# Patient Record
Sex: Female | Born: 1935 | Race: Black or African American | Hispanic: No | Smoking: Former smoker
Health system: Southern US, Community
[De-identification: ages and names within clinical notes are randomized; demographics above are authoritative.]

## PROBLEM LIST (undated history)

## (undated) DIAGNOSIS — K31819 Angiodysplasia of stomach and duodenum without bleeding: Secondary | ICD-10-CM

## (undated) DIAGNOSIS — Q2730 Arteriovenous malformation, site unspecified: Secondary | ICD-10-CM

## (undated) DIAGNOSIS — N3020 Other chronic cystitis without hematuria: Secondary | ICD-10-CM

## (undated) DIAGNOSIS — Z87448 Personal history of other diseases of urinary system: Secondary | ICD-10-CM

## (undated) DIAGNOSIS — M1990 Unspecified osteoarthritis, unspecified site: Secondary | ICD-10-CM

## (undated) DIAGNOSIS — N319 Neuromuscular dysfunction of bladder, unspecified: Secondary | ICD-10-CM

## (undated) DIAGNOSIS — D500 Iron deficiency anemia secondary to blood loss (chronic): Secondary | ICD-10-CM

## (undated) DIAGNOSIS — E559 Vitamin D deficiency, unspecified: Secondary | ICD-10-CM

## (undated) DIAGNOSIS — H01009 Unspecified blepharitis unspecified eye, unspecified eyelid: Secondary | ICD-10-CM

## (undated) DIAGNOSIS — G629 Polyneuropathy, unspecified: Secondary | ICD-10-CM

## (undated) DIAGNOSIS — I739 Peripheral vascular disease, unspecified: Secondary | ICD-10-CM

## (undated) DIAGNOSIS — M7061 Trochanteric bursitis, right hip: Secondary | ICD-10-CM

## (undated) DIAGNOSIS — H40009 Preglaucoma, unspecified, unspecified eye: Secondary | ICD-10-CM

## (undated) DIAGNOSIS — D110 Benign neoplasm of parotid gland: Secondary | ICD-10-CM

## (undated) DIAGNOSIS — N179 Acute kidney failure, unspecified: Secondary | ICD-10-CM

## (undated) DIAGNOSIS — K449 Diaphragmatic hernia without obstruction or gangrene: Secondary | ICD-10-CM

## (undated) DIAGNOSIS — K644 Residual hemorrhoidal skin tags: Secondary | ICD-10-CM

## (undated) DIAGNOSIS — M4804 Spinal stenosis, thoracic region: Secondary | ICD-10-CM

## (undated) DIAGNOSIS — M7918 Myalgia, other site: Secondary | ICD-10-CM

## (undated) DIAGNOSIS — N362 Urethral caruncle: Secondary | ICD-10-CM

## (undated) DIAGNOSIS — K8020 Calculus of gallbladder without cholecystitis without obstruction: Secondary | ICD-10-CM

## (undated) DIAGNOSIS — G894 Chronic pain syndrome: Secondary | ICD-10-CM

## (undated) DIAGNOSIS — R339 Retention of urine, unspecified: Secondary | ICD-10-CM

## (undated) DIAGNOSIS — R079 Chest pain, unspecified: Secondary | ICD-10-CM

## (undated) DIAGNOSIS — R600 Localized edema: Secondary | ICD-10-CM

## (undated) DIAGNOSIS — K5900 Constipation, unspecified: Secondary | ICD-10-CM

## (undated) DIAGNOSIS — I498 Other specified cardiac arrhythmias: Secondary | ICD-10-CM

## (undated) DIAGNOSIS — K921 Melena: Secondary | ICD-10-CM

## (undated) DIAGNOSIS — G5730 Lesion of lateral popliteal nerve, unspecified lower limb: Secondary | ICD-10-CM

## (undated) DIAGNOSIS — N39490 Overflow incontinence: Secondary | ICD-10-CM

## (undated) DIAGNOSIS — Z7409 Other reduced mobility: Secondary | ICD-10-CM

## (undated) DIAGNOSIS — R202 Paresthesia of skin: Secondary | ICD-10-CM

## (undated) DIAGNOSIS — M4715 Other spondylosis with myelopathy, thoracolumbar region: Secondary | ICD-10-CM

## (undated) DIAGNOSIS — G4700 Insomnia, unspecified: Secondary | ICD-10-CM

## (undated) DIAGNOSIS — M48061 Spinal stenosis, lumbar region without neurogenic claudication: Secondary | ICD-10-CM

## (undated) DIAGNOSIS — Z8619 Personal history of other infectious and parasitic diseases: Secondary | ICD-10-CM

## (undated) DIAGNOSIS — K274 Chronic or unspecified peptic ulcer, site unspecified, with hemorrhage: Secondary | ICD-10-CM

## (undated) DIAGNOSIS — M170 Bilateral primary osteoarthritis of knee: Secondary | ICD-10-CM

## (undated) DIAGNOSIS — K648 Other hemorrhoids: Secondary | ICD-10-CM

## (undated) DIAGNOSIS — M160 Bilateral primary osteoarthritis of hip: Secondary | ICD-10-CM

## (undated) DIAGNOSIS — R310 Gross hematuria: Secondary | ICD-10-CM

## (undated) DIAGNOSIS — R413 Other amnesia: Secondary | ICD-10-CM

## (undated) DIAGNOSIS — K5520 Angiodysplasia of colon without hemorrhage: Secondary | ICD-10-CM

## (undated) DIAGNOSIS — M159 Polyosteoarthritis, unspecified: Secondary | ICD-10-CM

## (undated) DIAGNOSIS — N814 Uterovaginal prolapse, unspecified: Secondary | ICD-10-CM

## (undated) DIAGNOSIS — Z9181 History of falling: Secondary | ICD-10-CM

## (undated) DIAGNOSIS — K6381 Dieulafoy lesion of intestine: Secondary | ICD-10-CM

## (undated) DIAGNOSIS — G8929 Other chronic pain: Secondary | ICD-10-CM

## (undated) DIAGNOSIS — Z86010 Personal history of colonic polyps: Secondary | ICD-10-CM

## (undated) DIAGNOSIS — D62 Acute posthemorrhagic anemia: Secondary | ICD-10-CM

## (undated) DIAGNOSIS — A048 Other specified bacterial intestinal infections: Secondary | ICD-10-CM

## (undated) DIAGNOSIS — R9389 Abnormal findings on diagnostic imaging of other specified body structures: Secondary | ICD-10-CM

## (undated) DIAGNOSIS — K635 Polyp of colon: Secondary | ICD-10-CM

## (undated) DIAGNOSIS — E669 Obesity, unspecified: Secondary | ICD-10-CM

## (undated) DIAGNOSIS — K602 Anal fissure, unspecified: Secondary | ICD-10-CM

## (undated) DIAGNOSIS — D649 Anemia, unspecified: Secondary | ICD-10-CM

## (undated) DIAGNOSIS — E785 Hyperlipidemia, unspecified: Secondary | ICD-10-CM

## (undated) DIAGNOSIS — N390 Urinary tract infection, site not specified: Secondary | ICD-10-CM

## (undated) DIAGNOSIS — M549 Dorsalgia, unspecified: Secondary | ICD-10-CM

## (undated) DIAGNOSIS — R9431 Abnormal electrocardiogram [ECG] [EKG]: Secondary | ICD-10-CM

## (undated) DIAGNOSIS — I951 Orthostatic hypotension: Secondary | ICD-10-CM

## (undated) DIAGNOSIS — I248 Other forms of acute ischemic heart disease: Secondary | ICD-10-CM

## (undated) DIAGNOSIS — Z8701 Personal history of pneumonia (recurrent): Secondary | ICD-10-CM

## (undated) DIAGNOSIS — M21859 Other specified acquired deformities of unspecified thigh: Secondary | ICD-10-CM

## (undated) DIAGNOSIS — IMO0002 Reserved for concepts with insufficient information to code with codable children: Secondary | ICD-10-CM

## (undated) DIAGNOSIS — A506 Late congenital syphilis, latent: Secondary | ICD-10-CM

## (undated) DIAGNOSIS — M169 Osteoarthritis of hip, unspecified: Secondary | ICD-10-CM

## (undated) DIAGNOSIS — N904 Leukoplakia of vulva: Secondary | ICD-10-CM

## (undated) DIAGNOSIS — K269 Duodenal ulcer, unspecified as acute or chronic, without hemorrhage or perforation: Secondary | ICD-10-CM

## (undated) DIAGNOSIS — G959 Disease of spinal cord, unspecified: Secondary | ICD-10-CM

## (undated) DIAGNOSIS — I455 Other specified heart block: Secondary | ICD-10-CM

## (undated) DIAGNOSIS — I441 Atrioventricular block, second degree: Secondary | ICD-10-CM

## (undated) DIAGNOSIS — Z9289 Personal history of other medical treatment: Secondary | ICD-10-CM

## (undated) DIAGNOSIS — N3946 Mixed incontinence: Secondary | ICD-10-CM

## (undated) DIAGNOSIS — K922 Gastrointestinal hemorrhage, unspecified: Secondary | ICD-10-CM

## (undated) DIAGNOSIS — M47812 Spondylosis without myelopathy or radiculopathy, cervical region: Secondary | ICD-10-CM

## (undated) DIAGNOSIS — M7071 Other bursitis of hip, right hip: Secondary | ICD-10-CM

## (undated) DIAGNOSIS — M7072 Other bursitis of hip, left hip: Secondary | ICD-10-CM

## (undated) DIAGNOSIS — H903 Sensorineural hearing loss, bilateral: Secondary | ICD-10-CM

## (undated) DIAGNOSIS — R001 Bradycardia, unspecified: Secondary | ICD-10-CM

## (undated) DIAGNOSIS — E7800 Pure hypercholesterolemia, unspecified: Secondary | ICD-10-CM

## (undated) DIAGNOSIS — I10 Essential (primary) hypertension: Secondary | ICD-10-CM

## (undated) DIAGNOSIS — K31811 Angiodysplasia of stomach and duodenum with bleeding: Secondary | ICD-10-CM

## (undated) DIAGNOSIS — M5430 Sciatica, unspecified side: Secondary | ICD-10-CM

## (undated) DIAGNOSIS — R42 Dizziness and giddiness: Secondary | ICD-10-CM

## (undated) DIAGNOSIS — H04129 Dry eye syndrome of unspecified lacrimal gland: Secondary | ICD-10-CM

## (undated) DIAGNOSIS — R29898 Other symptoms and signs involving the musculoskeletal system: Secondary | ICD-10-CM

## (undated) HISTORY — DX: Paresthesia of skin: R20.2

## (undated) HISTORY — DX: Reserved for concepts with insufficient information to code with codable children: IMO0002

## (undated) HISTORY — DX: Chest pain, unspecified: R07.9

## (undated) HISTORY — DX: Other specified bacterial intestinal infections: A04.8

## (undated) HISTORY — DX: Leukoplakia of vulva: N90.4

## (undated) HISTORY — DX: Bilateral primary osteoarthritis of hip: M16.0

## (undated) HISTORY — DX: Other forms of acute ischemic heart disease: I24.8

## (undated) HISTORY — DX: Personal history of colonic polyps: Z86.010

## (undated) HISTORY — DX: Peripheral vascular disease, unspecified: I73.9

## (undated) HISTORY — DX: Other bursitis of hip, left hip: M70.72

## (undated) HISTORY — DX: Insomnia, unspecified: G47.00

## (undated) HISTORY — DX: Other specified heart block: I45.5

## (undated) HISTORY — DX: Gross hematuria: R31.0

## (undated) HISTORY — DX: Acute kidney failure, unspecified: N17.9

## (undated) HISTORY — DX: Disease of spinal cord, unspecified: G95.9

## (undated) HISTORY — DX: Angiodysplasia of stomach and duodenum without bleeding: K31.819

## (undated) HISTORY — DX: Spinal stenosis, lumbar region without neurogenic claudication: M48.061

## (undated) HISTORY — PX: CYSTOCELE REPAIR: SHX163

## (undated) HISTORY — DX: Gastrointestinal hemorrhage, unspecified: K92.2

## (undated) HISTORY — PX: RECTOCELE REPAIR: SHX761

## (undated) HISTORY — DX: Obesity, unspecified: E66.9

## (undated) HISTORY — DX: Hyperlipidemia, unspecified: E78.5

## (undated) HISTORY — DX: Urinary tract infection, site not specified: N39.0

## (undated) HISTORY — DX: Personal history of other infectious and parasitic diseases: Z86.19

## (undated) HISTORY — DX: Iron deficiency anemia secondary to blood loss (chronic): D50.0

## (undated) HISTORY — DX: Unspecified blepharitis unspecified eye, unspecified eyelid: H01.009

## (undated) HISTORY — DX: Spinal stenosis, thoracic region: M48.04

## (undated) HISTORY — DX: Other amnesia: R41.3

## (undated) HISTORY — DX: Benign neoplasm of parotid gland: D11.0

## (undated) HISTORY — DX: Personal history of pneumonia (recurrent): Z87.01

## (undated) HISTORY — DX: Duodenal ulcer, unspecified as acute or chronic, without hemorrhage or perforation: K26.9

## (undated) HISTORY — DX: Atrioventricular block, second degree: I44.1

## (undated) HISTORY — DX: Dry eye syndrome of unspecified lacrimal gland: H04.129

## (undated) HISTORY — DX: Anal fissure, unspecified: K60.2

## (undated) HISTORY — DX: Melena: K92.1

## (undated) HISTORY — DX: Retention of urine, unspecified: R33.9

## (undated) HISTORY — DX: Acute posthemorrhagic anemia: D62

## (undated) HISTORY — DX: Bilateral primary osteoarthritis of knee: M17.0

## (undated) HISTORY — DX: Localized edema: R60.0

## (undated) HISTORY — DX: Diaphragmatic hernia without obstruction or gangrene: K44.9

## (undated) HISTORY — DX: Angiodysplasia of stomach and duodenum with bleeding: K31.811

## (undated) HISTORY — PX: LAMINECTOMY: SHX219

## (undated) HISTORY — DX: Lesion of lateral popliteal nerve, unspecified lower limb: G57.30

## (undated) HISTORY — DX: Vitamin D deficiency, unspecified: E55.9

## (undated) HISTORY — DX: Angiodysplasia of colon without hemorrhage: K55.20

## (undated) HISTORY — DX: Polyneuropathy, unspecified: G62.9

## (undated) HISTORY — DX: History of falling: Z91.81

## (undated) HISTORY — DX: Abnormal electrocardiogram (ECG) (EKG): R94.31

## (undated) HISTORY — DX: Dieulafoy lesion of intestine: K63.81

## (undated) HISTORY — DX: Polyosteoarthritis, unspecified: M15.9

## (undated) HISTORY — DX: Overflow incontinence: N39.490

## (undated) HISTORY — DX: Unspecified osteoarthritis, unspecified site: M19.90

## (undated) HISTORY — DX: Urethral caruncle: N36.2

## (undated) HISTORY — DX: Constipation, unspecified: K59.00

## (undated) HISTORY — PX: BLADDER SUSPENSION: SHX72

## (undated) HISTORY — PX: URETHRAL DILATION: SUR417

## (undated) HISTORY — DX: Dorsalgia, unspecified: M54.9

## (undated) HISTORY — DX: Preglaucoma, unspecified, unspecified eye: H40.009

## (undated) HISTORY — DX: Essential (primary) hypertension: I10

## (undated) HISTORY — DX: Spondylosis without myelopathy or radiculopathy, cervical region: M47.812

## (undated) HISTORY — DX: Osteoarthritis of hip, unspecified: M16.9

## (undated) HISTORY — DX: Other specified acquired deformities of unspecified thigh: M21.859

## (undated) HISTORY — DX: Other chronic pain: G89.29

## (undated) HISTORY — DX: Calculus of gallbladder without cholecystitis without obstruction: K80.20

## (undated) HISTORY — DX: Arteriovenous malformation, site unspecified: Q27.30

## (undated) HISTORY — DX: Trochanteric bursitis, right hip: M70.61

## (undated) HISTORY — DX: Other symptoms and signs involving the musculoskeletal system: R29.898

## (undated) HISTORY — DX: Pure hypercholesterolemia, unspecified: E78.00

## (undated) HISTORY — DX: Anemia, unspecified: D64.9

## (undated) HISTORY — DX: Neuromuscular dysfunction of bladder, unspecified: N31.9

## (undated) HISTORY — DX: Residual hemorrhoidal skin tags: K64.4

## (undated) HISTORY — PX: CARPAL TUNNEL RELEASE: SHX101

## (undated) HISTORY — PX: BREAST LUMPECTOMY: SHX2

## (undated) HISTORY — DX: Bradycardia, unspecified: R00.1

## (undated) HISTORY — DX: Abnormal findings on diagnostic imaging of other specified body structures: R93.89

## (undated) HISTORY — DX: Other specified cardiac arrhythmias: I49.8

## (undated) HISTORY — DX: Other reduced mobility: Z74.09

## (undated) HISTORY — DX: Other hemorrhoids: K64.8

## (undated) HISTORY — DX: Chronic or unspecified peptic ulcer, site unspecified, with hemorrhage: K27.4

## (undated) HISTORY — DX: Orthostatic hypotension: I95.1

## (undated) HISTORY — DX: Sensorineural hearing loss, bilateral: H90.3

---
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
## 1962-12-17 HISTORY — PX: TOTAL ABDOMINAL HYSTERECTOMY W/ BILATERAL SALPINGOOPHORECTOMY: SHX83

## 1988-12-17 HISTORY — PX: PAROTID GLAND TUMOR EXCISION: SHX5221

## 1992-12-17 HISTORY — PX: RECONSTRUCTION OF NOSE: SHX2301

## 1998-10-05 ENCOUNTER — Emergency Department (HOSPITAL_COMMUNITY): Admission: EM | Admit: 1998-10-05 | Discharge: 1998-10-05 | Payer: Self-pay | Admitting: Emergency Medicine

## 2000-06-25 ENCOUNTER — Encounter: Admission: RE | Admit: 2000-06-25 | Discharge: 2000-06-25 | Payer: Self-pay | Admitting: *Deleted

## 2000-07-11 ENCOUNTER — Emergency Department (HOSPITAL_COMMUNITY): Admission: EM | Admit: 2000-07-11 | Discharge: 2000-07-11 | Payer: Self-pay | Admitting: Emergency Medicine

## 2000-09-02 ENCOUNTER — Ambulatory Visit (HOSPITAL_COMMUNITY): Admission: RE | Admit: 2000-09-02 | Discharge: 2000-09-02 | Payer: Self-pay | Admitting: Gastroenterology

## 2000-09-02 ENCOUNTER — Encounter (INDEPENDENT_AMBULATORY_CARE_PROVIDER_SITE_OTHER): Payer: Self-pay

## 2001-11-18 ENCOUNTER — Other Ambulatory Visit: Admission: RE | Admit: 2001-11-18 | Discharge: 2001-11-18 | Payer: Self-pay | Admitting: Internal Medicine

## 2001-11-21 ENCOUNTER — Encounter: Admission: RE | Admit: 2001-11-21 | Discharge: 2001-11-21 | Payer: Self-pay | Admitting: Internal Medicine

## 2002-06-29 ENCOUNTER — Encounter: Admission: RE | Admit: 2002-06-29 | Discharge: 2002-06-29 | Payer: Self-pay | Admitting: Internal Medicine

## 2002-06-30 ENCOUNTER — Encounter: Admission: RE | Admit: 2002-06-30 | Discharge: 2002-06-30 | Payer: Self-pay | Admitting: Internal Medicine

## 2004-02-29 ENCOUNTER — Ambulatory Visit (HOSPITAL_COMMUNITY): Admission: RE | Admit: 2004-02-29 | Discharge: 2004-02-29 | Payer: Self-pay | Admitting: Internal Medicine

## 2004-04-05 ENCOUNTER — Emergency Department (HOSPITAL_COMMUNITY): Admission: EM | Admit: 2004-04-05 | Discharge: 2004-04-05 | Payer: Self-pay | Admitting: Emergency Medicine

## 2004-05-04 ENCOUNTER — Encounter: Admission: RE | Admit: 2004-05-04 | Discharge: 2004-05-04 | Payer: Self-pay | Admitting: Internal Medicine

## 2004-12-17 DIAGNOSIS — Z860100 Personal history of colon polyps, unspecified: Secondary | ICD-10-CM | POA: Insufficient documentation

## 2004-12-17 HISTORY — PX: COLONOSCOPY W/ POLYPECTOMY: SHX1380

## 2005-02-27 ENCOUNTER — Other Ambulatory Visit: Admission: RE | Admit: 2005-02-27 | Discharge: 2005-02-27 | Payer: Self-pay | Admitting: Internal Medicine

## 2005-03-07 ENCOUNTER — Ambulatory Visit (HOSPITAL_COMMUNITY): Admission: RE | Admit: 2005-03-07 | Discharge: 2005-03-07 | Payer: Self-pay | Admitting: Gastroenterology

## 2005-03-07 ENCOUNTER — Encounter (INDEPENDENT_AMBULATORY_CARE_PROVIDER_SITE_OTHER): Payer: Self-pay | Admitting: *Deleted

## 2005-03-08 ENCOUNTER — Ambulatory Visit (HOSPITAL_COMMUNITY): Admission: RE | Admit: 2005-03-08 | Discharge: 2005-03-08 | Payer: Self-pay | Admitting: Internal Medicine

## 2006-03-12 ENCOUNTER — Ambulatory Visit (HOSPITAL_COMMUNITY): Admission: RE | Admit: 2006-03-12 | Discharge: 2006-03-12 | Payer: Self-pay | Admitting: Internal Medicine

## 2006-12-17 LAB — CONVERTED CEMR LAB: Pap Smear: NORMAL

## 2007-03-14 ENCOUNTER — Ambulatory Visit (HOSPITAL_COMMUNITY): Admission: RE | Admit: 2007-03-14 | Discharge: 2007-03-14 | Payer: Self-pay | Admitting: Internal Medicine

## 2007-06-11 ENCOUNTER — Encounter: Admission: RE | Admit: 2007-06-11 | Discharge: 2007-06-11 | Payer: Self-pay | Admitting: Internal Medicine

## 2007-09-03 ENCOUNTER — Emergency Department (HOSPITAL_COMMUNITY): Admission: EM | Admit: 2007-09-03 | Discharge: 2007-09-03 | Payer: Self-pay | Admitting: Emergency Medicine

## 2007-09-11 ENCOUNTER — Encounter: Admission: RE | Admit: 2007-09-11 | Discharge: 2007-09-11 | Payer: Self-pay | Admitting: Sports Medicine

## 2007-09-11 DIAGNOSIS — M1610 Unilateral primary osteoarthritis, unspecified hip: Secondary | ICD-10-CM

## 2007-09-11 DIAGNOSIS — M160 Bilateral primary osteoarthritis of hip: Secondary | ICD-10-CM

## 2007-09-11 DIAGNOSIS — M21859 Other specified acquired deformities of unspecified thigh: Secondary | ICD-10-CM

## 2007-09-11 DIAGNOSIS — M169 Osteoarthritis of hip, unspecified: Secondary | ICD-10-CM

## 2007-09-11 HISTORY — DX: Unilateral primary osteoarthritis, unspecified hip: M16.10

## 2007-09-11 HISTORY — DX: Bilateral primary osteoarthritis of hip: M16.0

## 2007-09-11 HISTORY — DX: Other specified acquired deformities of unspecified thigh: M21.859

## 2007-09-11 HISTORY — DX: Osteoarthritis of hip, unspecified: M16.9

## 2007-09-29 ENCOUNTER — Encounter: Admission: RE | Admit: 2007-09-29 | Discharge: 2007-09-29 | Payer: Self-pay | Admitting: Sports Medicine

## 2008-03-16 ENCOUNTER — Ambulatory Visit (HOSPITAL_COMMUNITY): Admission: RE | Admit: 2008-03-16 | Discharge: 2008-03-16 | Payer: Self-pay | Admitting: Internal Medicine

## 2008-04-29 ENCOUNTER — Encounter: Payer: Self-pay | Admitting: Family Medicine

## 2008-04-29 LAB — CONVERTED CEMR LAB
Cholesterol: 204 mg/dL
Direct LDL: 131 mg/dL
HDL: 53 mg/dL
Total CHOL/HDL Ratio: 3.85
Triglycerides: 125 mg/dL

## 2008-12-06 ENCOUNTER — Encounter: Payer: Self-pay | Admitting: Family Medicine

## 2008-12-17 HISTORY — PX: CARPAL TUNNEL RELEASE: SHX101

## 2008-12-17 HISTORY — PX: CATARACT EXTRACTION W/ INTRAOCULAR LENS IMPLANT: SHX1309

## 2009-04-08 ENCOUNTER — Ambulatory Visit (HOSPITAL_BASED_OUTPATIENT_CLINIC_OR_DEPARTMENT_OTHER): Admission: RE | Admit: 2009-04-08 | Discharge: 2009-04-08 | Payer: Self-pay | Admitting: Orthopedic Surgery

## 2009-04-26 ENCOUNTER — Encounter: Payer: Self-pay | Admitting: Family Medicine

## 2009-04-26 LAB — CONVERTED CEMR LAB
Alkaline Phosphatase: 66 units/L
BUN: 10 mg/dL
CO2: 28 meq/L
Cholesterol: 180 mg/dL
Creatinine, Ser: 0.7 mg/dL
GFR calc non Af Amer: 82 mL/min
Total CHOL/HDL Ratio: 3.46
Triglycerides: 112 mg/dL

## 2009-05-03 ENCOUNTER — Ambulatory Visit (HOSPITAL_COMMUNITY): Admission: RE | Admit: 2009-05-03 | Discharge: 2009-05-03 | Payer: Self-pay | Admitting: Internal Medicine

## 2009-10-18 ENCOUNTER — Encounter: Admission: RE | Admit: 2009-10-18 | Discharge: 2009-10-18 | Payer: Self-pay | Admitting: *Deleted

## 2010-01-02 ENCOUNTER — Encounter: Payer: Self-pay | Admitting: Family Medicine

## 2010-03-01 ENCOUNTER — Encounter: Payer: Self-pay | Admitting: Family Medicine

## 2010-03-01 LAB — CONVERTED CEMR LAB
Eosinophils Relative: 1.9 %
HCT: 37.5 %
Hemoglobin: 12.3 g/dL
Lymphocytes Relative: 40.6 %
Lymphs Abs: 2.3 10*3/uL
Monocytes Absolute: 0.4 10*3/uL
Platelets: 267 10*3/uL
TSH: 2.54 microintl units/mL
Urine culture (CF units/mL): NO GROWTH CFunits/mL
WBC: 5.7 10*3/uL

## 2010-04-17 ENCOUNTER — Ambulatory Visit: Payer: Self-pay | Admitting: Family Medicine

## 2010-04-18 DIAGNOSIS — M549 Dorsalgia, unspecified: Secondary | ICD-10-CM

## 2010-04-18 DIAGNOSIS — G4700 Insomnia, unspecified: Secondary | ICD-10-CM

## 2010-04-18 DIAGNOSIS — G8929 Other chronic pain: Secondary | ICD-10-CM

## 2010-04-18 DIAGNOSIS — I10 Essential (primary) hypertension: Secondary | ICD-10-CM

## 2010-04-18 HISTORY — DX: Other chronic pain: G89.29

## 2010-04-18 HISTORY — DX: Insomnia, unspecified: G47.00

## 2010-04-18 HISTORY — DX: Essential (primary) hypertension: I10

## 2010-04-18 HISTORY — DX: Dorsalgia, unspecified: M54.9

## 2010-04-21 DIAGNOSIS — M4715 Other spondylosis with myelopathy, thoracolumbar region: Secondary | ICD-10-CM

## 2010-04-21 DIAGNOSIS — M1990 Unspecified osteoarthritis, unspecified site: Secondary | ICD-10-CM

## 2010-04-21 HISTORY — DX: Other spondylosis with myelopathy, thoracolumbar region: M47.15

## 2010-04-21 HISTORY — DX: Unspecified osteoarthritis, unspecified site: M19.90

## 2010-05-10 ENCOUNTER — Encounter: Payer: Self-pay | Admitting: Family Medicine

## 2010-05-10 DIAGNOSIS — E7800 Pure hypercholesterolemia, unspecified: Secondary | ICD-10-CM

## 2010-05-10 DIAGNOSIS — E785 Hyperlipidemia, unspecified: Secondary | ICD-10-CM

## 2010-05-10 HISTORY — DX: Hyperlipidemia, unspecified: E78.5

## 2010-05-10 HISTORY — DX: Pure hypercholesterolemia, unspecified: E78.00

## 2010-05-16 ENCOUNTER — Telehealth: Payer: Self-pay | Admitting: Family Medicine

## 2010-05-23 ENCOUNTER — Ambulatory Visit (HOSPITAL_COMMUNITY): Admission: RE | Admit: 2010-05-23 | Discharge: 2010-05-23 | Payer: Self-pay | Admitting: Internal Medicine

## 2010-06-12 ENCOUNTER — Ambulatory Visit: Payer: Self-pay | Admitting: Family Medicine

## 2010-06-12 ENCOUNTER — Telehealth: Payer: Self-pay | Admitting: Family Medicine

## 2010-07-10 ENCOUNTER — Ambulatory Visit: Payer: Self-pay | Admitting: Family Medicine

## 2010-07-10 LAB — CONVERTED CEMR LAB
Bilirubin Urine: NEGATIVE
Blood in Urine, dipstick: NEGATIVE
Ketones, urine, test strip: NEGATIVE
Nitrite: NEGATIVE
Protein, U semiquant: NEGATIVE
pH: 6.5

## 2010-07-11 ENCOUNTER — Encounter: Payer: Self-pay | Admitting: Family Medicine

## 2010-07-24 ENCOUNTER — Ambulatory Visit: Payer: Self-pay | Admitting: Family Medicine

## 2010-07-31 ENCOUNTER — Ambulatory Visit: Payer: Self-pay | Admitting: Family Medicine

## 2010-07-31 ENCOUNTER — Telehealth: Payer: Self-pay | Admitting: Family Medicine

## 2010-07-31 LAB — CONVERTED CEMR LAB
Bilirubin Urine: NEGATIVE
Glucose, Urine, Semiquant: NEGATIVE
Ketones, urine, test strip: NEGATIVE
Protein, U semiquant: NEGATIVE
Specific Gravity, Urine: 1.02
WBC, UA: >20 cells/hpf
pH: 5.5

## 2010-08-04 ENCOUNTER — Telehealth: Payer: Self-pay | Admitting: Family Medicine

## 2010-08-08 ENCOUNTER — Encounter: Payer: Self-pay | Admitting: Family Medicine

## 2010-08-09 ENCOUNTER — Telehealth: Payer: Self-pay | Admitting: Family Medicine

## 2010-08-11 ENCOUNTER — Encounter: Payer: Self-pay | Admitting: Family Medicine

## 2010-09-27 ENCOUNTER — Telehealth: Payer: Self-pay | Admitting: Family Medicine

## 2010-10-24 ENCOUNTER — Telehealth: Payer: Self-pay | Admitting: Family Medicine

## 2010-11-06 ENCOUNTER — Ambulatory Visit: Payer: Self-pay | Admitting: Family Medicine

## 2010-11-06 LAB — CONVERTED CEMR LAB
BUN: 13 mg/dL (ref 6–23)
Calcium: 9 mg/dL (ref 8.4–10.5)
Chloride: 107 meq/L (ref 96–112)
Creatinine, Ser: 0.8 mg/dL (ref 0.40–1.20)
Hemoglobin: 11.8 g/dL — ABNORMAL LOW (ref 12.0–15.0)
MCHC: 32.4 g/dL (ref 30.0–36.0)
RBC: 4.09 M/uL (ref 3.87–5.11)
RDW: 12.9 % (ref 11.5–15.5)
TSH: 1.429 microintl units/mL (ref 0.350–4.500)

## 2010-11-07 ENCOUNTER — Encounter: Payer: Self-pay | Admitting: Family Medicine

## 2010-11-30 ENCOUNTER — Ambulatory Visit: Payer: Self-pay | Admitting: Family Medicine

## 2010-11-30 ENCOUNTER — Encounter: Payer: Self-pay | Admitting: Family Medicine

## 2010-11-30 LAB — CONVERTED CEMR LAB
Cholesterol: 156 mg/dL (ref 0–200)
Triglycerides: 85 mg/dL (ref <150)
VLDL: 17 mg/dL (ref 0–40)

## 2010-12-01 ENCOUNTER — Encounter: Payer: Self-pay | Admitting: Family Medicine

## 2010-12-25 ENCOUNTER — Telehealth: Payer: Self-pay | Admitting: Family Medicine

## 2011-01-01 ENCOUNTER — Encounter: Payer: Self-pay | Admitting: Family Medicine

## 2011-01-01 ENCOUNTER — Ambulatory Visit
Admission: RE | Admit: 2011-01-01 | Discharge: 2011-01-01 | Payer: Self-pay | Source: Home / Self Care | Attending: Family Medicine | Admitting: Family Medicine

## 2011-01-01 LAB — CONVERTED CEMR LAB
Bilirubin Urine: NEGATIVE
Ketones, urine, test strip: NEGATIVE
Nitrite: NEGATIVE
Protein, U semiquant: NEGATIVE
Urobilinogen, UA: 0.2

## 2011-01-03 ENCOUNTER — Telehealth: Payer: Self-pay | Admitting: Family Medicine

## 2011-01-07 ENCOUNTER — Encounter: Payer: Self-pay | Admitting: Internal Medicine

## 2011-01-11 ENCOUNTER — Encounter: Payer: Self-pay | Admitting: Family Medicine

## 2011-01-11 ENCOUNTER — Ambulatory Visit: Admission: RE | Admit: 2011-01-11 | Discharge: 2011-01-11 | Payer: Self-pay | Source: Home / Self Care

## 2011-01-16 NOTE — Progress Notes (Signed)
Summary: Vicodin  Refill Request  Phone Note Refill Request Call back at [phone] Message from:  DAUGHTER-DELLA NEAL  Refills Requested: Medication #1:  HYDROCODONE-ACETAMINOPHEN 5-500 MG TABS 2 tablets by mouth twice a day as needed. [facility].  Initial call taken by: Clydell Hakim,  May 16, 2010 11:07 AM    Prescriptions: HYDROCODONE-ACETAMINOPHEN 5-500 MG TABS (HYDROCODONE-ACETAMINOPHEN) 2 tablets by mouth twice a day as needed  #60 x 1   Entered and Authorized by:   Tawanna Cooler Mercy Malena MD   Signed by:   Tawanna Cooler Juventino Pavone MD on 05/17/2010   Method used:   Telephoned to ...       [facility] [id number] (retail)       [address] [place], [place]  [address]       Ph: [phone]       Fax: [phone]   RxID:   [id number]

## 2011-01-16 NOTE — Progress Notes (Signed)
Summary: triage  Phone Note Call from Patient Call back at Home Phone [phone]   Caller: Patient Summary of Call: Still having pain in her side even after being seen several tiimes. Initial call taken by: Clydell Hakim,  August 04, 2010 11:16 AM  Follow-up for Phone Call        right back pain improved after injection initially but has now returned.  She agreed to go see the orthopedists [facility] who have helped her with her  back pain before. Will arrange consultation.  Follow-up by: Tawanna Cooler McDiarmid MD,  August 04, 2010 12:04 PM

## 2011-01-16 NOTE — Consult Note (Signed)
Summary: [facility]   Imported By: De Nurse 08/29/2010 16:04:18  _____________________________________________________________________  External Attachment:    Type:   Image     Comment:   External Document

## 2011-01-16 NOTE — Assessment & Plan Note (Signed)
Summary: np/daughter della is pt of Jersey Espinoza/eo   Vital Signs:  Patient profile:   77 year old female Height:      59.0 inches Weight:      160.5 pounds BMI:     32.53 Temp:     98.1 degrees F Pulse rate:   82 / minute BP sitting:   140 / 84  (right arm)  Vitals Entered By: Starleen Blue RN 5/2 2011 CC: New patient establishing care with Dr Perley Jain Is Patient Diabetic? No Pain Assessment Patient in pain? yes     Location: back Intensity: 7   CC:  New patient establishing care with Dr Perley Jain.  History of Present Illness: Mrs Tagliaferri is a 77 year old woman accompanied by her dgt, [name], making an office visit to establish care with Dr Janelli Welling.   Ms Osuch is concerned about generalized fatigue which she believes may be related to her daily difficulty with sleeping.  Insomnia This is a longstanding problem for patient that has worsened since the death of her husband 1 to 2 years ago.   She lays down in her bedroom aroung 10pm but does not fall asleep until 4 to 5am.  She then sleeps to around 8am unless she has to get up sooner to care for a grandchild.  Her prior primary physician, Dr Rene Paci, at [facility] tried her on Cymbalta, presumably for her complaint of fatigue, chronic back pain, and perhaps mood-related insomnia.  Mrs Jester did not feel this medication helped. Only occassionally does her chronic back pain bother her significantly at bedtime.  She takes a hydrocodone/APAP tablet which relieves the pain without helping her fall asleep.  She denies shortness of breath or other discomforts during bedtime.  She gets up to urinate only once a night on average.  She denies feeling sleepy during the daytime.  She denies falling asleep if she is sedentary during day. She only occassionally takes short daytime naps.  She lives alone.  She does not know if she snores.  Her daughter recalls that her mother did snore when she lived at home many years ago. She does not  recall jerking awake from her sleep.  She notes that her bed linen and  covers are hardly disturbed when she does arise from bed.  Mrs Coste describes her mind as staying very active when she is trying to fall asleep. She does not have a TV/Computer monitor in her bedroom.  She does get out of bed to read or do light housework when she does not fall asleep. She has not tried any prescription, OTC or herbal medication to help her sleep other than the trial of Cymbalta described above.     Patient was diagnosed with Pneumonia about 5 weeks ago and treated with Azithromycin.  She feels better now than she did a few weeks ago.  Habits & Providers  Alcohol-Tobacco-Diet     Tobacco Status: quit > 6 months  Current Medications (verified): 1)  Ambien 5 Mg Tabs (Zolpidem Tartrate) .... One Tablet By Mouth 30 Minutes Before Bedtime 2)  Diltiazem Hcl Coated Beads 240 Mg Xr24h-Cap (Diltiazem Hcl Coated Beads) .Marland Kitchen.. 1 Capsule By Mouth Daily 3)  Hydrocodone-Acetaminophen 5-500 Mg Tabs (Hydrocodone-Acetaminophen) .... 2 Tablets By Mouth Twice A Day As Needed  Allergies (verified): 1)  * Estrogen  Past History:  Past Medical History: Hypertension, essential Chronic nonspecific low back pain without radiculopathy that bagan after struck by [facility] in 1995. Preauricular mass, right, followed by  Dr Narda Bonds. 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 Surgery complicated by nerve damage resulting in difficulty raising right eyebrow (? CN VII) Carpel Tunnel Release 2010 [facility] [place]) Left Cataract removal with lens implant  Lumpectomy of Breast bilaterally, Dr Lurene Shadow Spine surgery, lumbar for "pinched nerve"  Family History: Heart Disease in Mother or Sister Heart Disease in  Father or Brother No Cancers Daugter with DMT2 Father died age 33 Mother died age 26  Social History: Widowed in 2009 Lives alone. [name], [name], involved in her care Owns a car Owns a house Quit smoking 1991, No alcohol, no illicit drugs Walks and gardens for exercise.Smoking Status:  quit > 6 months  Review of Systems CV:  Denies palpitations. Resp:  Denies hypersomnolence. GI:  Denies constipation, diarrhea, and indigestion. GU:  Denies dysuria.  Physical Exam  General:  Well-developed,well-nourished,in no acute distress; alert,appropriate and cooperative throughout examination Head:  Linear fluctuant mass right preauricular extending along zygomatic arch  ~ 4 cm in length and 1-1.5 cm width. Nontender, no overlying erythema Palpable mobile nontender right preauricular node  ~ 5-8 mm diameter.  Eyes:  Right lens cataract Ears:  R ear normal and L ear normal.   TM's translucent bilaterally Nose:  Nasal bridge scarring Neck:  supple, full ROM, no masses, no thyromegaly, and no thyroid nodules or tenderness.   Lungs:  normal respiratory effort, no intercostal retractions, no accessory muscle use, and normal breath sounds.   Heart:  normal rate, regular rhythm, no murmur, and no JVD.  Normal PMI Msk:  Tender over lumbar spine  Tender over right SI joint and soft tissue caudal to posterior iliac crest.  Pulses:  R dorsalis pedis normal and L dorsalis pedis normal.   Extremities:  trace left pedal edema and trace right pedal edema.   Neurologic:  No DTR at knees and ankles bilaterally symmetric.   able to climb up on exam table without difficulty.  Normal gait.  Psych:  normally interactive, good eye contact, not anxious appearing, and not depressed appearing.  Well-groomed.   Impression & Recommendations:  Problem # 1:  INSOMNIA, CHRONIC (ICD-307.42) Assessment New Longstanding issue for patient.  May have worsened with death of spouse in last 1-2 years which would  favor a psychophysiologic origin, though there may be a mismatch between the patient's perception of the duration of sleep and her actual sleep pattern.  No overt historical findings of an organic origin to insomnia complaint, except for distant history of snoring. No clear evidence of Restless Leg Syndrome. On follow up visit, will check Epworth Sleep Scale, Geriatric Depression Scale and Restless Leg Questionnaire and ask about caffeine intake.  Will request Release of Information from [facility]. Reviewed sleep hygiene, including going to bed only when sleepy, getting out of bedroom if no sleep onset  within 20 minutes, Avoid daytime napping or limit it to less than 30 minutes a day. Avoid caffeine and OTC medications.  Keep waking time constant and make bedtime later, with progressive moving upof bedtime as sleep improves.  exercise regularly, but not three hours before bedtime  Given patient's appears to have difficulty falling asleep, recommended a short trial of Zolpidem 5 mg by mouth 30 minutes before bedtime.  Problem # 2:  BACK PAIN, CHRONIC (ICD-724.5) Assessment: Comment  Only Not worse than usual per patient Her updated medication list for this problem includes:    Hydrocodone-acetaminophen 5-500 Mg Tabs (Hydrocodone-acetaminophen) .Marland Kitchen... 2 tablets by mouth twice a day as needed  Problem # 3:  ESSENTIAL HYPERTENSION, BENIGN (ICD-401.1) Adequate control. Tolerating medication. No new organ damage. Plan to continue current medication.  Her updated medication list for this problem includes:    Diltiazem Hcl Coated Beads 240 Mg Xr24h-cap (Diltiazem hcl coated beads) .Marland Kitchen... 1 capsule by mouth daily  Problem # 4:  Preventive Health Care (ICD-V70.0) Need to find out date of last mammogram and date of pneumonvax. Has Patient had Zostavax. Get latest Lipid tests from [facility]. Screen for DMT2 if not done recently at [facility].   Complete Medication List: 1)  Ambien 5 Mg  Tabs (Zolpidem tartrate) .... One tablet by mouth 30 minutes before bedtime 2)  Diltiazem Hcl Coated Beads 240 Mg Xr24h-cap (Diltiazem hcl coated beads) .Marland Kitchen.. 1 capsule by mouth daily 3)  Hydrocodone-acetaminophen 5-500 Mg Tabs (Hydrocodone-acetaminophen) .... 2 tablets by mouth twice a day as needed  Patient Instructions: 1)  Please schedule a follow-up appointment in 2 months for CPE.  Prescriptions: AMBIEN 5 MG TABS (ZOLPIDEM TARTRATE) One tablet by mouth 30 minutes before bedtime  #15 x 0   Entered and Authorized by:   Tawanna Cooler Margarite Vessel MD   Signed by:   Tawanna Cooler Kailah Pennel MD on 04/17/2010   Method used:   Printed then faxed to ...       [facility] [id number] (retail)       [address]       [place], [place]  [address]       Ph: [phone]       Fax: [phone]   RxID:   [id number]     Flex Sig Next Due:  Not Indicated Colonoscopy Result Date:  12/18/2007 Colonoscopy Result:  normal Hemoccult Next Due:  Not Indicated PAP Result Date:  12/17/2006 PAP Result:  normal   Prevention & Chronic Care Immunizations   Influenza vaccine: Not documented    Tetanus booster: Not documented    Pneumococcal vaccine: Not documented    H. zoster vaccine: Not documented  Colorectal Screening   Hemoccult: Not documented   Hemoccult due: Not Indicated    Colonoscopy: normal  (12/18/2007)   Colonoscopy due: 12/17/2017  Other Screening   Pap smear: normal  (12/17/2006)   Pap smear due: 12/18/2007    Mammogram: Not documented    DXA bone density scan: Not documented   Smoking status: quit > 6 months  (04/17/2010)  Lipids   Total Cholesterol: Not documented   LDL: Not documented   LDL Direct: Not documented   HDL: Not documented   Triglycerides: Not documented  Hypertension   Last Blood Pressure: 140 / 84  (04/17/2010)   Serum creatinine: Not documented   Serum potassium Not documented    Hypertension flowsheet reviewed?: Yes   Progress  toward BP goal: At goal  Self-Management Support :   Personal Goals (by the next clinic visit) :      Personal blood pressure goal: 140/90  (04/17/2010)   Hypertension self-management support: Not documented    Hypertension self-management support not done because: Good outcomes  (04/17/2010)   Appended Document: np/daughter della is pt of Lecretia Buczek/eo    Clinical Lists Changes  Problems: Added new problem of SPINAL STENOSIS, LUMBAR, DIFFUSE, MAXIMAL AT L2-3 (ICD-724.02) - Diffuse  Added new problem of DEGENERATIVE JOINT DISEASE, AXIAL SKELETON (ICD-715.90) Added new problem of DEGENERATIVE JOINT DISEASE, RIGHT HIP BY MRI (ICD-715.95) - associated right hip anterolateral  labral teat Added new problem of ANTEROLATERAL ACETABULAR LABRAL TEAR BY MRI (ICD-736.39) Observations: Added new observation of PAP DUE: Not Indicated (04/21/2010 7:46) Added new observation of MAMMO DUE: 05/04/2011 (04/21/2010 7:46) Added new observation of ORTHOPEDMD: [facility], Dr(s) Wang & Dumonski (04/21/2010 7:46) Added new observation of PAST MED HX: Hypertension, essential Chronic nonspecific low back pain without radiculopathy that bagan after struck by [facility] in 1995. Spinal Stenosis, Lumbar, diffuse thruoughout lumbar spine, maximal at L2-3 by MRI 11/10 (followed by Dr Estill B[facility]berg at [facility]) Spinal Stenosis, Thoracic, maximal at  T10 -T11 by MRI 11/10 Spondylolisthesis, L4-5 by MRI 11/10.  Foraminal stenosis, bilaterally at L4 and at L5 by MRI 11/10 Foraminal stenosis, right, T11 by MRI 11/10 S/P L3-4, L4-5 facet joint intra-articular injection, Dr Claria Dice [facility]) Preauricular mass, right, followed by Dr Narda Bonds. 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 Surgery complicated by nerve damage resulting in difficulty raising right eyebrow (? CN VII) Carpel Tunnel Release 2010 [facility]) Left Cataract removal with lens implant  Lumpectomy of Breast bilaterally, Dr Lurene Shadow S/P L4-5 Laminectomy    (04/21/2010 7:46) Added new observation of MRI: MR L Spine Without Contrast                                    Perform Date: 2 Nov10 12:28  Ordered By: Serita Sheller,            Clinical Data: 77 year old female with low back pain.  No known   injury. Comparison: CT abdomen pelvis 06/11/2007.    Findings: Small renal cystic lesions are stable from the comparison  CT. Visualized paraspinal soft tissues are within normal limits.  Chronic spondylolisthesis in the lumbar spine is most pronounced at  L4-L5 (grade 1).  There are chronic advanced degenerative endplate  changes at T10-T11, L1-L2, L2-L3, and L5-S1.  The latter two levels  are the worst, and the changes L5-S1 appear progressed since the  prior CT.  Superimposed stable benign T11 hemangioma. There is mild  marrow edema at the L5-L1 level, favor discogenic and degenerative.   Multilevel chronic significant spinal stenosis results as follows:   T9-T10:  Grossly negative.   T10-T11:  Moderate spinal stenosis due to combined lobulated   circumferential disc protrusion and facet/ligament flavum   hypertrophy.  Severe right T10 foraminal stenosis.  Overall, the   degree of stenosis appears stable to the prior CT.  A small low   density focus in the spinal canal just above the right facet joint   is felt to represent partial volume artifact of the facet, rather   than a synovial cyst.   T11-T12:  Mild spinal stenosis due to combined circumferential disc  bulge and severe facet and ligament flavum hypertrophy.  Severe  right T11 foraminal stenosis.  The degree  of stenosis appears  stable.  T12-L1:  Moderate facet and ligament flavum hypertrophy.  No   significant  stenosis.   L1-L2:  Very severe spinal stenosis related to combined   circumferential disc osteophyte complex and severe facet and   ligament flavum hypertrophy.  Thecal sac is virtually occluded.   Redundant nerve roots are bunched in the cephalad aspect of the  thecal sac.  No significant foraminal stenosis.  The appearance is  grossly stable to the prior CT.     (10/18/2009 7:53) Added new observation of LAST MAM DAT: 05/03/2009 (05/03/2009 8:12) Added new observation of MAMMOGRAM: normal (05/03/2009 8:12)       MRI EXAM  Procedure date:  10/18/2009  Findings:      MR L Spine Without Contrast                                    Perform Date: 2 Nov10 12:28  Ordered By: Serita Sheller,            Clinical Data: 77 year old female with low back pain.  No known   injury. Comparison: CT abdomen pelvis 06/11/2007.    Findings: Small renal cystic lesions are stable from the comparison  CT. Visualized paraspinal soft tissues are within normal limits.  Chronic spondylolisthesis in the lumbar spine is most pronounced at  L4-L5 (grade 1).  There are chronic advanced degenerative endplate  changes at T10-T11, L1-L2, L2-L3, and L5-S1.  The latter two levels  are the worst, and the changes L5-S1 appear progressed since the  prior CT.  Superimposed stable benign T11 hemangioma. There is mild  marrow edema at the L5-L1 level, favor discogenic and degenerative.   Multilevel chronic significant spinal stenosis results as follows:   T9-T10:  Grossly negative.   T10-T11:  Moderate spinal stenosis due to combined lobulated   circumferential disc protrusion and facet/ligament flavum   hypertrophy.  Severe right T10 foraminal stenosis.  Overall, the   degree of stenosis appears stable to the prior CT.  A small low   density focus in the spinal canal just above the right facet joint   is felt  to represent partial volume artifact of the facet, rather   than a synovial cyst.   T11-T12:  Mild spinal stenosis due to combined circumferential disc  bulge and severe facet and ligament flavum hypertrophy.  Severe  right T11 foraminal stenosis.  The degree of stenosis appears  stable.  T12-L1:  Moderate facet and ligament flavum hypertrophy.  No   significant stenosis.   L1-L2:  Very severe spinal stenosis related to combined   circumferential disc osteophyte complex and severe facet and   ligament flavum hypertrophy.  Thecal sac is virtually occluded.   Redundant nerve roots are bunched in the cephalad aspect of the  thecal sac.  No significant foraminal stenosis.  The appearance is  grossly stable to the prior CT.      Comments:      L2-L3:  Extremely severe spinal stenosis related to circumferential   disc osteophyte complex and severe facet and ligament flavum   hypertrophy.  Thecal sac virtually occluded.  Mild bilateral L2   foraminal stenosis.  The appearance is grossly stable from the   prior CT.   L3-L4:  Very severe spinal stenosis due to combined circumferential  disc bulge and severe facet and ligament flavum hypertrophy.  Thecal sac virtually occluded.  No significant foraminal stenosis.  Grossly stable appearance from the prior CT.   L4-L5:  Sequelae  of previous partial laminectomy.  Very severe   facet degeneration.  Broad-based pseudo disc protrusion.  These are  related to the chronic spondylolisthesis.  There is mild spinal   stenosis (primarily due to lateral mass effect from the facet) with   severe bilateral lateral recess stenosis, and moderate to severe   bilateral L4 foraminal stenosis.  The appearance is grossly stable.    L5-S1:  Very severe left greater than right facet hypertrophy.   Broad-based disc osteophyte complex.  Severe bilateral lateral   recess stenosis without significant spinal stenosis.  Moderate to   severe bilateral L5 foraminal stenosis  (worse on the left).   Appearance is grossly stable.    IMPRESSION:   1.  Diffuse, very severe spinal stenosis throughout the lumbar   spine, maximal at L2-L3 as detailed above.  Findings appear   chronic, and overall not significantly changed since the CT of the   abdomen and pelvis dated 06/11/2007.   2.  Postoperative changes at L4-L5 with chronic spondylolisthesis   and severe facet arthropathy at that level.  Mild spinal stenosis   with severe bilateral lateral recess stenosis.   3.  Mild and moderate spinal stenosis in the visualized lower   thoracic spine, maximal at T10-T11.  No spinal cord signal   abnormality.   4.  Multilevel moderate and severe neural foraminal stenosis in the   lower thoracic spine and lower lumbar spine as above.     MRI EXAM  Procedure date:  10/18/2009  Findings:      MR L Spine Without Contrast                                    Perform Date: 2 Nov10 12:28  Ordered By: Serita Sheller,            Clinical Data: 76 year old female with low back pain.  No known   injury. Comparison: CT abdomen pelvis 06/11/2007.    Findings: Small renal cystic lesions are stable from the comparison  CT. Visualized paraspinal soft tissues are within normal limits.  Chronic spondylolisthesis in the lumbar spine is most pronounced at  L4-L5 (grade 1).  There are chronic advanced degenerative endplate  changes at T10-T11, L1-L2, L2-L3, and L5-S1.  The latter two levels  are the worst, and the changes L5-S1 appear progressed since the  prior CT.  Superimposed stable benign T11 hemangioma. There is mild  marrow edema at the L5-L1 level, favor discogenic and degenerative.   Multilevel chronic significant spinal stenosis results as follows:   T9-T10:  Grossly negative.   T10-T11:  Moderate spinal stenosis due to combined lobulated   circumferential disc protrusion and facet/ligament flavum   hypertrophy.  Severe right T10 foraminal stenosis.  Overall, the   degree of  stenosis appears stable to the prior CT.  A small low   density focus in the spinal canal just above the right facet joint   is felt to represent partial volume artifact of the facet, rather   than a synovial cyst.   T11-T12:  Mild spinal stenosis due to combined circumferential disc  bulge and severe facet and ligament flavum hypertrophy.  Severe  right T11 foraminal stenosis.  The degree of stenosis appears  stable.  T12-L1:  Moderate facet and ligament flavum hypertrophy.  No   significant stenosis.   L1-L2:  Very severe spinal stenosis related  to combined   circumferential disc osteophyte complex and severe facet and   ligament flavum hypertrophy.  Thecal sac is virtually occluded.   Redundant nerve roots are bunched in the cephalad aspect of the  thecal sac.  No significant foraminal stenosis.  The appearance is  grossly stable to the prior CT.      Comments:      L2-L3:  Extremely severe spinal stenosis related to circumferential   disc osteophyte complex and severe facet and ligament flavum   hypertrophy.  Thecal sac virtually occluded.  Mild bilateral L2   foraminal stenosis.  The appearance is grossly stable from the   prior CT.   L3-L4:  Very severe spinal stenosis due to combined circumferential  disc bulge and severe facet and ligament flavum hypertrophy.  Thecal sac virtually occluded.  No significant foraminal stenosis.  Grossly stable appearance from the prior CT.   L4-L5:  Sequelae of previous partial laminectomy.  Very severe   facet degeneration.  Broad-based pseudo disc protrusion.  These are  related to the chronic spondylolisthesis.  There is mild spinal   stenosis (primarily due to lateral mass effect from the facet) with   severe bilateral lateral recess stenosis, and moderate to severe   bilateral L4 foraminal stenosis.  The appearance is grossly stable.    L5-S1:  Very severe left greater than right facet hypertrophy.   Broad-based disc osteophyte complex.   Severe bilateral lateral   recess stenosis without significant spinal stenosis.  Moderate to   severe bilateral L5 foraminal stenosis (worse on the left).   Appearance is grossly stable.    IMPRESSION:   1.  Diffuse, very severe spinal stenosis throughout the lumbar   spine, maximal at L2-L3 as detailed above.  Findings appear   chronic, and overall not significantly changed since the CT of the   abdomen and pelvis dated 06/11/2007.   2.  Postoperative changes at L4-L5 with chronic spondylolisthesis   and severe facet arthropathy at that level.  Mild spinal stenosis   with severe bilateral lateral recess stenosis.   3.  Mild and moderate spinal stenosis in the visualized lower   thoracic spine, maximal at T10-T11.  No spinal cord signal   abnormality.   4.  Multilevel moderate and severe neural foraminal stenosis in the   lower thoracic spine and lower lumbar spine as above.      Past Medical History:    Hypertension, essential    Chronic nonspecific low back pain without radiculopathy that bagan after struck by [facility] in 1995.    Spinal Stenosis, Lumbar, diffuse thruoughout lumbar spine, maximal at L2-3 by MRI 11/10 (followed by Dr Estill Bamberg at [facility])    Spinal Stenosis, Thoracic, maximal at  T10 -T11 by MRI 11/10    Spondylolisthesis, L4-5 by MRI 11/10.     Foraminal stenosis, bilaterally at L4 and at L5 by MRI 11/10    Foraminal stenosis, right, T11 by MRI 11/10    S/P L3-4, L4-5 facet joint intra-articular injection, Dr Claria Dice [facility])    Preauricular mass, right, followed by Dr Narda Bonds.    Right eye cataract     Pneumonia, April 2011  Past Surgical History:    Hysterectomy and bilateral oopherectomy at age 8 for benign reasons    Bladdr tack x 2    Urethral dilation x 1    Nasal bridge reconstruction, Dr Annamarie Dawley for congenital nasal  bridge deficiency. Surgery complicated by  nerve damage resulting in difficulty raising right eyebrow (? CN VII)    Carpel Tunnel Release 2010 [facility] [place])    Left Cataract removal with lens implant     Lumpectomy of Breast bilaterally, Dr Lurene Shadow    S/P L4-5 Laminectomy            Habits & Providers     Orthopedist: [facility], Dr(s) Wang & Dumonski      Last PAP:  normal (12/17/2006 8:30:05 AM) PAP Next Due:  Not Indicated Mammogram Result Date:  05/03/2009 Mammogram Result:  normal Mammogram Next Due:  2 yr

## 2011-01-16 NOTE — Miscellaneous (Signed)
Summary: Clinical Data Summary of Dr Jerelyn Scott Husain's records.  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 (ICD-389.18) Observations: Added new observation of AUDIOLOGIST: Adriana Mccallum, MA, CCC-A, [facility] (05/10/2010 13:12) Added new observation of ORTHOPEDMD: Cindee Salt, MD (05/10/2010 13:12) Added new observation of ENT MD: Narda Bonds, MD  (05/10/2010 13:12) Added new observation of PLASTSURGNAM: Aurelio Jew, MD (05/10/2010 13:12) Added new observation of GYNECO MD: M. Leda Quail, MD (05/10/2010 13:12) Added new observation of DEXANXTDUE: Not Indicated (05/10/2010 13:12) Added new observation of SOCIAL HX: Widowed in 2009 Lives alone. 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 (05/10/2010 13:12) Added new observation of FAMILY HX: Heart Disease in Mother or Sister Heart Disease in Father or Brother No Cancers Daugter with DMT2 Father died age 92 of Stroke Mother died age 66 with dementia Lupus in brother Diabetes in family   (05/10/2010 13:12) Added new observation of FH STROKE: Family History of Stroke M 1st degree relative <50 (05/10/2010 13:12) Added new observation of COLONNXTDUE: 12/17/2010 (05/10/2010 13:12) Added new observation of PAST SURG HX: Hysterectomy and bilateral oopherectomy at age 77 for benign reasons Bladdr tack x 2 (Dr Brunilda Payor) Rectal  prolapse and cyctocele adter hysterectomy requiring anterior repair Judie Petit. Leda Quail, MD) Urethral dilation x 1 S/P excision of Right Parotid Gland Benign Tumor, 1990 Nasal bridge reconstruction (Dr Micki Riley, 1994) for following  Forklift accident on job. Surgery complicated by nerve damage resulting in difficulty raising right eyebrow (? CN VII) Carpel Tunnel Release of  left wrist  03/2009 (Dr Merlyn Lot): Nerve Conduction studies (Dr Johna Roles) prior to surgery showed both motor and sneory delay bilaterally.  Left Cataract removal with lens implant  Lumpectomy of benign Breast lumps bilaterally, Dr Lurene Shadow S/P L4-5 Laminectomy [phone]) for decompression of spinal stenosis Colon polypectomy 2006    (05/10/2010 13:12) Added new observation of PAST MED HX: Hypertension, essential Hyperlipidemia Benign Neoplasm of Right Parotid Gland, removed in 1990,  Chronic nonspecific low back pain without radiculopathy that bagan after struck by [facility] in [facility]. Spinal Stenosis, Lumbar, diffuse thruoughout lumbar spine, maximal at L2-3 by MRI 11/10 (followed by Dr Estill Bamberg at [facility]) Spinal Stenosis, Thoracic, maximal at  T10 -T11 by MRI 11/10 Spondylolisthesis, L4-5 by MRI 11/10.  Foraminal stenosis, bilaterally at L4 and at L5 by MRI 11/10 Foraminal stenosis, right, T11 by MRI 11/10 S/P L3-4, L4-5 facet joint intra-articular injection, Dr Claria Dice [facility]) Recurrence of Right Parotid Gland tumor, right, followed by Dr Narda Bonds, monitoring, no planned intervention currently Right eye cataract  Pneumonia, April 2011 Hx of Fibromyalgia diagnosis by Dr Donette Larry Hearing loss in left ear, wears hearing aid.  Hx of Headaches, benign Hx of rectal prolapse after hysterectomy Lichen sclerosis of the vulva (followed by pt's gynecologist, Randel Pigg, MD) Sensorineural Hearing Loss, bilateral, Right greater than Left.   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 Scientist, clinical (histocompatibility and immunogenetics) is very poor. Audiologist-Stephanie Nance at [employer] in [place].  (05/10/2010 13:12) Added new observation of URINE CULTUR: No Growth (03/01/2010 13:12) Added new observation of PROTEIN UR: negative (03/01/2010 13:12) Added new observation of TSH: 2.54 microintl units/mL (03/01/2010 13:12) Added new observation of ABSOLUTE BAS: 0.1 K/uL (03/01/2010 13:12) Added new observation of BASOPHIL %: 1 % (03/01/2010 13:12) Added new observation of EOS ABSLT: 0.1 K/uL (03/01/2010 13:12) Added new observation of % EOS AUTO: 1.9 % (03/01/2010 13:12) Added new observation of ABSOLUTE MON: 0.4 K/uL (03/01/2010 13:12) Added new observation of MONOCYTE %: 6.3 % (03/01/2010 13:12) Added new observation of ABS LYMPHOCY: 2.3 K/uL (03/01/2010 13:12) Added new observation of LYMPHS %: 40.6 % (03/01/2010 13:12) Added new observation of ABS NEUTROPH: 2.8 K/uL (03/01/2010 13:12) Added new observation of PMN %: 50 % (03/01/2010 13:12) Added new observation of PLATELETK/UL: 267 K/uL (03/01/2010 13:12) Added new observation of RDW: 13.3 % (03/01/2010 13:12) Added new observation of MCHC RBC: 32.7 g/dL (16/09/9603 54:09) Added new observation of MCV: 88.1 fL (03/01/2010 13:12) Added new observation of HCT: 37.5 % (03/01/2010 13:12) Added new observation of HGB: 12.3 g/dL (81/19/1478 29:56) Added new observation of RBC M/UL: 4.25 M/uL (03/01/2010 13:12) Added new observation of WBC COUNT: 5.7 10*3/microliter (03/01/2010 13:12) Added new observation of LASTBONESCAN: 08/17/2009  (08/17/2009 13:59) Added new observation of BONE DENSITY: normal std dev (08/17/2009 13:59) Added new observation of SGPT (ALT): 18 units/L (04/26/2009 13:12) Added new observation of SGOT (AST): 21 units/L (04/26/2009 13:12) Added new observation of ALK PHOS: 66 units/L (04/26/2009 13:12) Added new observation of BILI TOTAL: 0.4 mg/dL (21/30/8657 84:69) Added new  observation of GFRAA: 99 mL/min (04/26/2009 13:12) Added new observation of GFR: 82 mL/min (04/26/2009 13:12) Added new observation of CREATININE: 0.70 mg/dL (62/95/2841 32:44) Added new observation of BUN: 10 mg/dL (12/19/7251 66:44) Added new observation of BG RANDOM: 101 mg/dL (03/47/4259 56:38) Added new observation of CO2 PLSM/SER: 28 meq/L (04/26/2009 13:12) Added new observation of CL SERUM: 104 meq/L (04/26/2009 13:12) Added new observation of K SERUM: 4.2 meq/L (04/26/2009 13:12) Added new observation of NA: 141 meq/L (04/26/2009 13:12) Added new observation of CHOL/HDL: 3.46  (04/26/2009 13:12) Added new observation of LDL DIR: 106 mg/dL (75/64/3329 51:88) Added new observation of HDL: 52 mg/dL (41/66/0630 16:01) Added new observation of TRIGLYC TOT: 112 mg/dL (09/32/3557 32:20) Added new observation of CHOLESTEROL: 180 mg/dL (25/42/7062 37:62) Added new observation of BONE DENSITY: Hip Left: T Score = -1.0 Hip (86 percentile) Radius, left: T Score = -1.0 (90 percentile) Interpretation Patient is normal based on WHO criteria 10 year probability of hip fracture is 2.2 % and 10 year probability osteoporosis related fracture is 6.4 % based on [place] adapted WHO algorithm (FRAX)  DXA Re-evaluation recommendation: Follow-up BMD (DXA scan) as clinically indicated.    std dev (04/24/2009 13:59) Added new observation of B12: 252 pg/mL (12/06/2008 13:12) Added new observation of CHOL/HDL: 3.85  (04/29/2008 13:12) Added new observation of LDL DIR: 131 mg/dL (83/15/1761 60:73) Added new observation of HDL: 53 mg/dL (71/05/2693 85:46) Added new observation of TRIGLYC TOT: 125 mg/dL (27/02/5008 38:18) Added new observation of CHOLESTEROL: 204 mg/dL (29/93/7169 67:89) Added new observation of CTABDPELVIS:    Clinical Data:  Abdominal pain and swelling, right lower quadrant,   and flank pain for one month.  Hysterectomy.   ABDOMEN CT WITH CONTRAST:   Technique:  Multidetector CT imaging of the  abdomen was performed  following the standard protocol during bolus administration of   intravenous contrast.   Contrast:  100 cc of Omnipaque 300.   Findings:  Normal liver, spleen, pancreas, kidneys, and adrenal   glands.  No abnormal mass.  Normal caliber of the abdominal aorta.   Atherosclerotic changes of the abdominal aorta and iliac arteries.   No mesenteric abnormality.  Incidentally appreciated are advanced   degenerative disk disease changes at L2-3 - marked disk space   narrowing, endplate irregularities, sclerosis, and osteophytic   formation.  Also, retrolisthesis of L2 on L3.  Also, there is   anterolisthesis of L4 on L5, likely associated with the degree of   facet arthropathy which is marked on the right.  There is marked   stenosis of the central canal and lateral recesses at L1-2, L2-3, and   L3-4.   IMPRESSION:   Unremarkable CT scan of the abdomen, however, note comments   concerning severe spondylosis and stenotic changes of the lumbar   spine.   PELVIS CT WITH CONTRAST:   Technique:  Multidetector CT imaging of the pelvis was performed   following the standard protocol during bolus administration of   intravenous contrast.   Findings:  Hysterectomy.  No pelvic mass.   IMPRESSION:   Unremarkable pelvic CT scan.  (06/11/2007 14:08)        Past Medical History:    Hypertension, essential    Hyperlipidemia    Benign Neoplasm of Right Parotid Gland, removed in 1990,     Chronic nonspecific low back pain without radiculopathy that bagan after struck by [facility] in 1995.    Spinal Stenosis, Lumbar, diffuse thruoughout lumbar spine, maximal at L2-3 by MRI 11/10 (followed by Dr Estill Bamberg at [facility])    Spinal Stenosis, Thoracic, maximal at  T10 -T11 by MRI 11/10    Spondylolisthesis, L4-5 by MRI 11/10.     Foraminal stenosis, bilaterally at L4 and at L5 by MRI 11/10    Foraminal stenosis, right, T11 by MRI 11/10     S/P L3-4, L4-5 facet joint intra-articular injection, Dr Claria Dice [facility])    Recurrence of Right Parotid Gland tumor, right, followed by Dr Narda Bonds, monitoring, no planned intervention currently    Right eye cataract     Pneumonia, April 2011    Hx of Fibromyalgia diagnosis by Dr Donette Larry    Hearing loss in left ear, wears hearing aid.     Hx of Headaches, benign    Hx of rectal prolapse after hysterectomy    Lichen sclerosis of the vulva (followed by pt's gynecologist, Randel Pigg, MD)    Sensorineural Hearing Loss, bilateral, Right greater than Left.  Left ear hearing aid b/c work discrimination in          [place]. ear is very poor. Audiologist-Stephanie Nance at [employer] in [place].   Past Surgical History:    Hysterectomy and bilateral oopherectomy at age 33 for benign reasons    Bladdr tack x 2 (Dr Brunilda Payor)    Rectal prolapse and cyctocele adter hysterectomy requiring anterior repair Judie Petit. Leda Quail, MD)    Urethral dilation x 1    S/P excision of Right Parotid Gland Benign Tumor, 1990    Nasal bridge reconstruction (Dr Micki Riley, 1994) for following  Forklift accident on job. Surgery complicated by nerve damage resulting in difficulty raising right eyebrow (? CN VII)    Carpel Tunnel Release of  left  wrist  03/2009 (Dr Merlyn Lot): Nerve Conduction studies (Dr Johna Roles) prior to surgery showed both motor and sneory delay bilaterally.     Left Cataract removal with lens implant     Lumpectomy of benign Breast lumps bilaterally, Dr Lurene Shadow    S/P L4-5 Laminectomy [phone]) for decompression of spinal stenosis    Colon polypectomy 2006             Family History:    Heart Disease in Mother or Sister    Heart Disease in Father or Brother    No Cancers    Daugter with DMT2    Father died age 72 of Stroke    Mother died age 22 with dementia    Lupus in brother    Diabetes in family      Social History:    Widowed in 2009     Lives alone.    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    Habits & Providers     Audiologist: Adriana Mccallum, MA, Agency, [facility]     ENT: Narda Bonds, MD      Gynecologist: Judie Petit. Leda Quail, MD     Orthopedist: Cindee Salt, MD     Plastic Surgeon: Aurelio Jew, MD   Last Colonoscopy:  normal (12/18/2007 8:30:05 AM) Colonoscopy Next Due:  3 yr Bone Density Result Date:  08/17/2009 Bone Density Result:  normal Bone Density Next Due: Not Indicated   Bone Density  Procedure date:  04/24/2009  Findings:      Hip Left: T Score = -1.0 Hip (86 percentile) Radius, left: T Score = -1.0 (90 percentile) Interpretation Patient is normal based on WHO criteria 10 year probability of hip fracture is 2.2 % and 10 year probability osteoporosis related fracture is 6.4 % based on [place] adapted WHO algorithm (FRAX)  DXA Re-evaluation recommendation: Follow-up BMD (DXA scan) as clinically indicated.     CT Abdomen/Pelvis  Procedure date:  06/11/2007  Findings:         Clinical Data:  Abdominal pain and swelling, right lower quadrant,   and flank pain for one month.  Hysterectomy.   ABDOMEN CT WITH CONTRAST:   Technique:  Multidetector CT imaging of the abdomen was performed   following the standard protocol during bolus administration of   intravenous contrast.   Contrast:  100 cc of Omnipaque 300.   Findings:  Normal liver, spleen, pancreas, kidneys, and adrenal   glands.  No abnormal mass.  Normal caliber of the abdominal aorta.   Atherosclerotic changes of the abdominal aorta and iliac arteries.   No mesenteric abnormality.  Incidentally appreciated are advanced   degenerative disk disease changes at L2-3  - marked disk space   narrowing, endplate irregularities, sclerosis, and osteophytic   formation.  Also, retrolisthesis of L2 on L3.  Also, there is   anterolisthesis of L4 on L5, likely associated with the degree of   facet arthropathy which is marked on the right.  There is marked   stenosis of the central canal and lateral recesses at L1-2, L2-3, and   L3-4.  IMPRESSION:   Unremarkable CT scan of the abdomen, however, note comments   concerning severe spondylosis and stenotic changes of the lumbar   spine.   PELVIS CT WITH CONTRAST:   Technique:  Multidetector CT imaging of the pelvis was performed   following the standard protocol during bolus administration of   intravenous contrast.   Findings:  Hysterectomy.  No pelvic mass.   IMPRESSION:   Unremarkable pelvic CT scan.   Bone Density  Procedure date:  04/24/2009  Findings:      Hip Left: T Score = -1.0 Hip (86 percentile) Radius, left: T Score = -1.0 (90 percentile) Interpretation Patient is normal based on WHO criteria 10 year probability of hip fracture is 2.2 % and 10 year probability osteoporosis related fracture is 6.4 % based on [place] adapted WHO algorithm (FRAX)  DXA Re-evaluation recommendation: Follow-up BMD (DXA scan) as clinically indicated.     CT Abdomen/Pelvis  Procedure date:  06/11/2007  Findings:         Clinical Data:  Abdominal pain and swelling, right lower quadrant,   and flank pain for one month.  Hysterectomy.   ABDOMEN CT WITH CONTRAST:   Technique:  Multidetector CT imaging of the abdomen was performed   following the standard protocol during bolus administration of   intravenous contrast.   Contrast:  100 cc of Omnipaque 300.   Findings:  Normal liver, spleen, pancreas, kidneys, and adrenal   glands.  No abnormal mass.  Normal caliber of the abdominal aorta.   Atherosclerotic changes of the abdominal aorta and iliac arteries.   No mesenteric abnormality.  Incidentally appreciated are  advanced   degenerative disk disease changes at L2-3 - marked disk space   narrowing, endplate irregularities, sclerosis, and osteophytic   formation.  Also, retrolisthesis of L2 on L3.  Also, there is   anterolisthesis of L4 on L5, likely associated with the degree of   facet arthropathy which is marked on the right.  There is marked   stenosis of the central canal and lateral recesses at L1-2, L2-3, and   L3-4.   IMPRESSION:   Unremarkable CT scan of the abdomen, however, note comments   concerning severe spondylosis and stenotic changes of the lumbar   spine.   PELVIS CT WITH CONTRAST:   Technique:  Multidetector CT imaging of the pelvis was performed   following the standard protocol during bolus administration of   intravenous contrast.   Findings:  Hysterectomy.  No pelvic mass.   IMPRESSION:   Unremarkable pelvic CT scan.

## 2011-01-16 NOTE — Letter (Signed)
Summary: Lab results  [facility]  [address]   [place], [place] [address]   Phone: [phone]  Fax: [phone]    11/07/2010 MRN: [id number]  [address] [place], [place]  [address]  Dear Ms. Hole,  Your blood work from November 06, 2010 showed normal kidney function, normal electrolytes, normal hemoglobin and normal thyroid functioning.   I recommend that we check your cholesterol on your next office visit.   Sincerely,   Tawanna Cooler McDiarmid MD [facility]  Appended Document: Lab results letter mailed

## 2011-01-16 NOTE — Assessment & Plan Note (Signed)
Summary: hip pain,df   Vital Signs:  Patient profile:   77 year old female Height:      59.0 inches Weight:      161 pounds BMI:     32.64 BSA:     1.68 Temp:     98.7 degrees F BP sitting:   180 / 98  Vitals Entered By: Jone Baseman CMA (June 12, 2010 2:24 PM) CC: hip and leg pain Is Patient Diabetic? No Pain Assessment Patient in pain? yes     Location: right hip Intensity: 9   CC:  hip and leg pain.  History of Present Illness: hip and leg pain: 3 days ago was sitting on fron porch.  when stood up  heard popping sound on right side and then had worsening lower back to right and down side of hip and back of leg pain. described as pressing pain in bag and throbbing pain down the leg.  is constant but flares worse occasionally.  she does feel weak in the leg but this has been present as well for some time.  she has known severe spinal stenosis and had this pain previously and has responded to corticosteroid injections in the epidural space by [facility].  she has been presented with the option for surgery in the past for this but has declinclined her orthopedics notes.  she denies however any urinary or stool incontinence or retention or any saddle anesthesia.  she has been using a heat pack which has helped some. she has been taking her pain medication but without relief.  she reports that this am she took her pain med but she cannot remember if she took her blood pressure medicine.  her daughter Idell Pickles neal is with her today.  Habits & Providers  Alcohol-Tobacco-Diet     Tobacco Status: never  Current Medications (verified): 1)  Ambien 5 Mg Tabs (Zolpidem Tartrate) .... One Tablet By Mouth 30 Minutes Before Bedtime 2)  Diltiazem Hcl Coated Beads 240 Mg Xr24h-Cap (Diltiazem Hcl Coated Beads) .Marland Kitchen.. 1 Capsule By Mouth Daily 3)  Hydrocodone-Acetaminophen 5-500 Mg Tabs (Hydrocodone-Acetaminophen) .... 2 Tablets By Mouth Twice A Day As Needed 4)  Prednisone 50 Mg Tabs  (Prednisone) .Marland Kitchen.. 1 By Mouth Once Daily For 5 Days For Back Pain  Allergies (verified): 1)  * Estrogen  Past History:  Past medical, surgical, family and social histories (including risk factors) reviewed for relevance to current acute and chronic problems.  Past Medical History: Reviewed history from 05/10/2010 and no changes required. Hypertension, essential Hyperlipidemia Benign Neoplasm of Right Parotid Gland, removed in 1990,  Chronic nonspecific low back pain without radiculopathy that bagan after struck by [facility] in [facility]. Spinal Stenosis, Lumbar, diffuse thruoughout lumbar spine, maximal at L2-3 by MRI 11/10 (followed by Dr Estill Bamberg at [facility]) Spinal Stenosis, Thoracic, maximal at  T10 -T11 by MRI 11/10 Spondylolisthesis, L4-5 by MRI 11/10.  Foraminal stenosis, bilaterally at L4 and at L5 by MRI 11/10 Foraminal stenosis, right, T11 by MRI 11/10 S/P L3-4, L4-5 facet joint intra-articular injection, Dr Claria Dice [facility]) Recurrence of Right Parotid Gland tumor, right, followed by Dr Narda Bonds, monitoring, no planned intervention currently Right eye cataract  Pneumonia, April 2011 Hx of Fibromyalgia diagnosis by Dr Donette Larry Hearing loss in left ear, wears hearing aid.  Hx of Headaches, benign Hx of rectal prolapse after hysterectomy Lichen sclerosis of the vulva (followed by pt's gynecologist, Randel Pigg, MD) Sensorineural  Hearing Loss, bilateral, Right greater than Left.  Left ear hearing aid b/c work discrimination in       R. ear is very poor. Audiologist-Danzell Birky Nance at [employer] in [place].   Past Surgical History: Reviewed history from 05/10/2010 and no changes required. Hysterectomy and bilateral oopherectomy at age 62 for benign reasons Bladdr tack x 2 (Dr Brunilda Payor) Rectal prolapse and cyctocele adter hysterectomy requiring anterior repair Judie Petit. Leda Quail,  MD) Urethral dilation x 1 S/P excision of Right Parotid Gland Benign Tumor, 1990 Nasal bridge reconstruction (Dr Micki Riley, 1994) for following  Forklift accident on job. Surgery complicated by nerve damage resulting in difficulty raising right eyebrow (? CN VII) Carpel Tunnel Release of  left wrist  03/2009 (Dr Merlyn Lot): Nerve Conduction studies (Dr Johna Roles) prior to surgery showed both motor and sneory delay bilaterally.  Left Cataract removal with lens implant  Lumpectomy of benign Breast lumps bilaterally, Dr Lurene Shadow S/P L4-5 Laminectomy [phone]) for decompression of spinal stenosis Colon polypectomy 2006  Family History: Reviewed history from 05/10/2010 and no changes required. Heart Disease in Mother or Sister Heart Disease in Father or Brother No Cancers Daugter with DMT2 Father died age 7 of Stroke Mother died age 18 with dementia Lupus in brother Diabetes in family  Social History: Reviewed history from 05/10/2010 and no changes required. Widowed in 2009 Lives alone. 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  strength 4+/5 RLE and 5/5 LLE.  sensation intact to light touch bilateral lower extremities.  DTRs symmetrically abscent at ankles and knees bilaterally. negative SLR bilaterally.  able to ambulate to room and get on examination table.  nontender over trochanteric bursa on right. no pain to  palpation over spine itself.   Impression & Recommendations:  Problem # 1:  BACK PAIN, CHRONIC (ICD-724.5) Assessment Deteriorated  I suspect she has had an acute flare with positioning change from sitting to standing of her chronic pain/stenosis that is known her weakness she describes as unchanged and she can ambulate. while she likely needs surgery at some point because of the severity of her spinal stenosis she has declined this option in the past thus leaving her with this weakness due to compression.  there are no other red flags on examinaton or history for relief will do prednisone burst, continue heat, as needed pain meds  history and exam do not suggest compression fracture (though if not responding could consider imaging) red flags reviewed for return she requests steroid injection into her back - i have advised that we do not do the type of injection that she has had before here (despite her daughter swearing and getting upset that we have done it for her back and hip here at [facility]) and have suggested she call [facility] for an appt to have her back examined as well to see if epidural steroid injection is possible by them.   Her updated medication list for  this problem includes:    Hydrocodone-acetaminophen 5-500 Mg Tabs (Hydrocodone-acetaminophen) .Marland Kitchen... 2 tablets by mouth twice a day as needed  Orders: [facility]- Est  Level 4 (99214)  Complete Medication List: 1)  Ambien 5 Mg Tabs (Zolpidem tartrate) .... One tablet by mouth 30 minutes before bedtime 2)  Diltiazem Hcl Coated Beads 240 Mg Xr24h-cap (Diltiazem hcl coated beads) .Marland Kitchen.. 1 capsule by mouth daily 3)  Hydrocodone-acetaminophen 5-500 Mg Tabs (Hydrocodone-acetaminophen) .... 2 tablets by mouth twice a day as needed 4)  Prednisone 50 Mg Tabs (Prednisone) .Marland Kitchen.. 1 by mouth once daily for 5 days for back pain  Patient Instructions: 1)  Please call [facility] about your flare of your back pain that is going down your  leg. 2)  Pick up the prednisone pills for the next 5 days. 3)  Call if you get numbness that is worse in your leg, trouble passing your water or stool or unable to hold your water or stool or numbness in your private areas.  Prescriptions: PREDNISONE 50 MG TABS (PREDNISONE) 1 by mouth once daily for 5 days for back pain  #5 x 0   Entered and Authorized by:   Ancil Boozer  MD   Signed by:   Ancil Boozer  MD on 06/12/2010   Method used:   Electronically to        [facility] [id number] (retail)       [address] [place], [place]  [address]       Ph: [phone]       Fax: [phone]   RxID:   [id number]

## 2011-01-16 NOTE — Progress Notes (Signed)
Summary: phn msg  Phone Note Call from Patient Call back at [phone] or [phone]   Caller: Daughter-Stella Summary of Call: wants to know why a cortisone shot can't be done in our office Initial call taken by: De Nurse,  June 12, 2010 3:14 PM  Follow-up for Phone Call        busy signal. will try again later.  Follow-up by: Tawanna Cooler Taisei Bonnette MD,  June 14, 2010 9:08 AM  Additional Follow-up for Phone Call Additional follow up Details #1::        Unable to get thru to patient.  I spoke with her [name], [name], who reports patient improving with Prednisone oral but will run out soon.  Pt is afraid that when the prednisone runs out then the pain will return.   I Will see patient next week.  Will send additional low dose of prednisone to last until that app't.  Additional Follow-up by: Tawanna Cooler Brenn Deziel MD,  June 14, 2010 9:18 AM    New/Updated Medications: PREDNISONE 10 MG TABS (PREDNISONE) 3 tablets by mouth daily for 3 days, then 2 tablets daily for 3 days, then one tablet daily for 3 days. Prescriptions: PREDNISONE 10 MG TABS (PREDNISONE) 3 tablets by mouth daily for 3 days, then 2 tablets daily for 3 days, then one tablet daily for 3 days.  #18 x 0   Entered by:   Tawanna Cooler Fouad Taul MD   Authorized by:   Ancil Boozer  MD   Signed by:   Tawanna Cooler Keyana Guevara MD on 06/14/2010   Method used:   Electronically to        [facility] [id number] (retail)       [address] [place], [place]  [address]       Ph: [phone]       Fax: [phone]   RxID:   [id number]

## 2011-01-16 NOTE — Miscellaneous (Signed)
Summary: ROI - 1  ROI - 1   Imported By: Clydell Hakim 04/18/2010 16:51:29  _____________________________________________________________________  External Attachment:    Type:   Image     Comment:   External Document

## 2011-01-16 NOTE — Progress Notes (Signed)
Summary: refill  Phone Note Refill Request Call back at Home Phone [phone] Message from:  Patient  Refills Requested: Medication #1:  HYDROCODONE-ACETAMINOPHEN 5-500 MG TABS 2 tablets by mouth twice a day as needed Initial call taken by: De Nurse,  September 27, 2010 10:02 AM    Prescriptions: HYDROCODONE-ACETAMINOPHEN 5-500 MG TABS (HYDROCODONE-ACETAMINOPHEN) 2 tablets by mouth twice a day as needed  #60 x 0   Entered and Authorized by:   Tawanna Cooler McDiarmid MD   Signed by:   Tawanna Cooler McDiarmid MD on 09/27/2010   Method used:   Printed then faxed to ...       [facility] [id number] (retail)       [address] [place], [place]  [address]       Ph: [phone]       Fax: [phone]   RxID:   [id number]

## 2011-01-16 NOTE — Assessment & Plan Note (Signed)
Summary: CPE/KH    Vital Signs:  Patient profile:   76 year old female Height:      59.0 inches Weight:      162.6 pounds BMI:     32.96 Temp:     97.4 degrees F oral Pulse rate:   77 / minute BP sitting:   143 / 74  (left arm) Cuff size:   regular  Vitals Entered By: Garen Grams LPN (July 10, 2010 3:31 PM) CC: Follow up insomnia Is Patient Diabetic? No Pain Assessment Patient in pain? yes     Location: back   Primary Care Provider:  Tawanna Cooler Keoki Mchargue MD  CC:  Follow up insomnia.  History of Present Illness: Patient accompanied by Nicole Perez.  Pt was source of history. Insomnia Reports "having really good sleep" when she took Zolpidem.  Her difficulty falling asleep did not occur when she took Zolpidem.  She was able to maintain sleep for a desirable duration.  She denies confusion upon awakening. No incoordination. No problems with falls or memory.   Pt would like to continue having Zolpidem available, as needed.   Health Maintenance [name] recalls having had the Pneumovax sometime in last few years at Dr Paulita Fujita office, though I was unable to find documentation in the fax'd records from his office of this vaccination event.  She declined having Pneumovax administered  today.  Patient has been told by physicians in the past that she does not require further Cervical Cancer screening at her age b/c she has never had abnormal paps in past. She looked into having Zostavax in past and found the Co-pay prohibitive.   Dysuria Pt reports initial dysuria with voiding since she started taking a generic hydrocodone/APAP last month.  No urinary frequency nor urgency.  No nocturia.  No fever.  No vaginal discharge. No new back pain.  Right hip pain Pt received what sounds like an IM corticosteroid injection into R. Hip from her orthopedist that has significantly improved the hip pain.  The oral prednisone she received from our office help some with the Hip pain but caused  worsening of her insomnia.    Chronic (Multifactoria) Musculoskeletal back pain Adequate analgesia with Hydrocodone/APAP 5/500 2 tab twice a day as needed.  She denies uncontrollable constipation.  She takes MOM periodically if she feels constipated. Denies dizziness, GI upset.  Patient finds she is able to complete ADLs and iADLs when limited by back pain after taking the Hydrocodone/APAP.  No evidence of aberrant drug taking behavior. BENIGN NEOPLASM OF RIGHT PAROTID GLAND, EXCISED 1990 Pt feels the right preauricular mass has increased in size.  It is not painful. No interference with chewing or talking.  No nasal drainage.   HYPERTENSION Disease Monitoring   Blood pressure range:not measuring at home   Chest pain:none   Dyspnea:none   Claudication:none  Medications   Compliance:Taking diltiazem daily Side effects   Lightheadedness:no   Urinary frequency:no   Edema:no   HYPERLIPIDEMIA Disease Monitoring   Chest pain:none   Dyspnea:none   Claudication:none  Medications   Compliance:Diet only.         Contraindications/Deferment of Procedures/Staging:    Test/Procedure: Zoster vaccine    Reason for deferment: declined   Habits & Providers  Alcohol-Tobacco-Diet     Alcohol drinks/day: 0     Tobacco Status: never  Current Medications (verified): 1)  Ambien 5 Mg Tabs (Zolpidem Tartrate) .... One Tablet By Mouth 30 Minutes Before Bedtime 2)  Diltiazem Hcl Coated  Beads 240 Mg Xr24h-Cap (Diltiazem Hcl Coated Beads) .Marland Kitchen.. 1 Capsule By Mouth Daily 3)  Hydrocodone-Acetaminophen 5-500 Mg Tabs (Hydrocodone-Acetaminophen) .... 2 Tablets By Mouth Twice A Day As Needed 4)  Milk of Magnesia 7.75 % Susp (Magnesium Hydroxide) .... By Mouth As Needed  Allergies (verified): 1)  * Estrogen  Past History:  Past medical, surgical, family and social histories (including risk factors) reviewed for relevance to current acute and chronic problems.  Past Medical History: Reviewed  history from 05/10/2010 and no changes required. Hypertension, essential Hyperlipidemia Benign Neoplasm of Right Parotid Gland, removed in 1990,  Chronic nonspecific low back pain without radiculopathy that bagan after struck by [facility] in 1995. Spinal Stenosis, Lumbar, diffuse thruoughout lumbar spine, maximal at L2-3 by MRI 11/10 (followed by Dr Estill Bamberg at [facility]) Spinal Stenosis, Thoracic, maximal at  T10 -T11 by MRI 11/10 Spondylolisthesis, L4-5 by MRI 11/10.  Foraminal stenosis, bilaterally at L4 and at L5 by MRI 11/10 Foraminal stenosis, right, T11 by MRI 11/10 S/P L3-4, L4-5 facet joint intra-articular injection, Dr Claria Dice [facility]) Recurrence of Right Parotid Gland tumor, right, followed by Dr Narda Bonds, monitoring, no planned intervention currently Right eye cataract  Pneumonia, April 2011 Hx of Fibromyalgia diagnosis by Dr Donette Larry Hearing loss in left ear, wears hearing aid.  Hx of Headaches, benign Hx of rectal prolapse after hysterectomy Lichen sclerosis of the vulva (followed by pt's gynecologist, Randel Pigg, MD) Sensorineural Hearing Loss, bilateral, Right greater than Left.  Left ear hearing aid b/c work discrimination in       R. ear is very poor. Audiologist-Stephanie Nance at [facility] in [place].   Past Surgical History: Reviewed history from 05/10/2010 and no changes required. Hysterectomy and bilateral oopherectomy at age 10 for benign reasons Bladdr tack x 2 (Dr Brunilda Payor) Rectal prolapse and cyctocele adter hysterectomy requiring anterior repair Judie Petit. Leda Quail, MD) Urethral dilation x 1 S/P excision of Right Parotid Gland Benign Tumor, 1990 Nasal bridge reconstruction (Dr Micki Riley, 1994) for following  Forklift accident on job. Surgery complicated by nerve damage resulting in difficulty raising right eyebrow (? CN VII) Carpel Tunnel Release of  left wrist   03/2009 (Dr Merlyn Lot): Nerve Conduction studies (Dr Johna Roles) prior to surgery showed both motor and sneory delay bilaterally.  Left Cataract removal with lens implant  Lumpectomy of benign Breast lumps bilaterally, Dr Lurene Shadow S/P L4-5 Laminectomy [phone]) for decompression of spinal stenosis Colon polypectomy 2006  Family History: Reviewed history from 05/10/2010 and no changes required. Heart Disease in Mother or Sister Heart Disease in Father or Brother No Cancers Daugter with DMT2 Father died age 40 of Stroke Mother died age 36 with dementia Lupus in brother Diabetes in family  Social History: Reviewed history from 05/10/2010 and no changes required. Widowed in 2009 Lives alone. 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  Physical Exam  General:  alert, well-developed, and well-nourished.   Head:  No significant change by my exam of Linear fluctuant mass right preauricular extending along zygomatic arch  ~ 4 cm in length and 1-1.5 cm width. Nontender, no overlying erythema Palpable mobile nontender right preauricular node  ~ 5-8 mm diameter.  Neck:  supple and  no masses.   Lungs:  normal respiratory effort, no accessory muscle use, normal breath sounds, no crackles, and no wheezes.   Heart:  normal rate, regular rhythm, no murmur, and no JVD.   Abdomen:  soft, non-tender, normal bowel sounds, no distention, no hepatomegaly, and no splenomegaly.   Msk:  slightly limited internal rotation or right hip compared to left hip.  Pulses:  R dorsalis pedis normal and L dorsalis pedis normal.   Extremities:  No peripheral edema feet without lesions of skin Neurologic:  Able to get up on exam table without difficulty Speech clear and prosodic.    Cervical Nodes:  no anterior cervical adenopathy and no posterior cervical adenopathy.   Psych:  memory intact for recent and remote, normally interactive, good eye contact, not anxious appearing, and not depressed appearing.  Well-groomed.     Impression & Recommendations:  Problem # 1:  INSOMNIA, CHRONIC (ICD-307.42) Assessment Improved  Mrs Nicole Perez found the Zolpidem 5 mg at bedtime as needed helpful for her difficulty falling asleep.  She did not have problem with early morning awakening. She denied adverse effects from medication.  Plan to continue Zolpidem 5 mg by mouth at bedtime as needed, 15 tablets a month.   Orders: [facility]- Est  Level 4 (16109)  Problem # 2:  ESSENTIAL HYPERTENSION, BENIGN (ICD-401.1)  Adequate control. Tolerating medication. No new organ damage. Plan to continue current medication.  Goal SBP at her age is <145/90.  Needs potassium and Creatinine checked on next OV for HTN monitoring.   Her updated medication list for this problem includes:    Diltiazem Hcl Coated Beads 240 Mg Xr24h-cap (Diltiazem hcl coated beads) .Marland Kitchen... 1 capsule by mouth daily  Orders: [facility]- Est  Level 4 (60454)  Problem # 3:  HYPERLIPIDEMIA (ICD-272.4) Assessment: Comment Only  Patient declined monitoring Lipids today.  Will readdress on next CPE OV.   Orders: [facility]- Est  Level 4 (09811)  Problem # 4:  DYSURIA (ICD-788.1) Assessment: New No evidence of infection on (CC) Urinalysis.   Pt attributes the dysuria to the generic form of most recent hydocodone/APAP refill.   I am not certain if this is a likely origin of her symptom.  The symptom is mild currently.  May be evidence of atrophic vaginitis in this patient with history of Lichen Sclerosis of Vulva.  Pt has seen Dr Rondel Baton (GYN) in past for L.S.   Will monitor symptoms for now.  Orders: Urinalysis-[facility] (00000) [facility]- Est  Level 4 (91478)  Complete Medication List: 1)  Ambien 5 Mg Tabs (Zolpidem tartrate) .... One tablet by mouth  30 minutes before bedtime 2)  Diltiazem Hcl Coated Beads 240 Mg Xr24h-cap (Diltiazem hcl coated beads) .Marland Kitchen.. 1 capsule by mouth daily 3)  Hydrocodone-acetaminophen 5-500 Mg Tabs (Hydrocodone-acetaminophen) .... 2 tablets by mouth twice a day as needed 4)  Milk of Magnesia 7.75 % Susp (Magnesium hydroxide) .... By mouth as needed  Other Orders: Tdap => 44yrs IM (29562) Admin 1st Vaccine (13086)  Patient Instructions: 1)  Please schedule a follow-up appointment in 4 months .  Prescriptions: AMBIEN 5 MG TABS (ZOLPIDEM TARTRATE) One tablet by mouth 30 minutes before bedtime  #15 x 3   Entered and Authorized by:   Tawanna Cooler Stefania Goulart MD   Signed by:   Tawanna Cooler Eldridge Marcott MD on 07/10/2010   Method used:   Print then Give to Patient   RxID:   [id number] HYDROCODONE-ACETAMINOPHEN 5-500 MG TABS (HYDROCODONE-ACETAMINOPHEN) 2 tablets by mouth twice a  day as needed  #60 x 3   Entered and Authorized by:   Tawanna Cooler Danayah Smyre MD   Signed by:   Tawanna Cooler Samba Cumba MD on 07/10/2010   Method used:   Print then Give to Patient   RxID:   [id number]    Immunization History:  Pneumovax Immunization History:    Pneumovax:  historical (12/18/2003)  Immunizations Administered:  Tetanus Vaccine:    Vaccine Type: Tdap    Site: left deltoid    Mfr: GlaxoSmithKline    Dose: 0.5 ml    Route: IM    Given by: Jone Baseman CMA    Exp. Date: 06/15/2012    Lot #: [id number]    VIS given: 11/04/07 version given July 10, 2010.   Laboratory Results   Urine Tests  Date/Time Received: July 10, 2010 4:32 PM  Date/Time Reported: July 10, 2010 4:48 PM   Routine Urinalysis   Color: yellow Appearance: Clear Glucose: negative   (Normal Range: Negative) Bilirubin: negative   (Normal Range: Negative) Ketone: negative   (Normal Range: Negative) Spec. Gravity: 1.010   (Normal Range: 1.003-1.035) Blood: negative   (Normal Range: Negative) pH: 6.5   (Normal Range: 5.0-8.0) Protein: negative   (Normal Range:  Negative) Urobilinogen: 0.2   (Normal Range: 0-1) Nitrite: negative   (Normal Range: Negative) Leukocyte Esterace: small   (Normal Range: Negative)  Urine Microscopic WBC/HPF: 0-2 RBC/HPF: rare Bacteria/HPF: trace Epithelial/HPF: 1-5    Comments: ...........test performed by...........Marland KitchenTerese Door, CMA      Herpes Zoster Result Date:  07/10/2010 Herpes Zoster Result:  refused Herpes Zoster Next Due:  Refused LDL Result Date:  07/10/2010 LDL Result:  refused LDL Next Due:  Refused Last Mammogram:  normal (05/03/2009 8:12:54 AM) Mammogram Result Date:  05/03/2010 Mammogram Result:  normal Mammogram Next Due:  1 yr       Oximetry  Resting Data: Pulse: 77 BPM    Prevention & Chronic Care Immunizations   Influenza vaccine: Not documented    Tetanus booster: 07/10/2010: Tdap    Pneumococcal vaccine: Historical  (12/18/2003)    H. zoster vaccine: 07/10/2010: refused   H. zoster vaccine deferral: declined  (07/10/2010)  Colorectal Screening   Hemoccult: Not documented   Hemoccult due: Not Indicated    Colonoscopy: normal  (12/18/2007)   Colonoscopy due: 12/17/2010  Other Screening   Pap smear: normal  (12/17/2006)   Pap smear due: Not Indicated    Mammogram: normal  (05/03/2010)   Mammogram due: 05/04/2011    DXA bone density scan: normal  (08/17/2009)   DXA scan due: Not Indicated    Smoking status: never  (07/10/2010)  Lipids   Total Cholesterol: 180  (04/26/2009)   LDL: refused  (07/10/2010)   LDL Direct: 106  (04/26/2009)   HDL: 52  (04/26/2009)   Triglycerides: 112  (04/26/2009)    SGOT (AST): 21  (04/26/2009)   SGPT (ALT): 18  (04/26/2009)   Alkaline phosphatase: 66  (04/26/2009)   Total bilirubin: 0.4  (04/26/2009)  Hypertension   Last Blood Pressure: 143 / 74  (07/10/2010)   Serum creatinine: 0.70  (04/26/2009)   Serum potassium 4.2  (04/26/2009)    Hypertension flowsheet reviewed?: Yes   Progress toward BP goal: At  goal  Self-Management Support :   Personal Goals (by the next clinic visit) :      Personal blood pressure goal: 145/90  (07/10/2010)   Hypertension self-management support: Not documented    Hypertension self-management support not done because: Good outcomes  (  07/10/2010)    Lipid self-management support: Not documented     Lipid self-management support not done because: Refused  (07/10/2010)

## 2011-01-16 NOTE — Assessment & Plan Note (Signed)
Summary: cpe?,df    Vital Signs:  Patient profile:   77 year old female Height:      59.0 inches Weight:      165.06 pounds BMI:     33.46 BSA:     1.70 Temp:     98.3 degrees F Pulse rate:   76 / minute BP sitting:   168 / 81  Vitals Entered By: Jone Baseman CMA (November 06, 2010 2:02 PM)  CC: CPE Is Patient Diabetic? No Pain Assessment Patient in pain? no        Primary Care Provider:  Tawanna Cooler McDiarmid MD  CC:  CPE.  History of Present Illness: ESSENTIAL HYPERTENSION, BENIGN (ICD-401.1) Disease Monitoring   Blood pressure range:not checking at home   Chest pain:none   Dyspnea:none   Claudication:none  Medications   Compliance:taking Cartia XL 240 mg daily Side effects   Lightheadedness:none   Urinary frequency:none   Edema:none  Prevention   Exercise: ramians active.  Raking leaves yesterday without difficulty.      Salt restriction: watches salt in diet    INSOMNIA, CHRONIC (ICD-307.42) Taking the Zolipidem intermittently with good response in sleep onset and maintenance of sleep. She denies confusion, incoordination, falls, and memory difficulties. BACK PAIN, CHRONIC (ICD-724.5) Good pain control with vicodin twice daily Able to do house and yard work  Consitipation is responsive to MOM as needed BENIGN NEOPLASM OF RIGHT PAROTID GLAND, EXCISED 1990 (ICD-210.2) Feels like it may be increasing in size. No pain.  No interference with eating and speaking. No change in poor hearing in left ear.      Habits & Providers  Alcohol-Tobacco-Diet     Alcohol drinks/day: 0     Tobacco Status: never  Current Medications (verified): 1)  Ambien 5 Mg Tabs (Zolpidem Tartrate) .... One Tablet By Mouth 30 Minutes Before Bedtime 2)  Cartia Xt 300 Mg Xr24h-Cap (Diltiazem Hcl Coated Beads) .... Take One Capsule By Mouth Every Day. For High Blood Pressure. 3)  Hydrocodone-Acetaminophen 5-500 Mg Tabs (Hydrocodone-Acetaminophen) .... 2 Tablets By Mouth Twice A Day  As Needed 4)  Milk of Magnesia 7.75 % Susp (Magnesium Hydroxide) .... By Mouth As Needed  Allergies (verified): 1)  * Estrogen  Past History:  Past medical history reviewed for relevance to current acute and chronic problems.  Past Medical History: Reviewed history from 05/10/2010 and no changes required. Hypertension, essential Hyperlipidemia Benign Neoplasm of Right Parotid Gland, removed in 1990,  Chronic nonspecific low back pain without radiculopathy that bagan after struck by [facility] in 1995. Spinal Stenosis, Lumbar, diffuse thruoughout lumbar spine, maximal at L2-3 by MRI 11/10 (followed by Dr Estill Bamberg at [facility]) Spinal Stenosis, Thoracic, maximal at  T10 -T11 by MRI 11/10 Spondylolisthesis, L4-5 by MRI 11/10.  Foraminal stenosis, bilaterally at L4 and at L5 by MRI 11/10 Foraminal stenosis, right, T11 by MRI 11/10 S/P L3-4, L4-5 facet joint intra-articular injection, Dr Claria Dice [facility]) Recurrence of Right Parotid Gland tumor, right, followed by Dr Narda Bonds, monitoring, no planned intervention currently Right eye cataract  Pneumonia, April 2011 Hx of Fibromyalgia diagnosis by Dr Donette Larry Hearing loss in left ear, wears hearing aid.  Hx of Headaches, benign Hx of rectal prolapse after hysterectomy Lichen sclerosis of the vulva (followed by pt's gynecologist, Randel Pigg, MD) Sensorineural Hearing Loss, bilateral, Right greater than Left.  Left ear hearing aid b/c work discrimination in       [place]. Scientist, clinical (histocompatibility and immunogenetics)  is very poor. Audiologist-Stephanie Nance at [employer] in [place].   Physical Exam  General:  Elderly female, alert, unable to stand straight, moved fairly well onto exam table, leans to left with walking in hall Head:  No significant change by my exam of Linear fluctuant mass right preauricular extending along zygomatic arch  ~ 4 cm in length and 1-1.5 cm width. Nontender,  no overlying erythema Palpable mobile nontender right preauricular node  ~ 5-8 mm diameter.  Lungs:  normal respiratory effort, no accessory muscle use, normal breath sounds, no crackles, and no wheezes.   Heart:  normal rate, regular rhythm, no murmur, and no JVD.   Abdomen:  Bowel sounds positive,abdomen soft and non-tender without masses, organomegaly or hernias noted. Extremities:  No peripheral edema feet without lesions of skin   Impression & Recommendations:  Problem # 1:  ESSENTIAL HYPERTENSION, BENIGN (ICD-401.1) Assessment Deteriorated  Patient not under ideal systolic BP control.  No evidence of new end organ damage.  Tolerating Diltiazem without difficulty. No comorbid compelling indications for particular antihypertensive medication types.  Plan: Will increase the Cartia XT to 300 mg daily from 240 mg daily.  Recheck BP in 2 to 3 weeks.  May add thiazide if further reduction needed.  Her updated medication list for this problem includes:    Cartia Xt 300 Mg Xr24h-cap (Diltiazem hcl coated beads) .Marland Kitchen... Take one capsule by mouth every day. for high blood pressure.  Orders: [facility]- Est  Level 4 (99214)  Problem # 2:  INSOMNIA, CHRONIC (ICD-307.42)  Good control of DFA and EMA.  Tolerating Zolpidem as needed without significant adverse effect. Plan to continue current medication.  Orders: [facility]- Est  Level 4 (52841)  Problem # 3:  BACK PAIN, CHRONIC (ICD-724.5) Assessment: Improved  Adequate analgesia and maintainance of activity on Vicodan two times a day as needed.  Constipation self-managed using MOM as needed.  No abberant drug seeking behaviors displayed since last assessment.  Plan to continue Vicodan as needed .  Her updated medication list for this problem includes:    Hydrocodone-acetaminophen 5-500 Mg Tabs (Hydrocodone-acetaminophen) .Marland Kitchen... 2 tablets by mouth twice a day as needed  Orders: [facility]- Est  Level 4 (99214)  Problem # 4:  BENIGN NEOPLASM OF RIGHT PAROTID  GLAND, EXCISED 1990 (ICD-210.2)  Recommended Ms Boster reconsult Dr Ezzard Standing (ENT) to follow up her parotic gland mass.  Pt feels it is enlarging though it appears stable in size on my exam.   Orders: [facility]- Est  Level 4 (32440)  Problem # 5:  LICHEN SCLEROSIS OF VULVA (ICD-624.8) Assessment: Comment Only On next visit will recommend that Ms Engdahl have a re-examination by her GYN, Dr Deliah Boston given rare transformation of vulvar lichen sclerosis to squamous cell cancer.  Other option would be [facility].   Problem # 6:  HYPERLIPIDEMIA (ICD-272.4) Check Lipids on next OV.  Complete Medication List: 1)  Ambien 5 Mg Tabs (Zolpidem tartrate) .... One tablet by mouth 30 minutes before bedtime 2)  Cartia Xt 300 Mg Xr24h-cap (Diltiazem hcl coated beads) .... Take one capsule by mouth every day. for high blood pressure. 3)  Hydrocodone-acetaminophen 5-500 Mg Tabs (Hydrocodone-acetaminophen) .... 2 tablets by mouth twice a day as needed 4)  Milk of Magnesia 7.75 % Susp (Magnesium hydroxide) .... By mouth as needed  Other Orders: Basic Met-[facility] [phone]) CBC-[facility] (40347) TSH-[facility] [phone])  Contraindications/Deferment of Procedures/Staging:    Treatment: Flu Shot    Contraindication: N/A  Test/Procedure: FLU VAX    Reason for deferment: patient declined   Patient Instructions: 1)  Please schedule a follow-up appointment in 2 to 3 weeks.  2)  Ms Polhemus, once you finish yourcurrent supply of Cartia XL 240 mg capsules, please start the higher dose of Cartia XL 300mg .  Take one capsule daily.  This is for your blood pressure.  Prescriptions: CARTIA XT 300 MG XR24H-CAP (DILTIAZEM HCL COATED BEADS) Take one capsule by mouth every day. For High Blood Pressure.  #30 x 5   Entered and Authorized by:   Tawanna Cooler McDiarmid MD   Signed by:   Tawanna Cooler McDiarmid MD on 11/06/2010   Method used:   Electronically to        [facility] [id number] (retail)       [address] [place], [place]  [address]       Ph: [phone]       Fax: [phone]   RxID:   [id number]    Orders Added: 1)  Basic Met-[facility] [phone] 2)  CBC-[facility] [85027] 3)  TSH-[facility] [[id number]] 4)  [facility]- Est  Level 4 [70623]      Prevention & Chronic Care Immunizations   Influenza vaccine: Not documented   Influenza vaccine deferral: patient declined  (11/06/2010)    Tetanus booster: 07/10/2010: Tdap    Pneumococcal vaccine: Historical  (12/18/2003)    H. zoster vaccine: 07/10/2010: refused   H. zoster vaccine deferral: declined  (07/10/2010)  Colorectal Screening   Hemoccult: Not documented   Hemoccult due: Not Indicated    Colonoscopy: normal  (12/18/2007)   Colonoscopy due: 12/17/2010  Other Screening   Pap smear: normal  (12/17/2006)   Pap smear due: Not Indicated    Mammogram: Normal  (07/11/2010)   Mammogram due: 07/2011    DXA bone density scan: normal  (08/17/2009)   DXA scan due: Not Indicated    Smoking status: never  (11/06/2010)  Lipids   Total Cholesterol: 180  (04/26/2009)   LDL: refused  (07/10/2010)   LDL Direct: 106  (04/26/2009)   HDL: 52  (04/26/2009)   Triglycerides: 112  (04/26/2009)    SGOT (AST): 21  (04/26/2009)   SGPT (ALT): 18  (04/26/2009)   Alkaline phosphatase: 66  (04/26/2009)   Total bilirubin: 0.4  (04/26/2009)    Lipid flowsheet reviewed?: Yes   Progress toward LDL goal: At goal  Hypertension   Last Blood Pressure: 168 / 81  (11/06/2010)   Serum creatinine: 0.70  (04/26/2009)   Serum potassium 4.2  (04/26/2009)    Hypertension flowsheet reviewed?: Yes   Progress toward BP goal: Deteriorated  Self-Management Support :   Personal Goals (by the next clinic visit) :      Personal blood pressure goal: 145/90  (07/10/2010)   Patient will work on the following items until the next clinic visit to reach self-care goals:     Medications and monitoring: take my medicines every day  (11/06/2010)     Hypertension self-management support: Written self-care plan, Education handout, Pre-printed educational material  (11/06/2010)   Hypertension self-care plan printed.   Hypertension education handout printed    Hypertension self-management support not done because: Good outcomes  (07/10/2010)    Lipid self-management support: Not documented     Lipid self-management support not done because: Good outcomes  (11/06/2010)

## 2011-01-16 NOTE — Consult Note (Signed)
Summary: SM&OC  SM&OC   Imported By: Clydell Hakim 08/10/2010 11:08:35  _____________________________________________________________________  External Attachment:    Type:   Image     Comment:   External Document

## 2011-01-16 NOTE — Progress Notes (Signed)
  Phone Note Refill Request   Refills Requested: Medication #1:  HYDROCODONE-ACETAMINOPHEN 5-500 MG TABS 2 tablets by mouth twice a day as needed daughter called [facility] to have them fax request for refill and need to have Nicole Perez fax order back.  Initial call taken by: Abundio Miu,  October 24, 2010 2:23 PM    Prescriptions: HYDROCODONE-ACETAMINOPHEN 5-500 MG TABS (HYDROCODONE-ACETAMINOPHEN) 2 tablets by mouth twice a day as needed  #60 x 2   Entered and Authorized by:   Tawanna Cooler McDiarmid MD   Signed by:   Tawanna Cooler McDiarmid MD on 10/26/2010   Method used:   Telephoned to ...       [facility] [id number] (retail)       [address] [place], [place]  [address]       Ph: [phone]       Fax: [phone]   RxID:   [id number]   Appended Document:  rx called to pharmacy.

## 2011-01-16 NOTE — Consult Note (Signed)
Summary: [facility]  [facility]   Imported By: Clydell Hakim 04/26/2010 11:44:38  _____________________________________________________________________  External Attachment:    Type:   Image     Comment:   External Document

## 2011-01-16 NOTE — Assessment & Plan Note (Signed)
Summary: back pain/[place]   Vital Signs:  Patient profile:   77 year old female Height:      59.0 inches Weight:      162 pounds BMI:     32.84 Temp:     98.2 degrees F oral Pulse rate:   76 / minute BP sitting:   131 / 89  (left arm) Cuff size:   regular  Vitals Entered By: Garen Grams LPN (July 31, 2010 1:33 PM) CC: lower back pain x 1.5 wks Is Patient Diabetic? No Pain Assessment Patient in pain? yes     Location: lower back Intensity: 8   Primary Care Berkley Cronkright:  Tawanna Cooler McDiarmid MD  CC:  lower back pain x 1.5 wks.  History of Present Illness: (07/24/10 [facility] Office Visit) 77 year old with known lumbar spinal stenosis and chronic back pain.  Two days ago was down on hands and knees cleaning her baseboards and moving furniture.  She was fine until the next day and she is unable to stand straight, she has left sided low back pain that is worse with movement.    Trial of NSAID and increase in her chronic vicodin analgesic has not improved right sided back pain. Pain is constatnt, non-progressive No radiation. Aching quality Interfering with iADLs Worse with lateral bending at waist away from aching side. No buttock or thigh pain. No change with sitting down or standing up. No improvement with lying down.  No fever or chills No acute trauma other than the rapid brief increase in her work as described above.     Habits & Providers  Alcohol-Tobacco-Diet     Tobacco Status: never  Current Medications (verified): 1)  Ambien 5 Mg Tabs (Zolpidem Tartrate) .... One Tablet By Mouth 30 Minutes Before Bedtime 2)  Diltiazem Hcl Coated Beads 240 Mg Xr24h-Cap (Diltiazem Hcl Coated Beads) .Marland Kitchen.. 1 Capsule By Mouth Daily 3)  Hydrocodone-Acetaminophen 5-500 Mg Tabs (Hydrocodone-Acetaminophen) .... 2 Tablets By Mouth Twice A Day As Needed 4)  Milk of Magnesia 7.75 % Susp (Magnesium Hydroxide) .... By Mouth As Needed  Allergies (verified): 1)  * Estrogen  Physical Exam  General:   Elderly female, alert, unable to stand straight, moved fairly well onto exam table, leans to left with walking in hall Abdomen:  soft, non-tender, normal bowel sounds, and no masses.   Msk:  Tender at right lateral edge of right  Quadratus lumborum. No overlying visible abnormality.   No CVAT.    Impression & Recommendations:  Problem # 1:  BACK PAIN, ACUTE (ICD-724.5) After sterile prep, 3 ml of 1% lidocaine infiltrated into most tender region of back (region or right quadratus lumborum mm) with significant relief of pain.  This finding is consistent with a myofascial pain. Ms Mcinerney declined Physical Therapy referral because the co-pay is beyond her financial means.  She was given exercises for low back pain to perform at home.  She will let me know if she changes her mind about referral to physical therapy. RTC if condition worsens or does not improve at least by 50% in next 2-3 weeks.  The following medications were removed from the medication list:    Diclofenac Sodium 75 Mg Tbec (Diclofenac sodium) ..... One two times a day for 7 days Her updated medication list for this problem includes:    Hydrocodone-acetaminophen 5-500 Mg Tabs (Hydrocodone-acetaminophen) .Marland Kitchen... 2 tablets by mouth twice a day as needed  Orders: Urinalysis-[facility] (00000) [facility]- Est Level  3 (87564)  Problem # 2:  PYURIA (ICD-791.9) Assessment: New  Clean catch collection. Recent complaint of dysuria.  Given the right 'flank' pain, will give 7 days of cephalexin 500mg  three times a day by mouth.   Orders: [facility]- Est Level  3 ([address])  Complete Medication List: 1)  Ambien 5 Mg Tabs (Zolpidem tartrate) .... One tablet by mouth 30 minutes before bedtime 2)  Diltiazem Hcl Coated Beads 240 Mg Xr24h-cap (Diltiazem hcl coated beads) .Marland Kitchen.. 1 capsule by mouth daily 3)  Hydrocodone-acetaminophen 5-500 Mg Tabs (Hydrocodone-acetaminophen) .... 2 tablets by mouth twice a day as needed 4)  Milk of Magnesia 7.75 % Susp (Magnesium  hydroxide) .... By mouth as needed 5)  Cephalexin 500 Mg Caps (Cephalexin) .... Take one talbet by mouth daily for 7 days.  Patient Instructions: 1)  Return to see doctor if condition does not improve at least 50% or more over next 2 to 3 weeks or if your condition worsens.  Prescriptions: CEPHALEXIN 500 MG CAPS (CEPHALEXIN) Take one talbet by mouth daily for 7 days.  #21 x 0   Entered and Authorized by:   Tawanna Cooler McDiarmid MD   Signed by:   Tawanna Cooler McDiarmid MD on 08/01/2010   Method used:   Electronically to        [facility] [id number] (retail)       [address] [place], [place]  [address]       Ph: [phone]       Fax: [phone]   RxID:   [id number]   Laboratory Results   Urine Tests  Date/Time Received: July 31, 2010 2:22 PM  Date/Time Reported: July 31, 2010 2:46 PM   Routine Urinalysis   Color: yellow Appearance: Clear Glucose: negative   (Normal Range: Negative) Bilirubin: negative   (Normal Range: Negative) Ketone: negative   (Normal Range: Negative) Spec. Gravity: 1.020   (Normal Range: 1.003-1.035) Blood: small   (Normal Range: Negative) pH: 5.5   (Normal Range: 5.0-8.0) Protein: negative   (Normal Range: Negative) Urobilinogen: 0.2   (Normal Range: 0-1) Nitrite: negative   (Normal Range: Negative) Leukocyte Esterace: large   (Normal Range: Negative)  Urine Microscopic WBC/HPF: >20 RBC/HPF: 1-5 Bacteria/HPF: 1+ Epithelial/HPF: 1-5    Comments: ...............test performed by......Marland KitchenBonnie A. [place], MLS (ASCP)cm

## 2011-01-16 NOTE — Miscellaneous (Signed)
Summary: ROI  ROI   Imported By: Clydell Hakim 04/18/2010 16:09:11  _____________________________________________________________________  External Attachment:    Type:   Image     Comment:   External Document

## 2011-01-16 NOTE — Progress Notes (Signed)
Summary: results   Phone Note Call from Patient Call back at Home Phone [phone]   Caller: Patient Summary of Call: pt had xrays yesterday - and is still in a lot of pain - wants to know what the results are Initial call taken by: De Nurse,  August 09, 2010 11:18 AM  Follow-up for Phone Call        told her we do not have xray results. she had gone to another md & they did xrays. they told her she has a pulled muscles as xray did not show anything wrong.  has another appt with Dr. Elesa Massed? tomorrow about this. she is taking hydrocodone that was rx'd by pcp here. states she is still in pain from this. told her I will forward to her pcp Follow-up by: Golden Circle RN,  August 09, 2010 11:22 AM  Additional Follow-up for Phone Call Additional follow up Details #1::        I spoke with patient.  She will see orthopedist tomorrow and is willing to pay the co-pay $40 for Physical therapy if it is recommended.  Pt has been taking one vicodin daily instead of recommended two at a time.  She is afraid it would hurt her kidneys.  She will call me after she sees her orthopedist tomorrow.  Additional Follow-up by: Tawanna Cooler McDiarmid MD,  August 09, 2010 11:53 AM

## 2011-01-16 NOTE — Progress Notes (Signed)
Summary: triage  Phone Note Call from Patient Call back at Home Phone [phone]   Summary of Call: Pt has pulled muscle and still not able to stand up striaght. Initial call taken by: Clydell Hakim,  July 31, 2010 10:56 AM  Follow-up for Phone Call        states she is laying in bed with back pain. out of muscle relaxants. appt made at 1:30 with pcp today Follow-up by: Golden Circle RN,  July 31, 2010 11:06 AM

## 2011-01-16 NOTE — Assessment & Plan Note (Signed)
Summary: hip pain,df   Vital Signs:  Patient profile:   77 year old female Weight:      161 pounds Temp:     98.2 degrees F oral Pulse rate:   77 / minute BP sitting:   150 / 90  (left arm) Cuff size:   regular  Vitals Entered By: Arlyss Repress CMA, (July 24, 2010 11:34 AM) CC: lower right sided back pain started saturday after cleaning. denies uti sx's, Back Pain Is Patient Diabetic? No Pain Assessment Patient in pain? yes     Location: lower back Intensity: 10 Onset of pain  x 2 days   Primary Care Yaniv Lage:  Tawanna Cooler McDiarmid MD  CC:  lower right sided back pain started saturday after cleaning. denies uti sx's and Back Pain.  History of Present Illness: 77 year old with known lumbar spinal stenosis and chronic back pain.  Two days ago was down on hands and knees cleaning her baseboards and moving furniture.  She was fine until the next day and she is unable to stand straight, she has left sided low back pain that is worse with movement.  She generally uses 1.5 of hydrocodone/APAP two times a day for pain.  She would like a shot to make her better.      Back Pain History:      The patient's back pain started approximately 07/22/2010.  The pain is located in the lower back region and does not radiate below the knees.  She states this is not work related.  On a scale of 1-10, she describes the pain as a 9.  She states that she has had a prior history of back pain.  The patient has not had any recent physical therapy for her back pain.  The following makes the back pain better: sitting.  The following makes the back pain worse: movement, trying to stand erect.        Description of injury in patient's own words:  Cleaning floors on hands and knees, pain onset for 24 hours later.     Habits & Providers  Alcohol-Tobacco-Diet     Tobacco Status: never  Current Medications (verified): 1)  Ambien 5 Mg Tabs (Zolpidem Tartrate) .... One Tablet By Mouth 30 Minutes Before Bedtime 2)   Diltiazem Hcl Coated Beads 240 Mg Xr24h-Cap (Diltiazem Hcl Coated Beads) .Marland Kitchen.. 1 Capsule By Mouth Daily 3)  Hydrocodone-Acetaminophen 5-500 Mg Tabs (Hydrocodone-Acetaminophen) .... 2 Tablets By Mouth Twice A Day As Needed 4)  Milk of Magnesia 7.75 % Susp (Magnesium Hydroxide) .... By Mouth As Needed 5)  Diclofenac Sodium 75 Mg Tbec (Diclofenac Sodium) .... One Two Times A Day For 7 Days  Allergies: 1)  * Estrogen  Review of Systems      See HPI General:  Denies chills and fever. GI:  Denies nausea and vomiting. GU:  Denies dysuria. MS:  Complains of low back pain; denies loss of strength.  Physical Exam  General:  Elderly female, alert, unable to stand straight, moved fairly well onto exam table, but severe muscle spasm when attempting to lay down Msk:  Negative straight leg raises in the sitting position.  Severe back spasms when going from sitting to lying position.  Unable to stand erect. Neurologic:  abnormal gait-leaning forward with limp away from the right  Low Back Pain Physical Exam:    Inspection-deformity:     Yes    Palpation-spinal tenderness:   Yes    Motor Exam/Strength:  Left Ankle Dorsiflexion (L5,L4):     normal       Left Great Toe Dorsiflexion (L5,L4):     normal       Left Heel Walk (L5,some L4):     abnormal       Left Single Squat & Rise-Quads (L4):   abnormal       Left Toe Walk-calf (S1):       abnormal       Right Ankle Dorsiflexion (L5,L4):     normal       Right Great Toe Dorsiflexion (L5,L4):       normal       Right Heel Walk (L5,some L4):     abnormal       Right Single Squat & Rise Quads (L4):   abnormal       Right Toe Walk-calf (S1):       abnormal    Straight Leg Raise (SLR):       Left Straight Leg Raise (SLR):   negative       Right Straight Leg Raise (SLR):   negative   Impression & Recommendations:  Problem # 1:  BACK PAIN, CHRONIC (ICD-724.5)  Acute flair:  will increase the medication that she is currently taking to qid as  needed for several days, add NSAID for only one week, ice.  Expect slow recovery.  Toradol IM injection in office at 30 mg. (creatitine 0.7)  Follow up with primary MD in 2 weeks or sooner if needed. Her updated medication list for this problem includes:    Hydrocodone-acetaminophen 5-500 Mg Tabs (Hydrocodone-acetaminophen) .Marland Kitchen... 2 tablets by mouth twice a day as needed    Diclofenac Sodium 75 Mg Tbec (Diclofenac sodium) ..... One two times a day for 7 days  Orders: [facility]- Est Level  3 (04540)  Complete Medication List: 1)  Ambien 5 Mg Tabs (Zolpidem tartrate) .... One tablet by mouth 30 minutes before bedtime 2)  Diltiazem Hcl Coated Beads 240 Mg Xr24h-cap (Diltiazem hcl coated beads) .Marland Kitchen.. 1 capsule by mouth daily 3)  Hydrocodone-acetaminophen 5-500 Mg Tabs (Hydrocodone-acetaminophen) .... 2 tablets by mouth twice a day as needed 4)  Milk of Magnesia 7.75 % Susp (Magnesium hydroxide) .... By mouth as needed 5)  Diclofenac Sodium 75 Mg Tbec (Diclofenac sodium) .... One two times a day for 7 days  Patient Instructions: 1)  Increase pain pills to 2 four times a day prn for one week, then back to usual dosage, as needed 2)  Ice that area 3)  Volatren two times a day for one week. 4)  Dr. McDiarmid in 2 weeks Prescriptions: DICLOFENAC SODIUM 75 MG TBEC (DICLOFENAC SODIUM) one two times a day for 7 days  #14 x 0   Entered and Authorized by:   Luretha Murphy NP   Signed by:   Luretha Murphy NP on 07/24/2010   Method used:   Electronically to        [facility] [id number] (retail)       [address] [place], [place]  [address]       Ph: [phone]       Fax: [phone]   RxID:   [id number]   Appended Document: hip pain,df     Allergies: 1)  * Estrogen   Complete Medication List: 1)  Ambien 5 Mg Tabs (Zolpidem tartrate) .... One tablet by mouth 30 minutes before  bedtime 2)  Diltiazem Hcl Coated Beads 240 Mg Xr24h-cap (Diltiazem hcl coated beads)  .Marland Kitchen.. 1 capsule by mouth daily 3)  Hydrocodone-acetaminophen 5-500 Mg Tabs (Hydrocodone-acetaminophen) .... 2 tablets by mouth twice a day as needed 4)  Milk of Magnesia 7.75 % Susp (Magnesium hydroxide) .... By mouth as needed 5)  Diclofenac Sodium 75 Mg Tbec (Diclofenac sodium) .... One two times a day for 7 days  Other Orders: Ketorolac-Toradol 15mg  (X9147)    Medication Administration  Injection # 1:    Medication: Ketorolac-Toradol 15mg     Diagnosis: BACK PAIN, CHRONIC (ICD-724.5)    Route: IM    Site: L deltoid    Exp Date: 02/15/2012    Lot #: [id number]    Mfr: hospira    Comments: 30mg /61ml given    Patient tolerated injection without complications    Given by: Tessie Fass CMA (July 24, 2010 2:16 PM)  Orders Added: 1)  Ketorolac-Toradol 15mg  [[id number]]

## 2011-01-18 NOTE — Assessment & Plan Note (Signed)
Summary: VULVAR LICHEN SCLEROSIS/KH   Vital Signs:  Patient profile:   77 year old female Height:      59.0 inches Weight:      167 pounds Temp:     97.7 degrees F oral Pulse rate:   80 / minute Pulse rhythm:   regular BP sitting:   166 / 95  (right arm) Cuff size:   regular  Vitals Entered By: Loralee Pacas CMA (January 11, 2011 11:14 AM) CC: VULVAR LICHEN SCLEROSIS   Primary Care Provider:  Tawanna Cooler McDiarmid MD  CC:  VULVAR LICHEN SCLEROSIS.  History of Present Illness: hx of diagnosis lichen sclerosis by Dr Rondel Baton (GYN). ms Schraeder reports never having symptoms related to this, evidently found while  getting eval and tx for bladder tack (has had 3) is still asx--no burning or pressure externally. no bleeding from GU area. Normal urination  Allergies: 1)  * Estrogen  Physical Exam  General:  alert, well-developed, well-nourished, well-hydrated, and overweight-appearing.   Genitalia:  Introitus reveals normal anatomy, mucosa of inner labia majora have the typical appeaaracnce o lichen sclerosis with thin papery shiny skin.   Additional Exam:  Patient given informed consent, signed copy in the chart. Placed in lithotomy position. external exam with colposcope. Any acetowhite lesions noted? none Any abnormalities seen with green filter? none Any abnormalities seen with application of Lugol's solution? not used Was the endocervical canal sampled? not done Were any cervical biopsies taken? not done Were there any complications?no COMMENTS: Patient was given post procedure instructions. We will notify her of any results.    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 if she becomes symptomatic, she will call.  Orders: Colposcopy w/out biopsy [facility] ([id number])  Complete Medication List: 1)  Ambien 5 Mg Tabs (Zolpidem tartrate) .... One tablet by mouth 30 minutes before bedtime 2)  Cartia Xt 300 Mg  Xr24h-cap (Diltiazem hcl coated beads) .... Take one capsule by mouth every day. for high blood pressure. 3)  Hydrocodone-acetaminophen 5-500 Mg Tabs (Hydrocodone-acetaminophen) .... 2 tablets by mouth twice a day as needed 4)  Milk of Magnesia 7.75 % Susp (Magnesium hydroxide) .... By mouth as needed 5)  Carmol 10 10 % Lotn (Urea) .... Apply twice a day to skin on soles of feet to remove dead, flaking skin. disp: one month supply 6)  Cephalexin 500 Mg Caps (Cephalexin) .... Take one tablet by mouth three times a day for 7 days for bladder infection   Orders Added: 1)  Colposcopy w/out biopsy -[facility] [[id number]]

## 2011-01-18 NOTE — Progress Notes (Signed)
  Phone Note Outgoing Call   Call placed by: Tawanna Cooler Brannon Decaire MD,  January 03, 2011 11:36 AM Call placed to: Patient Summary of Call: Feeling better.    Burning with voiding improved Nocturia less but still present.  She cannot afford Carmol cream for feet.  I recommended "Heel Cream" OTC instead.  Initial call taken by: Tawanna Cooler Cliford Sequeira MD,  January 03, 2011 11:40 AM

## 2011-01-18 NOTE — Assessment & Plan Note (Signed)
Summary: per Kyung Muto/eo   Vital Signs:  Patient profile:   77 year old female Height:      59.0 inches Weight:      170.7 pounds BMI:     34.60 Temp:     97.6 degrees F oral Pulse rate:   78 / minute BP sitting:   148 / 71  (left arm) Cuff size:   regular  Vitals Entered By: Garen Grams LPN (January 01, 2011 1:47 PM) CC: f/u bp Is Patient Diabetic? No Pain Assessment Patient in pain? no        Primary Care Provider:  Tawanna Cooler Mendi Constable MD  CC:  f/u bp.  History of Present Illness: Edema Mild edema at ankles Increased with Increase in Cartia medication Goes down at night No shortness or breath, no DOE, no chest pain, no claudiation  Suprapubic pain Onset about 1 week ago, associated intial dysuria and nocturia No fever/chill. No new back pain. No change in bowel habits.  Last BM a day ago was normal form No black tarry stool or bright red stool.   Peeling skin on soles of feet. No itching No odor.    Habits & Providers  Alcohol-Tobacco-Diet     Alcohol drinks/day: 0     Tobacco Status: never  Current Medications (verified): 1)  Ambien 5 Mg Tabs (Zolpidem Tartrate) .... One Tablet By Mouth 30 Minutes Before Bedtime 2)  Cartia Xt 300 Mg Xr24h-Cap (Diltiazem Hcl Coated Beads) .... Take One Capsule By Mouth Every Day. For High Blood Pressure. 3)  Hydrocodone-Acetaminophen 5-500 Mg Tabs (Hydrocodone-Acetaminophen) .... 2 Tablets By Mouth Twice A Day As Needed 4)  Milk of Magnesia 7.75 % Susp (Magnesium Hydroxide) .... By Mouth As Needed 5)  Carmol 10 10 % Lotn (Urea) .... Apply Twice A Day To Skin On Soles of Feet To Remove Dead, Flaking Skin. Disp: One Month Supply 6)  Cephalexin 500 Mg Caps (Cephalexin) .... Take One Tablet By Mouth Three Times A Day For 7 Days For Bladder Infection  Allergies (verified): 1)  * Estrogen  Past History:  Past Medical History: Last updated: 05/10/2010 Hypertension, essential Hyperlipidemia Benign Neoplasm of Right Parotid  Gland, removed in 1990,  Chronic nonspecific low back pain without radiculopathy that bagan after struck by [facility] in 1995. Spinal Stenosis, Lumbar, diffuse thruoughout lumbar spine, maximal at L2-3 by MRI 11/10 (followed by Dr Estill Bamberg at [facility]) Spinal Stenosis, Thoracic, maximal at  T10 -T11 by MRI 11/10 Spondylolisthesis, L4-5 by MRI 11/10.  Foraminal stenosis, bilaterally at L4 and at L5 by MRI 11/10 Foraminal stenosis, right, T11 by MRI 11/10 S/P L3-4, L4-5 facet joint intra-articular injection, Dr Claria Dice [facility]) Recurrence of Right Parotid Gland tumor, right, followed by Dr Narda Bonds, monitoring, no planned intervention currently Right eye cataract  Pneumonia, April 2011 Hx of Fibromyalgia diagnosis by Dr Donette Larry Hearing loss in left ear, wears hearing aid.  Hx of Headaches, benign Hx of rectal prolapse after hysterectomy Lichen sclerosis of the vulva (followed by pt's gynecologist, Randel Pigg, MD) Sensorineural Hearing Loss, bilateral, Right greater than Left.  Left ear hearing aid b/c work discrimination in       R. ear is very poor. Audiologist-Stephanie Nance at [employer] in [place].   Past Surgical History: Last updated: 05/10/2010 Hysterectomy and bilateral oopherectomy at age 48 for benign reasons Bladdr tack x 2 (Dr Brunilda Payor) Rectal prolapse and cyctocele adter hysterectomy requiring anterior repair (M.  Leda Quail, MD) Urethral dilation x 1 S/P excision of Right Parotid Gland Benign Tumor, 1990 Nasal bridge reconstruction (Dr Micki Riley, 1994) for following  Forklift accident on job. Surgery complicated by nerve damage resulting in difficulty raising right eyebrow (? CN VII) Carpel Tunnel Release of  left wrist  03/2009 (Dr Merlyn Lot): Nerve Conduction studies (Dr Johna Roles) prior to surgery showed both motor and sneory delay bilaterally.  Left Cataract removal with lens  implant  Lumpectomy of benign Breast lumps bilaterally, Dr Lurene Shadow S/P L4-5 Laminectomy [phone]) for decompression of spinal stenosis Colon polypectomy 2006  Social History: Last updated: 05/10/2010 Widowed in 2009 Lives alone. 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     Review of Systems GI:  Denies diarrhea, indigestion, nausea, and vomiting. GU:  Denies discharge, hematuria, and urinary hesitancy; No vulvar itiching. Marland Kitchen  Physical Exam  General:  alert and well-nourished.  No apparent distress.  Lungs:  normal respiratory effort, no intercostal retractions, no accessory muscle use, no crackles, and no wheezes.   Heart:  normal rate, regular rhythm, no murmur, and no gallop.   Abdomen:  soft, mild paraumbilical tenderness, no masses felt.  Carnett sign-no change in tenderness to palpation with abdominal wall tensing. (+) BS. no distention.  No bruits. No palp liver edge below costal margin. No palpable spleen tip.     Extremities:  No peripharal edema. Skin:  rough skin with skin peeling on soles of feet, particularly towards heel.  No pitting of sole of feet. No significant odoer. No erythema. No excoriation.     Impression & Recommendations:  Problem # 1:  FREQUENCY, URINARY (ICD-788.41) Possible UTI given Pyuria with nocturia and suprapublic tenderness.  Empiric treatment with keflex for 7 days pending urine culture. Orders: Urinalysis-[facility] (00000) Urine Culture-[facility] ([id number]) [facility]- Est Level  3 ([id number])  Problem # 2:  LEG EDEMA, BILATERAL (ICD-782.3)  Minimal.  Suspect secondary to Diltiazem antihypertensive therapy.  No cardiac symptoms. Given adequate BP control with Diltiazem at current dose and minimal nature of edema,  recommended that pt continue current Diltiazem at this dose.  Asked patient to let me know if the edema worsens.   Orders: [facility]- Est Level  3 ([id number])  Problem # 3:  LICHEN SCLEROSIS OF VULVA (ICD-624.8) Assessment: Comment Only  Dx by Dr Alycia Rossetti (GYN).  Not symptomatic currently. Referred Ms Gailey  to [facility] for consideration of colposcopic exam of Vulva given the slight increase risk of vulvar squamous cell cancer in patients with Lichen Sclerosis.   Orders: [facility]- Est Level  3 (21308)  Complete Medication List: 1)  Ambien 5 Mg Tabs (Zolpidem tartrate) .... One tablet by mouth 30 minutes before bedtime 2)  Cartia Xt 300 Mg Xr24h-cap (Diltiazem hcl coated beads) .... Take one capsule by mouth every day. for high blood pressure. 3)  Hydrocodone-acetaminophen 5-500 Mg Tabs (Hydrocodone-acetaminophen) .... 2 tablets by mouth twice a day as needed 4)  Milk of Magnesia 7.75 % Susp (Magnesium hydroxide) .... By mouth as needed 5)  Carmol 10 10 % Lotn (Urea) .... Apply twice a day to skin on soles of feet to remove dead, flaking skin. disp: one month supply 6)  Cephalexin 500 Mg Caps (Cephalexin) .... Take one tablet  by mouth three times a day for 7 days for bladder infection  Patient Instructions: 1)  Please schedule in [facility] with Dr Denny Levy for vulvar  Lichen sclerosis.  2)  Continue Taking your Cartia blood pressure medication every day.  It is controlling your blood pressure well.  It is likely causing some of your feeling of  leg swelling. 3)  Apply the carmol cream to skin on soles of feet twice a day to remove dead, flaking skin on soles.  May take 2 to 3 weeks to begin to see results.  4)  Take Cephalexin (antibiotic) for a possible bladder infection.  Dr Tinisha Etzkorn sent a culture of your urine to confirm if there is a bladder infection.  he will contact you with the culture results once they are back from the lab.  Prescriptions: CEPHALEXIN 500 MG  CAPS (CEPHALEXIN) Take one tablet by mouth three times a day for 7 days for bladder infection  #21 x 0   Entered and Authorized by:   Tawanna Cooler Kylyn Mcdade MD   Signed by:   Tawanna Cooler Sheikh Leverich MD on 01/01/2011   Method used:   Electronically to        [facility] [address] (retail)       [address] [place], [place]  [address]       Ph: [phone]       Fax: [phone]   RxID:   [id number] CARMOL 10 10 % LOTN (UREA) Apply twice a day to skin on soles of feet to remove dead, flaking skin. Disp: one month supply  #1 x PRN   Entered and Authorized by:   Tawanna Cooler Brightyn Mozer MD   Signed by:   Tawanna Cooler Aleeta Schmaltz MD on 01/01/2011   Method used:   Electronically to        [facility] [address] (retail)       [address] [place], [place]  [address]       Ph: [phone]       Fax: [phone]   RxID:   [id number] AMBIEN 5 MG TABS (ZOLPIDEM TARTRATE) One tablet by mouth 30 minutes before bedtime  #15 x 5   Entered and Authorized by:   Tawanna Cooler Minahil Quinlivan MD   Signed by:   Tawanna Cooler Octavion Mollenkopf MD on 01/01/2011   Method used:   Printed then faxed to ...       [facility] [address] (retail)       [address] [place], [place]  [address]       Ph: [phone]       Fax: [phone]   RxID:   [id number]    Orders Added: 1)  Urinalysis-[facility] [00000] 2)  Urine Culture-[facility] [[id number]] 3)  [facility]- Est Level  3 [[id number]]    Laboratory Results   Urine Tests  Date/Time Received: January 01, 2011 2:08 PM  Date/Time Reported: January 01, 2011 2:35 PM   Routine Urinalysis   Color: yellow Appearance: Clear Glucose: negative   (Normal Range: Negative) Bilirubin: negative   (Normal Range: Negative) Ketone: negative   (Normal Range: Negative) Spec. Gravity: 1.020   (Normal Range: 1.003-1.035) Blood: trace-intact   (Normal Range: Negative) pH: 5.5   (Normal Range:  5.0-8.0) Protein: negative   (Normal  Range: Negative) Urobilinogen: 0.2   (Normal Range: 0-1) Nitrite: negative   (Normal Range: Negative) Leukocyte Esterace: moderate   (Normal Range: Negative)  Urine Microscopic WBC/HPF: 15-20 RBC/HPF: rare Bacteria/HPF: 1+ Mucous/HPF: 2+ Epithelial/HPF: 1-5    Comments: 2 cc spun ...............test performed by......Marland KitchenBonnie A. [place], MLS (ASCP)cm

## 2011-01-18 NOTE — Assessment & Plan Note (Signed)
Summary: f/u per mcdiarmid/eo   Vital Signs:  Patient profile:   77 year old female Height:      59.0 inches Weight:      169.2 pounds BMI:     34.30 Temp:     98.2 degrees F oral Pulse rate:   76 / minute BP sitting:   132 / 77  (left arm) Cuff size:   regular  Vitals Entered By: Garen Grams LPN (November 30, 2010 10:06 AM) CC: f/u bp Pain Assessment Patient in pain? no        Primary Care Mashell Sieben:  Tawanna Cooler McDiarmid MD  CC:  f/u bp.  History of Present Illness: HYPERTENSION Disease Monitoring   Blood pressure range:not measuring at home   Chest pain:no   Dyspnea:no   Claudication:no  Medications   Compliance:Recent increase in Cartia to 300 mg daily.   Side effects   Lightheadedness: slightly "woozy" for about an hour after taking Cartia in morning.  No near syncope. Nor falls.   Urinary frequency:no   Edema:Has noted increase in bilateral sock imprints in skin of shins.      Habits & Providers  Alcohol-Tobacco-Diet     Alcohol drinks/day: 0     Tobacco Status: never  Current Medications (verified): 1)  Ambien 5 Mg Tabs (Zolpidem Tartrate) .... One Tablet By Mouth 30 Minutes Before Bedtime 2)  Cartia Xt 300 Mg Xr24h-Cap (Diltiazem Hcl Coated Beads) .... Take One Capsule By Mouth Every Day. For High Blood Pressure. 3)  Hydrocodone-Acetaminophen 5-500 Mg Tabs (Hydrocodone-Acetaminophen) .... 2 Tablets By Mouth Twice A Day As Needed 4)  Milk of Magnesia 7.75 % Susp (Magnesium Hydroxide) .... By Mouth As Needed  Allergies: 1)  * Estrogen  Past History:  Past medical history reviewed for relevance to current acute and chronic problems.  Past Medical History: Reviewed history from 05/10/2010 and no changes required. Hypertension, essential Hyperlipidemia Benign Neoplasm of Right Parotid Gland, removed in 1990,  Chronic nonspecific low back pain without radiculopathy that bagan after struck by [facility] in 1995. Spinal Stenosis, Lumbar, diffuse  thruoughout lumbar spine, maximal at L2-3 by MRI 11/10 (followed by Dr Estill Bamberg at [facility]) Spinal Stenosis, Thoracic, maximal at  T10 -T11 by MRI 11/10 Spondylolisthesis, L4-5 by MRI 11/10.  Foraminal stenosis, bilaterally at L4 and at L5 by MRI 11/10 Foraminal stenosis, right, T11 by MRI 11/10 S/P L3-4, L4-5 facet joint intra-articular injection, Dr Claria Dice [facility]) Recurrence of Right Parotid Gland tumor, right, followed by Dr Narda Bonds, monitoring, no planned intervention currently Right eye cataract  Pneumonia, April 2011 Hx of Fibromyalgia diagnosis by Dr Donette Larry Hearing loss in left ear, wears hearing aid.  Hx of Headaches, benign Hx of rectal prolapse after hysterectomy Lichen sclerosis of the vulva (followed by pt's gynecologist, Randel Pigg, MD) Sensorineural Hearing Loss, bilateral, Right greater than Left.  Left ear hearing aid b/c work discrimination in       R. ear is very poor. Audiologist-Stephanie Nance at [employer] in [place].   Physical Exam  General:  alert and well-nourished.  No apparent distress.  Lungs:  normal respiratory effort, no crackles, and no wheezes.   Heart:  normal rate, regular rhythm, no murmur, no gallop, and no JVD.   Extremities:  trace pretibial edema bilaterally to knees.  Neurologic:  Normal gait.  Rose from chair without difficulty.    Impression & Recommendations:  Problem # 1:  ESSENTIAL HYPERTENSION,  BENIGN (ICD-401.1) Assessment Improved  Adequate control. Tolerating medication though slight increase in pretibial trace edema from socks imprints.. No new organ damage. Plan to continue current medication. Ms Khalsa will contact me if the swelling in legs is worsening or more than a mere nuisance. Would consider starting a thiazide.    Her updated medication list for this problem includes:    Cartia Xt 300 Mg Xr24h-cap (Diltiazem hcl  coated beads) .Marland Kitchen... Take one capsule by mouth every day. for high blood pressure.  Orders: [facility]- Est Level  2 (95188)  Problem # 2:  LICHEN SCLEROSIS OF VULVA (ICD-624.8) Will set up appointment with [facility] in [employer] for surveillance for transformation to squamous cell cancer.  Not currently with itching.  Not using any topical steroid.  L. sclerosis first dx by Dr Leda Quail (GYN).   Complete Medication List: 1)  Ambien 5 Mg Tabs (Zolpidem tartrate) .... One tablet by mouth 30 minutes before bedtime 2)  Cartia Xt 300 Mg Xr24h-cap (Diltiazem hcl coated beads) .... Take one capsule by mouth every day. for high blood pressure. 3)  Hydrocodone-acetaminophen 5-500 Mg Tabs (Hydrocodone-acetaminophen) .... 2 tablets by mouth twice a day as needed 4)  Milk of Magnesia 7.75 % Susp (Magnesium hydroxide) .... By mouth as needed  Other Orders: Lipid-[facility] ([id number])  Patient Instructions: 1)  Please schedule a follow-up appointment in 4 months .  2)  Your blood pressure is under good control.  Keep taking the Cartia XT 300 mg talbet.  Consider taking it at bedtime if it makes you feel woosey during the day. 3)  If the swelling in your legs get worse, let Dr McDiarmid know.  We could consider starting a water pill to decrease the leg swelling from your Cartia Blood pressure medicine.  This water pill would also treat high pressure.  4)  Dr McDiarmid will contact you after the New Year about coming into  the [facility] for examination of your Lichen Sclerosis.    Orders Added: 1)  Lipid-[facility] [[id number]] 2)  [facility]- Est Level  2 [01093]     Prevention & Chronic Care Immunizations   Influenza vaccine: Not documented   Influenza vaccine deferral: patient declined  (11/06/2010)    Tetanus booster: 07/10/2010: Tdap    Pneumococcal vaccine: Historical  (12/18/2003)    H. zoster vaccine: 07/10/2010: refused   H. zoster vaccine deferral: declined   (07/10/2010)  Colorectal Screening   Hemoccult: Not documented   Hemoccult due: Not Indicated    Colonoscopy: normal  (12/18/2007)   Colonoscopy due: 12/17/2010  Other Screening   Pap smear: normal  (12/17/2006)   Pap smear due: Not Indicated    Mammogram: Normal  (07/11/2010)   Mammogram due: 07/2011    DXA bone density scan: normal  (08/17/2009)   DXA scan due: Not Indicated    Smoking status: never  (11/30/2010)  Lipids   Total Cholesterol: 180  (04/26/2009)   LDL: refused  (07/10/2010)   LDL Direct: 106  (04/26/2009)   HDL: 52  (04/26/2009)   Triglycerides: 112  (04/26/2009)    SGOT (AST): 21  (04/26/2009)   SGPT (ALT): 18  (04/26/2009)   Alkaline phosphatase: 66  (04/26/2009)   Total bilirubin: 0.4  (04/26/2009)  Hypertension   Last Blood Pressure: 132 / 77  (11/30/2010)   Serum creatinine: 0.80  (11/06/2010)   Serum potassium 3.6  (11/06/2010)    Hypertension flowsheet reviewed?: Yes   Progress toward BP  goal: At goal  Self-Management Support :   Personal Goals (by the next clinic visit) :      Personal blood pressure goal: 145/90  (07/10/2010)   Patient will work on the following items until the next clinic visit to reach self-care goals:     Medications and monitoring: take my medicines every day, bring all of my medications to every visit  (11/30/2010)     Eating: drink diet soda or water instead of juice or soda, use fresh or frozen vegetables, eat foods that are low in salt, eat baked foods instead of fried foods, eat fruit for snacks and desserts  (11/30/2010)    Hypertension self-management support: Written self-care plan  (11/30/2010)   Hypertension self-care plan printed.    Hypertension self-management support not done because: Good outcomes  (11/30/2010)    Lipid self-management support: Not documented     Lipid self-management support not done because: Good outcomes  (11/06/2010)

## 2011-01-18 NOTE — Progress Notes (Signed)
Summary: Reminder Phone call to patient: C/O nocturia and swelling.  Phone Note Outgoing Call   Call placed by: Tawanna Cooler Gurleen Larrivee MD,  December 25, 2010 10:39 AM Call placed to: Patient Summary of Call: I called patient to remind her about making appointment with our [facility]. She complained of nocturia and swelling in feet and abdomen. No dysuria.  No N/V.  Good appetitie.  No constipation or diarrhea.  No fever.  Will see pt on 1/16 in my afternoon clinic.  Red flags for earlier contact health system reviewed with pt.  Initial call taken by: Tawanna Cooler Caidence Kaseman MD,  December 25, 2010 10:41 AM

## 2011-01-18 NOTE — Letter (Signed)
Summary: [facility] Lipid Letter  [facility]  [address]   [place], [place] [address]   Phone: [phone]  Fax: [phone]    12/01/2010 MRN: [id number]  Nicole Perez [address] [place], [place]  [address]  Dear Nicole Perez:  I reviewed your last lipid profile from 11/30/2010 and the results are noted below.  You are at goal for your both your good and bad cholesterols.  Good Job!    Cholesterol:       156     Goal: <200   HDL "good" Cholesterol:   51     Goal: >40   LDL "bad" Cholesterol:   88     Goal: <130   Triglycerides:       85     Goal: <150   If you have any questions, please call. We appreciate being able to work with you.   Sincerely,   Tawanna Cooler Ericka Marcellus MD [facility] Tawanna Cooler Julann Mcgilvray MD     Appended Document: [facility] Lipid Letter mailed

## 2011-01-22 ENCOUNTER — Encounter: Payer: Self-pay | Admitting: *Deleted

## 2011-01-24 NOTE — Miscellaneous (Signed)
Summary: Procedure Consent  Procedure Consent   Imported By: De Nurse 01/16/2011 14:58:12  _____________________________________________________________________  External Attachment:    Type:   Image     Comment:   External Document

## 2011-01-26 ENCOUNTER — Encounter: Payer: Self-pay | Admitting: Home Health Services

## 2011-02-09 ENCOUNTER — Ambulatory Visit: Payer: Self-pay | Admitting: Home Health Services

## 2011-02-13 ENCOUNTER — Ambulatory Visit: Payer: Self-pay | Admitting: Home Health Services

## 2011-02-19 ENCOUNTER — Other Ambulatory Visit: Payer: Self-pay | Admitting: Family Medicine

## 2011-02-19 MED ORDER — HYDROCODONE-ACETAMINOPHEN 5-500 MG PO TABS: 2.0000 | ORAL_TABLET | Freq: Two times a day (BID) | ORAL | Status: DC | PRN

## 2011-03-07 ENCOUNTER — Other Ambulatory Visit: Payer: Self-pay | Admitting: Orthopedic Surgery

## 2011-03-07 ENCOUNTER — Encounter (HOSPITAL_BASED_OUTPATIENT_CLINIC_OR_DEPARTMENT_OTHER)
Admission: RE | Admit: 2011-03-07 | Discharge: 2011-03-07 | Disposition: A | Discharge status: Home / Self Care | Payer: MEDICARE | Source: Ambulatory Visit | Attending: Orthopedic Surgery | Admitting: Orthopedic Surgery

## 2011-03-07 ENCOUNTER — Ambulatory Visit
Admission: RE | Admit: 2011-03-07 | Discharge: 2011-03-07 | Disposition: A | Discharge status: Home / Self Care | Payer: MEDICARE | Source: Ambulatory Visit | Attending: Orthopedic Surgery | Admitting: Orthopedic Surgery

## 2011-03-07 DIAGNOSIS — R52 Pain, unspecified: Secondary | ICD-10-CM

## 2011-03-07 DIAGNOSIS — Z01810 Encounter for preprocedural cardiovascular examination: Secondary | ICD-10-CM | POA: Insufficient documentation

## 2011-03-07 DIAGNOSIS — Z01811 Encounter for preprocedural respiratory examination: Secondary | ICD-10-CM

## 2011-03-07 LAB — BASIC METABOLIC PANEL
BUN: 17 mg/dL (ref 6–23)
Chloride: 108 mEq/L (ref 96–112)
Creatinine, Ser: 0.91 mg/dL (ref 0.4–1.2)
Glucose, Bld: 96 mg/dL (ref 70–99)
Potassium: 4.8 mEq/L (ref 3.5–5.1)

## 2011-03-09 ENCOUNTER — Ambulatory Visit (HOSPITAL_BASED_OUTPATIENT_CLINIC_OR_DEPARTMENT_OTHER)
Admission: RE | Admit: 2011-03-09 | Discharge: 2011-03-09 | Disposition: A | Discharge status: Home / Self Care | Payer: MEDICARE | Source: Ambulatory Visit | Attending: Orthopedic Surgery | Admitting: Orthopedic Surgery

## 2011-03-09 DIAGNOSIS — M65849 Other synovitis and tenosynovitis, unspecified hand: Secondary | ICD-10-CM | POA: Insufficient documentation

## 2011-03-09 DIAGNOSIS — G5600 Carpal tunnel syndrome, unspecified upper limb: Secondary | ICD-10-CM | POA: Insufficient documentation

## 2011-03-09 DIAGNOSIS — Z01812 Encounter for preprocedural laboratory examination: Secondary | ICD-10-CM | POA: Insufficient documentation

## 2011-03-09 DIAGNOSIS — Z01810 Encounter for preprocedural cardiovascular examination: Secondary | ICD-10-CM | POA: Insufficient documentation

## 2011-03-09 DIAGNOSIS — M65839 Other synovitis and tenosynovitis, unspecified forearm: Secondary | ICD-10-CM | POA: Insufficient documentation

## 2011-03-09 DIAGNOSIS — M6530 Trigger finger, unspecified finger: Secondary | ICD-10-CM | POA: Insufficient documentation

## 2011-03-28 LAB — BASIC METABOLIC PANEL
BUN: 13 mg/dL (ref 6–23)
Chloride: 109 mEq/L (ref 96–112)
Creatinine, Ser: 0.77 mg/dL (ref 0.4–1.2)
Glucose, Bld: 108 mg/dL — ABNORMAL HIGH (ref 70–99)
Potassium: 4 mEq/L (ref 3.5–5.1)

## 2011-04-09 ENCOUNTER — Other Ambulatory Visit: Payer: Self-pay | Admitting: Family Medicine

## 2011-04-09 NOTE — Telephone Encounter (Signed)
Refill request

## 2011-04-19 ENCOUNTER — Ambulatory Visit: Payer: MEDICARE | Admitting: Family Medicine

## 2011-05-01 NOTE — Op Note (Signed)
Nicole Perez, Nicole Perez                 ACCOUNT NO.:  [id number]   MEDICAL RECORD NO.:  [id number]          PATIENT TYPE:  AMB   LOCATION:  DSC                          FACILITY:  [facility]   PHYSICIAN:  Cindee Salt, M.D.       DATE OF BIRTH:  12-15-1933   DATE OF PROCEDURE:  04/08/2009  DATE OF DISCHARGE:                               OPERATIVE REPORT   PREOPERATIVE DIAGNOSIS:  Carpal tunnel syndrome, left hand.   POSTOPERATIVE DIAGNOSIS:  Carpal tunnel syndrome, left hand.   OPERATION:  Median nerve decompression, left hand.   SURGEON:  Cindee Salt, MD   ASSISTANT:  Joaquin Courts, RN   ANESTHESIA:  General.   ANESTHESIOLOGIST:  Sheldon Silvan, MD   HISTORY:  The patient is a 77 year old female with a history of  bilateral carpal tunnel syndrome.  EMG nerve conductions positive, which  has not responded to conservative treatment.  She has elected to undergo  surgical decompression.  Pre, peri, and postoperative course have been  discussed along with risks and complications.  She is aware that there  is no guarantee with surgery; possibility of infection; recurrence of  injury to arteries, nerves, tendons, incomplete relief of symptoms; and  dystrophy.  In the preoperative area, the patient is seen.  The  extremity marked by both the patient and surgeon.  Antibiotic given.   PROCEDURE:  The patient was brought to the operating room where a  general anesthetic was carried out without difficulty under the  direction of Dr. Ivin Booty.  She was prepped using for ChloraPrep, supine  position with left arm free.  A time-out was taken allowing dry time.  A  time-out was taken then confirming the patient and procedure, she was  then draped.  The limb was exsanguinated with an Esmarch bandage,  tourniquet placed on forearm was inflated to 230 mmHg.  A longitudinal  incision was made in the palm and carried down through the subcutaneous  tissue.  Bleeders were electrocauterized.  Palmar fascia was  split,  superficial palmar arch identified, flexor tendon of the ring and little  finger identified to the ulnar side of the median nerve.  The carpal  retinaculum was incised with sharp dissection.  A right-angle and Sewall  retractor were placed between skin and forearm fascia.  The fascia  released for approximately a centimeter and half proximal to the wrist  crease under direct vision.  The canal was explored.  No further lesions  were identified.  An area of compression to the nerve with an hourglass  deformity was noted.  A local infiltration with 0.25% Marcaine without  epinephrine was given, 3 mL were used.  The wound was closed with  interrupted 5-0 Vicryl Rapide sutures.  Sterile compressive dressing and  splint to the wrist was applied.  The patient tolerated the procedure  well and was taken to the recovery room for observation in satisfactory  condition.  She will be discharged home to return to the [facility]  [place] in 1 week, on Vicodin.  ______________________________  Cindee Salt, M.D.     GK/MEDQ  D:  04/08/2009  T:  04/09/2009  Job:  [id number]   cc:   Georgann Housekeeper, MD

## 2011-05-04 NOTE — Procedures (Signed)
[facility]  Patient:    Nicole Perez, Nicole Perez                          MRN: [id number] Proc. Date: 09/02/00 Attending:  Verlin Grills, M.D. CC:         Modesta Messing, M.D.   Procedure Report  PROCEDURE:  Colonoscopy and polyp biopsy.  ENDOSCOPIST:  Verlin Grills, M.D.  REFERRING PHYSICIAN:  Modesta Messing, M.D.  INDICATIONS FOR PROCEDURE:  The patient is a 77 year old female undergoing colonoscopy to evaluate Hemoccult positive stool.  I discussed with the patient the complications associated with colonoscopy and polypectomy including intestinal bleeding and intestinal perforation.  The patient has signed the operative permit.  PREMEDICATION:  Demerol 50 mg and Versed 7 mg.  ENDOSCOPE:  Olympus pediatric colonoscope.  DESCRIPTION OF PROCEDURE:  After obtaining informed consent, the patient was placed in the left lateral decubitus position.  I administered intravenous Demerol and intravenous Versed to achieve conscious sedation for the procedure.  The patients blood pressure, oxygen saturation, and cardiac rhythm were monitored throughout the procedure and documented in the medical record.  Anal inspection was normal.  Digital rectal exam was normal.  The Olympus pediatric video colonoscope was introduced into the rectum and under direct vision advanced to the cecum as identified by a normal-appearing ileocecal valve.  Colonic preparation for the exam today was excellent except for solid stool in the cecum and solid stool in the hepatic flexure and splenic flexure.  Rectum and sigmoid:  There are numerous 1-2 mm sessile polyps in the rectum an distal sigmoid colon.  They were too numerous to remove completely with the endoscope.  These are very small polyps.  Multiple biopsies with the cold biopsy forceps were taken to sample the polyps to determine if these polyps are neoplastic or nonneoplastic.  Descending colon  normal.  Splenic flexure normal.  Transverse colon normal.  Hepatic flexure normal.  Ascending colon normal.  Cecum and ileocecal valve normal.  ASSESSMENT: 1. Large nonbleeding internal hemorrhoids. 2. Numerous 1-2 mm sessile polyps in the rectum and distal sigmoid colon;    biopsies pending. DD:  09/02/00 TD:  09/03/00 Job: [id number]

## 2011-05-04 NOTE — Op Note (Signed)
NAMELORALYN, Nicole Perez                 ACCOUNT NO.:  [id number]   MEDICAL RECORD NO.:  [id number]          PATIENT TYPE:  AMB   LOCATION:  ENDO                         FACILITY[facility]  [facility]   PHYSICIAN:  Danise Edge, M.D.   DATE OF BIRTH:  1933/10/08   DATE OF PROCEDURE:  03/07/2005  DATE OF DISCHARGE:                                 OPERATIVE REPORT   PROCEDURE:  Colonoscopy.   INDICATIONS:  Nicole Perez is a 77 year old female born 11/28/1933.  Nicole Perez underwent a screening colonoscopy in September 2001.  She had  many small polyps in the distal sigmoid colon and rectum.  Biopsy sampling  of the polyps returned hyperplastic and nonneoplastic polyps.   ENDOSCOPIST:  Danise Edge, M.D.   PREMEDICATION:  Versed 7 mg mg, Demerol 70 mg.   DESCRIPTION OF PROCEDURE:  After obtaining informed consent, Nicole Perez was  placed in the left lateral decubitus position.  I administered intravenous  Demerol and intravenous Versed to achieve conscious sedation for the  procedure.  The patient's blood pressure, oxygen saturation and cardiac  rhythm were monitored throughout the procedure and documented in the medical  record.   Anal inspection and digital rectal exam were normal.  The Olympus adjustable  pediatric colonoscope was introduced into the rectum and advanced to the  cecum.  Colonic preparation for the exam today was excellent.   Rectum and sigmoid colon:  Nicole Perez has many 1 mm to 2 mm hyperplastic-  appearing polyps in the rectum and distal sigmoid colon.  Biopsies were  performed from a number of these polyps to confirm they are still  hyperplastic.  Descending colon:  At 60 cm from the anal verge, a neoplastic-appearing 3 mm  sessile polyp was removed with the electrocautery snare.  Splenic flexure:  Normal.  Transverse colon:  Normal.  Hepatic flexure:  Normal.  Ascending colon:  Normal.  Cecum and ileocecal valve:  Normal.   ASSESSMENT:  1.  A small  neoplastic-appearing polyp was removed from the descending colon      at 60 cm from the anal verge.  2.  Nicole Perez has many small hyperplastic-appearing polyps in the rectum      and distal sigmoid colon with biopsies pending.      MJ/MEDQ  D:  03/07/2005  T:  03/07/2005  Job:  [id number]   cc:   Georgann Housekeeper, MD  [address]  [place]  [place] [address]  Fax: [phone]

## 2011-05-07 ENCOUNTER — Ambulatory Visit (INDEPENDENT_AMBULATORY_CARE_PROVIDER_SITE_OTHER): Payer: Medicare Other | Admitting: Family Medicine

## 2011-05-07 ENCOUNTER — Encounter: Payer: Self-pay | Admitting: Family Medicine

## 2011-05-07 DIAGNOSIS — Z79899 Other long term (current) drug therapy: Secondary | ICD-10-CM

## 2011-05-07 DIAGNOSIS — R5383 Other fatigue: Secondary | ICD-10-CM

## 2011-05-07 LAB — COMPREHENSIVE METABOLIC PANEL
ALT: 16 U/L (ref 0–35)
AST: 24 U/L (ref 0–37)
CO2: 21 mEq/L (ref 19–32)
Calcium: 9.1 mg/dL (ref 8.4–10.5)
Chloride: 104 mEq/L (ref 96–112)
Sodium: 142 mEq/L (ref 135–145)
Total Protein: 6.7 g/dL (ref 6.0–8.3)

## 2011-05-07 LAB — CBC WITH DIFFERENTIAL/PLATELET
Basophils Absolute: 0 10*3/uL (ref 0.0–0.1)
HCT: 36.3 % (ref 36.0–46.0)
Hemoglobin: 11.8 g/dL — ABNORMAL LOW (ref 12.0–15.0)
Lymphocytes Relative: 50 % — ABNORMAL HIGH (ref 12–46)
Lymphs Abs: 2 10*3/uL (ref 0.7–4.0)
MCH: 28.4 pg (ref 26.0–34.0)
MCHC: 32.5 g/dL (ref 30.0–36.0)
MCV: 87.3 fL (ref 78.0–100.0)
Neutro Abs: 1.5 10*3/uL — ABNORMAL LOW (ref 1.7–7.7)
Neutrophils Relative %: 36 % — ABNORMAL LOW (ref 43–77)
Platelets: 225 10*3/uL (ref 150–400)

## 2011-05-07 LAB — TSH: TSH: 2.792 u[IU]/mL (ref 0.350–4.500)

## 2011-05-07 NOTE — Patient Instructions (Signed)
Your blood pressure is under good control  .Dr McDiarmide will call you with the Blood test results

## 2011-05-07 NOTE — Progress Notes (Signed)
  Subjective:     EARLEAN FIDALGO is a 77 y.o. female who presents for evaluation of fatigue. Symptoms began several months ago. The patient feels the fatigue began with: no associated symptoms. Symptoms of her fatigue have been no depressive or ahedonic symptoms. Patient describes the following psychological symptoms: none. Patient denies constipation, fever, GI blood loss, significant change in weight and  difficulty with sleep. Symptoms have stabilized. Symptom severity: symptoms bothersome, but easily able to carry out all usual work/school/family activities. Previous visits for this problem: none.   The following portions of the patient's history were reviewed and updated as appropriate: past family history, past social history, past surgical history and problem list.  Review of Systems A comprehensive review of systems was negative.    Objective:    General appearance: alert, cooperative, appears stated age and no distress Head: atraumatic Neck: no adenopathy, no carotid bruit, no JVD, supple, symmetrical, trachea midline and thyroid not enlarged, symmetric, no tenderness/mass/nodules Lungs: clear to auscultation bilaterally Heart: regular rate and rhythm, S1, S2 normal, no murmur, click, rub or gallop Abdomen: soft, non-tender; bowel sounds normal; no masses,  no organomegaly Pulses: 2+ and symmetric    Assessment:    Fatigue, organic etiology unlikely based on careful history and exam.    Plan:    See orders for lab evaluation. Follow up in 2 month or as needed.

## 2011-05-07 NOTE — Progress Notes (Signed)
  Subjective:    Patient ID: Nicole Perez, female    DOB: August 12, 1933, 77 y.o.   MRN: [id number]  HPI CHRONIC HYPERTENSION  Disease Monitoring  Blood pressure range: not checking at home  Chest pain: no   Dyspnea: no   Claudication: no   Medication compliance: yes  Medication Side Effects  Lightheadedness: no   Urinary frequency: no   Edema: yes, mild ankle edema bilaterally    Preventitive Healthcare:  Exercise: no, secondary to fatigyue. Would like to gardern     Salt Restriction: reduced salt diet habit    Review of Systems     Objective:   Physical Exam        Assessment & Plan:

## 2011-07-06 NOTE — Op Note (Signed)
Nicole Perez, Nicole Perez                 ACCOUNT NO.:  [id number]  MEDICAL RECORD NO.:  [id number]            PATIENT TYPE:  LOCATION:                                 FACILITY:  PHYSICIAN:  Cindee Salt, M.D.            DATE OF BIRTH:  DATE OF PROCEDURE:  03/09/2011 DATE OF DISCHARGE:                              OPERATIVE REPORT   PREOPERATIVE DIAGNOSES:  Carpal tunnel syndrome, right hand; stenosing tenosynovitis, right ring finger.  POSTOPERATIVE DIAGNOSES:  Carpal tunnel syndrome, right hand; stenosing tenosynovitis, right ring finger.  OPERATION:  Release of A1 pulley, right ring finger; carpal tunnel release, right hand.  SURGEON:  Cindee Salt, MD  ASSISTANT:  None.  ANESTHESIA:  Forearm-based IV regional with local infiltration.  ANESTHESIOLOGIST:  Janetta Hora. Gelene Mink, MD  HISTORY:  The patient is a 77 year old female with history of carpal tunnel syndrome, EMG nerve conduction is positive.  She has developed triggering of her right ring finger since being scheduled, she is desirous of release of both.  Pre, peri, and postoperative course have been discussed along with risks and complications.  She is aware that there was no guarantee with surgery, possibility of infection, recurrence of injury to arteries, nerves, tendons, incomplete relief of symptoms, and dystrophy.  In preoperative area, the patient is seen, the extremity marked by both the patient and surgeon, and antibiotic given.  DESCRIPTION OF PROCEDURE:  The patient was brought to the operating room where a forearm-based IV regional anesthetic was carried out without difficulty.  She was prepped using ChloraPrep, supine position, with the right arm free.  A 3-minute dry time was allowed.  Time-out taken confirming the patient and procedure.  After adequate anesthesia was afforded, a longitudinal incision was made in the palm, carried down through subcutaneous tissue.  Bleeders were electrocauterized.   Palmar fascia was split, superficial palmar arch identified.  The flexor tendon to the ring little finger identified to the ulnar side of the median nerve.  The carpal retinaculum was incised with sharp dissection.  A right-angle and Sewall retractor were placed between skin and forearm fascia.  The fascia was released for approximately a 1.5 cm proximal to the wrist crease under direct vision.  The canal was explored. Hourglass deformity to the nerve was apparent.  No further lesions were identified.  The wound was irrigated.  Skin was closed with interrupted 4-0 Vicryl Rapide sutures.  A separate incision was then made over the A1 pulley of the right ring finger, carried down through the subcutaneous tissue.  Bleeders were again electrocauterized with bipolar.  Neurovascular structures were identified and protected. Retractors were placed.  The A1 pulley was released on its radial aspect.  A small incision was made centrally in A2.  Finger was placed through a full range motion and moderate tenosynovitis adherent proximal to the A1 pulley was identified.  A minimal debridement was performed. No further lesions were identified.  The wound was irrigated and closed with interrupted 4-0 Vicryl Rapide sutures.  Local infiltration with 0.25% Marcaine without epinephrine was then given, approximately 6  mL was used.  Sterile compressive dressing, splint to the wrist with the fingers free was applied.  On deflation of the tourniquet, all fingers were immediately pinked.  She was taken to the recovery room for observation in satisfactory condition.  She will be discharged home to return to the [facility] of [place] in 1 week, on Vicodin.          ______________________________ Cindee Salt, M.D.     GK/MEDQ  D:  03/09/2011  T:  03/10/2011  Job:  [id number]  Electronically Signed by Cindee Salt M.D. on 07/06/2011 09:04:18 AM

## 2011-07-09 ENCOUNTER — Other Ambulatory Visit: Payer: Self-pay | Admitting: Family Medicine

## 2011-07-09 DIAGNOSIS — Z1231 Encounter for screening mammogram for malignant neoplasm of breast: Secondary | ICD-10-CM

## 2011-07-18 ENCOUNTER — Ambulatory Visit (HOSPITAL_COMMUNITY)
Admission: RE | Admit: 2011-07-18 | Discharge: 2011-07-18 | Disposition: A | Discharge status: Home / Self Care | Payer: Medicare Other | Source: Ambulatory Visit | Attending: Family Medicine | Admitting: Family Medicine

## 2011-07-18 DIAGNOSIS — Z1231 Encounter for screening mammogram for malignant neoplasm of breast: Secondary | ICD-10-CM

## 2011-07-24 ENCOUNTER — Other Ambulatory Visit: Payer: Self-pay | Admitting: Otolaryngology

## 2011-07-26 ENCOUNTER — Ambulatory Visit (INDEPENDENT_AMBULATORY_CARE_PROVIDER_SITE_OTHER): Payer: Medicare Other | Admitting: Family Medicine

## 2011-07-26 ENCOUNTER — Encounter: Payer: Self-pay | Admitting: Family Medicine

## 2011-07-26 ENCOUNTER — Ambulatory Visit: Payer: Medicare Other | Admitting: Family Medicine

## 2011-07-26 VITALS — BP 151/83 | HR 97 | Temp 97.6°F | Wt 169.0 lb

## 2011-07-26 DIAGNOSIS — IMO0001 Reserved for inherently not codable concepts without codable children: Secondary | ICD-10-CM

## 2011-07-26 DIAGNOSIS — I10 Essential (primary) hypertension: Secondary | ICD-10-CM

## 2011-07-26 DIAGNOSIS — M48061 Spinal stenosis, lumbar region without neurogenic claudication: Secondary | ICD-10-CM | POA: Insufficient documentation

## 2011-07-26 DIAGNOSIS — M4804 Spinal stenosis, thoracic region: Secondary | ICD-10-CM

## 2011-07-26 DIAGNOSIS — N904 Leukoplakia of vulva: Secondary | ICD-10-CM

## 2011-07-26 DIAGNOSIS — H903 Sensorineural hearing loss, bilateral: Secondary | ICD-10-CM

## 2011-07-26 DIAGNOSIS — M25549 Pain in joints of unspecified hand: Secondary | ICD-10-CM | POA: Insufficient documentation

## 2011-07-26 DIAGNOSIS — Z8701 Personal history of pneumonia (recurrent): Secondary | ICD-10-CM

## 2011-07-26 DIAGNOSIS — D110 Benign neoplasm of parotid gland: Secondary | ICD-10-CM | POA: Insufficient documentation

## 2011-07-26 DIAGNOSIS — K116 Mucocele of salivary gland: Secondary | ICD-10-CM

## 2011-07-26 DIAGNOSIS — M1990 Unspecified osteoarthritis, unspecified site: Secondary | ICD-10-CM

## 2011-07-26 DIAGNOSIS — M7918 Myalgia, other site: Secondary | ICD-10-CM | POA: Insufficient documentation

## 2011-07-26 DIAGNOSIS — M549 Dorsalgia, unspecified: Secondary | ICD-10-CM

## 2011-07-26 DIAGNOSIS — L940 Localized scleroderma [morphea]: Secondary | ICD-10-CM

## 2011-07-26 HISTORY — DX: Personal history of pneumonia (recurrent): Z87.01

## 2011-07-26 HISTORY — DX: Spinal stenosis, thoracic region: M48.04

## 2011-07-26 HISTORY — DX: Spinal stenosis, lumbar region without neurogenic claudication: M48.061

## 2011-07-26 HISTORY — DX: Sensorineural hearing loss, bilateral: H90.3

## 2011-07-26 MED ORDER — HYDROCODONE-ACETAMINOPHEN 5-500 MG PO TABS: 1.0000 | ORAL_TABLET | Freq: Two times a day (BID) | ORAL | Status: DC | PRN

## 2011-07-26 NOTE — Assessment & Plan Note (Deleted)
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  Wears hearing aid.

## 2011-07-26 NOTE — Assessment & Plan Note (Signed)
Working diagnosis of Osteoarthritis of hands, bilaterally.   Will check ACCP level to look for evidence of RA, but suspect this is OA.  Advised pt may take 1000 mg APAP at bedtime. Advised warm parafin wax soaks in am to help with early moring pain and stiffness.  Pt to see Dr Merlyn Lot in follow up of her right wrist carpel tunnel surgery soon.  Encourgaed pt to see if he had any suggestions for symptom comtrol .

## 2011-07-26 NOTE — Progress Notes (Signed)
  Subjective:    Patient ID: Nicole Perez, female    DOB: [birth year], 77 y.o.   MRN: [id number]  HPI Right buttock and lateral hip pain  Onset last week while lifting a bird bath. Acute onset in right low back/buttock.  Aching continuously since.  Worse with walking, especially with stepping up with right leg.  Fell walking into our office today when stepping up on parking lot curb to sidewalk., struck right knee cap.  Does not use cane to assist with ambulation. No knee pain currently. Better with rest and vicodin.  Radiates down right posterolateral thigh.  Does not radiate below knee.  No weakness in leg.  No improvement over time.   Bilateral Hand pain  Onset slowly since right carpel release.  Located mostly in [place] of pointer and middle finger.  Worse in morning. She has to manually extend her fingers of both hands in the morning b/c of stiffness. The stiffness lasts less than 30 minutes.  She denies impairment in grasping and strength in hands.  Some improvement when she takes her vicodin.  No redness or swelling of hands or fingers.  No recalled acute trauma to hands.  No fever.  No night sweats.  No new pains in knees or ankles or feet.   CHRONIC HYPERTENSION  Disease Monitoring  Blood pressure range: not measuring at home  Chest pain: no   Dyspnea: no   Claudication: no   Medication compliance: yes  Medication Side Effects  Lightheadedness: yes   Urinary frequency: no   Edema: no   Medications, past medical history,  family history, social history were reviewed and updated.   Review of Systems See HPI     Objective:   Physical Exam  Constitutional: No distress.       Atlagic gait.  Rising from chair without assistance of use of chair arms.  Cardiovascular: Normal rate, regular rhythm, normal heart sounds and intact distal pulses.   Pulses:      Radial pulses are 1+ on the right side, and 1+ on the left side.  Pulmonary/Chest: Effort normal and breath sounds  normal.  Musculoskeletal:       Lumbar back: She exhibits no swelling, no edema and no spasm.       Back:       Hands:         Assessment & Plan:

## 2011-07-26 NOTE — Assessment & Plan Note (Addendum)
Need next colposcopic surveillance in January 2013.

## 2011-07-26 NOTE — Assessment & Plan Note (Signed)
Point tenderness in right iliac fossa region, suspect gluteal m. Origin of pain from tear or strain of musculotendinous tissue during lifting activity.  No red flags at this time.   S/p tender point injection with 40 mg Kenalog and 4 ml lidocaine 1% after sterile prepe of skin.  No complications.  Pt to notify me if this injedction does not help.

## 2011-07-26 NOTE — Patient Instructions (Signed)
I suspect your hand pain and morning stiffness is due to osteoarthritis of the hands  Consider taking Acetaminophen (Tylenol) 1000 mg at bedtime to see if if will help with the morning pain in the hands  Warm parafin wax soaking devices are helpful for easing the pain and stiffness in your hands.   You can get the devices in the Beauty sections of most department stores and some drug stores.   See if you can get an automatic blood pressure cuff through your insurance using a doctor's prescription.

## 2011-07-26 NOTE — Assessment & Plan Note (Signed)
Vidocin is helping with pain in hips, back and hands and improves her level of functioning for independent activities of living.  No significant adverse effects except periodic constipation which pt manages with MOM.  Rx Vicodin 5/500 one tablet bid prn. #60, RF-3.  RTC 3 month.

## 2011-07-26 NOTE — Assessment & Plan Note (Addendum)
Recurrence of Right Parotid Gland tumor, right, followed by Dr Narda Bonds, plan is monitoring.

## 2011-07-26 NOTE — Assessment & Plan Note (Signed)
Not at goal BP.  Suspect secondary to current acute pain in buttock.   No clinical evidence of new end organ damage. Tolerating the diltiazem.  Plan to continue medication at current dose and schedule.  Gave pt a Rx for home BP automated monitor for daily BP monitoring.  Pt to bring measurements to next OV in 3 months.

## 2011-08-01 ENCOUNTER — Other Ambulatory Visit: Payer: Self-pay | Admitting: Family Medicine

## 2011-08-01 DIAGNOSIS — I10 Essential (primary) hypertension: Secondary | ICD-10-CM

## 2011-08-01 NOTE — Telephone Encounter (Signed)
Refill request

## 2011-08-02 ENCOUNTER — Other Ambulatory Visit: Payer: Self-pay | Admitting: Otolaryngology

## 2011-08-02 DIAGNOSIS — K118 Other diseases of salivary glands: Secondary | ICD-10-CM

## 2011-08-06 ENCOUNTER — Ambulatory Visit
Admission: RE | Admit: 2011-08-06 | Discharge: 2011-08-06 | Disposition: A | Discharge status: Home / Self Care | Payer: Medicare Other | Source: Ambulatory Visit | Attending: Otolaryngology | Admitting: Otolaryngology

## 2011-08-06 DIAGNOSIS — K118 Other diseases of salivary glands: Secondary | ICD-10-CM

## 2011-08-06 MED ORDER — IOHEXOL 300 MG/ML  SOLN
75.0000 mL | Freq: Once | INTRAMUSCULAR | Status: AC | PRN
  Administered 2011-08-06: 75 mL via INTRAVENOUS

## 2011-08-21 ENCOUNTER — Telehealth: Payer: Self-pay | Admitting: Family Medicine

## 2011-08-21 DIAGNOSIS — G4700 Insomnia, unspecified: Secondary | ICD-10-CM

## 2011-08-21 NOTE — Telephone Encounter (Signed)
Nicole Perez is concerned because she can't get any sleep.  She thinks that maybe she is concerned that it may be because of her surgery for next week.  She wants Dr. McDiarmid to send an Rx for something to help her to sleep to her pharmacy.

## 2011-08-22 MED ORDER — ZOLPIDEM TARTRATE 5 MG PO TABS: 5.0000 mg | ORAL_TABLET | Freq: Every evening | ORAL | Status: DC | PRN

## 2011-08-22 NOTE — Telephone Encounter (Signed)
I spoke with Nicole Perez.  She believes she could not sleep because she is anxious about her parotid tumor surgery tomorrow and because she does not have anymore Ambien.  Will call in Rx for Ambien.

## 2011-08-30 ENCOUNTER — Other Ambulatory Visit: Payer: Self-pay | Admitting: Otolaryngology

## 2011-08-30 ENCOUNTER — Other Ambulatory Visit: Payer: Self-pay | Admitting: Family Medicine

## 2011-08-30 HISTORY — PX: PAROTIDECTOMY: SUR1003

## 2011-08-30 NOTE — Telephone Encounter (Signed)
Refill request

## 2011-09-04 ENCOUNTER — Encounter: Payer: Self-pay | Admitting: Family Medicine

## 2011-11-07 ENCOUNTER — Other Ambulatory Visit: Payer: Self-pay | Admitting: Family Medicine

## 2011-11-07 MED ORDER — HYDROCODONE-ACETAMINOPHEN 5-500 MG PO TABS: 1.0000 | ORAL_TABLET | Freq: Two times a day (BID) | ORAL | Status: DC | PRN

## 2011-11-16 ENCOUNTER — Encounter: Payer: Self-pay | Admitting: Family Medicine

## 2011-11-16 ENCOUNTER — Ambulatory Visit (INDEPENDENT_AMBULATORY_CARE_PROVIDER_SITE_OTHER): Payer: Medicare Other | Admitting: Family Medicine

## 2011-11-16 VITALS — BP 148/80 | HR 89 | Temp 98.0°F | Ht <= 58 in | Wt 172.4 lb

## 2011-11-16 DIAGNOSIS — R0989 Other specified symptoms and signs involving the circulatory and respiratory systems: Secondary | ICD-10-CM

## 2011-11-16 DIAGNOSIS — N904 Leukoplakia of vulva: Secondary | ICD-10-CM

## 2011-11-16 DIAGNOSIS — L940 Localized scleroderma [morphea]: Secondary | ICD-10-CM

## 2011-11-16 DIAGNOSIS — I10 Essential (primary) hypertension: Secondary | ICD-10-CM

## 2011-11-16 LAB — CBC WITH DIFFERENTIAL/PLATELET
Eosinophils Absolute: 0.2 10*3/uL (ref 0.0–0.7)
Eosinophils Relative: 4 % (ref 0–5)
HCT: 37.1 % (ref 36.0–46.0)
Lymphocytes Relative: 43 % (ref 12–46)
Lymphs Abs: 2.2 10*3/uL (ref 0.7–4.0)
MCH: 28.8 pg (ref 26.0–34.0)
MCV: 87.7 fL (ref 78.0–100.0)
Monocytes Absolute: 0.4 10*3/uL (ref 0.1–1.0)
Platelets: 252 10*3/uL (ref 150–400)
RBC: 4.23 MIL/uL (ref 3.87–5.11)
RDW: 13.7 % (ref 11.5–15.5)
WBC: 5.1 10*3/uL (ref 4.0–10.5)

## 2011-11-16 LAB — COMPREHENSIVE METABOLIC PANEL
BUN: 12 mg/dL (ref 6–23)
CO2: 23 mEq/L (ref 19–32)
Calcium: 9 mg/dL (ref 8.4–10.5)
Chloride: 109 mEq/L (ref 96–112)
Creat: 0.78 mg/dL (ref 0.50–1.10)
Glucose, Bld: 84 mg/dL (ref 70–99)
Total Bilirubin: 0.4 mg/dL (ref 0.3–1.2)

## 2011-11-16 MED ORDER — DILTIAZEM HCL ER 240 MG PO CP24: 240.0000 mg | ORAL_CAPSULE | Freq: Every day | ORAL | Status: DC

## 2011-11-16 NOTE — Progress Notes (Signed)
  Subjective:    Patient ID: Nicole Perez, female    DOB: Jan 09, 1933, 77 y.o.   MRN: [id number]  HPI Accompanied by dgt.  CHRONIC HYPERTENSION  Disease Monitoring  Blood pressure range: does not measure at home  Chest pain: no   Dyspnea: yes   Claudication: no   Medication compliance: yes  Medication Side Effects  Lightheadedness: yes   Urinary frequency: no   Edema: no     Preventitive Healthcare:  Exercise: yes, working in yeard    Dyspnea  Onset 3 to 4  Months ago.  Not progressive.  Only with heavier exertion like raking leaves. No cough or wheezing.  No chest pain.  No leg swelling.  No shortness of breath at rest.  No or;thopnea. No PND. No fever/chills. No new leg pains. No increase sputum. No melena or hematochezia.   [name] feels dypnea started with increase of patient's diltiazem to 300 mg daily.   Review of Systems See HPI     Objective:   Physical Exam  Constitutional: Vital signs are normal. No distress.  Eyes: Conjunctivae are normal.       No conjunctival paleness.   Neck: No JVD present. Carotid bruit is not present. No edema present.  Cardiovascular: Normal rate, regular rhythm and normal heart sounds.  Exam reveals no gallop and no friction rub.   No murmur heard. Pulmonary/Chest: Effort normal and breath sounds normal. No stridor. No respiratory distress. She has no rales. She exhibits no tenderness.  Abdominal: Soft. Bowel sounds are normal.  Neurological: She is alert.          Assessment & Plan:

## 2011-11-16 NOTE — Assessment & Plan Note (Addendum)
Will try trial at decreased dose of diltizaem of 240 mg daily ER to see if her dyspnea on exertion improves without loss of bp control.  Mailing Rx for BP home monitor for patient to take to medical supply shop. RTC in 2 weeks.

## 2011-11-16 NOTE — Patient Instructions (Signed)
Decreasing the Diltiazem Blood pressure medication form 300 mg to back to 240 mg daily. You will receive the results of your lab results within 2 weeks. If you do not receive results; please call.

## 2011-11-16 NOTE — Assessment & Plan Note (Addendum)
Will look for cardiopulmonary origins of DOE.  No significant desaturation with ambulation in office today. Check Hg, CMET, BNP, CXR.  Decrease Diltiazem dose to see if it is contributing to DOE.

## 2011-11-16 NOTE — Assessment & Plan Note (Signed)
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

## 2011-11-17 LAB — BRAIN NATRIURETIC PEPTIDE: Brain Natriuretic Peptide: 9.8 pg/mL (ref 0.0–100.0)

## 2011-12-06 ENCOUNTER — Ambulatory Visit (INDEPENDENT_AMBULATORY_CARE_PROVIDER_SITE_OTHER): Payer: Medicare Other | Admitting: Family Medicine

## 2011-12-06 ENCOUNTER — Encounter: Payer: Self-pay | Admitting: Family Medicine

## 2011-12-06 DIAGNOSIS — I10 Essential (primary) hypertension: Secondary | ICD-10-CM

## 2011-12-06 DIAGNOSIS — G4700 Insomnia, unspecified: Secondary | ICD-10-CM

## 2011-12-06 DIAGNOSIS — R0609 Other forms of dyspnea: Secondary | ICD-10-CM

## 2011-12-06 MED ORDER — ZOLPIDEM TARTRATE 5 MG PO TABS: 5.0000 mg | ORAL_TABLET | Freq: Every evening | ORAL | Status: DC | PRN

## 2011-12-07 ENCOUNTER — Encounter: Payer: Self-pay | Admitting: Family Medicine

## 2011-12-07 ENCOUNTER — Telehealth: Payer: Self-pay | Admitting: *Deleted

## 2011-12-07 NOTE — Assessment & Plan Note (Signed)
Improved with decrease in Diltiazem dose.  Unremarkable CMET/CBC/Pro-BNP.  Adequate BP control with lower Diltiazem daily dose. Plan to continue Diltazem 240 CD daily.  No further work-up at this time.

## 2011-12-07 NOTE — Progress Notes (Signed)
  Subjective:    Patient ID: Nicole Perez, female    DOB: 22-Apr-1933, 77 y.o.   MRN: [id number]  HPI  Accompanied by dgt CHRONIC HYPERTENSION  Disease Monitoring  Blood pressure range: not measuring at home  Chest pain: no   Dyspnea: no, improved since decrease in dose of diltiazem   Claudication: no   Medication compliance: yes  Medication Side Effects  Lightheadedness: no   Urinary frequency: no   Edema: no   Preventitive Healthcare:  Exercise: yes, ADLs and iADLs     Insomnia, chronic Difficulty falling asleep since her type of generic zolpidem was switched to different shaped pill.  Increased feeling of sleepiness during the day which patient resists.  She does not nap during day. No shortness or breath, nocturia or pain that is preventing her from falling asleep or awakening her from her sleep.      Review of Systems     Objective:   Physical Exam General: NAD VS normal, alert and cooperative.  Lungs: BCTA, no acc mm use Core: RRR, No M/G/R, No JVD Ext: only trace ankle edema.          Assessment & Plan:

## 2011-12-07 NOTE — Assessment & Plan Note (Signed)
Decline in effectiveness of sleep induction since switch in pill shape (? Generic switch). Will prescribe Ambien (DAW).  Patient and dgt understand that this will likely increase the cost of the prescription .

## 2011-12-07 NOTE — Telephone Encounter (Signed)
PA obtained for name brand Ambien. Pharmacy notified.

## 2011-12-07 NOTE — Patient Instructions (Signed)
W

## 2011-12-07 NOTE — Assessment & Plan Note (Signed)
Adequate blood pressure control.  No evidence of new end organ damage.  Tolerating medication without significant adverse effects.  Plan to continue current blood pressure regiment.   

## 2012-01-10 ENCOUNTER — Other Ambulatory Visit: Payer: Self-pay | Admitting: Family Medicine

## 2012-01-10 MED ORDER — ZOLPIDEM TARTRATE 5 MG PO TABS: 5.0000 mg | ORAL_TABLET | Freq: Every evening | ORAL | Status: DC | PRN

## 2012-01-10 NOTE — Progress Notes (Signed)
[email or web address] gave third party rejection of appeal for payment for brand name Ambien  Will change Rx to generic Zolpidem and send to [facility].

## 2012-01-14 ENCOUNTER — Other Ambulatory Visit: Payer: Self-pay | Admitting: Family Medicine

## 2012-01-14 MED ORDER — DILTIAZEM HCL ER 240 MG PO CP24: 240.0000 mg | ORAL_CAPSULE | Freq: Every day | ORAL | Status: DC

## 2012-01-14 NOTE — Telephone Encounter (Signed)
Nicole Perez is needing a refill on her BP medication.  She did not know the name of this medication.

## 2012-02-21 ENCOUNTER — Ambulatory Visit (INDEPENDENT_AMBULATORY_CARE_PROVIDER_SITE_OTHER): Payer: Medicare Other | Admitting: Family Medicine

## 2012-02-21 ENCOUNTER — Other Ambulatory Visit: Payer: Self-pay

## 2012-02-21 ENCOUNTER — Encounter: Payer: Self-pay | Admitting: Family Medicine

## 2012-02-21 ENCOUNTER — Ambulatory Visit (HOSPITAL_COMMUNITY)
Admission: RE | Admit: 2012-02-21 | Discharge: 2012-02-21 | Disposition: A | Discharge status: Home / Self Care | Payer: Medicare Other | Source: Ambulatory Visit | Attending: Family Medicine | Admitting: Family Medicine

## 2012-02-21 VITALS — BP 140/76 | HR 84 | Temp 97.6°F | Ht <= 58 in | Wt 171.0 lb

## 2012-02-21 DIAGNOSIS — R5383 Other fatigue: Secondary | ICD-10-CM

## 2012-02-21 DIAGNOSIS — I440 Atrioventricular block, first degree: Secondary | ICD-10-CM | POA: Insufficient documentation

## 2012-02-21 DIAGNOSIS — I10 Essential (primary) hypertension: Secondary | ICD-10-CM

## 2012-02-21 DIAGNOSIS — R5381 Other malaise: Secondary | ICD-10-CM

## 2012-02-21 DIAGNOSIS — R0789 Other chest pain: Secondary | ICD-10-CM

## 2012-02-21 LAB — COMPREHENSIVE METABOLIC PANEL
ALT: 11 U/L (ref 0–35)
AST: 17 U/L (ref 0–37)
Albumin: 4.4 g/dL (ref 3.5–5.2)
CO2: 25 mEq/L (ref 19–32)
Calcium: 9.5 mg/dL (ref 8.4–10.5)
Chloride: 103 mEq/L (ref 96–112)
Creat: 0.83 mg/dL (ref 0.50–1.10)
Potassium: 4 mEq/L (ref 3.5–5.3)
Sodium: 138 mEq/L (ref 135–145)
Total Protein: 6.8 g/dL (ref 6.0–8.3)

## 2012-02-21 LAB — CBC
HCT: 37.8 % (ref 36.0–46.0)
MCHC: 32.5 g/dL (ref 30.0–36.0)
Platelets: 272 10*3/uL (ref 150–400)
RDW: 13.2 % (ref 11.5–15.5)
WBC: 5.2 10*3/uL (ref 4.0–10.5)

## 2012-02-21 MED ORDER — DILTIAZEM HCL ER 240 MG PO CP24: 240.0000 mg | ORAL_CAPSULE | Freq: Every day | ORAL | Status: DC

## 2012-02-21 MED ORDER — HYDROCODONE-ACETAMINOPHEN 5-500 MG PO TABS: 1.0000 | ORAL_TABLET | Freq: Two times a day (BID) | ORAL | Status: DC | PRN

## 2012-02-21 NOTE — Progress Notes (Signed)
  Subjective:    Patient ID: Nicole Perez, female    DOB: [birth year], 78 y.o.   MRN: [id number]  HPI CHEST PAIN  Location: right anterior chest Quality: sharp Duration: 1 to 2 seconds Onset (rest, exertion): rest, lying in bed. Occurred several times a day last week for couple days.  No pain for last 4 to 5 days.  Radiation: none Better with: nothing Worse with: no relation to position, movement, food  Symptoms History of Trauma/lifting: no  Nausea/vomiting: no  Diaphoresis: no  Shortness of breath: no  Pleuritic: no  Cough: no  Edema: no  Orthopnea: no  PND: no  Dizziness: no  Palpitations: no  Syncope: no  Indigestion: yes   Red Flags Worse with exertion: no  Recent Immobility: no  Cancer history: no  Tearing/radiation to back: no   SH: No smoking Medications reviewed and updated in medication list.     Review of Systems See HPI     Objective:   Physical Exam  Constitutional: Vital signs are normal. No distress.  HENT:  Right Ear: Tympanic membrane and ear canal normal.  Left Ear: Tympanic membrane and ear canal normal.  Eyes: Conjunctivae are normal.  Neck: No hepatojugular reflux and no JVD present.  Cardiovascular: Normal rate, regular rhythm, S1 normal and normal heart sounds.  PMI is not displaced.   Pulses:      Dorsalis pedis pulses are 1+ on the right side, and 1+ on the left side.       Posterior tibial pulses are 1+ on the right side, and 1+ on the left side.  Pulmonary/Chest: Effort normal and breath sounds normal.  Abdominal: Soft. Normal appearance and bowel sounds are normal. She exhibits no mass. There is no hepatosplenomegaly. There is no tenderness.  Musculoskeletal:       Arms: Lymphadenopathy:    She has no cervical adenopathy.    She has no axillary adenopathy.       Right: No inguinal adenopathy present.       Left: No inguinal adenopathy present.  Neurological: She is alert. She is not disoriented.  Psychiatric: She has a normal mood  and affect. Her speech is normal. Thought content normal. Her mood appears not anxious. Cognition and memory are normal. Cognition and memory are not impaired. She does not exhibit a depressed mood.   EKG: NSR, 1st degree AVB, No significant ST or T-wave changes. No evidence of chamber enlargements. Muscle tremor artifact but able to see ST and T-wave in precordial leads well.       Assessment & Plan:

## 2012-02-21 NOTE — Assessment & Plan Note (Addendum)
Adequate blood pressure control.  No evidence of new end organ damage.  Tolerating medication without significant adverse effects.  Plan to continue current blood pressure regiment. Check BMET for end organ changes and medication side effects.

## 2012-02-21 NOTE — Assessment & Plan Note (Signed)
No current chest pain. Atypical in presentation, sharp, lasting seconds. Suspect UGI or musculoskeltal in origin. Continue aspirin daily. Patient to notify us if pain returns

## 2012-02-21 NOTE — Patient Instructions (Addendum)
I suspect your chest pain was from a muscle strain or from indigestion. If chest pain returns, notify Dr Dallyn Bergland's office.  Your blood pressure looks good. Continue your Diltiazem.  You will receive the results of your lab results within 2 weeks. If you do not receive results; please call.

## 2012-02-22 ENCOUNTER — Encounter: Payer: Self-pay | Admitting: Family Medicine

## 2012-03-05 ENCOUNTER — Encounter: Payer: Self-pay | Admitting: Family Medicine

## 2012-03-05 ENCOUNTER — Ambulatory Visit (INDEPENDENT_AMBULATORY_CARE_PROVIDER_SITE_OTHER): Payer: Medicare Other | Admitting: Family Medicine

## 2012-03-05 VITALS — BP 148/88 | HR 90 | Temp 97.6°F | Ht 59.0 in | Wt 174.9 lb

## 2012-03-05 DIAGNOSIS — N904 Leukoplakia of vulva: Secondary | ICD-10-CM

## 2012-03-05 DIAGNOSIS — L940 Localized scleroderma [morphea]: Secondary | ICD-10-CM

## 2012-03-05 MED ORDER — CLOBETASOL PROPIONATE 0.05 % EX CREA: TOPICAL_CREAM | CUTANEOUS | Status: DC

## 2012-03-06 NOTE — Assessment & Plan Note (Signed)
Slight increase in symptoms and external changes consistent with mild LS. Will restart teh clobetasol 3 x per week. Given her limited symptoms at this tim,I think this will likely handle her symptoms. If not, she will call. Plan repeat eval in one year in [facility] clinic.

## 2012-03-06 NOTE — Progress Notes (Signed)
  Subjective:    Patient ID: Nicole Perez, female    DOB: January 18, 1933, 78 y.o.   MRN: [id number]  HPI Here for yearly recheck of lichen sclerosis.  Has noticed some increased irritation--especially with urination---urine causes external burning. No increase in frequency. No bladder pain. No vaginal bleeding or discharge.   Review of Systems Denies fever, pelvic pain, abdominal pain. See also HPI.    Objective:   Physical Exam  Vital signs reviewed. GENERAL: Well developed, well nourished, no acute distress Colposcopic exam external genitalia: Patient given informed consent, signed copy in the chart.  Placed in lithotomy position.  Inner mucosa of labia majora noted to be opaque and thin in appearance consistent with LS. No specific lesion noted. No defortmity or strictures noted.  Acetowhite lesions?no  Punctation?no Mosaicism?  no Abnormal vasculature?  no Biopsies?no  Complications? no  COMMENTS: Patient was given post procedure instructions.  I will notify her of any pathology results.       Assessment & Plan:

## 2012-06-09 ENCOUNTER — Telehealth: Payer: Self-pay | Admitting: Family Medicine

## 2012-06-09 MED ORDER — HYDROCODONE-ACETAMINOPHEN 5-500 MG PO TABS: 1.0000 | ORAL_TABLET | Freq: Two times a day (BID) | ORAL | Status: DC | PRN

## 2012-06-09 NOTE — Telephone Encounter (Signed)
Patient is calling because her pharmacy has told her that her MD needs to call in her prescriptions for Hydrocodone.

## 2012-06-09 NOTE — Telephone Encounter (Signed)
Please let Ms Hey know I phoned in her prescription for Vicodin.  She should make an appointment to see Tama Grosz when in September to follow up her blood pressure and arthritis.

## 2012-06-09 NOTE — Telephone Encounter (Signed)
Attempted to call pt, but no answer and no machine.  Will wait for a callback. Renold Kozar, [name]

## 2012-07-30 ENCOUNTER — Encounter: Payer: Self-pay | Admitting: Family Medicine

## 2012-07-30 ENCOUNTER — Ambulatory Visit (INDEPENDENT_AMBULATORY_CARE_PROVIDER_SITE_OTHER): Payer: Medicare Other | Admitting: Family Medicine

## 2012-07-30 VITALS — BP 163/85 | HR 90 | Temp 97.6°F | Ht 59.0 in | Wt 174.0 lb

## 2012-07-30 DIAGNOSIS — R5383 Other fatigue: Secondary | ICD-10-CM

## 2012-07-30 DIAGNOSIS — R5381 Other malaise: Secondary | ICD-10-CM

## 2012-07-30 DIAGNOSIS — G4700 Insomnia, unspecified: Secondary | ICD-10-CM

## 2012-07-30 DIAGNOSIS — I10 Essential (primary) hypertension: Secondary | ICD-10-CM

## 2012-07-30 MED ORDER — ZOLPIDEM TARTRATE 5 MG PO TABS: 5.0000 mg | ORAL_TABLET | Freq: Every evening | ORAL | Status: DC | PRN

## 2012-07-30 NOTE — Patient Instructions (Addendum)
It was nice to meet you.  I will send you a letter with your lab results.  Please continue to try to be as active as possible.

## 2012-07-31 LAB — BASIC METABOLIC PANEL
CO2: 26 mEq/L (ref 19–32)
Calcium: 8.8 mg/dL (ref 8.4–10.5)
Creat: 0.97 mg/dL (ref 0.50–1.10)
Glucose, Bld: 103 mg/dL — ABNORMAL HIGH (ref 70–99)
Sodium: 142 mEq/L (ref 135–145)

## 2012-07-31 LAB — VITAMIN D 25 HYDROXY (VIT D DEFICIENCY, FRACTURES): Vit D, 25-Hydroxy: 19 ng/mL — ABNORMAL LOW (ref 30–89)

## 2012-07-31 LAB — VITAMIN B12: Vitamin B-12: 412 pg/mL (ref 211–911)

## 2012-07-31 NOTE — Assessment & Plan Note (Signed)
This is a chronic problem, likely contributing to her feeling of fatigue.  Will refill ambien, 5 mg qhs prn.

## 2012-07-31 NOTE — Assessment & Plan Note (Signed)
This is unchanged from March, and at that time TSH, CBC, BMET, BNP were normal.  Will check Vit D and B12, as well as another BMET to see if any of these could be contributing to her symptoms.

## 2012-07-31 NOTE — Assessment & Plan Note (Signed)
Elevated today, but patient has had problems with falling due to hypotension.  Will ask them to bring home readings to next visit with PCP.

## 2012-07-31 NOTE — Progress Notes (Signed)
  Subjective:    Patient ID: Nicole Perez, female    DOB: April 13, 1933, 78 y.o.   MRN: [id number]  HPI  [name] comes in with her daughter today.  She complains of feeling tired all the time, not feeling like doing anything, and not tolerating exercise well.  This has been going on for a while, and she has had labwork done before, and no etiologies found.  She denies changes in her weight, blood in her stool, depressive symptoms.   She is also having difficulty sleeping despite feeling so tired again.  She says she cannot fall asleep, and if she does, she wakes very early in the morning.  Past Medical History  Diagnosis Date  . Essential hypertension, benign 04/18/2010  . HYPERLIPIDEMIA 05/10/2010  . Lichen sclerosus et atrophicus of the vulva   . BACK PAIN, CHRONIC 04/18/2010  . DEGENERATIVE JOINT DISEASE, AXIAL SKELETON 04/21/2010  . DEGENERATIVE JOINT DISEASE, HIPS 09/11/2007  . INSOMNIA, CHRONIC 04/18/2010  . Parotid adenoma 1990, 2012    Right parotid, recurrent parotid pleimorphic adenoma.   Marland Kitchen Spinal stenosis of thoracic region 07/26/2011  . Spinal stenosis of lumbar region 07/26/2011  . Foraminal stenosis of lumbar region 07/26/2011  . Foraminal stenosis of thoracic region 07/26/2011   Family History  Problem Relation Age of Onset  . Coronary artery disease Mother   . Coronary artery disease Sister   . Diabetes type II Sister   . Stroke Father 4  . Lupus Brother    History  Substance Use Topics  . Smoking status: Former Games developer  . Smokeless tobacco: Not on file  . Alcohol Use: Not on file    Review of Systems See HPI    Objective:   Physical Exam  BP 163/85  Pulse 90  Temp 97.6 F (36.4 C) (Oral)  Ht 4\' 11"  (1.499 m)  Wt 174 lb (78.926 kg)  BMI 35.14 kg/m2  SpO2 97% General appearance: alert, cooperative and no distress Neck: no adenopathy, no JVD, supple, symmetrical, trachea midline and thyroid not enlarged, symmetric, no tenderness/mass/nodules Lungs: clear to  auscultation bilaterally Heart: regular rate and rhythm, S1, S2 normal, no murmur, click, rub or gallop Extremities: extremities normal, atraumatic, no cyanosis or edema Pulses: 2+ and symmetric      Assessment & Plan:

## 2012-08-01 ENCOUNTER — Telehealth: Payer: Self-pay | Admitting: Family Medicine

## 2012-08-01 MED ORDER — CHOLECALCIFEROL 1.25 MG (50000 UT) PO TABS: 1.0000 | ORAL_TABLET | ORAL | Status: DC

## 2012-08-01 NOTE — Telephone Encounter (Signed)
Called patient to notify her, Vitamin D was low, will call in replacement dose to her pharmacy.  She needs to take it once a week for 2-3 months, then we will re-check it.  She voices understanding.

## 2012-09-23 ENCOUNTER — Other Ambulatory Visit: Payer: Self-pay | Admitting: Family Medicine

## 2012-09-23 DIAGNOSIS — M4804 Spinal stenosis, thoracic region: Secondary | ICD-10-CM

## 2012-09-23 DIAGNOSIS — M48061 Spinal stenosis, lumbar region without neurogenic claudication: Secondary | ICD-10-CM

## 2012-09-23 DIAGNOSIS — G4700 Insomnia, unspecified: Secondary | ICD-10-CM

## 2012-09-23 MED ORDER — ZOLPIDEM TARTRATE 5 MG PO TABS: 5.0000 mg | ORAL_TABLET | Freq: Every evening | ORAL | Status: DC | PRN

## 2012-09-23 MED ORDER — HYDROCODONE-ACETAMINOPHEN 5-500 MG PO TABS: 1.0000 | ORAL_TABLET | Freq: Two times a day (BID) | ORAL | Status: DC | PRN

## 2012-09-30 ENCOUNTER — Encounter: Payer: Self-pay | Admitting: Family Medicine

## 2012-09-30 ENCOUNTER — Ambulatory Visit (INDEPENDENT_AMBULATORY_CARE_PROVIDER_SITE_OTHER): Payer: Medicare Other | Admitting: Family Medicine

## 2012-09-30 VITALS — BP 120/71 | HR 60 | Temp 97.7°F | Ht 59.0 in | Wt 169.0 lb

## 2012-09-30 DIAGNOSIS — I10 Essential (primary) hypertension: Secondary | ICD-10-CM

## 2012-09-30 DIAGNOSIS — E559 Vitamin D deficiency, unspecified: Secondary | ICD-10-CM

## 2012-09-30 DIAGNOSIS — M25519 Pain in unspecified shoulder: Secondary | ICD-10-CM

## 2012-09-30 DIAGNOSIS — M25511 Pain in right shoulder: Secondary | ICD-10-CM | POA: Insufficient documentation

## 2012-09-30 DIAGNOSIS — Z23 Encounter for immunization: Secondary | ICD-10-CM

## 2012-09-30 HISTORY — DX: Vitamin D deficiency, unspecified: E55.9

## 2012-09-30 NOTE — Patient Instructions (Addendum)
Nicole Perez,  Thank you for coming in today.   For your R shoulder pain: 1. Continue aleve, limit course to 2 weeks.  2. Apply heat with heating pad, hot bath, shower.  3. Continue vicodin q AM.  4. May take up to 2000 mg of tylenol daily during a arthritis flare, one vicodin plus 3 500 mg tabs throughout the day.   F/u as needed for persistent pain that may benefit from a steroid injection into the shoulder.   Dr. Armen Pickup

## 2012-09-30 NOTE — Progress Notes (Signed)
Subjective:     Patient ID: Nicole Perez, female   DOB: 1933-08-29, 78 y.o.   MRN: [id number]  HPI 78 yo F present accompanied by her daughter to discuss the following:  1. HTN: compliant with medications. Checks BP at home. Always wnl. Admits to SOB that resolved with weight loss and foot swelling. Denies CP.   2. R shoulder pain: x 1 week. Pain associated with movement. Pain slowly improving with activity, raked the lawn yesterday. Taking daily vicodin and aleve once daily. Denies new injury.   3. Vitamin D def: completed oral vitamin D. No refills.   4. Request flu shot.   Review of Systems As per HPI    Objective:   Physical Exam BP 120/71  Pulse 60  Temp 97.7 F (36.5 C) (Oral)  Ht 4\' 11"  (1.499 m)  Wt 169 lb (76.658 kg)  BMI 34.13 kg/m2 General appearance: alert, cooperative and no distress Lungs: clear to auscultation bilaterally Heart: regular rate and rhythm, S1, S2 normal, no murmur, click, rub or gallop Extremities: edema trace pitting edema 3 cm above ankle bilaterally.  Shoulder: Inspection reveals no abnormalities, atrophy or asymmetry. Palpation is normal with no tenderness over AC joint or bicipital groove. ROM is full in all planes. Pain with external rotation and abduction against resistance.  Rotator cuff strength normal throughout. Positive empty can signs  Speeds and Yergason's tests normal. No labral pathology noted with negative Obrien's, negative clunk and good stability. Normal scapular function observed. No painful arc and no drop arm sign. No apprehension sign      Assessment and Plan:

## 2012-09-30 NOTE — Assessment & Plan Note (Addendum)
A: R shoulder pain with impingement signs. Full strength and ROM. No trauma.  P: 1. Continue aleve, limit course to 2 weeks.  2. Apply heat with heating pad, hot bath, shower.  3. Continue vicodin q AM.  4. May take up to 2000 mg of tylenol daily during a arthritis flare, one vicodin plus 3 500 mg tabs throughout the day.   F/u as needed for persistent pain that may benefit from a steroid injection into the shoul

## 2012-09-30 NOTE — Assessment & Plan Note (Signed)
A: compliant with vitamin D. Completed course.  P: Recheck vitamin D level today.  Continue oral vitamin as needed based on level.

## 2012-09-30 NOTE — Assessment & Plan Note (Signed)
A: BP well controlled Meds: compliant and tolerating.  P: continue current regimen.  

## 2012-10-01 ENCOUNTER — Telehealth: Payer: Self-pay | Admitting: Family Medicine

## 2012-10-01 LAB — VITAMIN D 25 HYDROXY (VIT D DEFICIENCY, FRACTURES): Vit D, 25-Hydroxy: 22 ng/mL — ABNORMAL LOW (ref 30–89)

## 2012-10-01 MED ORDER — CHOLECALCIFEROL 1.25 MG (50000 UT) PO TABS: 1.0000 | ORAL_TABLET | ORAL | Status: DC

## 2012-10-01 NOTE — Telephone Encounter (Signed)
Persistently low vit D level. Continue oral supplementation.  Sent in refill for weekly vitamin D x 12 weeks with one refill.

## 2012-10-14 ENCOUNTER — Encounter: Payer: Self-pay | Admitting: Family Medicine

## 2012-10-14 ENCOUNTER — Ambulatory Visit (INDEPENDENT_AMBULATORY_CARE_PROVIDER_SITE_OTHER): Payer: Medicare Other | Admitting: Family Medicine

## 2012-10-14 VITALS — BP 149/92 | HR 73 | Temp 97.9°F | Ht 59.0 in | Wt 169.0 lb

## 2012-10-14 DIAGNOSIS — M25511 Pain in right shoulder: Secondary | ICD-10-CM

## 2012-10-14 DIAGNOSIS — M25519 Pain in unspecified shoulder: Secondary | ICD-10-CM

## 2012-10-14 NOTE — Patient Instructions (Addendum)
Nicole Perez,  I believe you have subacromial bursitis. For this, I have injected steroid and lidocaine into your shoulder. Please ice your shoulder and take it easy this afternoon. Tomorrow, start range of motion exercise.   If pain persist or does not improve, will get  X-ray and refer to sports medicine.  Dr. Armen Pickup   Impingement Syndrome, Rotator Cuff, Bursitis with Rehab Impingement syndrome is a condition that involves inflammation of the tendons of the rotator cuff and the subacromial bursa, that causes pain in the shoulder. The rotator cuff consists of four tendons and muscles that control much of the shoulder and upper arm function. The subacromial bursa is a fluid filled sac that helps reduce friction between the rotator cuff and one of the bones of the shoulder (acromion). Impingement syndrome is usually an overuse injury that causes swelling of the bursa (bursitis), swelling of the tendon (tendonitis), and/or a tear of the tendon (strain). Strains are classified into three categories. Grade 1 strains cause pain, but the tendon is not lengthened. Grade 2 strains include a lengthened ligament, due to the ligament being stretched or partially ruptured. With grade 2 strains there is still function, although the function may be decreased. Grade 3 strains include a complete tear of the tendon or muscle, and function is usually impaired. SYMPTOMS   Pain around the shoulder, often at the outer portion of the upper arm.  Pain that gets worse with shoulder function, especially when reaching overhead or lifting.  Sometimes, aching when not using the arm.  Pain that wakes you up at night.  Sometimes, tenderness, swelling, warmth, or redness over the affected area.  Loss of strength.  Limited motion of the shoulder, especially reaching behind the back (to the back pocket or to unhook bra) or across your body.  Crackling sound (crepitation) when moving the arm.  Biceps tendon pain and  inflammation (in the front of the shoulder). Worse when bending the elbow or lifting. CAUSES  Impingement syndrome is often an overuse injury, in which chronic (repetitive) motions cause the tendons or bursa to become inflamed. A strain occurs when a force is paced on the tendon or muscle that is greater than it can withstand. Common mechanisms of injury include: Stress from sudden increase in duration, frequency, or intensity of training.  Direct hit (trauma) to the shoulder.  Aging, erosion of the tendon with normal use.  Bony bump on shoulder (acromial spur). RISK INCREASES WITH:  Contact sports (football, wrestling, boxing).  Throwing sports (baseball, tennis, volleyball).  Weightlifting and bodybuilding.  Heavy labor.  Previous injury to the rotator cuff, including impingement.  Poor shoulder strength and flexibility.  Failure to warm up properly before activity.  Inadequate protective equipment.  Old age.  Bony bump on shoulder (acromial spur). PREVENTION   Warm up and stretch properly before activity.  Allow for adequate recovery between workouts.  Maintain physical fitness:  Strength, flexibility, and endurance.  Cardiovascular fitness.  Learn and use proper exercise technique. PROGNOSIS  If treated properly, impingement syndrome usually goes away within 6 weeks. Sometimes surgery is required.  RELATED COMPLICATIONS   Longer healing time if not properly treated, or if not given enough time to heal.  Recurring symptoms, that result in a chronic condition.  Shoulder stiffness, frozen shoulder, or loss of motion.  Rotator cuff tendon tear.  Recurring symptoms, especially if activity is resumed too soon, with overuse, with a direct blow, or when using poor technique. TREATMENT  Treatment first involves the  use of ice and medicine, to reduce pain and inflammation. The use of strengthening and stretching exercises may help reduce pain with activity. These  exercises may be performed at home or with a therapist. If non-surgical treatment is unsuccessful after more than 6 months, surgery may be advised. After surgery and rehabilitation, activity is usually possible in 3 months.  MEDICATION  If pain medicine is needed, nonsteroidal anti-inflammatory medicines (aspirin and ibuprofen), or other minor pain relievers (acetaminophen), are often advised.  Do not take pain medicine for 7 days before surgery.  Prescription pain relievers may be given, if your caregiver thinks they are needed. Use only as directed and only as much as you need.  Corticosteroid injections may be given by your caregiver. These injections should be reserved for the most serious cases, because they may only be given a certain number of times. HEAT AND COLD  Cold treatment (icing) should be applied for 10 to 15 minutes every 2 to 3 hours for inflammation and pain, and immediately after activity that aggravates your symptoms. Use ice packs or an ice massage.  Heat treatment may be used before performing stretching and strengthening activities prescribed by your caregiver, physical therapist, or athletic trainer. Use a heat pack or a warm water soak. SEEK MEDICAL CARE IF:   Symptoms get worse or do not improve in 4 to 6 weeks, despite treatment.  New, unexplained symptoms develop. (Drugs used in treatment may produce side effects.) EXERCISES  RANGE OF MOTION (ROM) AND STRETCHING EXERCISES - Impingement Syndrome (Rotator Cuff  Tendinitis, Bursitis) These exercises may help you when beginning to rehabilitate your injury. Your symptoms may go away with or without further involvement from your physician, physical therapist or athletic trainer. While completing these exercises, remember:   Restoring tissue flexibility helps normal motion to return to the joints. This allows healthier, less painful movement and activity.  An effective stretch should be held for at least 30  seconds.  A stretch should never be painful. You should only feel a gentle lengthening or release in the stretched tissue. STRETCH  Flexion, Standing  Stand with good posture. With an underhand grip on your right / left hand, and an overhand grip on the opposite hand, grasp a broomstick or cane so that your hands are a little more than shoulder width apart.  Keeping your right / left elbow straight and shoulder muscles relaxed, push the stick with your opposite hand, to raise your right / left arm in front of your body and then overhead. Raise your arm until you feel a stretch in your right / left shoulder, but before you have increased shoulder pain.  Try to avoid shrugging your right / left shoulder as your arm rises, by keeping your shoulder blade tucked down and toward your mid-back spine. Hold for __________ seconds.  Slowly return to the starting position. Repeat __________ times. Complete this exercise __________ times per day. STRETCH  Abduction, Supine  Lie on your back. With an underhand grip on your right / left hand and an overhand grip on the opposite hand, grasp a broomstick or cane so that your hands are a little more than shoulder width apart.  Keeping your right / left elbow straight and your shoulder muscles relaxed, push the stick with your opposite hand, to raise your right / left arm out to the side of your body and then overhead. Raise your arm until you feel a stretch in your right / left shoulder, but before you  have increased shoulder pain.  Try to avoid shrugging your right / left shoulder as your arm rises, by keeping your shoulder blade tucked down and toward your mid-back spine. Hold for __________ seconds.  Slowly return to the starting position. Repeat __________ times. Complete this exercise __________ times per day. ROM  Flexion, Active-Assisted  Lie on your back. You may bend your knees for comfort.  Grasp a broomstick or cane so your hands are about  shoulder width apart. Your right / left hand should grip the end of the stick, so that your hand is positioned "thumbs-up," as if you were about to shake hands.  Using your healthy arm to lead, raise your right / left arm overhead, until you feel a gentle stretch in your shoulder. Hold for __________ seconds.  Use the stick to assist in returning your right / left arm to its starting position. Repeat __________ times. Complete this exercise __________ times per day.  ROM - Internal Rotation, Supine   Lie on your back on a firm surface. Place your right / left elbow about 60 degrees away from your side. Elevate your elbow with a folded towel, so that the elbow and shoulder are the same height.  Using a broomstick or cane and your strong arm, pull your right / left hand toward your body until you feel a gentle stretch, but no increase in your shoulder pain. Keep your shoulder and elbow in place throughout the exercise.  Hold for __________ seconds. Slowly return to the starting position. Repeat __________ times. Complete this exercise __________ times per day. STRETCH - Internal Rotation  Place your right / left hand behind your back, palm up.  Throw a towel or belt over your opposite shoulder. Grasp the towel with your right / left hand.  While keeping an upright posture, gently pull up on the towel, until you feel a stretch in the front of your right / left shoulder.  Avoid shrugging your right / left shoulder as your arm rises, by keeping your shoulder blade tucked down and toward your mid-back spine.  Hold for __________ seconds. Release the stretch, by lowering your healthy hand. Repeat __________ times. Complete this exercise __________ times per day. ROM - Internal Rotation   Using an underhand grip, grasp a stick behind your back with both hands.  While standing upright with good posture, slide the stick up your back until you feel a mild stretch in the front of your  shoulder.  Hold for __________ seconds. Slowly return to your starting position. Repeat __________ times. Complete this exercise __________ times per day.  STRETCH  Posterior Shoulder Capsule   Stand or sit with good posture. Grasp your right / left elbow and draw it across your chest, keeping it at the same height as your shoulder.  Pull your elbow, so your upper arm comes in closer to your chest. Pull until you feel a gentle stretch in the back of your shoulder.  Hold for __________ seconds. Repeat __________ times. Complete this exercise __________ times per day. STRENGTHENING EXERCISES - Impingement Syndrome (Rotator Cuff Tendinitis, Bursitis) These exercises may help you when beginning to rehabilitate your injury. They may resolve your symptoms with or without further involvement from your physician, physical therapist or athletic trainer. While completing these exercises, remember:  Muscles can gain both the endurance and the strength needed for everyday activities through controlled exercises.  Complete these exercises as instructed by your physician, physical therapist or athletic trainer. Increase the resistance  and repetitions only as guided.  You may experience muscle soreness or fatigue, but the pain or discomfort you are trying to eliminate should never worsen during these exercises. If this pain does get worse, stop and make sure you are following the directions exactly. If the pain is still present after adjustments, discontinue the exercise until you can discuss the trouble with your clinician.  During your recovery, avoid activity or exercises which involve actions that place your injured hand or elbow above your head or behind your back or head. These positions stress the tissues which you are trying to heal. STRENGTH - Scapular Depression and Adduction   With good posture, sit on a firm chair. Support your arms in front of you, with pillows, arm rests, or on a table top.  Have your elbows in line with the sides of your body.  Gently draw your shoulder blades down and toward your mid-back spine. Gradually increase the tension, without tensing the muscles along the top of your shoulders and the back of your neck.  Hold for __________ seconds. Slowly release the tension and relax your muscles completely before starting the next repetition.  After you have practiced this exercise, remove the arm support and complete the exercise in standing as well as sitting position. Repeat __________ times. Complete this exercise __________ times per day.  STRENGTH - Shoulder Abductors, Isometric  With good posture, stand or sit about 4-6 inches from a wall, with your right / left side facing the wall.  Bend your right / left elbow. Gently press your right / left elbow into the wall. Increase the pressure gradually, until you are pressing as hard as you can, without shrugging your shoulder or increasing any shoulder discomfort.  Hold for __________ seconds.  Release the tension slowly. Relax your shoulder muscles completely before you begin the next repetition. Repeat __________ times. Complete this exercise __________ times per day.  STRENGTH - External Rotators, Isometric  Keep your right / left elbow at your side and bend it 90 degrees.  Step into a door frame so that the outside of your right / left wrist can press against the door frame without your upper arm leaving your side.  Gently press your right / left wrist into the door frame, as if you were trying to swing the back of your hand away from your stomach. Gradually increase the tension, until you are pressing as hard as you can, without shrugging your shoulder or increasing any shoulder discomfort.  Hold for __________ seconds.  Release the tension slowly. Relax your shoulder muscles completely before you begin the next repetition. Repeat __________ times. Complete this exercise __________ times per day.   STRENGTH - Supraspinatus   Stand or sit with good posture. Grasp a __________ weight, or an exercise band or tubing, so that your hand is "thumbs-up," like you are shaking hands.  Slowly lift your right / left arm in a "V" away from your thigh, diagonally into the space between your side and straight ahead. Lift your hand to shoulder height or as far as you can, without increasing any shoulder pain. At first, many people do not lift their hands above shoulder height.  Avoid shrugging your right / left shoulder as your arm rises, by keeping your shoulder blade tucked down and toward your mid-back spine.  Hold for __________ seconds. Control the descent of your hand, as you slowly return to your starting position. Repeat __________ times. Complete this exercise __________ times per day.  STRENGTH - External Rotators  Secure a rubber exercise band or tubing to a fixed object (table, pole) so that it is at the same height as your right / left elbow when you are standing or sitting on a firm surface.  Stand or sit so that the secured exercise band is at your uninjured side.  Bend your right / left elbow 90 degrees. Place a folded towel or small pillow under your right / left arm, so that your elbow is a few inches away from your side.  Keeping the tension on the exercise band, pull it away from your body, as if pivoting on your elbow. Be sure to keep your body steady, so that the movement is coming only from your rotating shoulder.  Hold for __________ seconds. Release the tension in a controlled manner, as you return to the starting position. Repeat __________ times. Complete this exercise __________ times per day.  STRENGTH - Internal Rotators   Secure a rubber exercise band or tubing to a fixed object (table, pole) so that it is at the same height as your right / left elbow when you are standing or sitting on a firm surface.  Stand or sit so that the secured exercise band is at your right /  left side.  Bend your elbow 90 degrees. Place a folded towel or small pillow under your right / left arm so that your elbow is a few inches away from your side.  Keeping the tension on the exercise band, pull it across your body, toward your stomach. Be sure to keep your body steady, so that the movement is coming only from your rotating shoulder.  Hold for __________ seconds. Release the tension in a controlled manner, as you return to the starting position. Repeat __________ times. Complete this exercise __________ times per day.  STRENGTH - Scapular Protractors, Standing   Stand arms length away from a wall. Place your hands on the wall, keeping your elbows straight.  Begin by dropping your shoulder blades down and toward your mid-back spine.  To strengthen your protractors, keep your shoulder blades down, but slide them forward on your rib cage. It will feel as if you are lifting the back of your rib cage away from the wall. This is a subtle motion and can be challenging to complete. Ask your caregiver for further instruction, if you are not sure you are doing the exercise correctly.  Hold for __________ seconds. Slowly return to the starting position, resting the muscles completely before starting the next repetition. Repeat __________ times. Complete this exercise __________ times per day. STRENGTH - Scapular Protractors, Supine  Lie on your back on a firm surface. Extend your right / left arm straight into the air while holding a __________ weight in your hand.  Keeping your head and back in place, lift your shoulder off the floor.  Hold for __________ seconds. Slowly return to the starting position, and allow your muscles to relax completely before starting the next repetition. Repeat __________ times. Complete this exercise __________ times per day. STRENGTH - Scapular Protractors, Quadruped  Get onto your hands and knees, with your shoulders directly over your hands (or as close  as you can be, comfortably).  Keeping your elbows locked, lift the back of your rib cage up into your shoulder blades, so your mid-back rounds out. Keep your neck muscles relaxed.  Hold this position for __________ seconds. Slowly return to the starting position and allow your muscles to relax completely  before starting the next repetition. Repeat __________ times. Complete this exercise __________ times per day.  STRENGTH - Scapular Retractors  Secure a rubber exercise band or tubing to a fixed object (table, pole), so that it is at the height of your shoulders when you are either standing, or sitting on a firm armless chair.  With a palm down grip, grasp an end of the band in each hand. Straighten your elbows and lift your hands straight in front of you, at shoulder height. Step back, away from the secured end of the band, until it becomes tense.  Squeezing your shoulder blades together, draw your elbows back toward your sides, as you bend them. Keep your upper arms lifted away from your body throughout the exercise.  Hold for __________ seconds. Slowly ease the tension on the band, as you reverse the directions and return to the starting position. Repeat __________ times. Complete this exercise __________ times per day. STRENGTH - Shoulder Extensors   Secure a rubber exercise band or tubing to a fixed object (table, pole) so that it is at the height of your shoulders when you are either standing, or sitting on a firm armless chair.  With a thumbs-up grip, grasp an end of the band in each hand. Straighten your elbows and lift your hands straight in front of you, at shoulder height. Step back, away from the secured end of the band, until it becomes tense.  Squeezing your shoulder blades together, pull your hands down to the sides of your thighs. Do not allow your hands to go behind you.  Hold for __________ seconds. Slowly ease the tension on the band, as you reverse the directions and  return to the starting position. Repeat __________ times. Complete this exercise __________ times per day.  STRENGTH - Scapular Retractors and External Rotators   Secure a rubber exercise band or tubing to a fixed object (table, pole) so that it is at the height as your shoulders, when you are either standing, or sitting on a firm armless chair.  With a palm down grip, grasp an end of the band in each hand. Bend your elbows 90 degrees and lift your elbows to shoulder height, at your sides. Step back, away from the secured end of the band, until it becomes tense.  Squeezing your shoulder blades together, rotate your shoulders so that your upper arms and elbows remain stationary, but your fists travel upward to head height.  Hold for __________ seconds. Slowly ease the tension on the band, as you reverse the directions and return to the starting position. Repeat __________ times. Complete this exercise __________ times per day.  STRENGTH - Scapular Retractors and External Rotators, Rowing   Secure a rubber exercise band or tubing to a fixed object (table, pole) so that it is at the height of your shoulders, when you are either standing, or sitting on a firm armless chair.  With a palm down grip, grasp an end of the band in each hand. Straighten your elbows and lift your hands straight in front of you, at shoulder height. Step back, away from the secured end of the band, until it becomes tense.  Step 1: Squeeze your shoulder blades together. Bending your elbows, draw your hands to your chest, as if you are rowing a boat. At the end of this motion, your hands and elbow should be at shoulder height and your elbows should be out to your sides.  Step 2: Rotate your shoulders, to raise your  hands above your head. Your forearms should be vertical and your upper arms should be horizontal.  Hold for __________ seconds. Slowly ease the tension on the band, as you reverse the directions and return to the  starting position. Repeat __________ times. Complete this exercise __________ times per day.  STRENGTH  Scapular Depressors  Find a sturdy chair without wheels, such as a dining room chair.  Keeping your feet on the floor, and your hands on the chair arms, lift your bottom up from the seat, and lock your elbows.  Keeping your elbows straight, allow gravity to pull your body weight down. Your shoulders will rise toward your ears.  Raise your body against gravity by drawing your shoulder blades down your back, shortening the distance between your shoulders and ears. Although your feet should always maintain contact with the floor, your feet should progressively support less body weight, as you get stronger.  Hold for __________ seconds. In a controlled and slow manner, lower your body weight to begin the next repetition. Repeat __________ times. Complete this exercise __________ times per day.  Document Released: 12/03/2005 Document Revised: 02/25/2012 Document Reviewed: 03/17/2009 [facility] Patient Information 2013 [place], [place].

## 2012-10-16 NOTE — Progress Notes (Signed)
Subjective:     Patient ID: Nicole Perez, female   DOB: 08-01-1933, 78 y.o.   MRN: [id number]  HPI 78 yo F presents to f/u R shoulder pain. That started around 09/22/12. Pain associated with movement. Pain was slowly improving with activity, but is now worse. It is 9/10. Sharp and achy.  Posterior shoulder pain radiating laterally. Worse with abduction and internal rotation, l;imiting range of motion. She continues to deny injury to her shoulder. She has tried home vicodin and aleve without relief. She denies fever, chills, weight loss, night sweats. She is due for screening mammogram and has it schedule for this month.   Review of Systems As per HPI     Objective:   Physical Exam BP 149/92  Pulse 73  Temp 97.9 F (36.6 C) (Oral)  Ht 4\' 11"  (1.499 m)  Wt 169 lb (76.658 kg)  BMI 34.13 kg/m2 General appearance: alert, cooperative and no distress/mild distress when R shoulder manipulated.  Lymph nodes: Axillary adenopathy: negative.  Shoulder: Inspection reveals no abnormalities, atrophy or asymmetry. Palpation is normal with no tenderness over AC joint or bicipital groove. ROM is limited in abduction and internal rotation due to pain.  Rotator cuff strength normal throughout. Signs of impingement with positive empty can. No labral pathology noted with negative Obrien's, negative clunk and good stability. Normal scapular function observed. No painful arc and no drop arm sign.  After obtaining informed consent a  steroid injection was performed at R shoulder using anterior/lateral approach into the subacromial space using 3 cc of 2% plain Lidocaine and 1 cc of  40 mg/43ml of methylprednisolone. This was well tolerated.     Assessment and Plan:

## 2012-10-16 NOTE — Assessment & Plan Note (Signed)
A: I believe you have subacromial bursitis. P: For this, I have injected steroid and lidocaine into your shoulder. Please ice your shoulder and take it easy this afternoon. Tomorrow, start range of motion exercise.   If pain persist or does not improve, will get  X-ray and refer to sports medicine.

## 2012-10-29 ENCOUNTER — Telehealth: Payer: Self-pay | Admitting: Family Medicine

## 2012-10-29 DIAGNOSIS — M25511 Pain in right shoulder: Secondary | ICD-10-CM

## 2012-10-29 NOTE — Telephone Encounter (Signed)
Forward to Dr. Armen Pickup, who performed injection.  She has PM clinic on 10/30/12.  She mentioned in her last note an Xray or [facility] referral Fle[place], [name]

## 2012-10-29 NOTE — Telephone Encounter (Signed)
Pt is calling because she cannot lift up her right arm. Seems like its getting worse even after getting the injection and would like to know what she can do. She has to get her granddaughter off of the bus at 330 so she won't be in the house to answer the phone.Nicole Perez [place]

## 2012-10-30 ENCOUNTER — Ambulatory Visit: Payer: Medicare Other | Admitting: Family Medicine

## 2012-10-30 ENCOUNTER — Ambulatory Visit (HOSPITAL_COMMUNITY)
Admission: RE | Admit: 2012-10-30 | Discharge: 2012-10-30 | Disposition: A | Discharge status: Home / Self Care | Payer: Medicare Other | Source: Ambulatory Visit | Attending: Family Medicine | Admitting: Family Medicine

## 2012-10-30 DIAGNOSIS — M25511 Pain in right shoulder: Secondary | ICD-10-CM

## 2012-10-30 DIAGNOSIS — M25519 Pain in unspecified shoulder: Secondary | ICD-10-CM | POA: Insufficient documentation

## 2012-10-30 NOTE — Telephone Encounter (Signed)
Called patient back. She reports R shoulder pain with movement and inability to move her R shoulder actively. She did well post injection until  day 3 when she vacuumed. She denies pain at rest.  Plan: Complete shoulder x-ray. [facility].  Evaluation: sports medicine. There is availability this afternoon but patient prefers next Monday or Tuesday due to transportation (daughters or off from work).   Called [facility]. Patient can be seen today at [facility] office: Monday November 03, 2012 at 11:30 AM Dr. Herbert Moors.  [address].  Phone # [phone].   Called patient back and informed of her appointment information. She was able to read back details. I advised her to arrive 15 mins early. She has no questions.

## 2012-10-31 ENCOUNTER — Telehealth: Payer: Self-pay | Admitting: Family Medicine

## 2012-10-31 NOTE — Telephone Encounter (Signed)
Message copied by Dessa Phi on Fri Oct 31, 2012  2:18 PM ------      Message from: CHAMBLISS, MARSHALL L      Created: Thu Oct 30, 2012  4:02 PM                   ----- Message -----         From: Rad Results In Interface         Sent: 10/30/2012   3:04 PM           To: Carney Living, MD

## 2012-10-31 NOTE — Telephone Encounter (Signed)
Called patient left VM. She has the following degenerative changes on x-ray:  Right glenohumeral joint degenerative changes with osteophyteformation along the inferior aspect of the glenoid.  Acromioclavicular joint degenerative changes with bony overgrowth.  Advised the she keep f/u appt with ortho.

## 2012-11-03 ENCOUNTER — Ambulatory Visit (INDEPENDENT_AMBULATORY_CARE_PROVIDER_SITE_OTHER): Payer: Medicare Other | Admitting: Family Medicine

## 2012-11-03 VITALS — BP 165/96 | Ht 61.0 in | Wt 169.0 lb

## 2012-11-03 DIAGNOSIS — M25519 Pain in unspecified shoulder: Secondary | ICD-10-CM

## 2012-11-03 DIAGNOSIS — M25511 Pain in right shoulder: Secondary | ICD-10-CM

## 2012-11-03 NOTE — Patient Instructions (Addendum)
You have overused your rotator cuff though a flare of shoulder arthritis would also cause these symptoms. Try to avoid painful activities (overhead activities, lifting with extended arm) as much as possible. Ibuprofen as you have been. Can take your hydrocodone in addition to this. Combination shoulder and subacromial injection may be beneficial to help with pain and to decrease inflammation - this was given today. Call me in 2 weeks to let me know how you're doing. If not improving we will consider further imaging and/or physical therapy.

## 2012-11-04 ENCOUNTER — Encounter: Payer: Self-pay | Admitting: Family Medicine

## 2012-11-04 NOTE — Assessment & Plan Note (Signed)
radiographs show glenohumeral DJD.  Her exam and history suggest pain due to rotator cuff syndrome (bursitis, tendinopathy) though some of symptoms may be due to arthritis.  These can predispose patient to adhesive capsulitis.  We discussed her options.  Decided to go ahead with repeat subacromial but with glenohumeral injection as well today.  Discussed icing, ibuprofen.  Avoid overhead and reaching activities - was improving until she overdid it again at home.  Consider formal PT, imaging if not improving over next 2 weeks or if only mild improvement.  Otherwise can go ahead with home strengthening exercises.  After informed written consent, patient was seated on exam table. Right shoulder was prepped with alcohol swab and utilizing posterior approach, patient's right shoulder was injected with 6:2 marcaine:depomedrol with half in the subacromial space and half in glenohumeral space.  Patient tolerated the procedure well without immediate complications.

## 2012-11-04 NOTE — Progress Notes (Signed)
Subjective:    Patient ID: Nicole Perez, female    DOB: March 13, 1933, 78 y.o.   MRN: [id number]  PCP: Dr. McDiarmid  HPI 78 yo F here for right shoulder pain.  Patient denies known injury. She states pain started after raking leaves in her yard over a month ago. No acute injury during this but pain started that evening and worsened by the next day. Tried ibuprofen, heat and cold. Eventually seen at PCP's office - given subacromial injection for bursitis/RTC tendinitis which helped but then she did a lot of vacuuming a few days after this and pain recurred again. Difficulty moving shoulder since that time. Takes a pain pill for arthritis. No numbness/tingling. No neck pain.  Past Medical History  Diagnosis Date  . Essential hypertension, benign 04/18/2010  . HYPERLIPIDEMIA 05/10/2010  . Lichen sclerosus et atrophicus of the vulva   . BACK PAIN, CHRONIC 04/18/2010  . DEGENERATIVE JOINT DISEASE, AXIAL SKELETON 04/21/2010  . DEGENERATIVE JOINT DISEASE, HIPS 09/11/2007  . INSOMNIA, CHRONIC 04/18/2010  . Parotid adenoma 1990, 2012    Right parotid, recurrent parotid pleimorphic adenoma.   Marland Kitchen Spinal stenosis of thoracic region 07/26/2011  . Spinal stenosis of lumbar region 07/26/2011  . Foraminal stenosis of lumbar region 07/26/2011  . Foraminal stenosis of thoracic region 07/26/2011    Current Outpatient Prescriptions on File Prior to Visit  Medication Sig Dispense Refill  . Cholecalciferol 50000 UNITS TABS Take 1 tablet by mouth once a week.  12 tablet  1  . diltiazem (DILACOR XR) 240 MG 24 hr capsule Take 1 capsule (240 mg total) by mouth daily.  30 capsule  PRN  . HYDROcodone-acetaminophen (VICODIN) 5-500 MG per tablet Take 1 tablet by mouth 2 (two) times daily as needed for pain. Fill on or after 02/23/12.  60 tablet  3  . magnesium hydroxide (MILK OF MAGNESIA) 400 MG/5ML suspension Take by mouth as needed.        . zolpidem (AMBIEN) 5 MG tablet Take 1 tablet (5 mg total) by mouth at bedtime as  needed for sleep.  30 tablet  3    Past Surgical History  Procedure Date  . Total abdominal hysterectomy w/ bilateral salpingoophorectomy 1964    Hysterectomy and bilateral oopherectomy at age 33 for benign reasons  . Bladder suspension     Bladder tack x 2 (Dr Brunilda Payor)  . Cystocele repair     Rectal prolapse and cyctocele adter hysterectomy requiring anterior repair Judie Petit Leda Quail, MD)  . Rectocele repair     Rectal prolapse and cyctocele adter hysterectomy requiring anterior repair Judie Petit Leda Quail, MD)  . Urethral dilation   . Parotid gland tumor excision 1990    S/P excision of Right Parotid Gland Benign Tumor, 1990  . Reconstruction of nose 1994    Nasal bridge reconstruction (Dr Micki Riley, 1994) for following  Forklift accident on job. Surgery complicated by nerve damage resulting in difficulty raising right eyebrow   . Carpal tunnel release 2010    Carpel Tunnel Release of  left wrist  03/2009 (Dr Merlyn Lot): Nerve   . Cataract extraction w/ intraocular lens implant   . Breast lumpectomy     Lumpectomy of benign Breast lumps bilaterally, Dr Lurene Shadow  . Laminectomy     S/P L4-5 Laminectomy (1987) for decompression of spinal stenosis  . Colonoscopy w/ polypectomy 2006  . Parotidectomy 08/30/11    Narda Bonds, MD (ENT) for recurrent right parotid pleomorphic adenoma by frozen section    No  Known Allergies  History   Social History  . Marital Status: Widowed    Spouse Name: N/A    Number of Children: N/A  . Years of Education: N/A   Occupational History  . retired    Social History Main Topics  . Smoking status: Former Games developer  . Smokeless tobacco: Not on file  . Alcohol Use: Not on file  . Drug Use: Not on file  . Sexually Active: Not on file   Other Topics Concern  . 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     Family History  Problem Relation Age of Onset  . Coronary artery disease Mother   . Coronary artery disease Sister   . Diabetes type II Sister   . Stroke Father 48  . Hypertension Father   . Lupus Brother     BP 165/96  Ht 5\' 1"  (1.549 m)  Wt 169 lb (76.658 kg)  BMI 31.93 kg/m2  Review of Systems See HPI above.    Objective:   Physical Exam Gen: NAD  R shoulder: No swelling, ecchymoses.  No gross deformity. No TTP AC joint.  Tenderness over posterior and anterior capsule. Abduction and flexion to 100 degrees, painful arc.  50 degrees ER compared to 80 on left. Positive Hawkins, Neers. Negative Speeds, Yergasons. Strength 5-/5 with empty can and 5/5 resisted internal/external rotation.  Pain with empty can. NV intact distally.    Assessment & Plan:  1. Right shoulder pain - radiographs show glenohumeral DJD.  Her exam and history suggest pain due to rotator cuff syndrome (bursitis, tendinopathy) though some of symptoms may be due to arthritis.  These can predispose patient to adhesive capsulitis.  We discussed her options.  Decided to go ahead with repeat subacromial but with glenohumeral injection as well today.  Discussed icing, ibuprofen.  Avoid overhead and reaching activities - was improving until she overdid it again at home.  Consider formal PT, imaging if not improving over next 2 weeks or if only mild improvement.  Otherwise can go ahead with home strengthening exercises.  After informed written consent, patient was seated on exam table. Right shoulder was prepped with alcohol swab and utilizing posterior approach, patient's right shoulder was injected with 6:2 marcaine:depomedrol with half in the subacromial space and half in glenohumeral space.  Patient tolerated the  procedure well without immediate complications.

## 2012-11-17 ENCOUNTER — Other Ambulatory Visit: Payer: Self-pay | Admitting: Family Medicine

## 2012-11-17 DIAGNOSIS — Z1231 Encounter for screening mammogram for malignant neoplasm of breast: Secondary | ICD-10-CM

## 2012-12-04 ENCOUNTER — Ambulatory Visit (HOSPITAL_COMMUNITY)
Admission: RE | Admit: 2012-12-04 | Discharge: 2012-12-04 | Disposition: A | Discharge status: Home / Self Care | Payer: Medicare Other | Source: Ambulatory Visit | Attending: Family Medicine | Admitting: Family Medicine

## 2012-12-04 DIAGNOSIS — Z1231 Encounter for screening mammogram for malignant neoplasm of breast: Secondary | ICD-10-CM | POA: Insufficient documentation

## 2012-12-17 DIAGNOSIS — M21371 Foot drop, right foot: Secondary | ICD-10-CM | POA: Insufficient documentation

## 2012-12-26 ENCOUNTER — Other Ambulatory Visit: Payer: Self-pay | Admitting: Family Medicine

## 2012-12-26 DIAGNOSIS — M4804 Spinal stenosis, thoracic region: Secondary | ICD-10-CM

## 2012-12-26 DIAGNOSIS — M48061 Spinal stenosis, lumbar region without neurogenic claudication: Secondary | ICD-10-CM

## 2012-12-26 MED ORDER — HYDROCODONE-ACETAMINOPHEN 5-325 MG PO TABS: 1.0000 | ORAL_TABLET | Freq: Four times a day (QID) | ORAL | Status: DC | PRN

## 2013-01-08 ENCOUNTER — Telehealth: Payer: Self-pay | Admitting: Family Medicine

## 2013-01-08 NOTE — Telephone Encounter (Signed)
States that the hydrocodone is making her sick - she needs something different

## 2013-01-09 NOTE — Telephone Encounter (Signed)
Patient claims that the Vicodin she was recently prescribed is a different pill that she has taken before.  She has been breaking it into small pieces when she takes it.  This pill is making her sick to her stomach.  Also claims that she just does not feel well.  Has not taken the med since yesterday and nausea is better but still having pain.  Discussed with Dr. McDiarmid.  He will call her.

## 2013-01-09 NOTE — Telephone Encounter (Signed)
I spoke with Ms Maultsby by phone. She reports non-vertiginous, dizziness soon after she takes a whole tablet of vicodin.  "Feel staggery". She does not feel it if she takes a half a pill, but then the pain from her waist down is not controlled.  She is having significant pain first thing in the morning "from my waist down."  She will try taking the Vicodin with meals to see if the dizziness improves.  She is to see Dr Mabrey Howland on 01/19/13.

## 2013-01-19 ENCOUNTER — Encounter: Payer: Self-pay | Admitting: Family Medicine

## 2013-01-19 ENCOUNTER — Ambulatory Visit (INDEPENDENT_AMBULATORY_CARE_PROVIDER_SITE_OTHER): Payer: Medicare Other | Admitting: Family Medicine

## 2013-01-19 VITALS — BP 158/82 | HR 81 | Ht 59.0 in | Wt 167.0 lb

## 2013-01-19 DIAGNOSIS — M549 Dorsalgia, unspecified: Secondary | ICD-10-CM

## 2013-01-19 DIAGNOSIS — E559 Vitamin D deficiency, unspecified: Secondary | ICD-10-CM

## 2013-01-19 DIAGNOSIS — M25511 Pain in right shoulder: Secondary | ICD-10-CM

## 2013-01-19 DIAGNOSIS — M25519 Pain in unspecified shoulder: Secondary | ICD-10-CM

## 2013-01-19 DIAGNOSIS — I10 Essential (primary) hypertension: Secondary | ICD-10-CM

## 2013-01-19 DIAGNOSIS — G4700 Insomnia, unspecified: Secondary | ICD-10-CM

## 2013-01-19 NOTE — Patient Instructions (Signed)
You may take one Aleve tablet twice a day if you needed it for back and shoulder pain.  Your blood pressure is under good control .  Keep taking your Diltazem once a day.  Start taking on Vitamin D (50,000 Unit) tablet once a month on the first of the month

## 2013-01-20 ENCOUNTER — Encounter: Payer: Self-pay | Admitting: Family Medicine

## 2013-01-20 MED ORDER — CHOLECALCIFEROL 1.25 MG (50000 UT) PO TABS: 1.0000 | ORAL_TABLET | ORAL | Status: DC

## 2013-01-20 NOTE — Progress Notes (Signed)
  Subjective:    Patient ID: Nicole Perez, female    DOB: 1933-02-06, 79 y.o.   MRN: [id number]  HPI HYPERTENSION Disease Monitoring: Blood pressure range-not checking at home  Chest pain, palpitations- none      Dyspnea- none Medications: Compliance- taking Diltizxem daily   Lightheadedness,Syncope- no   Edema- no   Right shoulder pain Persists despite GH and subacromial injection by Dr Pearletha Forge. Performs daily abduction/external rotation ROM exercises. Able to groom self with right arm.   Chronic back pain Pain is from waist down in morning. Some improvement with hydrocodone/apap in morning but has been having nausea after taking the latest dispense that had 325 mg apap instead of 500 mg apap. No radiation of pain. No weakness in legs. No falls.  Insomnia Takes Ambien less than once a month. Bites off a small piece of Ambien tablet when she takes it. Denies morning grogginess or confusion.  ROS See HPI above Episodes of vertigo about once every three to four months that lasts for less than a minutes.   PMH Smoking Status noted  Lab Review   Potassium  Date Value Range Status  07/30/2012 4.0  3.5 - 5.3 mEq/L Final     Sodium  Date Value Range Status  07/30/2012 142  135 - 145 mEq/L Final     Creat  Date Value Range Status  07/30/2012 0.97  0.50 - 1.10 mg/dL Final     Creatinine, Ser  Date Value Range Status  03/07/2011 0.91  0.4 - 1.2 mg/dL Final         Review of Systems See HPI    Objective:   Physical Exam VS reviewed GEN: Alert, Cooperative, Groomed, NAD COR: RRR, No M/G/R, No JVD, Normal PMI size and location LUNGS: BCTA, No Acc mm use, speaking in full sentences  EXT: No peripheral leg edema. Feet without deformity or lesions. Palpable bilateral pedal pulses.   Gait: Normal speed, No significant path deviation, Step through +,  Psych: Normal affect/thought/speech/language         Assessment & Plan:

## 2013-01-20 NOTE — Assessment & Plan Note (Signed)
Patient with right glenohumeral and acromioclavicular osteoarthritis. Currently remaining active and maintaining fair active range of motion at right shoulder with ROM exercises. Able to perform ADLs.  Not much response to intraarticular injecctions 3 months ago. Pt not interested in referral for Physical Therapy at this time.  Using hydrocodone/apap 5/325 as needed for pain.  Pt uses about one tablet once a day.  It is all right with Dr Mirela Parsley to refill this medications in his absence.

## 2013-01-20 NOTE — Assessment & Plan Note (Signed)
Adequate symptoms control. Tolerating medications.  Using hypnotic sparingly.  Continue current medications.

## 2013-01-20 NOTE — Assessment & Plan Note (Signed)
Persistent issue for patient, but she is not allowing it to inhibit her ADLs and iADLs. Adequate analgesia with hydrocodone/APAP 5/325 though pt having some nausea after taking in morning. Encouraged patient to take analgesic with meals. Nicole Perez may take one Aleve tablet twice a day for uncontrolled pain.

## 2013-01-20 NOTE — Assessment & Plan Note (Signed)
Patient asked to start taking one Cholcalciferol 50,000 IU once a month.

## 2013-01-20 NOTE — Assessment & Plan Note (Signed)
Adequate blood pressure control for age and history of syncope with increasing of antihypertensive therapy.. Tolerating medications.  No new end-organ damage.  Continue current medications.

## 2013-03-06 ENCOUNTER — Other Ambulatory Visit: Payer: Self-pay | Admitting: *Deleted

## 2013-03-06 MED ORDER — DILTIAZEM HCL ER 240 MG PO CP24: 240.0000 mg | ORAL_CAPSULE | Freq: Every day | ORAL | Status: DC

## 2013-03-09 ENCOUNTER — Ambulatory Visit (INDEPENDENT_AMBULATORY_CARE_PROVIDER_SITE_OTHER): Payer: Medicare Other | Admitting: Family Medicine

## 2013-03-09 ENCOUNTER — Encounter: Payer: Self-pay | Admitting: Family Medicine

## 2013-03-09 VITALS — BP 160/85 | HR 78 | Temp 98.3°F | Ht 59.0 in | Wt 163.0 lb

## 2013-03-09 DIAGNOSIS — J209 Acute bronchitis, unspecified: Secondary | ICD-10-CM

## 2013-03-09 DIAGNOSIS — I10 Essential (primary) hypertension: Secondary | ICD-10-CM

## 2013-03-09 MED ORDER — AZITHROMYCIN 250 MG PO TABS: ORAL_TABLET | ORAL | Status: DC

## 2013-03-09 MED ORDER — ALBUTEROL SULFATE HFA 108 (90 BASE) MCG/ACT IN AERS: 2.0000 | INHALATION_SPRAY | Freq: Four times a day (QID) | RESPIRATORY_TRACT | Status: DC | PRN

## 2013-03-09 NOTE — Patient Instructions (Signed)

## 2013-03-09 NOTE — Progress Notes (Signed)
  Subjective:    Patient ID: Nicole Perez, female    DOB: [birth year], 79 y.o.   MRN: [id number]  URI  This is a new problem. The current episode started in the past 7 days. The problem has been waxing and waning. The maximum temperature recorded prior to her arrival was 100 - 100.9 F. Associated symptoms include coughing and diarrhea (resolved). Pertinent negatives include no abdominal pain, chest pain, congestion, nausea, rhinorrhea or vomiting. She has tried antihistamine and decongestant for the symptoms.      Review of Systems  HENT: Negative for congestion and rhinorrhea.   Respiratory: Positive for cough and shortness of breath (mild).   Cardiovascular: Negative for chest pain.  Gastrointestinal: Positive for diarrhea (resolved). Negative for nausea, vomiting and abdominal pain.       Objective:   Physical Exam  Vitals reviewed. Constitutional: She is oriented to person, place, and time. She appears well-developed and well-nourished. No distress.  HENT:  Head: Normocephalic and atraumatic.  Left Ear: Tympanic membrane normal.  Nose: Nose normal.  Mouth/Throat: Oropharynx is clear and moist.  Eyes: No scleral icterus.  Cardiovascular: Normal rate and regular rhythm.   No murmur heard. Pulmonary/Chest: Effort normal. No accessory muscle usage. She has wheezes (diffusely).  Clear to auscultation  Neurological: She is alert and oriented to person, place, and time.  Skin: Skin is warm and dry.          Assessment & Plan:

## 2013-03-09 NOTE — Assessment & Plan Note (Signed)
Given wheezing, low grade fever and cough--will treat with abx, and inhaler.

## 2013-03-18 ENCOUNTER — Encounter: Payer: Self-pay | Admitting: Family Medicine

## 2013-03-18 ENCOUNTER — Ambulatory Visit (INDEPENDENT_AMBULATORY_CARE_PROVIDER_SITE_OTHER): Payer: Medicare Other | Admitting: Family Medicine

## 2013-03-18 VITALS — BP 141/79 | HR 82 | Temp 97.9°F | Ht 59.0 in | Wt 164.0 lb

## 2013-03-18 DIAGNOSIS — J209 Acute bronchitis, unspecified: Secondary | ICD-10-CM

## 2013-03-18 NOTE — Patient Instructions (Signed)
I still agree that this is likely a bronchitis. Your lungs sound MUCH better and I am glad you are improving. I think this is a virus instead of a bacteria. I think you just need more time right now. You can start using the inhaler as needed instead of scheduled twice a day.   If things were to not continue to improve over the next few weeks, please return to see [place], otherwise schedule your next appointment with Dr. Perley Jain. You have deferred all labwork until you see him.   Thanks, Dr. Durene Cal

## 2013-03-18 NOTE — Progress Notes (Signed)
Subjective:   1. Bronchitis follow up/Cough-seen on 3/24 for symptoms of 1 week duration including temp to 100.9, cough (productive greenish sputum), diarrhea (which had resolved), generalized fatigue. Wheezing heard on exam and patient treated for bronchitis with inhaler and azithromycin. She had already tried antihistamines and decongestants at that point.   Today, have completed antibiotic course and still using inhaler twice a day. Cough has stayed similar. Overall, feeling slightly better. Energy level has decreased from baseline but over last week has been slightly improving.  No fevers/chills/nausea/vomiting within the last week.  Mild shortness of breath improved slightly from  a week ago. Sides and some middle back pain from cough. Denies edema. Denies increasing need for pillows (always sleeps with 2 pillows). Denies PND.   ROS--See HPI , denies abdominal pain, chest pain, congestion, nausea, rhinorrhea. No recent travel or prolonged immobilization. No history DVT or PE.   Past Medical History-hyperlipidemia, hypertension, recent treatment for bronchitis, spinal stenosis Social history-lives by herself, no one in home. Supported by daughter at visit today.  Reviewed problem list.  Medications- reviewed and updated Chief complaint-noted  Objective: BP 141/79  Pulse 82  Temp(Src) 97.9 F (36.6 C) (Oral)  Ht 4\' 11"  (1.499 m)  Wt 164 lb (74.39 kg)  BMI 33.11 kg/m2 Constitutional: NAD, resting comfortably  HENT: Nose without rhinorrhea, MMM, oropharynx clear without erythema  Cardiovascular: RRR no mrg. No JVD. Intact peripheral pulses.  ZOX:WRUE nontender Pulmonary/Chest: CTAB. No wheeze as previously heard. Not tacypneic. Appears in no respiratory distress.  Neurological: grossly normal, moves all extremities Skin: Skin is warm and dry.   Assessment/Plan:

## 2013-03-18 NOTE — Assessment & Plan Note (Addendum)
Appears to be improving. I believe main reason for visit today is that Nicole Perez is typically a very active person and she is discouraged that her energy level has not come back fully yet and she is not able to do the typical things she does in the spring. Daughter who is present is in agreement with both I and her mom that overall patient is improving and just needs more time to resolve. Reason patient did not quickly bounce back with antibiotics discussed-that most bronchitis is viral. No wheeze on exam-chanrge albuterol to prn use.   Did discuss CXR for acute illness, CBC, cmet, tsh, repeat vit d levels as has been >1 year and may help [place] with current management (but i strongly doubt PNA or worsening infection or hypothyroidism as cause of low energy) but daughter/patietn opt to complete this with Dr. Perley Jain once he returns.

## 2013-03-29 ENCOUNTER — Emergency Department (HOSPITAL_COMMUNITY)
Admission: EM | Admit: 2013-03-29 | Discharge: 2013-03-29 | Disposition: A | Discharge status: Home / Self Care | Payer: Medicare Other | Attending: Emergency Medicine | Admitting: Emergency Medicine

## 2013-03-29 ENCOUNTER — Emergency Department (HOSPITAL_COMMUNITY): Payer: Medicare Other

## 2013-03-29 ENCOUNTER — Encounter (HOSPITAL_COMMUNITY): Payer: Self-pay

## 2013-03-29 DIAGNOSIS — Z87891 Personal history of nicotine dependence: Secondary | ICD-10-CM | POA: Insufficient documentation

## 2013-03-29 DIAGNOSIS — Z8739 Personal history of other diseases of the musculoskeletal system and connective tissue: Secondary | ICD-10-CM | POA: Insufficient documentation

## 2013-03-29 DIAGNOSIS — E785 Hyperlipidemia, unspecified: Secondary | ICD-10-CM | POA: Insufficient documentation

## 2013-03-29 DIAGNOSIS — Z8349 Family history of other endocrine, nutritional and metabolic diseases: Secondary | ICD-10-CM | POA: Insufficient documentation

## 2013-03-29 DIAGNOSIS — R5381 Other malaise: Secondary | ICD-10-CM | POA: Insufficient documentation

## 2013-03-29 DIAGNOSIS — G4700 Insomnia, unspecified: Secondary | ICD-10-CM | POA: Insufficient documentation

## 2013-03-29 DIAGNOSIS — Z79899 Other long term (current) drug therapy: Secondary | ICD-10-CM | POA: Insufficient documentation

## 2013-03-29 DIAGNOSIS — Z8742 Personal history of other diseases of the female genital tract: Secondary | ICD-10-CM | POA: Insufficient documentation

## 2013-03-29 DIAGNOSIS — M159 Polyosteoarthritis, unspecified: Secondary | ICD-10-CM | POA: Insufficient documentation

## 2013-03-29 DIAGNOSIS — G8929 Other chronic pain: Secondary | ICD-10-CM | POA: Insufficient documentation

## 2013-03-29 DIAGNOSIS — I10 Essential (primary) hypertension: Secondary | ICD-10-CM | POA: Insufficient documentation

## 2013-03-29 DIAGNOSIS — R531 Weakness: Secondary | ICD-10-CM

## 2013-03-29 LAB — CBC WITH DIFFERENTIAL/PLATELET
Basophils Absolute: 0 10*3/uL (ref 0.0–0.1)
Basophils Relative: 1 % (ref 0–1)
Eosinophils Relative: 1 % (ref 0–5)
HCT: 36 % (ref 36.0–46.0)
MCHC: 33.9 g/dL (ref 30.0–36.0)
Monocytes Absolute: 0.2 10*3/uL (ref 0.1–1.0)
Neutro Abs: 3 10*3/uL (ref 1.7–7.7)
RDW: 13.1 % (ref 11.5–15.5)

## 2013-03-29 LAB — URINALYSIS, ROUTINE W REFLEX MICROSCOPIC
Glucose, UA: NEGATIVE mg/dL
Hgb urine dipstick: NEGATIVE
Specific Gravity, Urine: 1.012 (ref 1.005–1.030)

## 2013-03-29 LAB — COMPREHENSIVE METABOLIC PANEL
AST: 17 U/L (ref 0–37)
Albumin: 3.5 g/dL (ref 3.5–5.2)
Calcium: 9.3 mg/dL (ref 8.4–10.5)
Creatinine, Ser: 0.68 mg/dL (ref 0.50–1.10)
Total Protein: 7.2 g/dL (ref 6.0–8.3)

## 2013-03-29 LAB — CARBOXYHEMOGLOBIN
Methemoglobin: 1.1 % (ref 0.0–1.5)
O2 Saturation: 84.1 %
Total hemoglobin: 12.1 g/dL (ref 12.0–16.0)

## 2013-03-29 LAB — POCT I-STAT TROPONIN I: Troponin i, poc: 0 ng/mL (ref 0.00–0.08)

## 2013-03-29 MED ORDER — ACETAMINOPHEN 325 MG PO TABS
650.0000 mg | ORAL_TABLET | Freq: Once | ORAL | Status: AC
  Administered 2013-03-29: 650 mg via ORAL
  Filled 2013-03-29: qty 2

## 2013-03-29 MED ORDER — SODIUM CHLORIDE 0.9 % IV SOLN
INTRAVENOUS | Status: DC
  Administered 2013-03-29: 13:00:00 via INTRAVENOUS

## 2013-03-29 NOTE — ED Notes (Signed)
Patient's granddaughter reports that the patient has slight confusion that she noticed this AM with decreased mobility. MAE. Smile symmetrical and tongue midline. No hand drift, but family member states she has slower movement than usual.
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
## 2013-03-29 NOTE — ED Notes (Signed)
MD at bedside. 

## 2013-03-29 NOTE — ED Notes (Signed)
Ambulated with pulse ox 02 sat 95% on RA tolerated family present

## 2013-03-29 NOTE — ED Notes (Signed)
Triage nurse at bedside.

## 2013-04-20 ENCOUNTER — Ambulatory Visit (INDEPENDENT_AMBULATORY_CARE_PROVIDER_SITE_OTHER): Payer: Medicare Other | Admitting: Family Medicine

## 2013-04-20 ENCOUNTER — Encounter: Payer: Self-pay | Admitting: Family Medicine

## 2013-04-20 VITALS — BP 130/85 | HR 86 | Temp 97.8°F | Ht 59.0 in | Wt 166.5 lb

## 2013-04-20 DIAGNOSIS — I10 Essential (primary) hypertension: Secondary | ICD-10-CM

## 2013-04-20 DIAGNOSIS — M1710 Unilateral primary osteoarthritis, unspecified knee: Secondary | ICD-10-CM

## 2013-04-20 DIAGNOSIS — M170 Bilateral primary osteoarthritis of knee: Secondary | ICD-10-CM

## 2013-04-20 DIAGNOSIS — E669 Obesity, unspecified: Secondary | ICD-10-CM

## 2013-04-20 DIAGNOSIS — G4700 Insomnia, unspecified: Secondary | ICD-10-CM

## 2013-04-20 MED ORDER — HYDROCODONE-ACETAMINOPHEN 7.5-325 MG PO TABS: 1.0000 | ORAL_TABLET | Freq: Four times a day (QID) | ORAL | Status: DC | PRN

## 2013-04-20 NOTE — Patient Instructions (Addendum)
I believe you have osteoarthritis of your knees.   Try the higher dose of hydrocodone-acetaminophen pain pill. You should start to feel some improvement in your knee pain after the corticosteroid injection starting tomorrow.  Your Blood pressure is under good control.  Keep taking your Diltiazem blood pressure medication.

## 2013-04-22 ENCOUNTER — Encounter: Payer: Self-pay | Admitting: Family Medicine

## 2013-04-22 DIAGNOSIS — E669 Obesity, unspecified: Secondary | ICD-10-CM | POA: Insufficient documentation

## 2013-04-22 DIAGNOSIS — M170 Bilateral primary osteoarthritis of knee: Secondary | ICD-10-CM

## 2013-04-22 HISTORY — DX: Bilateral primary osteoarthritis of knee: M17.0

## 2013-04-22 HISTORY — DX: Obesity, unspecified: E66.9

## 2013-04-22 MED ORDER — LIDOCAINE HCL 1 % IJ SOLN: 1.0000 mL | Freq: Once | INTRAMUSCULAR | Status: DC

## 2013-04-22 MED ORDER — TRIAMCINOLONE ACETONIDE 10 MG/ML IJ SUSP: 40.0000 mg | Freq: Once | INTRAMUSCULAR | Status: DC

## 2013-04-22 NOTE — Progress Notes (Signed)
Subjective:    Patient ID: Nicole Perez, female    DOB: 1933-07-28, 79 y.o.   MRN: [id number]  Cough    Mental Status Change Follow up - Pt seen in ED 03/29/13 after unintentional overdose of Ambien.  Pt thought she was taking vicodin. - Symptoms resolved.  No injury or fall during event. - Pt no longer wants to take Ambien.  . Essential hypertension, benign 04/18/2010    Priority: Medium CHRONIC HYPERTENSION  Disease Monitoring  Blood pressure range: not checking at home.   Chest pain: no   Dyspnea: no   Claudication: no   Medication compliance: yes  Medication Side Effects  Lightheadedness: no   Urinary frequency: no   Edema: no    Preventitive Healthcare:  Exercise: no   Salt Restriction: yes    . Osteoarthritis of both knees 04/22/2013     Onset: years ago, recent worsening in both knees, but R>L.  Location: anterior knees Quality: aching Severity: mild to moderate Function: interferes with walking and falling asleep.  Pt still performs her own ADLs and iADLs.   Pattern: recent worsening, particularly R. Course: wax and wane throughout day Radiation: none Relief: no relief with heat or ice.  Hydrocodone/APAP 5/325 no longer helping.  Took a hydrocodone/APAP 7.5 mg/325 that did help with pain Precipitant: no recent injury or fall Associated Symptoms: no weakness in knee. No swelling in knees. No increased warmth of knees.  Trauma (Acute or Chronic): no acute trauma Prior Diagnostic Testing or Treatments: no prior knee Xrays Relevant PMH/PSH: OA in spine and hips. Chronic back pain Spinal stenosis of thoracic and lumbar spine.    Marland Kitchen Spinal stenosis of thoracic region 07/26/2011     Chronic Cough Onset: following acute bronchitisw at end of March. Treated with Azithromycin and Albuterol late March. Has run out of albuterol.    Course gradual improvement Severity: mild but irritating Worse with: deep inspiration Better with: coughing out  phlegm.  Symptoms Sputum:yes  Fever: no  Shortness of breath:no  Leg Swelling:no  Heart Burn or Reflux:no  Wheezing:no  Post Nasal Drip: yes   Red Flags Weight Loss:  no Immunocompromised:  no  PMH Asthma or COPD: no  PMH of Smoking: no  Using ACEIs: no   CHEST XRAY 03/29/13 in ED for acute Mental Status change was unremarkable.  Review of Systems  Respiratory: Positive for cough.         Objective:   Physical Exam  Constitutional: She appears well-nourished. No distress.  HENT:  Right Ear: Tympanic membrane and external ear normal.  Left Ear: Tympanic membrane and external ear normal.  Nose: No rhinorrhea. Right sinus exhibits no maxillary sinus tenderness. Left sinus exhibits no maxillary sinus tenderness.  Mouth/Throat: No oropharyngeal exudate, posterior oropharyngeal edema or posterior oropharyngeal erythema.  Eyes: Conjunctivae are normal.  Neck: No mass and no thyromegaly present.  Cardiovascular: Normal rate, regular rhythm, S1 normal and normal heart sounds.   Pulmonary/Chest: Effort normal. She has wheezes (wheeze on right posterior lung field that cleared after cough.).  Abdominal: Soft. There is no tenderness.  Musculoskeletal: She exhibits no edema.       Right knee: She exhibits normal range of motion, no swelling, no effusion, no ecchymosis and no deformity. Tenderness found. Medial joint line tenderness noted. No MCL, no LCL and no patellar tendon tenderness noted.      Assessment & Plan:   Knee  Injection Procedure Note  Pre-operative Diagnosis: right osteoarthritis  Post-operative Diagnosis:  same  Indications: Symptom relief from osteoarthritis  Anesthesia: Lidocaine 1% without epinephrine and 40 mg solumedrol  Procedure Details   Verbal consent was obtained for the procedure. The joint was prepped with Betadine and a small wheel of anesthetic was injected into the subcutaneous tissue. A 22 gauge needle was inserted into the superior aspect of  the joint from an anterollateral approach. 4ml 1% lidocaine and 1 ml of triamcinolone (KENALOG) 40mg /ml was then injected into the joint through the same needle. The needle was removed and the area cleansed and dressed.  Complications:  None; patient tolerated the procedure well.

## 2013-04-22 NOTE — Assessment & Plan Note (Signed)
Patient has chosen to stop Ambien given her b change in mental status after inadvertently taking Ambien when she thought she was taking Vicodin.  Her dgt and patient agreed to start using a pill box and keeping the hydrocodone/apap tablets separate from her daily medications.

## 2013-05-06 ENCOUNTER — Telehealth: Payer: Self-pay | Admitting: *Deleted

## 2013-05-06 ENCOUNTER — Encounter: Payer: Medicare Other | Admitting: Family Medicine

## 2013-05-06 NOTE — Telephone Encounter (Signed)
Nicole Perez (daughter) left message on Pharmacy/Physician line.  Needing to cancel Nicole Perez appointment today due to patient was up all night with vomiting and diarrhea and doesn't have the energy to come in today for her appointment.  Requesting to speak to someone appointment.  Ileana Ladd

## 2013-05-06 NOTE — Telephone Encounter (Signed)
Called Mrs. Falconi back.  She is agreeable to making another appt but will need her daughter to call b/c of her work schedule.  Advised Mrs. Casselman of the [employer]  She will keep that in mind. [place], [name]

## 2013-05-06 NOTE — Progress Notes (Signed)
This encounter was created in error - please disregard.

## 2013-05-12 ENCOUNTER — Encounter: Payer: Self-pay | Admitting: Family Medicine

## 2013-05-12 ENCOUNTER — Ambulatory Visit (INDEPENDENT_AMBULATORY_CARE_PROVIDER_SITE_OTHER): Payer: Medicare Other | Admitting: Family Medicine

## 2013-05-12 VITALS — BP 128/70 | HR 58 | Temp 98.6°F | Ht 59.0 in | Wt 161.0 lb

## 2013-05-12 DIAGNOSIS — R42 Dizziness and giddiness: Secondary | ICD-10-CM

## 2013-05-12 HISTORY — DX: Dizziness and giddiness: R42

## 2013-05-12 NOTE — Progress Notes (Signed)
Subjective:     Patient ID: Nicole Perez, female   DOB: August 30, 1933, 79 y.o.   MRN: [id number]  HPI Dizziness: C/O dizziness on going for 2 months on and off,mostly whenever she gets up from a lying down position,4 days ago she felt like the kitchen was spinning around her,she had to hold tight to the sink,she denies any fall at that time,no fall related to dizzy spell but she fell about 1 wk ago after tripping on something.She denies any LOC. Denies headache with dizziness episode,no chest pain,no palpitations,no tinnitus,she wears hearing aid on her left ear,occassionally her vision would be blurred with dizziness,with distant walking af times she feels short winded. She discussed this with her PCP during her last visit  Who have her walk around the building and everything was fine then she stated,she denies any GI symptoms,no rectal bleed or blood in her stool.Currently she denies dizziness. Current Outpatient Prescriptions on File Prior to Visit  Medication Sig Dispense Refill  . Cholecalciferol 50000 UNITS TABS Take 1 tablet by mouth every 30 (thirty) days.  12 tablet  1  . diltiazem (DILACOR XR) 240 MG 24 hr capsule Take 1 capsule (240 mg total) by mouth daily.  30 capsule  11  . HYDROcodone-acetaminophen (NORCO) 7.5-325 MG per tablet Take 1 tablet by mouth every 6 (six) hours as needed for pain.  60 tablet  0  . albuterol (PROVENTIL HFA;VENTOLIN HFA) 108 (90 BASE) MCG/ACT inhaler Inhale 2 puffs into the lungs every 6 (six) hours as needed for wheezing.  1 Inhaler  0  . magnesium hydroxide (MILK OF MAGNESIA) 400 MG/5ML suspension Take by mouth as needed.         No current facility-administered medications on file prior to visit.   Past Medical History  Diagnosis Date  . Essential hypertension, benign 04/18/2010  . HYPERLIPIDEMIA 05/10/2010  . Lichen sclerosus et atrophicus of the vulva   . BACK PAIN, CHRONIC 04/18/2010  . DEGENERATIVE JOINT DISEASE, AXIAL SKELETON 04/21/2010  . DEGENERATIVE  JOINT DISEASE, HIPS 09/11/2007  . INSOMNIA, CHRONIC 04/18/2010  . Parotid adenoma 1990, 2012    Right parotid, recurrent parotid pleimorphic adenoma.   Marland Kitchen Spinal stenosis of thoracic region 07/26/2011  . Spinal stenosis of lumbar region 07/26/2011  . Foraminal stenosis of lumbar region 07/26/2011  . Foraminal stenosis of thoracic region 07/26/2011     Review of Systems  Constitutional: Negative for fever and fatigue.  Eyes:       No vision change,uses glasses  Respiratory: Negative.   Cardiovascular: Negative.   Gastrointestinal: Negative.   Genitourinary: Negative.   Neurological: Positive for dizziness. Negative for headaches.  All other systems reviewed and are negative.   Filed Vitals:   05/12/13 1103  BP: 128/70  Pulse: 58  Temp: 98.6 F (37 C)  TempSrc: Oral  Height: 4\' 11"  (1.499 m)  Weight: 161 lb (73.029 kg)       Objective:   Physical Exam  Nursing note and vitals reviewed. Constitutional: She is oriented to person, place, and time. She appears well-developed. No distress.  HENT:  Head: Normocephalic.  Right Ear: External ear and ear canal normal. No middle ear effusion.  Left Ear: Tympanic membrane, external ear and ear canal normal.  No middle ear effusion.  Eyes: Conjunctivae and EOM are normal. Pupils are equal, round, and reactive to light.  Neck: Neck supple.  Cardiovascular: Normal rate, regular rhythm, normal heart sounds and intact distal pulses.   No murmur heard. Pulmonary/Chest:  Effort normal and breath sounds normal. No respiratory distress. She has no wheezes.  Abdominal: Soft. Bowel sounds are normal. She exhibits no distension. There is no tenderness.  Musculoskeletal: Normal range of motion. She exhibits no edema.  Neurological: She is alert and oriented to person, place, and time. She displays no tremor. No cranial nerve deficit or sensory deficit. Coordination normal.  Skin: No pallor.       Assessment:     Dizziness: Likely BPPV,unlikely  cardiac related.    Plan:     Information given on Epley maneuver. I do not recommend Meclizine at this time due to age and her risk of fall.Meclizine will cause dizziness and drowsiness as well. If no improvement in 1 wk to obtain further testing,TSH,CBC and or imaging. Patient advised to return sooner or go to the ER if symptom worsened. She verbalized understanding.

## 2013-05-12 NOTE — Patient Instructions (Addendum)

## 2013-05-12 NOTE — Assessment & Plan Note (Signed)
Dizziness: Likely BPPV,unlikely cardiac related.    Information given on Epley maneuver. I do not recommend Meclizine at this time due to age and her risk of fall.Meclizine will cause dizziness and drowsiness as well. If no improvement in 1 wk to obtain further testing,TSH,CBC and or imaging. Patient advised to return sooner or go to the ER if symptom worsened. She verbalized understanding.

## 2013-06-04 ENCOUNTER — Other Ambulatory Visit: Payer: Self-pay | Admitting: Family Medicine

## 2013-06-04 NOTE — Telephone Encounter (Signed)
Rx called in and pt informed. Delano Scardino Dawn  

## 2013-06-04 NOTE — Telephone Encounter (Signed)
Please call in hydrocodone rx Please let her know she will need an office visit with Dr McDiarmid in August before can get more refills Thanks  LC

## 2013-07-20 ENCOUNTER — Telehealth: Payer: Self-pay | Admitting: Family Medicine

## 2013-07-20 NOTE — Telephone Encounter (Signed)
Daughter is calling for mother Nicole Perez to get a refill on pain medication sent her pharmacy on file. She only wants enough to get her to her appointment 8/25. JW

## 2013-07-20 NOTE — Telephone Encounter (Signed)
Will fwd to Md.  Reggie Bise L, CMA  

## 2013-07-21 ENCOUNTER — Ambulatory Visit (INDEPENDENT_AMBULATORY_CARE_PROVIDER_SITE_OTHER): Payer: Medicare Other | Admitting: Family Medicine

## 2013-07-21 ENCOUNTER — Encounter: Payer: Self-pay | Admitting: Family Medicine

## 2013-07-21 VITALS — BP 151/82 | HR 95 | Temp 98.8°F | Ht 59.0 in | Wt 158.0 lb

## 2013-07-21 DIAGNOSIS — M1990 Unspecified osteoarthritis, unspecified site: Secondary | ICD-10-CM

## 2013-07-21 DIAGNOSIS — R300 Dysuria: Secondary | ICD-10-CM | POA: Insufficient documentation

## 2013-07-21 DIAGNOSIS — M549 Dorsalgia, unspecified: Secondary | ICD-10-CM

## 2013-07-21 DIAGNOSIS — R350 Frequency of micturition: Secondary | ICD-10-CM

## 2013-07-21 DIAGNOSIS — M169 Osteoarthritis of hip, unspecified: Secondary | ICD-10-CM

## 2013-07-21 LAB — POCT URINALYSIS DIPSTICK
Glucose, UA: NEGATIVE
Spec Grav, UA: 1.02
Urobilinogen, UA: 0.2

## 2013-07-21 LAB — POCT UA - MICROSCOPIC ONLY

## 2013-07-21 MED ORDER — CEPHALEXIN 500 MG PO CAPS: 500.0000 mg | ORAL_CAPSULE | Freq: Four times a day (QID) | ORAL | Status: DC

## 2013-07-21 MED ORDER — HYDROCODONE-ACETAMINOPHEN 7.5-325 MG PO TABS: 1.0000 | ORAL_TABLET | Freq: Four times a day (QID) | ORAL | Status: DC | PRN

## 2013-07-21 NOTE — Telephone Encounter (Signed)
Will address at visit, today. Thanks! --CMS

## 2013-07-21 NOTE — Progress Notes (Signed)
  Subjective:    Patient ID: Nicole Perez, female    DOB: 11-12-1933, 79 y.o.   MRN: [id number]  HPI: Pt presents to clinic for SDA for right hip/low back pain, which is chronic nature but worse for the past 3 months, gradually worsening. Pt is typically followed by Dr. Perley Jain and has an appointment with him on 8/25, but felt unable to wait that long and has no pain medications at home. Pt states she has low back pain, aching and sore in nature, with shooting pain down into her right leg as well as numb/tingling sensations in her right foot. Pt also describes a sensation of something being "inside her, from her rectum to her pelvis, that feels like something is too big." Pt takes Norco 7.5-325, usually twice per day, though she occasionally will take 1/2 tablet in the middle of the day; this "helps her pain, but doesn't stop it." Pt is out of Norco for the past 1-2 weeks. Pt denies fever though has occasional chills, and endorses difficulty sleeping due to the pain, which occasionally wakes her up. Pt does endorse 6 lb weight loss since April but states she has been trying to lose some weight. Denies change in bowel habits, flank pain, or CVA tenderness. No abdominal pain, N/V, or diarrhea. Pt does state she is 'due for a liver check,' that Dr. McDiarmid wanted to do (?due to heavy acetaminophen use in Norco).  Of note, pt also complains of increased urinary urge and frequency (states she "must have got up 7 times last night), but no frank dysuria. Pt has had UTI's in the past and wonders if this may be related to her back pain/pelvic discomfort sensation as above. Pt states she has had bladder surgery in the past and wants to ask Dr. McDiarmid for a possible referral to OBGYN ("a lady doctor") to "see if anything has come loose." Denies bleeding, vaginal discharge, or frank itching/burning/dryness.  Review of Systems: As above.     Objective:   Physical Exam BP 151/82  Pulse 95  Temp(Src) 98.8 F  (37.1 C) (Oral)  Ht 4\' 11"  (1.499 m)  Wt 158 lb (71.668 kg)  BMI 31.89 kg/m2  Gen: elderly adult female in NAD, though does appear uncomfortable; very pleasant, accompanied by her daughter HEENT: PERRLA, EOMI, MMM, neck supple, cervical spine with some reduced ROM but no frank pain on exam Cardio: RRR, no murmur Pulm: CTAB, no wheezes Abd: soft, nontender, BS+ MSK: point tenderness in lumbar spine and bilateral bony prominences of posterior pelvis, no paraspinal muscle tenderness or frank spasm  Radicular symptoms elicited on sitting straight leg lift test on right; pain in back from muscle tightness on straight leg lift bilaterally Ext: warm, well-perfused, no LE edema/redness/swelling     Assessment & Plan:

## 2013-07-21 NOTE — Assessment & Plan Note (Addendum)
A: Vague pelvic discomfort and increased urge/frequency, though no frank dysuria. UA with trace blood and leukocytes at this visit. Given increased back pain and urge/frequency, this could represent a UTI, though it certainly does not seem to be complicated or as extensive as a pyelonephritis.  P: Urine culture obtained today. Empiric Rx given for Keflex for 5 days; pending any growth/sensitivities, will plan to call pt with change in abx if necessary. F/u as needed.

## 2013-07-21 NOTE — Patient Instructions (Addendum)
Thank you for coming in, today! For your back pain, I will refill your pain medicine. I will send Dr. McDiarmid a message about what we talked about, today. He will talk to you more about possibly going to see the OBGYN doctors. I will check your urine, as well. I will call you or send you a letter if anything looks unusual. If you have any fevers, worse pain, nausea, vomiting, or change in bowel or bladder habits, come back sooner. Otherwise, keep your appointment with Dr. Perley Jain on 8/25 at 1:45 PM. Please feel free to call with any questions or concerns at any time, at [phone]. --Dr. Casper Harrison

## 2013-07-21 NOTE — Assessment & Plan Note (Addendum)
A: Chronic in nature, related to lumbar and thoracic spinal stenosis, degenerative arthritis of bilateral hips, etc. Pt currently out of Norco, used chronically with decent relief. Pt has upcoming appt with Dr. McDiarmid. No frank red flags such as bowel/bladder incontinence, night sweats, precipitous weight loss, etc, though pt does describe a vague sensation of pelvic discomfort/fullness. Pt also states she is "due for a liver check."  P: Refilled Norco and advised f/u with Dr. Perley Jain; also ordered for [facility] given extensive use of APAP-containing meds. Could consider further work-up depending on further symptoms, though pt appears to have had extensive imaging in the past and by her own admission doesn't have any truly new symptoms directly related to her back/pain. Also with some ?dysuria symptoms; see separate problem list note.

## 2013-07-21 NOTE — Telephone Encounter (Signed)
Will forward to Dr Street 

## 2013-07-21 NOTE — Telephone Encounter (Signed)
Daughter called upset that they haven't heard back anything from the call yesterday.  Her mom is in a lot of pain and her doctor doesn't have an opening until later in August.  I offered her an appt for today at 1:45 with Dr. Casper Harrison since her doctor is still out of town and there wasn't anyone on the [facility] Team that had any openings today.

## 2013-07-22 LAB — COMPREHENSIVE METABOLIC PANEL
Albumin: 4.3 g/dL (ref 3.5–5.2)
Alkaline Phosphatase: 65 U/L (ref 39–117)
BUN: 11 mg/dL (ref 6–23)
CO2: 27 mEq/L (ref 19–32)
Glucose, Bld: 99 mg/dL (ref 70–99)
Potassium: 4.1 mEq/L (ref 3.5–5.3)
Total Bilirubin: 0.3 mg/dL (ref 0.3–1.2)

## 2013-07-22 LAB — URINE CULTURE
Colony Count: NO GROWTH
Organism ID, Bacteria: NO GROWTH

## 2013-08-10 ENCOUNTER — Encounter: Payer: Self-pay | Admitting: Family Medicine

## 2013-08-10 ENCOUNTER — Ambulatory Visit (INDEPENDENT_AMBULATORY_CARE_PROVIDER_SITE_OTHER): Payer: Medicare Other | Admitting: Family Medicine

## 2013-08-10 VITALS — BP 170/80 | HR 72 | Temp 97.8°F | Wt 161.0 lb

## 2013-08-10 DIAGNOSIS — Z23 Encounter for immunization: Secondary | ICD-10-CM

## 2013-08-10 DIAGNOSIS — IMO0002 Reserved for concepts with insufficient information to code with codable children: Secondary | ICD-10-CM

## 2013-08-10 DIAGNOSIS — M169 Osteoarthritis of hip, unspecified: Secondary | ICD-10-CM

## 2013-08-10 DIAGNOSIS — M1610 Unilateral primary osteoarthritis, unspecified hip: Secondary | ICD-10-CM

## 2013-08-10 DIAGNOSIS — M159 Polyosteoarthritis, unspecified: Secondary | ICD-10-CM

## 2013-08-10 DIAGNOSIS — N8111 Cystocele, midline: Secondary | ICD-10-CM

## 2013-08-10 DIAGNOSIS — I10 Essential (primary) hypertension: Secondary | ICD-10-CM

## 2013-08-10 MED ORDER — HYDROCODONE-ACETAMINOPHEN 7.5-325 MG PO TABS: 1.0000 | ORAL_TABLET | Freq: Four times a day (QID) | ORAL | Status: DC | PRN

## 2013-08-10 MED ORDER — HYDROCHLOROTHIAZIDE 12.5 MG PO CAPS: 12.5000 mg | ORAL_CAPSULE | Freq: Every day | ORAL | Status: DC

## 2013-08-10 NOTE — Patient Instructions (Addendum)
Start Hydrochlorothiazide (HCTZ) 12.5 mg daily to help control your blood pressure.  Recheck in one month.    Prolapse  Prolapse means the falling down, bulging, dropping, or drooping of a body part. Organs that commonly prolapse include the rectum, small intestine, bladder, urethra, vagina (birth canal), uterus (womb), and cervix. Prolapse occurs when the ligaments and muscle tissue around the rectum, bladder, and uterus are damaged or weakened.  CAUSES  This happens especially with:  Childbirth. Some women feel pelvic pressure or have trouble holding their urine right after childbirth, because of stretching and tearing of pelvic tissues. This generally gets better with time and the feeling usually goes away, but it may return with aging.  Chronic heavy lifting.  Aging.  Menopause, with loss of estrogen production weakening the pelvic ligaments and muscles.  Past pelvic surgery.  Obesity.  Chronic constipation.  Chronic cough. Prolapse may affect a single organ, or several organs may prolapse at the same time. The front wall of the vagina holds up the bladder. The back wall holds up part of the lower intestine, or rectum. The uterus fills a spot in the middle. All these organs can be involved when the ligaments and muscles around the vagina relax too much. This often gets worse when women stop producing estrogen (menopause). SYMPTOMS  Uncontrolled loss of urine (incontinence) with cough, sneeze, straining, and exercise.  More force may be required to have a bowel movement, due to trapping of the stool.  When part of an organ bulges through the opening of the vagina, there is sometimes a feeling of heaviness or pressure. It may feel as though something is falling out. This sensation increases with coughing or bearing down.  If the organs protrude through the opening of the vagina and rub against the clothing, there may be soreness, ulcers, infection, pain, and bleeding.  Lower  back pain.  Pushing in the upper or lower part of the vagina, to pass urine or have a bowel movement.  Problems having sexual intercourse.  Being unable to insert a tampon or applicator. DIAGNOSIS  Usually, a physical exam is all that is needed to identify the problem. During the examination, you may be asked to cough and strain while lying down, sitting up, and standing up. Your caregiver will determine if more testing is required, such as bladder function tests. Some diagnoses are:  Cystocele: Bulging and falling of the bladder into the top of the vagina.  Rectocele: Part of the rectum bulging into the vagina.  Prolapse of the uterus: The uterus falls or drops into the vagina.  Enterocele: Bulging of the top of the vagina, after a hysterectomy (uterus removal), with the small intestine bulging into the vagina. A hernia in the top of the vagina.  Urethrocele: The urethra (urine carrying tube) bulging into the vagina. TREATMENT  In most cases, prolapse needs to be treated only if it produces symptoms. If the symptoms are interfering with your usual daily or sexual activities, treatment may be necessary. The following are some measures that may be used to treat prolapse.  Estrogen may help elderly women with mild prolapse.  Kegel exercises may help mild cases of prolapse, by strengthening and tightening the muscles of the pelvic floor.  Pessaries are used in women who choose not to, or are unable to, have surgery. A pessary is a doughnut-shaped piece of plastic or rubber that is put into the vagina to keep the organs in place. This device must be fitted by your  caregiver. Your caregiver will also explain how to care for yourself with the pessary. If it works well for you, this may be the only treatment required.  Surgery is often the only form of treatment for more severe prolapses. There are different types of surgery available. You should discuss what the best procedure is for you. If  the uterus is prolapsed, it may be removed (hysterectomy) as part of the surgical treatment. Your caregiver will discuss the risks and benefits with you.  Uterine-vaginal suspension (surgery to hold up the organs) may be used, especially if you want to maintain your fertility. No form of treatment is guaranteed to correct the prolapse or relieve the symptoms. HOME CARE INSTRUCTIONS   Wear a sanitary pad or absorbent product if you have incontinence of urine.  Avoid heavy lifting and straining with exercise and work.  Take over-the-counter pain medicine for minor discomfort.  Try taking estrogen or using estrogen vaginal cream.  Try Kegel exercises or use a pessary, before deciding to have surgery.  Do Kegel exercises after having a baby. SEEK MEDICAL CARE IF:   Your symptoms interfere with your daily activities.  You need medicine to help with the discomfort.  You need to be fitted with a pessary.  You notice bleeding from the vagina.  You think you have ulcers or you notice ulcers on the cervix.  You have an oral temperature above 102 F (38.9 C).  You develop pain or blood with urination.  You have bleeding with a bowel movement.  The symptoms are interfering with your sex life.  You have urinary incontinence that interferes with your daily activities.  You lose urine with sexual intercourse.  You have a chronic cough.  You have chronic constipation. Document Released: 06/09/2003 Document Revised: 02/25/2012 Document Reviewed: 12/18/2009 [facility] Patient Information 2014 [place], [place].

## 2013-08-11 ENCOUNTER — Encounter: Payer: Self-pay | Admitting: Family Medicine

## 2013-08-11 DIAGNOSIS — N814 Uterovaginal prolapse, unspecified: Secondary | ICD-10-CM

## 2013-08-11 DIAGNOSIS — IMO0002 Reserved for concepts with insufficient information to code with codable children: Secondary | ICD-10-CM

## 2013-08-11 DIAGNOSIS — M159 Polyosteoarthritis, unspecified: Secondary | ICD-10-CM

## 2013-08-11 HISTORY — DX: Uterovaginal prolapse, unspecified: N81.4

## 2013-08-11 HISTORY — DX: Polyosteoarthritis, unspecified: M15.9

## 2013-08-11 HISTORY — DX: Reserved for concepts with insufficient information to code with codable children: IMO0002

## 2013-08-11 NOTE — Assessment & Plan Note (Signed)
Inadequate BP control even with standing BP.  Given history of syncope with increasing her Diltiazem in past, will add low dose of HCTZ 12.5 mg daily. Monitor for orthostatic BP changes when comes in for monitoring efficacy.  Pt instructed to stop HCTZ and inform our office if she develops lightheadedness or near-passing out or passing out.  I will try to continue the HCTZ if she has true vertigo.  RTC 4 weeks to monitor efficacy and tolerance.  Will check serum K+ at that time.

## 2013-08-11 NOTE — Progress Notes (Signed)
Patient ID: Nicole Perez, female   DOB: 1933-08-18, 79 y.o.   MRN: [id number]  Subjective:    Patient ID: Nicole Perez, female    DOB: 05-May-1933, 79 y.o.   MRN: [id number]  HPI HYPERTENSION Disease Monitoring: Blood pressure range-not checking at home  Chest pain, palpitations- none      Dyspnea- none Medications: Compliance- taking Diltizxem daily   Lightheadedness,Syncope- no   Edema- no  Pelvic pressure - Onset over 6 months ago - "Feels like something in in the way" when she attempts to urinate or defecate.  She has urinary hesitancy. She does feels like she is able to completely empty her bladder. - Denies urinary incontinence.  No dysuria. (+) urianry frequency. No urinary urgency. - MOM prn helps her avoid constipation.  - PSH significant for repairs of rectocele and cystocele in past.  Last repair around 5 years ago per patient's memory.     Chronic back pain Pain is from waist down in morning, mostly in bothhips and knees.  Some improvement with one hydrocodone/apap 7.5 mg tab twice a day with half tablet if wake from sleep with pain.  No radiation of pain. No weakness in legs. No falls. Performing ADLs and iADLs independently.   Insomnia No longer taking Ambien after accidental overdose back in Winter.   Vertigo Episodes of vertigo about once every couple of months that lasts for less than a minute.  No falls.  No loss of consciousness. No associated dysarthria/dysphagia/diplopia.  PMH Smoking Status noted  Lab Review    Review of Systems See HPI    Objective:   Physical Exam VS reviewed GEN: Alert, Cooperative, Groomed, NAD COR: RRR, No M/G/R, No JVD, Normal PMI size and location LUNGS: BCTA, No Acc mm use, speaking in full sentences GU: Pale and dry vaginal mucosa without ruggae.    Mild Introital stenosis. Tender vaginal mucosa to single digit palpation. Half speculum exam showed anterior vaginal wall descent with further descent with cough. Wall did  not descend into the introitus.  Rectal exam: no masses. Soft stool in vault.  No overt blood on DRE finger.  EXT: No peripheral leg edema. Feet without deformity or lesions. Palpable bilateral pedal pulses.   Gait and climb; Normal speed, No significant path deviation, able to rise from chair without difficulty.  (+)Able to get up on Exam table without assistance.   Psych: Normal affect/thought/speech/language         Assessment & Plan:

## 2013-08-11 NOTE — Assessment & Plan Note (Signed)
New complaint for patient.  Symptom of "something in the way" when she voids or defecates. Patient has had rectocele and cystocele repair in past.  Patient would consider a vaginal pessary for treatment of this symptom.  Pt has seen Dr Rondel Baton (OBGYN) before for these pelvic prolapses.  Will refer patient to Dr Hyacinth Meeker for her evaluation and consideration for vaginal pessary intervention.

## 2013-08-11 NOTE — Assessment & Plan Note (Signed)
Adequate symptom control. Tolerating hydrocodone/apap 7.5/325 twice a day with occasional half tablet at bedtime.  Continue current medications.

## 2013-08-18 ENCOUNTER — Emergency Department (HOSPITAL_COMMUNITY)
Admission: EM | Admit: 2013-08-18 | Discharge: 2013-08-18 | Disposition: A | Discharge status: Home / Self Care | Payer: Medicare Other | Source: Home / Self Care | Attending: Family Medicine | Admitting: Family Medicine

## 2013-08-18 ENCOUNTER — Emergency Department (INDEPENDENT_AMBULATORY_CARE_PROVIDER_SITE_OTHER): Payer: Medicare Other

## 2013-08-18 ENCOUNTER — Encounter (HOSPITAL_COMMUNITY): Payer: Self-pay | Admitting: Emergency Medicine

## 2013-08-18 DIAGNOSIS — M1710 Unilateral primary osteoarthritis, unspecified knee: Secondary | ICD-10-CM

## 2013-08-18 DIAGNOSIS — S93409A Sprain of unspecified ligament of unspecified ankle, initial encounter: Secondary | ICD-10-CM

## 2013-08-18 DIAGNOSIS — IMO0002 Reserved for concepts with insufficient information to code with codable children: Secondary | ICD-10-CM

## 2013-08-18 DIAGNOSIS — M1711 Unilateral primary osteoarthritis, right knee: Secondary | ICD-10-CM

## 2013-08-18 NOTE — ED Notes (Signed)
Patient reports a fall on Sunday.  Patient reports her knee gives away and she looses control of lower leg.  Sunday this happened and she fell.  Now right ankle is painful

## 2013-08-18 NOTE — ED Provider Notes (Signed)
CSN: [id number]     Arrival date & time 08/18/13  1213 History   None    Chief Complaint  Patient presents with  . Fall   (Consider location/radiation/quality/duration/timing/severity/associated sxs/prior Treatment) Patient is a 79 y.o. female presenting with knee pain. The history is provided by the patient.  Knee Pain Location:  Knee and ankle Time since incident:  2 days Injury: yes   Mechanism of injury: fall   Fall:    Fall occurred:  Walking   Entrapped after fall: no   Knee location:  R knee Ankle location:  R ankle Pain details:    Severity:  Mild   Onset quality:  Sudden (knee gives out and falls, twisting ankle.)   Past Medical History  Diagnosis Date  . Essential hypertension, benign 04/18/2010  . HYPERLIPIDEMIA 05/10/2010  . Lichen sclerosus et atrophicus of the vulva   . BACK PAIN, CHRONIC 04/18/2010  . DEGENERATIVE JOINT DISEASE, AXIAL SKELETON 04/21/2010  . DEGENERATIVE JOINT DISEASE, HIPS 09/11/2007  . INSOMNIA, CHRONIC 04/18/2010  . Parotid adenoma 1990, 2012    Right parotid, recurrent parotid pleimorphic adenoma.   Marland Kitchen Spinal stenosis of thoracic region 07/26/2011  . Spinal stenosis of lumbar region 07/26/2011  . Foraminal stenosis of lumbar region 07/26/2011  . Foraminal stenosis of thoracic region 07/26/2011  . Osteoarthritis, multiple sites 08/11/2013  . COLONIC POLYPS, HX OF 12/17/2004    Annotation: polypectomy during colonoscopy in 2006 Qualifier: Diagnosis of  By: McDiarmid MD, Tawanna Cooler    . Cystocele 08/11/2013   Past Surgical History  Procedure Laterality Date  . Total abdominal hysterectomy w/ bilateral salpingoophorectomy  1964    Hysterectomy and bilateral oopherectomy at age 53 for benign reasons  . Bladder suspension      Bladder tack x 2 (Dr Brunilda Payor)  . Cystocele repair      Rectal prolapse and cyctocele adter hysterectomy requiring anterior repair Judie Petit Leda Quail, MD)  . Rectocele repair      Rectal prolapse and cyctocele adter hysterectomy requiring  anterior repair Judie Petit Leda Quail, MD)  . Urethral dilation    . Parotid gland tumor excision  1990    S/P excision of Right Parotid Gland Benign Tumor, 1990  . Reconstruction of nose  1994    Nasal bridge reconstruction (Dr Micki Riley, 1994) for following  Forklift accident on job. Surgery complicated by nerve damage resulting in difficulty raising right eyebrow   . Carpal tunnel release  2010    Carpel Tunnel Release of  left wrist  03/2009 (Dr Merlyn Lot): Nerve   . Cataract extraction w/ intraocular lens implant    . Breast lumpectomy      Lumpectomy of benign Breast lumps bilaterally, Dr Lurene Shadow  . Laminectomy      S/P L4-5 Laminectomy (1987) for decompression of spinal stenosis  . Colonoscopy w/ polypectomy  2006  . Parotidectomy  08/30/11    Narda Bonds, MD (ENT) for recurrent right parotid pleomorphic adenoma by frozen section   Family History  Problem Relation Age of Onset  . Coronary artery disease Mother   . Coronary artery disease Sister   . Diabetes type II Sister   . Stroke Father 51  . Hypertension Father   . Lupus Brother    History  Substance Use Topics  . Smoking status: Former Games developer  . Smokeless tobacco: Never Used  . Alcohol Use: No   OB History   Grav Para Term Preterm Abortions TAB SAB Ect Mult Living  Review of Systems  Allergies  Review of patient's allergies indicates no known allergies.  Home Medications   Current Outpatient Rx  Name  Route  Sig  Dispense  Refill  . albuterol (PROVENTIL HFA;VENTOLIN HFA) 108 (90 BASE) MCG/ACT inhaler   Inhalation   Inhale 2 puffs into the lungs every 6 (six) hours as needed for wheezing.   1 Inhaler   0   . Cholecalciferol 50000 UNITS TABS   Oral   Take 1 tablet by mouth every 30 (thirty) days.   12 tablet   1   . diltiazem (DILACOR XR) 240 MG 24 hr capsule   Oral   Take 1 capsule (240 mg total) by mouth daily.   30 capsule   11   . hydrochlorothiazide (MICROZIDE) 12.5 MG capsule    Oral   Take 1 capsule (12.5 mg total) by mouth daily.   30 capsule   PRN   . HYDROcodone-acetaminophen (NORCO) 7.5-325 MG per tablet   Oral   Take 1 tablet by mouth every 6 (six) hours as needed for pain.   60 tablet   5   . magnesium hydroxide (MILK OF MAGNESIA) 400 MG/5ML suspension   Oral   Take by mouth as needed.            BP 179/81  Pulse 64  Temp(Src) 98.1 F (36.7 C) (Oral)  Resp 20  SpO2 98% Physical Exam  Nursing note and vitals reviewed. Constitutional: She is oriented to person, place, and time. She appears well-developed and well-nourished.  HENT:  Head: Normocephalic and atraumatic.  Neck: Normal range of motion. Neck supple.  Musculoskeletal: She exhibits tenderness.       Right knee: She exhibits bony tenderness and abnormal meniscus. She exhibits normal range of motion, no swelling and no effusion. Tenderness found. Medial joint line tenderness noted. No lateral joint line and no patellar tendon tenderness noted.       Right ankle: She exhibits decreased range of motion and swelling. She exhibits no deformity and normal pulse. No head of 5th metatarsal and no proximal fibula tenderness found.  Neurological: She is alert and oriented to person, place, and time.  Skin: Skin is warm and dry.    ED Course  Procedures (including critical care time) Labs Review Labs Reviewed - No data to display Imaging Review Dg Ankle Complete Right  08/18/2013   *RADIOLOGY REPORT*  Clinical Data: Right ankle pain after a fall.  RIGHT ANKLE - COMPLETE 3+ VIEW  Comparison: None.  Findings: There is some soft tissue swelling about the ankle.  No fracture or dislocation is identified.  Small tibiotalar joint effusion is noted.  Mild enthesopathic change at the Achilles tendon insertion is seen.  IMPRESSION:  1.  Soft tissue swelling without underlying fracture. 2.  Tibiotalar joint effusion.   Original Report Authenticated By: Holley Dexter, M.D.   Dg Knee Complete 4 Views  Right  08/18/2013   *RADIOLOGY REPORT*  Clinical Data: Knee pain, fall  RIGHT KNEE - COMPLETE 4+ VIEW  Comparison: None.  Findings: Moderate tricompartmental degenerative changes, most prominent in the medial compartment.  No fracture or dislocation is seen.  No definite suprapatellar knee joint effusion.  IMPRESSION: Moderate tricompartmental degenerative changes, most prominent in the medial compartment.   Original Report Authenticated By: Charline Bills, M.D.    MDM   1. Right knee DJD   2. Sprain of ankle and foot, right, initial encounter    X-rays reviewed and report per  radiologist.     Linna Hoff, MD 08/18/13 [id number]

## 2013-08-28 ENCOUNTER — Encounter: Payer: Self-pay | Admitting: Obstetrics & Gynecology

## 2013-09-11 ENCOUNTER — Encounter: Payer: Self-pay | Admitting: Obstetrics & Gynecology

## 2013-09-11 ENCOUNTER — Ambulatory Visit (INDEPENDENT_AMBULATORY_CARE_PROVIDER_SITE_OTHER): Payer: Medicare Other | Admitting: Obstetrics & Gynecology

## 2013-09-11 VITALS — BP 168/82 | HR 60 | Resp 16 | Ht 59.25 in | Wt 160.8 lb

## 2013-09-11 DIAGNOSIS — N8111 Cystocele, midline: Secondary | ICD-10-CM

## 2013-09-11 DIAGNOSIS — R339 Retention of urine, unspecified: Secondary | ICD-10-CM

## 2013-09-11 DIAGNOSIS — IMO0002 Reserved for concepts with insufficient information to code with codable children: Secondary | ICD-10-CM

## 2013-09-11 NOTE — Progress Notes (Signed)
79 y.o. G8P8 WidowedAfrican AmericanF here for new patient appointment.  Patient reports feeling bladder bulge out of vagina.  H/O TAH after birth of 8th child when she was 18 years old.  Has had 2 bladder repairs, one including rectocele repair.  Sometimes feels like she needs to go back to the bathroom right after voiding to empty bladder more completely.   No recent UTI's and no significant hx of GU infections.   No vaginal bleeding or discharge.  No dysuria.  Gets up at least once at night but pt reports this isn't bothersome.  States she really doesn't want anything done, particularly surgery.  Is here because Dr. Sherron Monday asked her to come.  Patient's last menstrual period was 12/17/1962.          Sexually active: no  The current method of family planning is status post hysterectomy.    Exercising: no  not regularly Smoker:  No/former smoker  Health Maintenance: Pap:  2013 History of abnormal Pap:  no MMG:  12/04/12 normal Colonoscopy:  Years ago BMD:   none TDaP:  Up to date with PCP Screening Labs: PCP, Hb today: PCP, Urine today: PCP   reports that she has quit smoking. She has never used smokeless tobacco. She reports that she does not drink alcohol or use illicit drugs.  Past Medical History  Diagnosis Date  . Essential hypertension, benign 04/18/2010  . Lichen sclerosus et atrophicus of the vulva   . BACK PAIN, CHRONIC 04/18/2010  . DEGENERATIVE JOINT DISEASE, AXIAL SKELETON 04/21/2010  . DEGENERATIVE JOINT DISEASE, HIPS 09/11/2007  . INSOMNIA, CHRONIC 04/18/2010  . Parotid adenoma 1990, 2012    Right parotid, recurrent parotid pleimorphic adenoma.   Marland Kitchen Spinal stenosis of thoracic region 07/26/2011  . Spinal stenosis of lumbar region 07/26/2011  . Foraminal stenosis of lumbar region 07/26/2011  . Foraminal stenosis of thoracic region 07/26/2011  . Osteoarthritis, multiple sites 08/11/2013  . COLONIC POLYPS, HX OF 12/17/2004    Annotation: polypectomy during colonoscopy in 2006 Qualifier:  Diagnosis of  By: McDiarmid MD, Tawanna Cooler    . Cystocele 08/11/2013  . Fall     recently x3    Past Surgical History  Procedure Laterality Date  . Total abdominal hysterectomy w/ bilateral salpingoophorectomy  1964    Hysterectomy and bilateral oopherectomy at age 61 for benign reasons  . Bladder suspension      Bladder tack x 2 (Dr Brunilda Payor)  . Cystocele repair      Rectal prolapse and cyctocele adter hysterectomy requiring anterior repair Judie Petit Leda Quail, MD)  . Rectocele repair      Rectal prolapse and cyctocele adter hysterectomy requiring anterior repair Judie Petit Leda Quail, MD)  . Urethral dilation    . Parotid gland tumor excision  1990    S/P excision of Right Parotid Gland Benign Tumor, 1990  . Reconstruction of nose  1994    Nasal bridge reconstruction (Dr Micki Riley, 1994) for following  Forklift accident on job. Surgery complicated by nerve damage resulting in difficulty raising right eyebrow   . Carpal tunnel release  2010    Carpel Tunnel Release of  left wrist  03/2009 (Dr Merlyn Lot): Nerve   . Cataract extraction w/ intraocular lens implant    . Breast lumpectomy      Lumpectomy of benign Breast lumps bilaterally, Dr Lurene Shadow  . Laminectomy      S/P L4-5 Laminectomy (1987) for decompression of spinal stenosis  . Colonoscopy w/ polypectomy  2006  . Parotidectomy  08/30/11    Narda Bonds, MD (ENT) for recurrent right parotid pleomorphic adenoma by frozen section  . Carpal tunnel release      right wrist    Current Outpatient Prescriptions  Medication Sig Dispense Refill  . diltiazem (DILACOR XR) 240 MG 24 hr capsule Take 1 capsule (240 mg total) by mouth daily.  30 capsule  11  . hydrochlorothiazide (MICROZIDE) 12.5 MG capsule Take 1 capsule (12.5 mg total) by mouth daily.  30 capsule  PRN  . HYDROcodone-acetaminophen (NORCO) 7.5-325 MG per tablet Take 1 tablet by mouth every 6 (six) hours as needed for pain.  60 tablet  5  . magnesium hydroxide (MILK OF MAGNESIA) 400 MG/5ML  suspension Take by mouth as needed.        . meloxicam (MOBIC) 15 MG tablet 15 mg daily.      Marland Kitchen albuterol (PROVENTIL HFA;VENTOLIN HFA) 108 (90 BASE) MCG/ACT inhaler Inhale 2 puffs into the lungs every 6 (six) hours as needed for wheezing.  1 Inhaler  0   No current facility-administered medications for this visit.    Family History  Problem Relation Age of Onset  . Coronary artery disease Mother   . Coronary artery disease Sister     open heart surgery  . Diabetes type II Sister   . Stroke Father 108  . Hypertension Father   . Lupus Brother   . Heart disease Brother   . Heart attack Sister     ROS:  Pertinent items are noted in HPI.  Otherwise, a comprehensive ROS was negative.  Exam:   BP 168/82  Pulse 60  Resp 16  Ht 4' 11.25" (1.505 m)  Wt 160 lb 12.8 oz (72.938 kg)  BMI 32.2 kg/m2  LMP 12/17/1962   Height:   Height: 4' 11.25" (150.5 cm)  Ht Readings from Last 3 Encounters:  09/11/13 4' 11.25" (1.505 m)  07/21/13 4\' 11"  (1.499 m)  05/12/13 4\' 11"  (1.499 m)    General appearance: alert, cooperative and appears stated age Head: Normocephalic, without obvious abnormality, atraumatic Abdomen: soft, non-tender; bowel sounds normal; no masses,  no organomegaly, midline scar well healed and noted Extremities: extremities normal, atraumatic, no cyanosis or edema Skin: Skin color, texture, turgor normal. No rashes or lesions Lymph nodes: Cervical, supraclavicular, and axillary nodes normal. No abnormal inguinal nodes palpated Neurologic: Grossly normal   Pelvic: External genitalia:  no lesions              Urethra:  normal appearing urethra with no masses, tenderness or lesions              Bartholins and Skenes: normal                 Vagina: normal appearing vagina with normal color and discharge, no lesions except for 4th degree cystocele, vagina is short              Cervix: absent              Pap taken: no Bimanual Exam:  Uterus:  uterus absent              Adnexa:  normal adnexa and no mass, fullness, tenderness               Rectovaginal: Confirms               Anus:  normal sphincter tone, no lesions  Discussed with pt options for treatment including surgical, pessary use, and nothing.  She would like to try a pessary.  Pt initially fitted with #3 incontinence ring with support.  This was too uncomfortable for pt.  Fitted with #1 incontinence ring with support.  Was able to expel pessary with Valsalva maneuver.  #2 incontinence ring with support seemed a better fit.  Pt able to also expel this one in restroom with Pessary.  At this point, pt was having a little vaginal bleeding from placement of pessaries.  She declined having anything else done at this time.  She was very nice about it but is not sure she wants to do ANYTHING.  She states she will think about it and let me know if desires to continue with pessary fitting.  A:  4th degree cystocele in pt with h/o TAH Incomplete bladder emptying  P:   For now, pt declines any other procedures.  After three pessary fittings today, she does not feel she wants a pessary.  She has my number to call if she changes her mind.

## 2013-09-13 NOTE — Patient Instructions (Signed)
Please call if you desire another appointment for pessary fitting.

## 2013-09-22 ENCOUNTER — Ambulatory Visit: Payer: Medicare Other | Attending: Orthopedic Surgery

## 2013-09-22 DIAGNOSIS — IMO0001 Reserved for inherently not codable concepts without codable children: Secondary | ICD-10-CM | POA: Insufficient documentation

## 2013-09-22 DIAGNOSIS — M25673 Stiffness of unspecified ankle, not elsewhere classified: Secondary | ICD-10-CM | POA: Insufficient documentation

## 2013-09-22 DIAGNOSIS — M2550 Pain in unspecified joint: Secondary | ICD-10-CM | POA: Insufficient documentation

## 2013-09-22 DIAGNOSIS — R262 Difficulty in walking, not elsewhere classified: Secondary | ICD-10-CM | POA: Insufficient documentation

## 2013-09-22 DIAGNOSIS — M25676 Stiffness of unspecified foot, not elsewhere classified: Secondary | ICD-10-CM | POA: Insufficient documentation

## 2013-09-22 DIAGNOSIS — M6281 Muscle weakness (generalized): Secondary | ICD-10-CM | POA: Insufficient documentation

## 2013-09-25 ENCOUNTER — Telehealth: Payer: Self-pay | Admitting: Family Medicine

## 2013-09-25 DIAGNOSIS — M169 Osteoarthritis of hip, unspecified: Secondary | ICD-10-CM

## 2013-09-25 MED ORDER — HYDROCODONE-ACETAMINOPHEN 7.5-325 MG/15ML PO SOLN: 15.0000 mL | Freq: Four times a day (QID) | ORAL | Status: DC | PRN

## 2013-09-25 MED ORDER — HYDROCODONE-ACETAMINOPHEN 7.5-325 MG/15ML PO SOLN
15.0000 mL | Freq: Four times a day (QID) | ORAL | Status: DC | PRN
Start: 2013-09-25 — End: 2013-12-01

## 2013-09-25 NOTE — Telephone Encounter (Signed)
Please notify patient must see Dr Levonne Lapping before more refills

## 2013-09-25 NOTE — Telephone Encounter (Signed)
Pt would like a refill on her hydrocodone left up front for pickup. JW

## 2013-09-25 NOTE — Telephone Encounter (Signed)
Will forward to MD. Jazmin Hartsell,CMA  

## 2013-09-25 NOTE — Telephone Encounter (Signed)
Daughter is aware that rx is ready for pick up.  Jazmin Hartsell,CMA

## 2013-09-28 ENCOUNTER — Telehealth: Payer: Self-pay | Admitting: Family Medicine

## 2013-09-28 NOTE — Telephone Encounter (Signed)
Daughter calls stating that refill for hydrocodone was written as a liquid solution. She has never taken the liquid form and is confused about the dosages. Please call daughter at [phone].

## 2013-09-28 NOTE — Telephone Encounter (Signed)
Informed daughter that new rx is ready at front desk. Jatniel Verastegui,CMA

## 2013-09-28 NOTE — Telephone Encounter (Signed)
Hydrocodone liquid was incorrect.  Handwrote 2 Rxs each for hydrocodone 7.5/325 # 60 Will give to [employer]

## 2013-09-28 NOTE — Telephone Encounter (Signed)
Please advise. Delani Kohli,CMA  

## 2013-10-12 ENCOUNTER — Ambulatory Visit: Payer: Medicare Other | Admitting: Rehabilitation

## 2013-10-12 ENCOUNTER — Ambulatory Visit: Payer: Medicare Other

## 2013-10-22 ENCOUNTER — Ambulatory Visit: Payer: Medicare Other | Attending: Orthopedic Surgery | Admitting: Rehabilitation

## 2013-10-22 DIAGNOSIS — IMO0001 Reserved for inherently not codable concepts without codable children: Secondary | ICD-10-CM | POA: Insufficient documentation

## 2013-10-22 DIAGNOSIS — M2550 Pain in unspecified joint: Secondary | ICD-10-CM | POA: Insufficient documentation

## 2013-10-22 DIAGNOSIS — M25673 Stiffness of unspecified ankle, not elsewhere classified: Secondary | ICD-10-CM | POA: Insufficient documentation

## 2013-10-22 DIAGNOSIS — M6281 Muscle weakness (generalized): Secondary | ICD-10-CM | POA: Insufficient documentation

## 2013-10-22 DIAGNOSIS — R262 Difficulty in walking, not elsewhere classified: Secondary | ICD-10-CM | POA: Insufficient documentation

## 2013-10-22 DIAGNOSIS — M25676 Stiffness of unspecified foot, not elsewhere classified: Secondary | ICD-10-CM | POA: Insufficient documentation

## 2013-11-04 ENCOUNTER — Other Ambulatory Visit: Payer: Self-pay | Admitting: Family Medicine

## 2013-11-04 DIAGNOSIS — Z1231 Encounter for screening mammogram for malignant neoplasm of breast: Secondary | ICD-10-CM

## 2013-11-05 ENCOUNTER — Ambulatory Visit (INDEPENDENT_AMBULATORY_CARE_PROVIDER_SITE_OTHER): Payer: Medicare Other | Admitting: Neurology

## 2013-11-05 ENCOUNTER — Encounter: Payer: Self-pay | Admitting: Neurology

## 2013-11-05 VITALS — BP 169/86 | HR 83 | Temp 97.2°F | Ht 59.0 in | Wt 159.0 lb

## 2013-11-05 DIAGNOSIS — G609 Hereditary and idiopathic neuropathy, unspecified: Secondary | ICD-10-CM

## 2013-11-05 DIAGNOSIS — G5731 Lesion of lateral popliteal nerve, right lower limb: Secondary | ICD-10-CM

## 2013-11-05 DIAGNOSIS — R6889 Other general symptoms and signs: Secondary | ICD-10-CM

## 2013-11-05 DIAGNOSIS — G5730 Lesion of lateral popliteal nerve, unspecified lower limb: Secondary | ICD-10-CM

## 2013-11-05 DIAGNOSIS — Z9189 Other specified personal risk factors, not elsewhere classified: Secondary | ICD-10-CM

## 2013-11-05 DIAGNOSIS — R29818 Other symptoms and signs involving the nervous system: Secondary | ICD-10-CM

## 2013-11-05 DIAGNOSIS — M216X9 Other acquired deformities of unspecified foot: Secondary | ICD-10-CM

## 2013-11-05 DIAGNOSIS — Z9289 Personal history of other medical treatment: Secondary | ICD-10-CM

## 2013-11-05 DIAGNOSIS — M21371 Foot drop, right foot: Secondary | ICD-10-CM

## 2013-11-05 NOTE — Patient Instructions (Addendum)
I think overall you are doing fairly well but I do want to suggest a few things today:  Remember to drink plenty of fluid, eat healthy meals and do not skip any meals. Try to eat protein with a every meal and eat a healthy snack such as fruit or nuts in between meals. Try to keep a regular sleep-wake schedule and try to exercise daily, particularly in the form of walking, 20-30 minutes a day, if you can.   Continue with outpatient PT.   As far as your medications are concerned, I would like to suggest no new medications at this time.    As far as diagnostic testing: I would like to investigate things further to look for evidence of neuropathy or nerve damage; therefore, I would like to do some blood work, MRI lumbar spine, and electrical testing of your muscles and nerves, which is known as EMG/NCV. Neuropathy or nerve disease or damage can be caused by a variety of causes, most commonly diabetes, some toxins including alcohol or metabolic derangements or hereditary disorders.   I would like to see you back in 3 months, sooner if we need to. Please call us with any interim questions, concerns, problems, updates or refill requests.  Please also call us for any test results so we can go over those with you on the phone. Brett Canales is my clinical assistant and will answer any of your questions and relay your messages to me and also relay most of my messages to you.  Our phone number is [phone]. We also have an after hours call service for urgent matters and there is a physician on-call for urgent questions. For any emergencies you know to call 911 or go to the nearest emergency room.

## 2013-11-05 NOTE — Progress Notes (Signed)
Subjective:    Patient ID: Nicole Perez is a 79 y.o. female.  HPI   Huston Foley, MD, PhD [facility] [address], [address] [address] [place], [place] [address]  Dear Dr. Eulah Pont,   I saw your patient, Nicole Perez, upon your kind request in my neurologic clinic today for initial consultation of her neuropathy, in particular peroneal nerve d/s. The patient is accompanied by her daughter, Nicole Perez, today. As you know, Ms. Fetterolf is a very pleasant 79 year old right-handed woman with an underlying medical history of obesity, insomnia, degenerative arthritis, degenerative back disease including spinal stenosis, hypertension, hyperlipidemia, vitamin D deficiency, and hearing loss, who reports She has a history of back surgery about 20 years ago and has been undergoing physical therapy. She has a history of right foot weakness and decreased sensation in the right leg. She had EMG and nerve conduction studies in your office on 10/20/2013 which showed and decreased right peroneal nerve function and evidence of superimposed chronic lumbar radiculopathy affecting L4, L5 and potentially S1 on the right. She has severe right knee arthritis and is status post injection on 08/26/2013. I also reviewed her x-ray reports which includes right ankle x-ray from 08/18/2013 showing soft tissue swelling without underlying fracture and tibiotalar joint effusion. She had a right knee x-ray on 08/18/2013 which showed moderate tricompartmental degenerative changes, most prominent in the medial compartment. She fell in September and twisted her R foot and has had numbness in her foot since then. She has been in PT and has been given a 2 wheeled walker. She has not fallen since then. She has trouble picking up her R foot.   Her Past Medical History Is Significant For: Past Medical History  Diagnosis Date  . Essential hypertension, benign 04/18/2010  . Lichen sclerosus et atrophicus of the vulva   .  BACK PAIN, CHRONIC 04/18/2010  . DEGENERATIVE JOINT DISEASE, AXIAL SKELETON 04/21/2010  . DEGENERATIVE JOINT DISEASE, HIPS 09/11/2007  . INSOMNIA, CHRONIC 04/18/2010  . Parotid adenoma 1990, 2012    Right parotid, recurrent parotid pleimorphic adenoma.   Marland Kitchen Spinal stenosis of thoracic region 07/26/2011  . Spinal stenosis of lumbar region 07/26/2011  . Foraminal stenosis of lumbar region 07/26/2011  . Foraminal stenosis of thoracic region 07/26/2011  . Osteoarthritis, multiple sites 08/11/2013  . COLONIC POLYPS, HX OF 12/17/2004    Annotation: polypectomy during colonoscopy in 2006 Qualifier: Diagnosis of  By: McDiarmid MD, Tawanna Cooler    . Cystocele 08/11/2013  . Fall     recently x3    Her Past Surgical History Is Significant For: Past Surgical History  Procedure Laterality Date  . Total abdominal hysterectomy w/ bilateral salpingoophorectomy  1964    Hysterectomy and bilateral oopherectomy at age 41 for benign reasons  . Bladder suspension      Bladder tack x 2 (Dr Brunilda Payor)  . Cystocele repair      Rectal prolapse and cyctocele adter hysterectomy requiring anterior repair Judie Petit Leda Quail, MD)  . Rectocele repair      Rectal prolapse and cyctocele adter hysterectomy requiring anterior repair Judie Petit Leda Quail, MD)  . Urethral dilation    . Parotid gland tumor excision  1990    S/P excision of Right Parotid Gland Benign Tumor, 1990  . Reconstruction of nose  1994    Nasal bridge reconstruction (Dr Micki Riley, 1994) for following  Forklift accident on job. Surgery complicated by nerve damage resulting in difficulty raising right eyebrow   . Carpal tunnel  release  2010    Carpel Tunnel Release of  left wrist  03/2009 (Dr Merlyn Lot): Nerve   . Cataract extraction w/ intraocular lens implant    . Breast lumpectomy      Lumpectomy of benign Breast lumps bilaterally, Dr Lurene Shadow  . Laminectomy      S/P L4-5 Laminectomy (1987) for decompression of spinal stenosis  . Colonoscopy w/ polypectomy  2006  . Parotidectomy   08/30/11    Narda Bonds, MD (ENT) for recurrent right parotid pleomorphic adenoma by frozen section  . Carpal tunnel release      right wrist    Her Family History Is Significant For: Family History  Problem Relation Age of Onset  . Coronary artery disease Mother   . Coronary artery disease Sister     open heart surgery  . Diabetes type II Sister   . Stroke Father 31  . Hypertension Father   . Lupus Brother   . Heart disease Brother   . Heart attack Sister     Her Social History Is Significant For: History   Social History  . Marital Status: Widowed    Spouse Name: N/A    Number of Children: N/A  . Years of Education: N/A   Occupational History  . retired    Social History Main Topics  . Smoking status: Former Games developer  . Smokeless tobacco: Never Used  . Alcohol Use: No  . Drug Use: No  . Sexual Activity: No     Comment: hysterectomy   Other Topics Concern  . None   Social History Narrative   Has 8 children   [name], [name], involved in her care   4 brothers, 5 sisters, 3 deceased siblings   Cares for her great-grandchildren during the day   Widowed in 2009   Lives alone.   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              Her Allergies Are:  No Known Allergies:   Her Current Medications Are:  Outpatient Encounter Prescriptions as of 11/05/2013  Medication Sig  . albuterol (PROVENTIL HFA;VENTOLIN HFA) 108 (90 BASE) MCG/ACT inhaler Inhale 2 puffs into the lungs every 6 (six) hours as needed for wheezing.  . diltiazem (DILACOR XR) 240 MG 24 hr capsule Take 1 capsule (240 mg total) by mouth daily.  . hydrochlorothiazide (MICROZIDE) 12.5 MG capsule Take 1 capsule (12.5 mg total) by mouth daily.  Marland Kitchen HYDROcodone-acetaminophen (HYCET) 7.5-325 mg/15 ml solution Take 15 mLs by mouth 4 (four)  times daily as needed for pain.  . magnesium hydroxide (MILK OF MAGNESIA) 400 MG/5ML suspension Take by mouth as needed.    . meloxicam (MOBIC) 15 MG tablet 15 mg daily.  . [DISCONTINUED] HYDROcodone-acetaminophen (HYCET) 7.5-325 mg/15 ml solution Take 15 mLs by mouth 4 (four) times daily as needed for pain. Fill 30 days after write date  . [DISCONTINUED] HYDROcodone-acetaminophen (NORCO) 7.5-325 MG per tablet Take 1 tablet by mouth every 6 (six) hours as needed for pain.   Review of Systems:  Out of a complete 14 point review of systems, all are reviewed and negative with the exception of these symptoms as listed below:   Review of Systems  HENT: Positive for hearing loss.  Eyes: Positive for visual disturbance (blurred vision).  Respiratory: Negative.   Cardiovascular: Negative.   Gastrointestinal: Negative.   Endocrine: Positive for heat intolerance.  Genitourinary: Negative.   Musculoskeletal: Positive for arthralgias and myalgias.  Skin: Negative.   Allergic/Immunologic: Negative.   Neurological: Positive for weakness and numbness.  Psychiatric/Behavioral: Negative.     Objective:  Neurologic Exam  Physical Exam Physical Examination:   Filed Vitals:   11/05/13 1336  BP: 169/86  Pulse: 83  Temp: 97.2 F (36.2 C)    General Examination: The patient is a very pleasant 79 y.o. female in no acute distress. She appears well-developed and well-nourished and well groomed. She is obese.  HEENT: Normocephalic, atraumatic, pupils are equal, round and reactive to light and accommodation. Extraocular tracking is good without limitation to gaze excursion or nystagmus noted. Normal smooth pursuit is noted. Hearing is grossly intact. Face is symmetric with normal facial animation and normal facial sensation. Speech is clear with no dysarthria noted. There is no hypophonia. There is no lip, neck/head, jaw or voice tremor. Neck is supple with full range of passive and active motion. There  are no carotid bruits on auscultation. Oropharynx exam reveals: mild mouth dryness, adequate dental hygiene and moderate airway crowding, due to large tongue and redundant soft palate. Mallampati is class II. Tongue protrudes centrally and palate elevates symmetrically. Tonsils are small.   Chest: Clear to auscultation without wheezing, rhonchi or crackles noted.  Heart: S1+S2+0, regular and normal without murmurs, rubs or gallops noted.   Abdomen: Soft, non-tender and non-distended with normal bowel sounds appreciated on auscultation.  Extremities: There is no pitting edema in the distal lower extremities bilaterally. Pedal pulses are intact.  Skin: Warm and dry without trophic changes noted. There are no varicose veins.  Musculoskeletal: exam reveals no obvious joint deformities, tenderness or joint swelling or erythema.   Neurologically:  Mental status: The patient is awake, alert and oriented in all 4 spheres. Her memory, attention, language and knowledge are appropriate. There is no aphasia, agnosia, apraxia or anomia. Speech is clear with normal prosody and enunciation. Thought process is linear. Mood is congruent and affect is normal.  Cranial nerves are as described above under HEENT exam. In addition, shoulder shrug is normal with equal shoulder height noted. Motor exam: Normal bulk, strength and tone is noted with the exception of a R foot drop. There is no drift, tremor or rebound. Romberg is positive. Reflexes are 1+ in the UEs, and absent in both knees and absent in the R ankle and trace in the L ankle. No fasciculations, no atrophy. Toes are downgoing bilaterally. Fine motor skills are intact with the exception of abnormal R foot taps.  Cerebellar testing shows no dysmetria or intention tremor on finger to nose testing. Heel to shin is unremarkable bilaterally. There is no truncal or gait ataxia.  Sensory exam is intact to light touch, pinprick, vibration, temperature sense in the  upper extremities, but mild decrease in temperature sense in the LEs up to the knees and decrease in PP and vibration sense in the R foot.  Gait, station and balance: she stands with mild difficulty and needs to push up. She stands wide based and has to use the walker. She does not pick up R foot well. No problems turning are noted.   Assessment and Plan:   In summary, LEEA RAMBEAU is a very pleasant 79 y.o.-year old female with a history of obesity, insomnia, degenerative arthritis, degenerative back disease  including spinal stenosis, hypertension, hyperlipidemia, vitamin D deficiency, and hearing loss, who presents with decrease sensation in the R foot and R foot drop. This may be the result of her recent fall when she injured her R foot and knee and she also has degenerative back disease as well. I think her presentation is primarily due to her orthopedic degenerative spine problems as well as knee problems but I suggest looking into other reasons for more generalized peripheral neuropathy. To that end, I will do blood work today and schedule her for EMG nerve conduction studies on both lower extremities as well as a lumbar spine MRI. No new medications are suggested and she is advised to continue with outpatient physical therapy and FU with you in that regard. We should be able to call her early next week with her blood test results. I have asked her daughter to watch for apneic pauses while the patient is asleep as she is obese and has a crowded looking airway. She is known to snore. I answered all their questions today and the patient and her daughter were in agreement with the above outlined plan. I would like to see the patient back in 3 months, sooner if the need arises and encouraged them to call with any interim questions, concerns, problems or updates or test results.   Thank you very much for allowing me to participate in the care of this nice patient. If I can be of any further assistance to  you please do not hesitate to call me at [phone].  Sincerely,   Huston Foley, MD, PhD

## 2013-11-06 LAB — ANA W/REFLEX: Anti Nuclear Antibody(ANA): POSITIVE — AB

## 2013-11-06 LAB — COMPREHENSIVE METABOLIC PANEL
ALT: 12 IU/L (ref 0–32)
AST: 18 IU/L (ref 0–40)
CO2: 26 mmol/L (ref 18–29)
Calcium: 10.2 mg/dL (ref 8.6–10.2)
Chloride: 102 mmol/L (ref 97–108)
Potassium: 4 mmol/L (ref 3.5–5.2)
Sodium: 143 mmol/L (ref 134–144)

## 2013-11-06 LAB — ENA+DNA/DS+SJORGEN'S
ENA RNP Ab: <0.2 AI (ref 0.0–0.9)
ENA SM Ab Ser-aCnc: <0.2 AI (ref 0.0–0.9)

## 2013-11-06 LAB — B12 AND FOLATE PANEL
Folate: 14.4 ng/mL (ref >3.0)
Vitamin B-12: 605 pg/mL (ref 211–946)

## 2013-11-06 LAB — CK: Total CK: 169 U/L (ref 24–173)

## 2013-11-06 LAB — RPR TITER: RPR Ser-Titr: 1:2 {titer} — ABNORMAL HIGH

## 2013-11-06 LAB — HGB A1C W/O EAG: Hgb A1c MFr Bld: 5.9 % — ABNORMAL HIGH (ref 4.8–5.6)

## 2013-11-06 LAB — C-REACTIVE PROTEIN: CRP: 7 mg/L — ABNORMAL HIGH (ref 0.0–4.9)

## 2013-11-06 LAB — TSH: TSH: 2.82 u[IU]/mL (ref 0.450–4.500)

## 2013-11-09 NOTE — Addendum Note (Signed)
Addended by: Huston Foley on: 11/09/2013 05:34 PM   Modules accepted: Orders

## 2013-11-09 NOTE — Progress Notes (Signed)
Quick Note:  Please advise patient or her husband that some of the inflammatory and infectious markers were positive in the blood. To that and I would like to do an additional blood test for which i placed the request. I would also like to see if she has been to see a rheumatologist. Some of her rheumatological markers are positive and I would like for her to see a rheumatologist unless she is already seeing one. Please inquire. Please also advised him to bring her back for additional blood work. Huston Foley, MD, PhD [facility] ([facility]  ______

## 2013-11-17 ENCOUNTER — Ambulatory Visit
Admission: RE | Admit: 2013-11-17 | Discharge: 2013-11-17 | Disposition: A | Discharge status: Home / Self Care | Payer: Medicare Other | Source: Ambulatory Visit | Attending: Neurology | Admitting: Neurology

## 2013-11-17 DIAGNOSIS — M216X9 Other acquired deformities of unspecified foot: Secondary | ICD-10-CM

## 2013-11-17 DIAGNOSIS — M21371 Foot drop, right foot: Secondary | ICD-10-CM

## 2013-11-17 DIAGNOSIS — G609 Hereditary and idiopathic neuropathy, unspecified: Secondary | ICD-10-CM

## 2013-11-17 DIAGNOSIS — G5730 Lesion of lateral popliteal nerve, unspecified lower limb: Secondary | ICD-10-CM

## 2013-11-17 DIAGNOSIS — R29818 Other symptoms and signs involving the nervous system: Secondary | ICD-10-CM

## 2013-11-17 DIAGNOSIS — G5731 Lesion of lateral popliteal nerve, right lower limb: Secondary | ICD-10-CM

## 2013-11-17 DIAGNOSIS — Z9289 Personal history of other medical treatment: Secondary | ICD-10-CM

## 2013-11-18 ENCOUNTER — Ambulatory Visit (INDEPENDENT_AMBULATORY_CARE_PROVIDER_SITE_OTHER): Payer: Medicare Other | Admitting: Diagnostic Neuroimaging

## 2013-11-18 ENCOUNTER — Encounter (INDEPENDENT_AMBULATORY_CARE_PROVIDER_SITE_OTHER): Payer: Self-pay

## 2013-11-18 ENCOUNTER — Other Ambulatory Visit: Payer: Self-pay | Admitting: Neurology

## 2013-11-18 ENCOUNTER — Telehealth: Payer: Self-pay | Admitting: Neurology

## 2013-11-18 DIAGNOSIS — G609 Hereditary and idiopathic neuropathy, unspecified: Secondary | ICD-10-CM

## 2013-11-18 DIAGNOSIS — Z0289 Encounter for other administrative examinations: Secondary | ICD-10-CM

## 2013-11-18 DIAGNOSIS — M21371 Foot drop, right foot: Secondary | ICD-10-CM

## 2013-11-18 DIAGNOSIS — G5731 Lesion of lateral popliteal nerve, right lower limb: Secondary | ICD-10-CM

## 2013-11-18 DIAGNOSIS — M48061 Spinal stenosis, lumbar region without neurogenic claudication: Secondary | ICD-10-CM

## 2013-11-18 DIAGNOSIS — Z9289 Personal history of other medical treatment: Secondary | ICD-10-CM

## 2013-11-18 DIAGNOSIS — R29898 Other symptoms and signs involving the musculoskeletal system: Secondary | ICD-10-CM

## 2013-11-18 DIAGNOSIS — R29818 Other symptoms and signs involving the nervous system: Secondary | ICD-10-CM

## 2013-11-18 NOTE — Telephone Encounter (Signed)
Patient stopped at checkout, wants someone to call with lab results.

## 2013-11-20 NOTE — Telephone Encounter (Signed)
I called pt and she has appt on 11-24-13 to go over results with Dr. Frances Furbish.

## 2013-11-23 ENCOUNTER — Telehealth: Payer: Self-pay | Admitting: Family Medicine

## 2013-11-23 DIAGNOSIS — M169 Osteoarthritis of hip, unspecified: Secondary | ICD-10-CM

## 2013-11-23 NOTE — Procedures (Signed)
   [facility]  NCS (NERVE CONDUCTION STUDY) WITH EMG (ELECTROMYOGRAPHY) REPORT   STUDY DATE: 11/18/13 PATIENT NAME: Nicole Perez DOB: [birth year] MRN: [id number]  ORDERING CLINICIAN: Huston Foley, MD PhD   TECHNOLOGIST: Gearldine Shown ELECTROMYOGRAPHER: Glenford Bayley. Penumalli, MD  CLINICAL INFORMATION: 79 year old female with bilateral (right worse than left) foot weakness.  FINDINGS: NERVE CONDUCTION STUDY: Right median motor response has normal distal latency, amplitude, conduction velocity and F-wave latency. Bilateral peroneal and left tibial motor responses have normal distal latencies, decreased amplitudes, normal conduction velocities and normal F-wave latencies. Right tibial motor response has prolonged distal latency, decreased amplitude, normal conduction velocity and normal F-wave latency. Bilateral H reflex responses with stimulation of the tibial nerves and recording over the soleus muscles could not be obtained. Right median sensory response is normal. Bilateral superficial peroneal sensory responses could not be obtained.  NEEDLE ELECTROMYOGRAPHY: Needle examination of selected muscles of the right lower extremity (vastus medialis, tibialis anterior, gastrocnemius) shows 1+ positive sharp waves and fibrillation potentials in the tibialis anterior and gastrocnemius muscles at rest and decreased motor unit recruitment on exertion. Vastus medialis is unremarkable.  Right L4-5 and bilateral L5-S1 muscles are unremarkable.  IMPRESSION:  Abnormal study demonstrating: 1. Length-dependent axonal sensorimotor polyneuropathy. 2. Absent H reflex responses raises possibility of concomitant S1 radiculopathies.    INTERPRETING PHYSICIAN:  Suanne Marker, MD Certified in Neurology, Neurophysiology and Neuroimaging  [facility] [address] [place], [place] [address] [phone]

## 2013-11-23 NOTE — Telephone Encounter (Signed)
Daughter called and needs refills on all her mothers medications. jw

## 2013-11-24 ENCOUNTER — Ambulatory Visit (INDEPENDENT_AMBULATORY_CARE_PROVIDER_SITE_OTHER): Payer: Medicare Other | Admitting: Neurology

## 2013-11-24 ENCOUNTER — Encounter: Payer: Self-pay | Admitting: Neurology

## 2013-11-24 VITALS — BP 160/86 | HR 76 | Temp 98.1°F | Ht 59.0 in | Wt 158.0 lb

## 2013-11-24 DIAGNOSIS — R52 Pain, unspecified: Secondary | ICD-10-CM

## 2013-11-24 DIAGNOSIS — M216X9 Other acquired deformities of unspecified foot: Secondary | ICD-10-CM

## 2013-11-24 DIAGNOSIS — Z9289 Personal history of other medical treatment: Secondary | ICD-10-CM

## 2013-11-24 DIAGNOSIS — M48061 Spinal stenosis, lumbar region without neurogenic claudication: Secondary | ICD-10-CM

## 2013-11-24 DIAGNOSIS — M549 Dorsalgia, unspecified: Secondary | ICD-10-CM

## 2013-11-24 DIAGNOSIS — Z9189 Other specified personal risk factors, not elsewhere classified: Secondary | ICD-10-CM

## 2013-11-24 DIAGNOSIS — G609 Hereditary and idiopathic neuropathy, unspecified: Secondary | ICD-10-CM

## 2013-11-24 DIAGNOSIS — M21371 Foot drop, right foot: Secondary | ICD-10-CM

## 2013-11-24 MED ORDER — GABAPENTIN 100 MG PO CAPS: 100.0000 mg | ORAL_CAPSULE | Freq: Every day | ORAL | Status: DC

## 2013-11-24 NOTE — Progress Notes (Signed)
Subjective:    Patient ID: Nicole Perez is a 79 y.o. female.  HPI   Interim history:   Nicole Perez is a very pleasant 79 year old right-handed woman with an underlying medical history of obesity, insomnia, degenerative arthritis, degenerative back disease including spinal stenosis, hypertension, hyperlipidemia, vitamin D deficiency, and hearing loss, who presents for follow-up consultation of her R leg numbness and R foot drop. She is accompanied by her 2 daughter in today. I first met her on 11/05/2013, which time I ordered blood work, EMG/nerve conduction studies on both lower extremities as well as a lumbar spine MRI.  She had L-spine MRI on 11/17/2013: Severe degenerative lumbar spine disease, notable for multi-level, severe spinal stenosis at L1-2, L2-3, L3-4. Also moderate-severe spinal stenosis at T12-L1 and multilevel foraminal stenosis as above. Compared to MRI on 10/18/09, there may been some mild progression of degenerative changes, but most of the findings are stable. I personally reviewed the films through the PACS system.  Her blood work showed a positive ANA with negative reflex testing, elevated CRP at 7, normal TSH, normal B12 and folate, borderline hemoglobin A1c, normal CK and a positive RPR. We called her with her test results and I requested that she come back for further testing, particularly FTA-ABS testing. She presents for a FU appointment to discuss her test results.  Her EMG/NCV test from 11/18/2013 showed: Length-dependent axonal sensorimotor polyneuropathy. Absent H reflex responses raises possibility of concomitant S1 radiculopathies.  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 She has some burning sensation in her feet, R>L. She has chronic  back pain. She quit going to PT, stating, she cannot afford it.  She has a history of back surgery about 20 years ago and has been undergoing physical therapy. She has a history of right foot weakness and decreased sensation in the right leg. She had EMG and nerve conduction studies in Dr. Greig Right office on 10/20/2013 which showed and decreased right peroneal nerve function and evidence of superimposed chronic lumbar radiculopathy affecting L4, L5 and potentially S1 on the right. She has severe right knee arthritis and is status post injection on 08/26/2013. I also reviewed her x-ray reports which includes right ankle x-ray from 08/18/2013 showing soft tissue swelling without underlying fracture and tibiotalar joint effusion. She had a right knee x-ray on 08/18/2013 which showed moderate tricompartmental degenerative changes, most prominent in the medial compartment.  She fell in September and twisted her R foot and has had numbness in her foot since then. She has been in PT and has been given a 2 wheeled walker. She has not fallen since then. She has trouble picking up her R foot.   Her Past Medical History Is Significant For: Past Medical History  Diagnosis Date  . Essential hypertension, benign 04/18/2010  . Lichen sclerosus et atrophicus of the vulva   . BACK PAIN, CHRONIC 04/18/2010  . DEGENERATIVE JOINT DISEASE, AXIAL SKELETON 04/21/2010  . DEGENERATIVE JOINT DISEASE, HIPS 09/11/2007  . INSOMNIA, CHRONIC 04/18/2010  . Parotid adenoma 1990, 2012    Right parotid, recurrent parotid pleimorphic adenoma.   Marland Kitchen Spinal stenosis of thoracic region 07/26/2011  . Spinal stenosis of lumbar region 07/26/2011  . Foraminal stenosis of lumbar region 07/26/2011  .  Foraminal stenosis of thoracic region 07/26/2011  . Osteoarthritis, multiple sites 08/11/2013  . COLONIC POLYPS, HX OF 12/17/2004    Annotation: polypectomy during colonoscopy in 2006 Qualifier: Diagnosis of  By: McDiarmid MD, Tawanna Cooler    . Cystocele 08/11/2013  .  Fall     recently x3    Her Past Surgical History Is Significant For: Past Surgical History  Procedure Laterality Date  . Total abdominal hysterectomy w/ bilateral salpingoophorectomy  1964    Hysterectomy and bilateral oopherectomy at age 42 for benign reasons  . Bladder suspension      Bladder tack x 2 (Dr Brunilda Payor)  . Cystocele repair      Rectal prolapse and cyctocele adter hysterectomy requiring anterior repair Judie Petit Leda Quail, MD)  . Rectocele repair      Rectal prolapse and cyctocele adter hysterectomy requiring anterior repair Judie Petit Leda Quail, MD)  . Urethral dilation    . Parotid gland tumor excision  1990    S/P excision of Right Parotid Gland Benign Tumor, 1990  . Reconstruction of nose  1994    Nasal bridge reconstruction (Dr Micki Riley, 1994) for following  Forklift accident on job. Surgery complicated by nerve damage resulting in difficulty raising right eyebrow   . Carpal tunnel release  2010    Carpel Tunnel Release of  left wrist  03/2009 (Dr Merlyn Lot): Nerve   . Cataract extraction w/ intraocular lens implant    . Breast lumpectomy      Lumpectomy of benign Breast lumps bilaterally, Dr Lurene Shadow  . Laminectomy      S/P L4-5 Laminectomy (1987) for decompression of spinal stenosis  . Colonoscopy w/ polypectomy  2006  . Parotidectomy  08/30/11    Narda Bonds, MD (ENT) for recurrent right parotid pleomorphic adenoma by frozen section  . Carpal tunnel release      right wrist    Her Family History Is Significant For: Family History  Problem Relation Age of Onset  . Coronary artery disease Mother   . Coronary artery disease Sister     open heart surgery  . Diabetes type II Sister   . Stroke Father 64  . Hypertension Father   . Lupus Brother   . Heart disease Brother   . Heart attack Sister     Her Social History Is Significant For: History   Social History  . Marital Status: Widowed    Spouse Name: N/A    Number of Children: N/A  . Years of Education: N/A    Occupational History  . retired    Social History Main Topics  . Smoking status: Former Games developer  . Smokeless tobacco: Never Used  . Alcohol Use: No  . Drug Use: No  . Sexual Activity: No     Comment: hysterectomy   Other Topics Concern  . None   Social History Narrative   Has 8 children   [name], [name], involved in her care   4 brothers, 5 sisters, 3 deceased siblings   Cares for her great-grandchildren during the day   Widowed in 2009   Lives alone.   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              Her Allergies Are:  No Known Allergies:  Her Current Medications Are:  Outpatient Encounter Prescriptions as of 11/24/2013  Medication Sig  . diltiazem (DILACOR XR) 240 MG 24 hr capsule Take 1 capsule (240 mg total) by mouth daily.  . hydrochlorothiazide (MICROZIDE) 12.5 MG capsule Take 1 capsule (12.5 mg total) by mouth daily.  Marland Kitchen HYDROcodone-acetaminophen (HYCET) 7.5-325 mg/15 ml solution Take 15 mLs by mouth 4 (four) times daily as needed for pain.  . magnesium hydroxide (MILK OF MAGNESIA) 400 MG/5ML suspension Take by mouth as needed.    . meloxicam (MOBIC) 15 MG tablet 15 mg daily.  Marland Kitchen albuterol (PROVENTIL HFA;VENTOLIN HFA) 108 (90 BASE) MCG/ACT inhaler Inhale 2 puffs into the lungs every 6 (six) hours as needed for wheezing.  . gabapentin (NEURONTIN) 100 MG capsule Take 1 capsule (100 mg total) by mouth at bedtime.   Review of Systems:  Out of a complete 14 point review of systems, all are reviewed and negative with the exception of these symptoms as listed below:   Review of Systems  Constitutional: Negative.   HENT: Positive for hearing loss.   Eyes: Negative.   Respiratory: Negative.   Cardiovascular: Negative.   Gastrointestinal: Positive for constipation.  Endocrine: Negative.    Genitourinary: Negative.   Musculoskeletal: Positive for gait problem, joint swelling and myalgias.  Allergic/Immunologic: Negative.   Neurological: Negative.   Hematological: Negative.   Psychiatric/Behavioral: Negative.     Objective:  Neurologic Exam  Physical Exam Physical Examination:   Filed Vitals:   11/24/13 1144  BP: 160/86  Pulse: 76  Temp: 98.1 F (36.7 C)    General Examination: The patient is a very pleasant 80 y.o. female in no acute distress. She appears well-developed and well-nourished and well groomed. She is obese.  HEENT: Normocephalic, atraumatic, pupils are equal, round and reactive to light and accommodation. Funduscopic exam is unremarkable. Extraocular tracking is good without limitation to gaze excursion or nystagmus noted. Normal smooth pursuit is noted. Hearing is grossly intact. Face is symmetric with normal facial animation and normal facial sensation. Speech is clear with no dysarthria noted. There is no hypophonia. There is no lip, neck/head, jaw or voice tremor. Neck is supple with full range of passive and active motion. There are no carotid bruits on auscultation. Oropharynx exam reveals: mild mouth dryness, adequate dental hygiene and moderate airway crowding, due to large tongue and redundant soft palate. Mallampati is class II. Tongue protrudes centrally and palate elevates symmetrically. Tonsils are small.   Chest: Clear to auscultation without wheezing, rhonchi or crackles noted.  Heart: S1+S2+0, regular and normal without murmurs, rubs or gallops noted.   Abdomen: Soft, non-tender and non-distended with normal bowel sounds appreciated on auscultation.  Extremities: There is no pitting edema in the distal lower extremities bilaterally. Pedal pulses are intact.  Skin: Warm and dry without trophic changes noted. There are no varicose veins.  Musculoskeletal: exam reveals no obvious joint deformities, tenderness or joint swelling or erythema.    Neurologically:  Mental status: The patient is awake, alert and oriented in all 4 spheres. Her memory, attention, language and knowledge are appropriate. There is no aphasia, agnosia, apraxia or anomia. Speech is clear with normal prosody and enunciation. Thought process is linear. Mood is congruent and affect is normal.  Cranial nerves are as described above under HEENT exam. In addition, shoulder shrug is normal with equal shoulder height noted. Motor exam: Normal bulk, strength and tone is noted with the exception of a mild R foot drop. There is no drift, tremor  or rebound. Romberg is positive. Reflexes are 1+ in the UEs, and absent in both knees and absent in the R ankle and trace in the L ankle. No fasciculations, no atrophy. Toes are downgoing bilaterally. Fine motor skills are intact with the exception of abnormal R foot taps.  Cerebellar testing shows no dysmetria or intention tremor on finger to nose testing. Heel to shin is unremarkable bilaterally. There is no truncal or gait ataxia.  Sensory exam is intact to light touch, pinprick, vibration, temperature sense in the upper extremities, but mild decrease in temperature sense in the LEs up to the knees and decrease in PP and vibration sense in the R foot. She reports mild burning foot pain right more than left. Gait, station and balance: she stands with mild difficulty and needs to push up. She stands wide based and has to use the walker. She does not pick up R foot well. No problems turning are noted.   Assessment and Plan:   In summary, Nicole Perez is a very pleasant 79 year old female with a history of obesity, insomnia, degenerative arthritis, degenerative back disease including spinal stenosis, hypertension, hyperlipidemia, vitamin D deficiency, and hearing loss, who presents with decrease sensation in the R foot and R foot drop. Her workup thus far reveals axonal peripheral neuropathy, blood work revealed positive ANA with negative  reflex testing, positive RPR and she endorses being treated for a sexually transmitted disease in the past but is not sure what she was treated with him for what. I would like to proceed with additional testing in the form of blood work with FTA Abs, heavy metal testing and protein electrophoresis. I think most of her gait difficulty and back pain is a result from her severe degenerative back disease and I've advised her to followup with her orthopedist for that. She is advised to follow his instructions regarding physical therapy and also make a followup appointment with Dr. Eulah Pont. For her burning foot pain, I suggested a trial of low-dose gabapentin. She is to start with 100 mg at night only. I talked to him about potential side effects. We should be able to call her with her test results. I would like to see her back in 3 months. Her lumbar spine MRI confirmed severe degenerative spine disease including severe lumbar spinal stenosis which has progressed some since 2010.  I answered all their questions today and the patient and her daughters were in agreement with the above outlined plan. I would like to see the patient back in 3 months, sooner if the need arises and encouraged them to call with any interim questions, concerns, problems or updates or test results. I provided her with a new prescription for gabapentin 100 mg once nightly. Most of my 25 minute visit today was spent in counseling and coordination of care, reviewing test results and reviewing medication.

## 2013-11-24 NOTE — Telephone Encounter (Signed)
No answer and no machine.  Will await callback.  Please ask pt which meds she needs specifically. Nicole Perez, [name]

## 2013-11-24 NOTE — Patient Instructions (Signed)
Please continue with PT for your back.  Please see Dr. Eulah Pont back for your back pain.  For your neuropathy I suggest a trial of Start Neurontin (gabapentin) 100 mg strength: Take 1 pill nightly at bedtime. The most common side effects reported are sedation or sleepiness. Rare side effects include balance problems, confusion.  We will do additioonal blood work today and call you back.

## 2013-11-24 NOTE — Telephone Encounter (Signed)
Patient has refills on her Diltiazem until March 2015 and her HCTZ until august 2015.  Please ask daughter what medication(s) specifically need refill for Ms Nicole Perez?

## 2013-11-26 LAB — IFE AND PE, SERUM
Albumin SerPl Elph-Mcnc: 4 g/dL (ref 3.2–5.6)
Alpha 1: 0.2 g/dL (ref 0.1–0.4)
Alpha2 Glob SerPl Elph-Mcnc: 0.8 g/dL (ref 0.4–1.2)
Globulin, Total: 2.9 g/dL (ref 2.0–4.5)
IgA/Immunoglobulin A, Serum: 151 mg/dL (ref 91–414)
IgM (Immunoglobulin M), Srm: 79 mg/dL (ref 40–230)
Total Protein: 6.9 g/dL (ref 6.0–8.5)

## 2013-11-26 LAB — HEAVY METALS PROFILE II, BLOOD: Cadmium: 0.7 ug/L (ref 0.0–1.2)

## 2013-11-26 NOTE — Progress Notes (Signed)
Quick Note:  Please advise patient that her blood work was normal. This includes heavy metal screen of her blood and protein breakdown of her blood. No further action is required on this test at this time. Huston Foley, MD, PhD [facility]  ______

## 2013-11-26 NOTE — Progress Notes (Signed)
Quick Note:  Findings reviewed on PACS and also discussed with patient and her 2 daughters during recent visit. No action required.  Huston Foley, MD, PhD [facility]  ______

## 2013-11-27 NOTE — Progress Notes (Signed)
Quick Note:  Shared normal blood work message per Dr Teofilo Pod findings, patient verbalized understanding ______

## 2013-11-27 NOTE — Telephone Encounter (Signed)
Daughter calls back. Patient needs refill on Hydrocodone. Down to 2 pills.

## 2013-11-30 NOTE — Telephone Encounter (Signed)
Daughter calls again. Please call her [name] at [phone]

## 2013-12-01 MED ORDER — HYDROCODONE-ACETAMINOPHEN 7.5-325 MG PO TABS: 1.0000 | ORAL_TABLET | Freq: Four times a day (QID) | ORAL | Status: DC | PRN

## 2013-12-01 NOTE — Telephone Encounter (Signed)
Please contact patient's daughter to pick up patient's  hydrocodone-acetaminophen prescriptions.

## 2013-12-01 NOTE — Telephone Encounter (Signed)
LM with daughter making her aware that this was taken care of. Burnard Hawthorne

## 2013-12-04 ENCOUNTER — Telehealth: Payer: Self-pay | Admitting: Neurology

## 2013-12-04 NOTE — Telephone Encounter (Signed)
Patient said that since she has had EMG/NCV, hips have been hurting and burning, what should she do?

## 2013-12-04 NOTE — Telephone Encounter (Signed)
Called patient for more information concerning pain, lt VM message

## 2013-12-04 NOTE — Telephone Encounter (Signed)
I tried calling the listed number and I could not talk to the patient. I got a voicemail. I left a generic response stating that it was Dr. Frances Perez calling from [facility] regarding her message from this morning. Please tried calling patient again and explained to her that it is unusual for the EMG and nerve conduction test to cause hurting and burning. Unfortunately she has neuropathy which can cause the symptoms but also significant spine degenerative disease which can also cause pain in the lower extremities. At the last visit she was advised to make an appointment with her back Dr. I tried her on a new medication called gabapentin 100 mg each night. If she is tolerating this she can start taking 2 pills at night namely 200 mg each night to help with her burning pain.

## 2013-12-04 NOTE — Telephone Encounter (Signed)
Informed patient per Dr Teofilo Pod telephone note, patient verbalized understanding.

## 2013-12-07 ENCOUNTER — Ambulatory Visit (HOSPITAL_COMMUNITY)
Admission: RE | Admit: 2013-12-07 | Discharge: 2013-12-07 | Disposition: A | Discharge status: Home / Self Care | Payer: Medicare Other | Source: Ambulatory Visit | Attending: Family Medicine | Admitting: Family Medicine

## 2013-12-07 DIAGNOSIS — Z1231 Encounter for screening mammogram for malignant neoplasm of breast: Secondary | ICD-10-CM | POA: Insufficient documentation

## 2013-12-16 ENCOUNTER — Encounter: Payer: Self-pay | Admitting: Family Medicine

## 2013-12-16 ENCOUNTER — Ambulatory Visit (INDEPENDENT_AMBULATORY_CARE_PROVIDER_SITE_OTHER): Payer: Medicare Other | Admitting: Family Medicine

## 2013-12-16 VITALS — BP 160/72 | HR 88 | Temp 97.8°F | Ht 59.0 in | Wt 157.0 lb

## 2013-12-16 DIAGNOSIS — K602 Anal fissure, unspecified: Secondary | ICD-10-CM | POA: Insufficient documentation

## 2013-12-16 DIAGNOSIS — R300 Dysuria: Secondary | ICD-10-CM

## 2013-12-16 DIAGNOSIS — K5900 Constipation, unspecified: Secondary | ICD-10-CM

## 2013-12-16 HISTORY — DX: Anal fissure, unspecified: K60.2

## 2013-12-16 LAB — POCT URINALYSIS DIPSTICK
Bilirubin, UA: NEGATIVE
Blood, UA: NEGATIVE
Glucose, UA: NEGATIVE
Ketones, UA: NEGATIVE
pH, UA: 6.5

## 2013-12-16 LAB — POCT UA - MICROSCOPIC ONLY

## 2013-12-16 MED ORDER — POLYETHYLENE GLYCOL 3350 17 GM/SCOOP PO POWD: 17.0000 g | Freq: Every day | ORAL | Status: DC

## 2013-12-16 NOTE — Assessment & Plan Note (Signed)
Pain likely from fissure present on posterior rectal vault. Plan to change from milk of magnesia to miralax, with goal of 1 BM a day. Can continue OTC preparation H and sitz baths. ROI form signed to try and get report from last colonoscopy (per chart review it was done in 2006 with polyps removed). No major warning signs of colon cancer, weight is fairly stable but has decreased 10lbs since May.

## 2013-12-16 NOTE — Progress Notes (Signed)
   Subjective:    Patient ID: Nicole Perez, female    DOB: 1933/08/01, 79 y.o.   MRN: [id number]  HPI  CC: rectal pain  Nicole Perez is a 79 y.o. female here today for evaluation of rectal pain. She says the pain started about 2 weeks ago. Describes the pain as raw/burning/stinging, and she has a sensation of feeling a bump on the inside of her rectum. She has not noticed any blood in the stool, but had 1 episode of dark colored stool a week ago. She denies having constipation recently since she has been consistently using milk of magnesia once a week. She also says that the burning sometimes occurs with urination.   She does not remember who or when she last had a colonoscopy.  Review of Systems Denies weight loss, endorses feeling tired recently. No fevers or chills. No diarrhea.    Objective:   Physical Exam BP 160/72  Pulse 88  Temp(Src) 97.8 F (36.6 C) (Oral)  Ht 4\' 11"  (1.499 m)  Wt 157 lb (71.215 kg)  BMI 31.69 kg/m2  LMP 12/17/1962  General: NAD CV: RRR, normal heart sounds Resp: CTAB, effort normal GI: Rectum: exterior without any obvious irritation, small external hemorrhoid present DRE: no palpable masses, no blood on withdrawal. Anoscopy: posterior fissure approx 1.5cm long, 1 small thrombosed internal hemorrhoid FOBT card was positive for blood GU: external genitalia appears mildly irritated (has hx of lichen sclerosus), but no discharge.     Assessment & Plan:  See Problem List documentation

## 2013-12-16 NOTE — Addendum Note (Signed)
Addended by: [place], Sameerah Nachtigal on: 12/16/2013 04:04 PM   Modules accepted: Orders

## 2013-12-16 NOTE — Patient Instructions (Addendum)
From your exam today you have some hemorrhoids and a fissure in your rectum, which means there is an area of small tear on the inside. The fissure is what is likely causing your pain. The best treatment for this is to let your body heal itself, and for you to make your bowel movements regular with a goal of 1 per day. I prescribed you miralax, which you should use by doing the following:  Miralax instructions Take 1 packet/capful a day. Increase by 1 packet/capful a day until you are having 1 bowel movement a day. If you start to have more than 1 bowel movement, decrease by 1 packet a day.  Signs and symptoms to be aware of and call the clinic sooner: bloody or very dark stool, fevers/chills, weight loss.   We will try and find out where you had your last colonoscopy in order to see what their recommendations were at that time. Please follow up with Dr. McDiarmid in the 2-3 weeks.

## 2013-12-22 ENCOUNTER — Telehealth: Payer: Self-pay | Admitting: Family Medicine

## 2013-12-22 NOTE — Telephone Encounter (Signed)
Patient would like results from urinalysis taken last week.

## 2013-12-23 NOTE — Telephone Encounter (Signed)
Pt is calling back for her lab results. jw

## 2013-12-23 NOTE — Telephone Encounter (Signed)
Can you call the patient back and let them know the urine results were normal? If she is having any symptoms of UTI she should come back to get it repeated. Thanks. [name]

## 2013-12-23 NOTE — Telephone Encounter (Signed)
Pt is aware of results.  States that she is still having rectal pain and burning.  Is there anything you can call in for this?  Nicole Perez,CMA

## 2013-12-24 ENCOUNTER — Telehealth: Payer: Self-pay | Admitting: Family Medicine

## 2013-12-24 MED ORDER — LIDOCAINE (ANORECTAL) 5 % EX CREA: 1.0000 "application " | TOPICAL_CREAM | Freq: Three times a day (TID) | CUTANEOUS | Status: DC

## 2013-12-24 NOTE — Telephone Encounter (Signed)
Pt is aware of this. Nicole Perez,CMA  

## 2013-12-24 NOTE — Telephone Encounter (Signed)
Will forward to PCP and Dr. Erin Hearing since he cover's Dr. McDiarmid's box while he is in school. Dacie Mandel,CMA

## 2013-12-24 NOTE — Telephone Encounter (Signed)
Can you call her and let her know I sent in a prescription for lidocaine cream that she can use 3 times a day to numb the area. She should use a pea size amount. The best treatment for her is what we discussed in the AVS, take the miralax with a goal of 1 BM a day, she should adjust how much miralax she is taking by 1 capful/packet a day until she achieves that. Thanks. [name]

## 2013-12-24 NOTE — Telephone Encounter (Signed)
Daughter called and needs her mother's medication for hydrocodone changed from 60 qty to 124 qty per month. She said that the instructions say 1 every 6 hours and the bottle only has 60 so they are already out of this month's bottle that was filled on the 16 th of December. They remaining two prescriptions for January and February will also have to be changed. Please do as soon as you can. jw

## 2013-12-24 NOTE — Addendum Note (Signed)
Addended by: Leone Brand on: 12/24/2013 04:02 PM   Modules accepted: Orders

## 2013-12-25 NOTE — Telephone Encounter (Signed)
i will not be covering for dr m until i return on 1-26  Please discuss w  preceptor

## 2013-12-25 NOTE — Telephone Encounter (Signed)
Jazmine, I am not precepting today,please have patient come in to see a provider. May see residents.

## 2013-12-25 NOTE — Telephone Encounter (Signed)
Will forward to Dr. Eniola. Sheva Mcdougle,CMA  

## 2013-12-27 ENCOUNTER — Emergency Department (HOSPITAL_COMMUNITY)
Admission: EM | Admit: 2013-12-27 | Discharge: 2013-12-27 | Disposition: A | Discharge status: Home / Self Care | Payer: Medicare Other | Attending: Emergency Medicine | Admitting: Emergency Medicine

## 2013-12-27 ENCOUNTER — Encounter (HOSPITAL_COMMUNITY): Payer: Self-pay | Admitting: Emergency Medicine

## 2013-12-27 ENCOUNTER — Emergency Department (HOSPITAL_COMMUNITY): Payer: Medicare Other

## 2013-12-27 DIAGNOSIS — M4800 Spinal stenosis, site unspecified: Secondary | ICD-10-CM | POA: Insufficient documentation

## 2013-12-27 DIAGNOSIS — Z860100 Personal history of colon polyps, unspecified: Secondary | ICD-10-CM | POA: Insufficient documentation

## 2013-12-27 DIAGNOSIS — R5381 Other malaise: Secondary | ICD-10-CM | POA: Insufficient documentation

## 2013-12-27 DIAGNOSIS — Z87891 Personal history of nicotine dependence: Secondary | ICD-10-CM | POA: Insufficient documentation

## 2013-12-27 DIAGNOSIS — G5791 Unspecified mononeuropathy of right lower limb: Secondary | ICD-10-CM

## 2013-12-27 DIAGNOSIS — R319 Hematuria, unspecified: Secondary | ICD-10-CM | POA: Insufficient documentation

## 2013-12-27 DIAGNOSIS — Z79899 Other long term (current) drug therapy: Secondary | ICD-10-CM | POA: Insufficient documentation

## 2013-12-27 DIAGNOSIS — K623 Rectal prolapse: Secondary | ICD-10-CM | POA: Insufficient documentation

## 2013-12-27 DIAGNOSIS — Z043 Encounter for examination and observation following other accident: Secondary | ICD-10-CM | POA: Insufficient documentation

## 2013-12-27 DIAGNOSIS — M159 Polyosteoarthritis, unspecified: Secondary | ICD-10-CM | POA: Insufficient documentation

## 2013-12-27 DIAGNOSIS — R296 Repeated falls: Secondary | ICD-10-CM | POA: Insufficient documentation

## 2013-12-27 DIAGNOSIS — Z8742 Personal history of other diseases of the female genital tract: Secondary | ICD-10-CM | POA: Insufficient documentation

## 2013-12-27 DIAGNOSIS — R5383 Other fatigue: Secondary | ICD-10-CM

## 2013-12-27 DIAGNOSIS — R209 Unspecified disturbances of skin sensation: Secondary | ICD-10-CM | POA: Insufficient documentation

## 2013-12-27 DIAGNOSIS — Y929 Unspecified place or not applicable: Secondary | ICD-10-CM | POA: Insufficient documentation

## 2013-12-27 DIAGNOSIS — Y939 Activity, unspecified: Secondary | ICD-10-CM | POA: Insufficient documentation

## 2013-12-27 DIAGNOSIS — G5790 Unspecified mononeuropathy of unspecified lower limb: Secondary | ICD-10-CM | POA: Insufficient documentation

## 2013-12-27 DIAGNOSIS — Z862 Personal history of diseases of the blood and blood-forming organs and certain disorders involving the immune mechanism: Secondary | ICD-10-CM | POA: Insufficient documentation

## 2013-12-27 DIAGNOSIS — K8020 Calculus of gallbladder without cholecystitis without obstruction: Secondary | ICD-10-CM | POA: Insufficient documentation

## 2013-12-27 DIAGNOSIS — Z872 Personal history of diseases of the skin and subcutaneous tissue: Secondary | ICD-10-CM | POA: Insufficient documentation

## 2013-12-27 DIAGNOSIS — Z8639 Personal history of other endocrine, nutritional and metabolic disease: Secondary | ICD-10-CM | POA: Insufficient documentation

## 2013-12-27 DIAGNOSIS — M48061 Spinal stenosis, lumbar region without neurogenic claudication: Secondary | ICD-10-CM

## 2013-12-27 DIAGNOSIS — G8929 Other chronic pain: Secondary | ICD-10-CM | POA: Insufficient documentation

## 2013-12-27 DIAGNOSIS — I10 Essential (primary) hypertension: Secondary | ICD-10-CM

## 2013-12-27 LAB — CBC
HEMATOCRIT: 35.2 % — AB (ref 36.0–46.0)
HEMOGLOBIN: 12.2 g/dL (ref 12.0–15.0)
MCH: 29.7 pg (ref 26.0–34.0)
MCHC: 34.7 g/dL (ref 30.0–36.0)
MCV: 85.6 fL (ref 78.0–100.0)
Platelets: 160 10*3/uL (ref 150–400)
RBC: 4.11 MIL/uL (ref 3.87–5.11)
RDW: 12.3 % (ref 11.5–15.5)
WBC: 4.6 10*3/uL (ref 4.0–10.5)

## 2013-12-27 LAB — BASIC METABOLIC PANEL
BUN: 13 mg/dL (ref 6–23)
CHLORIDE: 105 meq/L (ref 96–112)
CO2: 25 meq/L (ref 19–32)
Calcium: 9 mg/dL (ref 8.4–10.5)
Creatinine, Ser: 0.66 mg/dL (ref 0.50–1.10)
GFR calc Af Amer: >90 mL/min (ref >90)
GFR, EST NON AFRICAN AMERICAN: 82 mL/min — AB (ref >90)
GLUCOSE: 83 mg/dL (ref 70–99)
POTASSIUM: 3.7 meq/L (ref 3.7–5.3)
SODIUM: 141 meq/L (ref 137–147)

## 2013-12-27 LAB — URINALYSIS, ROUTINE W REFLEX MICROSCOPIC
Bilirubin Urine: NEGATIVE
GLUCOSE, UA: NEGATIVE mg/dL
Ketones, ur: NEGATIVE mg/dL
LEUKOCYTES UA: NEGATIVE
Nitrite: NEGATIVE
PH: 6.5 (ref 5.0–8.0)
Protein, ur: NEGATIVE mg/dL
SPECIFIC GRAVITY, URINE: 1.01 (ref 1.005–1.030)
Urobilinogen, UA: 0.2 mg/dL (ref 0.0–1.0)

## 2013-12-27 LAB — URINE MICROSCOPIC-ADD ON

## 2013-12-27 MED ORDER — HYDROCODONE-ACETAMINOPHEN 5-325 MG PO TABS: 1.0000 | ORAL_TABLET | ORAL | Status: DC | PRN

## 2013-12-27 MED ORDER — HYDROCODONE-ACETAMINOPHEN 5-325 MG PO TABS
1.0000 | ORAL_TABLET | Freq: Once | ORAL | Status: AC
  Administered 2013-12-27: 1 via ORAL
  Filled 2013-12-27: qty 1

## 2013-12-27 MED ORDER — IOHEXOL 300 MG/ML  SOLN
50.0000 mL | Freq: Once | INTRAMUSCULAR | Status: AC | PRN
  Administered 2013-12-27: 50 mL via ORAL

## 2013-12-27 MED ORDER — IOHEXOL 300 MG/ML  SOLN
100.0000 mL | Freq: Once | INTRAMUSCULAR | Status: AC | PRN
  Administered 2013-12-27: 100 mL via INTRAVENOUS

## 2013-12-27 MED ORDER — SODIUM CHLORIDE 0.9 % IV BOLUS (SEPSIS)
500.0000 mL | Freq: Once | INTRAVENOUS | Status: AC
  Administered 2013-12-27: 500 mL via INTRAVENOUS

## 2013-12-27 NOTE — ED Notes (Signed)
Pt had a fall on the Sunday before labor day.  C/o rt leg pain and rectal pain since.  Has been consistently seeing orthopedists and other doctors for the pain.  Has already had an MRI.  States that her "rectum is bulging".

## 2013-12-27 NOTE — ED Provider Notes (Addendum)
CSN: [id number]     Arrival date & time 12/27/13  1125 History   First MD Initiated Contact with Patient 12/27/13 1153     Chief Complaint  Patient presents with  . Fall  . Rectal Pain   (Consider location/radiation/quality/duration/timing/severity/associated sxs/prior Treatment) HPI Comments: 80 yo female with spinal stenosis, chronic back pain, arthritis, htn presents with rectal pressure, stool changes and right lower extremity weakness.  Pt has had a workup for foot drop issues, MRI/ has seen neuro and boot is ordered; she has close fup, she has not back pain or fevers in ED and this issue has been going on for months.  The past few weeks she has had sensation of pressure in rectum, worse with defecation.  She can hold stools in, worse with liquid.  No bleeding.   Patient is a 80 y.o. female presenting with fall. The history is provided by the patient.  Fall Pertinent negatives include no chest pain, no abdominal pain, no headaches and no shortness of breath.    Past Medical History  Diagnosis Date  . Essential hypertension, benign 04/18/2010  . Lichen sclerosus et atrophicus of the vulva   . BACK PAIN, CHRONIC 04/18/2010  . DEGENERATIVE JOINT DISEASE, AXIAL SKELETON 04/21/2010  . DEGENERATIVE JOINT DISEASE, HIPS 09/11/2007  . INSOMNIA, CHRONIC 04/18/2010  . Parotid adenoma 1990, 2012    Right parotid, recurrent parotid pleimorphic adenoma.   Marland Kitchen Spinal stenosis of thoracic region 07/26/2011  . Spinal stenosis of lumbar region 07/26/2011  . Foraminal stenosis of lumbar region 07/26/2011  . Foraminal stenosis of thoracic region 07/26/2011  . Osteoarthritis, multiple sites 08/11/2013  . COLONIC POLYPS, HX OF 12/17/2004    Annotation: polypectomy during colonoscopy in 2006 Qualifier: Diagnosis of  By: McDiarmid MD, Sherren Mocha    . Cystocele 08/11/2013  . Fall     recently x3   Past Surgical History  Procedure Laterality Date  . Total abdominal hysterectomy w/ bilateral salpingoophorectomy  1964     Hysterectomy and bilateral oopherectomy at age 69 for benign reasons  . Bladder suspension      Bladder tack x 2 (Dr Janice Norrie)  . Cystocele repair      Rectal prolapse and cyctocele adter hysterectomy requiring anterior repair Jerilynn Mages Edwinna Areola, MD)  . Rectocele repair      Rectal prolapse and cyctocele adter hysterectomy requiring anterior repair Jerilynn Mages Edwinna Areola, MD)  . Urethral dilation    . Parotid gland tumor excision  1990    S/P excision of Right Parotid Gland Benign Tumor, 1990  . Reconstruction of nose  1994    Nasal bridge reconstruction (Dr Judie Grieve, 1994) for following  Forklift accident on job. Surgery complicated by nerve damage resulting in difficulty raising right eyebrow   . Carpal tunnel release  2010    Carpel Tunnel Release of  left wrist  03/2009 (Dr Fredna Dow): Nerve   . Cataract extraction w/ intraocular lens implant    . Breast lumpectomy      Lumpectomy of benign Breast lumps bilaterally, Dr Bubba Camp  . Laminectomy      S/P L4-5 Laminectomy (1987) for decompression of spinal stenosis  . Colonoscopy w/ polypectomy  2006  . Parotidectomy  08/30/11    Radene Journey, MD (ENT) for recurrent right parotid pleomorphic adenoma by frozen section  . Carpal tunnel release      right wrist   Family History  Problem Relation Age of Onset  . Coronary artery disease Mother   . Coronary artery  disease Sister     open heart surgery  . Diabetes type II Sister   . Stroke Father 26  . Hypertension Father   . Lupus Brother   . Heart disease Brother   . Heart attack Sister    History  Substance Use Topics  . Smoking status: Former Research scientist (life sciences)  . Smokeless tobacco: Never Used  . Alcohol Use: No   OB History   Grav Para Term Preterm Abortions TAB SAB Ect Mult Living   8 8        7      Obstetric Comments   One daughter passed away one year ago All vaginal deliveries     Review of Systems  Constitutional: Negative for fever and chills.  HENT: Negative for congestion.   Eyes:  Negative for visual disturbance.  Respiratory: Negative for shortness of breath.   Cardiovascular: Negative for chest pain.  Gastrointestinal: Negative for vomiting and abdominal pain.  Genitourinary: Negative for dysuria and flank pain.  Musculoskeletal: Negative for back pain, neck pain and neck stiffness.  Skin: Negative for rash.  Neurological: Positive for weakness and numbness. Negative for light-headedness and headaches.    Allergies  Review of patient's allergies indicates no known allergies.  Home Medications   Current Outpatient Rx  Name  Route  Sig  Dispense  Refill  . diltiazem (DILACOR XR) 240 MG 24 hr capsule   Oral   Take 240 mg by mouth daily.         . hydrochlorothiazide (MICROZIDE) 12.5 MG capsule   Oral   Take 1 capsule (12.5 mg total) by mouth daily.   30 capsule   PRN   . HYDROcodone-acetaminophen (NORCO) 7.5-325 MG per tablet   Oral   Take 1 tablet by mouth every 6 (six) hours as needed for moderate pain. Fill 30 days after date of prescription         . polyethylene glycol powder (GLYCOLAX/MIRALAX) powder   Oral   Take 17 g by mouth daily. Increase by 1 packet a day until you are having 1 bowel movement a day.   3350 g   1    BP 160/71  Pulse 68  Temp(Src) 98.5 F (36.9 C) (Oral)  Resp 14  SpO2 99%  LMP 12/17/1962 Physical Exam  Nursing note and vitals reviewed. Constitutional: She is oriented to person, place, and time. She appears well-developed and well-nourished.  HENT:  Head: Normocephalic and atraumatic.  Eyes: Conjunctivae are normal. Right eye exhibits no discharge. Left eye exhibits no discharge.  Neck: Normal range of motion. Neck supple. No tracheal deviation present.  Cardiovascular: Normal rate and regular rhythm.   Pulmonary/Chest: Effort normal and breath sounds normal.  Abdominal: Soft. She exhibits no distension. There is no tenderness. There is no guarding.  Genitourinary:  No blood in stool, good rectal tone No  protrusion noted, mild fullness with palpation toward bladder  Musculoskeletal: She exhibits no edema.  Neurological: She is alert and oriented to person, place, and time.  Reflex Scores:      Patellar reflexes are 1+ on the right side and 1+ on the left side.      Achilles reflexes are 1+ on the right side and 1+ on the left side. Nl strength and sensation to sharp in LE bilateral at all major joints except 4/5 on right ankle and great toe to dorsiflexion  Skin: Skin is warm. No rash noted.  Psychiatric: She has a normal mood and affect.  ED Course  Procedures (including critical care time) Labs Review Labs Reviewed  URINALYSIS, ROUTINE W REFLEX MICROSCOPIC - Abnormal; Notable for the following:    Hgb urine dipstick LARGE (*)    All other components within normal limits  CBC - Abnormal; Notable for the following:    HCT 35.2 (*)    All other components within normal limits  BASIC METABOLIC PANEL - Abnormal; Notable for the following:    GFR calc non Af Amer 82 (*)    All other components within normal limits  URINE MICROSCOPIC-ADD ON   Imaging Review Ct Abdomen Pelvis W Contrast  12/27/2013   CLINICAL DATA:  Rectal bulge and pain  EXAM: CT ABDOMEN AND PELVIS WITH CONTRAST  TECHNIQUE: Multidetector CT imaging of the abdomen and pelvis was performed using the standard protocol following bolus administration of intravenous contrast.  CONTRAST:  18mL OMNIPAQUE IOHEXOL 300 MG/ML SOLN, 150mL OMNIPAQUE IOHEXOL 300 MG/ML SOLN  COMPARISON:  None.  FINDINGS: The lung bases are free of acute infiltrate or sizable effusion.  The gallbladder demonstrates multiple gas-filled stones. Very mild biliary ductal dilatation is noted within the liver. No hepatic mass is seen. The spleen, adrenal glands and pancreas are within normal limits. The kidneys are well visualized bilaterally and demonstrate bilateral cystic change. No renal calculi or obstructive changes are seen.  The bladder is significantly  distended. The appendix is not well visualized. No inflammatory changes are seen. The uterus has been surgically removed. Diverticulosis without diverticulitis is noted. There are some changes suggestive of rectal prolapse on the sagittal images. No definitive mass is noted. Comparison with the physical exam is recommended. The osseous structures demonstrate significant degenerative changes of the lumbar spine. Multiple vertebral hemangiomas are seen.  IMPRESSION: Cholelithiasis without complicating factors. Mild biliary ductal dilatation is noted in the intrahepatic biliary radicles although node common bile duct dilatation is noted.  Significant distension of the bladder of uncertain significance.  Changes suggestive of rectal prolapse on the sagittal images. Correlation with the physical exam is recommended.   Electronically Signed   By: Inez Catalina M.D.   On: 12/27/2013 15:40    EKG Interpretation   None       MDM   1. Rectal prolapse   2. Cholelithiasis   3. Neuropathy, leg, right    Right dorsiflexion weakness- chronic, pt has fup, no new issues, no back pain, pt has had MRI lumbar. Pt has known spinal stenosis, MRI reviewed, pt has seen ortho and fup discussed.  Rectal pressure, CT abd pelvis shows rectal prolapse, pt well appearing, sxs for weeks so stable for outpt consult with GI and surgery.  Results and differential diagnosis were discussed with the patient. Close follow up outpatient was discussed, patient comfortable with the plan.   Diagnosis: above    Mariea Clonts, MD 12/27/13 [id number]  Mariea Clonts, MD 12/27/13 1630

## 2013-12-27 NOTE — ED Notes (Signed)
160/80 manual BP, right arm

## 2013-12-27 NOTE — Discharge Instructions (Signed)
Follow up with specialist GI and surgery for your rectal prolapse to discuss surgery and treatment options.  If you were given medicines take as directed.  If you are on coumadin or contraceptives realize their levels and effectiveness is altered by many different medicines.  If you have any reaction (rash, tongues swelling, other) to the medicines stop taking and see a physician.   Please follow up as directed and return to the ER or see a physician for new or worsening symptoms.  Thank you.

## 2013-12-28 NOTE — Telephone Encounter (Signed)
Pt states that she is able to fill her rx again on Friday and will just use aleve til then.  She was seen in the ED for a rectal prolapse and needs a follow up for possible surgery.  She states that her daughter will call us to make this appt when she gets home.  Ndea Kilroy,CMA

## 2013-12-31 ENCOUNTER — Telehealth: Payer: Self-pay | Admitting: Family Medicine

## 2013-12-31 DIAGNOSIS — K623 Rectal prolapse: Secondary | ICD-10-CM

## 2013-12-31 NOTE — Telephone Encounter (Signed)
Daughter brought in discharge forms from recent hospital visit. Form requests referral to [facility] Form place in McDiamids box

## 2014-01-01 NOTE — Telephone Encounter (Signed)
Will forward to Dr. Lamar Benes in Dr. McDiarmids absence as he saw her for something similar recently. Fleeger, [name]

## 2014-01-01 NOTE — Telephone Encounter (Signed)
Order placed for referral to GI. Thanks. [name]

## 2014-01-05 ENCOUNTER — Telehealth: Payer: Self-pay | Admitting: Family Medicine

## 2014-01-05 DIAGNOSIS — K623 Rectal prolapse: Secondary | ICD-10-CM

## 2014-01-05 NOTE — Telephone Encounter (Signed)
Nicole Perez does not do anything with rectal prolapse.  She needs a referral [facility] Please advise

## 2014-01-11 ENCOUNTER — Telehealth: Payer: Self-pay | Admitting: Family Medicine

## 2014-01-11 DIAGNOSIS — K623 Rectal prolapse: Secondary | ICD-10-CM

## 2014-01-11 NOTE — Telephone Encounter (Signed)
Will forward to Dr. Yong Channel who is on crosscover.  Pt's daughter is here to find out status of an appt. Jazmin Hartsell,CMA

## 2014-01-11 NOTE — Telephone Encounter (Signed)
Nicole Perez called again.  She wants to talk to [facility]. Says if "she doesn't call me back today, I will be up there tomorrow"

## 2014-01-11 NOTE — Telephone Encounter (Signed)
Order placed for HH

## 2014-01-11 NOTE — Telephone Encounter (Signed)
Daughter says: the information from [facility] has been changed

## 2014-01-11 NOTE — Telephone Encounter (Signed)
Daughter would like to know if they can get a referral for home health evaluation for an aide.  She is having a lot of pain from the rectal prolapse.  Her daughters are the ones who stay the days and night.  She is going down hill with weakness.  Daughter Arbutus Ped would like to be contacted when this has been done.  [phone].  Jazmin Hartsell,CMA

## 2014-01-11 NOTE — Telephone Encounter (Signed)
LM for daughter Arbutus Ped that referral was placed and [facility].  Will call back tomorrow with an appt for [facility] [place] [facility].  Danity Schmelzer,CMA

## 2014-01-11 NOTE — Telephone Encounter (Signed)
Spoke with [facility] and they found out when verifying pt's insurance that she has a different provider as her pcp.  Informed pt and daughter of this and they need to call and change it to Dr. McDiarmid since he is pcp.  I will then call tomorrow for an appt after hearing from daughter or pt when this has been taken care of. [facility]

## 2014-01-11 NOTE — Telephone Encounter (Signed)
Referral completed for Dr. Lamar Benes.

## 2014-01-12 ENCOUNTER — Telehealth: Payer: Self-pay | Admitting: *Deleted

## 2014-01-12 NOTE — Telephone Encounter (Signed)
Appt with [facility] scheduled 01/13/14 at 10:30am.  Pt and daughter advised to arrive 30 mins early to fill out paperwork.  Also this is a work in appt and they might have to wait a little bit to be seen.  They are both aware of this and location of office.  Office number given to pt and daughter in case they need to call.  Notes faxed to them.  [place] Dr. Marcello Moores. Ophia Shamoon,CMA

## 2014-01-12 NOTE — Telephone Encounter (Signed)
Melissa from [facility] called stating that the referral received for home health aide can't be provided.  Referral for a home health aide alone is not considered a skilled need.  Questions please give her a call at [phone].  Derl Barrow, RN

## 2014-01-12 NOTE — Telephone Encounter (Signed)
Spoke with Melissa at [facility] and they are unable to accept her insurance at this time.  Will try to contact [facility] to see if they can evaluate her. Jazmin Hartsell,CMA

## 2014-01-12 NOTE — Telephone Encounter (Signed)
Spoke with [facility] intake @ [phone] and they do accept [place] medicare.  Will fax referral, demos, and insurance to them. Tieara Flitton,CMA

## 2014-01-13 ENCOUNTER — Encounter (INDEPENDENT_AMBULATORY_CARE_PROVIDER_SITE_OTHER): Payer: Self-pay | Admitting: General Surgery

## 2014-01-13 ENCOUNTER — Ambulatory Visit (INDEPENDENT_AMBULATORY_CARE_PROVIDER_SITE_OTHER): Payer: Medicare Other | Admitting: General Surgery

## 2014-01-13 VITALS — BP 140/80 | HR 60 | Temp 97.3°F | Resp 12 | Ht 59.0 in | Wt 158.0 lb

## 2014-01-13 DIAGNOSIS — K648 Other hemorrhoids: Secondary | ICD-10-CM

## 2014-01-13 DIAGNOSIS — K642 Third degree hemorrhoids: Secondary | ICD-10-CM

## 2014-01-13 MED ORDER — HYDROCORTISONE ACETATE 25 MG RE SUPP: 25.0000 mg | Freq: Every day | RECTAL | Status: DC

## 2014-01-13 NOTE — Progress Notes (Signed)
Chief Complaint  Patient presents with  . Rectal Problems    HISTORY: Nicole Perez is a 80 y.o. female who presents to the office with rectal pain.  Other symptoms include burning.  This had been occurring for several weeks.  She has tried miralax in the past with some success.  BM's makes the symptoms worse.   It is continuous in nature.  Her bowel habits are regular and her bowel movements are usually soft.  Her fiber intake is dietary.  Her last colonoscopy was in 2006 and some polyps were found.  She does report prolapsing tissue.     Past Medical History  Diagnosis Date  . Essential hypertension, benign 04/18/2010  . Lichen sclerosus et atrophicus of the vulva   . BACK PAIN, CHRONIC 04/18/2010  . DEGENERATIVE JOINT DISEASE, AXIAL SKELETON 04/21/2010  . DEGENERATIVE JOINT DISEASE, HIPS 09/11/2007  . INSOMNIA, CHRONIC 04/18/2010  . Parotid adenoma 1990, 2012    Right parotid, recurrent parotid pleimorphic adenoma.   Marland Kitchen Spinal stenosis of thoracic region 07/26/2011  . Spinal stenosis of lumbar region 07/26/2011  . Foraminal stenosis of lumbar region 07/26/2011  . Foraminal stenosis of thoracic region 07/26/2011  . Osteoarthritis, multiple sites 08/11/2013  . COLONIC POLYPS, HX OF 12/17/2004    Annotation: polypectomy during colonoscopy in 2006 Qualifier: Diagnosis of  By: McDiarmid MD, Sherren Mocha    . Cystocele 08/11/2013  . Fall     recently x3      Past Surgical History  Procedure Laterality Date  . Total abdominal hysterectomy w/ bilateral salpingoophorectomy  1964    Hysterectomy and bilateral oopherectomy at age 11 for benign reasons  . Bladder suspension      Bladder tack x 2 (Dr Janice Norrie)  . Cystocele repair      Rectal prolapse and cyctocele adter hysterectomy requiring anterior repair Jerilynn Mages Edwinna Areola, MD)  . Rectocele repair      Rectal prolapse and cyctocele adter hysterectomy requiring anterior repair Jerilynn Mages Edwinna Areola, MD)  . Urethral dilation    . Parotid gland tumor excision  1990     S/P excision of Right Parotid Gland Benign Tumor, 1990  . Reconstruction of nose  1994    Nasal bridge reconstruction (Dr Judie Grieve, 1994) for following  Forklift accident on job. Surgery complicated by nerve damage resulting in difficulty raising right eyebrow   . Carpal tunnel release  2010    Carpel Tunnel Release of  left wrist  03/2009 (Dr Fredna Dow): Nerve   . Cataract extraction w/ intraocular lens implant    . Breast lumpectomy      Lumpectomy of benign Breast lumps bilaterally, Dr Bubba Camp  . Laminectomy      S/P L4-5 Laminectomy (1987) for decompression of spinal stenosis  . Colonoscopy w/ polypectomy  2006  . Parotidectomy  08/30/11    Radene Journey, MD (ENT) for recurrent right parotid pleomorphic adenoma by frozen section  . Carpal tunnel release      right wrist        Current Outpatient Prescriptions  Medication Sig Dispense Refill  . diltiazem (DILACOR XR) 240 MG 24 hr capsule Take 240 mg by mouth daily.      . hydrochlorothiazide (MICROZIDE) 12.5 MG capsule Take 1 capsule (12.5 mg total) by mouth daily.  30 capsule  PRN  . HYDROcodone-acetaminophen (NORCO) 5-325 MG per tablet Take 1 tablet by mouth every 4 (four) hours as needed.  6 tablet  0  . HYDROcodone-acetaminophen (NORCO) 7.5-325 MG per tablet  Take 1 tablet by mouth every 6 (six) hours as needed for moderate pain. Fill 30 days after date of prescription      . polyethylene glycol powder (GLYCOLAX/MIRALAX) powder Take 17 g by mouth daily. Increase by 1 packet a day until you are having 1 bowel movement a day.  3350 g  1  . hydrocortisone (ANUSOL-HC) 25 MG suppository Place 1 suppository (25 mg total) rectally at bedtime.  24 suppository  1   No current facility-administered medications for this visit.      No Known Allergies    Family History  Problem Relation Age of Onset  . Coronary artery disease Mother   . Coronary artery disease Sister     open heart surgery  . Diabetes type II Sister   . Stroke Father 54  .  Hypertension Father   . Lupus Brother   . Heart disease Brother   . Heart attack Sister     History   Social History  . Marital Status: Widowed    Spouse Name: N/A    Number of Children: N/A  . Years of Education: N/A   Occupational History  . retired    Social History Main Topics  . Smoking status: Former Smoker    Quit date: 01/14/1984  . Smokeless tobacco: Never Used  . Alcohol Use: No  . Drug Use: No  . Sexual Activity: No     Comment: hysterectomy   Other Topics Concern  . None   Social History Narrative   Has 8 children   [name], [name], involved in her care   4 brothers, 5 sisters, 3 deceased siblings   Cares for her great-grandchildren during the day   Widowed in 2009   Lives alone.   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                REVIEW OF SYSTEMS - PERTINENT POSITIVES ONLY: Review of Systems - General ROS: negative for - chills, fever or weight loss Hematological and Lymphatic ROS: negative for - bleeding problems, blood clots or bruising Respiratory ROS: no cough, shortness of breath, or wheezing Cardiovascular ROS: no chest pain or dyspnea on exertion Gastrointestinal ROS: no abdominal pain, change in bowel habits, or black or bloody stools Genito-Urinary ROS: no dysuria, trouble voiding, or hematuria  EXAM: Filed Vitals:   01/13/14 1132  BP: 140/80  Pulse: 60  Temp: 97.3 F (36.3 C)  Resp: 12    General appearance: alert and cooperative Resp: clear to auscultation bilaterally Cardio: regular rate and rhythm GI: soft, non-tender; bowel sounds normal; no masses,  no organomegaly   Procedure: Anoscopy Surgeon: Marcello Moores Diagnosis: anal pain  Assistant: Alphonzo Severance After the risks and benefits were explained, verbal consent was obtained for above procedure   Anesthesia: none Findings: good resting tone, moderate squeeze, R posterior grade 3 internal hemorrhoid with inflammation    ASSESSMENT AND PLAN: Nicole Perez Is a 80 year old female who presents to my office with increasing anal pain. On exam she has a prolapsed and partially thrombosed right posterior hemorrhoid. I do not see any signs of obvious rectal prolapse but I cannot rule that out at this time. I've recommended to continue the MiraLax and titrate to soft bowel  movements daily. I have given her a prescription for Anusol suppositories. A like her to try these for a period of 10 days at night. I will then see her back in 2 weeks for reevaluation and possible banding if needed. Once we get these hemorrhoids under control we will further evaluate any other symptoms that may be related to possible rectal prolapse.    Rosario Adie, MD Colon and Rectal Surgery / [facility]      Visit Diagnoses: 1. Prolapsed internal hemorrhoids, grade 3     Primary Care Physician: Acquanetta Sit, MD

## 2014-01-13 NOTE — Telephone Encounter (Signed)
Spoke with Nicole Perez from [facility] and they are out of network with [employer].  They referred her to their sister company [facility].  Spoke with daughter Nicole Perez and she is aware they will be contacting the family to schedule a nurse to come out and evaluate [name].  She is aware of this.  States that pt had her consult this morning with [facility] and they are currently treating pt for hemorrhoids.  Nicole Perez,CMA

## 2014-01-13 NOTE — Patient Instructions (Signed)
HEMORRHOIDS    Did you know... Hemorrhoids are one of the most common ailments known.  More than half the population will develop hemorrhoids, usually after age 78.  Millions of Americans currently suffer from hemorrhoids.  The average person suffers in silence for a long period before seeking medical care.  Today's treatment methods make some types of hemorrhoid removal much less painful.  What are hemorrhoids? Often described as "varicose veins of the anus and rectum", hemorrhoids are enlarged, bulging blood vessels in and about the anus and lower rectum. There are two types of hemorrhoids: external and internal, which refer to their location.  External (outside) hemorrhoids develop near the anus and are covered by very sensitive skin. These are usually painless. However, if a blood clot (thrombosis) develops in an external hemorrhoid, it becomes a painful, hard lump. The external hemorrhoid may bleed if it ruptures. Internal (inside) hemorrhoids develop within the anus beneath the lining. Painless bleeding and protrusion during bowel movements are the most common symptom. However, an internal hemorrhoid can cause severe pain if it is completely "prolapsed" - protrudes from the anal opening and cannot be pushed back inside.   What causes hemorrhoids? An exact cause is unknown; however, the upright posture of humans alone forces a great deal of pressure on the rectal veins, which sometimes causes them to bulge. Other contributing factors include:  . Aging  . Chronic constipation or diarrhea  . Pregnancy  . Heredity  . Straining during bowel movements  . Faulty bowel function due to overuse of laxatives or enemas . Spending long periods of time (e.g., reading) on the toilet  Whatever the cause, the tissues supporting the vessels stretch. As a result, the vessels dilate; their walls become thin and bleed. If the stretching and pressure continue, the weakened vessels protrude.  What are the  symptoms? If you notice any of the following, you could have hemorrhoids:  . Bleeding during bowel movements  . Protrusion during bowel movements . Itching in the anal area  . Pain  . Sensitive lump(s)  How are hemorrhoids treated? Mild symptoms can be relieved frequently by increasing the amount of fiber (e.g., fruits, vegetables, breads and cereals) and fluids in the diet. Eliminating excessive straining reduces the pressure on hemorrhoids and helps prevent them from protruding. A sitz bath - sitting in plain warm water for about 10 minutes - can also provide some relief . With these measures, the pain and swelling of most symptomatic hemorrhoids will decrease in two to seven days, and the firm lump should recede within four to six weeks. In cases of severe or persistent pain from a thrombosed hemorrhoid, your physician may elect to remove the hemorrhoid containing the clot with a small incision. Performed under local anesthesia as an outpatient, this procedure generally provides relief. Severe hemorrhoids may require special treatment, much of which can be performed on an outpatient basis.  . Ligation - the rubber band treatment - works effectively on internal hemorrhoids that protrude with bowel movements. A small rubber band is placed over the hemorrhoid, cutting off its blood supply. The hemorrhoid and the band fall off in a few days and the wound usually heals in a week or two. This procedure sometimes produces mild discomfort and bleeding and may need to be repeated for a full effect.  There is a more intense version of this procedure that is done in the OR as outpatient surgery called THD.  It involves identifying blood vessels leading to the   hemorrhoids and then tying them off with sutures.  This method is a little more painful than rubber band ligation but less painful than traditional hemorrhoidectomy and usually does not have to be repeated.  It is best for internal hemorrhoids that  bleed.  Rubber Band Ligation of Internal Hemorrhoids:  A.  Bulging, bleeding, internal hemorrhoid B.  Rubber band applied at the base of the hemorrhoid C.  About 7 days later, the banded hemorrhoid has fallen off leaving a small scar (arrow)  . Injection and Coagulation can also be used on bleeding hemorrhoids that do not protrude. Both methods are relatively painless and cause the hemorrhoid to shrivel up. . Hemorrhoidectomy - surgery to remove the hemorrhoids - is the most complete method for removal of internal and external hemorrhoids. It is necessary when (1) clots repeatedly form in external hemorrhoids; (2) ligation fails to treat internal hemorrhoids; (3) the protruding hemorrhoid cannot be reduced; or (4) there is persistent bleeding. A hemorrhoidectomy removes excessive tissue that causes the bleeding and protrusion. It is done under anesthesia using sutures, and may, depending upon circumstances, require hospitalization and a period of inactivity. Laser hemorrhoidectomies do not offer any advantage over standard operative techniques. They are also quite expensive, and contrary to popular belief, are no less painful.  Do hemorrhoids lead to cancer? No. There is no relationship between hemorrhoids and cancer. However, the symptoms of hemorrhoids, particularly bleeding, are similar to those of colorectal cancer and other diseases of the digestive system. Therefore, it is important that all symptoms are investigated by a physician specially trained in treating diseases of the colon and rectum and that everyone 50 years or older undergo screening tests for colorectal cancer. Do not rely on over-the-counter medications or other self-treatments. See a colorectal surgeon first so your symptoms can be properly evaluated and effective treatment prescribed.  2012 [employer] Rect[employer]     

## 2014-01-13 NOTE — Addendum Note (Signed)
Addended by: Rosario Adie on: 5/69/7948 05:01 PM   Modules accepted: Orders, Medications

## 2014-01-14 ENCOUNTER — Telehealth (INDEPENDENT_AMBULATORY_CARE_PROVIDER_SITE_OTHER): Payer: Self-pay

## 2014-01-14 NOTE — Telephone Encounter (Signed)
I received a fax from the pharmacy yesterday and plan to call the insurance to see what they will cover.

## 2014-01-14 NOTE — Telephone Encounter (Signed)
Pt's daughter called stating Anusol HC note covered under pts insurance and pt cannot afford this suppository[ over 200.00]. Pt wants to know if there is any other rectal cream or suppository that may be used instead. Daughter can be reached at [phone]. I advised her this will be sent to Dr Marcello Moores for review.

## 2014-01-15 ENCOUNTER — Other Ambulatory Visit (INDEPENDENT_AMBULATORY_CARE_PROVIDER_SITE_OTHER): Payer: Self-pay | Admitting: General Surgery

## 2014-01-15 ENCOUNTER — Telehealth: Payer: Self-pay | Admitting: Family Medicine

## 2014-01-15 DIAGNOSIS — K6289 Other specified diseases of anus and rectum: Secondary | ICD-10-CM

## 2014-01-15 MED ORDER — HYDROCORTISONE 2.5 % EX LOTN: TOPICAL_LOTION | CUTANEOUS | Status: DC

## 2014-01-15 NOTE — Progress Notes (Signed)
Notified. 

## 2014-01-15 NOTE — Telephone Encounter (Signed)
I called multiple numbers [phone], [phone], [phone] and [phone].  The last number I held for 10 minutes without getting help.  I am unable to figure out what the insurance will cover.  Please advise Dr Marcello Moores if there is anything else you can do.

## 2014-01-15 NOTE — Telephone Encounter (Signed)
I called and spoke to the daughter.  I left her know Dr Marcello Moores has prescribed a hydrocortisone cream that she can try although less effective.  I asked her to make sure she is on a fiber supplement.  She agrees.  I told her she can call the pharmacy and check on the price for this new cream to see if she can afford

## 2014-01-18 ENCOUNTER — Ambulatory Visit (INDEPENDENT_AMBULATORY_CARE_PROVIDER_SITE_OTHER): Payer: Medicare Other | Admitting: General Surgery

## 2014-02-01 ENCOUNTER — Telehealth: Payer: Self-pay | Admitting: Family Medicine

## 2014-02-01 NOTE — Telephone Encounter (Signed)
Pt is having trouble controlling her bladder. Could something be called in to help? Please advise

## 2014-02-03 NOTE — Telephone Encounter (Signed)
Pt is aware that she needed an appt and made it for tomorrow with crosscover.  Pt states that she will call back to reschedule if her road doesn't get cleared today.  Shaunta Oncale,CMA

## 2014-02-03 NOTE — Telephone Encounter (Signed)
Would need a visit to check her urine etc Thanks  LC

## 2014-02-04 ENCOUNTER — Telehealth: Payer: Self-pay | Admitting: Obstetrics & Gynecology

## 2014-02-04 ENCOUNTER — Encounter: Payer: Self-pay | Admitting: Family Medicine

## 2014-02-04 ENCOUNTER — Ambulatory Visit (INDEPENDENT_AMBULATORY_CARE_PROVIDER_SITE_OTHER): Payer: Medicare Other | Admitting: Family Medicine

## 2014-02-04 VITALS — BP 154/64 | Temp 98.1°F | Ht 59.0 in | Wt 158.0 lb

## 2014-02-04 DIAGNOSIS — R32 Unspecified urinary incontinence: Secondary | ICD-10-CM | POA: Insufficient documentation

## 2014-02-04 DIAGNOSIS — R350 Frequency of micturition: Secondary | ICD-10-CM

## 2014-02-04 LAB — POCT URINALYSIS DIPSTICK
Bilirubin, UA: NEGATIVE
Glucose, UA: NEGATIVE
Ketones, UA: NEGATIVE
LEUKOCYTES UA: NEGATIVE
NITRITE UA: NEGATIVE
Protein, UA: NEGATIVE
RBC UA: NEGATIVE
Spec Grav, UA: 1.02
UROBILINOGEN UA: 0.2
pH, UA: 6

## 2014-02-04 NOTE — Telephone Encounter (Signed)
Reviewed records.  Encounter closed.

## 2014-02-04 NOTE — Telephone Encounter (Signed)
Spoke with patient. Scheduled office visit with Dr. Sabra Heck for tomorrow (per Daughter's request who drives her). Records from today's visit with Dr. Awanda Mink available on Epic.

## 2014-02-04 NOTE — Telephone Encounter (Signed)
Dr is calling asking for patient to be seen she is having some worsening incontinence. Wants up to call patient to schedule her an appt.

## 2014-02-04 NOTE — Progress Notes (Signed)
Nicole Perez is a 80 y.o. female who presents today for incontinence.   Pt with history of severe cystocele with urinary retention with overflow incontinence.  However, she has had worsening in the past two weeks as well, no changes in health or medications during that time.  She states she has had an increased urge to utilize the bathroom with the inability to make it, leaking moderate amounts of urine.  She denies any loss of urine with cough, laughing, or increased abdominal pressure.  She denies any fever, chills, sweats, dysuria, hematuria, polyuria, increased frequency or urgency.  She denies any weight loss, bowel incontinence, weakness in her lower extremities, or loss of sensation.    Past Medical History  Diagnosis Date  . Essential hypertension, benign 04/18/2010  . Lichen sclerosus et atrophicus of the vulva   . BACK PAIN, CHRONIC 04/18/2010  . DEGENERATIVE JOINT DISEASE, AXIAL SKELETON 04/21/2010  . DEGENERATIVE JOINT DISEASE, HIPS 09/11/2007  . INSOMNIA, CHRONIC 04/18/2010  . Parotid adenoma 1990, 2012    Right parotid, recurrent parotid pleimorphic adenoma.   Marland Kitchen Spinal stenosis of thoracic region 07/26/2011  . Spinal stenosis of lumbar region 07/26/2011  . Foraminal stenosis of lumbar region 07/26/2011  . Foraminal stenosis of thoracic region 07/26/2011  . Osteoarthritis, multiple sites 08/11/2013  . COLONIC POLYPS, HX OF 12/17/2004    Annotation: polypectomy during colonoscopy in 2006 Qualifier: Diagnosis of  By: McDiarmid MD, Sherren Mocha    . Cystocele 08/11/2013  . Fall     recently x3    History  Smoking status  . Former Smoker  . Quit date: 01/14/1984  Smokeless tobacco  . Never Used    Family History  Problem Relation Age of Onset  . Coronary artery disease Mother   . Coronary artery disease Sister     open heart surgery  . Diabetes type II Sister   . Stroke Father 40  . Hypertension Father   . Lupus Brother   . Heart disease Brother   . Heart attack Sister     Current  Outpatient Prescriptions on File Prior to Visit  Medication Sig Dispense Refill  . diltiazem (DILACOR XR) 240 MG 24 hr capsule Take 240 mg by mouth daily.      . hydrochlorothiazide (MICROZIDE) 12.5 MG capsule Take 1 capsule (12.5 mg total) by mouth daily.  30 capsule  PRN  . HYDROcodone-acetaminophen (NORCO) 5-325 MG per tablet Take 1 tablet by mouth every 4 (four) hours as needed.  6 tablet  0  . HYDROcodone-acetaminophen (NORCO) 7.5-325 MG per tablet Take 1 tablet by mouth every 6 (six) hours as needed for moderate pain. Fill 30 days after date of prescription      . hydrocortisone (ANUSOL-HC) 25 MG suppository Place 1 suppository (25 mg total) rectally at bedtime.  12 suppository  1  . hydrocortisone 2.5 % lotion Apply to affected area 2 times daily  60 mL  1  . polyethylene glycol powder (GLYCOLAX/MIRALAX) powder Take 17 g by mouth daily. Increase by 1 packet a day until you are having 1 bowel movement a day.  3350 g  1   No current facility-administered medications on file prior to visit.    ROS: Per HPI.  All other systems reviewed and are negative.   Physical Exam Filed Vitals:   02/04/14 1033  BP: 154/64  Temp: 98.1 F (36.7 C)    Physical Examination: General appearance - alert, well appearing, and in no distress Chest - clear to  auscultation, no wheezes, rales or rhonchi, symmetric air entry Heart - normal rate and regular rhythm Abdomen - soft, nontender, nondistended, no masses or organomegaly GU: No suprapubic pain or pressure  Bedside US of bladder - Bladder visualized with large amount of hypoechoic urine (>300 cc)     Chemistry      Component Value Date/Time   NA 141 12/27/2013 1415   NA 143 11/05/2013 1437   K 3.7 12/27/2013 1415   CL 105 12/27/2013 1415   CO2 25 12/27/2013 1415   BUN 13 12/27/2013 1415   BUN 14 11/05/2013 1437   CREATININE 0.66 12/27/2013 1415   CREATININE 0.73 07/21/2013 1426      Component Value Date/Time   CALCIUM 9.0 12/27/2013 1415    ALKPHOS 60 11/05/2013 1437   AST 18 11/05/2013 1437   ALT 12 11/05/2013 1437   BILITOT 0.2 11/05/2013 1437

## 2014-02-04 NOTE — Assessment & Plan Note (Addendum)
Pt with history of severe cystocele with urinary retention with overflow incontinence.  However, she has had worsening in the past two weeks as well, no changes in health or medications during that time.  She states she has had an increased urge to utilize the bathroom with the inability to make it, leaking moderate amounts of urine.  She denies any loss of urine with cough, laughing, or increased abdominal pressure.  She denies any fever, chills, sweats, dysuria, hematuria, polyuria, increased frequency or urgency.  She denies any weight loss, bowel incontinence, weakness in her lower extremities, or loss of sensation.  Most likely a combination incontinence with overflow incontinence with acute exacerbation of this along with a component or urge incontinence.  UA today does not show infectious component and bladder scan performed today showing > 300 cc of urine post-void, so will not put on myrbetriq for urinary urge incontinence.  At this point, appropriate referral back to gynecology for possible surgical procedure (Previously offered pessary which she did not want), and if they do not have much to offer, would consider referral to urology.  F/U in 1-2 months or sooner if needed.

## 2014-02-04 NOTE — Patient Instructions (Signed)
Dr. Ammie Ferrier office will be in touch with you about an appointment.  We will see you back in 1-2 months to see how you are doing and if there is anything further we can do.  Thanks, Dr. Awanda Mink   Urinary Incontinence Urinary incontinence is the involuntary loss of urine from your bladder. CAUSES  There are many causes of urinary incontinence. They include:  Medicines.  Infections.  Prostatic enlargement, leading to overflow of urine from your bladder.  Surgery.  Neurological diseases.  Emotional factors. SIGNS AND SYMPTOMS Urinary Incontinence can be divided into four types: 1. Urge incontinence. Urge incontinence is the involuntary loss of urine before you have the opportunity to go to the bathroom. There is a sudden urge to void but not enough time to reach a bathroom. 2. Stress incontinence. Stress incontinence is the sudden loss of urine with any activity that forces urine to pass. It is commonly caused by anatomical changes to the pelvis and sphincter areas of your body. 3. Overflow incontinence. Overflow incontinence is the loss of urine from an obstructed opening to your bladder. This results in a backup of urine and a resultant buildup of pressure within the bladder. When the pressure within the bladder exceeds the closing pressure of the sphincter, the urine overflows, which causes incontinence, similar to water overflowing a dam. 4. Total incontinence. Total incontinence is the loss of urine as a result of the inability to store urine within your bladder. DIAGNOSIS  Evaluating the cause of incontinence may require:  A thorough and complete medical and obstetric history.  A complete physical exam.  Laboratory tests such as a urine culture and sensitivities. When additional tests are indicated, they can include:  An ultrasound exam.  Kidney and bladder X-rays.  Cystoscopy. This is an exam of the bladder using a narrow scope.  Urodynamic testing to test the nerve  function to the bladder and sphincter areas. TREATMENT  Treatment for urinary incontinence depends on the cause:  For urge incontinence caused by a bacterial infection, antibiotics will be prescribed. If the urge incontinence is related to medicines you take, your health care provider may have you change the medicine.  For stress incontinence, surgery to re-establish anatomical support to the bladder or sphincter, or both, will often correct the condition.  For overflow incontinence caused by an enlarged prostate, an operation to open the channel through the enlarged prostate will allow the flow of urine out of the bladder. In women with fibroids, a hysterectomy may be recommended.  For total incontinence, surgery on your urinary sphincter may help. An artificial urinary sphincter (an inflatable cuff placed around the urethra) may be required. In women who have developed a hole-like passage between their bladder and vagina (vesicovaginal fistula), surgery to close the fistula often is required. HOME CARE INSTRUCTIONS  Normal daily hygiene and the use of pads or adult diapers that are changed regularly will help prevent odors and skin damage.  Avoid caffeine. It can overstimulate your bladder.  Use the bathroom regularly. Try about every 2 3 hours to go to the bathroom, even if you do not feel the need to do so. Take time to empty your bladder completely. After urinating, wait a minute. Then try to urinate again.  For causes involving nerve dysfunction, keep a log of the medicines you take and a journal of the times you go to the bathroom. SEEK MEDICAL CARE IF:  You experience worsening of pain instead of improvement in pain after your procedure.  Your incontinence becomes worse instead of better. SEE IMMEDIATE MEDICAL CARE IF:  You experience fever or shaking chills.  You are unable to pass your urine.  You have redness spreading into your groin or down into your thighs. MAKE SURE  YOU:   Understand these instructions.   Will watch your condition.  Will get help right away if you are not doing well or get worse. Document Released: 01/10/2005 Document Revised: 09/23/2013 Document Reviewed: 05/12/2013 [facility] Patient Information 2014 [place].

## 2014-02-05 ENCOUNTER — Ambulatory Visit (INDEPENDENT_AMBULATORY_CARE_PROVIDER_SITE_OTHER): Payer: Medicare Other | Admitting: Obstetrics & Gynecology

## 2014-02-05 VITALS — BP 122/78 | HR 72 | Temp 97.7°F | Resp 16 | Wt 157.0 lb

## 2014-02-05 DIAGNOSIS — IMO0002 Reserved for concepts with insufficient information to code with codable children: Secondary | ICD-10-CM

## 2014-02-05 DIAGNOSIS — R32 Unspecified urinary incontinence: Secondary | ICD-10-CM

## 2014-02-05 DIAGNOSIS — R339 Retention of urine, unspecified: Secondary | ICD-10-CM

## 2014-02-05 DIAGNOSIS — N8111 Cystocele, midline: Secondary | ICD-10-CM

## 2014-02-05 NOTE — Patient Instructions (Signed)
We will call with information about the referral.

## 2014-02-07 LAB — URINE CULTURE
Colony Count: NO GROWTH
ORGANISM ID, BACTERIA: NO GROWTH

## 2014-02-09 ENCOUNTER — Encounter: Payer: Self-pay | Admitting: Obstetrics & Gynecology

## 2014-02-09 DIAGNOSIS — N319 Neuromuscular dysfunction of bladder, unspecified: Secondary | ICD-10-CM | POA: Insufficient documentation

## 2014-02-09 DIAGNOSIS — R339 Retention of urine, unspecified: Secondary | ICD-10-CM

## 2014-02-09 DIAGNOSIS — R32 Unspecified urinary incontinence: Secondary | ICD-10-CM | POA: Insufficient documentation

## 2014-02-09 HISTORY — DX: Retention of urine, unspecified: R33.9

## 2014-02-09 HISTORY — DX: Neuromuscular dysfunction of bladder, unspecified: N31.9

## 2014-02-09 NOTE — Progress Notes (Signed)
Subjective:     Patient ID: Nicole Perez, female   DOB: [birth year], 80 y.o.   MRN: [id number]  HPI 80 y.o. G8P8 WidowedAfrican AmericanF here for consideration, again, of pessary use.  Pt seen initially in September, 2014, in referral from Dr. Matilde Sprang.  He did not feel she was a good surgical candidate for her significant cystocele.  She had a TAH at age 72 after the birth of her 8th child.  She has undergone two previous bladder repairs that failed.  One included a rectocele repair and that portion of the surgery has seemed to help, long term.  At first visit with me, an attempt was made to fit her with a pessary.  I was unsuccessful and she asked me to stop after several attempts.    She is being sent back to me by her PCP (Dr. Awanda Mink) due to worsening problems with urgency, with starting her stream of urine, with incomplete emptying, and with overflow incontinence.  She is accompanied by her daughter today who has many questions and is very nice.  Daughter stayed in the room with her mother throughout visit.  She was able to visualize the cystocele and ask questions.    Pessary use and surgery discussed with pt and daughter.  Given other medical conditions and two prior surgeries, feel pt is best a candidate for pessary use--if I can find one that fits.  One issue is pt has a shortened vagina due to menopause, hysterectomy, and two prior bladder surgeries.  This limits the choices I have for pessaries.  The other issue is that she was able to push out the pessaries I tried last time while performing a valsalva maneuver.   Review of Systems  Genitourinary: Positive for urgency, frequency and difficulty urinating. Negative for hematuria, vaginal bleeding, vaginal discharge, vaginal pain and pelvic pain.  Skin: Negative.   Neurological: Negative.        Objective:   Physical Exam  Constitutional: She is oriented to person, place, and time. She appears well-developed and well-nourished.   Abdominal: Soft. Bowel sounds are normal.  Genitourinary: There is no rash or lesion on the right labia. There is no rash or lesion on the left labia. No bleeding around the vagina. No vaginal discharge (but significant atrophy present) found.  Cervix and uterus are absent.  No pelvic masses.  Large 4th degree cystocele present.  Musculoskeletal: Normal range of motion.  Neurological: She is alert and oriented to person, place, and time.  Skin: Skin is warm and dry.  Psychiatric: She has a normal mood and affect.   After patient voided, I&O cath performed under sterile conditions for 1200 cc UOP.  Pt felt MUCH better after catheterization was performed.  Urine culture sent.  Pessary fitting with #1 incontinence ring (which was too small) and #2 incontinence ring (which she could push out with a lot of pressure, and #3 incontinence ring (which was too large).    Assessment:     Large cystocele with overflow incontinence, urgency/frequencym incomplete emptying    Plan:     Urine culture Will order a stiff #2 incontinence ring with support and have pt return for fitting D/W pt and daughter possible referral to [facility] for opinion if I cannot get a pessary that will work for pt.  Both in agreement with plan above.  ~45 minutes spent with patient >50% of time was in face to face discussion of above.

## 2014-02-17 ENCOUNTER — Telehealth: Payer: Self-pay

## 2014-02-17 NOTE — Telephone Encounter (Signed)
lmtcb needs to reschedule appointment either with Dr Sabra Heck or another provider//kn

## 2014-02-19 ENCOUNTER — Ambulatory Visit: Payer: Medicare Other | Admitting: Obstetrics & Gynecology

## 2014-02-22 ENCOUNTER — Emergency Department (HOSPITAL_COMMUNITY): Payer: Medicare Other

## 2014-02-22 ENCOUNTER — Observation Stay (HOSPITAL_COMMUNITY)
Admission: EM | Admit: 2014-02-22 | Discharge: 2014-02-25 | Disposition: A | Discharge status: Home / Self Care | Payer: Medicare Other | Attending: Family Medicine | Admitting: Family Medicine

## 2014-02-22 ENCOUNTER — Encounter (HOSPITAL_COMMUNITY): Payer: Self-pay | Admitting: Emergency Medicine

## 2014-02-22 DIAGNOSIS — G8929 Other chronic pain: Secondary | ICD-10-CM | POA: Insufficient documentation

## 2014-02-22 DIAGNOSIS — M159 Polyosteoarthritis, unspecified: Secondary | ICD-10-CM | POA: Insufficient documentation

## 2014-02-22 DIAGNOSIS — H903 Sensorineural hearing loss, bilateral: Secondary | ICD-10-CM

## 2014-02-22 DIAGNOSIS — R32 Unspecified urinary incontinence: Secondary | ICD-10-CM | POA: Insufficient documentation

## 2014-02-22 DIAGNOSIS — E785 Hyperlipidemia, unspecified: Secondary | ICD-10-CM | POA: Insufficient documentation

## 2014-02-22 DIAGNOSIS — Z8701 Personal history of pneumonia (recurrent): Secondary | ICD-10-CM

## 2014-02-22 DIAGNOSIS — E669 Obesity, unspecified: Secondary | ICD-10-CM | POA: Insufficient documentation

## 2014-02-22 DIAGNOSIS — G5790 Unspecified mononeuropathy of unspecified lower limb: Secondary | ICD-10-CM | POA: Insufficient documentation

## 2014-02-22 DIAGNOSIS — N179 Acute kidney failure, unspecified: Secondary | ICD-10-CM | POA: Diagnosis present

## 2014-02-22 DIAGNOSIS — M79609 Pain in unspecified limb: Secondary | ICD-10-CM | POA: Insufficient documentation

## 2014-02-22 DIAGNOSIS — D649 Anemia, unspecified: Secondary | ICD-10-CM | POA: Insufficient documentation

## 2014-02-22 DIAGNOSIS — H9190 Unspecified hearing loss, unspecified ear: Secondary | ICD-10-CM | POA: Insufficient documentation

## 2014-02-22 DIAGNOSIS — R339 Retention of urine, unspecified: Secondary | ICD-10-CM | POA: Insufficient documentation

## 2014-02-22 DIAGNOSIS — E878 Other disorders of electrolyte and fluid balance, not elsewhere classified: Secondary | ICD-10-CM

## 2014-02-22 DIAGNOSIS — N390 Urinary tract infection, site not specified: Secondary | ICD-10-CM | POA: Diagnosis present

## 2014-02-22 DIAGNOSIS — M549 Dorsalgia, unspecified: Secondary | ICD-10-CM

## 2014-02-22 DIAGNOSIS — G4700 Insomnia, unspecified: Secondary | ICD-10-CM | POA: Insufficient documentation

## 2014-02-22 DIAGNOSIS — N10 Acute tubulo-interstitial nephritis: Secondary | ICD-10-CM

## 2014-02-22 DIAGNOSIS — N12 Tubulo-interstitial nephritis, not specified as acute or chronic: Secondary | ICD-10-CM | POA: Insufficient documentation

## 2014-02-22 DIAGNOSIS — E559 Vitamin D deficiency, unspecified: Secondary | ICD-10-CM | POA: Insufficient documentation

## 2014-02-22 DIAGNOSIS — IMO0002 Reserved for concepts with insufficient information to code with codable children: Secondary | ICD-10-CM

## 2014-02-22 DIAGNOSIS — I10 Essential (primary) hypertension: Secondary | ICD-10-CM

## 2014-02-22 HISTORY — DX: Urinary tract infection, site not specified: N39.0

## 2014-02-22 LAB — URINALYSIS, ROUTINE W REFLEX MICROSCOPIC
Bilirubin Urine: NEGATIVE
Glucose, UA: NEGATIVE mg/dL
KETONES UR: NEGATIVE mg/dL
NITRITE: POSITIVE — AB
Protein, ur: 30 mg/dL — AB
SPECIFIC GRAVITY, URINE: 1.015 (ref 1.005–1.030)
UROBILINOGEN UA: 0.2 mg/dL (ref 0.0–1.0)
pH: 5.5 (ref 5.0–8.0)

## 2014-02-22 LAB — URINE MICROSCOPIC-ADD ON

## 2014-02-22 LAB — CBC
HCT: 34.7 % — ABNORMAL LOW (ref 36.0–46.0)
Hemoglobin: 11.8 g/dL — ABNORMAL LOW (ref 12.0–15.0)
MCH: 29 pg (ref 26.0–34.0)
MCHC: 34 g/dL (ref 30.0–36.0)
MCV: 85.3 fL (ref 78.0–100.0)
Platelets: 206 10*3/uL (ref 150–400)
RBC: 4.07 MIL/uL (ref 3.87–5.11)
RDW: 12.4 % (ref 11.5–15.5)
WBC: 7.6 10*3/uL (ref 4.0–10.5)

## 2014-02-22 LAB — BASIC METABOLIC PANEL
BUN: 25 mg/dL — ABNORMAL HIGH (ref 6–23)
CO2: 26 mEq/L (ref 19–32)
CREATININE: 1.1 mg/dL (ref 0.50–1.10)
Calcium: 9.1 mg/dL (ref 8.4–10.5)
Chloride: 93 mEq/L — ABNORMAL LOW (ref 96–112)
GFR, EST AFRICAN AMERICAN: 54 mL/min — AB (ref >90)
GFR, EST NON AFRICAN AMERICAN: 47 mL/min — AB (ref >90)
Glucose, Bld: 120 mg/dL — ABNORMAL HIGH (ref 70–99)
POTASSIUM: 3.5 meq/L — AB (ref 3.7–5.3)
Sodium: 137 mEq/L (ref 137–147)

## 2014-02-22 LAB — LACTIC ACID, PLASMA: Lactic Acid, Venous: 2.3 mmol/L — ABNORMAL HIGH (ref 0.5–2.2)

## 2014-02-22 MED ORDER — SODIUM CHLORIDE 0.9 % IV BOLUS (SEPSIS)
1000.0000 mL | Freq: Once | INTRAVENOUS | Status: AC
  Administered 2014-02-22: 1000 mL via INTRAVENOUS

## 2014-02-22 MED ORDER — DEXTROSE 5 % IV SOLN
1.0000 g | Freq: Once | INTRAVENOUS | Status: AC
  Administered 2014-02-22: 1 g via INTRAVENOUS
  Filled 2014-02-22: qty 10

## 2014-02-22 MED ORDER — SODIUM CHLORIDE 0.9 % IV BOLUS (SEPSIS)
1000.0000 mL | Freq: Once | INTRAVENOUS | Status: AC
  Administered 2014-02-23: 1000 mL via INTRAVENOUS

## 2014-02-22 MED ORDER — POTASSIUM CHLORIDE CRYS ER 20 MEQ PO TBCR
40.0000 meq | EXTENDED_RELEASE_TABLET | Freq: Once | ORAL | Status: AC
  Administered 2014-02-22: 40 meq via ORAL
  Filled 2014-02-22: qty 2

## 2014-02-22 NOTE — Telephone Encounter (Signed)
Appointment rescheduled.//kn 

## 2014-02-22 NOTE — ED Provider Notes (Signed)
CSN: [id number]     Arrival date & time 02/22/14  1606 History   First MD Initiated Contact with Patient 02/22/14 1748     Chief Complaint  Patient presents with  . Fatigue  . Urinary Frequency     (Consider location/radiation/quality/duration/timing/severity/associated sxs/prior Treatment) HPI Comments: Patient is a 80 yo F PMHx significant for HTN, Chronic Back Pain, DJD, Insomnia, Parotid Adenoma, Spinal stenosis, OA, Cystocele presenting to the ED for five days of generalized fatigue with associated chills, cough, decreased appetite. Patient is also complaining of urinary frequency and urgency. This are not new symptoms for the patient, she has a long standing history of urinary symptoms d/t cystocele. Patient has a follow up with her Gyn Dr. Sabra Heck on Wednesday regarding this issue. She does state she had viral gastroenteritis like symptoms two weeks ago but that has resolved. Patient denies any fevers, emesis, diarrhea, dysuria, edema, SOB, CP, abdominal pain. Denies any ETOH use, excessive Tylenol or ASA use, denies any medication changes.   Patient is a 80 y.o. female presenting with frequency.  Urinary Frequency Associated symptoms include chills, coughing and fatigue. Pertinent negatives include no abdominal pain, chest pain, fever, nausea or vomiting.    Past Medical History  Diagnosis Date  . Essential hypertension, benign 04/18/2010  . Lichen sclerosus et atrophicus of the vulva   . BACK PAIN, CHRONIC 04/18/2010  . DEGENERATIVE JOINT DISEASE, AXIAL SKELETON 04/21/2010  . DEGENERATIVE JOINT DISEASE, HIPS 09/11/2007  . INSOMNIA, CHRONIC 04/18/2010  . Parotid adenoma 1990, 2012    Right parotid, recurrent parotid pleimorphic adenoma.   Marland Kitchen Spinal stenosis of thoracic region 07/26/2011  . Spinal stenosis of lumbar region 07/26/2011  . Foraminal stenosis of lumbar region 07/26/2011  . Foraminal stenosis of thoracic region 07/26/2011  . Osteoarthritis, multiple sites 08/11/2013  . COLONIC POLYPS,  HX OF 12/17/2004    Annotation: polypectomy during colonoscopy in 2006 Qualifier: Diagnosis of  By: McDiarmid MD, Sherren Mocha    . Cystocele 08/11/2013  . Fall     recently x3   Past Surgical History  Procedure Laterality Date  . Total abdominal hysterectomy w/ bilateral salpingoophorectomy  1964    Hysterectomy and bilateral oopherectomy at age 24 for benign reasons  . Bladder suspension      Bladder tack x 2 (Dr Janice Norrie)  . Cystocele repair      Rectal prolapse and cyctocele adter hysterectomy requiring anterior repair Jerilynn Mages Edwinna Areola, MD)  . Rectocele repair      Rectal prolapse and cyctocele adter hysterectomy requiring anterior repair Jerilynn Mages Edwinna Areola, MD)  . Urethral dilation    . Parotid gland tumor excision  1990    S/P excision of Right Parotid Gland Benign Tumor, 1990  . Reconstruction of nose  1994    Nasal bridge reconstruction (Dr Judie Grieve, 1994) for following  Forklift accident on job. Surgery complicated by nerve damage resulting in difficulty raising right eyebrow   . Carpal tunnel release  2010    Carpel Tunnel Release of  left wrist  03/2009 (Dr Fredna Dow): Nerve   . Cataract extraction w/ intraocular lens implant    . Breast lumpectomy      Lumpectomy of benign Breast lumps bilaterally, Dr Bubba Camp  . Laminectomy      S/P L4-5 Laminectomy (1987) for decompression of spinal stenosis  . Colonoscopy w/ polypectomy  2006  . Parotidectomy  08/30/11    Radene Journey, MD (ENT) for recurrent right parotid pleomorphic adenoma by frozen section  . Carpal  tunnel release      right wrist   Family History  Problem Relation Age of Onset  . Coronary artery disease Mother   . Coronary artery disease Sister     open heart surgery  . Diabetes type II Sister   . Stroke Father 73  . Hypertension Father   . Lupus Brother   . Heart disease Brother   . Heart attack Sister    History  Substance Use Topics  . Smoking status: Former Smoker    Quit date: 01/14/1984  . Smokeless tobacco:  Never Used  . Alcohol Use: No   OB History   Grav Para Term Preterm Abortions TAB SAB Ect Mult Living   8 8        7      Obstetric Comments   One daughter passed away one year ago All vaginal deliveries     Review of Systems  Constitutional: Positive for chills, appetite change and fatigue. Negative for fever.  Respiratory: Positive for cough. Negative for shortness of breath.   Cardiovascular: Negative for chest pain and leg swelling.  Gastrointestinal: Negative for nausea, vomiting, abdominal pain, diarrhea and constipation.  Genitourinary: Positive for frequency.  All other systems reviewed and are negative.      Allergies  Review of patient's allergies indicates no known allergies.  Home Medications   No current outpatient prescriptions on file. BP 130/57  Pulse 80  Temp(Src) 99.8 F (37.7 C) (Oral)  Resp 26  Ht 5\' 2"  (1.575 m)  Wt 157 lb (71.215 kg)  BMI 28.71 kg/m2  SpO2 96%  LMP 12/17/1962 Physical Exam  Nursing note and vitals reviewed. Constitutional: She is oriented to person, place, and time. She appears well-developed and well-nourished. No distress.  HENT:  Head: Normocephalic and atraumatic.  Right Ear: External ear normal.  Left Ear: External ear normal.  Nose: Nose normal.  Mouth/Throat: Uvula is midline. Mucous membranes are dry. No oropharyngeal exudate.  Eyes: Conjunctivae are normal.  Neck: Normal range of motion. Neck supple.  Cardiovascular: Normal rate, regular rhythm and normal heart sounds.   Pulmonary/Chest: Effort normal.  Abdominal: Soft. Bowel sounds are normal. She exhibits no distension. There is no tenderness. There is no rebound and no guarding.  Musculoskeletal: Normal range of motion.  Neurological: She is alert and oriented to person, place, and time.  Skin: Skin is warm and dry. She is not diaphoretic.  Psychiatric: She has a normal mood and affect.    ED Course  Procedures (including critical care time) Medications   sodium chloride 0.9 % bolus 1,000 mL (not administered)  sodium chloride 0.9 % bolus 1,000 mL (1,000 mLs Intravenous New Bag/Given 02/22/14 1926)  potassium chloride SA (K-DUR,KLOR-CON) CR tablet 40 mEq (40 mEq Oral Given 02/22/14 2129)  cefTRIAXone (ROCEPHIN) 1 g in dextrose 5 % 50 mL IVPB (0 g Intravenous Stopped 02/22/14 2230)    Labs Review Labs Reviewed  CBC - Abnormal; Notable for the following:    Hemoglobin 11.8 (*)    HCT 34.7 (*)    All other components within normal limits  BASIC METABOLIC PANEL - Abnormal; Notable for the following:    Potassium 3.5 (*)    Chloride 93 (*)    Glucose, Bld 120 (*)    BUN 25 (*)    GFR calc non Af Amer 47 (*)    GFR calc Af Amer 54 (*)    All other components within normal limits  URINALYSIS, ROUTINE W REFLEX MICROSCOPIC -  Abnormal; Notable for the following:    APPearance TURBID (*)    Hgb urine dipstick MODERATE (*)    Protein, ur 30 (*)    Nitrite POSITIVE (*)    Leukocytes, UA LARGE (*)    All other components within normal limits  LACTIC ACID, PLASMA - Abnormal; Notable for the following:    Lactic Acid, Venous 2.3 (*)    All other components within normal limits  URINE MICROSCOPIC-ADD ON - Abnormal; Notable for the following:    Bacteria, UA MANY (*)    All other components within normal limits   Imaging Review Dg Chest 2 View  02/22/2014   CLINICAL DATA:  Cough for 3 days.  Chills.  History of hypertension.  EXAM: CHEST  2 VIEW  COMPARISON:  03/29/2013  FINDINGS: Heart size is normal. The lungs are free of focal consolidations and pleural effusions. No pulmonary edema. Significant degenerative changes are seen in the thoracic spine.  IMPRESSION: No active cardiopulmonary disease.   Electronically Signed   By: Shon Hale M.D.   On: 02/22/2014 19:30     EKG Interpretation None      MDM   Final diagnoses:  ARF (acute renal failure)  Hypochloremia  UTI (lower urinary tract infection)   Filed Vitals:   02/22/14 2216  BP:  130/57  Pulse:   Temp: 99.8 F (37.7 C)  Resp: 26    Afebrile, NAD, non-toxic appearing, AAOx4.   Patient is a 80 year old female presented to the emergency department 3 days of fatigue history of urinary frequency and urgency. Abdomen is soft, nontender, nondistended. No neuro focal deficits on examination. Mucous membranes are dry. Patient is noted to be hypocholeremic with elevated BUN and creatinine. No cause for AG (18) at this time. UTI noted, IV Rocephin started. I have reviewed nursing notes, vital signs, and all appropriate lab and imaging results for this patient.Will admit patient for further management and evaluation. Transferred to [facility] to [facility] Medicine Service. Patient d/w with Dr. Audie Pinto, agrees with plan.      Harlow Mares, PA-C 02/22/14 2325

## 2014-02-22 NOTE — ED Notes (Signed)
Patient c/o feeling weak and tired x 2 weeks and worse recently.  Patient also c/o no appetite.

## 2014-02-22 NOTE — ED Notes (Signed)
Pt. Is unable to use the restroom at this time, but is aware that we need a specimen. 

## 2014-02-22 NOTE — H&P (Signed)
[facility] Admission History and Physical Service Pager: [phone]  Patient name: Nicole Perez Medical record number: [id number] Date of birth: 1933-06-10 Age: 80 y.o. Gender: female  Primary Care Provider: MCDIARMID,TODD D, MD Consultants: none Code Status: Full  Chief Complaint: feeling tired all over and pain in right foot and lower back  Assessment and Plan: Nicole Perez is a 79 y.o. female presenting with generalized fatigue . PMH is significant for HTN, spinal stenosis, OA, severe cystocele, urinary incontinence, h/o UTI  # Fatigue: likely from UTI, although symptom description appears viral in nature. No evidence of pneumonia on cxr. Normal electrolytes except for acute renal failure.  - treat UTI - IV fluids - unlikely influenza but will check PCR to rule it out - PT/OT in am   # UTI: no evidence of pyelonephritis. Low back pain appears to be more musculoskeletal in nature.  - received ceftriaxone in ED - urine culture sent after ceftriaxone - convert to orals if afebrile and doing well in the morning.   # Acute renal failure: likely pre-renal in nature with bun/cr ratio of 22. Baseline of 0.66.  - IV fluids given in ED - start maintenance fluids - repeat BMP in am. If not better after fluids, consider more extensive workup: FeNa, renal ultrasound - check bladder scan for post void residual in patient with cystocele that could also be causing some obstruction.   # HTN: well controled. Continue diltiazem and hctz.  - monitor BP  # Chronic back pain: with L and T spine spinal stenosis.  - continue home vicodin  # Right foot pain: chronic since fall last year. Likely neuropathic pain from back injury.  - PT/OT for mobilization - vicodin  # Cystocele: likely contributing to incontinence - patient to be seen by gyn in outpatient setting.   FEN/GI: regular diet Prophylaxis: heparin Disposition: admit to inpatient  History of Present  Illness: Nicole Perez is a 80 y.o. female presenting with generalized fatigue. Fatigue started 2 weeks ago. She is seen by PT twice a week but felt very tired after her sessions in the last couple of weeks. Has felt more sleepy. Reports chills. No documented fever. She has had decreased appetite and has not been eating or drinking normally since middle of last week. She denies any nausea or vomiting. She reports some constipation with last bowel movement being 2 days ago. She has had a mild headache, intermittent in nature. She has had congestion and rhinorrhea. She denies any significant cough.  She reports increased urinary frequency. She has some urinary incontinence at baseline. She denies any abdominal pain or dysuria. Denies chest pain or shortness of breath.  She also reports pain in her right foot and lower back which have been chronic ever since a fall in September.  She lives at home and is surrounded by her 7 children who come take care of her during the day. She otherwise is alone at night.    Review Of Systems: Per HPI with the following additions: none Otherwise 12 point review of systems was performed and was unremarkable.  Patient Active Problem List   Diagnosis Date Noted  . Incomplete emptying of bladder 02/09/2014  . Incontinence 02/09/2014  . Urinary incontinence in female 02/04/2014  . Rectal fissure 12/16/2013  . Osteoarthritis, multiple sites 08/11/2013  . Cystocele 08/11/2013  . Osteoarthritis of both knees 04/22/2013  . Obesity, unspecified 04/22/2013  . Vitamin D deficiency 09/30/2012  . Right shoulder  pain 09/30/2012  . Hearing loss sensory, bilateral 07/26/2011  . History of pneumonia 07/26/2011  . Spinal stenosis of thoracic region 07/26/2011  . Spinal stenosis of lumbar region 07/26/2011  . Lichen sclerosus et atrophicus of the vulva   . Benign parotid tumor, right   . HYPERLIPIDEMIA 05/10/2010  . DEGENERATIVE JOINT DISEASE, AXIAL SKELETON 04/21/2010  .  INSOMNIA, CHRONIC 04/18/2010  . Essential hypertension, benign 04/18/2010  . BACK PAIN, CHRONIC 04/18/2010  . DEGENERATIVE JOINT DISEASE, HIPS 09/11/2007  . 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 COLONIC POLYPS, HX OF 12/17/2004   Past Medical History: Past Medical History  Diagnosis Date  . Essential hypertension, benign 04/18/2010  . Lichen sclerosus et atrophicus of the vulva   . BACK PAIN, CHRONIC 04/18/2010  . DEGENERATIVE JOINT DISEASE, AXIAL SKELETON 04/21/2010  . DEGENERATIVE JOINT DISEASE, HIPS 09/11/2007  . INSOMNIA, CHRONIC 04/18/2010  . Parotid adenoma 1990, 2012    Right parotid, recurrent parotid pleimorphic adenoma.   Marland Kitchen Spinal stenosis of thoracic region 07/26/2011  . Spinal stenosis of lumbar region 07/26/2011  . Foraminal stenosis of lumbar region 07/26/2011  . Foraminal stenosis of thoracic region 07/26/2011  . Osteoarthritis, multiple sites 08/11/2013  . COLONIC POLYPS, HX OF 12/17/2004    Annotation: polypectomy during colonoscopy in 2006 Qualifier: Diagnosis of  By: McDiarmid MD, Sherren Mocha    . Cystocele 08/11/2013  . Fall     recently x3   Past Surgical History: Past Surgical History  Procedure Laterality Date  . Total abdominal hysterectomy w/ bilateral salpingoophorectomy  1964    Hysterectomy and bilateral oopherectomy at age 88 for benign reasons  . Bladder suspension      Bladder tack x 2 (Dr Janice Norrie)  . Cystocele repair      Rectal prolapse and cyctocele adter hysterectomy requiring anterior repair Jerilynn Mages Edwinna Areola, MD)  . Rectocele repair      Rectal prolapse and cyctocele adter hysterectomy requiring anterior repair Jerilynn Mages Edwinna Areola, MD)  . Urethral dilation    . Parotid gland tumor excision  1990    S/P excision of Right Parotid Gland Benign Tumor, 1990  . Reconstruction of nose  1994    Nasal bridge reconstruction (Dr Judie Grieve, 1994) for following  Forklift accident on job. Surgery complicated by nerve damage resulting in difficulty raising right  eyebrow   . Carpal tunnel release  2010    Carpel Tunnel Release of  left wrist  03/2009 (Dr Fredna Dow): Nerve   . Cataract extraction w/ intraocular lens implant    . Breast lumpectomy      Lumpectomy of benign Breast lumps bilaterally, Dr Bubba Camp  . Laminectomy      S/P L4-5 Laminectomy (1987) for decompression of spinal stenosis  . Colonoscopy w/ polypectomy  2006  . Parotidectomy  08/30/11    Radene Journey, MD (ENT) for recurrent right parotid pleomorphic adenoma by frozen section  . Carpal tunnel release      right wrist   Social History: History  Substance Use Topics  . Smoking status: Former Smoker    Quit date: 01/14/1984  . Smokeless tobacco: Never Used  . Alcohol Use: No   Additional social history: none  Please also refer to relevant sections of EMR.  Family History: Family History  Problem Relation Age of Onset  . Coronary artery disease Mother   . Coronary artery disease Sister     open heart surgery  . Diabetes type II Sister   . Stroke Father 63  .  Hypertension Father   . Lupus Brother   . Heart disease Brother   . Heart attack Sister    Allergies and Medications: No Known Allergies No current facility-administered medications on file prior to encounter.   Current Outpatient Prescriptions on File Prior to Encounter  Medication Sig Dispense Refill  . diltiazem (DILACOR XR) 240 MG 24 hr capsule Take 240 mg by mouth every morning.       . hydrochlorothiazide (MICROZIDE) 12.5 MG capsule Take 1 capsule (12.5 mg total) by mouth daily.  30 capsule  PRN  . HYDROcodone-acetaminophen (NORCO) 7.5-325 MG per tablet Take 0.5-1 tablets by mouth every 6 (six) hours as needed for moderate pain.         Objective: BP 125/65  Pulse 80  Temp(Src) 98.5 F (36.9 C)  Resp 16  Ht 5\' 2"  (1.575 m)  Wt 157 lb (71.215 kg)  BMI 28.71 kg/m2  SpO2 99%  LMP 12/17/1962 Exam: General: alert and oriented x3, laying in bed, comfortably sleeping when we woke her up, pleasant, hard of  hearing HEENT: dry mucous membranes, oropharynx clear, PERRLA, EOMI Cardiovascular: S1S2, RRR, no murmur appreciated Respiratory: normal work of breathing, clear to auscultation bilaterally, no wheezing or crackles Abdomen: soft, slightly distended and mildly tender at level of suprapubic area Extremities: no edema, dorsalis pedis pulses present bilaterally Skin: chronic changes on right leg Neuro: CN2-12 grossly intact Back: tenderness to palpation along lumbar spine and paralumbar muscles bilaterally, no CVA tenderness.   Labs and Imaging: CBC BMET   Recent Labs Lab 02/22/14 1805  WBC 7.6  HGB 11.8*  HCT 34.7*  PLT 206    Recent Labs Lab 02/22/14 1805  NA 137  K 3.5*  CL 93*  CO2 26  BUN 25*  CREATININE 1.10  GLUCOSE 120*  CALCIUM 9.1     UA: Results for ARITZA, BRUNET (MRN [id number]) as of 02/23/2014 03:24  Ref. Range 02/22/2014 19:15  Color, Urine Latest Range: YELLOW  YELLOW  APPearance Latest Range: CLEAR  TURBID (A)  Specific Gravity, Urine Latest Range: 1.005-1.030  1.015  pH Latest Range: 5.0-8.0  5.5  Glucose Latest Range: NEGATIVE mg/dL NEGATIVE  Bilirubin Urine Latest Range: NEGATIVE  NEGATIVE  Ketones, ur Latest Range: NEGATIVE mg/dL NEGATIVE  Protein Latest Range: NEGATIVE mg/dL 30 (A)  Urobilinogen, UA Latest Range: 0.0-1.0 mg/dL 0.2  Nitrite Latest Range: NEGATIVE  POSITIVE (A)  Leukocytes, UA Latest Range: NEGATIVE  LARGE (A)  Hgb urine dipstick Latest Range: NEGATIVE  MODERATE (A)  Urine-Other No range found MICROSCOPIC EXAM PERFORMED ON UNCONCENTRATED URINE  WBC, UA Latest Range: <3 WBC/hpf TOO NUMEROUS TO COUNT  RBC / HPF Latest Range: <3 RBC/hpf 3-6  Squamous Epithelial / LPF Latest Range: RARE  RARE  Bacteria, UA Latest Range: RARE  MANY (A)   CXR 02/22/14 EXAM:  CHEST 2 VIEW  COMPARISON: 03/29/2013  FINDINGS:  Heart size is normal. The lungs are free of focal consolidations and  pleural effusions. No pulmonary edema. Significant  degenerative  changes are seen in the thoracic spine.  IMPRESSION:  No active cardiopulmonary disease.   Liam Graham, PGY-2 Family Medicine Resident 02/23/14 at 1:23am

## 2014-02-23 DIAGNOSIS — I10 Essential (primary) hypertension: Secondary | ICD-10-CM

## 2014-02-23 DIAGNOSIS — M549 Dorsalgia, unspecified: Secondary | ICD-10-CM

## 2014-02-23 DIAGNOSIS — N179 Acute kidney failure, unspecified: Secondary | ICD-10-CM

## 2014-02-23 DIAGNOSIS — Z8701 Personal history of pneumonia (recurrent): Secondary | ICD-10-CM

## 2014-02-23 DIAGNOSIS — N10 Acute tubulo-interstitial nephritis: Secondary | ICD-10-CM

## 2014-02-23 HISTORY — DX: Acute kidney failure, unspecified: N17.9

## 2014-02-23 LAB — BASIC METABOLIC PANEL
BUN: 20 mg/dL (ref 6–23)
BUN: 19 mg/dL (ref 6–23)
CHLORIDE: 101 meq/L (ref 96–112)
CO2: 23 mEq/L (ref 19–32)
CO2: 27 meq/L (ref 19–32)
CREATININE: 0.9 mg/dL (ref 0.50–1.10)
Calcium: 8.2 mg/dL — ABNORMAL LOW (ref 8.4–10.5)
Calcium: 8.7 mg/dL (ref 8.4–10.5)
Chloride: 102 mEq/L (ref 96–112)
Creatinine, Ser: 0.89 mg/dL (ref 0.50–1.10)
GFR calc Af Amer: 70 mL/min — ABNORMAL LOW (ref >90)
GFR calc Af Amer: 69 mL/min — ABNORMAL LOW (ref >90)
GFR calc non Af Amer: 60 mL/min — ABNORMAL LOW (ref >90)
GFR, EST NON AFRICAN AMERICAN: 60 mL/min — AB (ref >90)
Glucose, Bld: 111 mg/dL — ABNORMAL HIGH (ref 70–99)
Glucose, Bld: 135 mg/dL — ABNORMAL HIGH (ref 70–99)
Potassium: 4 mEq/L (ref 3.7–5.3)
Potassium: 4.2 mEq/L (ref 3.7–5.3)
SODIUM: 139 meq/L (ref 137–147)
Sodium: 140 mEq/L (ref 137–147)

## 2014-02-23 LAB — INFLUENZA PANEL BY PCR (TYPE A & B)
H1N1 flu by pcr: NOT DETECTED
Influenza A By PCR: NEGATIVE
Influenza B By PCR: NEGATIVE

## 2014-02-23 LAB — CBC
HEMATOCRIT: 29.5 % — AB (ref 36.0–46.0)
HEMOGLOBIN: 10.2 g/dL — AB (ref 12.0–15.0)
MCH: 29.7 pg (ref 26.0–34.0)
MCHC: 34.6 g/dL (ref 30.0–36.0)
MCV: 85.8 fL (ref 78.0–100.0)
Platelets: 179 10*3/uL (ref 150–400)
RBC: 3.44 MIL/uL — ABNORMAL LOW (ref 3.87–5.11)
RDW: 12.7 % (ref 11.5–15.5)
WBC: 5.3 10*3/uL (ref 4.0–10.5)

## 2014-02-23 MED ORDER — HYDROCODONE-ACETAMINOPHEN 7.5-325 MG PO TABS
0.5000 | ORAL_TABLET | Freq: Four times a day (QID) | ORAL | Status: DC | PRN
  Administered 2014-02-23 – 2014-02-25 (×5): 1 via ORAL
  Filled 2014-02-23 (×5): qty 1

## 2014-02-23 MED ORDER — ACETAMINOPHEN 325 MG PO TABS
650.0000 mg | ORAL_TABLET | Freq: Four times a day (QID) | ORAL | Status: DC | PRN
  Administered 2014-02-23: 650 mg via ORAL
  Filled 2014-02-23: qty 2

## 2014-02-23 MED ORDER — HEPARIN SODIUM (PORCINE) 5000 UNIT/ML IJ SOLN
5000.0000 [IU] | Freq: Three times a day (TID) | INTRAMUSCULAR | Status: DC
  Administered 2014-02-23 – 2014-02-25 (×8): 5000 [IU] via SUBCUTANEOUS
  Filled 2014-02-23 (×10): qty 1

## 2014-02-23 MED ORDER — DILTIAZEM HCL ER 240 MG PO CP24
240.0000 mg | ORAL_CAPSULE | Freq: Every morning | ORAL | Status: DC
  Administered 2014-02-23 – 2014-02-25 (×3): 240 mg via ORAL
  Filled 2014-02-23 (×3): qty 1

## 2014-02-23 MED ORDER — ONDANSETRON HCL 4 MG/2ML IJ SOLN: 4.0000 mg | Freq: Four times a day (QID) | INTRAMUSCULAR | Status: DC | PRN

## 2014-02-23 MED ORDER — POLYETHYLENE GLYCOL 3350 17 G PO PACK
17.0000 g | PACK | Freq: Every day | ORAL | Status: DC | PRN
  Filled 2014-02-23: qty 1

## 2014-02-23 MED ORDER — ONDANSETRON HCL 4 MG PO TABS: 4.0000 mg | ORAL_TABLET | Freq: Four times a day (QID) | ORAL | Status: DC | PRN

## 2014-02-23 MED ORDER — ACETAMINOPHEN 650 MG RE SUPP: 650.0000 mg | Freq: Four times a day (QID) | RECTAL | Status: DC | PRN

## 2014-02-23 MED ORDER — DEXTROSE 5 % IV SOLN
1.0000 g | INTRAVENOUS | Status: DC
  Administered 2014-02-23: 1 g via INTRAVENOUS
  Filled 2014-02-23 (×2): qty 10

## 2014-02-23 MED ORDER — HYDROCHLOROTHIAZIDE 12.5 MG PO CAPS
12.5000 mg | ORAL_CAPSULE | Freq: Every day | ORAL | Status: DC
  Administered 2014-02-23 – 2014-02-25 (×3): 12.5 mg via ORAL
  Filled 2014-02-23 (×3): qty 1

## 2014-02-23 NOTE — H&P (Addendum)
FMTS Attending Admission Note: Annabell Sabal MD Personal pager:  [phone] FPTS Service Pager:  [phone]  I  have seen and examined this patient, reviewed their chart. I have discussed this patient with the resident. I agree with the resident's findings, assessment and care plan.  Additionally:  80 yo F presenting with fatigue and decreased appetite for past week or so.  Known history of cystocele, chronic back pain secondary to spinal stenosis, HTN, and multiple UTIs.  Denies any dysuria, but does endorse increased frequency and some incontinence, although she does have this at baseline.  FPTS called for admit secondary to UTI and back pain.    Exam: Gen:  Elderly female, lying in bed.  Febrile, clammy appearing on my exam Abdomen:  Nontender/benign Back:  No CVA tenderness, no bony spinous process tenderness.   Imp/Plan: 1.  Pyelo: - based on UA plus systemic symptoms - unfortunately no culture nor gram stain collected - empiric treatment and assess for improvement.  2.  Fatigue: - question whether this is secondary to UTI as seems to predate symptoms. - agree with PT/OT consult.  Alveda Reasons, MD 02/23/2014 7:23 AM

## 2014-02-23 NOTE — Progress Notes (Signed)
UR completed 

## 2014-02-23 NOTE — Progress Notes (Addendum)
Bladder scan shows 26mL. Tolerated well.

## 2014-02-23 NOTE — Care Management Note (Signed)
   CARE MANAGEMENT NOTE 02/23/2014  Patient:  Nicole Perez, Nicole Perez   Account Number:  [id number]  Date Initiated:  02/23/2014  Documentation initiated by:  Aaran Enberg  Subjective/Objective Assessment:   Referral for HHPT     Action/Plan:   following for progression and d/c needs. Will meet with pt to setup [facility].  02/23/2014 Met with pt and family re HH needs, requesting 3:1 commode, order placed. Pt selected [facility], call placed and orders faxed.   Anticipated DC Date:  02/24/2014   Anticipated DC Plan:  [place]         [facility] Choice  HOME HEALTH   Choice offered to / List presented to:  C-1 Patient   DME arranged  3-N-1      DME agency  [place] arranged  [place] agency  [place]   Status of service:  Completed, signed off Medicare Important Message given?   (If response is "NO", the following Medicare IM given date fields will be blank) Date Medicare IM given:   Date Additional Medicare IM given:    Discharge Disposition:  [place]  Per UR Regulation:    If discussed at Long Length of Stay Meetings, dates discussed:    Comments:

## 2014-02-23 NOTE — Progress Notes (Signed)
Temp 101.5 and 101.1. MD notified. Patient given prn tylenol. Will monitor.  Nicole Perez, Jashad Depaula [place]

## 2014-02-23 NOTE — Progress Notes (Signed)
Patient NSR/1st degree HB on telemetry. CMT notified this RN of patient have some junctional beats. Patient resting comfortably. Dr. Tamala Julian notified. No new orders.  Aaron Edelman, Penni Penado [place]

## 2014-02-23 NOTE — Progress Notes (Signed)
Family Medicine Teaching Service Daily Progress Note Intern Pager: [phone]  Patient name: Nicole Perez Medical record number: [id number] Date of birth: 1933-05-23 Age: 80 y.o. Gender: female  Primary Care Provider: MCDIARMID,TODD D, MD Consultants: none Code Status: full   Pt Overview and Major Events to Date:  3/9: admitted for suspected UTI  3/10: febrile; pyelonephritis now   ABX/Culture CTX 3/9>>  Urine Cx (collected after ABX)>>  Assessment and Plan: Nicole Perez is a 80 y.o. female presenting with generalized fatigue . PMH is significant for HTN, spinal stenosis, OA, severe cystocele, urinary incontinence, h/o UTI   # Fatigue: Most likely contributed from her infection. TSH nml 2.82 (11/05/13), B12 nml (605), folate nml 14.4,    - IV fluids  - flu PCR: pending  - PT/OT in am   #Pyelonephritis:  Febrile this AM to 101. She had a mildly elevated lactic acid 2.3 which could have been contributed from volume depletion.   - CTX continue until 24 hours afebrile.  - urine culture sent after ceftriaxone  - convert to orals if afebrile and doing well in the morning.   # Acute renal failure: Will check Baseline of 0.66.  - IV fluids given in ED  - start maintenance fluids  - repeat BMP in am. If not better after fluids, consider more extensive workup: FeNa, renal ultrasound  - check bladder scan for post void residual in patient with cystocele that could also be causing some obstruction.   # HTN: well controled. Continue diltiazem and hctz. BP ranges 108/50-130/57 - monitor BP   # Chronic back pain: with L and T spine spinal stenosis.  - continue home vicodin   #Neuropathy/Right foot pain: chronic since fall last year. Likely neuropathic pain from back injury.  - PT/OT for mobilization  - vicodin   # Cystocele: likely contributing to incontinence  - patient to be seen by gyn in outpatient setting.   FEN/GI: regular diet  Prophylaxis: heparin   Disposition: pending  improvement   Subjective: She was eating breakfast this morning. She had a fever this morning and just felt hot. She had no other complaints.   Objective: Temp:  [98.5 F (36.9 C)-101.5 F (38.6 C)] 98.9 F (37.2 C) (03/10 0922) Pulse Rate:  [80-93] 84 (03/10 0922) Resp:  [16-26] 18 (03/10 0812) BP: (105-130)/(50-66) 116/61 mmHg (03/10 0922) SpO2:  [96 %-99 %] 97 % (03/10 0812) Weight:  [155 lb (70.308 kg)-157 lb (71.215 kg)] 155 lb (70.308 kg) (03/09 2328) Physical Exam: General: laying in bed, pleasant, hard of hearing, elderly  HEENT: EOMI  Cardiovascular: S1S2, RRR, no murmur appreciated  Respiratory: normal work of breathing, clear to auscultation bilaterally, no wheezing or crackles  Abdomen: soft, slightly distended and mildly tender at level of suprapubic area  Extremities: no edema, dorsalis pedis pulses present bilaterally  Back: tenderness to palpation along lumbar spine and paralumbar muscles bilaterally, no CVA tenderness.    Laboratory:  Recent Labs Lab 02/22/14 1805 02/23/14 0833  WBC 7.6 5.3  HGB 11.8* 10.2*  HCT 34.7* 29.5*  PLT 206 179    Recent Labs Lab 02/22/14 1805  NA 137  K 3.5*  CL 93*  CO2 26  BUN 25*  CREATININE 1.10  CALCIUM 9.1  GLUCOSE 120*    Imaging/Diagnostic Tests:   Rosemarie Ax, MD 02/23/2014, 9:43 AM PGY-1, [place] Intern pager: [phone], text pages welcome

## 2014-02-23 NOTE — ED Provider Notes (Signed)
Medical screening examination/treatment/procedure(s) were performed by non-physician practitioner and as supervising physician I was immediately available for consultation/collaboration.   Dot Lanes, MD 02/23/14 [phone]

## 2014-02-23 NOTE — Evaluation (Signed)
Occupational Therapy Evaluation Patient Details Name: Nicole Perez MRN: [id number] DOB: June 17, 1933 Today's Date: 02/23/2014 Time: 1329-1400 OT Time Calculation (min): 31 min  OT Assessment / Plan / Recommendation History of present illness Nicole Perez is a 80 y.o. female presenting with generalized fatigue . PMH is significant for HTN, spinal stenosis, OA, severe cystocele, urinary incontinence, h/o UTI.   Clinical Impression   Pt presents with below problem list. Education provided during session. Pt with independent with ADLs, PTA. Feel pt will benefit from acute OT to increase independence prior to d/c.     OT Assessment  Patient needs continued OT Services    Follow Up Recommendations  Home health OT;Supervision/Assistance - 24 hour    Barriers to Discharge      Equipment Recommendations  3 in 1 bedside comode    Recommendations for Other Services    Frequency  Min 2X/week    Precautions / Restrictions Precautions Precautions: Fall Precaution Comments: fell day before Labor Day last year without serious injury, but reports has not been the same since Restrictions Weight Bearing Restrictions: No   Pertinent Vitals/Pain No pain reported.     ADL  Grooming: Teeth care;Min guard Where Assessed - Grooming: Supported standing Upper Body Dressing: Set up Where Assessed - Upper Body Dressing: Supported sitting Lower Body Dressing: Min guard Where Assessed - Lower Body Dressing: Supported sit to Lobbyist: Magazine features editor Method: Sit to Loss adjuster, chartered: Regular height toilet Toileting - Water quality scientist and Hygiene: Min guard Where Assessed - Best boy and Hygiene: Sit to stand from 3-in-1 or toilet Tub/Shower Transfer Method: Not assessed Equipment Used: Gait belt;Rolling walker Transfers/Ambulation Related to ADLs: Min guard/Supervision for ambulation and min guard for transfers. ADL Comments: Educated  on energy conservation techniques. Recommended family being with pt for shower transfer or sponge bathing and pt agreeable. Recommended family picking up rugs in house. Discussed bag on walker to carry items. OT told pt to call nursing if incontinent in bed to prevent skin breakdown/sores (pt laying in urine). Performed grooming at sink.     OT Diagnosis: Generalized weakness  OT Problem List: Decreased activity tolerance;Decreased strength;Impaired balance (sitting and/or standing);Decreased knowledge of use of DME or AE;Decreased knowledge of precautions OT Treatment Interventions: Self-care/ADL training;DME and/or AE instruction;Therapeutic activities;Energy conservation;Patient/family education;Balance training;Therapeutic exercise   OT Goals(Current goals can be found in the care plan section) Acute Rehab OT Goals Patient Stated Goal: not stated OT Goal Formulation: With patient Time For Goal Achievement: 03/02/14 Potential to Achieve Goals: Good ADL Goals Pt Will Perform Lower Body Dressing: with modified independence;sit to/from stand Pt Will Transfer to Toilet: with modified independence;ambulating Pt Will Perform Toileting - Clothing Manipulation and hygiene: with modified independence;sit to/from stand Additional ADL Goal #1: Pt will independently verbalize 3/3 energy conservation techniques.   Visit Information  Last OT Received On: 02/23/14 Assistance Needed: +1 History of Present Illness: Nicole Perez is a 80 y.o. female presenting with generalized fatigue . PMH is significant for HTN, spinal stenosis, OA, severe cystocele, urinary incontinence, h/o UTI.       Prior [place] expects to be discharged to:: Private residence Living Arrangements: Alone Available Help at Discharge: Family;Available PRN/intermittently Type of Home: House Home Access: Stairs to enter [employer] of Steps: 3-4 Entrance Stairs-Rails: Right Home  Layout: One level Home Equipment: Walker - 2 wheels Prior Function Level of Independence: Independent with assistive device(s) Comments:  Assist for transportation Communication Communication: No difficulties         Vision/Perception     Cognition  Cognition Arousal/Alertness: Awake/alert Behavior During Therapy: WFL for tasks assessed/performed Overall Cognitive Status: Within Functional Limits for tasks assessed    Extremity/Trunk Assessment Upper Extremity Assessment Upper Extremity Assessment: Overall WFL for tasks assessed Lower Extremity Assessment Lower Extremity Assessment: Defer to PT evaluation RLE Deficits / Details: AROM WFL strength grossly 4-/5 (pain in knee with testing) RLE Sensation: decreased light touch LLE Deficits / Details: AROM WFL strength grossly 4/5 x hip flexion 4-/5 LLE Sensation: decreased light touch     Mobility Bed Mobility Overal bed mobility: Needs Assistance Bed Mobility: Supine to Sit Supine to sit: Supervision Transfers Overall transfer level: Needs assistance Equipment used: Rolling walker (2 wheeled) Transfers: Sit to/from Stand Sit to Stand: Min guard Comments: cues for hand placement.    Exercise        End of Session OT - End of Session Equipment Utilized During Treatment: Gait belt;Rolling walker Activity Tolerance: Patient tolerated treatment well Patient left: in chair;with call bell/phone within reach;with family/visitor present  GO Functional Assessment Tool Used: clinical judgment Functional Limitation: Self care Self Care Current Status (P3825): At least 1 percent but less than 20 percent impaired, limited or restricted Self Care Goal Status (K5397): 0 percent impaired, limited or restricted   Benito Mccreedy OTR/L [phone] 02/23/2014, 3:04 PM

## 2014-02-23 NOTE — Evaluation (Signed)
Physical Therapy Evaluation Patient Details Name: Nicole Perez MRN: [id number] DOB: 1933-04-27 Today's Date: 02/23/2014 Time: 4132-4401 PT Time Calculation (min): 32 min  PT Assessment / Plan / Recommendation History of Present Illness  Nicole Perez is a 80 y.o. female presenting with generalized fatigue . PMH is significant for HTN, spinal stenosis, OA, severe cystocele, urinary incontinence, h/o UTI.  Clinical Impression  Patient presents with decreased independence and safety with mobility due to deficits listed below.  She will benefit from skilled PT in the acute setting to allow return home with family assist and resuming HHPT.  Encouraged pt to get up with assist from staff frequently and to not sit in bed when wet with risk of skin breakdown.    PT Assessment  Patient needs continued PT services    Follow Up Recommendations  Home health PT;Supervision/Assistance - 24 hour    Does the patient have the potential to tolerate intense rehabilitation    N/A  Barriers to Discharge  None      Equipment Recommendations  None recommended by PT    Recommendations for Other Services   OT consult  Frequency Min 3X/week    Precautions / Restrictions Precautions Precautions: Fall Precaution Comments: fell day before Labor Day last year without serious injury, but reports has not been the same since Restrictions Weight Bearing Restrictions: No   Pertinent Vitals/Pain No pain complaints at rest, right knee pain with ambulation      Mobility  Bed Mobility Overal bed mobility: Needs Assistance Bed Mobility: Supine to Sit;Sit to Supine Supine to sit: Supervision;HOB elevated Sit to supine: Supervision General bed mobility comments: family assisted to edge of bed, cues for positioning in bed to supine Transfers Overall transfer level: Needs assistance Transfers: Sit to/from Stand Sit to Stand: Min guard General transfer comment: from bed and  toilet Ambulation/Gait Ambulation/Gait assistance: Min guard;Supervision Ambulation Distance (Feet): 128 Feet Assistive device: Rolling walker (2 wheeled) General Gait Details: loss of balance x 1 looking at me and walking with c/o right knee instability, min assist to recover    Exercises     PT Diagnosis: Abnormality of gait;Generalized weakness  PT Problem List: Decreased strength;Decreased mobility;Decreased safety awareness;Decreased balance;Decreased activity tolerance PT Treatment Interventions: DME instruction;Therapeutic exercise;Gait training;Balance training;Stair training;Functional mobility training;Therapeutic activities;Patient/family education     PT Goals(Current goals can be found in the care plan section) Acute Rehab PT Goals Patient Stated Goal: To go home and resume independent PT Goal Formulation: With patient/family Time For Goal Achievement: 03/09/14 Potential to Achieve Goals: Good  Visit Information  Last PT Received On: 02/23/14 Assistance Needed: +1 History of Present Illness: Nicole Perez is a 80 y.o. female presenting with generalized fatigue . PMH is significant for HTN, spinal stenosis, OA, severe cystocele, urinary incontinence, h/o UTI.       Prior [place] expects to be discharged to:: Private residence Living Arrangements: Alone Available Help at Discharge: Family;Available 24 hours/day Type of Home: House Home Access: Stairs to enter [employer] of Steps: 3-4 Entrance Stairs-Rails: Right Home Layout: One level Home Equipment: Walker - 2 wheels Prior Function Level of Independence: Independent with assistive device(s) Comments: Assist for transportation Communication Communication: No difficulties    Cognition  Cognition Arousal/Alertness: Awake/alert Behavior During Therapy: WFL for tasks assessed/performed Overall Cognitive Status: Within Functional Limits for tasks assessed     Extremity/Trunk Assessment Lower Extremity Assessment Lower Extremity Assessment: LLE deficits/detail;RLE deficits/detail RLE Deficits / Details: AROM WFL strength  grossly 4-/5 (pain in knee with testing) RLE Sensation: decreased light touch LLE Deficits / Details: AROM WFL strength grossly 4/5 x hip flexion 4-/5 LLE Sensation: decreased light touch   Balance Balance Overall balance assessment: Needs assistance Standing balance-Leahy Scale: Poor Standing balance comment: UE support needed for balance  End of Session PT - End of Session Equipment Utilized During Treatment: Gait belt Activity Tolerance: Patient tolerated treatment well Patient left: in bed;with call bell/phone within reach;with family/visitor present  GP Functional Assessment Tool Used: Clinical Judgement Mobility: Walking and Moving Around Current Status (W4132): At least 20 percent but less than 40 percent impaired, limited or restricted Mobility: Walking and Moving Around Goal Status [phone]): At least 1 percent but less than 20 percent impaired, limited or restricted   [facility] 02/23/2014, 11:53 AM Magda Kiel, PT [phone] 02/23/2014

## 2014-02-24 ENCOUNTER — Telehealth: Payer: Self-pay | Admitting: Obstetrics & Gynecology

## 2014-02-24 ENCOUNTER — Ambulatory Visit: Payer: Medicare Other | Admitting: Obstetrics & Gynecology

## 2014-02-24 LAB — BASIC METABOLIC PANEL
BUN: 18 mg/dL (ref 6–23)
CALCIUM: 9 mg/dL (ref 8.4–10.5)
CHLORIDE: 101 meq/L (ref 96–112)
CO2: 26 meq/L (ref 19–32)
Creatinine, Ser: 0.88 mg/dL (ref 0.50–1.10)
GFR calc Af Amer: 71 mL/min — ABNORMAL LOW (ref >90)
GFR calc non Af Amer: 61 mL/min — ABNORMAL LOW (ref >90)
GLUCOSE: 97 mg/dL (ref 70–99)
Potassium: 3.8 mEq/L (ref 3.7–5.3)
SODIUM: 141 meq/L (ref 137–147)

## 2014-02-24 LAB — CBC
HEMATOCRIT: 29.9 % — AB (ref 36.0–46.0)
Hemoglobin: 10.1 g/dL — ABNORMAL LOW (ref 12.0–15.0)
MCH: 29.2 pg (ref 26.0–34.0)
MCHC: 33.8 g/dL (ref 30.0–36.0)
MCV: 86.4 fL (ref 78.0–100.0)
PLATELETS: 215 10*3/uL (ref 150–400)
RBC: 3.46 MIL/uL — AB (ref 3.87–5.11)
RDW: 12.8 % (ref 11.5–15.5)
WBC: 5.3 10*3/uL (ref 4.0–10.5)

## 2014-02-24 LAB — URINE CULTURE

## 2014-02-24 MED ORDER — CEPHALEXIN 500 MG PO CAPS
500.0000 mg | ORAL_CAPSULE | Freq: Two times a day (BID) | ORAL | Status: DC
  Filled 2014-02-24 (×3): qty 1

## 2014-02-24 MED ORDER — CEPHALEXIN 500 MG PO CAPS: 500.0000 mg | ORAL_CAPSULE | Freq: Four times a day (QID) | ORAL | Status: DC

## 2014-02-24 MED ORDER — CEPHALEXIN 500 MG PO CAPS
500.0000 mg | ORAL_CAPSULE | Freq: Four times a day (QID) | ORAL | Status: DC
  Administered 2014-02-24 – 2014-02-25 (×5): 500 mg via ORAL
  Filled 2014-02-24 (×8): qty 1

## 2014-02-24 MED ORDER — CEPHALEXIN 500 MG PO CAPS
500.0000 mg | ORAL_CAPSULE | Freq: Three times a day (TID) | ORAL | Status: DC
  Filled 2014-02-24 (×3): qty 1

## 2014-02-24 NOTE — Progress Notes (Signed)
Physical Therapy Treatment Patient Details Name: Nicole Perez MRN: [id number] DOB: [birth year] Today's Date: 02/24/2014 Time: 4665-9935 PT Time Calculation (min): 13 min  PT Assessment / Plan / Recommendation  History of Present Illness Nicole Perez is a 80 y.o. female presenting with generalized fatigue . PMH is significant for HTN, spinal stenosis, OA, severe cystocele, urinary incontinence, h/o UTI.   PT Comments   Patient limited due to fatigue this afternoon, but up in room moving with little assist and trying to do things as independently as she can.  Encouraged her to call for help now that family gone and set alarm for safety.  Follow Up Recommendations  Home health PT;Supervision/Assistance - 24 hour           Equipment Recommendations  None recommended by PT    Recommendations for Other Services  None  Frequency Min 3X/week   Progress towards PT Goals Progress towards PT goals: Progressing toward goals  Plan Current plan remains appropriate    Precautions / Restrictions Precautions Precautions: Fall   Pertinent Vitals/Pain No pain complaints    Mobility  Bed Mobility Sit to supine: Supervision Transfers Overall transfer level: Needs assistance Transfers: Sit to/from Stand Sit to Stand: Supervision General transfer comment: for safety'; cues for using grabbar in bathroom due to imbalance standing up holding walker Ambulation/Gait Ambulation/Gait assistance: Supervision Ambulation Distance (Feet): 20 Feet Assistive device: Rolling walker (2 wheeled) Gait Pattern/deviations: Step-through pattern;Decreased stride length;Shuffle General Gait Details: in room to bathroom from closet and back to bed after hand washing; too fatigued to go further after up in chair all day with multiple visitors     PT Goals (current goals can now be found in the care plan section)    Visit Information  Last PT Received On: 02/24/14 Assistance Needed: +1 History of Present  Illness: Nicole Perez is a 80 y.o. female presenting with generalized fatigue . PMH is significant for HTN, spinal stenosis, OA, severe cystocele, urinary incontinence, h/o UTI.    Subjective Data      Cognition  Cognition Arousal/Alertness: Awake/alert Behavior During Therapy: WFL for tasks assessed/performed Overall Cognitive Status: Within Functional Limits for tasks assessed    Balance  Balance Overall balance assessment: Needs assistance Sitting balance-Leahy Scale: Good Sitting balance - Comments: sitting reaching to put on briefs Standing balance support: During functional activity;Single extremity supported Standing balance-Leahy Scale: Poor Standing balance comment: needs assist with standing for doff/donning briefs for toileting  End of Session PT - End of Session Activity Tolerance: Patient limited by fatigue Patient left: in bed;with call bell/phone within reach;with bed alarm set   GP     [facility] 02/24/2014, 5:00 PM [place], [place] 02/24/2014

## 2014-02-24 NOTE — Progress Notes (Signed)
FMTS Attending Daily Note:  Jeff Akane Tessier MD  [phone] pager  Family Practice pager:  [phone] I have seen and examined this patient and have reviewed their chart. I have discussed this patient with the resident. I agree with the resident's findings, assessment and care plan.   Kealan Buchan H Damir Leung, MD    

## 2014-02-24 NOTE — Progress Notes (Signed)
FMTS Attending Daily Note:  Annabell Sabal MD  [phone] pager  Family Practice pager:  [phone] I have seen and examined this patient and have reviewed their chart. I have discussed this patient with the resident. I agree with the resident's findings, assessment and care plan.  Additionally:  Transition to PO antibiotics today. If afebrile x 24 hours, able to DC home tomorrow.  Normocytic anemia.  Needs workup outpatient, stable.    Alveda Reasons, MD 02/24/2014 9:56 AM

## 2014-02-24 NOTE — Discharge Instructions (Signed)
Nicole Perez, you were admitted for a kidney infection. The infection responded to the antibiotics that we prescribed you.  You will need to complete the prescription. The antibiotic that we are giving you will need to be taken four times a day. The antibiotics name is called Keflex. Please call the family medicine clinic if you have any fevers or changes in urination.   Please follow up with the doctor as scheduled.   Pyelonephritis, Adult Pyelonephritis is a kidney infection. A kidney infection can happen quickly, or it can last for a long time. HOME CARE   Take your medicine (antibiotics) as told. Finish it even if you start to feel better.  Keep all doctor visits as told.  Drink enough fluids to keep your pee (urine) clear or pale yellow.  Only take medicine as told by your doctor. GET HELP RIGHT AWAY IF:   You have a fever or lasting symptoms for more than 2-3 days.  You have a fever and your symptoms suddenly get worse.  You cannot take your medicine or drink fluids as told.  You have chills and shaking.  You feel very weak or pass out (faint).  You do not feel better after 2 days. MAKE SURE YOU:  Understand these instructions.  Will watch your condition.  Will get help right away if you are not doing well or get worse. Document Released: 01/10/2005 Document Revised: 06/03/2012 Document Reviewed: 05/23/2011 [facility] Patient Information 2014 [place], [place].

## 2014-02-24 NOTE — Telephone Encounter (Signed)
Pt's daughter Nicole Perez calling to let Dr Sabra Heck know that Nicole Perez is in the hospital with a bad kidney and bladder infection.

## 2014-02-24 NOTE — Progress Notes (Signed)
Family Medicine Teaching Service Daily Progress Note Intern Pager: [phone]  Patient name: Nicole Perez Medical record number: [id number] Date of birth: June 18, 1933 Age: 80 y.o. Gender: female  Primary Care Provider: MCDIARMID,TODD D, MD Consultants: none Code Status: full   Pt Overview and Major Events to Date:  3/9: admitted for suspected UTI  3/10: febrile; pyelonephritis now   ABX/Culture CTX 3/9>>  Urine Cx (collected after ABX)>>  Assessment and Plan: Nicole Perez is a 80 y.o. female presenting with generalized fatigue . PMH is significant for HTN, spinal stenosis, OA, severe cystocele, urinary incontinence, h/o UTI   # Fatigue: Seems to be improving with treatment of infection. Although upon admission she had a longer history of fatigue than her urinary type symptoms. Further work up as an outpatient.  - IV fluids  - flu PCR: negative   - PT/OT in am   #Pyelonephritis:  Afebrile for 24 hours.   - transition to keflex 500 mg BID today  - urine culture: 5,000 colonies   # Acute renal failure: resolved  - start maintenance fluids  -Saline lock   # HTN: well controled. - monitor BP   # Chronic back pain: with L and T spine spinal stenosis.  - continue home vicodin   #Neuropathy/Right foot pain: chronic since fall last year. Likely neuropathic pain from back injury.  - PT/OT for mobilization  - vicodin   # Cystocele: likely contributing to incontinence  - patient to be seen by gyn in outpatient setting.   FEN/GI: regular diet  Prophylaxis: heparin   Disposition: pending improvement   Subjective: Patient feeling better today. She has much more of an appetite. She is still complaining of pain in her feet.    Objective: Temp:  [98.3 F (36.8 C)-100.1 F (37.8 C)] 98.3 F (36.8 C) (03/11 0831) Pulse Rate:  [66-81] 66 (03/11 0831) Resp:  [1-17] 1 (03/11 0831) BP: (99-120)/(52-71) 113/53 mmHg (03/11 0831) SpO2:  [92 %-99 %] 99 % (03/11 0831) Physical  Exam: General: laying in bed, pleasant, hard of hearing, elderly  HEENT: EOMI  Cardiovascular: S1S2, RRR, no murmur appreciated  Respiratory: normal work of breathing, clear to auscultation bilaterally, no wheezing or crackles  Extremities: no edema   Laboratory:  Recent Labs Lab 02/22/14 1805 02/23/14 0833  WBC 7.6 5.3  HGB 11.8* 10.2*  HCT 34.7* 29.5*  PLT 206 179    Recent Labs Lab 02/23/14 0833 02/23/14 1052 02/24/14 0654  NA 139 140 141  K 4.0 4.2 3.8  CL 102 101 101  CO2 23 27 26   BUN 20 19 18   CREATININE 0.89 0.90 0.88  CALCIUM 8.2* 8.7 9.0  GLUCOSE 111* 135* 97    Imaging/Diagnostic Tests:   Rosemarie Ax, MD 02/24/2014, 9:33 AM PGY-1, [place] Intern pager: [phone], text pages welcome

## 2014-02-24 NOTE — Discharge Summary (Signed)
[facility] Discharge Summary  Patient name: Nicole Perez Medical record number: [id number] Date of birth: September 04, 1933 Age: 80 y.o. Gender: female Date of Admission: 02/22/2014  Date of Discharge: 02/25/14 Admitting Physician: Alveda Reasons, MD  Primary Care Provider: MCDIARMID,TODD D, MD Consultants: none  Indication for Hospitalization: feeling tired all over and pain in right foot and lower back  Discharge Diagnoses/Problem List:  Patient Active Problem List   Diagnosis Date Noted  . Acute renal failure 02/23/2014  . UTI (urinary tract infection) 02/22/2014  . Incomplete emptying of bladder 02/09/2014  . Incontinence 02/09/2014  . Urinary incontinence in female 02/04/2014  . Rectal fissure 12/16/2013  . Osteoarthritis, multiple sites 08/11/2013  . Cystocele 08/11/2013  . Osteoarthritis of both knees 04/22/2013  . Obesity, unspecified 04/22/2013  . Vitamin D deficiency 09/30/2012  . Right shoulder pain 09/30/2012  . Hearing loss sensory, bilateral 07/26/2011  . History of pneumonia 07/26/2011  . Spinal stenosis of thoracic region 07/26/2011  . Spinal stenosis of lumbar region 07/26/2011  . Lichen sclerosus et atrophicus of the vulva   . Benign parotid tumor, right   . HYPERLIPIDEMIA 05/10/2010  . DEGENERATIVE JOINT DISEASE, AXIAL SKELETON 04/21/2010  . INSOMNIA, CHRONIC 04/18/2010  . Essential hypertension, benign 04/18/2010  . BACK PAIN, CHRONIC 04/18/2010  . DEGENERATIVE JOINT DISEASE, HIPS 09/11/2007  . 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 COLONIC POLYPS, HX OF 12/17/2004   Disposition: home   Discharge Condition: improved   Discharge Exam:  General: laying in bed, pleasant, hard of hearing, elderly  HEENT: EOMI  Cardiovascular: S1S2, RRR, no murmur appreciated  Respiratory: normal work of breathing, clear to auscultation bilaterally, no wheezing or crackles  Extremities: no edema   Brief Hospital Course:   Nicole Perez is a 80 y.o. female presenting with generalized fatigue . PMH is significant for HTN, spinal stenosis, OA, severe cystocele, urinary incontinence, h/o UTI   # Fatigue: Was most likely to be associated with her infection.  This improved as she was being treated below. Previous work up as an outpatient revealed a normal B12 and TSH and Flu PCR was negative.   #Pyelonephritis: Patient was asymptomatic but treated with CTX in the ED based on her UA. Although, she did become febrile (101.5) on the first day of admission making the diagnosis of pyelonephritis. She was treated with CTX for 2 days and then transitioned to keflex 500 mg QID. She was sent home to finish the course of antibiotics. She felt much better prior to discharge.   # Acute renal failure: BUN/cr ratio of 22 upon admission. This resolved with fluids.   # HTN: This is well controlled.    # Chronic back pain: with L and T spine spinal stenosis. Continued on home pain medications.   #Neuropathy/Right foot pain: chronic since fall last year. Likely neuropathic pain from back injury. She was discharged with home health PT and nursing.   # Cystocele: likely contributing to incontinence    Issues for Follow Up:  1. Normocytic anemia: further work up as outpatient.  2. Finished the course of antibiotics.   Significant Procedures: none  Significant Labs and Imaging:   Recent Labs Lab 02/22/14 1805 02/23/14 0833 02/24/14 0857  WBC 7.6 5.3 5.3  HGB 11.8* 10.2* 10.1*  HCT 34.7* 29.5* 29.9*  PLT 206 179 215    Recent Labs Lab 02/22/14 1805 02/23/14 0833 02/23/14 1052 02/24/14 0654 02/25/14 0414  NA 137 139 140  141 141  K 3.5* 4.0 4.2 3.8 4.4  CL 93* 102 101 101 102  CO2 26 23 27 26 23   GLUCOSE 120* 111* 135* 97 102*  BUN 25* 20 19 18  26*  CREATININE 1.10 0.89 0.90 0.88 0.91  CALCIUM 9.1 8.2* 8.7 9.0 8.9    Urinalysis    Component Value Date/Time   COLORURINE YELLOW 02/22/2014 1915   APPEARANCEUR  TURBID* 02/22/2014 1915   LABSPEC 1.015 02/22/2014 1915   PHURINE 5.5 02/22/2014 [place] 02/22/2014 1915   HGBUR MODERATE* 02/22/2014 1915   HGBUR trace-intact 01/01/2011 [place] 02/22/2014 [place] 02/04/2014 [place] 02/22/2014 1915   PROTEINUR 30* 02/22/2014 1915   UROBILINOGEN 0.2 02/22/2014 1915   UROBILINOGEN 0.2 02/04/2014 1040   NITRITE POSITIVE* 02/22/2014 1915   NITRITE NEGATIVE 02/04/2014 1040   LEUKOCYTESUR LARGE* 02/22/2014 1915   CXR 02/22/14  EXAM:  CHEST 2 VIEW  COMPARISON: 03/29/2013  FINDINGS:  Heart size is normal. The lungs are free of focal consolidations and  pleural effusions. No pulmonary edema. Significant degenerative  changes are seen in the thoracic spine.  IMPRESSION:  No active cardiopulmonary disease   Results/Tests Pending at Time of Discharge:   Discharge Medications:    Medication List         cephALEXin 500 MG capsule  Commonly known as:  KEFLEX  Take 1 capsule (500 mg total) by mouth 4 (four) times daily.     diltiazem 240 MG 24 hr capsule  Commonly known as:  DILACOR XR  Take 240 mg by mouth every morning.     hydrochlorothiazide 12.5 MG capsule  Commonly known as:  MICROZIDE  Take 1 capsule (12.5 mg total) by mouth daily.     HYDROcodone-acetaminophen 7.5-325 MG per tablet  Commonly known as:  NORCO  Take 0.5-1 tablets by mouth every 6 (six) hours as needed for moderate pain.     HYDROcodone-acetaminophen 7.5-325 MG per tablet  Commonly known as:  NORCO  Take 0.5-1 tablets by mouth every 6 (six) hours as needed for moderate pain.     ibuprofen 200 MG tablet  Commonly known as:  ADVIL,MOTRIN  Take 400 mg by mouth every 6 (six) hours as needed for mild pain.     polyethylene glycol packet  Commonly known as:  MIRALAX / GLYCOLAX  Take 17 g by mouth daily as needed for moderate constipation.        Discharge Instructions: Please refer to Patient Instructions section of EMR  for full details.  Patient was counseled important signs and symptoms that should prompt return to medical care, changes in medications, dietary instructions, activity restrictions, and follow up appointments.   Follow-Up Appointments: Follow-up Information   Follow up with Rosemarie Ax, MD On 03/04/2014. (1:30 pm )    Specialty:  Family Medicine   Contact information:   [place] [id number] [phone]       Rosemarie Ax, MD 02/27/2014, 9:58 PM PGY-1, [place]

## 2014-02-25 DIAGNOSIS — N8111 Cystocele, midline: Secondary | ICD-10-CM

## 2014-02-25 DIAGNOSIS — H903 Sensorineural hearing loss, bilateral: Secondary | ICD-10-CM

## 2014-02-25 LAB — BASIC METABOLIC PANEL
BUN: 26 mg/dL — AB (ref 6–23)
CALCIUM: 8.9 mg/dL (ref 8.4–10.5)
CO2: 23 meq/L (ref 19–32)
CREATININE: 0.91 mg/dL (ref 0.50–1.10)
Chloride: 102 mEq/L (ref 96–112)
GFR calc Af Amer: 68 mL/min — ABNORMAL LOW (ref >90)
GFR calc non Af Amer: 59 mL/min — ABNORMAL LOW (ref >90)
GLUCOSE: 102 mg/dL — AB (ref 70–99)
Potassium: 4.4 mEq/L (ref 3.7–5.3)
Sodium: 141 mEq/L (ref 137–147)

## 2014-02-25 MED ORDER — HYDROCODONE-ACETAMINOPHEN 7.5-325 MG PO TABS: 0.5000 | ORAL_TABLET | Freq: Four times a day (QID) | ORAL | Status: DC | PRN

## 2014-02-25 MED ORDER — CEPHALEXIN 500 MG PO CAPS: 500.0000 mg | ORAL_CAPSULE | Freq: Four times a day (QID) | ORAL | Status: DC

## 2014-02-25 NOTE — Progress Notes (Signed)
Patient discharged to home. Patient AVS reviewed with patient and patient's daughter. Patient and daughter verbalized understanding of medications and follow-up appointments.  Patient remains stable; no signs or symptoms of distress.  Patient educated to return to the ER in cases of SOB, dizziness, fever, chest pain, or fainting. 3-in-1 commode brought by home health, transported home with patient.

## 2014-02-25 NOTE — Progress Notes (Signed)
FMTS Attending Daily Note:  Annabell Sabal MD  [phone] pager  Family Practice pager:  [phone] I have seen and examined this patient and have reviewed their chart. I have discussed this patient with the resident. I agree with the resident's findings, assessment and care plan.  Additionally:  Much improved, tolerating PO well.  DC home on Keflex today.   Alveda Reasons, MD 02/25/2014 2:58 PM

## 2014-02-25 NOTE — Progress Notes (Signed)
Family Medicine Teaching Service Daily Progress Note Intern Pager: [phone]  Patient name: Nicole Perez Medical record number: [id number] Date of birth: 08/16/1933 Age: 80 y.o. Gender: female  Primary Care Provider: MCDIARMID,TODD D, MD Consultants: none Code Status: full   Pt Overview and Major Events to Date:  3/9: admitted for suspected UTI  3/10: febrile; pyelonephritis now   ABX/Culture CTX 3/9>>3/10 Keflex 3/11>>  Urine Cx (collected after ABX)>>5,000 colonies   Assessment and Plan: Nicole Perez is a 80 y.o. female presenting with generalized fatigue . PMH is significant for HTN, spinal stenosis, OA, severe cystocele, urinary incontinence, h/o UTI   # Fatigue: Seems to be improving with treatment of infection. Although upon admission she had a longer history of fatigue than her urinary type symptoms. Further work up as an outpatient.  - flu PCR: negative   - PT/OT in am   #Pyelonephritis:  Afebrile for 24 hours.   - transition to keflex 500 mg BID today  - urine culture: 5,000 colonies   # Acute renal failure: resolved  - start maintenance fluids  -Saline lock   # HTN: well controled. - monitor BP   # Chronic back pain: with L and T spine spinal stenosis.  - continue home vicodin   #Neuropathy/Right foot pain: chronic since fall last year. Likely neuropathic pain from back injury.  - PT/OT for mobilization  - vicodin   # Cystocele: likely contributing to incontinence  - patient to be seen by gyn in outpatient setting.   FEN/GI: regular diet  Prophylaxis: heparin   Disposition: pending improvement   Subjective: Patient feeling better. She has gotten better each day. Her feet still hurt but this is a chronic problem.   Objective: Temp:  [98.1 F (36.7 C)-99 F (37.2 C)] 98.8 F (37.1 C) (03/12 0500) Pulse Rate:  [66-88] 87 (03/12 0500) Resp:  [17-18] 18 (03/12 0500) BP: (104-130)/(51-72) 129/66 mmHg (03/12 0500) SpO2:  [95 %-100 %] 99 % (03/12  0500) Physical Exam: General: laying in bed, pleasant, hard of hearing, elderly  HEENT: EOMI  Cardiovascular: S1S2, RRR, no murmur appreciated  Respiratory: normal work of breathing, clear to auscultation bilaterally, no wheezing or crackles  Extremities: no edema   Laboratory:  Recent Labs Lab 02/22/14 1805 02/23/14 0833 02/24/14 0857  WBC 7.6 5.3 5.3  HGB 11.8* 10.2* 10.1*  HCT 34.7* 29.5* 29.9*  PLT 206 179 215    Recent Labs Lab 02/23/14 1052 02/24/14 0654 02/25/14 0414  NA 140 141 141  K 4.2 3.8 4.4  CL 101 101 102  CO2 27 26 23   BUN 19 18 26*  CREATININE 0.90 0.88 0.91  CALCIUM 8.7 9.0 8.9  GLUCOSE 135* 97 102*    Imaging/Diagnostic Tests:   Nicole Ax, MD 02/25/2014, 8:25 AM PGY-1, [place] Intern pager: [phone], text pages welcome

## 2014-02-25 NOTE — Care Management Note (Signed)
   CARE MANAGEMENT NOTE 02/25/2014  Patient:  Nicole Perez, Nicole Perez   Account Number:  [id number]  Date Initiated:  02/23/2014  Documentation initiated by:  Almir Botts  Subjective/Objective Assessment:   Referral for HHPT     Action/Plan:   following for progression and d/c needs. Will meet with pt to setup [facility]  02/23/2014 Met with pt and family re HH needs, requesting 3:1 commode, order placed. Pt selected [facility], call placed and orders faxed.   Anticipated DC Date:  02/25/2014   Anticipated DC Plan:  [place]         [facility] Choice  HOME HEALTH   Choice offered to / List presented to:  C-1 Patient   DME arranged  3-N-1      DME agency  [place] arranged  HH-2 PT  HH-1 RN  HH-3 OT      [facility] agency  [place]   Status of service:  Completed, signed off Medicare Important Message given?   (If response is "NO", the following Medicare IM given date fields will be blank) Date Medicare IM given:   Date Additional Medicare IM given:    Discharge Disposition:  [place]  Per UR Regulation:    If discussed at Long Length of Stay Meetings, dates discussed:    Comments:  02/25/2014 Updated orders for [facility] [place] and [place] faxed to [facility] ,also spoke with Mariann Laster at [place] office. 3:1 delivered to pt room by [facility]. Jasmine Pang RN MPH, case manager, [phone]

## 2014-02-25 NOTE — Progress Notes (Signed)
Occupational Therapy Treatment Patient Details Name: IDALYS KONECNY MRN: [id number] DOB: [birth year] Today's Date: 02/25/2014 Time: 4098-1191 OT Time Calculation (min): 15 min  OT Assessment / Plan / Recommendation  History of present illness CHANIECE BARBATO is a 80 y.o. female presenting with generalized fatigue . PMH is significant for HTN, spinal stenosis, OA, severe cystocele, urinary incontinence, h/o UTI.   OT comments  Pt making progress with functional goals and should continue with acute OT services to increase level of function and safety to return home  Follow Up Recommendations  Home health OT;Supervision/Assistance - 24 hour    Barriers to Discharge   none    Equipment Recommendations  3 in 1 bedside comode    Recommendations for Other Services    Frequency Min 2X/week   Progress towards OT Goals Progress towards OT goals: Progressing toward goals  Plan Discharge plan remains appropriate    Precautions / Restrictions Precautions Precautions: Fall Restrictions Weight Bearing Restrictions: No   Pertinent Vitals/Pain No c/o pain    ADL  Grooming: Performed;Wash/dry hands;Wash/dry face;Teeth care;Supervision/safety;Set up Where Assessed - Grooming: Supported standing Lower Body Dressing: Performed;Supervision/safety;Set up;Min guard Where Assessed - Lower Body Dressing: Unsupported sitting;Supported sit to stand Toilet Transfer: Performed;Supervision/safety Toilet Transfer Method: Sit to Loss adjuster, chartered: Regular height toilet;Grab bars Toileting - Clothing Manipulation and Hygiene: Performed;Supervision/safety Where Assessed - Best boy and Hygiene: Standing Transfers/Ambulation Related to ADLs: cues for safety using RW and grab bars ADL Comments: pt able to verbalize 1/3 EC techniques, reviewed EC techniques with pt for ADLs and fucnitonal mobility     OT Diagnosis:    OT Problem List:   OT Treatment Interventions:     OT  Goals(current goals can now be found in the care plan section)    Visit Information  Last OT Received On: 02/25/14 History of Present Illness: MEEKA CARTELLI is a 80 y.o. female presenting with generalized fatigue . PMH is significant for HTN, spinal stenosis, OA, severe cystocele, urinary incontinence, h/o UTI.    Subjective Data   " I will get some in home therapy"          Cognition  Cognition Arousal/Alertness: Awake/alert Behavior During Therapy: WFL for tasks assessed/performed Overall Cognitive Status: Within Functional Limits for tasks assessed    Mobility  Bed Mobility Overal bed mobility: Needs Assistance Bed Mobility: Supine to Sit;Sit to Supine Supine to sit: Supervision Sit to supine: Supervision Transfers Overall transfer level: Needs assistance Equipment used: Rolling walker (2 wheeled) Transfers: Sit to/from Stand Sit to Stand: Supervision General transfer comment: cues for safety using RW and grab bars          Balance Balance Overall balance assessment: Needs assistance Sitting-balance support: No upper extremity supported;Feet supported Sitting balance-Leahy Scale: Good Standing balance support: Single extremity supported;During functional activity;No upper extremity supported Standing balance-Leahy Scale: Fair  End of Session OT - End of Session Equipment Utilized During Treatment: Rolling walker Activity Tolerance: Patient tolerated treatment well Patient left: in bed;with call bell/phone within reach;with bed alarm set  GO     Britt Bottom 02/25/2014, 1:11 PM

## 2014-02-26 ENCOUNTER — Telehealth: Payer: Self-pay | Admitting: Family Medicine

## 2014-02-26 NOTE — Telephone Encounter (Signed)
Nicole Perez called to let the doctor know that Nicole Perez is back with home nursing care. She had a UTI and the will follow up for three weeks. They also have PT schedule and will be doing with her. If you had anu questions please call Nicole Perez at [phone]. jw

## 2014-03-01 ENCOUNTER — Telehealth: Payer: Self-pay | Admitting: Obstetrics & Gynecology

## 2014-03-01 NOTE — Telephone Encounter (Signed)
Pt's daughter [name] is calling to schedule an appointment for pessary insertion there are no available times that work for the patient.

## 2014-03-01 NOTE — Telephone Encounter (Signed)
Call to patient's daughter [name], patient was admitted to hospital last Tuesday evening and that is why they canceled Wed appt.  Had UTI, being treated with antibiotics.  Appt scheduled for tomm at 1215.  Routing to provider for final review. Patient agreeable to disposition. Will close encounter

## 2014-03-02 ENCOUNTER — Ambulatory Visit: Payer: Medicare Other | Admitting: Obstetrics & Gynecology

## 2014-03-03 NOTE — Discharge Summary (Signed)
Family Medicine Teaching Service  Discharge Note : Attending Jeff Eudora Guevarra MD Pager [phone] Inpatient Team Pager:  [phone]  I have reviewed this patient and the patient's chart and have discussed discharge planning with the resident at the time of discharge. I agree with the discharge plan as above.    

## 2014-03-04 ENCOUNTER — Ambulatory Visit (INDEPENDENT_AMBULATORY_CARE_PROVIDER_SITE_OTHER): Payer: Medicare Other | Admitting: Family Medicine

## 2014-03-04 ENCOUNTER — Encounter: Payer: Self-pay | Admitting: Family Medicine

## 2014-03-04 VITALS — BP 122/71 | HR 91 | Temp 98.0°F | Ht 62.0 in | Wt 154.0 lb

## 2014-03-04 DIAGNOSIS — M1710 Unilateral primary osteoarthritis, unspecified knee: Secondary | ICD-10-CM

## 2014-03-04 DIAGNOSIS — IMO0002 Reserved for concepts with insufficient information to code with codable children: Secondary | ICD-10-CM

## 2014-03-04 DIAGNOSIS — N390 Urinary tract infection, site not specified: Secondary | ICD-10-CM

## 2014-03-04 DIAGNOSIS — M170 Bilateral primary osteoarthritis of knee: Secondary | ICD-10-CM

## 2014-03-04 NOTE — Progress Notes (Signed)
    Subjective:     Patient ID: Nicole Perez, female   DOB: [birth year], 80 y.o.   MRN: [id number]  HPI Nicole Perez is a 80 y.o. female here for hospital f/u with PMH HTN, spinal stenosis, OA, severe cystocele, urinary incontinence, h/o UTI.   She has been doing well since being discharged from the hospital. She denies any urinary complains. She feels much better. Her appetite is improved. Her fatigue is also improving. She finished her antibiotics.   She is complaining of right knee pain. She has to ambulate with a rolling walker. She is receiving home health PT. She denies any trauma. The pain doesn't radiate. The pain is worse upon movement. Resting helps the pain go away. She feels that the physical therapy is helping her pain.      Current Outpatient Prescriptions on File Prior to Visit  Medication Sig Dispense Refill  . cephALEXin (KEFLEX) 500 MG capsule Take 1 capsule (500 mg total) by mouth 4 (four) times daily.  20 capsule  0  . diltiazem (DILACOR XR) 240 MG 24 hr capsule Take 240 mg by mouth every morning.       . hydrochlorothiazide (MICROZIDE) 12.5 MG capsule Take 1 capsule (12.5 mg total) by mouth daily.  30 capsule  PRN  . HYDROcodone-acetaminophen (NORCO) 7.5-325 MG per tablet Take 0.5-1 tablets by mouth every 6 (six) hours as needed for moderate pain.       Marland Kitchen HYDROcodone-acetaminophen (NORCO) 7.5-325 MG per tablet Take 0.5-1 tablets by mouth every 6 (six) hours as needed for moderate pain.  30 tablet  0  . ibuprofen (ADVIL,MOTRIN) 200 MG tablet Take 400 mg by mouth every 6 (six) hours as needed for mild pain.      . polyethylene glycol (MIRALAX / GLYCOLAX) packet Take 17 g by mouth daily as needed for moderate constipation.       No current facility-administered medications on file prior to visit.    Review of Systems See HPI    Objective:   Physical Exam BP 122/71  Pulse 91  Temp(Src) 98 F (36.7 C) (Oral)  Ht 5\' 2"  (1.575 m)  Wt 154 lb (69.854 kg)  BMI 28.16  kg/m2  LMP 12/17/1962 Gen: NAD, alert, cooperative with exam, well-appearing, using rolling walker for ambulation  CV: RRR, good S1/S2, no murmur, no edema, capillary refill brisk  Resp: CTABL, no wheezes, non-labored MSK: able to stand but stability is an issue. Needs to use rolling walker for ambulation.  Right knee: full range of motion in active and passive movement. No meniscal pain. No patellar pain.  Left knee: full range of motion in active and passive movement. No meniscal pain. No patellar pain.  Skin: no rashes, normal turgor  Neuro: no gross deficits.  Psych: good insight, alert and oriented      Assessment:         Plan:

## 2014-03-04 NOTE — Patient Instructions (Signed)
Thank you for coming in,   I think you look well and that you have recovered from your kidney infection.   I think the Physical therapy will be the best thing for your knee pain and keeping your stability.   If you continue to have knee pain please follow up with Dr. Wendy Poet.    Please feel free to call with any questions or concerns at any time, at [phone]. --Dr. Raeford Razor

## 2014-03-05 ENCOUNTER — Ambulatory Visit (INDEPENDENT_AMBULATORY_CARE_PROVIDER_SITE_OTHER): Payer: Medicare Other | Admitting: Obstetrics & Gynecology

## 2014-03-05 ENCOUNTER — Encounter: Payer: Self-pay | Admitting: Obstetrics & Gynecology

## 2014-03-05 VITALS — BP 137/80 | HR 99 | Resp 20 | Ht 59.0 in | Wt 155.0 lb

## 2014-03-05 DIAGNOSIS — IMO0002 Reserved for concepts with insufficient information to code with codable children: Secondary | ICD-10-CM

## 2014-03-05 DIAGNOSIS — N8111 Cystocele, midline: Secondary | ICD-10-CM

## 2014-03-05 NOTE — Progress Notes (Signed)
80 y.o. WidowedBlack female [phone] here for pessary placement due to cystocele.  #2 incontinence ring with knob and without knob have been ordered.  Pt here for placement.  Really hopes this will help some as she is not a surgical candidate at this time.  Accompanied by daughter.  Denies any new issues with kidney.  No dysuria.  Does leak quite a bit.    ROS: leaks with activity No dysuria No vaginal discharge or pain  Exam:   BP 137/80  Pulse 99  Resp 20  Ht 4\' 11"  (1.499 m)  Wt 155 lb (70.308 kg)  BMI 31.29 kg/m2  LMP 12/17/1962 General appearance: alert and cooperative Inguinal adenopathy: neg   Pelvic: External genitalia:  no lesions              Urethra: normal appearing urethra with no masses, tenderness or lesions              Bartholins and Skenes: Bartholin's, Urethra, Skene's normal                 Vagina: normal appearing vagina with normal color and discharge, no lesions              Cervix: absent Bimanual Exam:  Uterus:  absent                               Adnexa:    no masses                               Anus:  normal sphincter tone, no lesions  Pessary, #2 incontinence ring with knob, was placed.  Pt did feel it but was able to ambulate with it.  Could not valsalva it out.  Pt was, however, unable to void with it in place.  As is late in the day, I do not want her to leave with pessary in case she gets home and cannot void.  Pessary was removed.  Small amount of bleeding was noted.    A: Cystocele Cannot void with current pessary in place  P:  Return next Wed for placement and hopefully she will be able to void then.

## 2014-03-08 ENCOUNTER — Encounter: Payer: Self-pay | Admitting: Family Medicine

## 2014-03-08 NOTE — Assessment & Plan Note (Signed)
Completed course of antibiotics from hospital. She is feeling much better.  - follow up as needed.

## 2014-03-08 NOTE — Assessment & Plan Note (Signed)
Pain in right knee. 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 Improving with physical therapy.  - Continue physical therapy for now. Agreeable with that plan. Hopeful that gaining strength will decreased pain.  - f/u with pcp as needed.

## 2014-03-10 ENCOUNTER — Ambulatory Visit (INDEPENDENT_AMBULATORY_CARE_PROVIDER_SITE_OTHER): Payer: Medicare Other | Admitting: Obstetrics & Gynecology

## 2014-03-10 ENCOUNTER — Encounter: Payer: Self-pay | Admitting: Obstetrics & Gynecology

## 2014-03-10 VITALS — BP 130/74 | HR 58 | Resp 16 | Ht 59.0 in | Wt 155.8 lb

## 2014-03-10 DIAGNOSIS — R32 Unspecified urinary incontinence: Secondary | ICD-10-CM

## 2014-03-10 DIAGNOSIS — IMO0002 Reserved for concepts with insufficient information to code with codable children: Secondary | ICD-10-CM

## 2014-03-10 DIAGNOSIS — N8111 Cystocele, midline: Secondary | ICD-10-CM

## 2014-03-10 DIAGNOSIS — R339 Retention of urine, unspecified: Secondary | ICD-10-CM

## 2014-03-10 MED ORDER — HYDROCODONE-ACETAMINOPHEN 7.5-325 MG PO TABS: 0.5000 | ORAL_TABLET | Freq: Four times a day (QID) | ORAL | Status: DC | PRN

## 2014-03-10 NOTE — Progress Notes (Signed)
Patient ID: Nicole Perez, female   DOB: 28-Oct-1933, 80 y.o.   MRN: [id number]  80 y.o. WidowedBlack female [phone] here for pessary placement.  Was seen last week and #2 ring with knob was placed.  Patient was unable to void so she is back today for a smaller pessary.  She has consumed 16 oz of water before her appt today.  Had a little spotting after last appt.  She is feeling better than last week.  ROS: no abnormal bleeding, pelvic pain or discharge, {ros urinary  Exam:   BP 130/74  Pulse 58  Resp 16  Ht 4\' 11"  (1.499 m)  Wt 155 lb 12.8 oz (70.67 kg)  BMI 31.45 kg/m2  LMP 12/17/1962 General appearance: alert and cooperative  Pelvic: External genitalia:  no lesions              Urethra: normal appearing urethra with no masses, tenderness or lesions              Bartholins and Skenes: Bartholin's, Urethra, Skene's normal                 Vagina: normal appearing vagina with normal color and discharge, no lesions              Cervix: absent Bimanual Exam:  Uterus:  surgically absent, vaginal cuff well healed, large cystocele present                       Pessary was placed without difficulty.  Pt could not valsalva out the pessary and she could void without difficulty.  A: Cystocele- symptomatic      Incomplete bladder emptying      Urinary incontinence      Recent UTI and acute renal failure  P:  Return to office in 3-4 months for recheck.  An After Visit Summary was printed and given to the patient.

## 2014-03-11 ENCOUNTER — Ambulatory Visit (INDEPENDENT_AMBULATORY_CARE_PROVIDER_SITE_OTHER): Payer: Medicare Other | Admitting: Family Medicine

## 2014-03-11 ENCOUNTER — Encounter: Payer: Self-pay | Admitting: Family Medicine

## 2014-03-11 VITALS — BP 147/77 | HR 99 | Temp 98.3°F | Ht 59.0 in | Wt 154.0 lb

## 2014-03-11 DIAGNOSIS — I10 Essential (primary) hypertension: Secondary | ICD-10-CM

## 2014-03-11 DIAGNOSIS — M1710 Unilateral primary osteoarthritis, unspecified knee: Secondary | ICD-10-CM

## 2014-03-11 DIAGNOSIS — IMO0002 Reserved for concepts with insufficient information to code with codable children: Secondary | ICD-10-CM

## 2014-03-11 DIAGNOSIS — M48061 Spinal stenosis, lumbar region without neurogenic claudication: Secondary | ICD-10-CM

## 2014-03-11 DIAGNOSIS — Z23 Encounter for immunization: Secondary | ICD-10-CM

## 2014-03-11 DIAGNOSIS — M170 Bilateral primary osteoarthritis of knee: Secondary | ICD-10-CM

## 2014-03-11 MED ORDER — HYDROCODONE-ACETAMINOPHEN 5-325 MG PO TABS: 1.0000 | ORAL_TABLET | Freq: Four times a day (QID) | ORAL | Status: DC | PRN

## 2014-03-11 MED ORDER — HYDROCODONE-ACETAMINOPHEN 7.5-325 MG PO TABS: 0.5000 | ORAL_TABLET | Freq: Four times a day (QID) | ORAL | Status: DC | PRN

## 2014-03-11 NOTE — Assessment & Plan Note (Addendum)
Discussed with Ms Nicole Perez the chronic progressive nature of lumbar spinal stenosis, the importance of remaining active as much as possible, the role of surgical and medication (analgesic) therapies in maintaining function.  She understands that surgical intervention would be difficult and rehab long and improvement in function uncertain. She will consider Epidural injections if recommended by her orthopedists.  We discussed role of chronic opiate therapy for maintenance of function as long as it is not causing undo adverse effects.  Pt currently tolerating opiate treatment without significant adverse effects.

## 2014-03-11 NOTE — Patient Instructions (Signed)
I believe much of the pain in your leg and back is coming from the narrowing of your spinal canal, this is called Spinal Stenosis  Use the pain medication to help you stay active.  Do the exercises recommended by your Physical Therapist, you will slowly get better.  If you pain in your knees is not improved, Dr Yovan Leeman can do a steroid injections into the knees.   Get the Ankle compression stocking from the Drug store.  Wear it during the day on your right ankle to decrease the swelling and discomfort  Spinal Stenosis Spinal stenosis is when the open spaces between the bones of your spine (vertebrae) get smaller (narrow). It is caused by bone pushing on the open spaces of your spine. This puts pressure on your spine and the nerves in your spine. You may be given medicine to lessen the puffiness (inflammation) in your nerves. Other times, surgery is needed. HOME CARE  Change positions when you sit, stand, and lie. This can help take pressure off your nerves.  Do exercises as told by your doctor to strengthen the middle part of your body.  Lose weight if your doctor suggests it. This takes pressure off your spine.  Take all medicine as told by your doctor. MAKE SURE YOU:  Understand these instructions.  Will watch your condition.  Will get help right away if you are not doing well or get worse. Document Released: 03/29/2011 Document Revised: 08/05/2013 Document Reviewed: 03/13/2013 [facility] Patient Information 2014 [place].  Ankle Sprain An ankle sprain is an injury to the strong, fibrous tissues (ligaments) that hold your ankle bones together.  HOME CARE   Put ice on your ankle for 1 2 days or as told by your doctor.  Put ice in a plastic bag.  Place a towel between your skin and the bag.  Leave the ice on for 15-20 minutes at a time, every 2 hours while you are awake.  Only take medicine as told by your doctor.  Raise (elevate) your injured ankle above the level  of your heart as much as possible for 2 3 days.  Use crutches if your doctor tells you to. Slowly put your own weight on the affected ankle. Use the crutches until you can walk without pain.  If you have a plaster splint:  Do not rest it on anything harder than a pillow for 24 hours.  Do not put weight on it.  Do not get it wet.  Take it off to shower or bathe.  If given, use an elastic wrap or support stocking for support. Take the wrap off if your toes lose feeling (numb), tingle, or turn cold or blue.  If you have an air splint:  Add or let out air to make it comfortable.  Take it off at night and to shower and bathe.  Wiggle your toes and move your ankle up and down often while you are wearing it. GET HELP RIGHT AWAY IF:   Your toes lose feeling (numb) or turn blue.  You have severe pain that is increasing.  You have rapidly increasing bruising or puffiness (swelling).  Your toes feel very cold.  You lose feeling in your foot.  Your medicine does not help your pain. MAKE SURE YOU:   Understand these instructions.  Will watch your condition.  Will get help right away if you are not doing well or get worse. Document Released: 05/21/2008 Document Revised: 08/27/2012 Document Reviewed: 06/16/2012 [employer] Patient Information  2014 [place], [place].  Marland Kitchen

## 2014-03-11 NOTE — Assessment & Plan Note (Signed)
Adequate blood pressure control.  No evidence of new end organ damage.  Tolerating medication without significant adverse effects.  Plan to continue current blood pressure regiment.   

## 2014-03-11 NOTE — Progress Notes (Signed)
   Subjective:    Patient ID: Nicole Perez, female    DOB: Aug 29, 1933, 80 y.o.   MRN: [id number] Patient accompanied by his dgt,  HPI Problem List Items Addressed This Visit     Cardiovascular and Mediastinum   Essential hypertension, benign (Chronic)  Disease Monitoring  Blood pressure range: not checking at home  Chest pain: no   Dyspnea: no   Claudication: no   Medication compliance: no  Medication Side Effects  Lightheadedness: no   Urinary frequency: no   Edema: no    Preventitive Healthcare:  Exercise: no   Diet Pattern: no restriction       Musculoskeletal and Integument   Osteoarthritis of both knees (Chronic) and Spinal Stenosis (Lumbar) - Longstanding osteoarthritis of the bilateral knees abdomen of lumbar spine with, with increase pain in back and knees -  Home Health Physical therapy is helping.  - Pain is on the knees and low back  - Quality: There is no swelling, no redness, or no increased warmth. The pain is described as aching. occasionally. There is no burning.  - Pattern: daily - Duration: several weeks exacerbation Associated symptoms: Includes stiffness and weakness. There is no sleep loss and no instability.  Hip Pain: no  Back pain: yes.  Radicular type pain: no Modifying factors: Includes weight bearing pain and pain with ambulation. There is no sitting, and no night pain. There is no pain with weather change. Hydrocodone-APAP medication does helps.  Assistive devices: Using a walker     No smoking Outpatient Encounter Prescriptions as of 03/11/2014  Medication Sig  . diltiazem (DILACOR XR) 240 MG 24 hr capsule Take 240 mg by mouth every morning.   . hydrochlorothiazide (MICROZIDE) 12.5 MG capsule Take 1 capsule (12.5 mg total) by mouth daily.  Marland Kitchen HYDROcodone-acetaminophen (NORCO) 5-325 MG per tablet Take 1 tablet by mouth every 6 (six) hours as needed for moderate pain.  Marland Kitchen ibuprofen (ADVIL,MOTRIN) 200 MG tablet Take 400 mg by mouth every 6  (six) hours as needed for mild pain.  . polyethylene glycol (MIRALAX / GLYCOLAX) packet Take 17 g by mouth daily as needed for moderate constipation.  Marland Kitchen HYDROcodone-acetaminophen (NORCO) 5-325 MG per tablet Take 1 tablet by mouth every 6 (six) hours as needed for moderate pain. Fill 30 days after date of prescription  . HYDROcodone-acetaminophen (NORCO) 5-325 MG per tablet Take 1 tablet by mouth every 6 (six) hours as needed for moderate pain. Fill 60 days after date of prescription  . [DISCONTINUED] HYDROcodone-acetaminophen (NORCO) 7.5-325 MG per tablet Take 0.5-1 tablets by mouth every 6 (six) hours as needed for moderate pain.  . [DISCONTINUED] HYDROcodone-acetaminophen (NORCO) 7.5-325 MG per tablet Take 0.5-1 tablets by mouth every 6 (six) hours as needed for moderate pain.   Review of Systems See HPI ADLs: Patient is independent in her ADLs IADLs: needs assistance with shopping, housekeeping independent for light housework, needs assistance with heavy housework,  Self-admin medications, food prep is independent,     Objective:   Physical Exam VS reviewed GEN: Alert, Cooperative, Groomed, NAD HEENT: PERRL; EAC bilaterally not occluded, COR: RRR, No M/G/R, LUNGS: BCTA, No Acc mm use, speaking in full sentences EXT: Trace bilateral leg edema. No swelling of knees. No erythema of knees.   SKIN: No lesion nor rashes of face/trunk/extremities Psych: Normal affect/thought/speech/language    Assessment & Plan:

## 2014-03-11 NOTE — Assessment & Plan Note (Signed)
If pain in knees not improved with opiate therapy, then patient to return for knee injections with corticosteroids.

## 2014-03-15 ENCOUNTER — Telehealth: Payer: Self-pay | Admitting: Family Medicine

## 2014-03-15 DIAGNOSIS — I10 Essential (primary) hypertension: Secondary | ICD-10-CM

## 2014-03-15 NOTE — Telephone Encounter (Signed)
Pt called and needs refills on her Dilacor XR and Microzide. jw

## 2014-03-16 MED ORDER — DILTIAZEM HCL ER 240 MG PO CP24: 240.0000 mg | ORAL_CAPSULE | Freq: Every morning | ORAL | Status: DC

## 2014-03-16 MED ORDER — HYDROCHLOROTHIAZIDE 12.5 MG PO CAPS: 12.5000 mg | ORAL_CAPSULE | Freq: Every day | ORAL | Status: DC

## 2014-03-16 NOTE — Telephone Encounter (Signed)
Refilled Dilacor XR and Microzide

## 2014-03-17 ENCOUNTER — Ambulatory Visit: Payer: Medicare Other | Admitting: Neurology

## 2014-03-18 ENCOUNTER — Other Ambulatory Visit: Payer: Self-pay | Admitting: Family Medicine

## 2014-03-23 ENCOUNTER — Telehealth: Payer: Self-pay | Admitting: Obstetrics & Gynecology

## 2014-03-23 DIAGNOSIS — R339 Retention of urine, unspecified: Secondary | ICD-10-CM

## 2014-03-23 DIAGNOSIS — IMO0002 Reserved for concepts with insufficient information to code with codable children: Secondary | ICD-10-CM

## 2014-03-23 DIAGNOSIS — R32 Unspecified urinary incontinence: Secondary | ICD-10-CM

## 2014-03-23 NOTE — Telephone Encounter (Signed)
Spoke with Dr. Sabra Heck. Okay to enter in referrral to [facility] Dr. Zigmund Daniel or Provider for second opinion for surgical options.  Will co-ordinate with daughter to have patient come in for pessary. Message left to return call to [place] at [phone] again.

## 2014-03-23 NOTE — Telephone Encounter (Signed)
Spoke with patient. She states that on Saturday night 03/20/14 her pessary has fallen out. She states she is now leaking urine, but that is normal for her when pessary is not in. Patient denies dysuria, no difficulty with having bowel movement. Patient is wondering if she needs referral to [facility] that she states she discussed with Dr. Sabra Heck as she sates "I am just having a hard time with this". She requested that I call her daughter, Suzi Roots to coordinate an office visit for replacement. Message left to [facility] to return my call.

## 2014-03-23 NOTE — Telephone Encounter (Signed)
Pessary came out last night when going to the bathroom. And coughed and it just came out.

## 2014-03-24 NOTE — Telephone Encounter (Signed)
Spoke with patient, [name]. Advised I was trying to reach her daughter to co-ordinate her care for office visit, but was unable to reach her yesterday. Patient states she is aware that I called her twice yesterday. She advised to me to await return call from her daughter, Nicole Perez.  I advised referral placed for [facility] and they will contact her. Will await return call from patient's daughter to schedule office visit.

## 2014-03-24 NOTE — Telephone Encounter (Signed)
Patient's daughter returned call. She is available to bring patient in on Monday or Tuesday for reinsertion of Pessary. She states that patient is uncomfortable with pessary and will not speak with this RN regarding pessary problems but prefers to speak with Dr. Sabra Heck (Mrs. Sowle).  I advised that Mrs. Traore can speak with Dr. Sabra Heck at time of appointment to discuss any further changes to pessary size/shape/issues with keeping pessary in.  Daughter of patient also requests referral. Advised referral had been placed to [facility] pelvic health in [place] and they would contact patient to schedule.   Scheduled procedure appointment with Dr. Sabra Heck for 4/13 at 0900.   Routing to Dr. Sabra Heck for review. Encounter closed prior.

## 2014-03-29 ENCOUNTER — Telehealth: Payer: Self-pay | Admitting: Family Medicine

## 2014-03-29 ENCOUNTER — Telehealth: Payer: Self-pay | Admitting: Obstetrics & Gynecology

## 2014-03-29 ENCOUNTER — Ambulatory Visit: Payer: Medicare Other | Admitting: Obstetrics & Gynecology

## 2014-03-29 NOTE — Telephone Encounter (Signed)
Patient has cancelled appointment that was scheduled for today.   Spoke with Daughter, Nicole Perez Patient. She states she has an appointment for 4/28 at [facility] pelvic Health. Advised that we requested referral to Pelvic Health in [place].  Gave contact information to their office:  [facility] [address] [place], [place] [phone] She is agreeable to plan. Advised that this referral was for second opinion as patient has requested.   Routing to provider for final review. Patient agreeable to disposition. Will close encounter

## 2014-03-29 NOTE — Telephone Encounter (Signed)
Patient states when she picked up her pain meds, they were not the same as she been taking. She was not told that meds would be changed and would like to speak to Dr McDiarmid/nurse about this.

## 2014-03-29 NOTE — Telephone Encounter (Signed)
Will forward to MD.  It looks like she was given the same prescriptions that she asked for.  Jazmin Hartsell,CMA

## 2014-03-29 NOTE — Telephone Encounter (Signed)
Patient's daughter Suzi Roots "[name]" calling regarding recent referral to [facility]. Charlen has some questions regarding the referral. Release signed in chart to talk to [facility].

## 2014-03-31 NOTE — Telephone Encounter (Signed)
Pt called because the pain medication that she is suppose to get 7.5 mg and not the 3-325 mg and doesn't understand why she was changed please call and clarify this information. Please call [phone]. jw

## 2014-04-01 NOTE — Telephone Encounter (Signed)
Ask patient to take 1 and half tablets of 5/325 mg pills.   Dr Roseanne Juenger will change prescription so she can have 7.5 mg tablet next time she needs a refill.

## 2014-04-01 NOTE — Telephone Encounter (Signed)
Pt is aware of this change.  Will bring old presciptions back that she hasn't filled when she is due for the next refill. Jazmin Hartsell,CMA

## 2014-04-19 ENCOUNTER — Telehealth: Payer: Self-pay | Admitting: Family Medicine

## 2014-04-19 NOTE — Telephone Encounter (Signed)
RX for hydorcodene was writthen wrong P;ease advive

## 2014-04-19 NOTE — Telephone Encounter (Signed)
Spoke with patient and her daughter is supposed to bring by the old rx that we discussed on 04-01-14.  Informed her that Dr. Wendy Poet is not here now and that she might have to sit for a little while we wait for another faculty MD to change this rx for her.  Pt is aware. Jazmin Hartsell,CMA

## 2014-04-20 ENCOUNTER — Other Ambulatory Visit: Payer: Self-pay | Admitting: Family Medicine

## 2014-04-20 MED ORDER — HYDROCODONE-ACETAMINOPHEN 7.5-325 MG PO TABS: 1.0000 | ORAL_TABLET | Freq: Four times a day (QID) | ORAL | Status: DC | PRN

## 2014-04-20 NOTE — Telephone Encounter (Signed)
Pt came in today to exchange her prescription.  Rewritten by Dr. Gwendlyn Deutscher and given to her daughter [name]. [employer]

## 2014-04-28 DIAGNOSIS — R339 Retention of urine, unspecified: Secondary | ICD-10-CM

## 2014-04-28 DIAGNOSIS — N39490 Overflow incontinence: Secondary | ICD-10-CM | POA: Insufficient documentation

## 2014-04-28 HISTORY — DX: Retention of urine, unspecified: R33.9

## 2014-04-28 HISTORY — DX: Overflow incontinence: N39.490

## 2014-05-11 ENCOUNTER — Ambulatory Visit (INDEPENDENT_AMBULATORY_CARE_PROVIDER_SITE_OTHER): Payer: Medicare Other | Admitting: Family Medicine

## 2014-05-11 ENCOUNTER — Encounter: Payer: Self-pay | Admitting: Family Medicine

## 2014-05-11 VITALS — BP 126/80 | HR 77 | Temp 98.2°F | Ht 59.0 in | Wt 152.0 lb

## 2014-05-11 DIAGNOSIS — N390 Urinary tract infection, site not specified: Secondary | ICD-10-CM

## 2014-05-11 DIAGNOSIS — R32 Unspecified urinary incontinence: Secondary | ICD-10-CM

## 2014-05-11 LAB — POCT URINALYSIS DIPSTICK
BILIRUBIN UA: NEGATIVE
Glucose, UA: NEGATIVE
KETONES UA: NEGATIVE
Nitrite, UA: POSITIVE
Protein, UA: NEGATIVE
Spec Grav, UA: 1.015
Urobilinogen, UA: 0.2
pH, UA: 6

## 2014-05-11 LAB — POCT UA - MICROSCOPIC ONLY

## 2014-05-11 MED ORDER — HYDROCODONE-ACETAMINOPHEN 7.5-325 MG PO TABS: 1.0000 | ORAL_TABLET | Freq: Four times a day (QID) | ORAL | Status: DC | PRN

## 2014-05-11 MED ORDER — BETHANECHOL CHLORIDE 10 MG PO TABS: 10.0000 mg | ORAL_TABLET | Freq: Three times a day (TID) | ORAL | Status: DC

## 2014-05-11 MED ORDER — CEPHALEXIN 500 MG PO CAPS: 500.0000 mg | ORAL_CAPSULE | Freq: Four times a day (QID) | ORAL | Status: DC

## 2014-05-11 NOTE — Assessment & Plan Note (Signed)
Acute on chronic flare of worsening likely overflow incontinence, suspected due to acute uncomplicated UTI (afebrile, no n/v, flank pain), probably caused by urinary retention with incomplete emptying. Complicated by hx of significant cystocele, prior bladder wall surgeries. - Followed by Urology, GYN - UA (pt obtained sample, +nitrite,+sml leuk, WBC TNTC)  Plan: 1. Empiric antibiotic treatment with Keflex 500mg  QID x 7 days 2. Urine culture and gram stain 3. Rx trial of Bethanechol 10mg  TID - cholinergic to assist with bladder wall detrusor contractions to help pt void more completely. However, concern with cystocele and complicated bladder wall integrity. Advised patient to discuss this medication further with her Urologist. 4. Keep f/u [facility] 05/20/14 5. RTC if symptoms worse in 1-2 weeks

## 2014-05-11 NOTE — Progress Notes (Signed)
Subjective:     Patient ID: Nicole Perez, female   DOB: Jul 25, 1933, 80 y.o.   MRN: [id number]  Patient presents for a same day appointment.  HPI  URINARY FREQUENCY / INCONTINENCE: - Reported symptoms started to worsen on Sunday night (05/09/14), woke up 4-5x overnight to change pads due to urinary incontinence. Reported constant leakage with urinary incontinence since this time. Denies any urge to void. Patient reports that her Urologist told her that her bladder "continues to empty and empty itself, but it leaves some urine in there, and that is what causes infection". Previously dx with overflow incontinence. States that she wants to catch a potential infection "early", because she does not feel as sick as she did back in March.  Additionally, patient is almost out of current Norco prescription, requesting refill.  Of note: Last hospitalized 03/346 for complicated pyelonephritis with systemic symptoms, completed antibiotic treatment, culture without significant growth.  Admits suprapubic discomfort. Denies fever/chills, nausea / vomiting, acute back / flank pain, dysuria, hematuria.   PMH / Care coordination: - GYN: Followed by Dr. Sabra Heck, recent course with pessary (since spontaneously fallen out) - [facility]rology [facility] office) - recent urodynamic studies, reported follow-up 05/20/2014. Rx antibiotic for UTI, since completed (last apt about 3 weeks ago).  Surgical Hx: - Bladder "tack" x 2 (> 20 years ago) - similar problem  I have reviewed and updated the following as appropriate: allergies and current medications  Social Hx: Former smoker (quit 1985)   Review of Systems  See above HPI.    Objective:   Physical Exam  BP 126/80  Pulse 77  Temp(Src) 98.2 F (36.8 C) (Oral)  Ht 4\' 11"  (1.499 m)  Wt 152 lb (68.947 kg)  BMI 30.68 kg/m2  LMP 12/17/1962  Gen - elderly female, well-appearing, uses rolling walker, NAD HEENT - MMM Heart - RRR, no murmurs heard Abd - soft,  +suprapubic tenderness, otherwise non-tender, non-distended, no masses, +active BS MSK - no CVAT, back non-tender to palpation Ext - non-tender, no edema, peripheral pulses intact +2 b/l Skin - warm, dry, no rashes Neuro - awake, alert, oriented, grossly non-focal     Assessment:     See specific A&P problem list for details.      Plan:     See specific A&P problem list for details.

## 2014-05-11 NOTE — Assessment & Plan Note (Signed)
Suspected UTI, with significant UA, ordered Ur cx and gram stain. Keflex 500mg  QID x 7 days, see details on presentation under "Urinary Incontinence" problem list.

## 2014-05-11 NOTE — Patient Instructions (Signed)
Dear Nicole Perez, Thank you for coming in to clinic today. [name] (sorry you aren't feeling well)  Today we discussed your Urinary Frequency / Incontinence. 1. It looks like your urine is infected, I will go ahead and send in prescription for Keflex (antibiotic), please take it 4 times a day for the next 7 days. 2. We will run a few other tests on your urine, and our clinic will notify you if we need to change your antibiotic course. 3. Your urinary incontinence is most likely from the Overflow incontinence. I have sent a prescription for Bethanecol, which will help your bladder empty all of your urine and may help some.  - Take one 10mg  tablet 3 times a day for the next month. Please discuss this further with your Urologist at your next apt June 4th 4. I have refilled your Norco 7.5mg  tabs, please discuss this further with your primary doctor at your next visit.  Please schedule a follow-up appointment with your primary doctor if you do not improve within 1 to 2 weeks.  If you have any other questions or concerns, please feel free to call the clinic to contact me. You may also schedule an earlier appointment if necessary.  However, if your symptoms get significantly worse, please go to the Emergency Department to seek immediate medical attention.  Nobie Putnam, [place]

## 2014-05-12 LAB — GRAM STAIN

## 2014-05-13 ENCOUNTER — Telehealth: Payer: Self-pay | Admitting: Family Medicine

## 2014-05-13 NOTE — Telephone Encounter (Signed)
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 Discussed concerns with Dr. Andria Frames, and decided to advise patient to hold on taking Bethanecol, given concern for how cholinergic agent to increase contractility of bladder could cause urinary obstruction with her complex history. Plan to have patient stop taking prescription, and bring it to her Urologist follow-up apt on 05/20/14 to discuss options.  Called patient, spoke directly with Nicole Perez to discuss effect of starting new medications (Bethanecol and Keflex). She reports that she has noticed overall improvement on the Keflex. For the Bethanecol, she has only taken a dose last night and one dose today, does not report any significant change in the amount of urine emptied out of her bladder. I advised her to stop taking it currently, keep the medicine and bring it to her Urologist to discuss the potential risks/benefits further. Advised to complete current course of Keflex. Answered her questions.  Nobie Putnam, [place], PGY-1

## 2014-05-15 LAB — URINE CULTURE: Colony Count: >=100000

## 2014-05-17 ENCOUNTER — Telehealth: Payer: Self-pay | Admitting: Family Medicine

## 2014-05-17 DIAGNOSIS — N390 Urinary tract infection, site not specified: Secondary | ICD-10-CM

## 2014-05-17 MED ORDER — CIPROFLOXACIN HCL 250 MG PO TABS: 250.0000 mg | ORAL_TABLET | Freq: Two times a day (BID) | ORAL | Status: DC

## 2014-05-17 NOTE — Telephone Encounter (Signed)
Last OV 05/11/14, at that time dx with UTI pending urine culture. Currently reviewed Urine Culture results (+Klebsiella and Enterobacter), patient initially prescribed empiric Keflex 500mg  QID x 7 days, upon review of sensitivities, Klebsiella is sensitive to cephalosporin, however Enterobacter is resistant to Cefazolin. Both bacteria are sensitive to fluoroquinolones. Ordered Cipro 250mg  PO BID x 7 days for patient to complete course for UTI, new rx was electronically sent to pt's pharmacy.  Attempted to call patient at number listed on chart, no answer and no voicemail to leave message.  If you could please, attempt to contact patient on 05/18/14 to relay this information. Advise her that based on urine culture we need to add a new antibiotic, already sent in Cipro as prescribed for 7 days, please pick up and start taking. She may discuss this with her Urologist on 05/20/14 at her upcoming appointment.  Thank you, Nobie Putnam, [place], PGY-1

## 2014-05-20 NOTE — Telephone Encounter (Signed)
Spoke with patient and informed her of below. She has already picked up rx and started taking

## 2014-05-26 ENCOUNTER — Telehealth: Payer: Self-pay | Admitting: Family Medicine

## 2014-06-07 ENCOUNTER — Telehealth: Payer: Self-pay | Admitting: Family Medicine

## 2014-06-07 NOTE — Telephone Encounter (Signed)
Pt called and needs a refill on her hydrocodone left up front. Please call when ready. jw

## 2014-06-08 MED ORDER — HYDROCODONE-ACETAMINOPHEN 7.5-325 MG PO TABS: 1.0000 | ORAL_TABLET | Freq: Four times a day (QID) | ORAL | Status: DC | PRN

## 2014-06-08 NOTE — Telephone Encounter (Signed)
Placed up front for patient to pick up and she is aware. Jazmin Hartsell,CMA

## 2014-06-08 NOTE — Telephone Encounter (Signed)
Printed and given to [employer]

## 2014-06-09 ENCOUNTER — Telehealth: Payer: Self-pay | Admitting: Obstetrics & Gynecology

## 2014-06-09 NOTE — Telephone Encounter (Signed)
Routing to Dr. Sabra Heck for update.   [name] will you cancel appointment and reschedule if they would like to? They can schedule at the patient's convenience or call back when they would like to schedule with Dr. Sabra Heck.

## 2014-06-09 NOTE — Telephone Encounter (Signed)
Called to schedule IAWV. When pt returns call please schedule appt for 60 minutes.

## 2014-06-09 NOTE — Telephone Encounter (Signed)
Pt's daughter called regarding pt's appointment for Friday. They would like to wait to see Dr Sabra Heck after pt is finished with seeing the Dr in [facility] that she was referred to. Told her that Dr Sabra Heck would not be in until Thursday but would let nurse know.

## 2014-06-10 NOTE — Telephone Encounter (Signed)
Patient daughter called and canceled the appt said she will call back to reschedule when they get the results from the dr in [place]

## 2014-06-11 ENCOUNTER — Ambulatory Visit: Payer: Medicare Other | Admitting: Obstetrics & Gynecology

## 2014-06-23 ENCOUNTER — Other Ambulatory Visit: Payer: Self-pay | Admitting: Student

## 2014-06-23 DIAGNOSIS — R42 Dizziness and giddiness: Secondary | ICD-10-CM

## 2014-06-23 DIAGNOSIS — R200 Anesthesia of skin: Secondary | ICD-10-CM

## 2014-06-30 ENCOUNTER — Inpatient Hospital Stay: Admission: RE | Admit: 2014-06-30 | Payer: Medicare Other | Source: Ambulatory Visit

## 2014-06-30 ENCOUNTER — Telehealth: Payer: Self-pay | Admitting: Family Medicine

## 2014-06-30 NOTE — Telephone Encounter (Signed)
Need refill for the hydrocodone.  Daughter [name] called in.

## 2014-07-01 ENCOUNTER — Ambulatory Visit (INDEPENDENT_AMBULATORY_CARE_PROVIDER_SITE_OTHER): Payer: Medicare Other | Admitting: Family Medicine

## 2014-07-01 ENCOUNTER — Encounter: Payer: Self-pay | Admitting: Family Medicine

## 2014-07-01 VITALS — BP 112/70 | Temp 97.5°F | Ht 59.0 in | Wt 150.0 lb

## 2014-07-01 DIAGNOSIS — N3000 Acute cystitis without hematuria: Secondary | ICD-10-CM

## 2014-07-01 DIAGNOSIS — M150 Primary generalized (osteo)arthritis: Secondary | ICD-10-CM

## 2014-07-01 DIAGNOSIS — M159 Polyosteoarthritis, unspecified: Secondary | ICD-10-CM

## 2014-07-01 DIAGNOSIS — M549 Dorsalgia, unspecified: Secondary | ICD-10-CM

## 2014-07-01 DIAGNOSIS — N390 Urinary tract infection, site not specified: Secondary | ICD-10-CM

## 2014-07-01 LAB — POCT URINALYSIS DIPSTICK
Bilirubin, UA: NEGATIVE
Glucose, UA: NEGATIVE
Ketones, UA: NEGATIVE
Nitrite, UA: POSITIVE
PROTEIN UA: NEGATIVE
SPEC GRAV UA: 1.01
Urobilinogen, UA: 0.2
pH, UA: 6

## 2014-07-01 LAB — POCT UA - MICROSCOPIC ONLY

## 2014-07-01 MED ORDER — CIPROFLOXACIN HCL 250 MG PO TABS: 500.0000 mg | ORAL_TABLET | Freq: Two times a day (BID) | ORAL | Status: DC

## 2014-07-01 MED ORDER — HYDROCODONE-ACETAMINOPHEN 7.5-325 MG PO TABS: 1.0000 | ORAL_TABLET | Freq: Four times a day (QID) | ORAL | Status: DC | PRN

## 2014-07-01 NOTE — Assessment & Plan Note (Deleted)
Patient requesting refill on norco. Dr McDiarmid was in clinic this morning and I discussed this with him. He notes the patient has requested this refill recently and he was going to refill this medication this after noon. He asks that I fill it for her today. Refill given of norco today.

## 2014-07-01 NOTE — Assessment & Plan Note (Signed)
Patient with symptoms of UTI and UA findings of UTI. Will send culture of urine. Prior recent history of klebsiella and enterobacter in urine. Both sensitive to cipro. Will treat as complicated UTI given self cathing. Cipro 500 mg BID for 7 days. F/u prn.

## 2014-07-01 NOTE — Assessment & Plan Note (Signed)
Patient requesting refill on norco. Dr McDiarmid was in clinic this morning and I discussed this with him. He notes the patient has requested this refill recently and he was going to refill this medication this after noon. He asks that I fill it for her today. Refill given of norco today.

## 2014-07-01 NOTE — Progress Notes (Signed)
Patient ID: Nicole Perez, female   DOB: February 19, 1933, 80 y.o.   MRN: [id number]  Tommi Rumps, MD Phone: [phone]  Nicole Perez is a 80 y.o. female who presents today for same day appointment.  Dysuria: patient reports dysuria. Notes suprapubic discomfort as well. No radiation of discomfort. She has a history of incomplete bladder emptying and incontinence. She has been followed by OB-GYN at [facility] in [place] for this issue. It sounds as though they have done urodynamic studies and per patient report they do not have a cause yet. They are scheduling her for MRI of her brain and spine to see if there is anything neurological going on to cause this issue. As a result of this issue she has been cathing her self daily. She is supposed to do this BID, though only does this once a day. She denies bleeding and discharge. She notes she frequently leaks urine and wears depends. She denies fevers.  Patient is a former smoker.   ROS: Per HPI   Physical Exam Filed Vitals:   07/01/14 1001  BP: 112/70  Temp: 97.5 F (36.4 C)    Gen: Well NAD Lungs: CTABL Nl WOB Heart: RRR no MRG Abd: soft, mild suprapubic tenderness, non-tender elsewhere, ND Exts: Non edematous BL  LE, warm and well perfused.    Assessment/Plan: Please see individual problem list.  # Healthcare maintenance: up to date

## 2014-07-01 NOTE — Patient Instructions (Signed)
Nice to meet you. You do have a urinary tract infection. We will treat you with an antibiotic called ciprofloxacin. We will culture your urine to make sure we are treating with the correct antibiotic. If you develop fever, nausea, vomiting, chills, worsening pain, or a change in the site of your pain please seek medical care.

## 2014-07-05 ENCOUNTER — Telehealth: Payer: Self-pay | Admitting: Family Medicine

## 2014-07-05 DIAGNOSIS — R32 Unspecified urinary incontinence: Secondary | ICD-10-CM

## 2014-07-05 LAB — URINE CULTURE

## 2014-07-05 MED ORDER — CEPHALEXIN 500 MG PO CAPS: 500.0000 mg | ORAL_CAPSULE | Freq: Four times a day (QID) | ORAL | Status: DC

## 2014-07-05 NOTE — Telephone Encounter (Signed)
Attempted phone call, again received VM.  Will attempt again later. Gelene Recktenwald, [name]

## 2014-07-05 NOTE — Telephone Encounter (Signed)
Message delivered to pt

## 2014-07-05 NOTE — Telephone Encounter (Signed)
Called patient to inform her of the results of her urine culture. Her previous culture from May grew out enterobacter and klebsiella that were sensitive to ciprofloxacin. I started her on cipro this past Thursday based on these urine culture results. Her current culture grew out E coli and this is resistant to cipro and macrobid. It is sensitive to keflex. I have sent this in for the patient. I attempted to call her to inform her of this, though there was no answer. I left a message advising that she needed to be on a different medication and asking her to call the office to confirm she got the message. Could you please call the patient later this morning to see if she got this message? Thanks.

## 2014-07-05 NOTE — Telephone Encounter (Signed)
Thanks  Programmer, systems, [name]

## 2014-07-06 ENCOUNTER — Telehealth: Payer: Self-pay | Admitting: Family Medicine

## 2014-07-06 NOTE — Telephone Encounter (Signed)
Pt has been sent to several drs.  She is not getting any answers about what is wrong. She is very frustrated. She would like to talk to dr mcdiarmid about what may be going on

## 2014-07-06 NOTE — Telephone Encounter (Signed)
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  She thinks that she is to get two MRI (Brain and Spinal cord) but heard from the radiology department that she was only ordered for one MRI test.  I asked Nicole Sawatzky to follow up with Dr Janean Sark office to see that the radiology department has received orders for both MRIs.

## 2014-07-19 ENCOUNTER — Telehealth: Payer: Self-pay | Admitting: Family Medicine

## 2014-07-19 DIAGNOSIS — N39490 Overflow incontinence: Secondary | ICD-10-CM

## 2014-07-19 DIAGNOSIS — R339 Retention of urine, unspecified: Secondary | ICD-10-CM

## 2014-07-19 NOTE — Addendum Note (Signed)
Addended byLissa Morales D on: 07/19/2014 02:33 PM   Modules accepted: Orders, Medications

## 2014-07-19 NOTE — Telephone Encounter (Signed)
Dr. McDiarmid,  Pt will need an order for a home-health RN to do this for her.

## 2014-07-19 NOTE — Telephone Encounter (Signed)
CSW contacted pt's daughter Asa Lente who confirmed that pt needs a HHRN to assist with the catheterization. CSW has sent referral to [place].  Hunt Oris, MSW, [place]

## 2014-07-19 NOTE — Telephone Encounter (Signed)
Requesting PCS through [place]. Patient now has to be catheterized daily and needs someone to come to home and do this.

## 2014-07-19 NOTE — Telephone Encounter (Signed)
I spoke with patient's dgt, [name].  She informs me that she was contacted by her mother's Insurer about getting a nurse to catheterize patient's atonic bladder daily.  Pt is unable to catheterize herself.  I will consult with Hunt Oris, MSW, to help pt's dgt arrange this skilled home service for Ms Caputo.

## 2014-07-23 ENCOUNTER — Telehealth: Payer: Self-pay | Admitting: Family Medicine

## 2014-07-23 DIAGNOSIS — R32 Unspecified urinary incontinence: Secondary | ICD-10-CM

## 2014-07-23 MED ORDER — CEPHALEXIN 500 MG PO CAPS: 500.0000 mg | ORAL_CAPSULE | Freq: Four times a day (QID) | ORAL | Status: DC

## 2014-07-23 NOTE — Telephone Encounter (Signed)
Patient complains of abnormal smelling urine, burning with urination, foul smelling urine, frequency, incomplete bladder emptying, nocturia and pain in the lower abdomen She has had symptoms for 1 week. Patient denies back pain, fever and nausea/vomiting.. Patient does have a history of recurrent UTI.  Pt treated for E. Coli (pansensitive)  UTI 07/01/14 with Cipro for 7 days.  Initially improved Cipro until last week when urinary symptoms returned.    Pt with recently diagnosed neurogenic bladder requiring schedule I/O bladder caths.    A/ Possible UTI, complicated P/ Keflex 786 mg QID for 10 days       Pt instructed to contact physician if not improving within 48hours or if      conditions worsens.

## 2014-07-23 NOTE — Telephone Encounter (Signed)
Pt would like a refill on her antibiotics for her bladder infection. It is almost cleared up but not all the way. jw

## 2014-07-26 ENCOUNTER — Other Ambulatory Visit: Payer: Self-pay | Admitting: Family Medicine

## 2014-07-26 ENCOUNTER — Telehealth: Payer: Self-pay | Admitting: Family Medicine

## 2014-07-26 MED ORDER — CEPHALEXIN 500 MG PO CAPS: 500.0000 mg | ORAL_CAPSULE | Freq: Four times a day (QID) | ORAL | Status: DC

## 2014-07-26 MED ORDER — HYDROCODONE-ACETAMINOPHEN 7.5-325 MG PO TABS: 1.0000 | ORAL_TABLET | Freq: Four times a day (QID) | ORAL | Status: DC | PRN

## 2014-07-26 NOTE — Telephone Encounter (Signed)
Pt called stating that pharmacy never received antibiotic for UTI symptoms.  Cephalexin 500 mg Capsule marked at print.  Rx resent to pharmacy.  Derl Barrow, RN

## 2014-07-26 NOTE — Telephone Encounter (Signed)
Refill request for hydrocodone. Call for pick up.

## 2014-07-26 NOTE — Progress Notes (Signed)
Pt informed. Fleeger, Jessica Dawn  

## 2014-07-26 NOTE — Progress Notes (Signed)
Please let patient know her prescription(s) are available for pick up from the [facility] front desk.  

## 2014-08-02 ENCOUNTER — Telehealth: Payer: Self-pay | Admitting: Family Medicine

## 2014-08-02 NOTE — Telephone Encounter (Signed)
Verbal order given to [facility].  She will get orthostatic vitals on patient.  States that patient informed her she has not been taking the hydrocodone but will verify.  Pt is taking ibuprofen as needed.  She will call office back with this information.  I also informed her that patient was just written on 07-26-14. Jazmin Hartsell,CMA

## 2014-08-02 NOTE — Telephone Encounter (Signed)
Anderson Malta from [place] calls in an incident report. Pt fell yesterday at 5 pm  coming out of her bathroom and skinned her knee. Anderson Malta is requesting verbal orders for PT to evaluate and treat. Pls call Anderson Malta at [phone].

## 2014-08-02 NOTE — Telephone Encounter (Signed)
Please give verbal orders for [place] home PT for Evaluation and treatment of abnormal gait and Falls, as well as a home safety evaluation by PT.  [place] Nurse   to check patient's orthostatic BP and pulse   Assess how much hydrocodone/APAP patient is taking a day  Assess how much Ibuprofen patient is taking a day.   Call orthostatics and the amount of these medications being used daily.

## 2014-08-05 ENCOUNTER — Other Ambulatory Visit: Payer: Self-pay | Admitting: Family Medicine

## 2014-08-05 ENCOUNTER — Telehealth: Payer: Self-pay | Admitting: Family Medicine

## 2014-08-05 ENCOUNTER — Ambulatory Visit (INDEPENDENT_AMBULATORY_CARE_PROVIDER_SITE_OTHER): Payer: Medicare Other | Admitting: Family Medicine

## 2014-08-05 VITALS — BP 148/75 | HR 92 | Temp 98.2°F | Ht 59.0 in | Wt 150.0 lb

## 2014-08-05 DIAGNOSIS — R413 Other amnesia: Secondary | ICD-10-CM

## 2014-08-05 DIAGNOSIS — I10 Essential (primary) hypertension: Secondary | ICD-10-CM

## 2014-08-05 DIAGNOSIS — W19XXXA Unspecified fall, initial encounter: Secondary | ICD-10-CM

## 2014-08-05 DIAGNOSIS — Z9181 History of falling: Secondary | ICD-10-CM

## 2014-08-05 DIAGNOSIS — R339 Retention of urine, unspecified: Secondary | ICD-10-CM

## 2014-08-05 DIAGNOSIS — G609 Hereditary and idiopathic neuropathy, unspecified: Secondary | ICD-10-CM

## 2014-08-05 DIAGNOSIS — R202 Paresthesia of skin: Secondary | ICD-10-CM

## 2014-08-05 DIAGNOSIS — G629 Polyneuropathy, unspecified: Secondary | ICD-10-CM

## 2014-08-05 DIAGNOSIS — R209 Unspecified disturbances of skin sensation: Secondary | ICD-10-CM

## 2014-08-05 DIAGNOSIS — I951 Orthostatic hypotension: Secondary | ICD-10-CM

## 2014-08-05 DIAGNOSIS — R29898 Other symptoms and signs involving the musculoskeletal system: Secondary | ICD-10-CM

## 2014-08-05 LAB — COMPREHENSIVE METABOLIC PANEL
ALBUMIN: 4.3 g/dL (ref 3.5–5.2)
ALT: 9 U/L (ref 0–35)
AST: 16 U/L (ref 0–37)
Alkaline Phosphatase: 60 U/L (ref 39–117)
BUN: 23 mg/dL (ref 6–23)
CHLORIDE: 98 meq/L (ref 96–112)
CO2: 26 meq/L (ref 19–32)
Calcium: 9.1 mg/dL (ref 8.4–10.5)
Creat: 0.82 mg/dL (ref 0.50–1.10)
Glucose, Bld: 101 mg/dL — ABNORMAL HIGH (ref 70–99)
Potassium: 3.6 mEq/L (ref 3.5–5.3)
Sodium: 137 mEq/L (ref 135–145)
TOTAL PROTEIN: 7.4 g/dL (ref 6.0–8.3)
Total Bilirubin: 0.5 mg/dL (ref 0.2–1.2)

## 2014-08-05 LAB — CBC
HEMATOCRIT: 33.6 % — AB (ref 36.0–46.0)
Hemoglobin: 11.3 g/dL — ABNORMAL LOW (ref 12.0–15.0)
MCH: 28 pg (ref 26.0–34.0)
MCHC: 33.6 g/dL (ref 30.0–36.0)
MCV: 83.4 fL (ref 78.0–100.0)
Platelets: 253 10*3/uL (ref 150–400)
RBC: 4.03 MIL/uL (ref 3.87–5.11)
RDW: 13.8 % (ref 11.5–15.5)
WBC: 5.3 10*3/uL (ref 4.0–10.5)

## 2014-08-05 LAB — VITAMIN B12: Vitamin B-12: 497 pg/mL (ref 211–911)

## 2014-08-05 LAB — TSH: TSH: 2.295 u[IU]/mL (ref 0.350–4.500)

## 2014-08-05 LAB — POCT SEDIMENTATION RATE: POCT SED RATE: 45 mm/hr — AB (ref 0–22)

## 2014-08-05 LAB — POCT GLYCOSYLATED HEMOGLOBIN (HGB A1C): Hemoglobin A1C: 5.7

## 2014-08-05 NOTE — Patient Instructions (Signed)
Please find out which Podiatrist saw Nicole Perez for her right foot pain recently. Ask the Podiatrist's office to send record of her offices visits to Dr McDiarmid's office.   We are going to schedule you to be seen in the Geriatric Clinic with Dr McDiarmid in 3 to 4 weeks to check on your memory and falls and weakness.   We are checking blood work for the pain and weakness in your feet.   Dr McDiarmid will be very interested in what the MRI shows.

## 2014-08-05 NOTE — Telephone Encounter (Signed)
Danielle from [facility] requesting PT for patient 1 x this week and 2 x for 6 weeks. Please call Danielle with orders [phone].

## 2014-08-06 ENCOUNTER — Encounter: Payer: Self-pay | Admitting: Family Medicine

## 2014-08-06 ENCOUNTER — Ambulatory Visit
Admission: RE | Admit: 2014-08-06 | Discharge: 2014-08-06 | Disposition: A | Discharge status: Home / Self Care | Payer: Medicare Other | Source: Ambulatory Visit | Attending: *Deleted | Admitting: *Deleted

## 2014-08-06 DIAGNOSIS — R413 Other amnesia: Secondary | ICD-10-CM

## 2014-08-06 DIAGNOSIS — Z9181 History of falling: Secondary | ICD-10-CM

## 2014-08-06 DIAGNOSIS — R200 Anesthesia of skin: Secondary | ICD-10-CM

## 2014-08-06 DIAGNOSIS — R29898 Other symptoms and signs involving the musculoskeletal system: Secondary | ICD-10-CM | POA: Insufficient documentation

## 2014-08-06 DIAGNOSIS — R4189 Other symptoms and signs involving cognitive functions and awareness: Secondary | ICD-10-CM | POA: Insufficient documentation

## 2014-08-06 DIAGNOSIS — R42 Dizziness and giddiness: Secondary | ICD-10-CM

## 2014-08-06 DIAGNOSIS — R296 Repeated falls: Secondary | ICD-10-CM | POA: Insufficient documentation

## 2014-08-06 DIAGNOSIS — G629 Polyneuropathy, unspecified: Secondary | ICD-10-CM

## 2014-08-06 DIAGNOSIS — F0150 Vascular dementia without behavioral disturbance: Secondary | ICD-10-CM

## 2014-08-06 DIAGNOSIS — F0280 Dementia in other diseases classified elsewhere without behavioral disturbance: Secondary | ICD-10-CM

## 2014-08-06 DIAGNOSIS — G609 Hereditary and idiopathic neuropathy, unspecified: Secondary | ICD-10-CM | POA: Insufficient documentation

## 2014-08-06 DIAGNOSIS — I951 Orthostatic hypotension: Secondary | ICD-10-CM

## 2014-08-06 DIAGNOSIS — W19XXXA Unspecified fall, initial encounter: Secondary | ICD-10-CM | POA: Insufficient documentation

## 2014-08-06 DIAGNOSIS — R202 Paresthesia of skin: Secondary | ICD-10-CM

## 2014-08-06 DIAGNOSIS — G63 Polyneuropathy in diseases classified elsewhere: Secondary | ICD-10-CM | POA: Insufficient documentation

## 2014-08-06 HISTORY — DX: Dementia in other diseases classified elsewhere, unspecified severity, without behavioral disturbance, psychotic disturbance, mood disturbance, and anxiety: F02.80

## 2014-08-06 HISTORY — DX: Dementia in other diseases classified elsewhere, unspecified severity, without behavioral disturbance, psychotic disturbance, mood disturbance, and anxiety: F01.50

## 2014-08-06 HISTORY — DX: Paresthesia of skin: R20.2

## 2014-08-06 HISTORY — DX: Other symptoms and signs involving the musculoskeletal system: R29.898

## 2014-08-06 HISTORY — DX: Polyneuropathy, unspecified: G62.9

## 2014-08-06 HISTORY — DX: History of falling: Z91.81

## 2014-08-06 HISTORY — DX: Other amnesia: R41.3

## 2014-08-06 HISTORY — DX: Orthostatic hypotension: I95.1

## 2014-08-06 LAB — ANA: ANA: POSITIVE — AB

## 2014-08-06 LAB — RPR

## 2014-08-06 LAB — ANTI-NUCLEAR AB-TITER (ANA TITER): ANA Titer 1: 1:80 {titer} — ABNORMAL HIGH

## 2014-08-06 NOTE — Assessment & Plan Note (Signed)
Suspect secondary to spinal stenosis Prior generalized disease process from Neuro and Rheum. Follow up.

## 2014-08-06 NOTE — Assessment & Plan Note (Signed)
Recent fall With abrasion of shin.  Continue topical wound care RTC in 3 weeks for Geriatric Assesssment clinic for comprehensive evaluation for falls.

## 2014-08-06 NOTE — Assessment & Plan Note (Addendum)
Asymptomatic 30 mmHg drop without compensatory increase heart rate today. Concern for autonomic dysfunction. Risk for falls.  Titrate antihypertensive medications to standingh position.

## 2014-08-06 NOTE — Telephone Encounter (Signed)
Please give authorization to [facility] for PT for patient 1 x this week, and 2 x for 6 weeks.

## 2014-08-06 NOTE — Assessment & Plan Note (Signed)
Satble. Two recent acute cystis episodes treated with oral antibiotics. Bladder cathing only once a day. Hopefully patient can learn how to self-cath Pt following up with Urogynecology in [facility] MRI of spine and brain 08/24/14

## 2014-08-06 NOTE — Telephone Encounter (Signed)
LMOVM for Danielle to call back (VM was unidentifiable so I was not comfortable leaving verbal orders in a message). Almir Botts, [name]

## 2014-08-06 NOTE — Assessment & Plan Note (Addendum)
Orthostatic BP changes with standing. Adequate blood pressure control.  No evidence of new end organ damage.  Tolerating medication without significant adverse effects.  Plan to continue current blood pressure regiment.  Titrate antihypertensive nmendications to standing pressures.

## 2014-08-06 NOTE — Progress Notes (Unsigned)
Patient forgot to ask yesterday at her appointment about getting a handicapped placard form.

## 2014-08-06 NOTE — Progress Notes (Unsigned)
Patient ID: Nicole Perez, female   DOB: [birth year], 80 y.o.   MRN: [id number] Ask patient to bring up at Geriatric Assessment clinic visit in a few weeks.

## 2014-08-06 NOTE — Assessment & Plan Note (Addendum)
Multifactorial. Stable May need an right leg AFO.  Pt reports seeing Podiatrist in last few weks for right foot pain and told that she has a "small fracture" of right forefoot bone (? 4th metatarsal). Told to wear a soft cast.

## 2014-08-06 NOTE — Progress Notes (Signed)
Subjective:    Patient ID: Nicole Perez, female    DOB: 1933/01/21, 80 y.o.   MRN: [id number] Patient accompaneid by Royann Shivers Patient, Asa Lente and EMR and Care Everywhere were sources of information for visit.  HPI Problem List Items Addressed This Visit     High   Incomplete emptying of bladder - Diagnosed in Spring of this year by Dr Ammie Ferrier (GYN in [place]) and Dr Grayling Congress of [facility] Urogynecology in [place]. - Currently patient requiring In/Out bladder cathing.  Recommended that she cath twice a day, but she is unable to cath herself so is getting cath'd only once a day by a niece who is a CNA.   [facility] RN coming out twice a week to cath patient and teach self-cathing - Patient taking bethanecol per Dr Charlesetta Garibaldi ([facility]).  - Patient reports currently not feeling able to catheterize herself, but is open to possibility of learning how.  - Has had three urinary tract infections in last 8 months.  One episode of acute pyelonephritis recquiring hospitalization in January 2015 and two acute cystitis epidoses in last two months.  - Currently without abdominal pain, nausea, fever, increase urgency or incontinence.  - Pt to go for Brain and entire spinal MRI 08/24/14 in workup of the retention as ordered by Dr Charlesetta Garibaldi.      Medium   Essential hypertension, benign (Chronic)  Disease Monitoring  Blood pressure range: not checking at home  Chest pain: no   Dyspnea: no   Claudication: no   Medication compliance: yes, taking Diltiazam XR and HCTZ 12.5 mg dauily.   Medication Side Effects  Lightheadedness: yes, mild with standing   Edema: no    Preventitive Healthcare:  Exercise: no, unable to exercise secondary to weakness in legs   Diet Pattern: salt restriction         Unprioritized   Right leg weakness - Onset in last 1 to 2 months - Worsening course - constant pattern - Associated bilateral painful paresthesia in soles of both feet.  Chronic lumbar back pain present  that is worse with standing.  - feels weak in both thighs and in right ankle. -  Early last week patient Tripped and fell in home while walking. Fall precipitated by stepping on end of right foot sock with left foot which tripped her.  She struck her right knee with abrasion that she has been treating with hydroperoxide and antibiotic ointment.  - Using a rolling walker at home and outside of home.     Paresthesia of both feet - Onset in last year - Pt seen by Dr Rexene Alberts [facility] Neuro) on 10/2013 for  Peroneal nerve disease.  - EMG/NCS 10/20/13 showed decreased peroneal nerve function and chronic lumbar radiculopathy affecting L4 &L5 on the right and possibly affecting S1 on the right.  - Dr Rexene Alberts though right foot decrease in sensation and right foot drop could be multifactorial icnluding traumatic injury to right foot and degenerative back disease.  Dr Rexene Alberts checked for peripheral neuropathy conditions from generalized diseases with blood work and repeat EMG/NCS and lumbar spine MRI - Lumbar MRI 11/05/13 showed Severe Degenerative lumbar disease with severe spinal stenosis at L1-2, L2-3, L3-4.  There is moderate stenosis at T12-L1 and multilevel foraminal stenosis.  There has been progression of degeenrative changes compared to 10/18/09 MRI - Cervical MRI 06/23/14 showed mulilevel cervical spondylosis that has progressed compared to MRI over 10 years prior.   - Dr Rexene Alberts  referred pt to rheumatologist because of postive rheum markers.  N(+) ANA with negative DS-DNA, negative ENA RNP ab, negative SSA ab, negative SSB ab. Normal SPEP and IFE.      EMG/NCS 10/20/13 showed decreased peroneal nerve function and chronic lumbar radiculopathy affecting L4 &L5 on the right and possibly affecting S1 on the right.  - Dr Rexene Alberts though right foot decrease in sensation and right foot drop could be multifactorial icnluding traumatic injury to right foot and degenerative back disease.  Dr Rexene Alberts checked for peripheral  neuropathy conditions from generalized diseases with blood work and repeat EMG/NCS and lumbar spine MRI - Lumbar MRI 11/05/13 showed Severe Degenerative lumbar disease with severe spinal stenosis at L1-2, L2-3, L3-4.  There is moderate stenosis at T12-L1 and multilevel foraminal stenosis.  There has been progression of degeenrative changes compared to 10/18/09 MRI - Cervical MRI 06/23/14 showed mulilevel cervical spondylosis that has progressed compared to MRI over 10 years prior.         Falls - See above   At risk for falls    Other Visit Diagnoses   Peripheral neuropathy    -  Primary    Relevant Orders       TSH (Completed)       Vitamin B12 (Completed)       RPR (Completed)       ANA (Completed)       Serum protein electrophoresis with reflex (Completed)       POCT SEDIMENTATION RATE (Completed)       CBC (Completed)       Comprehensive metabolic panel (Completed)       HgB A1c (Completed)      Past medical history:I have reviewed and confirmed the past medical history in the chart. Medications: reviewed medication list in the chart Allergies: reviewed allergy section in the chart   Review of Systems See HPI     Objective:   Physical Exam  Nursing note and vitals reviewed. Constitutional: No distress.  Cardiovascular: Normal rate, regular rhythm and normal heart sounds.   No murmur heard. Pulmonary/Chest: Effort normal and breath sounds normal. She has no wheezes.  Abdominal: Soft. Bowel sounds are normal. She exhibits no distension. There is no tenderness.  Musculoskeletal: She exhibits no edema.  Psychiatric: She has a normal mood and affect. Her speech is normal and behavior is normal.      Neurological Examination:  Cranial Nerves:  II - Visual fields full to finger confrontation at bedside. Pupils equal and reactive.  III, IV and VI - Extraocular movements intact. No nystagmus.  V - Sensory branches intact.  VII - Face symmetric.  VIII - Hearing intact  bilaterally.  IX, X and XII - Tongue, uvula and palate mid position.  XI - Shoulder shrug is symmetric.  Motor Examination: Normal tone and bulk.  Right Upper Extremity: 5/5.  Left Upper Extremity: 5/5.  Back Exam: no deformity  Sensory change: 3/5 monofilament right foot sole    4/5 monofilament left foot sole Vibration intact grt toes bilaterally Proprioception Intact grt toes bilaterally   Reflex change: Knee DTR +1 bil Ankle (0) bilaterally Babinski downgoing bilaterally  Strength at L foot Plantar-flexion:  5/ 5    Dorsi-flexion: 5 / 5    Eversion:  5/ 5   Inversion: 5 / 5 Leg strength Quad: 4 / 5   Hamstring: 4= / 5   Hip flexor: 4 / 5   Hip abductors: 5 / 5 Gait slow,  shuffle   Strength at R foot Plantar-flexion:  4/ 5    Dorsi-flexion: 3 / 5    Eversion: 3 / 5   Inversion:  4/ 5 Leg strength Quad: 4 / 5   Hamstring:  4/ 5   Hip flexor:  5/ 5   Hip abductors: 5 / 5   Coordination: Finger-to-nose movements good on the right, good on the left.  Patient had no difficulty with rapid alternating movements on the left.  Patient had no difficulty with rapid alternating movements on the right Rhomberg: (+)  shuffling gait with rolling walker. Patient require assistance in standing and balance.        Assessment & Plan:  See Problem list

## 2014-08-06 NOTE — Progress Notes (Signed)
LM for patient to call back.  Please give message from Dr. Wendy Poet.  Thanks [employer]

## 2014-08-06 NOTE — Assessment & Plan Note (Signed)
Failed Minicog (2 recalled objects with abnormal clock) 08/05/14 office visit.  Return 3 weeks for comprehensive geriatric Assessment clinic.

## 2014-08-07 DIAGNOSIS — M47812 Spondylosis without myelopathy or radiculopathy, cervical region: Secondary | ICD-10-CM | POA: Insufficient documentation

## 2014-08-07 HISTORY — DX: Spondylosis without myelopathy or radiculopathy, cervical region: M47.812

## 2014-08-09 LAB — PROTEIN ELECTROPHORESIS, SERUM, WITH REFLEX
ALBUMIN ELP: 50.6 % — AB (ref 55.8–66.1)
Alpha-1-Globulin: 8.9 % — ABNORMAL HIGH (ref 2.9–4.9)
Alpha-2-Globulin: 13.9 % — ABNORMAL HIGH (ref 7.1–11.8)
BETA 2: 4.5 % (ref 3.2–6.5)
BETA GLOBULIN: 6.2 % (ref 4.7–7.2)
Gamma Globulin: 15.9 % (ref 11.1–18.8)
Total Protein, Serum Electrophoresis: 7.4 g/dL (ref 6.0–8.3)

## 2014-08-11 ENCOUNTER — Ambulatory Visit
Admission: RE | Admit: 2014-08-11 | Discharge: 2014-08-11 | Disposition: A | Discharge status: Home / Self Care | Payer: Medicare Other | Source: Ambulatory Visit | Attending: *Deleted | Admitting: *Deleted

## 2014-08-11 ENCOUNTER — Other Ambulatory Visit: Payer: Medicare Other

## 2014-08-11 DIAGNOSIS — R200 Anesthesia of skin: Secondary | ICD-10-CM

## 2014-08-11 DIAGNOSIS — R42 Dizziness and giddiness: Secondary | ICD-10-CM

## 2014-08-11 MED ORDER — GADOBENATE DIMEGLUMINE 529 MG/ML IV SOLN
14.0000 mL | Freq: Once | INTRAVENOUS | Status: AC | PRN
  Administered 2014-08-11: 14 mL via INTRAVENOUS

## 2014-08-17 ENCOUNTER — Other Ambulatory Visit: Payer: Medicare Other

## 2014-08-17 ENCOUNTER — Telehealth: Payer: Self-pay | Admitting: Family Medicine

## 2014-08-17 NOTE — Telephone Encounter (Signed)
Pt needs a refill on her pain medication left up front. jw

## 2014-08-17 NOTE — Telephone Encounter (Signed)
Last Norco refill 07/26/14.  Too early for refill.  May request refill 08/24/14.

## 2014-08-19 ENCOUNTER — Telehealth: Payer: Self-pay | Admitting: Family Medicine

## 2014-08-19 DIAGNOSIS — M47812 Spondylosis without myelopathy or radiculopathy, cervical region: Secondary | ICD-10-CM

## 2014-08-19 NOTE — Telephone Encounter (Signed)
Pt requesting lab results from [facility] 8/20.

## 2014-08-20 ENCOUNTER — Other Ambulatory Visit: Payer: Medicare Other

## 2014-08-24 ENCOUNTER — Telehealth: Payer: Self-pay | Admitting: Family Medicine

## 2014-08-24 DIAGNOSIS — M150 Primary generalized (osteo)arthritis: Secondary | ICD-10-CM

## 2014-08-24 DIAGNOSIS — M47812 Spondylosis without myelopathy or radiculopathy, cervical region: Secondary | ICD-10-CM

## 2014-08-24 DIAGNOSIS — M169 Osteoarthritis of hip, unspecified: Secondary | ICD-10-CM

## 2014-08-24 DIAGNOSIS — M48061 Spinal stenosis, lumbar region without neurogenic claudication: Secondary | ICD-10-CM

## 2014-08-24 DIAGNOSIS — M4804 Spinal stenosis, thoracic region: Secondary | ICD-10-CM

## 2014-08-24 DIAGNOSIS — M1990 Unspecified osteoarthritis, unspecified site: Secondary | ICD-10-CM

## 2014-08-24 DIAGNOSIS — M159 Polyosteoarthritis, unspecified: Secondary | ICD-10-CM

## 2014-08-24 DIAGNOSIS — M1610 Unilateral primary osteoarthritis, unspecified hip: Secondary | ICD-10-CM

## 2014-08-24 DIAGNOSIS — M549 Dorsalgia, unspecified: Secondary | ICD-10-CM

## 2014-08-24 DIAGNOSIS — M170 Bilateral primary osteoarthritis of knee: Secondary | ICD-10-CM

## 2014-08-24 NOTE — Telephone Encounter (Signed)
Pt returned call.  Below message relayed to pt.    OF NOTE:  MD meant did not show reason, this is what harriet informed pt. Nicole Perez, [name]

## 2014-08-24 NOTE — Telephone Encounter (Signed)
Please let Ms Deniston know the blood work did show an explanation for her bladder problem or painful feet neuropathy.

## 2014-08-24 NOTE — Telephone Encounter (Signed)
LMOVM for pt to return call .Lavonna Lampron Dawn  

## 2014-08-24 NOTE — Telephone Encounter (Signed)
Needs refill on hydrocodone

## 2014-08-25 MED ORDER — HYDROCODONE-ACETAMINOPHEN 7.5-325 MG PO TABS: 1.0000 | ORAL_TABLET | Freq: Four times a day (QID) | ORAL | Status: DC | PRN

## 2014-08-25 NOTE — Telephone Encounter (Signed)
Please let patient know her prescription(s) are available for pick up from the [facility] front desk.  

## 2014-08-25 NOTE — Telephone Encounter (Signed)
LM for patient that rx are ready for pick up at the front desk.  Sae Handrich,CMA

## 2014-08-27 ENCOUNTER — Encounter: Payer: Self-pay | Admitting: Obstetrics & Gynecology

## 2014-08-31 ENCOUNTER — Encounter: Payer: Self-pay | Admitting: Family Medicine

## 2014-08-31 ENCOUNTER — Ambulatory Visit (INDEPENDENT_AMBULATORY_CARE_PROVIDER_SITE_OTHER): Payer: Medicare Other | Admitting: Family Medicine

## 2014-08-31 VITALS — BP 134/81 | HR 76 | Temp 97.8°F | Wt 148.0 lb

## 2014-08-31 DIAGNOSIS — Z9181 History of falling: Secondary | ICD-10-CM

## 2014-08-31 DIAGNOSIS — I10 Essential (primary) hypertension: Secondary | ICD-10-CM

## 2014-08-31 DIAGNOSIS — Z23 Encounter for immunization: Secondary | ICD-10-CM

## 2014-08-31 DIAGNOSIS — R413 Other amnesia: Secondary | ICD-10-CM

## 2014-08-31 MED ORDER — DILTIAZEM HCL ER 180 MG PO CP24: 180.0000 mg | ORAL_CAPSULE | Freq: Every day | ORAL | Status: DC

## 2014-08-31 NOTE — Progress Notes (Signed)
Patient ID: Nicole Perez, female   DOB: 09/24/1933, 80 y.o.   MRN: [id number] [facility]:   Patient is accompanied by: daughter Primary caregiver: mother and daughter History obtained from: mother and daughter Primary Care Provider: Sherren Mocha Philamena Kramar, MD History Chief complaint: falls, concern for memory loss   HPI by problems:   Falls Pt states that she fell about 1 year ago and then stopped driving. She states that she was going to church and walking on a sidewalk when she fell to the right for no apparent reason. She denies dizziness or tripping over an object at bedtime. She had some abrasions but continued on to enjoy her church service. She states that she had stiffness and pain the next day.  Memory Her daughter reports some concern for memory loss. The patient states that she thinks clearly and does not feel that she has any significant deficits in her memory.  Outpatient Encounter Prescriptions as of 08/31/2014  Medication Sig  . bethanechol (URECHOLINE) 10 MG tablet Take 1 tablet (10 mg total) by mouth 3 (three) times daily.  . Catheters (BARD URETHRAL CATHETER TRAY) KIT Use to catheterize daily  . diltiazem (DILACOR XR) 240 MG 24 hr capsule TAKE ONE CAPSULE BY MOUTH ONE TIME DAILY  . hydrochlorothiazide (MICROZIDE) 12.5 MG capsule   . HYDROcodone-acetaminophen (NORCO) 7.5-325 MG per tablet Take 1 tablet by mouth every 6 (six) hours as needed for moderate pain.  Marland Kitchen ibuprofen (ADVIL,MOTRIN) 200 MG tablet Take 400 mg by mouth every 6 (six) hours as needed for mild pain.  . polyethylene glycol (MIRALAX / GLYCOLAX) packet Take 17 g by mouth daily as needed for moderate constipation.   History Patient Active Problem List   Diagnosis Date Noted  . Incomplete emptying of bladder 02/09/2014    Priority: High  . Incontinence overflow, urine 04/28/2014    Priority: Medium  . Cystocele 08/11/2013    Priority: Medium  . Lichen sclerosus et atrophicus of the  vulva     Priority: Medium  . HYPERLIPIDEMIA 05/10/2010    Priority: Medium  . Essential hypertension, benign 04/18/2010    Priority: Medium  . Cervical spondylosis without myelopathy 08/07/2014    Priority: Low  . Osteoarthritis, multiple sites 08/11/2013    Priority: Low  . Osteoarthritis of both knees 04/22/2013    Priority: Low  . Hearing loss sensory, bilateral 07/26/2011    Priority: Low  . Spinal stenosis of thoracic region 07/26/2011    Priority: Low  . Spinal stenosis of lumbar region 07/26/2011    Priority: Low  . DEGENERATIVE JOINT DISEASE, AXIAL SKELETON 04/21/2010    Priority: Low  . INSOMNIA, CHRONIC 04/18/2010    Priority: Low  . BACK PAIN, CHRONIC 04/18/2010    Priority: Low  . DEGENERATIVE JOINT DISEASE, HIPS 09/11/2007    Priority: Low  . Right leg weakness 08/06/2014  . Falls 08/06/2014  . At risk for falls 08/06/2014  . Memory impairment 08/06/2014  . Paresthesia of both feet 08/06/2014  . Orthostatic hypotension 08/06/2014  . Obesity, unspecified 04/22/2013  . Vitamin D deficiency 09/30/2012   Past Medical History  Diagnosis Date  . Essential hypertension, benign 04/18/2010  . Lichen sclerosus et atrophicus of the vulva   . BACK PAIN, CHRONIC 04/18/2010  . DEGENERATIVE JOINT DISEASE, AXIAL SKELETON 04/21/2010  . DEGENERATIVE JOINT DISEASE, HIPS 09/11/2007  . INSOMNIA, CHRONIC 04/18/2010  . Parotid adenoma 1990, 2012    Right parotid, recurrent parotid pleimorphic adenoma.   Marland Kitchen  Spinal stenosis of thoracic region 07/26/2011  . Spinal stenosis of lumbar region 07/26/2011  . Foraminal stenosis of lumbar region 07/26/2011  . Foraminal stenosis of thoracic region 07/26/2011  . Osteoarthritis, multiple sites 08/11/2013  . COLONIC POLYPS, HX OF 12/17/2004    Annotation: polypectomy during colonoscopy in 2006 Qualifier: Diagnosis of  By: Persais Ethridge MD, Nicole Mocha    . Cystocele 08/11/2013  . Fall     recently x3  . Acute renal failure 02/23/2014  . Incontinence 02/09/2014  .  Benign parotid tumor, right   . History of pneumonia 07/26/2011  . Rectal fissure 12/16/2013  . Urinary incontinence in female 02/04/2014  . UTI (urinary tract infection) 02/22/2014  . Right shoulder pain 09/30/2012    11/13 Xray Right shoulder: Right glenohumeral joint degenerative changes with osteophyte  formation along the inferior aspect of the glenoid.  Acromioclavicular joint degenerative changes with bony overgrowth.    Cathleen Fears ACETABULAR LABRAL TEAR BY MRI 09/11/2007    Qualifier: Diagnosis of  By: Festus Pursel MD, Nicole Mocha    . Hearing loss sensory, bilateral 07/26/2011    Sensorineural Hearing Loss, bilateral, Right greater than Left.  Left ear hearing aid b/c work discrimination in       R. ear is very poor. Audiologist-Stephanie Nance at [facility] in [place].  (05/10/2010)   . Osteoarthritis of both knees 04/22/2013    Discussed use of low dose APAP and up to two tablets of hydrocodone/APA 7.5/325 a day as needed for painful exacerbation of knee pain.  Patient had 40 mg Solumedrol with 4 ml 1% lidocaine without epi injected into right knee with anterolateral approach after sterile prep.  No complications.      Marland Kitchen HYPERLIPIDEMIA 05/10/2010    Qualifier: Diagnosis of  By: Elye Harmsen MD, Nicole Mocha    . Incontinence overflow, urine 04/28/2014  . Incomplete emptying of bladder 02/09/2014  . Falls 08/06/2014  . At risk for falls 08/06/2014  . Paresthesia of both feet 08/06/2014  . Orthostatic hypotension 08/06/2014   Past Surgical History  Procedure Laterality Date  . Total abdominal hysterectomy w/ bilateral salpingoophorectomy  1964    Hysterectomy and bilateral oopherectomy at age 39 for benign reasons  . Bladder suspension      Bladder tack x 2 (Dr Janice Norrie)  . Cystocele repair      Rectal prolapse and cyctocele adter hysterectomy requiring anterior repair Jerilynn Mages Edwinna Areola, MD)  . Rectocele repair      Rectal prolapse and cyctocele adter hysterectomy requiring anterior repair Jerilynn Mages Edwinna Areola, MD)  . Urethral dilation    . Parotid gland tumor excision  1990    S/P excision of Right Parotid Gland Benign Tumor, 1990  . Reconstruction of nose  1994    Nasal bridge reconstruction (Dr Judie Grieve, 1994) for following  Forklift accident on job. Surgery complicated by nerve damage resulting in difficulty raising right eyebrow   . Carpal tunnel release  2010    Carpel Tunnel Release of  left wrist  03/2009 (Dr Fredna Dow): Nerve   . Cataract extraction w/ intraocular lens implant    . Breast lumpectomy      Lumpectomy of benign Breast lumps bilaterally, Dr Bubba Camp  . Laminectomy      S/P L4-5 Laminectomy (1987) for decompression of spinal stenosis  . Colonoscopy w/ polypectomy  2006  . Parotidectomy  08/30/11    Radene Journey, MD (ENT) for recurrent right parotid pleomorphic adenoma by frozen section  . Carpal tunnel release  right wrist   Family History  Problem Relation Age of Onset  . Coronary artery disease Mother   . Coronary artery disease Sister     open heart surgery  . Diabetes type II Sister   . Stroke Father 62  . Hypertension Father   . Lupus Brother   . Heart disease Brother   . Heart attack Sister     reports that she quit smoking about 30 years ago. She has never used smokeless tobacco. She reports that she does not drink alcohol or use illicit drugs.  Basic Activities of Daily Living and Instrumental Activities of Daily Living, she has deficits in the following areas:  Comments, transferring, walking only with a walker   She has deficits in her instrumental ADLs including: unable to travel by herself, unable to shop for herself, unable to do any housework  Falls in the past six months:   no  Diet:  general Supplemental shakes:  no  Health Maintenance reviewed: Immunization History  Administered Date(s) Administered  . Influenza Split 09/30/2012  . Influenza,inj,Quad PF,36+ Mos 08/10/2013  . Pneumococcal Conjugate-13 03/11/2014  . Pneumococcal  Polysaccharide-23 12/18/2003  . Td 07/10/2010   Health Maintenance Topics with due status: Overdue     Topic Date Due   INFLUENZA VACCINE 07/17/2014    Diet: Regular Nutritional supplements: None  ROS  per history of present illness  Vital Signs   BP 134/81  Pulse 76  Temp(Src) 97.8 F (36.6 C) (Oral)  Wt 148 lb (67.132 kg)  LMP 12/17/1962  Wt Readings from Last 3 Encounters:  08/05/14 150 lb (68.04 kg)  07/01/14 150 lb (68.04 kg)  05/11/14 152 lb (68.947 kg)    Physical Examination:  VS reviewed GEN: Alert, Cooperative, Groomed, NAD HEENT: NCAT COR: RRR, No M/G/R LUNGS: Clear to auscultation bilaterally, non-labored, speaking in full sentences EXT: No peripheral leg edema SKIN: No lesion nor rashes of face/trunk/extremities  Timed Up & Go Test: Unable to perform Sit-to-Stand Test: 9 / 30-seconds 4-Stage Stand Test:  Feet Side-by-side: No.     Feet Semi-tandem: No.     Feet Tandem: No.     One-Foot Stand:  No.  Gait: Only walks with a walker , no deviation Psych: Normal affect/thought/speech/language  MOCA score: 17/30 Geriatric Depression Scale:  0/15   Labs  Lab Results  Component Value Date   VITAMINB12 497 08/05/2014    Lab Results  Component Value Date   FOLATE 14.4 11/05/2013    Lab Results  Component Value Date   TSH 2.295 08/05/2014    Lab Results  Component Value Date   RPR Reactive* 11/05/2013   RPR 1:2* 11/05/2013      Chemistry      Component Value Date/Time   NA 137 08/05/2014 1027   NA 143 11/05/2013 1437   K 3.6 08/05/2014 1027   CL 98 08/05/2014 1027   CO2 26 08/05/2014 1027   BUN 23 08/05/2014 1027   BUN 14 11/05/2013 1437   CREATININE 0.82 08/05/2014 1027   CREATININE 0.91 02/25/2014 0414      Component Value Date/Time   CALCIUM 9.1 08/05/2014 1027   ALKPHOS 60 08/05/2014 1027   AST 16 08/05/2014 1027   ALT 9 08/05/2014 1027   BILITOT 0.5 08/05/2014 1027       Lab Results  Component Value Date   HGBA1C 5.7 08/05/2014       Lab Results  Component Value Date   WBC 5.3 08/05/2014  HGB 11.3* 08/05/2014   HCT 33.6* 08/05/2014   MCV 83.4 08/05/2014   PLT 253 08/05/2014    No results found for this or any previous visit (from the past 24 hour(s)).  Assessment and Plan: Problem List Items Addressed This Visit   At risk for falls - Primary     Multifactorial including foot drop, spinal stenosis, weakness, polypharmacy (including hydrocodone and NSAIDs), decreased vision, orthostatic hypotension, osteoarthritis Decrease diltiazem 240-180 mg Asked patient to bring in AFO for examination, likely will need refitting Recommend ophthalmology followup and prescription eyeglass adjustment Recommended exercises which were reviewed with patient      Essential hypertension, benign (Chronic)     Some orthostasis today Decrease diltiazem to 240 mg to 180 mg Followup with PCP    Relevant Medications      diltiazem (DILACOR XR) 24 hr capsule   Memory impairment     MOCA score of 17/30 clear ADLs and IADLs deficits No clear reversible source, history of stroke, stigmata of Parkinson's/lewy body/frontal lobe dementia so would consider this alzheimer's type dementia approx stage 5 if we can establish a chronic decline in cognitive function.  Discussed aerobic exercise and group learning activities        Advanced Directives: Code Status:  DNR  Laroy Apple, MD [place] Resident, PGY-3 08/31/2014, 5:31 PM  I have seen and examined this patient with Dr Wendi Snipes. I have discussed with Dr Wendi Snipes.  I agree with their findings and plans as documented in their geriatric consultation note.  Patient is to continue her home Physical Therapy sessions and Home Exercise program for strength and balance.  Advanced Directives and HCPOA documents given to patient.  She wishes to discuss her choices and desired limits of healthcare with her family before completing the documents.   Decreased Diltizaem to  180 mg daily because of the low sitting BP.  Will follow up on next office visit.   Office visit took greater than 60 minutes with over 50% of the time spent in counseling and coordination of care.     Marland Kitchen

## 2014-08-31 NOTE — Assessment & Plan Note (Signed)
MOCA score of 17/30 clear ADLs and IADLs deficits No clear reversible source, history of stroke, stigmata of Parkinson's/lewy body/frontal lobe dementia so would consider this alzheimer's type dementia approx stage 5 if we can establish a chronic decline in cognitive function.  Discussed aerobic exercise and group learning activities

## 2014-08-31 NOTE — Assessment & Plan Note (Signed)
Some orthostasis today Decrease diltiazem to 240 mg to 180 mg Followup with PCP

## 2014-08-31 NOTE — Patient Instructions (Addendum)
Keep doing your daily balance exercises. Do sit-to-stand exercise 10 repetitions twice a day to build thigh muscle strength.   Keep involved in new learning activities to keep your mind active   [facility] [place]  Address :[address]. [place] : [place] Zip : [place] Website : [email or web address] Contact Email : [email or web address] Office Phone : [phone] Information Phone : [phone] Special Notes : [facility]- [phone] [place]- [phone]  [facility] [place] can provide information on local resources including:   Information and Referral (Senior InfoLine) providing seniors, family members, caregivers and others access to information and assistance for the elderly.  Home Delivered Meals (Meals on Wheels)  [place] for those ages 35 and over are offered daytime supervised programs targeted to meet their social, educational, physical and recreational interests. A lunch is also served to those who meet the eligibility criteria.  Caregivers - Volunteer-based program designed to provide seniors with 1-3 hours of service per week. Services include friendly visiting, assistance with grocery shopping, errands, general transportation, etc.  Medical Transportation: Transportation to medical appointments for seniors ages 41 and over who do not receive Medicaid. Funds are occasionally available to transport disabled individuals under 60 who do not receive Medicaid.  [facility] [place] (S.H.I.I.P.): Volunteers are trained by the [place] S.H.I.I.P. office, to provide free information, counseling and education to [employer] beneficiaries and their family members about: Medicare, Medicare Supplements, [place], and [place] Prescription Drug Plans.  Nutritional Supplement Program: Ensure, Ensure Plus, and  Glucerna products are sold at reduced prices. Simply fill out an application.    [place]  Address :[place] : [place] : [place] Zip : [place] Website : [email or web address] Contact Email : [email or web address] Office Phone : [phone] Information Phone : [phone] State Phone : [phone] Regional Phone : [phone] Languages : English Description : A division of the [place] on Aging is responsible for planning, developing, implementing, and coordinating aging services for a region representing 195,000 residents age 66+ in the seven counties of the [place] ([place], [place], [place], [place], [place], [place], [place], [place], [place], [place], [place], [place]). It is a part of a larger aging network created by the Older Americans Act of 1965 dedicated to improving the lives of older Americans nationwide. Hours : 8:30am - 5:00pm, Monday - Friday     State Agency on [place] on Aging  Address :[address] : [place] Zip : [place] Website : [email or web address] Contact Email : [email or web address] Office Phone : [phone] Information Phone : [phone] State Phone : [phone] Languages : English Description : the Division of Aging and Adult Services (DAAS) works to promote independence and enhance the dignity of [place] older adults, persons with disabilities, and their families through a community-based system of opportunities, services, benefits, and protections- to ready younger generations to enjoy their later years- and to help society and government plan and prepare for the changing demographics. Special Notes : Adult Day Care/Health Programs Alzheimer's Disease and Other Dementias Demography/Planning Employment [facility] [facility] Senior Rights Protections Services for Older Adults and People with Disabilities Hours : 8:00 AM - 5:00 PM [place]  [place] is not-for-profit community organization dedicated to providing education, support and services to individuals with dementia and their families. You can learn more about this fine organizations and the help they can provide you and your love one online at www.AbsoluteAndroid.co.nz or by calling [phone], or come by to visit their [place] offices at [place]. You can schedule a free Caregiver's Consultation with an [place] counselor by calling [phone] and ask to schedule an appointment for a Wednesday from 2 to 4 pm at the [facility].   This Caregiver's Consultation can provide the caregiver with information packets and community resources, support groups, individual and family counseling,  conferences and workshops, Respite information and family education.  Please consider taking advantage of this opportunity to learn how you can help your love one, your family and  yourself.

## 2014-08-31 NOTE — Assessment & Plan Note (Addendum)
Multifactorial including foot drop, spinal stenosis, weakness, polypharmacy (including hydrocodone and NSAIDs), decreased vision, orthostatic hypotension, osteoarthritis Decrease diltiazem 240-180 mg Asked patient to bring in AFO for examination, likely will need refitting Recommend ophthalmology followup and prescription eyeglass adjustment Recommended exercises which were reviewed with patient

## 2014-09-07 ENCOUNTER — Telehealth: Payer: Self-pay | Admitting: Family Medicine

## 2014-09-07 NOTE — Telephone Encounter (Signed)
Had flu shot Thursday and has been sick ever since. Cant sleep, diarrhea, no appetitie. Please advise

## 2014-09-07 NOTE — Telephone Encounter (Signed)
No Answer at home phone.  Left message that our office would call back later.  Please try contacting Nicole Perez to  gather details of her illness.   Is she vomiting, having fever/chills, abdominal pain, new dizziness, blood in stool or vomit.   Is it getting better or worse.  What has she already tried for her illness?  Has it helped?  If she is getting worse or vomiting or having fever  Or abdominal pain or  dizziness or blood in her stool or blood in her emesis she should be seen now in clinic or in ED.    +++++++++++++++++++++++++++++++++++++++++++++++++++++++++++++++++++++++++++++++++++++++++++++++++++++++++++++  Nicole Perez called back to office. She feels aching, having sweats, watery non-bloody diarrhea,  No fever, no rigors, no abdominal pain, no dizziness/lightheadedness. No vomiting.  No dysuria. No suprapubic pain.  No new back pain. No change in urine smell or color.  Was improving until return of increased diarrhea today.  Unable to sleep well, increase in EMA frequency Continues to self-catheterize twice a day.  Drinking four quarts of water a day without difficulty. "it runs right through me"  A/ Acute diarrheal illness - Viral enteritis Vs Inactivated Influenza vaccination adverse GI reaction. - No red flags of sepsis or UTI in self-cath pt.  P/ Supportive care Symptomatic medications: Ibuprofen 400 mg TWICE DAILY, Peptobismol Patient may try Melatonin 3 to 5 mg at bedtime. Continue to Push Fluids.  High-risk symptoms requiring immediate evaluation reviewed with patient. Ptto contact our office tomorrow to let us know how she is doing.  She is contact us sooner if her condition worsens.  Pt's dgt, Nicole Perez, will be coming by today to check on her.

## 2014-09-14 ENCOUNTER — Telehealth: Payer: Self-pay | Admitting: Family Medicine

## 2014-09-14 NOTE — Telephone Encounter (Signed)
RX for cathter Supplies from [facility] will be faxed to Dr McDiarmid. Please complete and fax back ASAP

## 2014-09-16 ENCOUNTER — Telehealth: Payer: Self-pay | Admitting: Family Medicine

## 2014-09-16 NOTE — Telephone Encounter (Signed)
Will forward to MD and covering provider. Jazmin Hartsell,CMA  

## 2014-09-16 NOTE — Telephone Encounter (Signed)
Danielle called from [facility] and would like verbal orders for patient to work on her gait and balance. She also reported that the last few visits patients BP has been low and she has been sluggish. Yesterday's BP was 95/50. If you have any questions please call [phone]. [email or web address]

## 2014-09-20 NOTE — Telephone Encounter (Signed)
I spoke with Daryel November [facility] RN.   I ordered home gait and balance training.  I requested that Andee Poles confirm that patient has reduced her Diltiazem XR to 180 mg XR daily. If low BP persists, will likely stop the HCTZ.

## 2014-09-20 NOTE — Telephone Encounter (Signed)
To MD.   Of note, message was not sent to [facility] originally. Joal Eakle, [name]

## 2014-09-20 NOTE — Telephone Encounter (Signed)
Daughter calls again, msg never sent to Dr. Wendy Poet. Pt is completely out of catheters now. Pls send RX to [phone]. Pls call daughter, Asa Lente at [phone].

## 2014-09-21 ENCOUNTER — Telehealth: Payer: Self-pay | Admitting: Family Medicine

## 2014-09-21 NOTE — Telephone Encounter (Signed)
Left message with Ms Winning to stop HCTZ.    I contacted [facility] nurse, Danielle to ask her to follow up whether patient has decreased diltiazem dose to 180 daily and to monitor for patient stopping HCTZ then monitor BP with this change.

## 2014-09-21 NOTE — Telephone Encounter (Signed)
Paperwork ordering cath supplies completed and put in front office to be fax'd.

## 2014-09-21 NOTE — Telephone Encounter (Signed)
[place]: Pt blood pressure was 105/53 after exercise Her back pain yesterday was 8 out 10

## 2014-09-23 ENCOUNTER — Other Ambulatory Visit: Payer: Self-pay | Admitting: Family Medicine

## 2014-09-23 DIAGNOSIS — M549 Dorsalgia, unspecified: Principal | ICD-10-CM

## 2014-09-23 DIAGNOSIS — G8929 Other chronic pain: Secondary | ICD-10-CM

## 2014-09-23 NOTE — Telephone Encounter (Signed)
Needs hydrocodone refilled

## 2014-09-24 MED ORDER — HYDROCODONE-ACETAMINOPHEN 10-325 MG PO TABS: 1.0000 | ORAL_TABLET | Freq: Two times a day (BID) | ORAL | Status: DC | PRN

## 2014-09-24 NOTE — Telephone Encounter (Signed)
Pt is aware of this and will have someone pick it up for her. Jazmin Hartsell,CMA

## 2014-09-24 NOTE — Telephone Encounter (Signed)
Refilled hydrocodone/acetaminophen. Dose of hydrocodone increased from 7.5 mg to 10 mg per tablet to help more with patient's pain.   Please let patient know prescription ready for pick up at [facility] front desk and let her know the dose was increased to help improve her pain control.

## 2014-09-30 ENCOUNTER — Encounter: Payer: Self-pay | Admitting: Family Medicine

## 2014-09-30 ENCOUNTER — Other Ambulatory Visit: Payer: Self-pay | Admitting: Family Medicine

## 2014-09-30 ENCOUNTER — Ambulatory Visit (INDEPENDENT_AMBULATORY_CARE_PROVIDER_SITE_OTHER): Payer: Medicare Other | Admitting: Family Medicine

## 2014-09-30 VITALS — BP 148/70 | HR 58 | Temp 98.5°F | Wt 147.0 lb

## 2014-09-30 DIAGNOSIS — G8929 Other chronic pain: Secondary | ICD-10-CM

## 2014-09-30 DIAGNOSIS — M170 Bilateral primary osteoarthritis of knee: Secondary | ICD-10-CM

## 2014-09-30 DIAGNOSIS — H903 Sensorineural hearing loss, bilateral: Secondary | ICD-10-CM

## 2014-09-30 DIAGNOSIS — M549 Dorsalgia, unspecified: Secondary | ICD-10-CM

## 2014-09-30 DIAGNOSIS — N319 Neuromuscular dysfunction of bladder, unspecified: Secondary | ICD-10-CM

## 2014-09-30 DIAGNOSIS — G5730 Lesion of lateral popliteal nerve, unspecified lower limb: Secondary | ICD-10-CM

## 2014-09-30 DIAGNOSIS — W19XXXD Unspecified fall, subsequent encounter: Secondary | ICD-10-CM

## 2014-09-30 DIAGNOSIS — G608 Other hereditary and idiopathic neuropathies: Secondary | ICD-10-CM

## 2014-09-30 DIAGNOSIS — G629 Polyneuropathy, unspecified: Secondary | ICD-10-CM

## 2014-09-30 DIAGNOSIS — G5731 Lesion of lateral popliteal nerve, right lower limb: Secondary | ICD-10-CM

## 2014-09-30 DIAGNOSIS — I10 Essential (primary) hypertension: Secondary | ICD-10-CM

## 2014-09-30 HISTORY — DX: Lesion of lateral popliteal nerve, unspecified lower limb: G57.30

## 2014-09-30 NOTE — Progress Notes (Signed)
Subjective:    Patient ID: Nicole Perez, female    DOB: [birth year], 80 y.o.   MRN: [id number] Patient accompanied by Royann Shivers for visit Patient, dgt, and EMR source of information for visit HPI Problem List Items Addressed This Visit     High   Bladder neurogenous - New diagnosis from 2014 - Onset after fall with injury of back and right leg in 2014. 11/18/2013 EMG/ENG showed Absent H reflex response c/w possiblity of S1 radiculopathies - Has had workup by UroGyn specialist in [facility] including Spinal MRI that did not show a anatomic cause of the bladder failure.  - Self-cathing twice a day without difficulty - Pt notes variable color and odor  of urine.  Only occasional lower abdominal discomfort that improves with self-cathing of bladder.  No fever/chills.       Medium   Essential hypertension, benign (Chronic) -Disease Monitoring  Blood pressure range: not checking at home   Chest pain: no   Dyspnea: no   Claudication: no   Medication compliance: yes, pt has reduced dose of her Diltiazem to 180 XR daily.  She is not taking HCTZ.   Medication Side Effects  Lightheadedness: no   Urinary frequency: no   Edema: no     Preventitive Healthcare:  Exercise: yes, taking strengthening and balance training at home with [facility] PT.          Low   Osteoarthritis of both knees (Chronic) -Longstanding problem - Treats with topical otc linament with mild improvement in pain - When she takes a Vicodan 10/325 in moring it helps reduce knee and back pain enough to allow her to perform her ADLs and iADLs - Aching in knees interferes with her sleep. - She controls the constipation from opiate with Miralax   Chronic back pain - Longstanding problem - Patient with severe lumbar spinal stenosis and thoracic stenosis. -  When she takes a Vicodan 10/325 in moring it helps reduce knee and back pain enough to allow her to perform her ADLs and iADLs - also takes Ibuprofen (otc) one  tablet daily on average for pain/     Unprioritized      Peripheral painful sensory neuropathy - Progressively worsening over last year.  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  - Pt describes bilateral feet sole burning pain that is worse at night. The discomfort is waking her up from sleep. - Patient has difficulty feeling where her feet are located when she stands.     Falls - No falls since last office visit at [facility] - Receiving and participating with home health PT for strengthening, balance and gait training.  - tolerating the therapy and plans to continue HEP    Foot drop, right - Ongoing problem for at least the last 6 months.  - Pt's brought in her right foot AFO that she has not been wearing because of pain wear stem of AFO is rubbing excessively against her anterior right shin.  -   I have reviewed the patient's medical history/Surgical/medications in detail; additions and changes to the history as noted in the electronic medical record. No smoking  Patient continues to live alone independently.  Review of Systems See HPI No diarrhea  No fever.  No weight loss (+) trouble hearing voice from pulpit at [employer] and TV at home despite wearing hearing aid.     Objective:   Physical Exam VS reviewed GEN: Alert, Cooperative, Groomed, NAD, Wearing her left ear hearing aid HEENT: PERRL; EAC bilaterally not occluded, TM's translucent with normal LM, (+) LR;              No cervical LAN, No thyromegaly, No palpable masses Patient unable to hear whispered number / letter combination despite wearing left in-the-ear hearing aid.  COR: RRR, No M/G/R, No JVD, Normal PMI size and location LUNGS: BCTA,  No Acc mm use, speaking in full sentences EXT: No peripheral leg edema. Feet without deformity or lesions. Palpable bilateral pedal pulses. Tender over right foot 5th metatarsal head.  No overlying abnormality or erythema.  Able to feel light touch in 9 contact points on both feet soles.   Gait: Using walker No significant path deviation, No step through on swing phase bilaterally,  Psych: Normal affect/thought/speech/language    Assessment & Plan:

## 2014-09-30 NOTE — Assessment & Plan Note (Signed)
Stable. Right AFO not fitting well at right shin, preventing patient from wearing AFO for her right foot drop.  I spoke with [employer] about this concern. They said they would contact patient to adjust the AFO.

## 2014-09-30 NOTE — Assessment & Plan Note (Signed)
Stable. No symptoms of acute cystitis.  Pt able to self cath twice a day.  While no clear explanation of origin of neurogenic bladder, there was absent H reflex of S1 nerve.  Could the causal origin of S1 radiculopathy extend to the S2 to S4 nerves leading to a neurogenic bladder?  Plan is to continue self-catheterization indefinitely.

## 2014-09-30 NOTE — Assessment & Plan Note (Signed)
Stable. Patient participating in home PT for balance and strenthening. Pt demonstrated the HEP she is learning from PT.

## 2014-09-30 NOTE — Assessment & Plan Note (Signed)
New, Improves with nocturnal as needed dose of Vicodan 10/325 adequately to allow patient to return to sleep.  Pt resistant to trial of gabapentin or pregabalin  Recommended trial of Capsaicin cream 0.025 then 0.075% cream four times a day. RTC in  2 months to assess tolerance and response

## 2014-09-30 NOTE — Patient Instructions (Addendum)
Dr Addiel Mccardle will contact [employer]  About your AFO brace.  Contact your eye doctor to get your eyes checked and glasses prescription updated.   For feet Neuropathy pain; Start with Capsaicin 0.025% four times a day for one week.  Then change to Capsaicin 0.075% four times a day continually.  This will decrease the pain signals coming from the injured pain nerves in your feet.   It is ok to take a pain pill at night for the pain in your feet.   Keep taking the Diltiazem 180mg  tablet daily.  Do not take the Hydrochlorothiazide (HCTZ).   Peripheral Neuropathy Peripheral neuropathy is a type of nerve damage. It affects nerves that carry signals between the spinal cord and other parts of the body. These are called peripheral nerves. With peripheral neuropathy, one nerve or a group of nerves may be damaged.  CAUSES  Many things can damage peripheral nerves. For some people with peripheral neuropathy, the cause is unknown. Some causes include:  Diabetes. This is the most common cause of peripheral neuropathy.  Injury to a nerve.  Pressure or stress on a nerve that lasts a long time.  Too little vitamin B. Alcoholism can lead to this.  Infections.  Autoimmune diseases, such as multiple sclerosis and systemic lupus erythematosus.  Inherited nerve diseases.  Some medicines, such as cancer drugs.  Toxic substances, such as lead and mercury.  Too little blood flowing to the legs.  Kidney disease.  Thyroid disease. SIGNS AND SYMPTOMS  Different people have different symptoms. The symptoms you have will depend on which of your nerves is damaged. Common symptoms include:  Loss of feeling (numbness) in the feet and hands.  Tingling in the feet and hands.  Pain that burns.  Very sensitive skin.  Weakness.  Not being able to move a part of the body (paralysis).  Muscle twitching.  Clumsiness or poor coordination.  Loss of balance.  Not being able to control your  bladder.  Feeling dizzy.  Sexual problems. DIAGNOSIS  Peripheral neuropathy is a symptom, not a disease. Finding the cause of peripheral neuropathy can be hard. To figure that out, your health care provider will take a medical history and do a physical exam. A neurological exam will also be done. This involves checking things affected by your brain, spinal cord, and nerves (nervous system). For example, your health care provider will check your reflexes, how you move, and what you can feel.  Other types of tests may also be ordered, such as:  Blood tests.  A test of the fluid in your spinal cord.  Imaging tests, such as CT scans or an MRI.  Electromyography (EMG). This test checks the nerves that control muscles.  Nerve conduction velocity tests. These tests check how fast messages pass through your nerves.  Nerve biopsy. A small piece of nerve is removed. It is then checked under a microscope. TREATMENT   Medicine is often used to treat peripheral neuropathy. Medicines may include:  Pain-relieving medicines. Prescription or over-the-counter medicine may be suggested.  Antiseizure medicine. This may be used for pain.  Antidepressants. These also may help ease pain from neuropathy.  Lidocaine. This is a numbing medicine. You might wear a patch or be given a shot.  Mexiletine. This medicine is typically used to help control irregular heart rhythms.  Surgery. Surgery may be needed to relieve pressure on a nerve or to destroy a nerve that is causing pain.  Physical therapy to help movement.  Assistive  devices to help movement. HOME CARE INSTRUCTIONS   Only take over-the-counter or prescription medicines as directed by your health care provider. Follow the instructions carefully for any given medicines. Do not take any other medicines without first getting approval from your health care provider.  If you have diabetes, work closely with your health care provider to keep your  blood sugar under control.  If you have numbness in your feet:  Check every day for signs of injury or infection. Watch for redness, warmth, and swelling.  Wear padded socks and comfortable shoes. These help protect your feet.  Do not do things that put pressure on your damaged nerve.  Do not smoke. Smoking keeps blood from getting to damaged nerves.  Avoid or limit alcohol. Too much alcohol can cause a lack of B vitamins. These vitamins are needed for healthy nerves.  Develop a good support system. Coping with peripheral neuropathy can be stressful. Talk to a mental health specialist or join a support group if you are struggling.  Follow up with your health care provider as directed. SEEK MEDICAL CARE IF:   You have new signs or symptoms of peripheral neuropathy.  You are struggling emotionally from dealing with peripheral neuropathy.  You have a fever. SEEK IMMEDIATE MEDICAL CARE IF:   You have an injury or infection that is not healing.  You feel very dizzy or begin vomiting.  You have chest pain.  You have trouble breathing. Document Released: 11/23/2002 Document Revised: 08/15/2011 Document Reviewed: 08/10/2013 [facility] Patient Information 2015 [place], [place]. This information is not intended to replace advice given to you by your health care provider. Make sure you discuss any questions you have with your health care provider.

## 2014-09-30 NOTE — Assessment & Plan Note (Signed)
Adequate blood pressure control.  No evidence of new end organ damage.  Tolerating medication without significant adverse effects.  Plan to continue current blood pressure regiment of diltiazem XR 180 mg daily.

## 2014-09-30 NOTE — Assessment & Plan Note (Signed)
Hearing Aid may need adjustment or change. Patient having difficulty with hearing whisper.  Pt reports that once she has finished paying for hearing aid, she will return to audiologist to discuss change or adjustment to current Hearing aid.

## 2014-09-30 NOTE — Assessment & Plan Note (Signed)
Adequate symptom control. Tolerating opiate and NSAID medications. Continue current medication regiment.

## 2014-09-30 NOTE — Assessment & Plan Note (Signed)
Improved. Will continue Vicodin 10/325 TWICE DAILY for osteoarthritis pain and peripheral neuropathy pain.

## 2014-10-07 ENCOUNTER — Telehealth: Payer: Self-pay | Admitting: Family Medicine

## 2014-10-07 NOTE — Telephone Encounter (Signed)
Pt called because she said that we called but didn't leave a message. I didn't see any phone notes. jw

## 2014-10-07 NOTE — Telephone Encounter (Signed)
Will send message to MD to see if he called patient for something. Jazmin Hartsell,CMA

## 2014-10-18 ENCOUNTER — Encounter: Payer: Self-pay | Admitting: Family Medicine

## 2014-10-19 ENCOUNTER — Telehealth: Payer: Self-pay | Admitting: Family Medicine

## 2014-10-19 DIAGNOSIS — N390 Urinary tract infection, site not specified: Secondary | ICD-10-CM

## 2014-10-19 DIAGNOSIS — T83511A Infection and inflammatory reaction due to indwelling urethral catheter, initial encounter: Principal | ICD-10-CM

## 2014-10-19 NOTE — Telephone Encounter (Signed)
Pt called and has another UTI. She would like the doctor to call in something for this to her pharmacy. jw

## 2014-10-20 MED ORDER — CEPHALEXIN 500 MG PO CAPS: 500.0000 mg | ORAL_CAPSULE | Freq: Four times a day (QID) | ORAL | Status: DC

## 2014-10-20 NOTE — Telephone Encounter (Signed)
Urinary Tract Infection: Patient complains of abnormal smelling urine, burning with urination and pain in the lower abdomen She has had symptoms for 5 days. Patient denies back pain, fever, stomach ache and nausea.. Patient does have a history of recurrent UTI.  Patient does have a history of pyelonephritis. Patient has a neurogenic bladder for which she performs self catheterization twice a day.  A/ Possible UTI, complicated  P/ empiric trial of cephalexin 500 mg per oral QID for 7 days      Patient advised to contact office if symptoms not improving within 3 days or if condition worsens.

## 2014-10-26 ENCOUNTER — Telehealth: Payer: Self-pay | Admitting: Family Medicine

## 2014-10-26 ENCOUNTER — Other Ambulatory Visit: Payer: Self-pay | Admitting: Family Medicine

## 2014-10-26 NOTE — Telephone Encounter (Signed)
Patient request refill for hydrocodone 10-325 mg. Please follow up with Patient.

## 2014-10-26 NOTE — Telephone Encounter (Signed)
Patient request refill for Hydrocodone 10-325 mg.

## 2014-10-27 MED ORDER — HYDROCODONE-ACETAMINOPHEN 10-325 MG PO TABS: 1.0000 | ORAL_TABLET | Freq: Two times a day (BID) | ORAL | Status: DC | PRN

## 2014-10-27 NOTE — Telephone Encounter (Signed)
-----   Message from [place], MD sent at 10/27/2014  8:09 AM EST ----- Please let patient know her prescription for hydrocodone/Acetaminophen are available for pick up from the [facility] front desk.

## 2014-10-27 NOTE — Telephone Encounter (Signed)
LM for patient "rx is up front for pick up".  Xia Stohr,CMA

## 2014-11-12 ENCOUNTER — Other Ambulatory Visit: Payer: Self-pay | Admitting: Family Medicine

## 2014-11-12 DIAGNOSIS — Z1231 Encounter for screening mammogram for malignant neoplasm of breast: Secondary | ICD-10-CM

## 2014-11-16 ENCOUNTER — Other Ambulatory Visit: Payer: Self-pay | Admitting: Family Medicine

## 2014-11-16 ENCOUNTER — Encounter: Payer: Self-pay | Admitting: Family Medicine

## 2014-11-16 DIAGNOSIS — H40003 Preglaucoma, unspecified, bilateral: Secondary | ICD-10-CM

## 2014-11-16 DIAGNOSIS — H40009 Preglaucoma, unspecified, unspecified eye: Secondary | ICD-10-CM

## 2014-11-16 DIAGNOSIS — H01009 Unspecified blepharitis unspecified eye, unspecified eyelid: Secondary | ICD-10-CM | POA: Insufficient documentation

## 2014-11-16 DIAGNOSIS — H04129 Dry eye syndrome of unspecified lacrimal gland: Secondary | ICD-10-CM

## 2014-11-16 HISTORY — DX: Unspecified blepharitis unspecified eye, unspecified eyelid: H01.009

## 2014-11-16 HISTORY — DX: Dry eye syndrome of unspecified lacrimal gland: H04.129

## 2014-11-16 HISTORY — DX: Preglaucoma, unspecified, unspecified eye: H40.009

## 2014-11-22 ENCOUNTER — Telehealth: Payer: Self-pay | Admitting: Family Medicine

## 2014-11-22 DIAGNOSIS — M150 Primary generalized (osteo)arthritis: Secondary | ICD-10-CM

## 2014-11-22 DIAGNOSIS — G608 Other hereditary and idiopathic neuropathies: Secondary | ICD-10-CM

## 2014-11-22 DIAGNOSIS — M159 Polyosteoarthritis, unspecified: Secondary | ICD-10-CM

## 2014-11-22 DIAGNOSIS — G629 Polyneuropathy, unspecified: Secondary | ICD-10-CM

## 2014-11-22 DIAGNOSIS — M47812 Spondylosis without myelopathy or radiculopathy, cervical region: Secondary | ICD-10-CM

## 2014-11-22 DIAGNOSIS — M4804 Spinal stenosis, thoracic region: Secondary | ICD-10-CM

## 2014-11-22 DIAGNOSIS — M170 Bilateral primary osteoarthritis of knee: Secondary | ICD-10-CM

## 2014-11-22 DIAGNOSIS — G8929 Other chronic pain: Secondary | ICD-10-CM

## 2014-11-22 DIAGNOSIS — M160 Bilateral primary osteoarthritis of hip: Secondary | ICD-10-CM

## 2014-11-22 DIAGNOSIS — M48061 Spinal stenosis, lumbar region without neurogenic claudication: Secondary | ICD-10-CM

## 2014-11-22 DIAGNOSIS — M549 Dorsalgia, unspecified: Secondary | ICD-10-CM

## 2014-11-22 MED ORDER — HYDROCODONE-ACETAMINOPHEN 10-325 MG PO TABS: 1.0000 | ORAL_TABLET | Freq: Two times a day (BID) | ORAL | Status: DC | PRN

## 2014-11-22 NOTE — Telephone Encounter (Signed)
Please let patient know her prescription:  Hydrocodone -acetaminophen is available for pick up from the [facility] front desk.

## 2014-11-22 NOTE — Telephone Encounter (Signed)
Pt needs a refill on her hydrocodone left up front for pick up jw

## 2014-12-13 ENCOUNTER — Ambulatory Visit (HOSPITAL_COMMUNITY)
Admission: RE | Admit: 2014-12-13 | Discharge: 2014-12-13 | Disposition: A | Discharge status: Home / Self Care | Payer: Medicare Other | Source: Ambulatory Visit | Attending: Family Medicine | Admitting: Family Medicine

## 2014-12-13 ENCOUNTER — Ambulatory Visit (HOSPITAL_COMMUNITY): Payer: Medicare Other

## 2014-12-13 DIAGNOSIS — Z1231 Encounter for screening mammogram for malignant neoplasm of breast: Secondary | ICD-10-CM | POA: Diagnosis not present

## 2014-12-17 DIAGNOSIS — A048 Other specified bacterial intestinal infections: Secondary | ICD-10-CM

## 2014-12-17 DIAGNOSIS — K623 Rectal prolapse: Secondary | ICD-10-CM

## 2014-12-17 HISTORY — DX: Rectal prolapse: K62.3

## 2014-12-17 HISTORY — DX: Other specified bacterial intestinal infections: A04.8

## 2014-12-18 ENCOUNTER — Emergency Department (HOSPITAL_COMMUNITY)
Admission: EM | Admit: 2014-12-18 | Discharge: 2014-12-18 | Disposition: A | Discharge status: Home / Self Care | Payer: Medicare Other | Attending: Emergency Medicine | Admitting: Emergency Medicine

## 2014-12-18 ENCOUNTER — Emergency Department (HOSPITAL_COMMUNITY): Payer: Medicare Other

## 2014-12-18 ENCOUNTER — Encounter (HOSPITAL_COMMUNITY): Payer: Self-pay | Admitting: Emergency Medicine

## 2014-12-18 DIAGNOSIS — N390 Urinary tract infection, site not specified: Secondary | ICD-10-CM

## 2014-12-18 DIAGNOSIS — K8020 Calculus of gallbladder without cholecystitis without obstruction: Secondary | ICD-10-CM | POA: Diagnosis not present

## 2014-12-18 DIAGNOSIS — Z8719 Personal history of other diseases of the digestive system: Secondary | ICD-10-CM | POA: Insufficient documentation

## 2014-12-18 DIAGNOSIS — M159 Polyosteoarthritis, unspecified: Secondary | ICD-10-CM | POA: Insufficient documentation

## 2014-12-18 DIAGNOSIS — R195 Other fecal abnormalities: Secondary | ICD-10-CM | POA: Insufficient documentation

## 2014-12-18 DIAGNOSIS — R05 Cough: Secondary | ICD-10-CM | POA: Diagnosis not present

## 2014-12-18 DIAGNOSIS — I10 Essential (primary) hypertension: Secondary | ICD-10-CM | POA: Insufficient documentation

## 2014-12-18 DIAGNOSIS — Z87891 Personal history of nicotine dependence: Secondary | ICD-10-CM | POA: Diagnosis not present

## 2014-12-18 DIAGNOSIS — Z791 Long term (current) use of non-steroidal anti-inflammatories (NSAID): Secondary | ICD-10-CM | POA: Insufficient documentation

## 2014-12-18 DIAGNOSIS — Z86018 Personal history of other benign neoplasm: Secondary | ICD-10-CM | POA: Diagnosis not present

## 2014-12-18 DIAGNOSIS — Z9181 History of falling: Secondary | ICD-10-CM | POA: Insufficient documentation

## 2014-12-18 DIAGNOSIS — H40009 Preglaucoma, unspecified, unspecified eye: Secondary | ICD-10-CM | POA: Diagnosis not present

## 2014-12-18 DIAGNOSIS — Z8639 Personal history of other endocrine, nutritional and metabolic disease: Secondary | ICD-10-CM | POA: Insufficient documentation

## 2014-12-18 DIAGNOSIS — Z8742 Personal history of other diseases of the female genital tract: Secondary | ICD-10-CM | POA: Diagnosis not present

## 2014-12-18 DIAGNOSIS — Z86010 Personal history of colonic polyps: Secondary | ICD-10-CM | POA: Insufficient documentation

## 2014-12-18 DIAGNOSIS — Z90710 Acquired absence of both cervix and uterus: Secondary | ICD-10-CM | POA: Diagnosis not present

## 2014-12-18 DIAGNOSIS — Z8701 Personal history of pneumonia (recurrent): Secondary | ICD-10-CM | POA: Diagnosis not present

## 2014-12-18 DIAGNOSIS — G8929 Other chronic pain: Secondary | ICD-10-CM | POA: Diagnosis not present

## 2014-12-18 DIAGNOSIS — R1031 Right lower quadrant pain: Secondary | ICD-10-CM

## 2014-12-18 DIAGNOSIS — Z79899 Other long term (current) drug therapy: Secondary | ICD-10-CM | POA: Diagnosis not present

## 2014-12-18 DIAGNOSIS — B9689 Other specified bacterial agents as the cause of diseases classified elsewhere: Secondary | ICD-10-CM | POA: Diagnosis not present

## 2014-12-18 DIAGNOSIS — Z792 Long term (current) use of antibiotics: Secondary | ICD-10-CM | POA: Diagnosis not present

## 2014-12-18 LAB — TYPE AND SCREEN
ABO/RH(D): O POS
Antibody Screen: NEGATIVE

## 2014-12-18 LAB — COMPREHENSIVE METABOLIC PANEL
ALBUMIN: 3.9 g/dL (ref 3.5–5.2)
ALT: 12 U/L (ref 0–35)
AST: 20 U/L (ref 0–37)
Alkaline Phosphatase: 70 U/L (ref 39–117)
Anion gap: 9 (ref 5–15)
BUN: 18 mg/dL (ref 6–23)
CALCIUM: 9.4 mg/dL (ref 8.4–10.5)
CO2: 28 mmol/L (ref 19–32)
CREATININE: 0.87 mg/dL (ref 0.50–1.10)
Chloride: 103 mEq/L (ref 96–112)
GFR calc Af Amer: 72 mL/min — ABNORMAL LOW (ref >90)
GFR calc non Af Amer: 62 mL/min — ABNORMAL LOW (ref >90)
Glucose, Bld: 97 mg/dL (ref 70–99)
Potassium: 3.1 mmol/L — ABNORMAL LOW (ref 3.5–5.1)
Sodium: 140 mmol/L (ref 135–145)
TOTAL PROTEIN: 6.9 g/dL (ref 6.0–8.3)
Total Bilirubin: 0.4 mg/dL (ref 0.3–1.2)

## 2014-12-18 LAB — URINALYSIS, ROUTINE W REFLEX MICROSCOPIC
Bilirubin Urine: NEGATIVE
GLUCOSE, UA: NEGATIVE mg/dL
KETONES UR: NEGATIVE mg/dL
NITRITE: NEGATIVE
PH: 5.5 (ref 5.0–8.0)
Protein, ur: NEGATIVE mg/dL
SPECIFIC GRAVITY, URINE: 1.011 (ref 1.005–1.030)
Urobilinogen, UA: 0.2 mg/dL (ref 0.0–1.0)

## 2014-12-18 LAB — CBC WITH DIFFERENTIAL/PLATELET
BASOS ABS: 0 10*3/uL (ref 0.0–0.1)
BASOS PCT: 0 % (ref 0–1)
EOS ABS: 0.2 10*3/uL (ref 0.0–0.7)
EOS PCT: 3 % (ref 0–5)
HEMATOCRIT: 40.1 % (ref 36.0–46.0)
HEMOGLOBIN: 12.7 g/dL (ref 12.0–15.0)
Lymphocytes Relative: 16 % (ref 12–46)
Lymphs Abs: 1.2 10*3/uL (ref 0.7–4.0)
MCH: 27.5 pg (ref 26.0–34.0)
MCHC: 31.7 g/dL (ref 30.0–36.0)
MCV: 87 fL (ref 78.0–100.0)
MONO ABS: 0.6 10*3/uL (ref 0.1–1.0)
MONOS PCT: 8 % (ref 3–12)
Neutro Abs: 5.3 10*3/uL (ref 1.7–7.7)
Neutrophils Relative %: 73 % (ref 43–77)
Platelets: 130 10*3/uL — ABNORMAL LOW (ref 150–400)
RBC: 4.61 MIL/uL (ref 3.87–5.11)
RDW: 17.2 % — ABNORMAL HIGH (ref 11.5–15.5)
WBC: 7.3 10*3/uL (ref 4.0–10.5)

## 2014-12-18 LAB — URINE MICROSCOPIC-ADD ON

## 2014-12-18 LAB — PROTIME-INR
INR: 1.01 (ref 0.00–1.49)
PROTHROMBIN TIME: 13.4 s (ref 11.6–15.2)

## 2014-12-18 LAB — LACTIC ACID, PLASMA: Lactic Acid, Venous: 1.1 mmol/L (ref 0.5–2.2)

## 2014-12-18 LAB — OCCULT BLOOD X 1 CARD TO LAB, STOOL: Fecal Occult Bld: POSITIVE — AB

## 2014-12-18 LAB — ABO/RH: ABO/RH(D): O POS

## 2014-12-18 MED ORDER — FENTANYL CITRATE 0.05 MG/ML IJ SOLN
50.0000 ug | Freq: Once | INTRAMUSCULAR | Status: AC
  Administered 2014-12-18: 50 ug via INTRAVENOUS
  Filled 2014-12-18: qty 2

## 2014-12-18 MED ORDER — IOHEXOL 300 MG/ML  SOLN: 25.0000 mL | Freq: Once | INTRAMUSCULAR | Status: DC | PRN

## 2014-12-18 MED ORDER — OMEPRAZOLE 20 MG PO CPDR: 20.0000 mg | DELAYED_RELEASE_CAPSULE | Freq: Every day | ORAL | Status: DC

## 2014-12-18 MED ORDER — TRAMADOL HCL 50 MG PO TABS: 50.0000 mg | ORAL_TABLET | Freq: Four times a day (QID) | ORAL | Status: DC | PRN

## 2014-12-18 MED ORDER — IOHEXOL 300 MG/ML  SOLN
80.0000 mL | Freq: Once | INTRAMUSCULAR | Status: AC | PRN
  Administered 2014-12-18: 80 mL via INTRAVENOUS

## 2014-12-18 MED ORDER — CEPHALEXIN 500 MG PO CAPS: 500.0000 mg | ORAL_CAPSULE | Freq: Two times a day (BID) | ORAL | Status: AC

## 2014-12-18 MED ORDER — CEPHALEXIN 250 MG PO CAPS
500.0000 mg | ORAL_CAPSULE | Freq: Once | ORAL | Status: AC
  Administered 2014-12-18: 500 mg via ORAL
  Filled 2014-12-18: qty 2

## 2014-12-18 NOTE — ED Notes (Signed)
Patient transported to CT 

## 2014-12-18 NOTE — ED Provider Notes (Signed)
CSN: [id number]     Arrival date & time 12/18/14  1427 History   First MD Initiated Contact with Patient 12/18/14 1554     Chief Complaint  Patient presents with  . Blood In Stools  . Abdominal Pain  . Flank Pain     (Consider location/radiation/quality/duration/timing/severity/associated sxs/prior Treatment) Patient is a 81 y.o. female presenting with abdominal pain and flank pain. The history is provided by the patient.  Abdominal Pain Pain location:  R flank and RLQ Pain quality: aching and cramping   Pain radiates to:  Does not radiate Pain severity:  Mild Onset quality:  Gradual Duration:  2 days Timing:  Intermittent Progression:  Unchanged Chronicity:  New Context comment:  Dark stools Relieved by:  Nothing Worsened by:  Nothing tried Ineffective treatments:  None tried Associated symptoms: cough   Associated symptoms: no chest pain, no diarrhea, no dysuria, no fatigue, no fever, no hematuria, no nausea, no shortness of breath and no vomiting   Flank Pain Associated symptoms include abdominal pain. Pertinent negatives include no chest pain, no headaches and no shortness of breath.    Past Medical History  Diagnosis Date  . Essential hypertension, benign 04/18/2010  . Lichen sclerosus et atrophicus of the vulva   . BACK PAIN, CHRONIC 04/18/2010  . DEGENERATIVE JOINT DISEASE, AXIAL SKELETON 04/21/2010  . DEGENERATIVE JOINT DISEASE, HIPS 09/11/2007  . INSOMNIA, CHRONIC 04/18/2010  . Parotid adenoma 1990, 2012    Right parotid, recurrent parotid pleimorphic adenoma.   Marland Kitchen Spinal stenosis of thoracic region 07/26/2011  . Spinal stenosis of lumbar region 07/26/2011  . Foraminal stenosis of lumbar region 07/26/2011  . Foraminal stenosis of thoracic region 07/26/2011  . Osteoarthritis, multiple sites 08/11/2013  . COLONIC POLYPS, HX OF 12/17/2004    Annotation: polypectomy during colonoscopy in 2006 Qualifier: Diagnosis of  By: McDiarmid MD, Sherren Mocha    . Cystocele 08/11/2013  . Fall      recently x3  . Acute renal failure 02/23/2014  . Incontinence 02/09/2014  . Benign parotid tumor, right   . History of pneumonia 07/26/2011  . Rectal fissure 12/16/2013  . Urinary incontinence in female 02/04/2014  . UTI (urinary tract infection) 02/22/2014  . Right shoulder pain 09/30/2012    11/13 Xray Right shoulder: Right glenohumeral joint degenerative changes with osteophyte  formation along the inferior aspect of the glenoid.  Acromioclavicular joint degenerative changes with bony overgrowth.    Cathleen Fears ACETABULAR LABRAL TEAR BY MRI 09/11/2007    Qualifier: Diagnosis of  By: McDiarmid MD, Sherren Mocha    . Hearing loss sensory, bilateral 07/26/2011    Sensorineural Hearing Loss, bilateral, Right greater than Left.  Left ear hearing aid b/c work discrimination in       R. ear is very poor. Audiologist-Stephanie Nance at [facility] in [place].  (05/10/2010)   . Osteoarthritis of both knees 04/22/2013    Discussed use of low dose APAP and up to two tablets of hydrocodone/APA 7.5/325 a day as needed for painful exacerbation of knee pain.  Patient had 40 mg Solumedrol with 4 ml 1% lidocaine without epi injected into right knee with anterolateral approach after sterile prep.  No complications.      Marland Kitchen HYPERLIPIDEMIA 05/10/2010    Qualifier: Diagnosis of  By: McDiarmid MD, Sherren Mocha    . Incontinence overflow, urine 04/28/2014  . Incomplete emptying of bladder 02/09/2014  . Falls 08/06/2014  . At risk for falls 08/06/2014  . Paresthesia of both feet 08/06/2014  .  Orthostatic hypotension 08/06/2014  . Blepharitis 11/16/2014    Diagnosis by optometrist, Renaldo Tyrianna Lightle on exam 11/13/2014  . Dry eye syndrome 11/16/2014  . Glaucoma suspect 11/16/2014   Past Surgical History  Procedure Laterality Date  . Total abdominal hysterectomy w/ bilateral salpingoophorectomy  1964    Hysterectomy and bilateral oopherectomy at age 47 for benign reasons  . Bladder suspension      Bladder tack x 2 (Dr Janice Norrie)  . Cystocele  repair      Rectal prolapse and cyctocele adter hysterectomy requiring anterior repair Jerilynn Mages Edwinna Areola, MD)  . Rectocele repair      Rectal prolapse and cyctocele adter hysterectomy requiring anterior repair Jerilynn Mages Edwinna Areola, MD)  . Urethral dilation    . Parotid gland tumor excision  1990    S/P excision of Right Parotid Gland Benign Tumor, 1990  . Reconstruction of nose  1994    Nasal bridge reconstruction (Dr Judie Grieve, 1994) for following  Forklift accident on job. Surgery complicated by nerve damage resulting in difficulty raising right eyebrow   . Carpal tunnel release  2010    Carpel Tunnel Release of  left wrist  03/2009 (Dr Fredna Dow): Nerve   . Cataract extraction w/ intraocular lens implant  2010    Dr Charise Killian (ophth)  . Breast lumpectomy      Lumpectomy of benign Breast lumps bilaterally, Dr Bubba Camp  . Laminectomy      S/P L4-5 Laminectomy (1987) for decompression of spinal stenosis  . Colonoscopy w/ polypectomy  2006  . Parotidectomy  08/30/11    Radene Journey, MD (ENT) for recurrent right parotid pleomorphic adenoma by frozen section  . Carpal tunnel release      right wrist   Family History  Problem Relation Age of Onset  . Coronary artery disease Mother   . Coronary artery disease Sister     open heart surgery  . Diabetes type II Sister   . Stroke Father 79  . Hypertension Father   . Lupus Brother   . Heart disease Brother   . Heart attack Sister    History  Substance Use Topics  . Smoking status: Former Smoker    Quit date: 01/14/1984  . Smokeless tobacco: Never Used  . Alcohol Use: No   OB History    Gravida Para Term Preterm AB TAB SAB Ectopic Multiple Living   _0 Obstetric Comments   One daughter passed away one year ago All vaginal deliveries     Review of Systems  Constitutional: Negative for fever and fatigue.  HENT: Negative for congestion and drooling.   Eyes: Negative for pain.  Respiratory: Positive for cough. Negative for  shortness of breath.   Cardiovascular: Negative for chest pain.  Gastrointestinal: Positive for abdominal pain. Negative for nausea, vomiting and diarrhea.  Genitourinary: Positive for flank pain. Negative for dysuria and hematuria.       Dark stools  Musculoskeletal: Negative for back pain, gait problem and neck pain.  Skin: Negative for color change.  Neurological: Negative for dizziness and headaches.  Hematological: Negative for adenopathy.  Psychiatric/Behavioral: Negative for behavioral problems.  All other systems reviewed and are negative.     Allergies  Review of patient's allergies indicates no known allergies.  Home Medications   Prior to Admission medications   Medication Sig Start Date End Date Taking? Authorizing Provider  Catheters (BARD URETHRAL CATHETER TRAY) KIT Use to catheterize  daily 06/17/14   Historical Provider, MD  cephALEXin (KEFLEX) 500 MG capsule Take 1 capsule (500 mg total) by mouth 4 (four) times daily. 10/20/14   Blane Ohara McDiarmid, MD  DILT-XR 240 MG 24 hr capsule  09/19/14   Historical Provider, MD  diltiazem (CARDIZEM CD) 180 MG 24 hr capsule Take 180 mg by mouth daily. 11/18/14   Historical Provider, MD  diltiazem (DILACOR XR) 180 MG 24 hr capsule Take 1 capsule (180 mg total) by mouth daily. 08/31/14   Timmothy Euler, MD  hydrochlorothiazide (MICROZIDE) 12.5 MG capsule  09/19/14   Historical Provider, MD  HYDROcodone-acetaminophen (NORCO) 10-325 MG per tablet Take 1 tablet by mouth every 12 (twelve) hours as needed for moderate pain or severe pain. 11/22/14   Blane Ohara McDiarmid, MD  ibuprofen (ADVIL,MOTRIN) 200 MG tablet Take 400 mg by mouth every 6 (six) hours as needed for mild pain.    Historical Provider, MD  polyethylene glycol (MIRALAX / GLYCOLAX) packet Take 17 g by mouth daily as needed for moderate constipation.    Historical Provider, MD  Propylene Glycol 0.6 % SOLN Apply to eye. 11/16/14   Blane Ohara McDiarmid, MD   BP 140/83 mmHg  Pulse 72   Temp(Src) 98.2 F (36.8 C) (Oral)  Resp 16  Ht _0  (1.626 m)  Wt 142 lb (64.411 kg)  BMI 24.36 kg/m2  SpO2 98%  LMP 12/17/1962 Physical Exam  Constitutional: She is oriented to person, place, and time. She appears well-developed and well-nourished.  HENT:  Head: Normocephalic and atraumatic.  Mouth/Throat: Oropharynx is clear and moist. No oropharyngeal exudate.  Eyes: Conjunctivae and EOM are normal. Pupils are equal, round, and reactive to light.  Neck: Normal range of motion. Neck supple.  Cardiovascular: Normal rate, regular rhythm, normal heart sounds and intact distal pulses.  Exam reveals no gallop and no friction rub.   No murmur heard. Pulmonary/Chest: Effort normal and breath sounds normal. No respiratory distress. She has no wheezes.  Abdominal: Soft. Bowel sounds are normal. There is no tenderness. There is no rebound and no guarding.  Genitourinary:  Normal-appearing external rectum. No significant stool on the glove during rectal exam only rectal mucus which did not appear dark or bloody.  Musculoskeletal: Normal range of motion. She exhibits no edema or tenderness.  Neurological: She is alert and oriented to person, place, and time.  Skin: Skin is warm and dry.  Psychiatric: She has a normal mood and affect. Her behavior is normal.  Nursing note and vitals reviewed.   ED Course  Procedures (including critical care time) Labs Review Labs Reviewed  CBC WITH DIFFERENTIAL - Abnormal; Notable for the following:    RDW 17.2 (*)    Platelets 130 (*)    All other components within normal limits  COMPREHENSIVE METABOLIC PANEL - Abnormal; Notable for the following:    Potassium 3.1 (*)    GFR calc non Af Amer 62 (*)    GFR calc Af Amer 72 (*)    All other components within normal limits  URINALYSIS, ROUTINE W REFLEX MICROSCOPIC - Abnormal; Notable for the following:    APPearance CLOUDY (*)    Hgb urine dipstick MODERATE (*)    Leukocytes, UA MODERATE (*)    All  other components within normal limits  OCCULT BLOOD X 1 CARD TO LAB, STOOL - Abnormal; Notable for the following:    Fecal Occult Bld POSITIVE (*)    All other components within normal limits  URINE  MICROSCOPIC-ADD ON - Abnormal; Notable for the following:    Bacteria, UA FEW (*)    All other components within normal limits  URINE CULTURE  PROTIME-INR  LACTIC ACID, PLASMA  TYPE AND SCREEN  ABO/RH    Imaging Review Ct Abdomen Pelvis W Contrast  12/18/2014   CLINICAL DATA:  Acute right lower quadrant abdominal pain.  EXAM: CT ABDOMEN AND PELVIS WITH CONTRAST  TECHNIQUE: Multidetector CT imaging of the abdomen and pelvis was performed using the standard protocol following bolus administration of intravenous contrast.  CONTRAST:  48mL OMNIPAQUE IOHEXOL 300 MG/ML  SOLN  COMPARISON:  CT scans of December 27, 2013 and June 11, 2007.  FINDINGS: Severe multilevel degenerative disc disease is noted in the lumbar spine. Stable 3.4 mm nodule is noted laterally in the left lower lobe. No other abnormality seen in the visualized lung bases.  Multiple noncalcified gallstones are noted. Stable mild intrahepatic biliary dilatation is noted. The spleen and pancreas appear normal. Adrenal glands appear normal. Stable bilateral renal cysts are noted. No hydronephrosis or renal obstruction is noted. No renal or ureteral calculi are noted. Atherosclerotic calcifications of abdominal aorta and iliac arteries are noted without aneurysm formation. There is no evidence of bowel obstruction. No abnormal fluid collection is noted. Urinary bladder appears normal. Status post hysterectomy. No significant adenopathy is noted. Ovaries appear normal.  IMPRESSION: Cholelithiasis is noted without inflammation. Stable intrahepatic biliary dilatation is noted without significant common bile duct dilatation.  Severe multilevel degenerative disc disease is noted in the lumbar spine.  No other significant abnormality seen in the abdomen or  pelvis.   Electronically Signed   By: Sabino Dick M.D.   On: 12/18/2014 19:18     EKG Interpretation   Date/Time:  Saturday December 18 2014 16:38:42 EST Ventricular Rate:  73 PR Interval:  224 QRS Duration: 93 QT Interval:  401 QTC Calculation: 442 R Axis:   -3 Text Interpretation:  Sinus rhythm Prolonged PR interval no other  significant change Confirmed by Remy Dia  MD, Kaelynne Christley ([id number]) on 12/18/2014  4:51:33 PM      MDM   Final diagnoses:  RLQ abdominal pain  Dark stools  UTI (lower urinary tract infection)    4:28 PM 81 y.o. female w hx of HTN, colonic polyps, urinary incont, rectal fissure who presents with abdominal pain and dark stools for the last 2 days. She states that on Thursday night of this week she began having multiple dark stools. She reports up to 5 loose dark stools today. She denies any previous history of this. She also complains of intermittent right lower quadrant and right flank cramping pain which began around the same time as the stools. She denies any fevers, chest pain, shortness of breath. She is afebrile and vital signs are unremarkable here. We'll get screening labs and imaging.   7:57 PM: I interpreted/reviewed the labs and/or imaging which were non-contributory.  I discussed the different options available for disposition including admission versus outpatient follow-up. I offered admission but the family would prefer to follow-up with her PCP on Monday. She notes that the dark stools have decreased in frequency since they started. They do not seem to be worsening overall. Her hemoglobin is stable here. Her lactic acid was normal. Her vital signs are unremarkable. Her CT scan was noncontributory. I think it is reasonable to seek outpatient follow-up and I strongly urged her to return for any worsening. Will cover for UTI.  I have discussed the diagnosis/risks/treatment options  with the patient and family and believe the pt to be eligible for discharge home  to follow-up with her pcp on Monday who can refer her to GI (pt w/ hx of polypectomy but unsure who she saw). We also discussed returning to the ED immediately if new or worsening sx occur. We discussed the sx which are most concerning (e.g., worsening bloody stools, fatigue, shortness of breath, chest pain, syncope, lightheadedness, worsening abdominal pain) that necessitate immediate return. Medications administered to the patient during their visit and any new prescriptions provided to the patient are listed below.  Medications given during this visit Medications  iohexol (OMNIPAQUE) 300 MG/ML solution 25 mL (not administered)  cephALEXin (KEFLEX) capsule 500 mg (not administered)  fentaNYL (SUBLIMAZE) injection 50 mcg (50 mcg Intravenous Given 12/18/14 1833)  iohexol (OMNIPAQUE) 300 MG/ML solution 80 mL (80 mLs Intravenous Contrast Given 12/18/14 1845)    New Prescriptions   CEPHALEXIN (KEFLEX) 500 MG CAPSULE    Take 1 capsule (500 mg total) by mouth 2 (two) times daily.   OMEPRAZOLE (PRILOSEC) 20 MG CAPSULE    Take 1 capsule (20 mg total) by mouth daily.      Pamella Pert, MD 12/18/14 2000

## 2014-12-18 NOTE — ED Notes (Addendum)
Pt c/o black tarry stools onset Thursday with abdominal pain and right flank pain. Pt reports that she has to catheterize herself. Pt reports nausea.

## 2014-12-20 ENCOUNTER — Encounter: Payer: Self-pay | Admitting: Family Medicine

## 2014-12-20 ENCOUNTER — Telehealth: Payer: Self-pay | Admitting: Family Medicine

## 2014-12-20 DIAGNOSIS — M150 Primary generalized (osteo)arthritis: Secondary | ICD-10-CM

## 2014-12-20 DIAGNOSIS — M160 Bilateral primary osteoarthritis of hip: Secondary | ICD-10-CM

## 2014-12-20 DIAGNOSIS — G8929 Other chronic pain: Secondary | ICD-10-CM

## 2014-12-20 DIAGNOSIS — M48061 Spinal stenosis, lumbar region without neurogenic claudication: Secondary | ICD-10-CM

## 2014-12-20 DIAGNOSIS — M4804 Spinal stenosis, thoracic region: Secondary | ICD-10-CM

## 2014-12-20 DIAGNOSIS — K921 Melena: Secondary | ICD-10-CM

## 2014-12-20 DIAGNOSIS — G629 Polyneuropathy, unspecified: Secondary | ICD-10-CM

## 2014-12-20 DIAGNOSIS — G608 Other hereditary and idiopathic neuropathies: Secondary | ICD-10-CM

## 2014-12-20 DIAGNOSIS — M47812 Spondylosis without myelopathy or radiculopathy, cervical region: Secondary | ICD-10-CM

## 2014-12-20 DIAGNOSIS — M170 Bilateral primary osteoarthritis of knee: Secondary | ICD-10-CM

## 2014-12-20 DIAGNOSIS — M159 Polyosteoarthritis, unspecified: Secondary | ICD-10-CM

## 2014-12-20 DIAGNOSIS — M549 Dorsalgia, unspecified: Secondary | ICD-10-CM

## 2014-12-20 HISTORY — DX: Melena: K92.1

## 2014-12-20 MED ORDER — HYDROCODONE-ACETAMINOPHEN 10-325 MG PO TABS: 1.0000 | ORAL_TABLET | Freq: Two times a day (BID) | ORAL | Status: DC | PRN

## 2014-12-20 NOTE — Telephone Encounter (Signed)
I spoke with Nicole Perez, patient's dgt about her mother's ED visit on Saturday where EDP found blood in stool. Pt has history of colonoscopy with colonic polyp removal in past.   Nicole Perez requesting referral to GI specialist for consideration of repeat colonoscopy in work up of blood in stool.   Neither Nicole Perez nor her mother could recall which GI doctor they Nicole Perez saw in 2006 for her colonoscopy.   I could not find a record of who performed the 2006 colonoscopy in the patient's EMR.   Will set up a referral to [facility] for evaluation of the patient's blood in stool and history of colonic polyp (2006).   Patient's dgt, Nicole Perez, notified  That her mother's Norco prescription refill is available for pick up from the [facility] front desk.

## 2014-12-20 NOTE — Telephone Encounter (Signed)
Went to ER on Saturday for blood in her stool. It was recommended she have a colonoscopy. Please let daughter kinow next step. Also needs pain meds refilled

## 2014-12-21 ENCOUNTER — Telehealth (HOSPITAL_BASED_OUTPATIENT_CLINIC_OR_DEPARTMENT_OTHER): Payer: Self-pay | Admitting: Emergency Medicine

## 2014-12-21 LAB — URINE CULTURE

## 2014-12-21 NOTE — Progress Notes (Signed)
ED Antimicrobial Stewardship Positive Culture Follow Up   Nicole Perez is an 81 y.o. female who presented to [facility] on 12/18/2014 with a chief complaint of  Chief Complaint  Patient presents with  . Blood In Stools  . Abdominal Pain  . Flank Pain    Recent Results (from the past 720 hour(s))  Urine culture     Status: None   Collection Time: 12/18/14  5:40 PM  Result Value Ref Range Status   Specimen Description URINE, RANDOM  Final   Special Requests ADDED 2048  Final   Colony Count   Final    55,000 COLONIES/ML Performed at [facility]    Culture   Final    PSEUDOMONAS AERUGINOSA Performed at [facility]    Report Status 12/21/2014 FINAL  Final   Organism ID, Bacteria PSEUDOMONAS AERUGINOSA  Final      Susceptibility   Pseudomonas aeruginosa - MIC*    CEFEPIME 8 SENSITIVE Sensitive     CEFTAZIDIME 4 SENSITIVE Sensitive     CIPROFLOXACIN <=0.25 SENSITIVE Sensitive     GENTAMICIN 8 INTERMEDIATE Intermediate     IMIPENEM 1 SENSITIVE Sensitive     PIP/TAZO 8 SENSITIVE Sensitive     TOBRAMYCIN <=1 SENSITIVE Sensitive     * PSEUDOMONAS AERUGINOSA    [x]  Treated with cephalexin, organism resistant to prescribed antimicrobial []  Patient discharged originally without antimicrobial agent and treatment is now indicated  New antibiotic prescription: ciprofloxacin 250mg  po BID x 7 days  ED Provider: Alvina Chou, PA-C   [name] 12/21/2014, 9:38 AM Infectious Diseases Pharmacist Phone# [phone]

## 2014-12-21 NOTE — Telephone Encounter (Incomplete)
Post ED Visit - Positive Culture Follow-up: Successful Patient Follow-Up  Culture assessed and recommendations reviewed by: []  [place], Pharm.D., BCPS [x]  Heide Guile, Pharm.D., BCPS []  Alycia Rossetti, Pharm.D., BCPS []  [place], Pharm.D., BCPS, AAHIVP []  Legrand Como, Pharm.D., BCPS, AAHIVP []  Hassie Bruce, Pharm.D. []  Milus Glazier, Pharm.D.  Positive urine  Culture pseudomonas   []  Patient discharged without antimicrobial prescription and treatment is now indicated [x]  Organism is resistant to prescribed ED discharge antimicrobial []  Patient with positive blood cultures  Changes discussed with ED provider: Alvina Chou PA              New antibiotic prescription Cipro 250mg  po bid x 7 days Called to [place] patient, 12/21/14 1155   Hazle Nordmann 12/21/2014, 11:50 AM

## 2014-12-27 ENCOUNTER — Telehealth: Payer: Self-pay | Admitting: Family Medicine

## 2014-12-27 ENCOUNTER — Encounter: Payer: Self-pay | Admitting: Internal Medicine

## 2014-12-27 NOTE — Telephone Encounter (Signed)
LM for patient to call back.  [facility] has been trying to reach patient and left voice mails for her to call back.  Please have her call [phone] to schedule her appt.  Thanks [employer]

## 2014-12-27 NOTE — Telephone Encounter (Signed)
Daughter called and wanted to know when she is going to be scheduled for her colonoscopy. jw

## 2014-12-31 DIAGNOSIS — R339 Retention of urine, unspecified: Secondary | ICD-10-CM | POA: Diagnosis not present

## 2015-01-03 DIAGNOSIS — R339 Retention of urine, unspecified: Secondary | ICD-10-CM | POA: Diagnosis not present

## 2015-01-17 ENCOUNTER — Other Ambulatory Visit: Payer: Self-pay | Admitting: Family Medicine

## 2015-01-17 DIAGNOSIS — M170 Bilateral primary osteoarthritis of knee: Secondary | ICD-10-CM

## 2015-01-17 DIAGNOSIS — M48061 Spinal stenosis, lumbar region without neurogenic claudication: Secondary | ICD-10-CM

## 2015-01-17 DIAGNOSIS — M159 Polyosteoarthritis, unspecified: Secondary | ICD-10-CM

## 2015-01-17 DIAGNOSIS — M4804 Spinal stenosis, thoracic region: Secondary | ICD-10-CM

## 2015-01-17 DIAGNOSIS — M549 Dorsalgia, unspecified: Secondary | ICD-10-CM

## 2015-01-17 DIAGNOSIS — M160 Bilateral primary osteoarthritis of hip: Secondary | ICD-10-CM

## 2015-01-17 DIAGNOSIS — G629 Polyneuropathy, unspecified: Secondary | ICD-10-CM

## 2015-01-17 DIAGNOSIS — G8929 Other chronic pain: Secondary | ICD-10-CM

## 2015-01-17 DIAGNOSIS — G608 Other hereditary and idiopathic neuropathies: Secondary | ICD-10-CM

## 2015-01-17 DIAGNOSIS — M150 Primary generalized (osteo)arthritis: Secondary | ICD-10-CM

## 2015-01-17 DIAGNOSIS — M47812 Spondylosis without myelopathy or radiculopathy, cervical region: Secondary | ICD-10-CM

## 2015-01-17 NOTE — Telephone Encounter (Signed)
Needs hydrocodone refill Please call daughter when ready to pick up

## 2015-01-18 MED ORDER — HYDROCODONE-ACETAMINOPHEN 10-325 MG PO TABS: 1.0000 | ORAL_TABLET | Freq: Two times a day (BID) | ORAL | Status: DC | PRN

## 2015-01-18 NOTE — Telephone Encounter (Signed)
LM for patient that prescriptions are ready for pick up. Brent Noto,CMA

## 2015-02-03 ENCOUNTER — Encounter: Payer: Self-pay | Admitting: *Deleted

## 2015-02-07 ENCOUNTER — Telehealth: Payer: Self-pay | Admitting: Family Medicine

## 2015-02-07 DIAGNOSIS — N319 Neuromuscular dysfunction of bladder, unspecified: Secondary | ICD-10-CM

## 2015-02-07 MED ORDER — CIPROFLOXACIN HCL 500 MG PO TABS: 500.0000 mg | ORAL_TABLET | Freq: Two times a day (BID) | ORAL | Status: DC

## 2015-02-07 NOTE — Telephone Encounter (Signed)
[name] is calling regarding Nicole Perez having burning when urinating and very dark urine.  Also c/o of pain to rt flank area.  Need something sent to pharmacy for this.  Please call [name] for further info.

## 2015-02-07 NOTE — Telephone Encounter (Signed)
I spoke with Nicole Perez Onset 1 day ago of Dysuria, "thick orange" urine, right side lower back pain. No fever.  No N/V.  No abdominal pain.   History of recurrent UTI  A/ Possible UTI in patient with history of recurrent UTI P/ Cipro 500 mg every 12 hours for 7 days.      Patient to let our office know if she is not improved within 48 hours or if her condition worsens.

## 2015-02-07 NOTE — Telephone Encounter (Signed)
Will forward to MD. Jazmin Hartsell,CMA  

## 2015-02-15 ENCOUNTER — Encounter: Payer: Self-pay | Admitting: Internal Medicine

## 2015-02-15 ENCOUNTER — Ambulatory Visit (INDEPENDENT_AMBULATORY_CARE_PROVIDER_SITE_OTHER): Payer: Medicare Other | Admitting: Internal Medicine

## 2015-02-15 ENCOUNTER — Other Ambulatory Visit: Payer: Self-pay | Admitting: Family Medicine

## 2015-02-15 VITALS — BP 112/60 | HR 72 | Ht <= 58 in | Wt 142.8 lb

## 2015-02-15 DIAGNOSIS — R195 Other fecal abnormalities: Secondary | ICD-10-CM | POA: Diagnosis not present

## 2015-02-15 DIAGNOSIS — K5909 Other constipation: Secondary | ICD-10-CM | POA: Diagnosis not present

## 2015-02-15 DIAGNOSIS — R198 Other specified symptoms and signs involving the digestive system and abdomen: Secondary | ICD-10-CM

## 2015-02-15 DIAGNOSIS — K623 Rectal prolapse: Secondary | ICD-10-CM

## 2015-02-15 DIAGNOSIS — K921 Melena: Secondary | ICD-10-CM | POA: Diagnosis not present

## 2015-02-15 MED ORDER — MOVIPREP 100 G PO SOLR: 1.0000 | Freq: Once | ORAL | Status: DC

## 2015-02-15 NOTE — Patient Instructions (Signed)

## 2015-02-15 NOTE — Telephone Encounter (Signed)
Needs tramadol refilled

## 2015-02-15 NOTE — Progress Notes (Signed)
Patient ID: Nicole Perez, female   DOB: Apr 30, 1933, 81 y.o.   MRN: [id number] HPI: [name] is a 81 yo female with PMH of hypertension, osteoarthritis and degenerative disc disease, spinal stenosis, history of colon polyps, urinary retention requiring chronic in and out catheterization who is seen in consultation at the request of Dr.McDiarmid evaluate melena and heme-positive stool. She is here today with her daughter. She reports that in January she had several days of black, tarry and sticky stools. These were found to be heme negative. This has resolved and she reports stools are now brown though she has trouble with constipation. She is having a proximally 2 bowel movements per week all stimulated by milk of magnesia. She does report a rectal pressure and feeling like "something has dropped". This sensation is most noticeable in between her vagina and rectum. She does report having to strain with stool. Regarding urinary symptoms she has history of bladder tack surgery and now is performing in and out caths 2 times per day. She does have a history of frequent urinary tract infection. She currently denies upper GI complaint other than occasional nausea. This seems to be worse if she stands quickly. She reports a stable weight without significant weight loss. She denies dysphagia, odynophagia or severe heartburn. She has a good appetite and denies early satiety. No hepatobiliary complaint.  She had a CT scan in January 2016 of abdomen and pelvis which showed cholelithiasis without inflammation, stable intrahepatic bili ductal dilatation with out significant common bile duct dilatation, severe multilevel degenerative disc disease in the lumbar spine, and no other abnormality. Status post hysterectomy.  Prior CT scan did show changes of rectal prolapse on sagittal images.  See below regarding prior colonoscopy  Past Medical History  Diagnosis Date  . Essential hypertension, benign 04/18/2010  . Lichen  sclerosus et atrophicus of the vulva   . BACK PAIN, CHRONIC 04/18/2010  . DEGENERATIVE JOINT DISEASE, AXIAL SKELETON 04/21/2010  . DEGENERATIVE JOINT DISEASE, HIPS 09/11/2007  . INSOMNIA, CHRONIC 04/18/2010  . Parotid adenoma 1990, 2012    Right parotid, recurrent parotid pleimorphic adenoma.   Marland Kitchen Spinal stenosis of thoracic region 07/26/2011  . Spinal stenosis of lumbar region 07/26/2011  . Foraminal stenosis of lumbar region 07/26/2011  . Foraminal stenosis of thoracic region 07/26/2011  . Osteoarthritis, multiple sites 08/11/2013  . COLONIC POLYPS, HX OF 12/17/2004    Annotation: polypectomy during colonoscopy in 2006 Qualifier: Diagnosis of  By: McDiarmid MD, Sherren Mocha    . Cystocele 08/11/2013  . Fall     recently x3  . Acute renal failure 02/23/2014  . Incontinence 02/09/2014  . Benign parotid tumor, right   . History of pneumonia 07/26/2011  . Rectal fissure 12/16/2013  . Urinary incontinence in female 02/04/2014  . UTI (urinary tract infection) 02/22/2014  . Right shoulder pain 09/30/2012    11/13 Xray Right shoulder: Right glenohumeral joint degenerative changes with osteophyte  formation along the inferior aspect of the glenoid.  Acromioclavicular joint degenerative changes with bony overgrowth.    Cathleen Fears ACETABULAR LABRAL TEAR BY MRI 09/11/2007    Qualifier: Diagnosis of  By: McDiarmid MD, Sherren Mocha    . Hearing loss sensory, bilateral 07/26/2011    Sensorineural Hearing Loss, bilateral, Right greater than Left.  Left ear hearing aid b/c work discrimination in       R. ear is very poor. Audiologist-Stephanie Nance at [facility] in [place].  (05/10/2010)   . Osteoarthritis of both knees 04/22/2013  Discussed use of low dose APAP and up to two tablets of hydrocodone/APA 7.5/325 a day as needed for painful exacerbation of knee pain.  Patient had 40 mg Solumedrol with 4 ml 1% lidocaine without epi injected into right knee with anterolateral approach after sterile prep.  No complications.      Marland Kitchen  HYPERLIPIDEMIA 05/10/2010    Qualifier: Diagnosis of  By: McDiarmid MD, Sherren Mocha    . Incontinence overflow, urine 04/28/2014  . Incomplete emptying of bladder 02/09/2014  . Falls 08/06/2014  . At risk for falls 08/06/2014  . Paresthesia of both feet 08/06/2014  . Orthostatic hypotension 08/06/2014  . Blepharitis 11/16/2014    Diagnosis by optometrist, Renaldo Harrison on exam 11/13/2014  . Dry eye syndrome 11/16/2014  . Glaucoma suspect 11/16/2014  . Blood in stool 12/20/2014  . Cholelithiasis   . Colon polyp     Past Surgical History  Procedure Laterality Date  . Total abdominal hysterectomy w/ bilateral salpingoophorectomy  1964    Hysterectomy and bilateral oopherectomy at age 74 for benign reasons  . Bladder suspension      Bladder tack x 2 (Dr Janice Norrie)  . Cystocele repair      Rectal prolapse and cyctocele adter hysterectomy requiring anterior repair Jerilynn Mages Edwinna Areola, MD)  . Rectocele repair      Rectal prolapse and cyctocele adter hysterectomy requiring anterior repair Jerilynn Mages Edwinna Areola, MD)  . Urethral dilation    . Parotid gland tumor excision  1990    S/P excision of Right Parotid Gland Benign Tumor, 1990  . Reconstruction of nose  1994    Nasal bridge reconstruction (Dr Judie Grieve, 1994) for following  Forklift accident on job. Surgery complicated by nerve damage resulting in difficulty raising right eyebrow   . Carpal tunnel release  2010    Carpel Tunnel Release of  left wrist  03/2009 (Dr Fredna Dow): Nerve   . Cataract extraction w/ intraocular lens implant  2010    Dr Charise Killian (ophth)  . Breast lumpectomy      Lumpectomy of benign Breast lumps bilaterally, Dr Bubba Camp  . Laminectomy      S/P L4-5 Laminectomy (1987) for decompression of spinal stenosis  . Colonoscopy w/ polypectomy  2006  . Parotidectomy  08/30/11    Radene Journey, MD (ENT) for recurrent right parotid pleomorphic adenoma by frozen section  . Carpal tunnel release      right wrist    Outpatient Prescriptions Prior to Visit   Medication Sig Dispense Refill  . Catheters (BARD URETHRAL CATHETER TRAY) KIT Use to catheterize daily    . ciprofloxacin (CIPRO) 500 MG tablet Take 1 tablet (500 mg total) by mouth 2 (two) times daily. 20 tablet 0  . diltiazem (DILACOR XR) 180 MG 24 hr capsule Take 1 capsule (180 mg total) by mouth daily. 30 capsule 2  . hydrochlorothiazide (MICROZIDE) 12.5 MG capsule Take 12.5 mg by mouth daily.   0  . HYDROcodone-acetaminophen (NORCO) 10-325 MG per tablet Take 1 tablet by mouth every 12 (twelve) hours as needed for moderate pain or severe pain. 60 tablet 0  . ibuprofen (ADVIL,MOTRIN) 200 MG tablet Take 400 mg by mouth every 6 (six) hours as needed for mild pain.    Marland Kitchen omeprazole (PRILOSEC) 20 MG capsule Take 1 capsule (20 mg total) by mouth daily. 30 capsule 0  . polyethylene glycol (MIRALAX / GLYCOLAX) packet Take 17 g by mouth daily as needed for moderate constipation.    Marland Kitchen Propylene Glycol 0.6 %  SOLN Apply 1 drop to eye as needed (dry eyes).     . traMADol (ULTRAM) 50 MG tablet Take 1 tablet (50 mg total) by mouth every 6 (six) hours as needed. 15 tablet 0   No facility-administered medications prior to visit.    No Known Allergies  Family History  Problem Relation Age of Onset  . Coronary artery disease Mother   . Coronary artery disease Sister     open heart surgery  . Diabetes type II Sister   . Stroke Father 17  . Hypertension Father   . Lupus Brother   . Heart disease Brother   . Heart attack Sister     History  Substance Use Topics  . Smoking status: Former Smoker    Quit date: 01/14/1984  . Smokeless tobacco: Never Used  . Alcohol Use: No    ROS: As per history of present illness, otherwise negative  BP 112/60 mmHg  Pulse 72  Ht _0  (1.448 m)  Wt 142 lb 12.8 oz (64.774 kg)  BMI 30.89 kg/m2  LMP 12/17/1962 Constitutional: Well-developed and well-nourished. No distress. HEENT: Normocephalic and atraumatic. Oropharynx is clear and moist. No oropharyngeal  exudate. Conjunctivae are normal.  No scleral icterus. Neck: Neck supple. Trachea midline. Cardiovascular: Normal rate, regular rhythm and intact distal pulses. No M/R/G Pulmonary/chest: Effort normal and breath sounds normal. No wheezing, rales or rhonchi. Abdominal: Soft, nontender, nondistended. Bowel sounds active throughout. There are no masses palpable. No hepatosplenomegaly. Extremities: no clubbing, cyanosis, or edema Lymphadenopathy: No cervical adenopathy noted. Neurological: Alert and oriented to person place and time. Skin: Skin is warm and dry. No rashes noted. Psychiatric: Normal mood and affect. Behavior is normal.  RELEVANT LABS AND IMAGING: CBC    Component Value Date/Time   WBC 7.3 12/18/2014 1452   RBC 4.61 12/18/2014 1452   HGB 12.7 12/18/2014 1452   HCT 40.1 12/18/2014 1452   PLT 130* 12/18/2014 1452   MCV 87.0 12/18/2014 1452   MCH 27.5 12/18/2014 1452   MCHC 31.7 12/18/2014 1452   RDW 17.2* 12/18/2014 1452   LYMPHSABS 1.2 12/18/2014 1452   MONOABS 0.6 12/18/2014 1452   EOSABS 0.2 12/18/2014 1452   BASOSABS 0.0 12/18/2014 1452    CMP     Component Value Date/Time   NA 140 12/18/2014 1452   NA 143 11/05/2013 1437   K 3.1* 12/18/2014 1452   CL 103 12/18/2014 1452   CO2 28 12/18/2014 1452   GLUCOSE 97 12/18/2014 1452   GLUCOSE 99 11/05/2013 1437   BUN 18 12/18/2014 1452   BUN 14 11/05/2013 1437   CREATININE 0.87 12/18/2014 1452   CREATININE 0.82 08/05/2014 1027   CALCIUM 9.4 12/18/2014 1452   PROT 6.9 12/18/2014 1452   PROT 6.9 11/24/2013 1257   ALBUMIN 3.9 12/18/2014 1452   AST 20 12/18/2014 1452   ALT 12 12/18/2014 1452   ALKPHOS 70 12/18/2014 1452   BILITOT 0.4 12/18/2014 1452   GFRNONAA 62* 12/18/2014 1452   GFRAA 72* 12/18/2014 1452   CLINICAL DATA:  Acute right lower quadrant abdominal pain.   EXAM: CT ABDOMEN AND PELVIS WITH CONTRAST   TECHNIQUE: Multidetector CT imaging of the abdomen and pelvis was performed using the standard  protocol following bolus administration of intravenous contrast.   CONTRAST:  64m OMNIPAQUE IOHEXOL 300 MG/ML  SOLN   COMPARISON:  CT scans of December 27, 2013 and June 11, 2007.   FINDINGS: Severe multilevel degenerative disc disease is noted in the lumbar  spine. Stable 3.4 mm nodule is noted laterally in the left lower lobe. No other abnormality seen in the visualized lung bases.   Multiple noncalcified gallstones are noted. Stable mild intrahepatic biliary dilatation is noted. The spleen and pancreas appear normal. Adrenal glands appear normal. Stable bilateral renal cysts are noted. No hydronephrosis or renal obstruction is noted. No renal or ureteral calculi are noted. Atherosclerotic calcifications of abdominal aorta and iliac arteries are noted without aneurysm formation. There is no evidence of bowel obstruction. No abnormal fluid collection is noted. Urinary bladder appears normal. Status post hysterectomy. No significant adenopathy is noted. Ovaries appear normal.   IMPRESSION: Cholelithiasis is noted without inflammation. Stable intrahepatic biliary dilatation is noted without significant common bile duct dilatation.   Severe multilevel degenerative disc disease is noted in the lumbar spine.   No other significant abnormality seen in the abdomen or pelvis.     Electronically Signed   By: Sabino Dick M.D.   On: 12/18/2014 19:18   Colonoscopy dated 03/07/2005 -- performed by Dr. Wynetta Emery. He notes in the indication prior colonoscopy September 2001 with many small polyps in the distal sigmoid and rectum sampled is hyperplastic. Findings at this colonoscopy = small neoplastic appearing polyp removed from the descending colon at 60 cm, many small hyperplastic appearing polyps in the rectum and distal sigmoid which were biopsied. Pathology = sigmoid colon polyp was hyperplastic without adenomatous change or malignancy. Rectosigmoid biopsies hyperplastic  polyp.   ASSESSMENT/PLAN: 81 yo female with PMH of hypertension, osteoarthritis and degenerative disc disease, spinal stenosis, history of colon polyps, urinary retention requiring chronic in and out catheterization who is seen in consultation at the request of Dr.McDiarmid evaluate melena and heme-positive stool.   1. Heme positive stools/melena -- she gives a very accurate description of melena in her stool was found to be heme positive. Fortunately she is not currently anemic nor microcytic. Etiology for heme-positive stool unknown and for this reason I have recommended endoscopy and colonoscopy. We discussed the risks and benefits of both test since she is agreeable to proceed. I asked that she notify me should melena return prior to this evaluation. She voices understanding.  2. Rectal pressure/constipation -- likely secondary to rectal prolapse. We'll evaluate this further at the time of colonoscopy. Constipation certainly will exacerbate rectal prolapse. I recommended that she continue milk of magnesia on a more regular basis to help promote more regular bowel movements and avoid constipation. She previously tried MiraLAX without benefit. She is hesitant to begin a prescription laxative at this time. Further evaluation of the rectum and rectal prolapse at the time of colonoscopy. Any correction of this issue would be likely surgical      Cc:Acquanetta Sit, [address] [place], [place] [id number]

## 2015-02-16 ENCOUNTER — Other Ambulatory Visit: Payer: Self-pay | Admitting: Family Medicine

## 2015-02-16 ENCOUNTER — Encounter: Payer: Medicare Other | Admitting: Internal Medicine

## 2015-02-16 DIAGNOSIS — G608 Other hereditary and idiopathic neuropathies: Secondary | ICD-10-CM

## 2015-02-16 DIAGNOSIS — M549 Dorsalgia, unspecified: Secondary | ICD-10-CM

## 2015-02-16 DIAGNOSIS — M48061 Spinal stenosis, lumbar region without neurogenic claudication: Secondary | ICD-10-CM

## 2015-02-16 DIAGNOSIS — M159 Polyosteoarthritis, unspecified: Secondary | ICD-10-CM

## 2015-02-16 DIAGNOSIS — M47812 Spondylosis without myelopathy or radiculopathy, cervical region: Secondary | ICD-10-CM

## 2015-02-16 DIAGNOSIS — G629 Polyneuropathy, unspecified: Secondary | ICD-10-CM

## 2015-02-16 DIAGNOSIS — M150 Primary generalized (osteo)arthritis: Secondary | ICD-10-CM

## 2015-02-16 DIAGNOSIS — M4804 Spinal stenosis, thoracic region: Secondary | ICD-10-CM

## 2015-02-16 DIAGNOSIS — G8929 Other chronic pain: Secondary | ICD-10-CM

## 2015-02-16 DIAGNOSIS — M170 Bilateral primary osteoarthritis of knee: Secondary | ICD-10-CM

## 2015-02-16 DIAGNOSIS — M160 Bilateral primary osteoarthritis of hip: Secondary | ICD-10-CM

## 2015-02-16 NOTE — Telephone Encounter (Signed)
Pt called and needs a refill on her Hydrocodone left up front for pick up. Please call patient when ready. jw

## 2015-02-21 ENCOUNTER — Telehealth: Payer: Self-pay | Admitting: Family Medicine

## 2015-02-21 MED ORDER — HYDROCODONE-ACETAMINOPHEN 10-325 MG PO TABS: 1.0000 | ORAL_TABLET | Freq: Two times a day (BID) | ORAL | Status: DC | PRN

## 2015-02-21 NOTE — Telephone Encounter (Signed)
Will forward to MD.  A previous refill request has already been made for this medication on 02/16/2015. Jacqueline Spofford,CMA

## 2015-02-21 NOTE — Telephone Encounter (Signed)
Spoke with daughter Asa Lente. Devora Tortorella,CMA

## 2015-02-21 NOTE — Telephone Encounter (Signed)
Daughter called to check the status of her mothers request for Hydrocodone.Please call when ready to pick up. jw

## 2015-02-21 NOTE — Telephone Encounter (Signed)
Please let patient know her prescription(s) are available for pick up from the [facility] front desk.  

## 2015-02-23 DIAGNOSIS — N8111 Cystocele, midline: Secondary | ICD-10-CM | POA: Diagnosis not present

## 2015-02-23 DIAGNOSIS — R339 Retention of urine, unspecified: Secondary | ICD-10-CM | POA: Diagnosis not present

## 2015-02-28 DIAGNOSIS — R339 Retention of urine, unspecified: Secondary | ICD-10-CM | POA: Diagnosis not present

## 2015-03-04 ENCOUNTER — Encounter: Payer: Self-pay | Admitting: Internal Medicine

## 2015-03-15 ENCOUNTER — Encounter: Payer: Self-pay | Admitting: Family Medicine

## 2015-03-15 ENCOUNTER — Ambulatory Visit (INDEPENDENT_AMBULATORY_CARE_PROVIDER_SITE_OTHER): Payer: Medicare Other | Admitting: Family Medicine

## 2015-03-15 VITALS — BP 124/61 | HR 76 | Temp 97.7°F | Ht 59.0 in | Wt 138.7 lb

## 2015-03-15 DIAGNOSIS — G629 Polyneuropathy, unspecified: Secondary | ICD-10-CM

## 2015-03-15 DIAGNOSIS — G608 Other hereditary and idiopathic neuropathies: Secondary | ICD-10-CM

## 2015-03-15 MED ORDER — GABAPENTIN 100 MG PO CAPS: 100.0000 mg | ORAL_CAPSULE | Freq: Three times a day (TID) | ORAL | Status: DC

## 2015-03-15 NOTE — Patient Instructions (Signed)
The pain you are having may by peripheral neuropathy, or nerve pain.  Start taking gabapentin 100mg  once a day (take after dinner time for the first few times) You can then take it in the morning and in the afternoon if you need it. No sooner than every 8 hours. You can increase the dose by 1 tablet every few days up to a max of 3 tablets a dose. Don't go more than this until you are seen by a doctor here for follow up.

## 2015-03-15 NOTE — Progress Notes (Signed)
   Subjective:    Patient ID: Nicole Perez, female    DOB: May 15, 1933, 81 y.o.   MRN: [id number]  HPI  CC: pain in both feet  # Bilateral foot pain:  Present for "a while", at least months but no longer than a year  Pain is crampy, burning, electric, tingling  Bottom of both feet all the way up to knees, no specific areas worse than others  Initially off and on, but persistent lately and gotten worse. Gets the symptoms every single night now  Has tried: soaking, alcohol bath, aleve, lotion, capsaicin, aleve, norco  Not diagnosed with diabetes, but does have spinal stenosis  Has had EMG in the past with noted peroneal neuropathy ROS: no bowel or bladder incontinence   Review of Systems   See HPI for ROS. All other systems reviewed and are negative.  Past medical history, surgical, family, and social history reviewed and updated in the EMR as appropriate. Objective:  BP 124/61 mmHg  Pulse 76  Temp(Src) 97.7 F (36.5 C) (Oral)  Ht 4\' 11"  (1.499 m)  Wt 138 lb 11.2 oz (62.914 kg)  BMI 28.00 kg/m2  LMP 12/17/1962 Vitals and nursing note reviewed  General: NAD MSK: no swelling in feet. ROM intact foot and ankles b/l. Nontender to palpation ankle, tarsal, metatarsal, no pain with heel palpation or palpa of 3rd/4th metatarsal heads. Skin: no rashes, no erythema  Assessment & Plan:  See Problem List Documentation

## 2015-03-16 NOTE — Assessment & Plan Note (Signed)
Pt with continued complaints and no relief with previous treatments. Today she is willing to try gabapentin. Will start low dose at night to assess toleration, then can titrate first with BID/TID dosing then titrate up dose (see AVS). Pt did request early refill of her narcotic medication because she was apparently taking it 3 times a day while on the bottle it was supposed to be q12hr, but discussed with her that this would need to be done by her PCP. F/u 1 month for reassessment of gabapentin effectiveness.

## 2015-03-22 ENCOUNTER — Other Ambulatory Visit: Payer: Self-pay | Admitting: Family Medicine

## 2015-03-22 ENCOUNTER — Encounter: Payer: Self-pay | Admitting: Internal Medicine

## 2015-03-22 ENCOUNTER — Ambulatory Visit (AMBULATORY_SURGERY_CENTER): Payer: Medicare Other | Admitting: Internal Medicine

## 2015-03-22 VITALS — BP 120/71 | HR 163 | Temp 99.9°F | Resp 20 | Ht 59.0 in | Wt 138.0 lb

## 2015-03-22 DIAGNOSIS — K2950 Unspecified chronic gastritis without bleeding: Secondary | ICD-10-CM | POA: Diagnosis not present

## 2015-03-22 DIAGNOSIS — K5909 Other constipation: Secondary | ICD-10-CM | POA: Diagnosis not present

## 2015-03-22 DIAGNOSIS — D124 Benign neoplasm of descending colon: Secondary | ICD-10-CM

## 2015-03-22 DIAGNOSIS — B9681 Helicobacter pylori [H. pylori] as the cause of diseases classified elsewhere: Secondary | ICD-10-CM | POA: Diagnosis not present

## 2015-03-22 DIAGNOSIS — K2970 Gastritis, unspecified, without bleeding: Secondary | ICD-10-CM

## 2015-03-22 DIAGNOSIS — K921 Melena: Secondary | ICD-10-CM

## 2015-03-22 DIAGNOSIS — D122 Benign neoplasm of ascending colon: Secondary | ICD-10-CM | POA: Diagnosis not present

## 2015-03-22 DIAGNOSIS — K2990 Gastroduodenitis, unspecified, without bleeding: Secondary | ICD-10-CM

## 2015-03-22 DIAGNOSIS — K623 Rectal prolapse: Secondary | ICD-10-CM | POA: Diagnosis not present

## 2015-03-22 DIAGNOSIS — R195 Other fecal abnormalities: Secondary | ICD-10-CM | POA: Diagnosis not present

## 2015-03-22 DIAGNOSIS — K5900 Constipation, unspecified: Secondary | ICD-10-CM | POA: Diagnosis not present

## 2015-03-22 DIAGNOSIS — D123 Benign neoplasm of transverse colon: Secondary | ICD-10-CM | POA: Diagnosis not present

## 2015-03-22 MED ORDER — SODIUM CHLORIDE 0.9 % IV SOLN: 500.0000 mL | INTRAVENOUS | Status: DC

## 2015-03-22 MED ORDER — PANTOPRAZOLE SODIUM 40 MG PO TBEC: 40.0000 mg | DELAYED_RELEASE_TABLET | Freq: Every day | ORAL | Status: DC

## 2015-03-22 NOTE — Progress Notes (Signed)
To recovery, report to Myers, RN VSS 

## 2015-03-22 NOTE — Progress Notes (Signed)
Called to room to assist during endoscopic procedure.  Patient ID and intended procedure confirmed with present staff. Received instructions for my participation in the procedure from the performing physician.  

## 2015-03-22 NOTE — Op Note (Signed)
[facility]  [address] [place], [id number]   COLONOSCOPY PROCEDURE REPORT  PATIENT: Nicole Perez, Nicole Perez  MR#: [id number] BIRTHDATE: February 04, 1933 , 81  yrs. old GENDER: female ENDOSCOPIST: Jerene Bears, MD REFERRED BO:FBPZ McDiarmid, M.D. PROCEDURE DATE:  03/22/2015 PROCEDURE:   Colonoscopy with snare polypectomy First Screening Colonoscopy - Avg.  risk and is 50 yrs.  old or older - No.  Prior Negative Screening - Now for repeat screening. N/A  History of Adenoma - Now for follow-up colonoscopy & has been > or = to 3 yrs.  Yes hx of adenoma.  Has been 3 or more years since last colonoscopy. ASA CLASS:   Class III INDICATIONS:heme positive stool, constipation, melena, history of hyperplastic colon polyps, rectal prolapse, last colonoscopy 2006.  MEDICATIONS: Monitored anesthesia care, Residual sedation present, and Propofol 180 mg IV  DESCRIPTION OF PROCEDURE:   After the risks benefits and alternatives of the procedure were thoroughly explained, informed consent was obtained.  The digital rectal exam revealed no rectal mass with decreased rectal tone.   The LB [id number] endoscope was introduced through the anus and advanced to the cecum, which was identified by both the appendix and ileocecal valve. No adverse events experienced.   The quality of the prep was (MoviPrep was used) good.  The instrument was then slowly withdrawn as the colon was fully examined.  COLON FINDINGS: Eight sessile polyps ranging between 3-64mm in size were found in the ascending colon (2), transverse colon (3), and descending colon (2).  Polypectomies were performed with a cold snare.  The resection was complete, the polyp tissue was completely retrieved and sent to histology.   Multiple small and diminutive sessile polyps (hyperplastic in appearance) were found in the rectosigmoid colon. Given history of hyperplastic rectal and distal sigmoid polyps, polypectomy not performed.   There was mild diverticulosis noted in the transverse colon, descending colon, and sigmoid colon.  There were venous blebs without varices in the rectum. Retroflexed views revealed no overt abnormalities but was limited by inability to retain air. The time to cecum = 4.4 Withdrawal time = 14.4   The scope was withdrawn and the procedure completed. COMPLICATIONS: There were no immediate complications.  ENDOSCOPIC IMPRESSION: 1.   Eight sessile polyps ranging between 3-28mm in size were found in the ascending colon, transverse colon, and descending colon; polypectomies were performed with a cold snare 2.   Multiple sessile polyps were found in the rectosigmoid colon 3.   Mild diverticulosis was noted in the transverse colon, descending colon, and sigmoid colon  RECOMMENDATIONS: 1.  Await pathology results 2.  High fiber diet 3.  Can continue milk of magnesia given its success in helping with constipation 4.  Surgical referral to Dr.  Leighton Ruff for evaluation of symptomatic rectal prolapse  eSigned:  Jerene Bears, MD 03/22/2015 4:28 PM rcc: Lissa Morales, MD and The Patient  PATIENT NAME:  [name], [name] MR#: [id number]

## 2015-03-22 NOTE — Op Note (Signed)
[facility]  [address]. [place], [id number]   ENDOSCOPY PROCEDURE REPORT  PATIENT: Nicole, Perez  MR#: [id number] BIRTHDATE: [birth year] , 81  yrs. old GENDER: female ENDOSCOPIST: Jerene Bears, MD REFERRED BY:  Lissa Morales, M.D. PROCEDURE DATE:  03/22/2015 PROCEDURE:  EGD w/ biopsy ASA CLASS:     Class III INDICATIONS:  melena and heme positive stool. MEDICATIONS: Monitored anesthesia care and Propofol 140 mg IV TOPICAL ANESTHETIC: none  DESCRIPTION OF PROCEDURE: After the risks benefits and alternatives of the procedure were thoroughly explained, informed consent was obtained.  The LB [id number] endoscope was introduced through the mouth and advanced to the second portion of the duodenum , Without limitations.  The instrument was slowly withdrawn as the mucosa was fully examined.   ESOPHAGUS: The mucosa of the esophagus appeared normal.  STOMACH: A large hiatal hernia was noted.   Moderate erosive gastritis (inflammation) was found in the entire examined stomach. There was adherent heme present and one superficial, small ulceration in the gastric antrum.  Multiple biopsies were performed using cold forceps (fundus, body and antrum).  DUODENUM: The duodenal mucosa showed no abnormalities in the bulb and 2nd part of the duodenum.  Retroflexed views revealed a hiatal hernia.     The scope was then withdrawn from the patient and the procedure completed.  COMPLICATIONS: There were no immediate complications.  ENDOSCOPIC IMPRESSION: 1.   The mucosa of the esophagus appeared normal 2.   Large hiatal hernia 3.   Erosive gastritis (inflammation) was found in the entire examined stomach; multiple biopsies were performed 4.   The duodenal mucosa showed no abnormalities in the bulb and 2nd part of the duodenum  RECOMMENDATIONS: 1.  Await biopsy results 2.  Follow-up of helicobacter pylori status, treat if indicated 3.  Avoid NSAIDs 4.   Pantoprazole 40 mg daily (30 min to 1 hour before breakfast)   eSigned:  Jerene Bears, MD 03/22/2015 4:14 PM     NB:VAPO McDiarmid, MD and The Patient

## 2015-03-22 NOTE — Patient Instructions (Addendum)
YOU HAD AN ENDOSCOPIC PROCEDURE TODAY AT THE [facility]:   Refer to the procedure report that was given to you for any specific questions about what was found during the examination.  If the procedure report does not answer your questions, please call your gastroenterologist to clarify.  If you requested that your care partner not be given the details of your procedure findings, then the procedure report has been included in a sealed envelope for you to review at your convenience later.  YOU SHOULD EXPECT: Some feelings of bloating in the abdomen. Passage of more gas than usual.  Walking can help get rid of the air that was put into your GI tract during the procedure and reduce the bloating. If you had a lower endoscopy (such as a colonoscopy or flexible sigmoidoscopy) you may notice spotting of blood in your stool or on the toilet paper. If you underwent a bowel prep for your procedure, you may not have a normal bowel movement for a few days.  Please Note:  You might notice some irritation and congestion in your nose or some drainage.  This is from the oxygen used during your procedure.  There is no need for concern and it should clear up in a day or so.  SYMPTOMS TO REPORT IMMEDIATELY:   Following lower endoscopy (colonoscopy or flexible sigmoidoscopy):  Excessive amounts of blood in the stool  Significant tenderness or worsening of abdominal pains  Swelling of the abdomen that is new, acute  Fever of 100F or higher   Following upper endoscopy (EGD)  Vomiting of blood or coffee ground material  New chest pain or pain under the shoulder blades  Painful or persistently difficult swallowing  New shortness of breath  Fever of 100F or higher  Black, tarry-looking stools  For urgent or emergent issues, a gastroenterologist can be reached at any hour by calling [phone].   DIET: Your first meal following the procedure should be a small meal and then it is ok to progress to  your normal diet. Heavy or fried foods are harder to digest and may make you feel nauseous or bloated.  Likewise, meals heavy in dairy and vegetables can increase bloating.  Drink plenty of fluids but you should avoid alcoholic beverages for 24 hours.  ACTIVITY:  You should plan to take it easy for the rest of today and you should NOT DRIVE or use heavy machinery until tomorrow (because of the sedation medicines used during the test).    FOLLOW UP: Our staff will call the number listed on your records the next business day following your procedure to check on you and address any questions or concerns that you may have regarding the information given to you following your procedure. If we do not reach you, we will leave a message.  However, if you are feeling well and you are not experiencing any problems, there is no need to return our call.  We will assume that you have returned to your regular daily activities without incident.  If any biopsies were taken you will be contacted by phone or by letter within the next 1-3 weeks.  Please call us at [phone] if you have not heard about the biopsies in 3 weeks.    SIGNATURES/CONFIDENTIALITY: You and/or your care partner have signed paperwork which will be entered into your electronic medical record.  These signatures attest to the fact that that the information above on your After Visit Summary has been reviewed   and is understood.  Full responsibility of the confidentiality of this discharge information lies with you and/or your care-partner.  Recommendations Discharge instructions given to patient and/or care partner. Polyp, gastritis, diverticulosis, and high fiber diet handouts provided. Avoid all NSAID's (aspirin, aspirin products, and anti-inflammatory drugs. Pantoprazole 40 mg, take 30 minutes to 1 hour before breakfast daily.

## 2015-03-23 ENCOUNTER — Other Ambulatory Visit: Payer: Self-pay | Admitting: Family Medicine

## 2015-03-23 ENCOUNTER — Telehealth: Payer: Self-pay

## 2015-03-23 DIAGNOSIS — M159 Polyosteoarthritis, unspecified: Secondary | ICD-10-CM

## 2015-03-23 DIAGNOSIS — M150 Primary generalized (osteo)arthritis: Secondary | ICD-10-CM

## 2015-03-23 DIAGNOSIS — M170 Bilateral primary osteoarthritis of knee: Secondary | ICD-10-CM

## 2015-03-23 DIAGNOSIS — M160 Bilateral primary osteoarthritis of hip: Secondary | ICD-10-CM

## 2015-03-23 DIAGNOSIS — G629 Polyneuropathy, unspecified: Secondary | ICD-10-CM

## 2015-03-23 DIAGNOSIS — M549 Dorsalgia, unspecified: Secondary | ICD-10-CM

## 2015-03-23 DIAGNOSIS — M47812 Spondylosis without myelopathy or radiculopathy, cervical region: Secondary | ICD-10-CM

## 2015-03-23 DIAGNOSIS — G608 Other hereditary and idiopathic neuropathies: Secondary | ICD-10-CM

## 2015-03-23 DIAGNOSIS — M4804 Spinal stenosis, thoracic region: Secondary | ICD-10-CM

## 2015-03-23 DIAGNOSIS — G8929 Other chronic pain: Secondary | ICD-10-CM

## 2015-03-23 DIAGNOSIS — M48061 Spinal stenosis, lumbar region without neurogenic claudication: Secondary | ICD-10-CM

## 2015-03-23 NOTE — Telephone Encounter (Signed)
Left message on answering machine. 

## 2015-03-23 NOTE — Telephone Encounter (Signed)
Pt scheduled to see Dr. Henri Medal with [facility] for rectal prolapse 04/14/15 @11 :20am, pt to arrive there at 10:50am. Pt aware of appt.

## 2015-03-23 NOTE — Telephone Encounter (Signed)
Needs refill on hydrocodone

## 2015-03-24 ENCOUNTER — Other Ambulatory Visit: Payer: Self-pay | Admitting: *Deleted

## 2015-03-24 MED ORDER — HYDROCODONE-ACETAMINOPHEN 10-325 MG PO TABS: 1.0000 | ORAL_TABLET | Freq: Two times a day (BID) | ORAL | Status: DC | PRN

## 2015-03-24 NOTE — Telephone Encounter (Signed)
LM for patient that rx is ready for pick up. Jaemarie Hochberg,CMA  

## 2015-03-25 MED ORDER — DILTIAZEM HCL ER 180 MG PO CP24: 180.0000 mg | ORAL_CAPSULE | Freq: Every day | ORAL | Status: DC

## 2015-03-29 ENCOUNTER — Other Ambulatory Visit: Payer: Self-pay

## 2015-03-29 ENCOUNTER — Telehealth: Payer: Self-pay | Admitting: *Deleted

## 2015-03-29 ENCOUNTER — Telehealth: Payer: Self-pay | Admitting: Family Medicine

## 2015-03-29 ENCOUNTER — Encounter: Payer: Self-pay | Admitting: Internal Medicine

## 2015-03-29 MED ORDER — BIS SUBCIT-METRONID-TETRACYC 140-125-125 MG PO CAPS: 3.0000 | ORAL_CAPSULE | Freq: Three times a day (TID) | ORAL | Status: DC

## 2015-03-29 MED ORDER — AMOXICILL-CLARITHRO-LANSOPRAZ PO MISC: Freq: Two times a day (BID) | ORAL | Status: DC

## 2015-03-29 NOTE — Telephone Encounter (Signed)
Spoke with daughter and patient was originally taking 240mg  of diltiazem but 180mg  was called in. [employer]

## 2015-03-29 NOTE — Telephone Encounter (Signed)
We received a message from [facility] about the Falls script sent over earlier today. "plasma concentrations of lansoprazole/amoxicillin/clarithryomycin pack card may be increased by concurrent use of diltiazem er 180 mg caps (xr-24 h) increaseing the risk for sudden death from cardiac issues."  Per Dr Hilarie Fredrickson, given risk of prevpak and diltiazem interaction, please change script to Pylera. New rx for Pylera sent to pharmacy. I have spoken to [place] at [employer] to discontinue Prevpak. She verbalizes understanding. I also spoke with patient's daughter to advise of this.

## 2015-03-29 NOTE — Telephone Encounter (Signed)
Daughter called because the wrong BP medication was called in. The milligrams are wrong. jw

## 2015-03-30 ENCOUNTER — Encounter: Payer: Self-pay | Admitting: *Deleted

## 2015-03-30 NOTE — Telephone Encounter (Signed)
Please let daughter of Nicole Perez know that patient's diltiazem dose was reduced from 240 mg to 180 mg during office visit with Dr Yousif Edelson in October 2015 for falls and low blood pressure with standing.   Recommend not increasing diltiazem dose back to 240 mg to avoid patient passing out with standing.

## 2015-03-30 NOTE — Telephone Encounter (Signed)
Spoke with daughter and she voiced understanding. Burnis Kaser,CMA

## 2015-03-30 NOTE — Telephone Encounter (Signed)
Per [employer], patient's Pylera rx has been approved through 12/17/15. Reference # [id number].

## 2015-04-01 ENCOUNTER — Telehealth: Payer: Self-pay | Admitting: Internal Medicine

## 2015-04-01 NOTE — Telephone Encounter (Signed)
Patient states that the pharmacy told her yesterday around 4:30 pm that her Pylera was going to be $600 and that insurance did not cover the medication. I got a prior authorization from insurance with approval 03/30/15. I contacted pharmacy for patient and was told that the medication still wouldn't go through. I offered a PA number and was told that they would try to run the medication through again before calling insurance. At that time, pharmacy told me that medication did go through for $7.40. I have advised patient of this.

## 2015-04-04 ENCOUNTER — Telehealth: Payer: Self-pay | Admitting: Internal Medicine

## 2015-04-04 NOTE — Telephone Encounter (Signed)
Discussed with pt that her stools are black due to the pylera-bismuth. Pt instructed to contact her PCP for the dark urine. Pt verbalized understanding.

## 2015-04-12 ENCOUNTER — Telehealth: Payer: Self-pay | Admitting: Family Medicine

## 2015-04-12 NOTE — Telephone Encounter (Signed)
Had colonscopy and endoscopy about a week ago. Was given pylera to take because she has an infection She takes 12 capsules a day and they make her so sick Wants to know if colonscopy dr has been in touch with dr Wendy Poet She can hardly get up or walk Please advise

## 2015-04-12 NOTE — Telephone Encounter (Signed)
Will forward to PCP for review. Briann Sarchet, CMA. 

## 2015-04-19 ENCOUNTER — Other Ambulatory Visit: Payer: Self-pay | Admitting: Family Medicine

## 2015-04-19 DIAGNOSIS — G8929 Other chronic pain: Secondary | ICD-10-CM

## 2015-04-19 DIAGNOSIS — M150 Primary generalized (osteo)arthritis: Secondary | ICD-10-CM

## 2015-04-19 DIAGNOSIS — G608 Other hereditary and idiopathic neuropathies: Secondary | ICD-10-CM

## 2015-04-19 DIAGNOSIS — M4804 Spinal stenosis, thoracic region: Secondary | ICD-10-CM

## 2015-04-19 DIAGNOSIS — M48061 Spinal stenosis, lumbar region without neurogenic claudication: Secondary | ICD-10-CM

## 2015-04-19 DIAGNOSIS — M170 Bilateral primary osteoarthritis of knee: Secondary | ICD-10-CM

## 2015-04-19 DIAGNOSIS — M47812 Spondylosis without myelopathy or radiculopathy, cervical region: Secondary | ICD-10-CM

## 2015-04-19 DIAGNOSIS — G629 Polyneuropathy, unspecified: Secondary | ICD-10-CM

## 2015-04-19 DIAGNOSIS — M549 Dorsalgia, unspecified: Secondary | ICD-10-CM

## 2015-04-19 DIAGNOSIS — M160 Bilateral primary osteoarthritis of hip: Secondary | ICD-10-CM

## 2015-04-19 DIAGNOSIS — M159 Polyosteoarthritis, unspecified: Secondary | ICD-10-CM

## 2015-04-19 MED ORDER — HYDROCODONE-ACETAMINOPHEN 10-325 MG PO TABS: 1.0000 | ORAL_TABLET | Freq: Two times a day (BID) | ORAL | Status: DC | PRN

## 2015-04-19 NOTE — Telephone Encounter (Signed)
Needs hydrocodene refilled

## 2015-04-19 NOTE — Telephone Encounter (Signed)
Please let patient know her prescription(s) are available for pick up from the [facility] front desk.  

## 2015-04-19 NOTE — Telephone Encounter (Signed)
Pt is aware.  Jazmin Hartsell,CMA  

## 2015-04-19 NOTE — Telephone Encounter (Signed)
Pt is aware.  Nicole Perez,CMA  

## 2015-04-21 ENCOUNTER — Ambulatory Visit: Payer: Medicare Other | Admitting: Family Medicine

## 2015-04-21 DIAGNOSIS — N3020 Other chronic cystitis without hematuria: Secondary | ICD-10-CM | POA: Diagnosis not present

## 2015-04-21 DIAGNOSIS — R3912 Poor urinary stream: Secondary | ICD-10-CM | POA: Diagnosis not present

## 2015-04-21 DIAGNOSIS — N3944 Nocturnal enuresis: Secondary | ICD-10-CM | POA: Diagnosis not present

## 2015-05-02 DIAGNOSIS — N3946 Mixed incontinence: Secondary | ICD-10-CM | POA: Diagnosis not present

## 2015-05-02 DIAGNOSIS — N3020 Other chronic cystitis without hematuria: Secondary | ICD-10-CM | POA: Diagnosis not present

## 2015-05-02 DIAGNOSIS — N3944 Nocturnal enuresis: Secondary | ICD-10-CM | POA: Diagnosis not present

## 2015-05-05 ENCOUNTER — Encounter: Payer: Self-pay | Admitting: Family Medicine

## 2015-05-05 ENCOUNTER — Ambulatory Visit (INDEPENDENT_AMBULATORY_CARE_PROVIDER_SITE_OTHER): Payer: Medicare Other | Admitting: Family Medicine

## 2015-05-05 ENCOUNTER — Other Ambulatory Visit: Payer: Self-pay | Admitting: Family Medicine

## 2015-05-05 VITALS — BP 110/85 | HR 65 | Temp 98.5°F | Ht 59.0 in | Wt 141.0 lb

## 2015-05-05 DIAGNOSIS — Z8639 Personal history of other endocrine, nutritional and metabolic disease: Secondary | ICD-10-CM

## 2015-05-05 DIAGNOSIS — R413 Other amnesia: Secondary | ICD-10-CM

## 2015-05-05 DIAGNOSIS — K449 Diaphragmatic hernia without obstruction or gangrene: Secondary | ICD-10-CM | POA: Insufficient documentation

## 2015-05-05 DIAGNOSIS — Z860100 Personal history of colon polyps, unspecified: Secondary | ICD-10-CM

## 2015-05-05 DIAGNOSIS — I10 Essential (primary) hypertension: Secondary | ICD-10-CM | POA: Diagnosis not present

## 2015-05-05 DIAGNOSIS — N319 Neuromuscular dysfunction of bladder, unspecified: Secondary | ICD-10-CM | POA: Diagnosis not present

## 2015-05-05 DIAGNOSIS — D509 Iron deficiency anemia, unspecified: Secondary | ICD-10-CM

## 2015-05-05 HISTORY — DX: Diaphragmatic hernia without obstruction or gangrene: K44.9

## 2015-05-05 HISTORY — DX: Personal history of colonic polyps: Z86.010

## 2015-05-05 HISTORY — DX: Personal history of colon polyps, unspecified: Z86.0100

## 2015-05-05 LAB — BASIC METABOLIC PANEL
BUN: 18 mg/dL (ref 6–23)
CHLORIDE: 100 meq/L (ref 96–112)
CO2: 25 mEq/L (ref 19–32)
CREATININE: 1.01 mg/dL (ref 0.50–1.10)
Calcium: 9.1 mg/dL (ref 8.4–10.5)
Glucose, Bld: 110 mg/dL — ABNORMAL HIGH (ref 70–99)
Potassium: 3.7 mEq/L (ref 3.5–5.3)
Sodium: 138 mEq/L (ref 135–145)

## 2015-05-05 NOTE — Assessment & Plan Note (Signed)
Adequate blood pressure control.  No evidence of new end organ damage.  Tolerating medication without significant adverse effects.  Plan to continue current blood pressure regiment.   

## 2015-05-05 NOTE — Progress Notes (Signed)
   Subjective:    Patient ID: Nicole Perez, female    DOB: 1933/04/02, 81 y.o.   MRN: [id number]  HPI  Nicole Perez is accompanied by her daughter, Nicole Perez. Both individuals were sources of information for the visit.;  Nicole Perez has been busy interfacing with the health system since I last saw her in the Fall of last year.   I have received clinical information from Nicole Perez (GI) who performed an EGD and Colonoscopy on our offices referral for evaluation of melena and heme-positive stool which found eight sessile polyps ranging between 3-7mm in size were found in the ascending colon, transverse colon, and descending colon; polypectomies were performed with a cold snare;  Multiple sessile polyps were found in the rectosigmoid colon and Mild diverticulosis was noted in the transverse colon, descending colon, and sigmoid colon EGD found gastritis and apparently H. Pylori positive testing as Nicole Perez started patient on Pylera.  Patient has nearly completed the two weeks of Pylera.   Neurogenic Bladder As best as I can tell (as I have received no letters nor office visit notes from the specialist who have seen Nicole Perez), Nicole Perez was referred by the Urogynecologists to Nicole Perez (OBGYN in [facility]) who referred patient to Nicole Perez (Urology) who is treating Nicole Perez for a UTI with sequential treatments of Trimethoprim and then Cipro. Nicole Perez is concerned that she may have Kidney disease from her neurogenic bladder and will need Hemodialysis in the future.  Patient continues to self cath twice a day.  Urine is yellow and clear. No abdominal or back pain.    Essential hypertension, benign (Chronic) -Disease Monitoring Blood pressure range: not checking at home  Chest pain: no  Dyspnea: no  Claudication: no   Medication compliance: yes, pt has reduced dose of her Diltiazem to 180 XR daily.  She is not taking HCTZ.  Medication Side  Effects Lightheadedness: no  Urinary frequency: no  Edema: no    Preventitive Healthcare: Exercise: yes, taking HEP strengthening and balance training at home   Chronic back pain - Longstanding problem - Patient with severe lumbar spinal stenosis and thoracic stenosis. - When she takes a Vicodan 10/325 in moring it helps reduce knee and back pain enough to allow her to perform her ADLs and iADLs - Occasionally takes Ibuprofen (otc) one tablet daily  - Able to perform ADLs    No Falls since last year No smoking  Review of Systems     Objective:   Physical Exam VS reviewed GEN: Alert, Cooperative, Groomed, NAD, Wearing her left ear hearing aid HEENT: PERRL; EAC bilaterally not occluded, TM's translucent with normal LM, (+) LR;   COR: RRR, No M/G/R, No JVD, Normal PMI size and location LUNGS: BCTA, No Acc mm use, speaking in full sentences EXT: No peripheral leg edema. Feet without deformity or lesions. Palpable bilateral pedal pulses.  Gait: Using walker No significant path deviation, No step through on swing phase bilaterally,  Psych: Normal affect/thought/speech/language       Assessment & Plan:

## 2015-05-05 NOTE — Assessment & Plan Note (Signed)
Stable Being treated for UTI by Dr Matilde Sprang (Urol) with trimethoprim then cipro courses Continues to self cath twice daily successfully.

## 2015-05-05 NOTE — Patient Instructions (Signed)
Your Blood pressure looks good.  Keep taking your Diltiazem and HCTZ medications.  Dr Sadie Pickar will call you if your tests of your kidneys are not good. Otherwise he will send you a letter.  If you sign up for MyChart online, you will be able to see your test results once Dr Jaiceon Collister has reviewed them.  If you do not hear from [place] with in 2 weeks please call our office

## 2015-05-06 ENCOUNTER — Encounter: Payer: Self-pay | Admitting: Family Medicine

## 2015-05-19 ENCOUNTER — Other Ambulatory Visit: Payer: Self-pay | Admitting: Family Medicine

## 2015-05-19 DIAGNOSIS — M48061 Spinal stenosis, lumbar region without neurogenic claudication: Secondary | ICD-10-CM

## 2015-05-19 DIAGNOSIS — M159 Polyosteoarthritis, unspecified: Secondary | ICD-10-CM

## 2015-05-19 DIAGNOSIS — G608 Other hereditary and idiopathic neuropathies: Secondary | ICD-10-CM

## 2015-05-19 DIAGNOSIS — M4804 Spinal stenosis, thoracic region: Secondary | ICD-10-CM

## 2015-05-19 DIAGNOSIS — M170 Bilateral primary osteoarthritis of knee: Secondary | ICD-10-CM

## 2015-05-19 DIAGNOSIS — M47812 Spondylosis without myelopathy or radiculopathy, cervical region: Secondary | ICD-10-CM

## 2015-05-19 DIAGNOSIS — G629 Polyneuropathy, unspecified: Secondary | ICD-10-CM

## 2015-05-19 DIAGNOSIS — M549 Dorsalgia, unspecified: Secondary | ICD-10-CM

## 2015-05-19 DIAGNOSIS — M150 Primary generalized (osteo)arthritis: Secondary | ICD-10-CM

## 2015-05-19 DIAGNOSIS — M160 Bilateral primary osteoarthritis of hip: Secondary | ICD-10-CM

## 2015-05-19 DIAGNOSIS — G8929 Other chronic pain: Secondary | ICD-10-CM

## 2015-05-19 NOTE — Telephone Encounter (Signed)
Refill request from pt. Will forward to PCP for review. Warda Mcqueary, CMA. 

## 2015-05-19 NOTE — Telephone Encounter (Signed)
Pt called and needs a refill on her Hydrocodone left up front. jw

## 2015-05-23 ENCOUNTER — Ambulatory Visit (INDEPENDENT_AMBULATORY_CARE_PROVIDER_SITE_OTHER): Payer: Medicare Other | Admitting: Internal Medicine

## 2015-05-23 ENCOUNTER — Encounter: Payer: Self-pay | Admitting: Internal Medicine

## 2015-05-23 VITALS — BP 118/62 | HR 52 | Ht 59.0 in | Wt 139.0 lb

## 2015-05-23 DIAGNOSIS — D126 Benign neoplasm of colon, unspecified: Secondary | ICD-10-CM | POA: Diagnosis not present

## 2015-05-23 DIAGNOSIS — K2970 Gastritis, unspecified, without bleeding: Principal | ICD-10-CM

## 2015-05-23 DIAGNOSIS — K449 Diaphragmatic hernia without obstruction or gangrene: Secondary | ICD-10-CM | POA: Diagnosis not present

## 2015-05-23 DIAGNOSIS — K623 Rectal prolapse: Secondary | ICD-10-CM

## 2015-05-23 DIAGNOSIS — K5900 Constipation, unspecified: Secondary | ICD-10-CM | POA: Diagnosis not present

## 2015-05-23 DIAGNOSIS — B9681 Helicobacter pylori [H. pylori] as the cause of diseases classified elsewhere: Secondary | ICD-10-CM | POA: Diagnosis not present

## 2015-05-23 MED ORDER — HYDROCODONE-ACETAMINOPHEN 10-325 MG PO TABS: 1.0000 | ORAL_TABLET | Freq: Two times a day (BID) | ORAL | Status: DC | PRN

## 2015-05-23 NOTE — Patient Instructions (Signed)
Use milk of magnesia every 3 days to prevent constipation.   Follow up with [place] as needed.    Call us if any questions.    I appreciate the opportunity to care for you.

## 2015-05-23 NOTE — Telephone Encounter (Signed)
Needs refill on tramadol  °

## 2015-05-23 NOTE — Progress Notes (Signed)
Subjective:    Patient ID: Nicole Perez, female    DOB: Dec 22, 1932, 81 y.o.   MRN: [id number]  HPI Nicole Perez is a 81 year old female with a past medical history of large hiatal hernia, H. pylori gastritis status post treatment, colon polyps, rectal prolapse, osteoarthritis, hypertension, urinary retention who is seen in follow-up. She was initially seen in March of this shear to evaluate melena and heme positive stool. She came for upper endoscopy after that appointment on 03/22/2015. Upper endoscopy showed normal esophagus, large hiatal hernia and erosive gastritis. Biopsies were positive for H. pylori and she was treated with Pylera. Colonoscopy on the same day showed 8 sessile polyps ranging in size from 3-5 mm in the ascending, transverse and descending colon. Polypectomies were performed with cold snare. There were multiple very distal polyps not removed felt to be hyperplastic polyps. Mild diverticulosis was seen in the transverse and left colon. Pathology results showed tubular adenomas in sessile serrated polyps. She was referred to Dr. Marcello Moores for evaluation of rectal prolapse causing symptoms of pelvic discomfort but this appointment was canceled because she is undergoing urologic evaluation for possible bladder tact.   She returns today with her daughter. She completed Pylera though this was difficult for her to take. This has resulted in improving epigastric and upper abdominal pain. No further black stools. She continues to have issues with constipation but is using milk of magnesia with great success. She uses this every 2-3 days. She continues intermittent in and out self cath every 12 hours. She reports good appetite though at times it seems that she cannot eat a full meal.   Review of Systems As per history of present illness, otherwise negative  Current Medications, Allergies, Past Medical History, Past Surgical History, Family History and Social History were reviewed in [employer] record.     Objective:   Physical Exam BP 118/62 mmHg  Pulse 52  Ht 4\' 11"  (1.499 m)  Wt 139 lb (63.05 kg)  BMI 28.06 kg/m2  LMP 12/17/1962 Constitutional: Well-developed and well-nourished. No distress. HEENT: Normocephalic and atraumatic. Oropharynx is clear and moist. No oropharyngeal exudate. Conjunctivae are normal.  No scleral icterus. Neck: Neck supple. Trachea midline. Cardiovascular: Normal rate, regular rhythm and intact distal pulses. No M/R/G Pulmonary/chest: Effort normal and breath sounds normal. No wheezing, rales or rhonchi. Abdominal: Soft, nontender, nondistended. Bowel sounds active throughout. There are no masses palpable. No hepatosplenomegaly. Extremities: no clubbing, cyanosis, or edema Lymphadenopathy: No cervical adenopathy noted. Neurological: Alert and oriented to person place and time. Skin: Skin is warm and dry. No rashes noted. Psychiatric: Normal mood and affect. Behavior is normal.  Prior cross-sectional imaging confirmed rectal prolapse  CBC    Component Value Date/Time   WBC 7.3 12/18/2014 1452   RBC 4.61 12/18/2014 1452   HGB 12.7 12/18/2014 1452   HCT 40.1 12/18/2014 1452   PLT 130* 12/18/2014 1452   MCV 87.0 12/18/2014 1452   MCH 27.5 12/18/2014 1452   MCHC 31.7 12/18/2014 1452   RDW 17.2* 12/18/2014 1452   LYMPHSABS 1.2 12/18/2014 1452   MONOABS 0.6 12/18/2014 1452   EOSABS 0.2 12/18/2014 1452   BASOSABS 0.0 12/18/2014 1452    CMP     Component Value Date/Time   NA 138 05/05/2015 1209   NA 143 11/05/2013 1437   K 3.7 05/05/2015 1209   CL 100 05/05/2015 1209   CO2 25 05/05/2015 1209   GLUCOSE 110* 05/05/2015 1209   GLUCOSE 99  11/05/2013 1437   BUN 18 05/05/2015 1209   BUN 14 11/05/2013 1437   CREATININE 1.01 05/05/2015 1209   CREATININE 0.87 12/18/2014 1452   CALCIUM 9.1 05/05/2015 1209   PROT 6.9 12/18/2014 1452   PROT 6.9 11/24/2013 1257   ALBUMIN 3.9 12/18/2014 1452   AST 20 12/18/2014 1452    ALT 12 12/18/2014 1452   ALKPHOS 70 12/18/2014 1452   BILITOT 0.4 12/18/2014 1452   GFRNONAA 62* 12/18/2014 1452   GFRAA 72* 12/18/2014 1452      Assessment & Plan:  81 year old female with a past medical history of large hiatal hernia, H. pylori gastritis status post treatment, colon polyps, rectal prolapse, osteoarthritis, hypertension, urinary retention who is seen in follow-up.  1. H. pylori gastritis with small ulcer -- likely cause of melena. Status post H. pylori treatment which she completed. She is feeling better without upper GI complaint at this time.  2. Rectal pressure/rectal prolapse and constipation -- her constipation is effectively treated with milk of magnesia. I recommended that she use this every 3 days to avoid constipation which would exacerbate rectal prolapse and likely lead to more pain. Rectal prolapse treatment is in general surgical though she may choose to avoid surgery which is reasonable. Surgical consultation is also reasonable and she will call to reschedule her appointment with Dr. Marcello Moores after she completes her urologic evaluation for urinary retention. Constipation is also exacerbated by narcotics which she takes for chronic arthritic pain.  3. History of adenomatous colon polyps -- given age 17 and recent colonoscopy, no plan for repeat surveillance colonoscopy based on age. She understands and agrees with this decision  4. Hiatal hernia -- large but stable. Likely cause of intermittent early satiety. Dietary modification as needed. No plans for hernia repair.  Follow-up as needed 25 minutes spent with patient today

## 2015-05-23 NOTE — Telephone Encounter (Signed)
Tramadol is not on patient's current medication list.  Will forward message to MD. Johnney Ou

## 2015-05-23 NOTE — Telephone Encounter (Signed)
Please let patient know her prescription(s) are available for pick up from the [facility] front desk.  

## 2015-05-23 NOTE — Telephone Encounter (Signed)
LM for patient that rx is ready for pick up. Bless Lisenby,CMA  

## 2015-06-02 ENCOUNTER — Other Ambulatory Visit: Payer: Self-pay | Admitting: Family Medicine

## 2015-06-09 ENCOUNTER — Ambulatory Visit (INDEPENDENT_AMBULATORY_CARE_PROVIDER_SITE_OTHER): Payer: Medicare Other | Admitting: Family Medicine

## 2015-06-09 ENCOUNTER — Encounter: Payer: Self-pay | Admitting: Family Medicine

## 2015-06-09 VITALS — BP 110/38 | HR 66 | Temp 98.2°F | Ht 59.0 in | Wt 139.7 lb

## 2015-06-09 DIAGNOSIS — R5383 Other fatigue: Secondary | ICD-10-CM | POA: Diagnosis not present

## 2015-06-09 DIAGNOSIS — N3000 Acute cystitis without hematuria: Secondary | ICD-10-CM

## 2015-06-09 LAB — POCT URINALYSIS DIPSTICK
Bilirubin, UA: NEGATIVE
Glucose, UA: NEGATIVE
KETONES UA: NEGATIVE
NITRITE UA: POSITIVE
PROTEIN UA: 30
SPEC GRAV UA: 1.025
Urobilinogen, UA: 0.2
pH, UA: 5.5

## 2015-06-09 LAB — POCT UA - MICROSCOPIC ONLY

## 2015-06-09 MED ORDER — SULFAMETHOXAZOLE-TRIMETHOPRIM 800-160 MG PO TABS: 1.0000 | ORAL_TABLET | Freq: Two times a day (BID) | ORAL | Status: DC

## 2015-06-09 NOTE — Progress Notes (Signed)
Patient ID: Nicole Perez, female   DOB: January 26, 1933, 81 y.o.   MRN: [id number]  HPI:  Pt presents for a same day appointment to discuss not feeling well.  On Monday took milk of magnesia but took generic version from the store and this made her vomit. Usually takes name brand. Since then has felt hot and cold. Had fever to 101 last night. No diarrhea. No dysuria but color of urine is deep yellow. Has hx of chronic urinary retention for which she self-caths on a regular basis. Has been on antibiotics chronically for neurogenic bladder. Started on ciprofloxacin on 6/17. No new back pain. No coughing. No further vomiting since Monday. Decreased appetite but drinking water. Feels tired and fatigued. No sick contacts. Lives by herself. Daughter accompanies to this visit. No rashes or tick bites.  ROS: See HPI  [place]: hx neurogenic bladder, HTN, HLD, arthritis  PHYSICAL EXAM: BP 110/38 mmHg  Pulse 66  Temp(Src) 98.2 F (36.8 C) (Oral)  Ht 4\' 11"  (1.499 m)  Wt 139 lb 11.2 oz (63.368 kg)  BMI 28.20 kg/m2  LMP 12/17/1962 Gen: NAD, pleasant, cooperative HEENT: NCAT, tongue slightly dry but buccal mucosa moist Heart: RRR no murmur Lungs: CTAB NWOB Abdomen: soft NTTP, no masses or organomegaly Neuro: grossly nonfocal, speech normal  ASSESSMENT/PLAN:  1. Fatigue and fever: with hx of neurogenic bladder and multiple prior UTI's, high risk for recurrent UTI. Catheterized urinalysis today consistent with UTI. On review of prior culture results, it appears most bacteria pt has previously grown have been sensitive to bactrim (exception: most recent culture Jan 2016 which grew pseudomonas). Pt has upcoming appt with urology on June 30. Pt well appearing and tolerating PO, safe to go home and complete oral antibiotics. - rx bactrim DS 1 tab BID x 10 days - send urine for culture - discussed return precautions with pt and daughter at length, per AVS  FOLLOW UP: F/u as needed if symptoms worsen or do not  improve.   [place]. Ardelia Mems, [place]

## 2015-06-09 NOTE — Patient Instructions (Signed)
Treating for urinary tract infection Take bactrim 1 tab twice a day for 10 days Follow up with urologist as scheduled Return if worsening: inability to eat/drink, back pain, further fevers, feeling worse, etc. Also return if not getting better  Be well, Dr. Ardelia Mems

## 2015-06-10 NOTE — Progress Notes (Signed)
I was available as preceptor to resident for this patient's office visit.  

## 2015-06-12 LAB — URINE CULTURE: Colony Count: 30000

## 2015-06-13 ENCOUNTER — Telehealth: Payer: Self-pay | Admitting: Family Medicine

## 2015-06-13 MED ORDER — CEPHALEXIN 500 MG PO CAPS: 500.0000 mg | ORAL_CAPSULE | Freq: Four times a day (QID) | ORAL | Status: DC

## 2015-06-13 NOTE — Telephone Encounter (Signed)
Called pt to discuss urine culture results. Grew 30k colonies of e coli that were resistant to both cipro and bactrim. Need to switch antibiotic. Pt reports she continues to feel ill. She did have a fever over the weekend. Able to drink normally, still decreased appetite. Denies new or different back pain. Discussed option of admission to the hospital if she is feeling badly, but she prefers to attempt outpatient treatment with new antibiotic.  Rx sent in for keflex 500mg  QID x 10 days. Advised if pt not feeling better within a few days she needs to call us to discuss further. Pt appreciative.  Leeanne Rio, MD

## 2015-06-14 NOTE — Telephone Encounter (Signed)
Reviewed

## 2015-06-16 DIAGNOSIS — N3020 Other chronic cystitis without hematuria: Secondary | ICD-10-CM | POA: Diagnosis not present

## 2015-06-16 DIAGNOSIS — R339 Retention of urine, unspecified: Secondary | ICD-10-CM | POA: Diagnosis not present

## 2015-06-21 ENCOUNTER — Telehealth: Payer: Self-pay | Admitting: Family Medicine

## 2015-06-21 DIAGNOSIS — M159 Polyosteoarthritis, unspecified: Secondary | ICD-10-CM

## 2015-06-21 DIAGNOSIS — M170 Bilateral primary osteoarthritis of knee: Secondary | ICD-10-CM

## 2015-06-21 DIAGNOSIS — M160 Bilateral primary osteoarthritis of hip: Secondary | ICD-10-CM

## 2015-06-21 DIAGNOSIS — M150 Primary generalized (osteo)arthritis: Secondary | ICD-10-CM

## 2015-06-21 DIAGNOSIS — G629 Polyneuropathy, unspecified: Secondary | ICD-10-CM

## 2015-06-21 DIAGNOSIS — G608 Other hereditary and idiopathic neuropathies: Secondary | ICD-10-CM

## 2015-06-21 DIAGNOSIS — G8929 Other chronic pain: Secondary | ICD-10-CM

## 2015-06-21 DIAGNOSIS — M48061 Spinal stenosis, lumbar region without neurogenic claudication: Secondary | ICD-10-CM

## 2015-06-21 DIAGNOSIS — M47812 Spondylosis without myelopathy or radiculopathy, cervical region: Secondary | ICD-10-CM

## 2015-06-21 DIAGNOSIS — M549 Dorsalgia, unspecified: Secondary | ICD-10-CM

## 2015-06-21 DIAGNOSIS — M4804 Spinal stenosis, thoracic region: Secondary | ICD-10-CM

## 2015-06-21 NOTE — Telephone Encounter (Signed)
Need rx for hydrocodone

## 2015-06-22 MED ORDER — HYDROCODONE-ACETAMINOPHEN 10-325 MG PO TABS: 1.0000 | ORAL_TABLET | Freq: Two times a day (BID) | ORAL | Status: DC | PRN

## 2015-06-22 NOTE — Telephone Encounter (Signed)
Patient is aware of this and daughter will come by and pick up. Jazmin Hartsell,CMA

## 2015-06-22 NOTE — Telephone Encounter (Signed)
Please let patient know her prescription(s) are available for pick up from the [facility] front desk.  

## 2015-07-05 DIAGNOSIS — K5909 Other constipation: Secondary | ICD-10-CM | POA: Diagnosis not present

## 2015-07-21 ENCOUNTER — Other Ambulatory Visit: Payer: Self-pay | Admitting: Family Medicine

## 2015-07-21 DIAGNOSIS — M160 Bilateral primary osteoarthritis of hip: Secondary | ICD-10-CM

## 2015-07-21 DIAGNOSIS — G608 Other hereditary and idiopathic neuropathies: Secondary | ICD-10-CM

## 2015-07-21 DIAGNOSIS — M549 Dorsalgia, unspecified: Secondary | ICD-10-CM

## 2015-07-21 DIAGNOSIS — M170 Bilateral primary osteoarthritis of knee: Secondary | ICD-10-CM

## 2015-07-21 DIAGNOSIS — M47812 Spondylosis without myelopathy or radiculopathy, cervical region: Secondary | ICD-10-CM

## 2015-07-21 DIAGNOSIS — M159 Polyosteoarthritis, unspecified: Secondary | ICD-10-CM

## 2015-07-21 DIAGNOSIS — M48061 Spinal stenosis, lumbar region without neurogenic claudication: Secondary | ICD-10-CM

## 2015-07-21 DIAGNOSIS — G629 Polyneuropathy, unspecified: Secondary | ICD-10-CM

## 2015-07-21 DIAGNOSIS — G8929 Other chronic pain: Secondary | ICD-10-CM

## 2015-07-21 DIAGNOSIS — M150 Primary generalized (osteo)arthritis: Secondary | ICD-10-CM

## 2015-07-21 DIAGNOSIS — M4804 Spinal stenosis, thoracic region: Secondary | ICD-10-CM

## 2015-07-21 MED ORDER — HYDROCODONE-ACETAMINOPHEN 10-325 MG PO TABS: 1.0000 | ORAL_TABLET | Freq: Two times a day (BID) | ORAL | Status: DC | PRN

## 2015-07-27 DIAGNOSIS — N3020 Other chronic cystitis without hematuria: Secondary | ICD-10-CM | POA: Diagnosis not present

## 2015-07-27 DIAGNOSIS — N3946 Mixed incontinence: Secondary | ICD-10-CM | POA: Diagnosis not present

## 2015-07-27 DIAGNOSIS — R339 Retention of urine, unspecified: Secondary | ICD-10-CM | POA: Diagnosis not present

## 2015-08-12 DIAGNOSIS — N3946 Mixed incontinence: Secondary | ICD-10-CM | POA: Diagnosis not present

## 2015-08-12 DIAGNOSIS — R339 Retention of urine, unspecified: Secondary | ICD-10-CM | POA: Diagnosis not present

## 2015-08-16 ENCOUNTER — Other Ambulatory Visit: Payer: Self-pay | Admitting: Family Medicine

## 2015-08-16 DIAGNOSIS — M4804 Spinal stenosis, thoracic region: Secondary | ICD-10-CM

## 2015-08-16 DIAGNOSIS — G8929 Other chronic pain: Secondary | ICD-10-CM

## 2015-08-16 DIAGNOSIS — M160 Bilateral primary osteoarthritis of hip: Secondary | ICD-10-CM

## 2015-08-16 DIAGNOSIS — G608 Other hereditary and idiopathic neuropathies: Secondary | ICD-10-CM

## 2015-08-16 DIAGNOSIS — M48061 Spinal stenosis, lumbar region without neurogenic claudication: Secondary | ICD-10-CM

## 2015-08-16 DIAGNOSIS — M170 Bilateral primary osteoarthritis of knee: Secondary | ICD-10-CM

## 2015-08-16 DIAGNOSIS — G629 Polyneuropathy, unspecified: Secondary | ICD-10-CM

## 2015-08-16 DIAGNOSIS — M150 Primary generalized (osteo)arthritis: Secondary | ICD-10-CM

## 2015-08-16 DIAGNOSIS — M159 Polyosteoarthritis, unspecified: Secondary | ICD-10-CM

## 2015-08-16 DIAGNOSIS — M549 Dorsalgia, unspecified: Secondary | ICD-10-CM

## 2015-08-16 DIAGNOSIS — M47812 Spondylosis without myelopathy or radiculopathy, cervical region: Secondary | ICD-10-CM

## 2015-08-16 NOTE — Telephone Encounter (Signed)
Pt is out of her pain medicine--oxycodone Please call when ready for pickup

## 2015-08-17 MED ORDER — HYDROCODONE-ACETAMINOPHEN 10-325 MG PO TABS: 1.0000 | ORAL_TABLET | Freq: Two times a day (BID) | ORAL | Status: DC | PRN

## 2015-08-17 NOTE — Telephone Encounter (Signed)
LM for patient that script is ready for pick up. Jazmin Hartsell,CMA  

## 2015-08-17 NOTE — Telephone Encounter (Signed)
Please let patient know her prescription(s) are available for pick up from the [facility] front desk.  

## 2015-09-02 DIAGNOSIS — N3020 Other chronic cystitis without hematuria: Secondary | ICD-10-CM | POA: Diagnosis not present

## 2015-09-02 DIAGNOSIS — N3946 Mixed incontinence: Secondary | ICD-10-CM | POA: Diagnosis not present

## 2015-09-08 ENCOUNTER — Ambulatory Visit (INDEPENDENT_AMBULATORY_CARE_PROVIDER_SITE_OTHER): Payer: Medicare Other | Admitting: Family Medicine

## 2015-09-08 ENCOUNTER — Telehealth: Payer: Self-pay | Admitting: Family Medicine

## 2015-09-08 ENCOUNTER — Encounter: Payer: Self-pay | Admitting: Family Medicine

## 2015-09-08 VITALS — BP 134/54 | HR 68 | Temp 97.7°F | Ht 59.0 in | Wt 138.9 lb

## 2015-09-08 DIAGNOSIS — R300 Dysuria: Secondary | ICD-10-CM | POA: Diagnosis not present

## 2015-09-08 LAB — POCT UA - MICROSCOPIC ONLY

## 2015-09-08 LAB — POCT URINALYSIS DIPSTICK
BILIRUBIN UA: NEGATIVE
GLUCOSE UA: NEGATIVE
Ketones, UA: NEGATIVE
NITRITE UA: POSITIVE
PH UA: 6.5
Protein, UA: NEGATIVE
Spec Grav, UA: 1.01
Urobilinogen, UA: 0.2

## 2015-09-08 MED ORDER — CEPHALEXIN 500 MG PO CAPS: 500.0000 mg | ORAL_CAPSULE | Freq: Four times a day (QID) | ORAL | Status: DC

## 2015-09-08 NOTE — Assessment & Plan Note (Signed)
History of recurrent UTIs  Has to self cath due to neurogenic bladder. Is followed by Dr. Gayla Doss and is on impaired trimethoprim She was seen in June 2016 for similar symptoms with a Escherichia coli that was resistant to Cipro, trimethoprim and Bactrim. She had received Keflex to treat that infection. - UA and urine culture done today - I will call with the results

## 2015-09-08 NOTE — Patient Instructions (Signed)
Thank you for coming in,   I will call in antibiotics if they are needed based on your lab results.   Sign up for My Chart to have easy access to your labs results, and communication with your Primary care physician   Please feel free to call with any questions or concerns at any time, at [phone]. --Dr. Raeford Razor

## 2015-09-08 NOTE — Progress Notes (Signed)
   Subjective:    Patient ID: Nicole Perez, female    DOB: 09-26-1933, 81 y.o.   MRN: [id number]  Seen for Same day visit for   CC: dysuria   PMH of neurogenic bladder and multiple UTI's.  She caths herself three times per day She is on Trimethoprim 100 mg daily.  She sees Dr. Bjorn Loser (Urology)  Feels pain when she has to cath herself  Symptoms started on Sunday  Having chills and sweating  This feels to similar times when she has had previous UTI's  Some cough but no chest pain or shortness of breath. No rashes  She always has to take something for her bowel movements. Hx of constipation  No back pain but having having suprapubic pain.    Review of Systems   See HPI for ROS. Objective:  BP 134/54 mmHg  Pulse 68  Temp(Src) 97.7 F (36.5 C) (Oral)  Ht 4\' 11"  (1.499 m)  Wt 138 lb 14.4 oz (63.005 kg)  BMI 28.04 kg/m2  LMP 12/17/1962  General: NAD MSK: no CVA tenderness  Abdomen: soft, nontender, nondistended, no hepatic or splenomegaly. Bowel sounds present, having some suprapubic tenderness  Skin: no rashes around her vulva or groin  Neuro: alert and oriented, no focal deficits     Assessment & Plan:   Dysuria History of recurrent UTIs  Has to self cath due to neurogenic bladder. Is followed by Dr. Gayla Doss and is on impaired trimethoprim She was seen in June 2016 for similar symptoms with a Escherichia coli that was resistant to Cipro, trimethoprim and Bactrim. She had received Keflex to treat that infection. - UA and urine culture done today - I will call with the results

## 2015-09-08 NOTE — Telephone Encounter (Signed)
Spoke with patient and her daughter about results. Her last urine grew bacteria susceptible to keflex. Urine today leukocytes and nitrites. Sent in keflex 500 mg QID for 7 days.   Rosemarie Ax, MD PGY-3, [facility] 09/08/2015, 5:29 PM

## 2015-09-10 LAB — URINE CULTURE: Colony Count: >=100000

## 2015-09-12 ENCOUNTER — Encounter: Payer: Self-pay | Admitting: Family Medicine

## 2015-09-13 ENCOUNTER — Other Ambulatory Visit: Payer: Self-pay | Admitting: Family Medicine

## 2015-09-16 ENCOUNTER — Other Ambulatory Visit: Payer: Self-pay | Admitting: Family Medicine

## 2015-09-16 DIAGNOSIS — M48061 Spinal stenosis, lumbar region without neurogenic claudication: Secondary | ICD-10-CM

## 2015-09-16 DIAGNOSIS — M4804 Spinal stenosis, thoracic region: Secondary | ICD-10-CM

## 2015-09-16 DIAGNOSIS — M170 Bilateral primary osteoarthritis of knee: Secondary | ICD-10-CM

## 2015-09-16 DIAGNOSIS — M549 Dorsalgia, unspecified: Secondary | ICD-10-CM

## 2015-09-16 DIAGNOSIS — G629 Polyneuropathy, unspecified: Secondary | ICD-10-CM

## 2015-09-16 DIAGNOSIS — G8929 Other chronic pain: Secondary | ICD-10-CM

## 2015-09-16 DIAGNOSIS — M159 Polyosteoarthritis, unspecified: Secondary | ICD-10-CM

## 2015-09-16 DIAGNOSIS — G608 Other hereditary and idiopathic neuropathies: Secondary | ICD-10-CM

## 2015-09-16 DIAGNOSIS — M150 Primary generalized (osteo)arthritis: Secondary | ICD-10-CM

## 2015-09-16 DIAGNOSIS — R339 Retention of urine, unspecified: Secondary | ICD-10-CM | POA: Diagnosis not present

## 2015-09-16 DIAGNOSIS — M160 Bilateral primary osteoarthritis of hip: Secondary | ICD-10-CM

## 2015-09-16 DIAGNOSIS — M47812 Spondylosis without myelopathy or radiculopathy, cervical region: Secondary | ICD-10-CM

## 2015-09-16 MED ORDER — HYDROCODONE-ACETAMINOPHEN 10-325 MG PO TABS: 1.0000 | ORAL_TABLET | Freq: Two times a day (BID) | ORAL | Status: DC | PRN

## 2015-09-16 NOTE — Telephone Encounter (Signed)
Need refill on her hydrocodone.  Patient out of medication.  Daughter will pick up when ready.

## 2015-09-16 NOTE — Telephone Encounter (Signed)
Done

## 2015-10-04 ENCOUNTER — Telehealth: Payer: Self-pay | Admitting: Family Medicine

## 2015-10-04 NOTE — Telephone Encounter (Signed)
Spoke with daughter Thornell Mule "Patient is having a hard time.  She has been up all night and has decreased balance.  Her pain is a lot worse in the morning and has frequent urination all night."   She would like to patient to be seen in clinic or be referred to someone for her bladder.  Tried to call patient and see if she can come in this week to see a different provider to be evaluated.  LM for her to call back.  Will forward to Dr. McDiarmid so he is aware of the situation. Deanne Bedgood,CMA

## 2015-10-04 NOTE — Telephone Encounter (Signed)
[id number] Hurting in morning Going to BR a lot during night, not getting any sleep Took an abx Saturday Been in bed except getting up to pee a lot  Recommend Ms Sabra Heck get her mother in tomorrow at 1:30 so I can assess her.   [facility] team: * Please schedule patient as SDA tomorrow at 1:30 pm to see Nicole Perez.  Page Johnnay Pleitez when patient in exam room. Chief complaint urinary frequency.

## 2015-10-04 NOTE — Telephone Encounter (Signed)
Daughter is calling and would like to speak to Dr. McDiarmid as soon as possible about her mother. Nicole Perez

## 2015-10-04 NOTE — Telephone Encounter (Signed)
Will forward note to [employer] to schedule.  Patient is aware of time change. Jazmin Hartsell,CMA

## 2015-10-05 ENCOUNTER — Ambulatory Visit (INDEPENDENT_AMBULATORY_CARE_PROVIDER_SITE_OTHER): Payer: Medicare Other | Admitting: Family Medicine

## 2015-10-05 ENCOUNTER — Encounter: Payer: Self-pay | Admitting: Family Medicine

## 2015-10-05 ENCOUNTER — Ambulatory Visit: Payer: Medicare Other | Admitting: Family Medicine

## 2015-10-05 VITALS — BP 117/67 | HR 55 | Temp 97.6°F | Ht 59.0 in | Wt 138.8 lb

## 2015-10-05 DIAGNOSIS — M48061 Spinal stenosis, lumbar region without neurogenic claudication: Secondary | ICD-10-CM

## 2015-10-05 DIAGNOSIS — G8929 Other chronic pain: Secondary | ICD-10-CM

## 2015-10-05 DIAGNOSIS — G608 Other hereditary and idiopathic neuropathies: Secondary | ICD-10-CM

## 2015-10-05 DIAGNOSIS — M4804 Spinal stenosis, thoracic region: Secondary | ICD-10-CM | POA: Diagnosis not present

## 2015-10-05 DIAGNOSIS — M150 Primary generalized (osteo)arthritis: Secondary | ICD-10-CM

## 2015-10-05 DIAGNOSIS — M4806 Spinal stenosis, lumbar region: Secondary | ICD-10-CM

## 2015-10-05 DIAGNOSIS — R350 Frequency of micturition: Secondary | ICD-10-CM

## 2015-10-05 DIAGNOSIS — M170 Bilateral primary osteoarthritis of knee: Secondary | ICD-10-CM | POA: Diagnosis not present

## 2015-10-05 DIAGNOSIS — G629 Polyneuropathy, unspecified: Secondary | ICD-10-CM

## 2015-10-05 DIAGNOSIS — M160 Bilateral primary osteoarthritis of hip: Secondary | ICD-10-CM | POA: Diagnosis not present

## 2015-10-05 DIAGNOSIS — R002 Palpitations: Secondary | ICD-10-CM

## 2015-10-05 DIAGNOSIS — R0600 Dyspnea, unspecified: Secondary | ICD-10-CM

## 2015-10-05 DIAGNOSIS — M549 Dorsalgia, unspecified: Secondary | ICD-10-CM

## 2015-10-05 DIAGNOSIS — M47812 Spondylosis without myelopathy or radiculopathy, cervical region: Secondary | ICD-10-CM

## 2015-10-05 DIAGNOSIS — M159 Polyosteoarthritis, unspecified: Secondary | ICD-10-CM

## 2015-10-05 LAB — POCT URINALYSIS DIP (MANUAL ENTRY)
BILIRUBIN UA: NEGATIVE
BILIRUBIN UA: NEGATIVE
GLUCOSE UA: NEGATIVE
Nitrite, UA: POSITIVE — AB
Protein Ur, POC: NEGATIVE
SPEC GRAV UA: 1.01
Urobilinogen, UA: 0.2
pH, UA: 5.5

## 2015-10-05 LAB — CBC
HCT: 33.4 % — ABNORMAL LOW (ref 36.0–46.0)
Hemoglobin: 11.4 g/dL — ABNORMAL LOW (ref 12.0–15.0)
MCH: 28.5 pg (ref 26.0–34.0)
MCHC: 34.1 g/dL (ref 30.0–36.0)
MCV: 83.5 fL (ref 78.0–100.0)
MPV: 10.1 fL (ref 8.6–12.4)
PLATELETS: 263 10*3/uL (ref 150–400)
RBC: 4 MIL/uL (ref 3.87–5.11)
RDW: 14 % (ref 11.5–15.5)
WBC: 4.9 10*3/uL (ref 4.0–10.5)

## 2015-10-05 LAB — POCT UA - MICROSCOPIC ONLY: WBC, Ur, HPF, POC: >20

## 2015-10-05 MED ORDER — CEPHALEXIN 500 MG PO CAPS: 500.0000 mg | ORAL_CAPSULE | Freq: Four times a day (QID) | ORAL | Status: DC

## 2015-10-05 MED ORDER — HYDROCODONE-ACETAMINOPHEN 10-325 MG PO TABS: 1.0000 | ORAL_TABLET | Freq: Two times a day (BID) | ORAL | Status: DC | PRN

## 2015-10-05 MED ORDER — HYDROCHLOROTHIAZIDE 12.5 MG PO CAPS: 12.5000 mg | ORAL_CAPSULE | Freq: Every day | ORAL | Status: DC

## 2015-10-05 NOTE — Progress Notes (Signed)
Did and in and out cath on pt. She void 256 mLs and she tolerated the procedure well. Destynee Stringfellow Kennon Holter, CMA

## 2015-10-05 NOTE — Progress Notes (Signed)
   Subjective:    Patient ID: CLOIS MONTAVON, female    DOB: 12/03/1933, 81 y.o.   MRN: [id number]  Patient accompanied by her dgt, Ms Thornell Mule.  Both Ms Goertzen and Ms Sabra Heck were sources of information for the visit.  HPI  Urinary Frequency - Onset several months ago - has been taking trimethoprim 100 mg daily per Dr Nicki Reaper MacDiarmid (Urol) - urinary urgency making it difficult to sleep at night, leading to daytime fatigue. - She has a large volume urine leak when she gets strong urgency sensation.  - Often has cramping sensation - No fever/chills, no new back or flank pain, no dysuria - Constant pattern - Progression in frequency over last couple weeks. - Context: patient performs clean self catheterizations of bladder three times a day.      - PMH: idiopathic neurogenic bladder for last two years.  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   No smoking.   New drug intolerance added to list: nitrofurantoin causing malaise and fatigue.   Medication list was updated from patient's bottles she brought in  Review of Systems  Palpitations with exertion (+) nausea when takes nitrofurantoin prescribed by Dr Matilde Sprang No constipation or diarrhea.      Objective:   Physical Exam VS reviewed GEN: Alert, Cooperative, Groomed, NAD COR: HR 60 regular with occasional PB, No M/G/R, No JVD LUNGS: BCTA, No Acc mm use, speaking in full sentences ABDOMEN: (+)BS, soft, NT, ND, no palpable bladder dome in midline GU: Normal Rectal tone, no palpable masses, prostate without hypertrophy/asymmetry/nodularity. Hemoccult negative. EXT: No peripheral leg edema. Feet without deformity or lesions. Palpable bilateral pedal pulses.  Neuro: Alert and oriented, slow gait with full swing phase without swing across midline.  Psych: Normal affect/thought/speech/language        Assessment & Plan:  1. Possible Intermittent Catheter-associated UTI - New, recurrent  problem. No further work-up planned.  -   urgency + frequency in patient that cath's herself - On  - Possible UTI - Rx keflex 500 mg QID x 7 days - Urine sent for culture  2. Neurogenic Bladder and Detrusor instability - Established problem with worsening - Spent 30 minutes in visit with over 15 minutes in consultation about treatement options for detrusor instability, including B3 blockers, anticholinergic, Detrusor Botox injections, permanent catheter.  Pt declined any pharmacologic or invasive procedure. - Urinalysis obtained to look for DMT2 or infection leading to increased bladder urgency and frequency.  3. Palpitaitons - New problem - Suspect it is related to deconditioning.  - Will check CBC, CMET and TSH to look for secondary causes.  - Advised patient to contact Dr Shamra Bradeen if palpitations occur at rest.

## 2015-10-05 NOTE — Patient Instructions (Addendum)
We are checking your kidney, liver, electrolytes, for anemia, and for bladder infection.  Refilled your HCTZ Stopping the Nitrofurantoin.   Consider taking the Gabapentin (capsule) when your legs or feet are bothering you.  You can take one capsule every 8 hours if you need to for the pain and discomfort.

## 2015-10-05 NOTE — Addendum Note (Signed)
Addended by: Maryland Pink on: 10/05/2015 03:55 PM   Modules accepted: Orders

## 2015-10-06 LAB — COMPREHENSIVE METABOLIC PANEL
ALT: 10 U/L (ref 6–29)
AST: 14 U/L (ref 10–35)
Albumin: 3.9 g/dL (ref 3.6–5.1)
Alkaline Phosphatase: 52 U/L (ref 33–130)
BUN: 25 mg/dL (ref 7–25)
CHLORIDE: 102 mmol/L (ref 98–110)
CO2: 24 mmol/L (ref 20–31)
CREATININE: 1.14 mg/dL — AB (ref 0.60–0.88)
Calcium: 9.3 mg/dL (ref 8.6–10.4)
Glucose, Bld: 84 mg/dL (ref 65–99)
POTASSIUM: 4.4 mmol/L (ref 3.5–5.3)
SODIUM: 135 mmol/L (ref 135–146)
TOTAL PROTEIN: 6.6 g/dL (ref 6.1–8.1)
Total Bilirubin: 0.3 mg/dL (ref 0.2–1.2)

## 2015-10-06 LAB — TSH: TSH: 2.339 u[IU]/mL (ref 0.350–4.500)

## 2015-10-07 LAB — URINE CULTURE: Colony Count: >=100000

## 2015-10-25 DIAGNOSIS — R339 Retention of urine, unspecified: Secondary | ICD-10-CM | POA: Diagnosis not present

## 2015-11-15 ENCOUNTER — Other Ambulatory Visit: Payer: Self-pay | Admitting: Family Medicine

## 2015-11-15 DIAGNOSIS — M549 Dorsalgia, unspecified: Secondary | ICD-10-CM

## 2015-11-15 DIAGNOSIS — M170 Bilateral primary osteoarthritis of knee: Secondary | ICD-10-CM

## 2015-11-15 DIAGNOSIS — G608 Other hereditary and idiopathic neuropathies: Secondary | ICD-10-CM

## 2015-11-15 DIAGNOSIS — G629 Polyneuropathy, unspecified: Secondary | ICD-10-CM

## 2015-11-15 DIAGNOSIS — M4804 Spinal stenosis, thoracic region: Secondary | ICD-10-CM

## 2015-11-15 DIAGNOSIS — M150 Primary generalized (osteo)arthritis: Secondary | ICD-10-CM

## 2015-11-15 DIAGNOSIS — G8929 Other chronic pain: Secondary | ICD-10-CM

## 2015-11-15 DIAGNOSIS — M159 Polyosteoarthritis, unspecified: Secondary | ICD-10-CM

## 2015-11-15 DIAGNOSIS — M48061 Spinal stenosis, lumbar region without neurogenic claudication: Secondary | ICD-10-CM

## 2015-11-15 DIAGNOSIS — M47812 Spondylosis without myelopathy or radiculopathy, cervical region: Secondary | ICD-10-CM

## 2015-11-15 DIAGNOSIS — M160 Bilateral primary osteoarthritis of hip: Secondary | ICD-10-CM

## 2015-11-15 NOTE — Telephone Encounter (Signed)
Needs refill on hydrocondone

## 2015-11-16 ENCOUNTER — Telehealth: Payer: Self-pay | Admitting: *Deleted

## 2015-11-16 MED ORDER — HYDROCODONE-ACETAMINOPHEN 10-325 MG PO TABS: 1.0000 | ORAL_TABLET | Freq: Two times a day (BID) | ORAL | Status: DC | PRN

## 2015-11-16 NOTE — Telephone Encounter (Signed)
Spoke with patient and she is aware of this.  States that her daughter Asa Lente will be here in clinic tomorrow and will pick it up then.  She did complain of blood coming out of her urethra this morning.  She did a self cath at home around 3am to release "her water and when I pulled the cath out it spewed with blood".  Patient states that there wasn't any blood in her urine.  Also it did this later in the morning after taking out the catheter.  She would like to know if this means she has "blood in her bladder" and also does she need an appt.  I informed patient that I could get her an appt tomorrow around the same time as her daughter's but it would be with a different provider.  She wants me to check with pcp first.  Also she is complaining of low back pain.  Will forward to MD to advise. Chennel Olivos,CMA

## 2015-11-16 NOTE — Telephone Encounter (Signed)
-----   Message from Blane Ohara McDiarmid, MD sent at 11/16/2015 11:36 AM EST ----- Regarding: Pick up Rx Please let patient know her prescription(s) are available for pick up from the [facility] front desk. Norco 10-325 tablets

## 2015-11-16 NOTE — Telephone Encounter (Signed)
May double book patient during her daughter's appointment.

## 2015-11-17 ENCOUNTER — Ambulatory Visit (INDEPENDENT_AMBULATORY_CARE_PROVIDER_SITE_OTHER): Payer: Medicare Other | Admitting: Family Medicine

## 2015-11-17 ENCOUNTER — Encounter: Payer: Self-pay | Admitting: Family Medicine

## 2015-11-17 VITALS — BP 112/59 | HR 60 | Temp 97.8°F | Wt 136.1 lb

## 2015-11-17 DIAGNOSIS — N362 Urethral caruncle: Secondary | ICD-10-CM

## 2015-11-17 DIAGNOSIS — N319 Neuromuscular dysfunction of bladder, unspecified: Secondary | ICD-10-CM | POA: Diagnosis not present

## 2015-11-17 DIAGNOSIS — Z23 Encounter for immunization: Secondary | ICD-10-CM

## 2015-11-17 DIAGNOSIS — N318 Other neuromuscular dysfunction of bladder: Secondary | ICD-10-CM | POA: Diagnosis not present

## 2015-11-17 DIAGNOSIS — R339 Retention of urine, unspecified: Secondary | ICD-10-CM

## 2015-11-17 DIAGNOSIS — R310 Gross hematuria: Secondary | ICD-10-CM

## 2015-11-17 HISTORY — DX: Gross hematuria: R31.0

## 2015-11-17 HISTORY — DX: Urethral caruncle: N36.2

## 2015-11-17 LAB — POCT URINALYSIS DIPSTICK
Bilirubin, UA: NEGATIVE
Blood, UA: NEGATIVE
Glucose, UA: NEGATIVE
Ketones, UA: NEGATIVE
LEUKOCYTES UA: NEGATIVE
NITRITE UA: NEGATIVE
PROTEIN UA: NEGATIVE
UROBILINOGEN UA: 0.2
pH, UA: 5.5

## 2015-11-17 NOTE — Assessment & Plan Note (Addendum)
New problem with further workup Urinalysis without evidence of infection nor blood  CT abdomin in January 2016 showed only bilateral renal cysts.  Suspect the report of bleeding was secondary to trauma to urethral polyp Monitor for now.  Repeat urine in month. If hematuria present will plan workup of upper urinary tract with referral back to [facility] for cystoscopy

## 2015-11-17 NOTE — Assessment & Plan Note (Signed)
New Finding No further work up at this time unless patient continues to have difficulty with self-catherization.  Suspect polyp is inflammatory response to mechanical irritation for patient's self-catheterizations

## 2015-11-17 NOTE — Progress Notes (Signed)
   Subjective:    Patient ID: Nicole Perez, female    DOB: 08/26/1933, 81 y.o.   MRN: [id number] TENEILLE KOLAKOWSKI is accompanied by daughter and niece Sources of clinical information for visit is/are patient and her daughter and EMR.  HPI  Hematuria (Gross) acute - Duration: onset yesterday early moring. Awoken from sleep with sense of swollen lower abdomin.  Self-cath resulted in over 1000 ml of urine output with bright red blood bleeding with withdrawal of catheter.  - Attempted self-cath in afternoon yesterday and was unable to pass catheter through the urethra.  When cath tip examined, it had blood on it.  - Context: Idiopathic neurogenic bladder for which patient self-caths three times a day.  - Denies personal history of blood in urine.  Denies hx of being unable to catheterize self.    Patient has been successfully self-cathing bladder since 11/2013.  - Associated Symptoms: (+) Urgency without voiding,  No fever, no nausea/vomiting, no constipation nor diarrhea.  - CT abdomin in January 2016 showed only bilateral renal cysts. No smoking Lives Alone Review of Systems See HPI    Objective:   Physical Exam VS reviewed GEN: Alert, Cooperative, Groomed, NAD COR: bradycardia with reg rythy,  LUNGS: BCTA, No Acc mm use, speaking in full sentences ABDOMEN: (+)BS, soft, ND, (+) suprapubic tenderness, no palpable bladder dome. ,  GU:  Ext genital: 2 -3 mm smooth surface, flesh-colored, pedunculated polyp originating From external urethral meatus.  No blood on vulva nor introitus.   Sterile prep of IN & Out bladder catheterization:  Female cath passed easily through [facility] into bladder with 500 ml of clear urine obtained EXT: No peripheral leg edema.  SKIN: No lesion nor rashes of face/trunk/extremities Psych: Normal affect/thought/speech/language    Assessment & Plan:

## 2015-11-17 NOTE — Assessment & Plan Note (Addendum)
Established problem worsened.  Patient having difficulty passiing home bladder catheter Possible obstruction from urethral polyp may have made intubating urethra difficult in patient with limited mobility and eye sight.  I&O urine specimen without evidence of infection.    Will ask Home Health Nurses to check on patient next three days to see that she is able to catheterize herself.  If she is unable to catheterize herself, order put in for [facility] nurse to place indwelling foley cath and notify our practice so we can arrange Urologic follow with her urologists at [facility].

## 2015-11-17 NOTE — Patient Instructions (Signed)
There is a small polyp at the opening of your urethra that may have bleed.   The polyp may have blocked your catheter from going through your urethra.  We will check your urine for infection.   If it grows a bacteria on urine culture, Dr McDiarmid will call in antibiotics for you to treat a UTI.  We will arrange for a nurse from [facility] to come out to your home for the next few days to be a back in case you are unable to catheterize yourself.  They will then catheterize you.  If you continue to need help with catheterization, then we will ask the the home health nurse to place an indwelling foley catheter, then contact our office to let us know.  We will then set up an appointment with her urologists at [facility] to see her within the next 4 to 7 days.

## 2015-11-18 DIAGNOSIS — Z466 Encounter for fitting and adjustment of urinary device: Secondary | ICD-10-CM | POA: Diagnosis not present

## 2015-11-18 DIAGNOSIS — I10 Essential (primary) hypertension: Secondary | ICD-10-CM | POA: Diagnosis not present

## 2015-11-18 DIAGNOSIS — M4805 Spinal stenosis, thoracolumbar region: Secondary | ICD-10-CM | POA: Diagnosis not present

## 2015-11-18 DIAGNOSIS — R339 Retention of urine, unspecified: Secondary | ICD-10-CM | POA: Diagnosis not present

## 2015-11-18 LAB — URINE CULTURE
Colony Count: NO GROWTH
ORGANISM ID, BACTERIA: NO GROWTH

## 2015-11-21 ENCOUNTER — Telehealth: Payer: Self-pay | Admitting: *Deleted

## 2015-11-21 ENCOUNTER — Other Ambulatory Visit: Payer: Self-pay | Admitting: Family Medicine

## 2015-11-21 NOTE — Telephone Encounter (Signed)
Nicole Perez also requesting verbal orders for PT for patient for the next 2-3 weeks. Can be reached at [phone].

## 2015-11-21 NOTE — Telephone Encounter (Signed)
Will forward to MD. Crispin Vogel,CMA  

## 2015-11-21 NOTE — Telephone Encounter (Signed)
Nicole Perez with [facility] calling to inform PCP that they will be going out to see patient 1-2x/week to assist with self catherization and provide patient education. FYI to PCP

## 2015-11-22 ENCOUNTER — Telehealth: Payer: Self-pay | Admitting: Family Medicine

## 2015-11-22 DIAGNOSIS — I10 Essential (primary) hypertension: Secondary | ICD-10-CM | POA: Diagnosis not present

## 2015-11-22 DIAGNOSIS — R339 Retention of urine, unspecified: Secondary | ICD-10-CM | POA: Diagnosis not present

## 2015-11-22 DIAGNOSIS — Z466 Encounter for fitting and adjustment of urinary device: Secondary | ICD-10-CM | POA: Diagnosis not present

## 2015-11-22 DIAGNOSIS — M4805 Spinal stenosis, thoracolumbar region: Secondary | ICD-10-CM | POA: Diagnosis not present

## 2015-11-22 NOTE — Telephone Encounter (Signed)
Is still having trouble--her " water" smells and when catherize herself-it stings and when she pees, it stings,. She never heard anything from the test last week Please advise

## 2015-11-22 NOTE — Telephone Encounter (Signed)
Will forward to MD. Rece Zechman,CMA  

## 2015-11-23 NOTE — Telephone Encounter (Signed)
-----   Message from [place], MD sent at 11/23/2015 11:49 AM EST ----- Regarding: Urine Culture Results Please let Ms Plescia know that the urine culture from last Thursday did not grow any germs.   She did not have a UTI.  No antibiotic therapy is needed.  ----- Message -----    From: Maryland Pink, CMA    Sent: 11/17/2015  10:39 AM      To: Blane Ohara McDiarmid, MD

## 2015-11-23 NOTE — Telephone Encounter (Signed)
Spoke with patient and she is aware of results.  She was thankful there is no infection and voiced understanding on not needing antibiotics. Luanne Krzyzanowski,CMA

## 2015-11-23 NOTE — Telephone Encounter (Signed)
Please call in verbal authorization To [facility] for Physical Therapy for next 2 to 3 weeks, 1 to 2 times a week for self catherization assistance and patient education.

## 2015-11-23 NOTE — Telephone Encounter (Signed)
Nicole Perez Nurses:  Called about verbal authorization for PT.  Also needs permission from dr mcdiarmid to allow pt urologist to be able to give orders also

## 2015-11-23 NOTE — Telephone Encounter (Signed)
Will forward to MD. Hurley Sobel,CMA  

## 2015-11-24 ENCOUNTER — Telehealth: Payer: Self-pay | Admitting: *Deleted

## 2015-11-24 DIAGNOSIS — Z466 Encounter for fitting and adjustment of urinary device: Secondary | ICD-10-CM | POA: Diagnosis not present

## 2015-11-24 DIAGNOSIS — M4805 Spinal stenosis, thoracolumbar region: Secondary | ICD-10-CM | POA: Diagnosis not present

## 2015-11-24 DIAGNOSIS — R339 Retention of urine, unspecified: Secondary | ICD-10-CM | POA: Diagnosis not present

## 2015-11-24 DIAGNOSIS — I10 Essential (primary) hypertension: Secondary | ICD-10-CM | POA: Diagnosis not present

## 2015-11-24 NOTE — Telephone Encounter (Signed)
Please give order to [facility] for a Medical Social Worker to evaluate patient's community resources.   [place] nurse may advise patient to use Miralax for constipation.  If Miralax is insufficient, patient may take Senokot 2 tablets daily as needed for constipation in addition to the Miralax

## 2015-11-24 NOTE — Telephone Encounter (Signed)
Please give [facility] Nurse authorization order for Ms Rayner's Urologist to issue orders for Ms Bendon' medical care.  I believe her urologist is Dr Nicki Reaper MacDiarmid.

## 2015-11-24 NOTE — Addendum Note (Signed)
Addended byLissa Morales D on: 11/24/2015 04:29 PM   Modules accepted: Orders, Medications

## 2015-11-24 NOTE — Telephone Encounter (Signed)
Received message from Nurse, [name] with [facility] [phone]) requesting order for Medical Social Worker evaluation for community resources. Also, patient has history of constipation with relief from [place]. Patient's last BM was 4 days ago but hasn't taken Milk of Mag recently. Nurse has discussed with patient increasing fiber and fluids but would like okay to advise patient to start miralax and/or colace. Please advise. Velora Heckler, RN

## 2015-11-25 NOTE — Telephone Encounter (Signed)
Spoke with heather at [facility] and verbal orders given. Jazmin Hartsell,CMA

## 2015-11-28 DIAGNOSIS — M4805 Spinal stenosis, thoracolumbar region: Secondary | ICD-10-CM | POA: Diagnosis not present

## 2015-11-28 DIAGNOSIS — I10 Essential (primary) hypertension: Secondary | ICD-10-CM | POA: Diagnosis not present

## 2015-11-28 DIAGNOSIS — R339 Retention of urine, unspecified: Secondary | ICD-10-CM | POA: Diagnosis not present

## 2015-11-28 DIAGNOSIS — Z466 Encounter for fitting and adjustment of urinary device: Secondary | ICD-10-CM | POA: Diagnosis not present

## 2015-11-29 DIAGNOSIS — Z466 Encounter for fitting and adjustment of urinary device: Secondary | ICD-10-CM | POA: Diagnosis not present

## 2015-11-29 DIAGNOSIS — R339 Retention of urine, unspecified: Secondary | ICD-10-CM | POA: Diagnosis not present

## 2015-11-29 DIAGNOSIS — I10 Essential (primary) hypertension: Secondary | ICD-10-CM | POA: Diagnosis not present

## 2015-11-29 DIAGNOSIS — M4805 Spinal stenosis, thoracolumbar region: Secondary | ICD-10-CM | POA: Diagnosis not present

## 2015-11-30 ENCOUNTER — Telehealth: Payer: Self-pay | Admitting: Family Medicine

## 2015-11-30 NOTE — Telephone Encounter (Signed)
Will forward to MD. Jazmin Hartsell,CMA  

## 2015-11-30 NOTE — Telephone Encounter (Signed)
Spoke with Nicole Perez and he is aware of verbal order. Emri Sample,CMA

## 2015-11-30 NOTE — Telephone Encounter (Signed)
Please call in authorization order to [place], Andreas Newport.  1) Home Health visits for 9 visits over next 6 weeks     2 x wk for wks 1-2     1 x wk for wks 3-4     2 x wk for wk 5     1 x wk for wk 6

## 2015-11-30 NOTE — Telephone Encounter (Signed)
Andreas Newport, a [place] is calling to retrieve verbal orders to continue home health for the pt for 9 visits over the course of the next 6 weeks.    2x a week for weeks 1-2  1x for weeks 3-4,  2x for week 5, 1x for week 6.  Please contact Sreejesh at the earliest convenience for these orders. Thank you, Fonda Kinder, ASA

## 2015-11-30 NOTE — Telephone Encounter (Signed)
Wants to request a Education officer, museum also

## 2015-12-01 DIAGNOSIS — M4805 Spinal stenosis, thoracolumbar region: Secondary | ICD-10-CM | POA: Diagnosis not present

## 2015-12-01 DIAGNOSIS — I10 Essential (primary) hypertension: Secondary | ICD-10-CM | POA: Diagnosis not present

## 2015-12-01 DIAGNOSIS — Z466 Encounter for fitting and adjustment of urinary device: Secondary | ICD-10-CM | POA: Diagnosis not present

## 2015-12-01 DIAGNOSIS — R339 Retention of urine, unspecified: Secondary | ICD-10-CM | POA: Diagnosis not present

## 2015-12-02 ENCOUNTER — Telehealth: Payer: Self-pay | Admitting: Family Medicine

## 2015-12-02 DIAGNOSIS — R339 Retention of urine, unspecified: Secondary | ICD-10-CM | POA: Diagnosis not present

## 2015-12-02 DIAGNOSIS — I10 Essential (primary) hypertension: Secondary | ICD-10-CM | POA: Diagnosis not present

## 2015-12-02 DIAGNOSIS — M4805 Spinal stenosis, thoracolumbar region: Secondary | ICD-10-CM | POA: Diagnosis not present

## 2015-12-02 DIAGNOSIS — Z466 Encounter for fitting and adjustment of urinary device: Secondary | ICD-10-CM | POA: Diagnosis not present

## 2015-12-02 NOTE — Telephone Encounter (Signed)
Recommend patient hold Diltiazem for fourdays, then restart at same dose and schedule. Let us know if heart rate does not improve with holding Diltiazem and what her BP is on and off Diltiazem

## 2015-12-02 NOTE — Telephone Encounter (Signed)
Heather from [place] called and would like to speak to a nurse about the patient heart rate. It is low today at 46-48. Her heart rate is usually in the high 70's. jw

## 2015-12-02 NOTE — Telephone Encounter (Signed)
Heather informed, she will contact patient.

## 2015-12-02 NOTE — Telephone Encounter (Signed)
Nicole Perez would like to know if there are any medication MD would like patient to hold.

## 2015-12-05 DIAGNOSIS — I10 Essential (primary) hypertension: Secondary | ICD-10-CM | POA: Diagnosis not present

## 2015-12-05 DIAGNOSIS — M4805 Spinal stenosis, thoracolumbar region: Secondary | ICD-10-CM | POA: Diagnosis not present

## 2015-12-05 DIAGNOSIS — Z466 Encounter for fitting and adjustment of urinary device: Secondary | ICD-10-CM | POA: Diagnosis not present

## 2015-12-05 DIAGNOSIS — R339 Retention of urine, unspecified: Secondary | ICD-10-CM | POA: Diagnosis not present

## 2015-12-07 DIAGNOSIS — I10 Essential (primary) hypertension: Secondary | ICD-10-CM | POA: Diagnosis not present

## 2015-12-07 DIAGNOSIS — Z466 Encounter for fitting and adjustment of urinary device: Secondary | ICD-10-CM | POA: Diagnosis not present

## 2015-12-07 DIAGNOSIS — M4805 Spinal stenosis, thoracolumbar region: Secondary | ICD-10-CM | POA: Diagnosis not present

## 2015-12-07 DIAGNOSIS — R339 Retention of urine, unspecified: Secondary | ICD-10-CM | POA: Diagnosis not present

## 2015-12-07 NOTE — Telephone Encounter (Signed)
Hydrocodone together before bed.  I asked her to offer patient a same day appt but that it would be with a different provider than her pcp.  Also told her that she should call us on Friday before holiday closing to inform us of her heart rate and bp without the medication.  [name] will also inform patient that the on call MD can be paged after hours if she needs them.  Safal Halderman,CMA

## 2015-12-07 NOTE — Telephone Encounter (Signed)
Heather with [name] called back and states that patient never listened to her voicemail from last week and didn't stop her medication.  She has been taking her diltiazem like normal and her heart rate has still been in the 40's.  She was wondering if patient can go ahead and stop it for 4 days to see if it comes back up to normal.  I informed her that it would be.  Per provider she needed to stop this and restart after 4 days.  Heather voiced understanding and will relay message to patient.  She also mentioned that patient had complained of some dizziness this morning when she woke up and the only thing she had taken differently was her gabapentin and

## 2015-12-08 DIAGNOSIS — M4805 Spinal stenosis, thoracolumbar region: Secondary | ICD-10-CM | POA: Diagnosis not present

## 2015-12-08 DIAGNOSIS — Z466 Encounter for fitting and adjustment of urinary device: Secondary | ICD-10-CM | POA: Diagnosis not present

## 2015-12-08 DIAGNOSIS — R339 Retention of urine, unspecified: Secondary | ICD-10-CM | POA: Diagnosis not present

## 2015-12-08 DIAGNOSIS — I10 Essential (primary) hypertension: Secondary | ICD-10-CM | POA: Diagnosis not present

## 2015-12-09 ENCOUNTER — Other Ambulatory Visit: Payer: Self-pay

## 2015-12-09 DIAGNOSIS — Z1231 Encounter for screening mammogram for malignant neoplasm of breast: Secondary | ICD-10-CM

## 2015-12-13 ENCOUNTER — Telehealth: Payer: Self-pay | Admitting: Family Medicine

## 2015-12-13 DIAGNOSIS — R339 Retention of urine, unspecified: Secondary | ICD-10-CM | POA: Diagnosis not present

## 2015-12-13 DIAGNOSIS — M170 Bilateral primary osteoarthritis of knee: Secondary | ICD-10-CM

## 2015-12-13 DIAGNOSIS — G608 Other hereditary and idiopathic neuropathies: Secondary | ICD-10-CM

## 2015-12-13 DIAGNOSIS — M4804 Spinal stenosis, thoracic region: Secondary | ICD-10-CM

## 2015-12-13 DIAGNOSIS — I10 Essential (primary) hypertension: Secondary | ICD-10-CM | POA: Diagnosis not present

## 2015-12-13 DIAGNOSIS — M4805 Spinal stenosis, thoracolumbar region: Secondary | ICD-10-CM | POA: Diagnosis not present

## 2015-12-13 DIAGNOSIS — M47812 Spondylosis without myelopathy or radiculopathy, cervical region: Secondary | ICD-10-CM

## 2015-12-13 DIAGNOSIS — Z466 Encounter for fitting and adjustment of urinary device: Secondary | ICD-10-CM | POA: Diagnosis not present

## 2015-12-13 DIAGNOSIS — M150 Primary generalized (osteo)arthritis: Secondary | ICD-10-CM

## 2015-12-13 DIAGNOSIS — M160 Bilateral primary osteoarthritis of hip: Secondary | ICD-10-CM

## 2015-12-13 DIAGNOSIS — M48061 Spinal stenosis, lumbar region without neurogenic claudication: Secondary | ICD-10-CM

## 2015-12-13 DIAGNOSIS — G8929 Other chronic pain: Secondary | ICD-10-CM

## 2015-12-13 DIAGNOSIS — M549 Dorsalgia, unspecified: Secondary | ICD-10-CM

## 2015-12-13 DIAGNOSIS — G629 Polyneuropathy, unspecified: Secondary | ICD-10-CM

## 2015-12-13 DIAGNOSIS — M159 Polyosteoarthritis, unspecified: Secondary | ICD-10-CM

## 2015-12-13 NOTE — Telephone Encounter (Signed)
Patient called and needs a refill on her Hydrocodone left up front by Thursday. She is do for a refill on 12/18/15 which is Sunday. We do close early on Friday. jw

## 2015-12-15 ENCOUNTER — Telehealth: Payer: Self-pay | Admitting: *Deleted

## 2015-12-15 DIAGNOSIS — Z466 Encounter for fitting and adjustment of urinary device: Secondary | ICD-10-CM | POA: Diagnosis not present

## 2015-12-15 DIAGNOSIS — I10 Essential (primary) hypertension: Secondary | ICD-10-CM | POA: Diagnosis not present

## 2015-12-15 DIAGNOSIS — R339 Retention of urine, unspecified: Secondary | ICD-10-CM | POA: Diagnosis not present

## 2015-12-15 DIAGNOSIS — M4805 Spinal stenosis, thoracolumbar region: Secondary | ICD-10-CM | POA: Diagnosis not present

## 2015-12-15 MED ORDER — HYDROCODONE-ACETAMINOPHEN 10-325 MG PO TABS: 1.0000 | ORAL_TABLET | Freq: Two times a day (BID) | ORAL | Status: DC | PRN

## 2015-12-15 NOTE — Telephone Encounter (Signed)
RX for Norco 10/325 tab, #60, RF-zero written  Please let patient know her prescription(s) are available for pick up from the [facility] front desk.

## 2015-12-15 NOTE — Telephone Encounter (Signed)
Spoke with patient and she is aware that script is ready for pick up.  Will have one of her children come by and pick up. Jazmin Hartsell,CMA

## 2015-12-15 NOTE — Telephone Encounter (Signed)
[name], RN with [facility] left voice message on nurse line requesting a verbal order for one more home visit next week to follow up with patient's blood pressure.  Completed home visit today, blood pressure 159/80 heart rate 72 with regular rhythm.  Patient also complained of increase pain and requested a Rx for pain patch for right knee.  Patient should have had a refill for pian medication today.  Also patient was suppose to stop taking diltiazem last week for four days and then restart the medication back.  Patient did not start medication until today during home visit.  Please give [name] a call at [phone].  Derl Barrow, RN

## 2015-12-16 NOTE — Telephone Encounter (Signed)
Will forward to MD and covering provider. Jazmin Hartsell,CMA  

## 2015-12-16 NOTE — Telephone Encounter (Signed)
LM on identified VM for Heather informing her of ok to do another home visit and also that patient would need an appt with PCP to discuss her request for pain patch.  Jazmin Hartsell,CMA

## 2015-12-16 NOTE — Telephone Encounter (Signed)
Please give authorization to [facility] for one more nurse home visit to follow up patient's BP/

## 2015-12-16 NOTE — Telephone Encounter (Signed)
Called [phone]  Left message for [name] to call my cell

## 2015-12-21 DIAGNOSIS — Z466 Encounter for fitting and adjustment of urinary device: Secondary | ICD-10-CM | POA: Diagnosis not present

## 2015-12-21 DIAGNOSIS — R339 Retention of urine, unspecified: Secondary | ICD-10-CM | POA: Diagnosis not present

## 2015-12-21 DIAGNOSIS — M4805 Spinal stenosis, thoracolumbar region: Secondary | ICD-10-CM | POA: Diagnosis not present

## 2015-12-21 DIAGNOSIS — I10 Essential (primary) hypertension: Secondary | ICD-10-CM | POA: Diagnosis not present

## 2015-12-26 DIAGNOSIS — R339 Retention of urine, unspecified: Secondary | ICD-10-CM | POA: Diagnosis not present

## 2015-12-26 DIAGNOSIS — I10 Essential (primary) hypertension: Secondary | ICD-10-CM | POA: Diagnosis not present

## 2015-12-26 DIAGNOSIS — Z466 Encounter for fitting and adjustment of urinary device: Secondary | ICD-10-CM | POA: Diagnosis not present

## 2015-12-26 DIAGNOSIS — M4805 Spinal stenosis, thoracolumbar region: Secondary | ICD-10-CM | POA: Diagnosis not present

## 2015-12-27 ENCOUNTER — Ambulatory Visit: Payer: Medicare Other

## 2015-12-27 ENCOUNTER — Telehealth: Payer: Self-pay | Admitting: *Deleted

## 2015-12-27 DIAGNOSIS — M4805 Spinal stenosis, thoracolumbar region: Secondary | ICD-10-CM | POA: Diagnosis not present

## 2015-12-27 DIAGNOSIS — I10 Essential (primary) hypertension: Secondary | ICD-10-CM | POA: Diagnosis not present

## 2015-12-27 DIAGNOSIS — R339 Retention of urine, unspecified: Secondary | ICD-10-CM | POA: Diagnosis not present

## 2015-12-27 DIAGNOSIS — Z466 Encounter for fitting and adjustment of urinary device: Secondary | ICD-10-CM | POA: Diagnosis not present

## 2015-12-27 NOTE — Telephone Encounter (Signed)
[name], RN with [facility] left voice message on nurse line stating patient will be discharged from nursing services.  Patient has reached her goals with nursing services.  Patient heart rate 53 and blood pressure 110/60.  However;  There was some extra heart beats.  Patient could sense the irregularities but denied any dizziness, lightheadedness and palpations.   Patient is requesting a pain patch for right knee.  Pain level is 8/10 constant pain with no relief with any other methods patient has been doing at home.  Please give patient a call to discuss right knee pain.  Please call [name] with questions at [phone].  Derl Barrow, RN

## 2015-12-28 NOTE — Telephone Encounter (Signed)
Reviewed note. Will discuss other treatment options for knee pain on next office visit.

## 2015-12-29 DIAGNOSIS — I10 Essential (primary) hypertension: Secondary | ICD-10-CM | POA: Diagnosis not present

## 2015-12-29 DIAGNOSIS — R339 Retention of urine, unspecified: Secondary | ICD-10-CM | POA: Diagnosis not present

## 2015-12-29 DIAGNOSIS — Z466 Encounter for fitting and adjustment of urinary device: Secondary | ICD-10-CM | POA: Diagnosis not present

## 2015-12-29 DIAGNOSIS — M4805 Spinal stenosis, thoracolumbar region: Secondary | ICD-10-CM | POA: Diagnosis not present

## 2016-01-03 DIAGNOSIS — M4805 Spinal stenosis, thoracolumbar region: Secondary | ICD-10-CM | POA: Diagnosis not present

## 2016-01-03 DIAGNOSIS — Z466 Encounter for fitting and adjustment of urinary device: Secondary | ICD-10-CM | POA: Diagnosis not present

## 2016-01-03 DIAGNOSIS — I10 Essential (primary) hypertension: Secondary | ICD-10-CM | POA: Diagnosis not present

## 2016-01-03 DIAGNOSIS — R339 Retention of urine, unspecified: Secondary | ICD-10-CM | POA: Diagnosis not present

## 2016-01-06 ENCOUNTER — Ambulatory Visit
Admission: RE | Admit: 2016-01-06 | Discharge: 2016-01-06 | Disposition: A | Discharge status: Home / Self Care | Payer: Medicare Other | Source: Ambulatory Visit

## 2016-01-06 DIAGNOSIS — Z1231 Encounter for screening mammogram for malignant neoplasm of breast: Secondary | ICD-10-CM | POA: Diagnosis not present

## 2016-01-16 ENCOUNTER — Other Ambulatory Visit: Payer: Self-pay | Admitting: Family Medicine

## 2016-01-16 DIAGNOSIS — G8929 Other chronic pain: Secondary | ICD-10-CM

## 2016-01-16 DIAGNOSIS — M549 Dorsalgia, unspecified: Secondary | ICD-10-CM

## 2016-01-16 DIAGNOSIS — M150 Primary generalized (osteo)arthritis: Secondary | ICD-10-CM

## 2016-01-16 DIAGNOSIS — G629 Polyneuropathy, unspecified: Secondary | ICD-10-CM

## 2016-01-16 DIAGNOSIS — M47812 Spondylosis without myelopathy or radiculopathy, cervical region: Secondary | ICD-10-CM

## 2016-01-16 DIAGNOSIS — M159 Polyosteoarthritis, unspecified: Secondary | ICD-10-CM

## 2016-01-16 DIAGNOSIS — M160 Bilateral primary osteoarthritis of hip: Secondary | ICD-10-CM

## 2016-01-16 DIAGNOSIS — M170 Bilateral primary osteoarthritis of knee: Secondary | ICD-10-CM

## 2016-01-16 DIAGNOSIS — G608 Other hereditary and idiopathic neuropathies: Secondary | ICD-10-CM

## 2016-01-16 DIAGNOSIS — M48061 Spinal stenosis, lumbar region without neurogenic claudication: Secondary | ICD-10-CM

## 2016-01-16 DIAGNOSIS — M4804 Spinal stenosis, thoracic region: Secondary | ICD-10-CM

## 2016-01-16 MED ORDER — HYDROCODONE-ACETAMINOPHEN 10-325 MG PO TABS: 1.0000 | ORAL_TABLET | Freq: Two times a day (BID) | ORAL | Status: DC | PRN

## 2016-01-16 NOTE — Telephone Encounter (Signed)
Needs refill on pain medicatiion

## 2016-01-16 NOTE — Telephone Encounter (Signed)
Patient's daughter is aware of this.  Destanie Tibbetts,CMA

## 2016-01-16 NOTE — Telephone Encounter (Signed)
Pt called and needs a refill on her Hydrocodone left up front. Please call when ready for pick up. jw

## 2016-01-30 ENCOUNTER — Telehealth: Payer: Self-pay | Admitting: Family Medicine

## 2016-01-30 DIAGNOSIS — N390 Urinary tract infection, site not specified: Secondary | ICD-10-CM | POA: Insufficient documentation

## 2016-01-30 HISTORY — DX: Urinary tract infection, site not specified: N39.0

## 2016-01-30 MED ORDER — CEPHALEXIN 500 MG PO CAPS: 500.0000 mg | ORAL_CAPSULE | Freq: Four times a day (QID) | ORAL | Status: DC

## 2016-01-30 NOTE — Telephone Encounter (Signed)
Will forward to MD to see if medication can be called in or if she will need to be seen. Kamran Coker,CMA

## 2016-01-30 NOTE — Telephone Encounter (Signed)
I spoke with Ms Carbine' primary caretaker, [name], her dgt.  Clinical information was obtained from [place].    Patient complains of abnormal smelling urine, foul smelling urine, pain suprapubic, suprapubic pressure and vomiting She has had symptoms for 1 week. Patient also complains of Chills and malaise. Patient denies fever. Patient does have a history of recurrent UTI.  Patient does have a history of pyelonephritis. Patient has a neurogenic bladder and must perform self catheterization.  She has grown E coli in past, has responded to Keflex well previously without intolerance.    A/ Possible Catheter-Associated UTI      Hx of E.coli UTIs, not ESBL type.  P/ Empiric Rx Keflex 500 mg oral four times a day for 10 days.       Pt is to come into office or ED if not improved after two days of abx or if her condition worsens.

## 2016-01-30 NOTE — Telephone Encounter (Signed)
Daughter called because her mother has another bladder infection and would like the doctor to call in some medication for this. jw

## 2016-02-14 ENCOUNTER — Other Ambulatory Visit: Payer: Self-pay | Admitting: Family Medicine

## 2016-02-14 DIAGNOSIS — M47812 Spondylosis without myelopathy or radiculopathy, cervical region: Secondary | ICD-10-CM

## 2016-02-14 DIAGNOSIS — G608 Other hereditary and idiopathic neuropathies: Secondary | ICD-10-CM

## 2016-02-14 DIAGNOSIS — M170 Bilateral primary osteoarthritis of knee: Secondary | ICD-10-CM

## 2016-02-14 DIAGNOSIS — G629 Polyneuropathy, unspecified: Secondary | ICD-10-CM

## 2016-02-14 DIAGNOSIS — M150 Primary generalized (osteo)arthritis: Secondary | ICD-10-CM

## 2016-02-14 DIAGNOSIS — M160 Bilateral primary osteoarthritis of hip: Secondary | ICD-10-CM

## 2016-02-14 DIAGNOSIS — M4804 Spinal stenosis, thoracic region: Secondary | ICD-10-CM

## 2016-02-14 DIAGNOSIS — M48061 Spinal stenosis, lumbar region without neurogenic claudication: Secondary | ICD-10-CM

## 2016-02-14 DIAGNOSIS — M549 Dorsalgia, unspecified: Secondary | ICD-10-CM

## 2016-02-14 DIAGNOSIS — M159 Polyosteoarthritis, unspecified: Secondary | ICD-10-CM

## 2016-02-14 DIAGNOSIS — G8929 Other chronic pain: Secondary | ICD-10-CM

## 2016-02-14 MED ORDER — POLYETHYLENE GLYCOL 3350 17 GM/SCOOP PO POWD: 17.0000 g | Freq: Every day | ORAL | Status: DC | PRN

## 2016-02-14 NOTE — Addendum Note (Signed)
Addended by: Derl Barrow on: 02/14/2016 11:37 AM   Modules accepted: Orders

## 2016-02-14 NOTE — Telephone Encounter (Signed)
Requesting rx for refill on hydrocodone.

## 2016-02-15 MED ORDER — HYDROCODONE-ACETAMINOPHEN 10-325 MG PO TABS: 1.0000 | ORAL_TABLET | Freq: Two times a day (BID) | ORAL | Status: DC | PRN

## 2016-02-15 NOTE — Telephone Encounter (Signed)
Hand written Rx

## 2016-02-16 ENCOUNTER — Telehealth: Payer: Self-pay | Admitting: *Deleted

## 2016-02-16 NOTE — Telephone Encounter (Signed)
-----   Message from Blane Ohara McDiarmid, MD sent at 02/15/2016  2:50 PM EST ----- Please let patient know her prescription(s) are available for pick up from the [facility] front desk.

## 2016-02-16 NOTE — Telephone Encounter (Signed)
Patient is aware of this and states that her daughter has already picked it up.  Also she wants to thank Dr. McDiarmid for filling her script for her constipation.  Jazmin Hartsell,CMA

## 2016-02-29 DIAGNOSIS — R339 Retention of urine, unspecified: Secondary | ICD-10-CM | POA: Diagnosis not present

## 2016-03-05 DIAGNOSIS — Z0000 Encounter for general adult medical examination without abnormal findings: Secondary | ICD-10-CM | POA: Diagnosis not present

## 2016-03-05 DIAGNOSIS — N3020 Other chronic cystitis without hematuria: Secondary | ICD-10-CM | POA: Diagnosis not present

## 2016-03-12 ENCOUNTER — Encounter: Payer: Self-pay | Admitting: Family Medicine

## 2016-03-12 ENCOUNTER — Ambulatory Visit (INDEPENDENT_AMBULATORY_CARE_PROVIDER_SITE_OTHER): Payer: Medicare Other | Admitting: Family Medicine

## 2016-03-12 VITALS — BP 142/70 | HR 91 | Temp 98.5°F | Wt 137.0 lb

## 2016-03-12 DIAGNOSIS — M4804 Spinal stenosis, thoracic region: Secondary | ICD-10-CM | POA: Diagnosis not present

## 2016-03-12 DIAGNOSIS — G8929 Other chronic pain: Secondary | ICD-10-CM

## 2016-03-12 DIAGNOSIS — G629 Polyneuropathy, unspecified: Secondary | ICD-10-CM

## 2016-03-12 DIAGNOSIS — M4806 Spinal stenosis, lumbar region: Secondary | ICD-10-CM

## 2016-03-12 DIAGNOSIS — R102 Pelvic and perineal pain: Secondary | ICD-10-CM | POA: Diagnosis not present

## 2016-03-12 DIAGNOSIS — M150 Primary generalized (osteo)arthritis: Secondary | ICD-10-CM

## 2016-03-12 DIAGNOSIS — M48061 Spinal stenosis, lumbar region without neurogenic claudication: Secondary | ICD-10-CM

## 2016-03-12 DIAGNOSIS — M47812 Spondylosis without myelopathy or radiculopathy, cervical region: Secondary | ICD-10-CM

## 2016-03-12 DIAGNOSIS — M170 Bilateral primary osteoarthritis of knee: Secondary | ICD-10-CM

## 2016-03-12 DIAGNOSIS — M160 Bilateral primary osteoarthritis of hip: Secondary | ICD-10-CM

## 2016-03-12 DIAGNOSIS — M549 Dorsalgia, unspecified: Secondary | ICD-10-CM

## 2016-03-12 DIAGNOSIS — G608 Other hereditary and idiopathic neuropathies: Secondary | ICD-10-CM

## 2016-03-12 DIAGNOSIS — M159 Polyosteoarthritis, unspecified: Secondary | ICD-10-CM

## 2016-03-12 MED ORDER — HYDROCODONE-ACETAMINOPHEN 10-325 MG PO TABS: 1.0000 | ORAL_TABLET | Freq: Two times a day (BID) | ORAL | Status: DC | PRN

## 2016-03-12 MED ORDER — LIDOCAINE HCL 2 % EX GEL: CUTANEOUS | Status: DC

## 2016-03-12 NOTE — Progress Notes (Signed)
Patient ID: Nicole Perez, female   DOB: June 11, 1933, 82 y.o.   MRN: [id number]   Subjective:   Nicole Perez is a 82 y.o. female with a complex GU history including hysterectomy at age 19, 2 bladder sling/tacs, h/o pessary use presenting with   Here for  Chief Complaint  Patient presents with  . Vaginal Pain    x 2 weeks  . Hemorrhoids    x 2 weeks   Reviewed extensive history wit patient and daughter. The patient was seen at urology on Thursday for this same complaint. They tested for UTI and she did not have one. She currently self caths to alleviate urine. Reports feeling fullness in her vagina and pain at the base of her vagina   There are no preventive care reminders to display for this patient.  Review of Systems:   Per HPI. All other systems reviewed and are negative expect as in HPI   PMH, PSH, Medications, Allergies, SocialHx and FHx reviewed and updated in EMR- marked as reviewed 03/12/2016   Objective:  BP 142/70 mmHg  Pulse 91  Temp(Src) 98.5 F (36.9 C) (Oral)  Wt 137 lb (62.143 kg)  LMP 12/17/1962  Gen:  82 y.o. female in NAD. Speaking in full sentences. Good eye contact HEENT: NCAT, MMM, EOMI, PERRL, anicteric sclerae.  CV: RRR, no MRG, no JVD Resp: Normal WOB. Non-labored, CTAB, no wheezes noted GI: Soft, NTND GU: + cystoceole present just proximal to entroitus. TTP on perineum, this is the location of her pain.  Rectal: hemorroid at 6 'clock towards gluteal cleft no prolapse Ext: WWP, no edema MSK: Intact gait. Full ROM Neuro: Alert and oriented, speech normal Pysch: Normal mood and affect.   Assessment:     Nicole Perez is a 82 y.o. female here for perineal pain/vaginal pain    Plan:   Perineal pain: - discussed complex nature of problem - trial of topical lidocaine - encouraged to discuss with specialty care.    Face to face time:  25 minutes  Greater than 50% of the visit time was spent in counseling and coordination of care with the  patient.  The summary and outline of the counseling and care coordination is summarized in the note above.   All questions were answered.  Caren Macadam, MD, AB[facility] OB Fellow, Faculty Practice 03/12/2016  5:02 PM

## 2016-03-17 DIAGNOSIS — K921 Melena: Secondary | ICD-10-CM

## 2016-03-17 HISTORY — DX: Melena: K92.1

## 2016-04-02 ENCOUNTER — Other Ambulatory Visit: Payer: Self-pay | Admitting: Family Medicine

## 2016-04-09 ENCOUNTER — Encounter (HOSPITAL_COMMUNITY): Payer: Self-pay

## 2016-04-09 ENCOUNTER — Ambulatory Visit: Payer: Medicare Other | Admitting: Family Medicine

## 2016-04-09 ENCOUNTER — Other Ambulatory Visit: Payer: Self-pay

## 2016-04-09 ENCOUNTER — Emergency Department (HOSPITAL_COMMUNITY): Payer: Medicare Other

## 2016-04-09 ENCOUNTER — Observation Stay (HOSPITAL_COMMUNITY)
Admission: EM | Admit: 2016-04-09 | Discharge: 2016-04-12 | Disposition: A | Discharge status: Home / Self Care | Payer: Medicare Other | Attending: Family Medicine | Admitting: Family Medicine

## 2016-04-09 DIAGNOSIS — K31819 Angiodysplasia of stomach and duodenum without bleeding: Secondary | ICD-10-CM

## 2016-04-09 DIAGNOSIS — N319 Neuromuscular dysfunction of bladder, unspecified: Secondary | ICD-10-CM | POA: Diagnosis not present

## 2016-04-09 DIAGNOSIS — Z79899 Other long term (current) drug therapy: Secondary | ICD-10-CM | POA: Diagnosis not present

## 2016-04-09 DIAGNOSIS — K5900 Constipation, unspecified: Secondary | ICD-10-CM | POA: Insufficient documentation

## 2016-04-09 DIAGNOSIS — I440 Atrioventricular block, first degree: Secondary | ICD-10-CM | POA: Insufficient documentation

## 2016-04-09 DIAGNOSIS — I10 Essential (primary) hypertension: Secondary | ICD-10-CM | POA: Diagnosis not present

## 2016-04-09 DIAGNOSIS — D62 Acute posthemorrhagic anemia: Secondary | ICD-10-CM | POA: Insufficient documentation

## 2016-04-09 DIAGNOSIS — M159 Polyosteoarthritis, unspecified: Secondary | ICD-10-CM | POA: Insufficient documentation

## 2016-04-09 DIAGNOSIS — R079 Chest pain, unspecified: Secondary | ICD-10-CM | POA: Diagnosis not present

## 2016-04-09 DIAGNOSIS — E669 Obesity, unspecified: Secondary | ICD-10-CM | POA: Diagnosis not present

## 2016-04-09 DIAGNOSIS — Z66 Do not resuscitate: Secondary | ICD-10-CM | POA: Diagnosis not present

## 2016-04-09 DIAGNOSIS — Z6828 Body mass index (BMI) 28.0-28.9, adult: Secondary | ICD-10-CM | POA: Diagnosis not present

## 2016-04-09 DIAGNOSIS — R0602 Shortness of breath: Secondary | ICD-10-CM | POA: Diagnosis not present

## 2016-04-09 DIAGNOSIS — R001 Bradycardia, unspecified: Secondary | ICD-10-CM | POA: Insufficient documentation

## 2016-04-09 DIAGNOSIS — K921 Melena: Secondary | ICD-10-CM | POA: Diagnosis not present

## 2016-04-09 DIAGNOSIS — Z87891 Personal history of nicotine dependence: Secondary | ICD-10-CM | POA: Insufficient documentation

## 2016-04-09 DIAGNOSIS — R7303 Prediabetes: Secondary | ICD-10-CM | POA: Insufficient documentation

## 2016-04-09 DIAGNOSIS — D649 Anemia, unspecified: Secondary | ICD-10-CM | POA: Diagnosis not present

## 2016-04-09 DIAGNOSIS — R110 Nausea: Secondary | ICD-10-CM | POA: Diagnosis not present

## 2016-04-09 DIAGNOSIS — K31811 Angiodysplasia of stomach and duodenum with bleeding: Secondary | ICD-10-CM | POA: Insufficient documentation

## 2016-04-09 DIAGNOSIS — D638 Anemia in other chronic diseases classified elsewhere: Secondary | ICD-10-CM | POA: Diagnosis present

## 2016-04-09 HISTORY — DX: Angiodysplasia of stomach and duodenum without bleeding: K31.819

## 2016-04-09 HISTORY — DX: Polyp of colon: K63.5

## 2016-04-09 HISTORY — DX: Overflow incontinence: N39.490

## 2016-04-09 HISTORY — DX: Melena: K92.1

## 2016-04-09 HISTORY — DX: Urinary tract infection, site not specified: N39.0

## 2016-04-09 HISTORY — DX: Chest pain, unspecified: R07.9

## 2016-04-09 HISTORY — DX: Anemia, unspecified: D64.9

## 2016-04-09 LAB — COMPREHENSIVE METABOLIC PANEL
ALBUMIN: 3.1 g/dL — AB (ref 3.5–5.0)
ALT: 11 U/L — ABNORMAL LOW (ref 14–54)
ANION GAP: 11 (ref 5–15)
AST: 17 U/L (ref 15–41)
Alkaline Phosphatase: 39 U/L (ref 38–126)
BUN: 53 mg/dL — ABNORMAL HIGH (ref 6–20)
CO2: 19 mmol/L — ABNORMAL LOW (ref 22–32)
Calcium: 8.3 mg/dL — ABNORMAL LOW (ref 8.9–10.3)
Chloride: 111 mmol/L (ref 101–111)
Creatinine, Ser: 1.01 mg/dL — ABNORMAL HIGH (ref 0.44–1.00)
GFR calc Af Amer: 59 mL/min — ABNORMAL LOW (ref >60)
GFR calc non Af Amer: 51 mL/min — ABNORMAL LOW (ref >60)
GLUCOSE: 117 mg/dL — AB (ref 65–99)
POTASSIUM: 3.5 mmol/L (ref 3.5–5.1)
SODIUM: 141 mmol/L (ref 135–145)
Total Bilirubin: 0.2 mg/dL — ABNORMAL LOW (ref 0.3–1.2)
Total Protein: 5.5 g/dL — ABNORMAL LOW (ref 6.5–8.1)

## 2016-04-09 LAB — CBC WITH DIFFERENTIAL/PLATELET
BASOS PCT: 0 %
Basophils Absolute: 0 10*3/uL (ref 0.0–0.1)
EOS ABS: 0 10*3/uL (ref 0.0–0.7)
EOS PCT: 0 %
HCT: 20.9 % — ABNORMAL LOW (ref 36.0–46.0)
Hemoglobin: 6.7 g/dL — CL (ref 12.0–15.0)
Lymphocytes Relative: 23 %
Lymphs Abs: 1.7 10*3/uL (ref 0.7–4.0)
MCH: 27.9 pg (ref 26.0–34.0)
MCHC: 32.1 g/dL (ref 30.0–36.0)
MCV: 87.1 fL (ref 78.0–100.0)
MONO ABS: 0.3 10*3/uL (ref 0.1–1.0)
MONOS PCT: 4 %
NEUTROS PCT: 73 %
Neutro Abs: 5.5 10*3/uL (ref 1.7–7.7)
PLATELETS: 203 10*3/uL (ref 150–400)
RBC: 2.4 MIL/uL — ABNORMAL LOW (ref 3.87–5.11)
RDW: 14.6 % (ref 11.5–15.5)
WBC: 7.5 10*3/uL (ref 4.0–10.5)

## 2016-04-09 LAB — PROTIME-INR
INR: 1.25 (ref 0.00–1.49)
Prothrombin Time: 15.8 seconds — ABNORMAL HIGH (ref 11.6–15.2)

## 2016-04-09 LAB — TSH
TSH: 1.112 u[IU]/mL (ref 0.350–4.500)
TSH: 1.491 u[IU]/mL (ref 0.350–4.500)

## 2016-04-09 LAB — TROPONIN I
Troponin I: <0.03 ng/mL (ref <0.031)
Troponin I: <0.03 ng/mL (ref <0.031)

## 2016-04-09 LAB — LIPID PANEL
CHOL/HDL RATIO: 4 ratio
Cholesterol: 108 mg/dL (ref 0–200)
HDL: 27 mg/dL — AB (ref >40)
LDL Cholesterol: 59 mg/dL (ref 0–99)
Triglycerides: 112 mg/dL (ref <150)
VLDL: 22 mg/dL (ref 0–40)

## 2016-04-09 LAB — PREPARE RBC (CROSSMATCH)

## 2016-04-09 LAB — POC OCCULT BLOOD, ED: Fecal Occult Bld: POSITIVE — AB

## 2016-04-09 LAB — BRAIN NATRIURETIC PEPTIDE: B NATRIURETIC PEPTIDE 5: 313.7 pg/mL — AB (ref 0.0–100.0)

## 2016-04-09 MED ORDER — SODIUM CHLORIDE 0.9 % IV SOLN
80.0000 mg | Freq: Two times a day (BID) | INTRAVENOUS | Status: DC
  Administered 2016-04-09 – 2016-04-10 (×2): 80 mg via INTRAVENOUS
  Filled 2016-04-09 (×5): qty 80

## 2016-04-09 MED ORDER — ATROPINE SULFATE 1 MG/ML IJ SOLN
0.5000 mg | Freq: Once | INTRAMUSCULAR | Status: AC
  Administered 2016-04-09: 0.5 mg via INTRAVENOUS
  Filled 2016-04-09: qty 1

## 2016-04-09 MED ORDER — SODIUM CHLORIDE 0.9 % IV BOLUS (SEPSIS)
500.0000 mL | Freq: Once | INTRAVENOUS | Status: AC
  Administered 2016-04-09: 500 mL via INTRAVENOUS

## 2016-04-09 MED ORDER — SODIUM CHLORIDE 0.9 % IV SOLN: Freq: Once | INTRAVENOUS | Status: DC

## 2016-04-09 MED ORDER — DEXTROSE-NACL 5-0.45 % IV SOLN
INTRAVENOUS | Status: DC
  Administered 2016-04-09 – 2016-04-10 (×2): via INTRAVENOUS
  Filled 2016-04-09 (×4): qty 1000

## 2016-04-09 MED ORDER — ONDANSETRON HCL 4 MG/2ML IJ SOLN
4.0000 mg | Freq: Four times a day (QID) | INTRAMUSCULAR | Status: DC | PRN
  Administered 2016-04-11: 4 mg via INTRAVENOUS
  Filled 2016-04-09: qty 2

## 2016-04-09 MED ORDER — SODIUM CHLORIDE 0.9 % IV SOLN: INTRAVENOUS | Status: DC

## 2016-04-09 MED ORDER — ACETAMINOPHEN 325 MG PO TABS
650.0000 mg | ORAL_TABLET | ORAL | Status: DC | PRN
  Administered 2016-04-10 – 2016-04-12 (×2): 650 mg via ORAL
  Filled 2016-04-09 (×2): qty 2

## 2016-04-09 MED ORDER — PANTOPRAZOLE SODIUM 40 MG IV SOLR
40.0000 mg | Freq: Once | INTRAVENOUS | Status: AC
  Administered 2016-04-09: 40 mg via INTRAVENOUS
  Filled 2016-04-09: qty 40

## 2016-04-09 MED ORDER — HYDROCODONE-ACETAMINOPHEN 10-325 MG PO TABS
1.0000 | ORAL_TABLET | Freq: Two times a day (BID) | ORAL | Status: DC | PRN
  Administered 2016-04-09: 1 via ORAL
  Filled 2016-04-09: qty 1

## 2016-04-09 MED ORDER — SODIUM CHLORIDE 0.9 % IV SOLN
INTRAVENOUS | Status: DC
  Administered 2016-04-09: 19:00:00 via INTRAVENOUS

## 2016-04-09 MED ORDER — ACETAMINOPHEN 325 MG PO TABS
650.0000 mg | ORAL_TABLET | Freq: Once | ORAL | Status: AC
Start: 2016-04-09 — End: 2016-04-09
  Administered 2016-04-09: 650 mg via ORAL
  Filled 2016-04-09: qty 2

## 2016-04-09 NOTE — ED Notes (Signed)
MD at bedside. 

## 2016-04-09 NOTE — ED Notes (Signed)
Pt states given ASA81 mg in ambulancea dn given 500 cc fluid.

## 2016-04-09 NOTE — H&P (Signed)
[facility] Admission History and Physical Service Pager: [phone]  Patient name: Nicole Perez Medical record number: [id number] Date of birth: 1933-01-02 Age: 82 y.o. Gender: female  Primary Care Provider: MCDIARMID,TODD D, MD Consultants: GI, cardiology Code Status: DNR (obtained on admission)  Chief Complaint: chest pain  Assessment and Plan: Nicole Perez is a 82 y.o. female presenting with chest pain and GI bleed. PMH is significant for hypertension, neurogenic bladder and OA  Symptomatic anemia in the setting of GI bleed: Hgb 6.7. B/l 11 as late as 09/2015. Patient with melanotic stool for two weeks. Had EGD in 03/2015 with gastrititis. Biopsy with H. Pylori. She reports completing treatment for two weeks. Not taking PPI. She also has colonoscopy at the same time which was significant for tubular polyps and diverticulosis. Conjunctivae pale on exam. GI exam with mild tenderness to palpation over RLQ. BUN/Cr ratio >30 suggesting upper GI bleed. She reports taking NSAID for pain. She was hypotensive to 80'/50's on presentation. However, HR in 50's. S/p 500 ml NS in ED - Admit to SDU. Attending Dr. Erin Hearing -Two bore IV lines in place - Type and screen  - Transfuse two units of blood - Post transfusion H&H - 3/4 MIVF given elevated BNP.  - GI consulted in ED. Follow up recs - Protonix 80 mg IV twice a day  - am CBC and BMP - Hold antihypertensive meds  Chest pain: exertional and retrosternal. No cardiac history. Also dyspnea and diaphoresis. Heart exam with bradycardia, SEM over RUSB and LUSB. HEART score 4. EKG in ED with junctional rhythm and unremarkable ST-T (EKG in the past with 1 deg AVB). Troponin negative x1. BNP elevated to 313 but no sign of fluid overload on exam. Lung exam without crackles. No orthopnea. Unlikely pneumonia with normal chest x-ray and no sign of infection. Doubt PE without tachycardia and tachypnea. However, HR unreliable given  junctional rhythm. She was bradycardic even when she was hypotensive to 80's/50's. - Card consult per order set - Cardiac monitor - Repeat ECG in AM (juctional rhythm in ED) - Cycle troponins (1st is neg) - Euvolemic but BNP elevated: judicious IVF's as above  - Lipid panel, Hb A1c, TSH. Most recent labs before 2015 - Nitroglycerin 0.4mg  SL q110min prn chest pain  - O2 by [place] prn hypoxemia - Echocardiogram given dyspnea, SEM and elevated BNP - Avoiding ASA in absence of definitive evidence of ischemia  Hypertension: hypotensive to 80's/50's on arrival to ED. On Dilt and HCTZ at home -Hold BP meds -Monitor BP  Osteoarthritis: history of back & LE OA. On Lorcet at home -Continue home Lorcet. - Avoid NSAIDs  Neurogenic bladder: she does self cath at home. No dysuria or sign of infection.  -I&O cath  FEN/GI: -NPO with chips -IVF as above -Protonix as above  PPX: SCD given GI bleed  Disposition: admit to SDU for GI bleed and ACS rule out  History of Present Illness:  Nicole Perez is a 82 y.o. female presenting with chest pain and symptomatic anemia secondary to GI bleeding.  This morning about 7:30 am, patient was coming out of bathroom when she felt chest pain in the middle of the chest, palpitation and like passing out. She called her daughter who called EMS .Chest pain radiated to her both arms per ED. She denies LOC. She was also dyspneic and diaphoretic, and has felt more weak over the past week. Symptoms improved upon laying down to rest, and she  denied chest pain upon arrival to ED. Marland Kitchen She denies orthopnea or new onset of edema. She never had chest pain like that before or history of heart attack. She reports compliance with her blood pressure medications. She took them last this morning.   Patient reports melanotic stool for the last two weeks. She denies hematochezia, and vomiting/hemetemesis, abdominal pain, but has felt nauseated. She says her stool also stick to the commode  and has to flush it multiple times. Denies abdominal pain or heart burn. She reports using over the counter ibuprofen for her arthritic pain. However, she didn't use NSAID in the last one week.   She has history of neurogenic bladder and does I&O cath at home. Denies dysuria, fever or chills.   Lives by herself. Ambulates with walker at baseline. Denies smoking, drinking and drug use.   Review Of Systems: Per HPI. No fevers, weight loss, night sweats, dysphagia, heartburn symptoms.  Otherwise the remainder of the systems were negative.  Patient Active Problem List   Diagnosis Date Noted  . Symptomatic anemia 04/09/2016  . Complicated UTI (urinary tract infection) 01/30/2016  . Urethral polyp 11/17/2015  . Gross hematuria 11/17/2015  . History of colonic polyps 05/05/2015  . Hiatal hernia 05/05/2015  . Dry eye syndrome 11/16/2014  . Glaucoma suspect, bilateral 11/16/2014  . Peroneal neuropathy 09/30/2014  . Cervical spondylosis without myelopathy 08/07/2014  . Right leg weakness 08/06/2014  . At risk for falls 08/06/2014  . Memory impairment 08/06/2014  . Peripheral painful sensory neuropathy 08/06/2014  . Overflow incontinence 04/28/2014  . Bladder neurogenous 02/09/2014  . Osteoarthritis, multiple sites 08/11/2013  . Cystocele 08/11/2013  . Osteoarthritis of both knees 04/22/2013  . Obesity, unspecified 04/22/2013  . Vitamin D deficiency 09/30/2012  . Hearing loss sensory, bilateral 07/26/2011  . Spinal stenosis of thoracic region 07/26/2011  . Spinal stenosis of lumbar region 07/26/2011  . Lichen sclerosus et atrophicus of the vulva   . HYPERLIPIDEMIA 05/10/2010  . Osteoarthritis 04/21/2010  . INSOMNIA, CHRONIC 04/18/2010  . Essential hypertension, benign 04/18/2010  . Chronic back pain 04/18/2010  . Osteoarthritis of both hips 09/11/2007    Past Medical History: Past Medical History  Diagnosis Date  . Essential hypertension, benign 04/18/2010  . Lichen sclerosus et  atrophicus of the vulva   . BACK PAIN, CHRONIC 04/18/2010  . DEGENERATIVE JOINT DISEASE, AXIAL SKELETON 04/21/2010  . DEGENERATIVE JOINT DISEASE, HIPS 09/11/2007  . INSOMNIA, CHRONIC 04/18/2010  . Parotid adenoma 1990, 2012    Right parotid, recurrent parotid pleimorphic adenoma.   Marland Kitchen Spinal stenosis of thoracic region 07/26/2011  . Spinal stenosis of lumbar region 07/26/2011  . Foraminal stenosis of lumbar region 07/26/2011  . Foraminal stenosis of thoracic region 07/26/2011  . Osteoarthritis, multiple sites 08/11/2013  . COLONIC POLYPS, HX OF 12/17/2004    Annotation: polypectomy during colonoscopy in 2006 Qualifier: Diagnosis of  By: McDiarmid MD, Sherren Mocha    . Cystocele 08/11/2013  . Fall     recently x3  . Acute renal failure ([place]) 02/23/2014  . Incontinence 02/09/2014  . Benign parotid tumor, right   . History of pneumonia 07/26/2011  . Rectal fissure 12/16/2013  . Urinary incontinence in female 02/04/2014  . UTI (urinary tract infection) 02/22/2014  . Right shoulder pain 09/30/2012    11/13 Xray Right shoulder: Right glenohumeral joint degenerative changes with osteophyte  formation along the inferior aspect of the glenoid.  Acromioclavicular joint degenerative changes with bony overgrowth.    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 Hearing loss sensory, bilateral 07/26/2011    Sensorineural Hearing Loss, bilateral, Right greater than Left.  Left ear hearing aid b/c work discrimination in       R. ear is very poor. Audiologist-Stephanie Nance at [facility] in [place].  (05/10/2010)   . Osteoarthritis of both knees 04/22/2013    Discussed use of low dose APAP and up to two tablets of hydrocodone/APA 7.5/325 a day as needed for painful exacerbation of knee pain.  Patient had 40 mg Solumedrol with 4 ml 1% lidocaine without epi injected into right knee with anterolateral approach after sterile prep.  No complications.      Marland Kitchen  HYPERLIPIDEMIA 05/10/2010    Qualifier: Diagnosis of  By: McDiarmid MD, Sherren Mocha    . Incontinence overflow, urine 04/28/2014  . Incomplete emptying of bladder 02/09/2014  . Falls 08/06/2014  . At risk for falls 08/06/2014  . Paresthesia of both feet 08/06/2014  . Orthostatic hypotension 08/06/2014  . Blepharitis 11/16/2014    Diagnosis by optometrist, Renaldo Harrison on exam 11/13/2014  . Dry eye syndrome 11/16/2014  . Glaucoma suspect 11/16/2014  . Blood in stool 12/20/2014  . Cholelithiasis   . Colon polyp   . H. pylori infection   . Hiatal hernia 05/05/2015    Large Hiatal Hernia found on EGD by Dr Hilarie Fredrickson (GI in [place]) in work up of melena and (+) FOBT.  Marland Kitchen Urethral polyp 11/17/2015    Past Surgical History: Past Surgical History  Procedure Laterality Date  . Total abdominal hysterectomy w/ bilateral salpingoophorectomy  1964    Hysterectomy and bilateral oopherectomy at age 12 for benign reasons  . Bladder suspension      Bladder tack x 2 (Dr Janice Norrie)  . Cystocele repair      Rectal prolapse and cyctocele adter hysterectomy requiring anterior repair Jerilynn Mages Edwinna Areola, MD)  . Rectocele repair      Rectal prolapse and cyctocele adter hysterectomy requiring anterior repair Jerilynn Mages Edwinna Areola, MD)  . Urethral dilation    . Parotid gland tumor excision  1990    S/P excision of Right Parotid Gland Benign Tumor, 1990  . Reconstruction of nose  1994    Nasal bridge reconstruction (Dr Judie Grieve, 1994) for following  Forklift accident on job. Surgery complicated by nerve damage resulting in difficulty raising right eyebrow   . Carpal tunnel release  2010    Carpel Tunnel Release of  left wrist  03/2009 (Dr Fredna Dow): Nerve   . Cataract extraction w/ intraocular lens implant  2010    Dr Charise Killian (ophth)  . Breast lumpectomy      Lumpectomy of benign Breast lumps bilaterally, Dr Bubba Camp  . Laminectomy      S/P L4-5 Laminectomy (1987) for decompression of spinal stenosis  . Colonoscopy w/ polypectomy  2006  .  Parotidectomy  08/30/11    Radene Journey, MD (ENT) for recurrent right parotid pleomorphic adenoma by frozen section  . Carpal tunnel release      right wrist    Social History: Social History  Substance Use Topics  . Smoking status: Former Smoker    Quit date: 01/14/1984  . Smokeless tobacco: Never Used  . Alcohol Use: No   Additional social history: none Please also refer to relevant sections of EMR.  Family History: Family History  Problem Relation Age of Onset  . Coronary artery disease Mother   .  Coronary artery disease Sister     open heart surgery  . Diabetes type II Sister   . Stroke Father 29  . Hypertension Father   . Lupus Brother   . Heart disease Brother   . Heart attack Sister     Allergies and Medications: Allergies  Allergen Reactions  . Nitrofurantoin Other (See Comments)    Malaise and profound fatigue    No current facility-administered medications on file prior to encounter.   Current Outpatient Prescriptions on File Prior to Encounter  Medication Sig Dispense Refill  . DILT-XR 180 MG 24 hr capsule TAKE 1 CAPSULE(180 MG) BY MOUTH DAILY 30 capsule 0  . hydrochlorothiazide (MICROZIDE) 12.5 MG capsule Take 1 capsule (12.5 mg total) by mouth daily. 45 capsule PRN  . HYDROcodone-acetaminophen (NORCO) 10-325 MG tablet Take 1 tablet by mouth every 12 (twelve) hours as needed for moderate pain or severe pain. 60 tablet 0  . lidocaine (XYLOCAINE) 2 % jelly Apply 1 dime size amount of gel to the area between your vagina and rectum once daily for pain. You may use up on 2 times per day if needed 85 mL 2  . polyethylene glycol powder (GLYCOLAX/MIRALAX) powder Take 17 g by mouth daily as needed for mild constipation, moderate constipation or severe constipation. 3350 g 3    Objective: BP 114/70 mmHg  Pulse 62  Temp(Src) 98.6 F (37 C)  Resp 12  Ht 4\' 11"  (1.499 m)  Wt 136 lb (61.689 kg)  BMI 27.45 kg/m2  SpO2 100%  LMP 12/17/1962 Exam: GEN: pleasant,  pale, elderly female lying in bed, appears well, NAD Eye: conjunctivae pale, PERL, arcus senilis and reflection of IOL.  Oropharynx: clear, moist, no ulcerations Neck: supple, no LAD CVS: irregular bradycardia, normal s1 and s2, SEM over RUSB and LUSB, no edema. subungual pallor but cap refill < 3 sec. RESP: no increased work of breathing, good air movement bilaterally, no crackles or wheeze GI:  +BS, soft, mild tender over RLQ, non-distended, no mass GU: no suprapubic tenderness NEURO: alert, awake and oriented, no gross defecits   Labs and Imaging: CBC BMET   Recent Labs Lab 04/09/16 1646  WBC 7.5  HGB 6.7*  HCT 20.9*  PLT 203    Recent Labs Lab 04/09/16 1646  NA 141  K 3.5  CL 111  CO2 19*  BUN 53*  CREATININE 1.01*  GLUCOSE 117*  CALCIUM 8.3*    Dg Chest 2 View  04/09/2016  CLINICAL DATA:  Centralized chest pain for 1 day. Squeezing sensation, palpitations for 1 day. EXAM: CHEST  2 VIEW COMPARISON:  02/22/2014 FINDINGS: Heart and mediastinal contours are within normal limits. No focal opacities or effusions. No acute bony abnormality. Degenerative changes in the shoulders. IMPRESSION: No active cardiopulmonary disease. Electronically Signed   By: Rolm Baptise M.D.   On: 04/09/2016 15:51    Mercy Riding, MD 04/09/2016, 7:16 PM PGY-1, [place] Intern pager: [phone], text pages welcome  I have seen and evaluated the patient with Dr. Cyndia Skeeters. I am in agreement with the note above in its revised form. My additions are in red.  Dvon Jiles B. Bonner Puna, MD, PGY-3 04/09/2016 10:41 PM

## 2016-04-09 NOTE — ED Provider Notes (Signed)
CSN: [id number]     Arrival date & time 04/09/16  1500 History   First MD Initiated Contact with Patient 04/09/16 1517     Chief Complaint  Patient presents with  . Chest Pain     (Consider location/radiation/quality/duration/timing/severity/associated sxs/prior Treatment) HPI Comments: Patient presents with central chest pain has been intermittent throughout the day today. It lasts several minutes at a time reduced to her bilateral arms. She is chest pain-free at this time however. She describes the pain as sharp and aching lasts several minutes and does not radiate to her back. There is no shortness of breath or nausea. She does report some near syncope and family she is going to pass out. He's never had this kind of pain in the past and denies any cardiac history. He was initially hypotensive for EMS. Blood pressure on arrival is [id number] systolic. She reports no chest pain currently. She reports no abdominal pain or back pain. She is complaining of chronic pain in her extremities from her arthritis and complains of her feet feeling cold.  Patient is a 82 y.o. female presenting with chest pain. The history is provided by the patient and the EMS personnel.  Chest Pain Associated symptoms: no abdominal pain, no cough, no dizziness, no fever, no headache, no nausea, no shortness of breath and not vomiting     Past Medical History  Diagnosis Date  . Essential hypertension, benign 04/18/2010  . Lichen sclerosus et atrophicus of the vulva   . BACK PAIN, CHRONIC 04/18/2010  . DEGENERATIVE JOINT DISEASE, AXIAL SKELETON 04/21/2010  . DEGENERATIVE JOINT DISEASE, HIPS 09/11/2007  . INSOMNIA, CHRONIC 04/18/2010  . Parotid adenoma 1990, 2012    Right parotid, recurrent parotid pleimorphic adenoma.   Marland Kitchen Spinal stenosis of thoracic region 07/26/2011  . Spinal stenosis of lumbar region 07/26/2011  . Foraminal stenosis of lumbar region 07/26/2011  . Foraminal stenosis of thoracic region 07/26/2011  . Osteoarthritis,  multiple sites 08/11/2013  . COLONIC POLYPS, HX OF 12/17/2004    Annotation: polypectomy during colonoscopy in 2006 Qualifier: Diagnosis of  By: McDiarmid MD, Sherren Mocha    . Cystocele 08/11/2013  . Fall     recently x3  . Acute renal failure ([place]) 02/23/2014  . Incontinence 02/09/2014  . Benign parotid tumor, right   . History of pneumonia 07/26/2011  . Rectal fissure 12/16/2013  . Urinary incontinence in female 02/04/2014  . UTI (urinary tract infection) 02/22/2014  . Right shoulder pain 09/30/2012    11/13 Xray Right shoulder: Right glenohumeral joint degenerative changes with osteophyte  formation along the inferior aspect of the glenoid.  Acromioclavicular joint degenerative changes with bony overgrowth.    Cathleen Fears ACETABULAR LABRAL TEAR BY MRI 09/11/2007    Qualifier: Diagnosis of  By: McDiarmid MD, Sherren Mocha    . Hearing loss sensory, bilateral 07/26/2011    Sensorineural Hearing Loss, bilateral, Right greater than Left.  Left ear hearing aid b/c work discrimination in       R. ear is very poor. Audiologist-Stephanie Nance at [facility] in [place].  (05/10/2010)   . Osteoarthritis of both knees 04/22/2013    Discussed use of low dose APAP and up to two tablets of hydrocodone/APA 7.5/325 a day as needed for painful exacerbation of knee pain.  Patient had 40 mg Solumedrol with 4 ml 1% lidocaine without epi injected into right knee with anterolateral approach after sterile prep.  No complications.      Marland Kitchen HYPERLIPIDEMIA 05/10/2010    Qualifier: Diagnosis  of  By: McDiarmid MD, Sherren Mocha    . Incontinence overflow, urine 04/28/2014  . Incomplete emptying of bladder 02/09/2014  . Falls 08/06/2014  . At risk for falls 08/06/2014  . Paresthesia of both feet 08/06/2014  . Orthostatic hypotension 08/06/2014  . Blepharitis 11/16/2014    Diagnosis by optometrist, Renaldo Harrison on exam 11/13/2014  . Dry eye syndrome 11/16/2014  . Glaucoma suspect 11/16/2014  . Blood in stool 12/20/2014  . Cholelithiasis   . Colon  polyp   . H. pylori infection   . Hiatal hernia 05/05/2015    Large Hiatal Hernia found on EGD by Dr Hilarie Fredrickson (GI in [place]) in work up of melena and (+) FOBT.  Marland Kitchen Urethral polyp 11/17/2015   Past Surgical History  Procedure Laterality Date  . Total abdominal hysterectomy w/ bilateral salpingoophorectomy  1964    Hysterectomy and bilateral oopherectomy at age 89 for benign reasons  . Bladder suspension      Bladder tack x 2 (Dr Janice Norrie)  . Cystocele repair      Rectal prolapse and cyctocele adter hysterectomy requiring anterior repair Jerilynn Mages Edwinna Areola, MD)  . Rectocele repair      Rectal prolapse and cyctocele adter hysterectomy requiring anterior repair Jerilynn Mages Edwinna Areola, MD)  . Urethral dilation    . Parotid gland tumor excision  1990    S/P excision of Right Parotid Gland Benign Tumor, 1990  . Reconstruction of nose  1994    Nasal bridge reconstruction (Dr Judie Grieve, 1994) for following  Forklift accident on job. Surgery complicated by nerve damage resulting in difficulty raising right eyebrow   . Carpal tunnel release  2010    Carpel Tunnel Release of  left wrist  03/2009 (Dr Fredna Dow): Nerve   . Cataract extraction w/ intraocular lens implant  2010    Dr Charise Killian (ophth)  . Breast lumpectomy      Lumpectomy of benign Breast lumps bilaterally, Dr Bubba Camp  . Laminectomy      S/P L4-5 Laminectomy (1987) for decompression of spinal stenosis  . Colonoscopy w/ polypectomy  2006  . Parotidectomy  08/30/11    Radene Journey, MD (ENT) for recurrent right parotid pleomorphic adenoma by frozen section  . Carpal tunnel release      right wrist   Family History  Problem Relation Age of Onset  . Coronary artery disease Mother   . Coronary artery disease Sister     open heart surgery  . Diabetes type II Sister   . Stroke Father 62  . Hypertension Father   . Lupus Brother   . Heart disease Brother   . Heart attack Sister    Social History  Substance Use Topics  . Smoking status: Former Smoker     Quit date: 01/14/1984  . Smokeless tobacco: Never Used  . Alcohol Use: No   OB History    Gravida Para Term Preterm AB TAB SAB Ectopic Multiple Living   8 8        7       Obstetric Comments   One daughter passed away one year ago All vaginal deliveries     Review of Systems  Constitutional: Negative for fever, activity change and appetite change.  HENT: Negative for congestion.   Respiratory: Negative for cough, chest tightness and shortness of breath.   Cardiovascular: Positive for chest pain.  Gastrointestinal: Negative for nausea, vomiting and abdominal pain.  Genitourinary: Negative for dysuria, hematuria, vaginal bleeding and vaginal discharge.  Musculoskeletal: Positive for myalgias  and arthralgias. Negative for neck pain.  Skin: Negative for rash.  Neurological: Negative for dizziness, light-headedness and headaches.  A complete 10 system review of systems was obtained and all systems are negative except as noted in the HPI and PMH.      Allergies  Nitrofurantoin  Home Medications   Prior to Admission medications   Medication Sig Start Date End Date Taking? Authorizing Provider  DILT-XR 180 MG 24 hr capsule TAKE 1 CAPSULE(180 MG) BY MOUTH DAILY 04/04/16  Yes Blane Ohara McDiarmid, MD  hydrochlorothiazide (MICROZIDE) 12.5 MG capsule Take 1 capsule (12.5 mg total) by mouth daily. 10/05/15  Yes Blane Ohara McDiarmid, MD  HYDROcodone-acetaminophen (NORCO) 10-325 MG tablet Take 1 tablet by mouth every 12 (twelve) hours as needed for moderate pain or severe pain. 03/12/16  Yes Caren Macadam, MD  lidocaine (XYLOCAINE) 2 % jelly Apply 1 dime size amount of gel to the area between your vagina and rectum once daily for pain. You may use up on 2 times per day if needed 03/12/16  Yes Caren Macadam, MD  magnesium hydroxide (MILK OF MAGNESIA) 400 MG/5ML suspension Take 30 mLs by mouth daily as needed for mild constipation.   Yes Historical Provider, MD  polyethylene glycol powder  (GLYCOLAX/MIRALAX) powder Take 17 g by mouth daily as needed for mild constipation, moderate constipation or severe constipation. 02/14/16  Yes Todd D McDiarmid, MD   BP 93/43 mmHg  Pulse 42  Temp(Src) 97.9 F (36.6 C) (Oral)  Resp 18  Ht 4\' 11"  (1.499 m)  Wt 128 lb 12.8 oz (58.423 kg)  BMI 26.00 kg/m2  SpO2 100%  LMP 12/17/1962 Physical Exam  Constitutional: She is oriented to person, place, and time. She appears well-developed and well-nourished. No distress.  HENT:  Head: Normocephalic and atraumatic.  Mouth/Throat: Oropharynx is clear and moist. No oropharyngeal exudate.  Eyes: Conjunctivae and EOM are normal. Pupils are equal, round, and reactive to light.  Neck: Normal range of motion. Neck supple.  No meningismus.  Cardiovascular: Normal rate, normal heart sounds and intact distal pulses.   No murmur heard. Irregular rhythm  Pulmonary/Chest: Effort normal and breath sounds normal. No respiratory distress.  Abdominal: Soft. There is no tenderness. There is no rebound and no guarding.  Musculoskeletal: Normal range of motion. She exhibits no edema or tenderness.  Feet are cool bilaterally.unable to palpate DP or PT pulses but present with doppler  Neurological: She is alert and oriented to person, place, and time. No cranial nerve deficit. She exhibits normal muscle tone. Coordination normal.  No ataxia on finger to nose bilaterally. No pronator drift. 5/5 strength throughout. CN 2-12 intact.Equal grip strength. Sensation intact.   Skin: Skin is warm.  Psychiatric: She has a normal mood and affect. Her behavior is normal.  Nursing note and vitals reviewed.   ED Course  Procedures (including critical care time) Labs Review Labs Reviewed  CBC WITH DIFFERENTIAL/PLATELET - Abnormal; Notable for the following:    RBC 2.40 (*)    Hemoglobin 6.7 (*)    HCT 20.9 (*)    All other components within normal limits  COMPREHENSIVE METABOLIC PANEL - Abnormal; Notable for the  following:    CO2 19 (*)    Glucose, Bld 117 (*)    BUN 53 (*)    Creatinine, Ser 1.01 (*)    Calcium 8.3 (*)    Total Protein 5.5 (*)    Albumin 3.1 (*)    ALT 11 (*)  Total Bilirubin 0.2 (*)    GFR calc non Af Amer 51 (*)    GFR calc Af Amer 59 (*)    All other components within normal limits  BRAIN NATRIURETIC PEPTIDE - Abnormal; Notable for the following:    B Natriuretic Peptide 313.7 (*)    All other components within normal limits  PROTIME-INR - Abnormal; Notable for the following:    Prothrombin Time 15.8 (*)    All other components within normal limits  LIPID PANEL - Abnormal; Notable for the following:    HDL 27 (*)    All other components within normal limits  POC OCCULT BLOOD, ED - Abnormal; Notable for the following:    Fecal Occult Bld POSITIVE (*)    All other components within normal limits  MRSA PCR SCREENING  TROPONIN I  TSH  TROPONIN I  TSH  TROPONIN I  TROPONIN I  HEMOGLOBIN A1C  CBC  BASIC METABOLIC PANEL  TYPE AND SCREEN  PREPARE RBC (CROSSMATCH)    Imaging Review Dg Chest 2 View  04/09/2016  CLINICAL DATA:  Centralized chest pain for 1 day. Squeezing sensation, palpitations for 1 day. EXAM: CHEST  2 VIEW COMPARISON:  02/22/2014 FINDINGS: Heart and mediastinal contours are within normal limits. No focal opacities or effusions. No acute bony abnormality. Degenerative changes in the shoulders. IMPRESSION: No active cardiopulmonary disease. Electronically Signed   By: Rolm Baptise M.D.   On: 04/09/2016 15:51   I have personally reviewed and evaluated these images and lab results as part of my medical decision-making.   EKG Interpretation   Date/Time:  Monday April 09 2016 16:36:45 EDT Ventricular Rate:  60 PR Interval:    QRS Duration: 102 QT Interval:  440 QTC Calculation: 440 R Axis:   27 Text Interpretation:  Junctional rhythm Low voltage, precordial leads  Baseline wander in lead(s) V4 No significant change was found Confirmed by   Wyvonnia Dusky  MD, Britanie Harshman [phone]) on 04/09/2016 5:40:06 PM      MDM   Final diagnoses:  Symptomatic anemia  Chest pain, unspecified chest pain type  Melena   Patient presents with intermittent chest pain since this morning. Pain comes and goes lasting several minutes at a time. EKG without acute but does show junctional bradycardia without any visible P waves.  Repeat EKG shows junctional bradycardia with intermittent P waves. Rhythm is regular. Pacer pads applied to patient's. Blood pressure and mental status are stable. Atropine given without effect  Hemoglobin is 6 which is decreased from 11 previously. Stools are melanotic. EGD last year showed gastritis.  Discussed with GI Dr. Rosana Hoes. who agrees with IV Protonix and will see. She is agreeable to blood transfusion.  pRBCS ordered.  IV PPI.  IVF. BP and mental status stable in the ED.  HR 50-60s. Regular.  D/w [facility] residents who will admit to stepdown.   CRITICAL CARE Performed by: Ezequiel Essex Total critical care time: 45 minutes Critical care time was exclusive of separately billable procedures and treating other patients. Critical care was necessary to treat or prevent imminent or life-threatening deterioration. Critical care was time spent personally by me on the following activities: development of treatment plan with patient and/or surrogate as well as nursing, discussions with consultants, evaluation of patient's response to treatment, examination of patient, obtaining history from patient or surrogate, ordering and performing treatments and interventions, ordering and review of laboratory studies, ordering and review of radiographic studies, pulse oximetry and re-evaluation of patient's condition.  Ezequiel Essex, MD 04/10/16 [phone]

## 2016-04-09 NOTE — ED Notes (Signed)
Pt arrives EMS With c/o intermittent chest pain. Pt denies chest pain at present but c/o aching all over.

## 2016-04-09 NOTE — ED Notes (Signed)
Pacemaker at bedside

## 2016-04-10 ENCOUNTER — Encounter (HOSPITAL_COMMUNITY): Payer: Self-pay | Admitting: *Deleted

## 2016-04-10 ENCOUNTER — Other Ambulatory Visit (HOSPITAL_COMMUNITY): Payer: Medicare Other

## 2016-04-10 ENCOUNTER — Encounter (HOSPITAL_COMMUNITY)
Admission: EM | Disposition: A | Discharge status: Home / Self Care | Payer: Self-pay | Source: Home / Self Care | Attending: Family Medicine

## 2016-04-10 DIAGNOSIS — K31811 Angiodysplasia of stomach and duodenum with bleeding: Secondary | ICD-10-CM | POA: Diagnosis not present

## 2016-04-10 DIAGNOSIS — D649 Anemia, unspecified: Secondary | ICD-10-CM | POA: Diagnosis not present

## 2016-04-10 DIAGNOSIS — R079 Chest pain, unspecified: Secondary | ICD-10-CM | POA: Diagnosis not present

## 2016-04-10 DIAGNOSIS — K921 Melena: Principal | ICD-10-CM

## 2016-04-10 DIAGNOSIS — D62 Acute posthemorrhagic anemia: Secondary | ICD-10-CM | POA: Diagnosis not present

## 2016-04-10 DIAGNOSIS — K31819 Angiodysplasia of stomach and duodenum without bleeding: Secondary | ICD-10-CM | POA: Diagnosis not present

## 2016-04-10 DIAGNOSIS — R001 Bradycardia, unspecified: Secondary | ICD-10-CM

## 2016-04-10 HISTORY — PX: ESOPHAGOGASTRODUODENOSCOPY: SHX5428

## 2016-04-10 LAB — BASIC METABOLIC PANEL
ANION GAP: 7 (ref 5–15)
BUN: 53 mg/dL — AB (ref 6–20)
CHLORIDE: 112 mmol/L — AB (ref 101–111)
CO2: 21 mmol/L — ABNORMAL LOW (ref 22–32)
Calcium: 7.9 mg/dL — ABNORMAL LOW (ref 8.9–10.3)
Creatinine, Ser: 1.06 mg/dL — ABNORMAL HIGH (ref 0.44–1.00)
GFR calc Af Amer: 56 mL/min — ABNORMAL LOW (ref >60)
GFR, EST NON AFRICAN AMERICAN: 48 mL/min — AB (ref >60)
GLUCOSE: 114 mg/dL — AB (ref 65–99)
POTASSIUM: 3.9 mmol/L (ref 3.5–5.1)
SODIUM: 140 mmol/L (ref 135–145)

## 2016-04-10 LAB — CBC
HCT: 24.2 % — ABNORMAL LOW (ref 36.0–46.0)
HEMATOCRIT: 24.7 % — AB (ref 36.0–46.0)
HEMOGLOBIN: 8 g/dL — AB (ref 12.0–15.0)
HEMOGLOBIN: 8.1 g/dL — AB (ref 12.0–15.0)
MCH: 28.2 pg (ref 26.0–34.0)
MCH: 28.6 pg (ref 26.0–34.0)
MCHC: 32.4 g/dL (ref 30.0–36.0)
MCHC: 33.5 g/dL (ref 30.0–36.0)
MCV: 87 fL (ref 78.0–100.0)
MCV: 85.5 fL (ref 78.0–100.0)
PLATELETS: 143 10*3/uL — AB (ref 150–400)
Platelets: 153 10*3/uL (ref 150–400)
RBC: 2.84 MIL/uL — ABNORMAL LOW (ref 3.87–5.11)
RBC: 2.83 MIL/uL — AB (ref 3.87–5.11)
RDW: 14.8 % (ref 11.5–15.5)
RDW: 15 % (ref 11.5–15.5)
WBC: 8.2 10*3/uL (ref 4.0–10.5)
WBC: 7.1 10*3/uL (ref 4.0–10.5)

## 2016-04-10 LAB — TROPONIN I
TROPONIN I: 0.04 ng/mL — AB (ref <0.031)
Troponin I: <0.03 ng/mL (ref <0.031)

## 2016-04-10 LAB — HEMOGLOBIN AND HEMATOCRIT, BLOOD
HCT: 25.5 % — ABNORMAL LOW (ref 36.0–46.0)
Hemoglobin: 8.4 g/dL — ABNORMAL LOW (ref 12.0–15.0)

## 2016-04-10 LAB — MRSA PCR SCREENING: MRSA BY PCR: NEGATIVE

## 2016-04-10 SURGERY — EGD (ESOPHAGOGASTRODUODENOSCOPY)
Anesthesia: Moderate Sedation

## 2016-04-10 MED ORDER — MIDAZOLAM HCL 5 MG/ML IJ SOLN
INTRAMUSCULAR | Status: AC
  Filled 2016-04-10: qty 2

## 2016-04-10 MED ORDER — FENTANYL CITRATE (PF) 100 MCG/2ML IJ SOLN
INTRAMUSCULAR | Status: DC | PRN
  Administered 2016-04-10 (×2): 12.5 ug via INTRAVENOUS
  Administered 2016-04-10 (×2): 25 ug via INTRAVENOUS

## 2016-04-10 MED ORDER — FERROUS SULFATE 325 (65 FE) MG PO TABS
325.0000 mg | ORAL_TABLET | Freq: Two times a day (BID) | ORAL | Status: DC
  Administered 2016-04-10 – 2016-04-12 (×5): 325 mg via ORAL
  Filled 2016-04-10 (×4): qty 1

## 2016-04-10 MED ORDER — MIDAZOLAM HCL 10 MG/2ML IJ SOLN
INTRAMUSCULAR | Status: DC | PRN
  Administered 2016-04-10 (×3): 2 mg via INTRAVENOUS
  Administered 2016-04-10 (×2): 1 mg via INTRAVENOUS

## 2016-04-10 MED ORDER — FENTANYL CITRATE (PF) 100 MCG/2ML IJ SOLN
INTRAMUSCULAR | Status: AC
  Filled 2016-04-10: qty 2

## 2016-04-10 MED ORDER — SODIUM CHLORIDE 0.9 % IV SOLN: INTRAVENOUS | Status: DC

## 2016-04-10 MED ORDER — DIPHENHYDRAMINE HCL 50 MG/ML IJ SOLN
INTRAMUSCULAR | Status: AC
  Filled 2016-04-10: qty 1

## 2016-04-10 MED ORDER — HYDROCODONE-ACETAMINOPHEN 10-325 MG PO TABS
1.0000 | ORAL_TABLET | Freq: Three times a day (TID) | ORAL | Status: DC | PRN
  Administered 2016-04-10 – 2016-04-12 (×6): 1 via ORAL
  Filled 2016-04-10 (×6): qty 1

## 2016-04-10 NOTE — Consult Note (Signed)
CARDIOLOGY CONSULT NOTE   Patient ID: ABBIGAL CURLEE MRN: [id number] DOB/AGE: November 27, 1933 82 y.o.  Admit date: 04/09/2016  Primary Physician   MCDIARMID,TODD D, MD Primary Cardiologist   New Reason for Consultation   Bradycardia Requesting Physician  Dr. Lenon Ahmadi  HPI: Nicole Perez is a 82 y.o. female with a history of HTN on Dilt Xr 180mg , 1st degree HB, prior hx of gastritis and H. Pylori and DD who came to [facility] ED for evaluation of chest discomfort and SOB.   No prior cardiac hx. She does not have hearing aid currently, spoken with daughter over phone. She lives by her self and does house hold chores without any chest discomfort of SOB. She is recently noted to be bradycardiac by [facility] nurse and PCP. No evaluation per daughter and patient. For the past two weeks the patient has noted dark stool and exertional SOB. Yesterday she had sudden episode of upper mid sternal chest pain associated with SOB and dizziness. No radiation of pain or diaphoresis?, the patient denies of this, however H&P indicates this. She did noted some LE edema at home, however non specific and complains of more leg/knee pain. The patient denies nausea, vomiting, fever, corthopnea, PND,  syncope, cough, congestion, abdominal pain, hematochezia.   In ED, initial EKG showed junctional rhythm at rate of at rate of 60 bpm, PACs. EKG this morning showed sinus rhythm at rate of 39 bpm. Troponin of  <0.03--><0.03-->0.04. Held CCB. Hgb of 6.7 and + FOBT. S/p 2 PRBC. Now Hgb of 8. For EGD today. BNP of 313.7.    Past Medical History  Diagnosis Date  . Essential hypertension, benign 04/18/2010  . Lichen sclerosus et atrophicus of the vulva   . BACK PAIN, CHRONIC 04/18/2010  . DEGENERATIVE JOINT DISEASE, AXIAL SKELETON 04/21/2010  . DEGENERATIVE JOINT DISEASE, HIPS 09/11/2007  . INSOMNIA, CHRONIC 04/18/2010  . Parotid adenoma 1990, 2012    Right parotid, recurrent parotid pleimorphic adenoma.   Marland Kitchen Spinal stenosis of thoracic region  07/26/2011  . Spinal stenosis of lumbar region 07/26/2011  . Foraminal stenosis of lumbar region 07/26/2011  . Foraminal stenosis of thoracic region 07/26/2011  . Osteoarthritis, multiple sites 08/11/2013  . COLONIC POLYPS, HX OF 12/17/2004    Annotation: polypectomy during colonoscopy in 2006 Qualifier: Diagnosis of  By: McDiarmid MD, Sherren Mocha    . Cystocele 08/11/2013  . Fall     recently x3  . Acute renal failure ([place]) 02/23/2014  . Incontinence 02/09/2014  . Benign parotid tumor, right   . History of pneumonia 07/26/2011  . Rectal fissure 12/16/2013  . Urinary incontinence in female 02/04/2014  . UTI (urinary tract infection) 02/22/2014  . Right shoulder pain 09/30/2012    11/13 Xray Right shoulder: Right glenohumeral joint degenerative changes with osteophyte  formation along the inferior aspect of the glenoid.  Acromioclavicular joint degenerative changes with bony overgrowth.    Cathleen Fears ACETABULAR LABRAL TEAR BY MRI 09/11/2007    Qualifier: Diagnosis of  By: McDiarmid MD, Sherren Mocha    . Hearing loss sensory, bilateral 07/26/2011    Sensorineural Hearing Loss, bilateral, Right greater than Left.  Left ear hearing aid b/c work discrimination in       R. ear is very poor. Audiologist-Stephanie Nance at [facility] in [place].  (05/10/2010)   . Osteoarthritis of both knees 04/22/2013    Discussed use of low dose APAP and up to two tablets of hydrocodone/APA 7.5/325 a day as needed for painful exacerbation of  knee pain.  Patient had 40 mg Solumedrol with 4 ml 1% lidocaine without epi injected into right knee with anterolateral approach after sterile prep.  No complications.      Marland Kitchen HYPERLIPIDEMIA 05/10/2010    Qualifier: Diagnosis of  By: McDiarmid MD, Sherren Mocha    . Incontinence overflow, urine 04/28/2014  . Incomplete emptying of bladder 02/09/2014  . Falls 08/06/2014  . At risk for falls 08/06/2014  . Paresthesia of both feet 08/06/2014  . Orthostatic hypotension 08/06/2014  . Blepharitis 11/16/2014     Diagnosis by optometrist, Renaldo Harrison on exam 11/13/2014  . Dry eye syndrome 11/16/2014  . Glaucoma suspect 11/16/2014  . Blood in stool 12/20/2014  . Cholelithiasis   . Colon polyp   . H. pylori infection   . Hiatal hernia 05/05/2015    Large Hiatal Hernia found on EGD by Dr Hilarie Fredrickson (GI in [place]) in work up of melena and (+) FOBT.  Marland Kitchen Urethral polyp 11/17/2015     Past Surgical History  Procedure Laterality Date  . Total abdominal hysterectomy w/ bilateral salpingoophorectomy  1964    Hysterectomy and bilateral oopherectomy at age 42 for benign reasons  . Bladder suspension      Bladder tack x 2 (Dr Janice Norrie)  . Cystocele repair      Rectal prolapse and cyctocele adter hysterectomy requiring anterior repair Jerilynn Mages Edwinna Areola, MD)  . Rectocele repair      Rectal prolapse and cyctocele adter hysterectomy requiring anterior repair Jerilynn Mages Edwinna Areola, MD)  . Urethral dilation    . Parotid gland tumor excision  1990    S/P excision of Right Parotid Gland Benign Tumor, 1990  . Reconstruction of nose  1994    Nasal bridge reconstruction (Dr Judie Grieve, 1994) for following  Forklift accident on job. Surgery complicated by nerve damage resulting in difficulty raising right eyebrow   . Carpal tunnel release  2010    Carpel Tunnel Release of  left wrist  03/2009 (Dr Fredna Dow): Nerve   . Cataract extraction w/ intraocular lens implant  2010    Dr Charise Killian (ophth)  . Breast lumpectomy      Lumpectomy of benign Breast lumps bilaterally, Dr Bubba Camp  . Laminectomy      S/P L4-5 Laminectomy (1987) for decompression of spinal stenosis  . Colonoscopy w/ polypectomy  2006  . Parotidectomy  08/30/11    Radene Journey, MD (ENT) for recurrent right parotid pleomorphic adenoma by frozen section  . Carpal tunnel release      right wrist    Allergies  Allergen Reactions  . Nitrofurantoin Other (See Comments)    Malaise and profound fatigue     I have reviewed the patient's current medications . sodium chloride    Intravenous Once  . pantoprazole (PROTONIX) IV  80 mg Intravenous Q12H   . sodium chloride    . dextrose 5 % and 0.45% NaCl 1,000 mL infusion 75 mL/hr at 04/09/16 2145   acetaminophen, HYDROcodone-acetaminophen, ondansetron (ZOFRAN) IV  Prior to Admission medications   Medication Sig Start Date End Date Taking? Authorizing Provider  DILT-XR 180 MG 24 hr capsule TAKE 1 CAPSULE(180 MG) BY MOUTH DAILY 04/04/16  Yes Blane Ohara McDiarmid, MD  hydrochlorothiazide (MICROZIDE) 12.5 MG capsule Take 1 capsule (12.5 mg total) by mouth daily. 10/05/15  Yes Blane Ohara McDiarmid, MD  HYDROcodone-acetaminophen (NORCO) 10-325 MG tablet Take 1 tablet by mouth every 12 (twelve) hours as needed for moderate pain or severe pain. 03/12/16  Yes Caren Macadam,  MD  lidocaine (XYLOCAINE) 2 % jelly Apply 1 dime size amount of gel to the area between your vagina and rectum once daily for pain. You may use up on 2 times per day if needed 03/12/16  Yes Caren Macadam, MD  magnesium hydroxide (MILK OF MAGNESIA) 400 MG/5ML suspension Take 30 mLs by mouth daily as needed for mild constipation.   Yes Historical Provider, MD  polyethylene glycol powder (GLYCOLAX/MIRALAX) powder Take 17 g by mouth daily as needed for mild constipation, moderate constipation or severe constipation. 02/14/16  Yes Blane Ohara McDiarmid, MD     Social History   Social History  . Marital Status: Widowed    Spouse Name: N/A  . Number of Children: N/A  . Years of Education: N/A   Occupational History  . retired    Social History Main Topics  . Smoking status: Former Smoker    Quit date: 01/14/1984  . Smokeless tobacco: Never Used  . Alcohol Use: No  . Drug Use: No  . Sexual Activity: No     Comment: hysterectomy   Other Topics Concern  . Not on file   Social History Narrative   Has 8 children   [name], [name], involved in her care   4 brothers, 5 sisters, 3 deceased siblings   Cares for her great-grandchildren during the day    Widowed in 2009   Lives alone.   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              Family Status  Relation Status Death Age  . Mother Deceased   . Father Deceased    Family History  Problem Relation Age of Onset  . Coronary artery disease Mother   . Coronary artery disease Sister     open heart surgery  . Diabetes type II Sister   . Stroke Father 69  . Hypertension Father   . Lupus Brother   . Heart disease Brother   . Heart attack Sister        ROS:  Full 14 point review of systems complete and found to be negative unless listed above.  Physical Exam: Blood pressure 99/55, pulse 45, temperature 98.9 F (37.2 C), temperature source Oral, resp. rate 15, height 4\' 11"  (1.499 m), weight 141 lb 6.4 oz (64.139 kg), last menstrual period 12/17/1962, SpO2 100 %.  General: Well developed, well nourished, female in no acute distress Head: Eyes PERRLA, No xanthomas. Normocephalic and atraumatic, oropharynx without edema or exudate.  Lungs: Resp regular and unlabored, CTA. Heart: RR with slow rate no s3, s4, or murmurs..   Neck: No carotid bruits. No lymphadenopathy.  JVD. Abdomen: Bowel sounds present, abdomen soft and non-tender without masses or hernias noted. Msk:  No spine or cva tenderness. No weakness, no joint deformities or effusions. Extremities: No clubbing, cyanosis or edema. DP/PT/Radials 2+ and equal bilaterally. Neuro: Alert and oriented X 3. No focal deficits noted. Psych:  Good affect, responds appropriately Skin: No rashes or lesions noted.  Labs:   Lab Results  Component Value Date   WBC 7.1 04/10/2016   HGB 8.1* 04/10/2016   HCT 24.2* 04/10/2016   MCV 85.5 04/10/2016   PLT 143* 04/10/2016    Recent Labs  04/09/16  1831  INR 1.25    Recent Labs Lab 04/09/16 1646 04/10/16 0302    NA 141 140  K 3.5 3.9  CL 111 112*  CO2 19* 21*  BUN 53* 53*  CREATININE 1.01* 1.06*  CALCIUM 8.3* 7.9*  PROT 5.5*  --   BILITOT 0.2*  --   ALKPHOS 39  --   ALT 11*  --   AST 17  --   GLUCOSE 117* 114*  ALBUMIN 3.1*  --    No results found for: MG  Recent Labs  04/09/16 1646 04/09/16 2145 04/10/16 0302 04/10/16 0907  TROPONINI <0.03 <0.03 <0.03 0.04*   No results for input(s): TROPIPOC in the last 72 hours. No results found for: PROBNP Lab Results  Component Value Date   CHOL 108 04/09/2016   HDL 27* 04/09/2016   LDLCALC 59 04/09/2016   TRIG 112 04/09/2016     ECG:  Vent. rate 39 BPM PR interval 158 ms QRS duration 82 ms QT/QTc 514/413 ms P-R-T axes -2 10 0  Radiology:  Dg Chest 2 View  04/09/2016  CLINICAL DATA:  Centralized chest pain for 1 day. Squeezing sensation, palpitations for 1 day. EXAM: CHEST  2 VIEW COMPARISON:  02/22/2014 FINDINGS: Heart and mediastinal contours are within normal limits. No focal opacities or effusions. No acute bony abnormality. Degenerative changes in the shoulders. IMPRESSION: No active cardiopulmonary disease. Electronically Signed   By: Rolm Baptise M.D.   On: 04/09/2016 15:51    ASSESSMENT AND PLAN:    1. Bradycardia - Junctional rhythm on presentation. Held home dilt-xr. Repeat EKG this morning showed sinus rhythm at rate of 39 bpm. Telemetry showed rate of 50-60s with intermittent rate of high 30s.  - Avoid AV blocking agent. Further recommendation pending echocardiogram. She is low risk for cardiac complication during EGD.   2. Chest discomfort - Likely due to anemia. Troponin <0.03--><0.03-->0.04. Pending echo.   3. Symptomatic bradycardia - s/p 2 PRBC. Pending EGD.   SignedLeanor Kail, [place] 04/10/2016, 1:55 PM Pager [phone]  Co-Sign MD

## 2016-04-10 NOTE — Op Note (Signed)
[facility] Patient Name: Nicole Perez Procedure Date : 04/10/2016 MRN: [id number] Attending MD: Docia Chuck. Henrene Pastor , MD Date of Birth: [birth year] CSN: [id number] Age: 82 Admit Type: Inpatient Procedure:                Upper GI endoscopy Indications:              Melena ; Similar problem proximal and one year ago                            for which she underwent colonoscopy and upper                            endoscopywith Dr. Hilarie Fredrickson Providers:                Docia Chuck. Henrene Pastor, MD, Dortha Schwalbe, RN, Elspeth Cho, Technician Referring MD:             [facility] Medicines:                Monitored Anesthesia Care Complications:            No immediate complications. Estimated Blood Loss:     Estimated blood loss: none. Procedure:                Pre-Anesthesia Assessment:                           - Prior to the procedure, a History and Physical                            was performed, and patient medications and                            allergies were reviewed. The patient's tolerance of                            previous anesthesia was also reviewed. The risks                            and benefits of the procedure and the sedation                            options and risks were discussed with the patient.                            All questions were answered, and informed consent                            was obtained. Prior Anticoagulants: The patient has                            taken no previous anticoagulant or antiplatelet  agents. ASA Grade Assessment: II - A patient with                            mild systemic disease. After reviewing the risks                            and benefits, the patient was deemed in                            satisfactory condition to undergo the procedure.                           After obtaining informed consent, the endoscope was   passed under direct vision. Throughout the                            procedure, the patient's blood pressure, pulse, and                            oxygen saturations were monitored continuously. The                            [id number]) scope was introduced through the                            mouth, and advanced to the third part of duodenum.                            The upper GI endoscopy was accomplished without                            difficulty. The patient tolerated the procedure                            well. Scope In: Scope Out: Findings:      The examined esophagus was normal.      The entire examined stomach was normal.      The duodenal bulb was normal.      Four 2 mm angiodysplastic lesions without bleeding were found in the       second portion of the duodenum. Coagulation for hemostasis using argon       plasma was successful. Estimated blood loss: none.      Two 2 mm angiodysplastic lesions without bleeding were found in the       third portion of the duodenum. Coagulation for hemostasis using argon       plasma was successful. Estimated blood loss: none.      The cardia and gastric fundus were normal on retroflexion. Impression:               - Normal esophagus.                           - Normal stomach.                           - Normal duodenal bulb.                           -  Four non-bleeding angiodysplastic lesions in the                            duodenum. Treated with argon plasma coagulation                            (APC).                           - Two non-bleeding angiodysplastic lesions in the                            duodenum. Treated with argon plasma coagulation                            (APC).                           - ANGIODYSPLASIA AS THE CAUSE FOR HER RECURRENT                            MELENA Moderate Sedation:      none Recommendation:           - 1. Resume diet                           2. Recommend iron sulfate  325 mg twice daily                            indefinitely                           3. If patient were to develop recurrent anemia in                            the futureor recurrent bleeding, recommend upper                            endoscopy with push enteroscopy and ablation                           4. If stable overnight, can go home                            tomorrow.Discussed with patient and daughter. Procedure Code(s):        --- Professional ---                           [id number], Esophagogastroduodenoscopy, flexible,                            transoral; with control of bleeding, any method Diagnosis Code(s):        --- Professional ---                           [id number], Angiodysplasia of stomach and duodenum  without bleeding                           K92.1, Melena (includes Hematochezia) CPT copyright 2016 American Medical Association. All rights reserved. The codes documented in this report are preliminary and upon coder review may  be revised to meet current compliance requirements. Docia Chuck. Henrene Pastor, MD 04/10/2016 4:04:20 PM This report has been signed electronically. Number of Addenda: 0

## 2016-04-10 NOTE — Progress Notes (Signed)
Patient is a self cath 3-4 times a day when needed. Patient does not void on her own. Patient has a one time order for in and out cath . On call MD paged for order for prn cath . [facility] [employer]

## 2016-04-10 NOTE — Progress Notes (Signed)
Family Medicine Teaching Service Daily Progress Note Intern Pager: [phone]  Patient name: Nicole Perez Medical record number: [id number] Date of birth: [birth year] Age: 82 y.o. Gender: female  Primary Care Provider: MCDIARMID,TODD D, MD Consultants: GI and cardiology Code Status: DNR (obtained on admission)  Pt Overview and Major Events to Date:  4/24: admitted with symptomatic anemia  Assessment and Plan: Nicole Perez is a 82 y.o. female presenting with chest pain and GI bleed. PMH is significant for hypertension, neurogenic bladder and OA  Symptomatic anemia in the setting of UGIB: improved. Hgb 6.7. B/l 11 as late as 09/2015. Hgb 8 after 2 units. -Two bore IV lines in place - Typed and screened on4/24 - CBC at 2 pm - f/u GI recs - Protonix 80 mg IV twice a day  - Hold antihypertensive meds - NPO with ice chips - D5-1/2NS@75  while NPO  Chest pain: resolved. exertional and retrosternal. HEART score 4. But ACS unlikely with negative troponin x3 and  EKG in ED with junctional rhythm and unremarkable ST-T (EKG in the past with 1 deg AVB). EKG this morning with marked bradycardia.  BNP elevated to 313 but no sign of fluid overload on exam.  No orthopnea or crackles.  - f/u Card recs - Cardiac monitor - Judicious IVF's while NPO - Lipid panel, Hb A1c, TSH (nl) - Nitroglycerin 0.4mg  SL q7min prn chest pain  - O2 by [place] prn hypoxemia - Echocardiogram given dyspnea, SEM and elevated BNP - Avoiding ASA in absence of definitive evidence of ischemia  Hypertension: hypotensive to 99/55 this morning. Hypotensive to 80's/50's on arrival to ED. On Dilt and HCTZ at home -Hold BP meds -Monitor BP -IV as above  Bradycardia: patient with junctional rhythm on admission. Bradycardic to 40's overnight. In 70's this morning on monitor. -follow card recs  Osteoarthritis: history of back & LE OA. On Lorcet at home -Continue home Lorcet three times a day as needed  - Avoid  NSAIDs  Neurogenic bladder: she does self cath at home. No dysuria or sign of infection.  -I&O cath: UOP 1.1L  Over 12 hrs.  FEN/GI: -NPO with chips -IVF as above -Protonix as above  PPX: SCD given GI bleed  Disposition: admit to SDU for GI bleed and ACS rule out  Disposition: continue SDU   Subjective:  Reports not sleeping well due to leg pain. Denies chest pain, dyspnea or abdominal pain. No BM here yet. Denies dizziness.   Objective: Temp:  [97.9 F (36.6 C)-98.7 F (37.1 C)] 98.5 F (36.9 C) (04/25 0437) Pulse Rate:  [40-66] 40 (04/25 0437) Resp:  [12-22] 15 (04/25 0437) BP: (88-128)/(37-70) 127/53 mmHg (04/25 0437) SpO2:  [99 %-100 %] 99 % (04/25 0437) Weight:  [128 lb 12.8 oz (58.423 kg)-141 lb 6.4 oz (64.139 kg)] 141 lb 6.4 oz (64.139 kg) (04/25 0450) Physical Exam: GEN: pleasant, pale, sitting in bed, appears well, NAD Eye: conjunctivae pale, PERL, arcus senilis CVS: irregular bradycardia, S2>S1, ?SEM, no edema, DP pulse hard to palpate RESP: no increased work of breathing, good air movement bilaterally, no crackles or wheeze GI: +BS, soft, mild tender over RLQ, non-distended, no mass GU: no suprapubic tenderness NEURO: alert, awake and oriented, no gross defecits   Laboratory:  Recent Labs Lab 04/09/16 1646  WBC 7.5  HGB 6.7*  HCT 20.9*  PLT 203    Recent Labs Lab 04/09/16 1646 04/10/16 0302  NA 141 140  K 3.5 3.9  CL 111 112*  CO2 19*  21*  BUN 53* 53*  CREATININE 1.01* 1.06*  CALCIUM 8.3* 7.9*  PROT 5.5*  --   BILITOT 0.2*  --   ALKPHOS 39  --   ALT 11*  --   AST 17  --   GLUCOSE 117* 114*    Imaging/Diagnostic Tests: Dg Chest 2 View  04/09/2016  CLINICAL DATA:  Centralized chest pain for 1 day. Squeezing sensation, palpitations for 1 day. EXAM: CHEST  2 VIEW COMPARISON:  02/22/2014 FINDINGS: Heart and mediastinal contours are within normal limits. No focal opacities or effusions. No acute bony abnormality. Degenerative changes in  the shoulders. IMPRESSION: No active cardiopulmonary disease. Electronically Signed   By: Rolm Baptise M.D.   On: 04/09/2016 15:51    Mercy Riding, MD 04/10/2016, 7:30 AM PGY-1, [place] Intern pager: [phone], text pages welcome

## 2016-04-10 NOTE — Care Management Note (Addendum)
Case Management Note  Patient Details  Name: Nicole Perez MRN: [id number] Date of Birth: September 07, 1933  Subjective/Objective:         Pt in for anemia. Pt is from home alone and has support of daughter. Pt states she uses a RW for DME needs. Pt states she has transportation to MD appointments via family.            Action/Plan: No needs identified at this time. CM will continue to monitor for additional needs.    Expected Discharge Date:                  Expected Discharge Plan:  Home/Self Care  In-House Referral:  NA  Discharge planning Services  CM Consult  Post Acute Care Choice:   Home Health Services Choice offered to:   Patient/ Children  DME Arranged:   N/A DME Agency:   N/A  HH Arranged:   PT HH Agency:   [facility]  Status of Service:  Completed.  Medicare Important Message Given:    Date Medicare IM Given:    Medicare IM give by:    Date Additional Medicare IM Given:    Additional Medicare Important Message give by:     If discussed at [place] of Stay Meetings, dates discussed:    Additional Comments: 1017 04-13-16 [name], RN,BSN [phone] Late Entry: Pt was d/c on 4-27 and needs [facility] PT services. CM did call pt and she had used [facility] in the past and wanted to use them again. CM did make referral and SOC to begin within 24-48 hours of d/c. CM did make referral to [place] No further needs from CM at this time.   [name], RN 04/10/2016, 2:32 PM

## 2016-04-10 NOTE — Care Management Obs Status (Signed)
[place] NOTIFICATION   Patient Details  Name: Nicole Perez MRN: [id number] Date of Birth: 1933-01-22   Medicare Observation Status Notification Given:  Yes    [name], RN 04/10/2016, 10:28 AM

## 2016-04-10 NOTE — Consult Note (Signed)
[facility] Consult: 8:54 AM 04/10/2016  LOS: 1 day    Referring Provider: Dr Erin Hearing  Primary Care Physician:  MCDIARMID,TODD D, MD Primary Gastroenterologist:  Dr. Hilarie Fredrickson     Reason for Consultation:  GI bleed.    HPI: Nicole Perez is a 82 y.o. female. Hx UTIs and neurogenic bladder, self caths at home.  Osteoarthritis.  Spinal stenosis.  Chronic back pain.  Obesity.  HTN.  Glaucoma.  08/2011 Parotid gland bx: pleomorphic adenoma.   Hx colon polyps and rectal fissure. Hiatal hernia.  03/2015 Colonoscopy.  Dr Hilarie Fredrickson.  INDICATIONS:heme positive stool, constipation, melena, history of hyperplastic colon polyps, rectal prolapse, last colonoscopy 2006. ENDOSCOPIC IMPRESSION: 1. Eight sessile polyps ranging between 3-87mm in size were found in the ascending colon, transverse colon, and descending colon; polypectomies were performed with a cold snare 2. Multiple sessile polyps were found in the rectosigmoid colon 3. Mild diverticulosis was noted in the transverse colon, descending colon, and sigmoid colon Path: tubular adenomas and sessile serrated adenomas.  03/2015 EGD INDICATIONS: melena and heme positive stool. ENDOSCOPIC IMPRESSION: 1. The mucosa of the esophagus appeared normal 2. Large hiatal hernia 3. Erosive gastritis (inflammation) was found in the entire examined stomach; multiple biopsies were performed 4. The duodenal mucosa showed no abnormalities in the bulb and 2nd part of the duodenum Path: + for H Pylori gastritis.  Rx'd with Prevpac vs pylera equivalents. .  02/2005 Colonoscopy.  Dr Mellody Memos.  Hyperplastic polyps descending colon.  1998 Colonoscopy.  Dr Wynetta Emery.  Scattered small polyps in rectum and sigmoid.   Dark stools intermittently for 2 weeks  Pt presented to ED  with chest pain, progressive dyspnea and weakness over last few days.  Presyncope in last 24 hours.  Hgb 6.7 (8.0 after PRBC x 2), BUN 53.  Protonix drip initiated.  Troponins normal x 3.   88/52, pulse 51 at arrival.   Currently 116-128/41-59.  Pulse in mid to upper 40s  Has not taken PPI for several months.  Did complete H Pylori triple therapy.  Was taking 200 mg Ibuprofen daily to stretch her less effective hydrocodone.  Stopped this at advice of MD 3 weeks ago.  Took 1 Aleve on Sunday.  Pain is in Right lower leg/foot and sometimes from the mid back to her feet.   Appetite diminished for several weeks.  No n/v.  No heartburn.  No dysphagia.  No BPR.   No ETOH    Past Medical History  Diagnosis Date  . Essential hypertension, benign 04/18/2010  . Lichen sclerosus et atrophicus of the vulva   . BACK PAIN, CHRONIC 04/18/2010  . DEGENERATIVE JOINT DISEASE, AXIAL SKELETON 04/21/2010  . DEGENERATIVE JOINT DISEASE, HIPS 09/11/2007  . INSOMNIA, CHRONIC 04/18/2010  . Parotid adenoma 1990, 2012    Right parotid, recurrent parotid pleimorphic adenoma.   Marland Kitchen Spinal stenosis of thoracic region 07/26/2011  . Spinal stenosis of lumbar region 07/26/2011  . Foraminal stenosis of lumbar region 07/26/2011  . Foraminal stenosis of thoracic region 07/26/2011  . Osteoarthritis, multiple sites 08/11/2013  .  COLONIC POLYPS, HX OF 12/17/2004    Annotation: polypectomy during colonoscopy in 2006 Qualifier: Diagnosis of  By: McDiarmid MD, Sherren Mocha    . Cystocele 08/11/2013  . Fall     recently x3  . Acute renal failure ([place]) 02/23/2014  . Incontinence 02/09/2014  . Benign parotid tumor, right   . History of pneumonia 07/26/2011  . Rectal fissure 12/16/2013  . Urinary incontinence in female 02/04/2014  . UTI (urinary tract infection) 02/22/2014  . Right shoulder pain 09/30/2012    11/13 Xray Right shoulder: Right glenohumeral joint degenerative changes with osteophyte  formation along the inferior aspect of the glenoid.   Acromioclavicular joint degenerative changes with bony overgrowth.    Cathleen Fears ACETABULAR LABRAL TEAR BY MRI 09/11/2007    Qualifier: Diagnosis of  By: McDiarmid MD, Sherren Mocha    . Hearing loss sensory, bilateral 07/26/2011    Sensorineural Hearing Loss, bilateral, Right greater than Left.  Left ear hearing aid b/c work discrimination in       R. ear is very poor. Audiologist-Stephanie Nance at [facility] in [place].  (05/10/2010)   . Osteoarthritis of both knees 04/22/2013    Discussed use of low dose APAP and up to two tablets of hydrocodone/APA 7.5/325 a day as needed for painful exacerbation of knee pain.  Patient had 40 mg Solumedrol with 4 ml 1% lidocaine without epi injected into right knee with anterolateral approach after sterile prep.  No complications.      Marland Kitchen HYPERLIPIDEMIA 05/10/2010    Qualifier: Diagnosis of  By: McDiarmid MD, Sherren Mocha    . Incontinence overflow, urine 04/28/2014  . Incomplete emptying of bladder 02/09/2014  . Falls 08/06/2014  . At risk for falls 08/06/2014  . Paresthesia of both feet 08/06/2014  . Orthostatic hypotension 08/06/2014  . Blepharitis 11/16/2014    Diagnosis by optometrist, Renaldo Harrison on exam 11/13/2014  . Dry eye syndrome 11/16/2014  . Glaucoma suspect 11/16/2014  . Blood in stool 12/20/2014  . Cholelithiasis   . Colon polyp   . H. pylori infection   . Hiatal hernia 05/05/2015    Large Hiatal Hernia found on EGD by Dr Hilarie Fredrickson (GI in [place]) in work up of melena and (+) FOBT.  Marland Kitchen Urethral polyp 11/17/2015    Past Surgical History  Procedure Laterality Date  . Total abdominal hysterectomy w/ bilateral salpingoophorectomy  1964    Hysterectomy and bilateral oopherectomy at age 69 for benign reasons  . Bladder suspension      Bladder tack x 2 (Dr Janice Norrie)  . Cystocele repair      Rectal prolapse and cyctocele adter hysterectomy requiring anterior repair Jerilynn Mages Edwinna Areola, MD)  . Rectocele repair      Rectal prolapse and cyctocele adter hysterectomy  requiring anterior repair Jerilynn Mages Edwinna Areola, MD)  . Urethral dilation    . Parotid gland tumor excision  1990    S/P excision of Right Parotid Gland Benign Tumor, 1990  . Reconstruction of nose  1994    Nasal bridge reconstruction (Dr Judie Grieve, 1994) for following  Forklift accident on job. Surgery complicated by nerve damage resulting in difficulty raising right eyebrow   . Carpal tunnel release  2010    Carpel Tunnel Release of  left wrist  03/2009 (Dr Fredna Dow): Nerve   . Cataract extraction w/ intraocular lens implant  2010    Dr Charise Killian (ophth)  . Breast lumpectomy      Lumpectomy of benign Breast lumps bilaterally, Dr Bubba Camp  . Laminectomy  S/P L4-5 Laminectomy (1987) for decompression of spinal stenosis  . Colonoscopy w/ polypectomy  2006  . Parotidectomy  08/30/11    Radene Journey, MD (ENT) for recurrent right parotid pleomorphic adenoma by frozen section  . Carpal tunnel release      right wrist    Prior to Admission medications   Medication Sig Start Date End Date Taking? Authorizing Provider  DILT-XR 180 MG 24 hr capsule TAKE 1 CAPSULE(180 MG) BY MOUTH DAILY 04/04/16  Yes Blane Ohara McDiarmid, MD  hydrochlorothiazide (MICROZIDE) 12.5 MG capsule Take 1 capsule (12.5 mg total) by mouth daily. 10/05/15  Yes Blane Ohara McDiarmid, MD  HYDROcodone-acetaminophen (NORCO) 10-325 MG tablet Take 1 tablet by mouth every 12 (twelve) hours as needed for moderate pain or severe pain. 03/12/16  Yes Caren Macadam, MD  lidocaine (XYLOCAINE) 2 % jelly Apply 1 dime size amount of gel to the area between your vagina and rectum once daily for pain. You may use up on 2 times per day if needed 03/12/16  Yes Caren Macadam, MD  magnesium hydroxide (MILK OF MAGNESIA) 400 MG/5ML suspension Take 30 mLs by mouth daily as needed for mild constipation.   Yes Historical Provider, MD  polyethylene glycol powder (GLYCOLAX/MIRALAX) powder Take 17 g by mouth daily as needed for mild constipation, moderate  constipation or severe constipation. 02/14/16  Yes Blane Ohara McDiarmid, MD    Scheduled Meds: . sodium chloride   Intravenous Once  . pantoprazole (PROTONIX) IV  80 mg Intravenous Q12H   Infusions: . dextrose 5 % and 0.45% NaCl 1,000 mL infusion 75 mL/hr at 04/09/16 2145   PRN Meds: acetaminophen, HYDROcodone-acetaminophen, ondansetron (ZOFRAN) IV   Allergies as of 04/09/2016 - Review Complete 04/09/2016  Allergen Reaction Noted  . Nitrofurantoin Other (See Comments) 10/05/2015    Family History  Problem Relation Age of Onset  . Coronary artery disease Mother   . Coronary artery disease Sister     open heart surgery  . Diabetes type II Sister   . Stroke Father 86  . Hypertension Father   . Lupus Brother   . Heart disease Brother   . Heart attack Sister     Social History   Social History  . Marital Status: Widowed    Spouse Name: N/A  . Number of Children: N/A  . Years of Education: N/A   Occupational History  . retired    Social History Main Topics  . Smoking status: Former Smoker    Quit date: 01/14/1984  . Smokeless tobacco: Never Used  . Alcohol Use: No  . Drug Use: No  . Sexual Activity: No     Comment: hysterectomy   Other Topics Concern  . Not on file   Social History Narrative   Has 8 children   [name], [name], involved in her care   4 brothers, 5 sisters, 3 deceased siblings   Cares for her great-grandchildren during the day   Widowed in 2009   Lives alone.   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              REVIEW OF SYSTEMS: Constitutional:  Per HPI ENT:  No nose bleeds.  Wears hearing aid.  Pulm:  Per HPI.  No cough CV:  No palpitations, no LE edema. Chest pain resolved GU: normally avoids coffee but drank several cups 11 days ago and this set off her bladder:  irritated, burning discomfort, unable to sleep.  No blood in urine.  GI:  No dysphagia.   Heme:  Per HPI.  No unusual bleeding or bruising.     Transfusions:  None ever before MS:  Body pain as per HPI Neuro:  No headaches, no peripheral tingling or numbness Derm:  No itching, no rash or sores.  Endocrine:  No sweats or chills.  No polyuria or dysuria Immunization:  reviewed Travel:  None beyond local counties in last few months.    PHYSICAL EXAM: Vital signs in last 24 hours: Filed Vitals:   04/10/16 0437 04/10/16 0754  BP: 127/53 99/55  Pulse: 40 45  Temp: 98.5 F (36.9 C) 98.9 F (37.2 C)  Resp: 15    Wt Readings from Last 3 Encounters:  04/10/16 64.139 kg (141 lb 6.4 oz)  03/12/16 62.143 kg (137 lb)  11/17/15 61.746 kg (136 lb 2 oz)    General: Delightful, alert, comfortable.  Looks well Head:  No trauma, no swelling or asymmetry  Eyes:  + conj pallor.  EOMI.   Ears:  Hearing aid in place.    Nose:  No discharge or congestion Mouth:  Moist, clear.  Upper partial/bridge.  Lower has only a few incisors.  MM pink, clear and moist Neck:  No JVD, no TMG, no bruits Lungs:  Clear bil.  No cough or dyspnea Heart: RRR.  No mrg.  S1/s2 present Abdomen:  Soft, NT.  No masses, HSM, bruits, or hernias.   Rectal: deferred.  FOBT + dark stool in ED   Musc/Skeltl: no joint deformity, swelling Extremities:  No CCE  Neurologic:  Oriented x 3.  No gross deficits.  Moves all 4s.  Limb strength not tested.  Skin:  No rash or sores Tattoos:  none   Psych:  Pleasant.  Cooperative.  Calm, cooperative.   Intake/Output from previous day: 04/24 0701 - 04/25 0700 In: 1713.8 [I.V.:1043.8; Blood:670] Out: 1100 [Urine:1100] Intake/Output this shift:    LAB RESULTS:  Recent Labs  04/09/16 1646 04/10/16 0729  WBC 7.5 8.2  HGB 6.7* 8.0*  HCT 20.9* 24.7*  PLT 203 153   BMET Lab Results  Component Value Date   NA 140 04/10/2016   NA 141 04/09/2016   NA 135 10/05/2015   K 3.9  04/10/2016   K 3.5 04/09/2016   K 4.4 10/05/2015   CL 112* 04/10/2016   CL 111 04/09/2016   CL 102 10/05/2015   CO2 21* 04/10/2016   CO2 19* 04/09/2016   CO2 24 10/05/2015   GLUCOSE 114* 04/10/2016   GLUCOSE 117* 04/09/2016   GLUCOSE 84 10/05/2015   BUN 53* 04/10/2016   BUN 53* 04/09/2016   BUN 25 10/05/2015   CREATININE 1.06* 04/10/2016   CREATININE 1.01* 04/09/2016   CREATININE 1.14* 10/05/2015   CALCIUM 7.9* 04/10/2016   CALCIUM 8.3* 04/09/2016   CALCIUM 9.3 10/05/2015   LFT  Recent Labs  04/09/16 1646  PROT 5.5*  ALBUMIN 3.1*  AST 17  ALT 11*  ALKPHOS 39  BILITOT 0.2*   PT/INR Lab Results  Component Value Date   INR 1.25 04/09/2016   INR 1.01 12/18/2014    RADIOLOGY STUDIES: Dg Chest 2 View  04/09/2016  CLINICAL DATA:  Centralized chest pain for 1 day. Squeezing sensation, palpitations for 1 day.  EXAM: CHEST  2 VIEW COMPARISON:  02/22/2014 FINDINGS: Heart and mediastinal contours are within normal limits. No focal opacities or effusions. No acute bony abnormality. Degenerative changes in the shoulders. IMPRESSION: No active cardiopulmonary disease. Electronically Signed   By: Rolm Baptise M.D.   On: 04/09/2016 15:51    ENDOSCOPIC STUDIES: Per HPI  IMPRESSION:   *  Upper GI bleed.  Rule out :ulcer vs gastritis/duodenitis vs AVMs vs [place] erosions.  Not so worried about neoplasia given none on 03/2015 EGD.   On PPI drip  *  Blood loss anemia.  Normocytic. Improved post 2 PRBCs.   *  Bradycardia, sinus.  Tolerating this well. Not hypotensive.     PLAN:     *  EGD, today soonest will be ~ 230 PM.  D/w pt and her dtr.    Azucena Freed  04/10/2016, 8:54 AM Pager: [phone]  GI ATTENDING  History, laboratories, x-rays, prior endoscopy reports reviewed. Patient seen and examined. Cardiology note reviewed and case discussed with Dr. Debara Pickett. Elderly female who presents with subacute GI bleeding (upper).Had workup for similar complaints last year Plan  urgent endoscopy today as she has been cleared by cardiology.She is higher than baseline risk.The nature of the procedure, as well as the risks, benefits, and alternatives were carefully and thoroughly reviewed with the patient. Ample time for discussion and questions allowed. The patient understood, was satisfied, and agreed to proceed.. If EGD unremarkable, would proceed with capsule endoscopy  Docia Chuck. Geri Seminole., M.D. [facility] Division of Gastroenterology

## 2016-04-11 ENCOUNTER — Observation Stay (HOSPITAL_BASED_OUTPATIENT_CLINIC_OR_DEPARTMENT_OTHER): Payer: Medicare Other

## 2016-04-11 ENCOUNTER — Other Ambulatory Visit: Payer: Self-pay | Admitting: Physician Assistant

## 2016-04-11 ENCOUNTER — Encounter (HOSPITAL_COMMUNITY): Payer: Self-pay | Admitting: Internal Medicine

## 2016-04-11 DIAGNOSIS — K31811 Angiodysplasia of stomach and duodenum with bleeding: Secondary | ICD-10-CM | POA: Diagnosis not present

## 2016-04-11 DIAGNOSIS — D62 Acute posthemorrhagic anemia: Secondary | ICD-10-CM | POA: Diagnosis not present

## 2016-04-11 DIAGNOSIS — R079 Chest pain, unspecified: Secondary | ICD-10-CM | POA: Diagnosis not present

## 2016-04-11 DIAGNOSIS — R001 Bradycardia, unspecified: Secondary | ICD-10-CM | POA: Diagnosis not present

## 2016-04-11 DIAGNOSIS — D649 Anemia, unspecified: Secondary | ICD-10-CM | POA: Diagnosis not present

## 2016-04-11 LAB — CBC
HCT: 22.1 % — ABNORMAL LOW (ref 36.0–46.0)
Hemoglobin: 7.2 g/dL — ABNORMAL LOW (ref 12.0–15.0)
MCH: 28.7 pg (ref 26.0–34.0)
MCHC: 32.6 g/dL (ref 30.0–36.0)
MCV: 88 fL (ref 78.0–100.0)
PLATELETS: 158 10*3/uL (ref 150–400)
RBC: 2.51 MIL/uL — AB (ref 3.87–5.11)
RDW: 15.9 % — ABNORMAL HIGH (ref 11.5–15.5)
WBC: 7.4 10*3/uL (ref 4.0–10.5)

## 2016-04-11 LAB — HEMOGLOBIN AND HEMATOCRIT, BLOOD
HCT: 21.9 % — ABNORMAL LOW (ref 36.0–46.0)
Hemoglobin: 7.1 g/dL — ABNORMAL LOW (ref 12.0–15.0)

## 2016-04-11 LAB — HEMOGLOBIN A1C
Hgb A1c MFr Bld: 6.3 % — ABNORMAL HIGH (ref 4.8–5.6)
Mean Plasma Glucose: 134 mg/dL

## 2016-04-11 LAB — PREPARE RBC (CROSSMATCH)

## 2016-04-11 LAB — ECHOCARDIOGRAM COMPLETE
HEIGHTINCHES: 59 in
Weight: 2278.4 oz

## 2016-04-11 MED ORDER — PANTOPRAZOLE SODIUM 40 MG PO TBEC
40.0000 mg | DELAYED_RELEASE_TABLET | Freq: Every day | ORAL | Status: DC
  Administered 2016-04-12: 40 mg via ORAL
  Filled 2016-04-11: qty 1

## 2016-04-11 MED ORDER — POLYETHYLENE GLYCOL 3350 17 G PO PACK
17.0000 g | PACK | Freq: Every day | ORAL | Status: DC
  Administered 2016-04-11 – 2016-04-12 (×2): 17 g via ORAL
  Filled 2016-04-11 (×2): qty 1

## 2016-04-11 MED ORDER — SODIUM CHLORIDE 0.9 % IV SOLN
80.0000 mg | Freq: Two times a day (BID) | INTRAVENOUS | Status: DC
  Filled 2016-04-11 (×2): qty 80

## 2016-04-11 MED ORDER — LISINOPRIL 5 MG PO TABS: 5.0000 mg | ORAL_TABLET | Freq: Every day | ORAL | Status: DC

## 2016-04-11 MED ORDER — LISINOPRIL 5 MG PO TABS
5.0000 mg | ORAL_TABLET | Freq: Every day | ORAL | Status: DC
  Administered 2016-04-11: 5 mg via ORAL
  Filled 2016-04-11: qty 1

## 2016-04-11 MED ORDER — MAGNESIUM HYDROXIDE 400 MG/5ML PO SUSP
15.0000 mL | Freq: Once | ORAL | Status: AC
  Administered 2016-04-11: 15 mL via ORAL
  Filled 2016-04-11: qty 30

## 2016-04-11 MED ORDER — SODIUM CHLORIDE 0.9 % IV SOLN
Freq: Once | INTRAVENOUS | Status: AC
  Administered 2016-04-11: 18:00:00 via INTRAVENOUS

## 2016-04-11 MED ORDER — SODIUM CHLORIDE 0.45 % IV SOLN
INTRAVENOUS | Status: DC
  Administered 2016-04-11: 01:00:00 via INTRAVENOUS

## 2016-04-11 MED ORDER — FERROUS SULFATE 325 (65 FE) MG PO TABS: 325.0000 mg | ORAL_TABLET | Freq: Two times a day (BID) | ORAL | Status: DC

## 2016-04-11 NOTE — Progress Notes (Signed)
Family Medicine Teaching Service Daily Progress Note Intern Pager: [phone]  Patient name: Nicole Perez Medical record number: [id number] Date of birth: 30-Nov-1933 Age: 82 y.o. Gender: female  Primary Care Provider: MCDIARMID,TODD D, MD Consultants: GI and cardiology Code Status: DNR (obtained on admission)  Pt Overview and Major Events to Date:  4/24: admitted with symptomatic anemia  Assessment and Plan: Nicole Perez is a 82 y.o. female presenting with chest pain and GI bleed. PMH is significant for hypertension, neurogenic bladder and OA  Symptomatic anemia in the setting of UGIB: improved. Hgb 6.7.  B/l 11 as late as 09/2015. Hgb 8 after 2 units but trended down to 7.1 this morning - Two bore IV lines in place - Typed and screened on4/24 -H&H this afternoon -GI recs  -Angiodysplasia likely cause of her UGIB  -EGD: with 6 82mm angiodysplastic lesions s/p argon plasma coagulation  -Upper upper endoscopy with push enteroscopy if continues to bleed  -Ferrous sulfate indefinitely - Protonix 40 mg once daily - Hold antihypertensive meds  Chest pain: resolved. ACS ruled out. BNP elevated to 313 but no sign of fluid overload on exam.  No orthopnea or crackles.  - Card recs  -hold diltiazem  -echo: ordered 4/24  -stress test - Nitroglycerin 0.4mg  SL q32min prn chest pain  - O2 by [place] prn hypoxemia - Avoiding ASA in absence of definitive evidence of ischemia  Hypertension: normotensive. Hypotensive to 80's/50's on arrival to ED. On Dilt and HCTZ at home -Holding home meds -Monitor BP -IV as above  Bradycardia: resolved. EKG with junctional rhythm on admission. Bradycardic to 40's overnight. In 70's this morning on monitor. -follow card recs  Osteoarthritis: history of back & LE OA. On Lorcet at home -Continue home Lorcet three times a day as needed.  - Avoid NSAIDs  Neurogenic bladder: she does self cath at home. No dysuria or sign of infection.  -I&O cath: UOP 1.05 L   Over 24hrs.  Constipation: no BM since admission -Miralax today  FEN/GI: -soft diet -D/c fluids. Has good PO  -Protonix as above -Miralax daily  PPX: SCD given GI bleed  Disposition: SDU given GI bleed  Subjective:  No complaints this morning. Denies chest pain, shortness of breath and abdominal pain. No BM yet.   Objective: Temp:  [98.1 F (36.7 C)-98.9 F (37.2 C)] 98.2 F (36.8 C) (04/26 0429) Pulse Rate:  [67-92] 89 (04/26 0429) Resp:  [13-25] 17 (04/26 0429) BP: (102-178)/(39-145) 116/53 mmHg (04/26 0429) SpO2:  [97 %-100 %] 97 % (04/26 0429) Weight:  [142 lb 6.4 oz (64.592 kg)] 142 lb 6.4 oz (64.592 kg) (04/26 0429) Physical Exam: GEN: pleasant, pale, sitting in chair Eye: conjunctivae pale, PERL, arcus senilis CVS: irregular bradycardia, S2>S1, no edema, DP pulse hard to palpate RESP: no increased work of breathing, good air movement bilaterally, no crackles or wheeze GI: +BS, soft, mild tender over RLQ, non-distended, no mass GU: no suprapubic tenderness NEURO: alert, awake and oriented, no gross defecits   Laboratory:  Recent Labs Lab 04/09/16 1646 04/10/16 0729 04/10/16 1316 04/10/16 2008 04/11/16 0606  WBC 7.5 8.2 7.1  --   --   HGB 6.7* 8.0* 8.1* 8.4* 7.1*  HCT 20.9* 24.7* 24.2* 25.5* 21.9*  PLT 203 153 143*  --   --     Recent Labs Lab 04/09/16 1646 04/10/16 0302  NA 141 140  K 3.5 3.9  CL 111 112*  CO2 19* 21*  BUN 53* 53*  CREATININE 1.01*  1.06*  CALCIUM 8.3* 7.9*  PROT 5.5*  --   BILITOT 0.2*  --   ALKPHOS 39  --   ALT 11*  --   AST 17  --   GLUCOSE 117* 114*    Imaging/Diagnostic Tests: No results found.  Mercy Riding, MD 04/11/2016, 8:23 AM PGY-1, [place] Intern pager: [phone], text pages welcome

## 2016-04-11 NOTE — Progress Notes (Signed)
Patient Name: Nicole Perez Date of Encounter: 04/11/2016     Active Problems:   Symptomatic anemia   Chest pain   Pain in the chest   Melena   Junctional bradycardia   Acute blood loss anemia   Angiodysplasia of duodenum with hemorrhage    SUBJECTIVE  No complaints at all. Asking when she will be able to go home.   CURRENT MEDS . sodium chloride   Intravenous Once  . ferrous sulfate  325 mg Oral BID WC    OBJECTIVE  Filed Vitals:   04/10/16 1659 04/10/16 2052 04/10/16 2314 04/11/16 0429  BP: 110/64 123/39 143/55 116/53  Pulse: 71 88 92 89  Temp: 98.1 F (36.7 C) 98.4 F (36.9 C) 98.6 F (37 C) 98.2 F (36.8 C)  TempSrc: Oral Oral Oral Oral  Resp:   18 17  Height:      Weight:    142 lb 6.4 oz (64.592 kg)  SpO2: 100% 100% 100% 97%    Intake/Output Summary (Last 24 hours) at 04/11/16 0905 Last data filed at 04/11/16 0855  Gross per 24 hour  Intake    480 ml  Output   1050 ml  Net   -570 ml   Filed Weights   04/09/16 2046 04/10/16 0450 04/11/16 0429  Weight: 128 lb 12.8 oz (58.423 kg) 141 lb 6.4 oz (64.139 kg) 142 lb 6.4 oz (64.592 kg)    PHYSICAL EXAM  General: Pleasant, NAD. Neuro: Alert and oriented X 3. Moves all extremities spontaneously. Psych: Normal affect. HEENT:  Normal  Neck: Supple without bruits or JVD. Lungs:  Resp regular and unlabored, CTA. Heart: RRR no s3, s4, or murmurs. Abdomen: Soft, non-tender, non-distended, BS + x 4.  Extremities: No clubbing, cyanosis or edema. DP/PT/Radials 2+ and equal bilaterally.  Accessory Clinical Findings  CBC  Recent Labs  04/09/16 1646 04/10/16 0729 04/10/16 1316 04/10/16 2008 04/11/16 0606  WBC 7.5 8.2 7.1  --   --   NEUTROABS 5.5  --   --   --   --   HGB 6.7* 8.0* 8.1* 8.4* 7.1*  HCT 20.9* 24.7* 24.2* 25.5* 21.9*  MCV 87.1 87.0 85.5  --   --   PLT 203 153 143*  --   --    Basic Metabolic Panel  Recent Labs  04/09/16 1646 04/10/16 0302  NA 141 140  K 3.5 3.9  CL 111 112*    CO2 19* 21*  GLUCOSE 117* 114*  BUN 53* 53*  CREATININE 1.01* 1.06*  CALCIUM 8.3* 7.9*   Liver Function Tests  Recent Labs  04/09/16 1646  AST 17  ALT 11*  ALKPHOS 39  BILITOT 0.2*  PROT 5.5*  ALBUMIN 3.1*   No results for input(s): LIPASE, AMYLASE in the last 72 hours. Cardiac Enzymes  Recent Labs  04/09/16 2145 04/10/16 0302 04/10/16 0907  TROPONINI <0.03 <0.03 0.04*   BNP Invalid input(s): POCBNP D-Dimer No results for input(s): DDIMER in the last 72 hours. Hemoglobin A1C  Recent Labs  04/09/16 2145  HGBA1C 6.3*   Fasting Lipid Panel  Recent Labs  04/09/16 2145  CHOL 108  HDL 27*  LDLCALC 59  TRIG 112  CHOLHDL 4.0   Thyroid Function Tests  Recent Labs  04/09/16 2145  TSH 1.491    TELE  NSR with freq sinus pauses. HR in 80s  Radiology/Studies  Dg Chest 2 View  04/09/2016  CLINICAL DATA:  Centralized chest pain for 1 day. Squeezing sensation,  palpitations for 1 day. EXAM: CHEST  2 VIEW COMPARISON:  02/22/2014 FINDINGS: Heart and mediastinal contours are within normal limits. No focal opacities or effusions. No acute bony abnormality. Degenerative changes in the shoulders. IMPRESSION: No active cardiopulmonary disease. Electronically Signed   By: Rolm Baptise M.D.   On: 04/09/2016 15:51    ASSESSMENT AND PLAN  Nicole Perez is a 82 y.o. female with a history of HTN and no prior cardiac history who presented with chest pain and SOB.  On admission she was noted to be markedly bradycardic with a junctional rhythm and rate in the 30s. This became intermittently sinus. She was found to be markedly anemic with hemoglobin of 6.  Bradycardia: Junctional rhythm on presentation. Home dilt-xr discontinued. Avoid AV blocking agent. Tele with NSR with HR in 80s w/ some sinus pauses. Further recommendation pending echocardiogram which will be done today.  Chest discomfort:  Likely due to anemia and now completely resolved after transfusion. Troponin  <0.03--> <0.03--> 0.04. 2D ECHO pending. May need stress test at some point, although negative troponin in the setting of severe anemia is likely a good sign there are no HD significant blockages.   Symptomatic anemia: Hg 6 s/p 2 PRBC. Hg  8.0--> 8/1--> 8.4--> 7.1. 04/09/16 EGD w/ several, non-bleeding AVMs in duodenum, all ablated with APC Laser. No ulcers. Started on a PPI drip as well as iron supplementation.  HTN: home dilt XR 180mg  stopped due to bradycardia. Will start lisinopril 5mg  daily in the setting of pre diabetes. Hg A1c 6.3  Signed, Eileen Stanford PA-C  Pager [phone]

## 2016-04-11 NOTE — Progress Notes (Signed)
      2D ECHO normal. i have arranged 30 day event monitor for this Friday at 2pm in our office. Cardiology will sign off.    Angelena Form PA-C  [facility]

## 2016-04-11 NOTE — Discharge Instructions (Signed)
It has been a pleasure taking care of you! You were admitted due to anemia (low blood level), which was likely due to blood loss in stool.  We also looked in to your esophagus, stomach and part of your intestine that showed some blood vessel abnormalities in upper part of your intestine that could be contributing to your blood loss in stool. We transfused you blood, which helped with your symptoms. Your blood level stayed stable after transfusion.  We are discharging you on a new medication that will help you make more blood. There could be some changes to your medication during this hospitalization. Please, make sure to read the directions before you take them. The names and directions on how to take these medications are found on this discharge paper under medication section.  You also need a follow up with your primary care doctor. The address, date and time are found on the discharge paper under follow up section.  Heart doctors will arrange follow up as outpatient. You should hear from them in a week or two  Take care,

## 2016-04-11 NOTE — Progress Notes (Signed)
*  PRELIMINARY RESULTS* Echocardiogram 2D Echocardiogram has been performed.  [name] 04/11/2016, 11:09 AM

## 2016-04-11 NOTE — Progress Notes (Signed)
Daily Rounding Note  04/11/2016, 9:01 AM  LOS: 2 days   SUBJECTIVE:       Up at 0530, self cathd.  Not dizzy or weak.  She is eating well.  No BM yet  OBJECTIVE:         Vital signs in last 24 hours:    Temp:  [98.1 F (36.7 C)-98.9 F (37.2 C)] 98.2 F (36.8 C) (04/26 0429) Pulse Rate:  [67-92] 89 (04/26 0429) Resp:  [13-25] 17 (04/26 0429) BP: (102-178)/(39-145) 116/53 mmHg (04/26 0429) SpO2:  [97 %-100 %] 97 % (04/26 0429) Weight:  [64.592 kg (142 lb 6.4 oz)] 64.592 kg (142 lb 6.4 oz) (04/26 0429) Last BM Date: 04/09/16 Filed Weights   04/09/16 2046 04/10/16 0450 04/11/16 0429  Weight: 58.423 kg (128 lb 12.8 oz) 64.139 kg (141 lb 6.4 oz) 64.592 kg (142 lb 6.4 oz)   General: looks well, comfortable   Heart: RRR.  NSR in 90s per tele.  Chest: clear bil.  No dyspnea or cough Abdomen: soft, NT, ND.  Active BS  Extremities: no CCE Neuro/Psych:  Oriented x 3.  No tremor or gross deficits.  In good spirits.   Intake/Output from previous day: 04/25 0701 - 04/26 0700 In: 240 [P.O.:240] Out: 1050 [Urine:1050]  Intake/Output this shift: Total I/O In: 240 [P.O.:240] Out: -   Lab Results:  Recent Labs  04/09/16 1646 04/10/16 0729 04/10/16 1316 04/10/16 2008 04/11/16 0606  WBC 7.5 8.2 7.1  --   --   HGB 6.7* 8.0* 8.1* 8.4* 7.1*  HCT 20.9* 24.7* 24.2* 25.5* 21.9*  PLT 203 153 143*  --   --    BMET  Recent Labs  04/09/16 1646 04/10/16 0302  NA 141 140  K 3.5 3.9  CL 111 112*  CO2 19* 21*  GLUCOSE 117* 114*  BUN 53* 53*  CREATININE 1.01* 1.06*  CALCIUM 8.3* 7.9*   LFT  Recent Labs  04/09/16 1646  PROT 5.5*  ALBUMIN 3.1*  AST 17  ALT 11*  ALKPHOS 39  BILITOT 0.2*   PT/INR  Recent Labs  04/09/16 1831  LABPROT 15.8*  INR 1.25   Hepatitis Panel No results for input(s): HEPBSAG, HCVAB, HEPAIGM, HEPBIGM in the last 72 hours.  Studies/Results: Dg Chest 2 View  04/09/2016  CLINICAL  DATA:  Centralized chest pain for 1 day. Squeezing sensation, palpitations for 1 day. EXAM: CHEST  2 VIEW COMPARISON:  02/22/2014 FINDINGS: Heart and mediastinal contours are within normal limits. No focal opacities or effusions. No acute bony abnormality. Degenerative changes in the shoulders. IMPRESSION: No active cardiopulmonary disease. Electronically Signed   By: Rolm Baptise M.D.   On: 04/09/2016 15:51   Scheduled Meds: . sodium chloride   Intravenous Once  . ferrous sulfate  325 mg Oral BID WC  . pantoprazole (PROTONIX) IV  80 mg Intravenous Q12H   Continuous Infusions: . sodium chloride 75 mL/hr at 04/11/16 0030   PRN Meds:.acetaminophen, HYDROcodone-acetaminophen, ondansetron (ZOFRAN) IV  ASSESMENT:   * Upper GI bleed.  03/2015 H pylori gastritis.  Treated with triple therapy but then stopped PPI.   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  No ulcers or mucosal injury.  On PPI drip  * Blood loss anemia. Normocytic. Improved post 2 PRBCs, but dropped overnight.  Overall Hgb is up 0.5 grams from admission nadir.  Dr Henrene Pastor initiated BID oral iron.    *  Bradycardia, sinus. Tolerating this well. Not hypotensive. Resolved off Diltiazem.  Echo planned and cards will switch to new med for BP control.    PLAN   *  Stopped PPI infusion, daily oral PPI.  Next Hgb due at 1400 today, may discharge today if stable, o/w stays overnight. Stopped 75/hour IVF (ok with cardiology). Echo planned for today.  Follow up Hgb at PMD clinic in next 10 to 14 days.    *  Uses daily MOM for constipation.  Miralax did not work for her.  She may need to up dose MOM now that she is taking BID iron.     Azucena Freed  04/11/2016, 9:01 AM Pager: [phone]  GI ATTENDING  Interval history data reviewed. Patient seen and examined. Agree with interval progress note. Status post EGD with patient and duodenal AVMs. Stable without bleeding. Stable hemoglobin. Recommend  oral iron indefinitely and monitoring hemoglobin with PCP. I discussed with daughter previously and provided her with a procedure note.Will sign off.  Docia Chuck. Geri Seminole., M.D. [facility] Division of Gastroenterology

## 2016-04-12 ENCOUNTER — Ambulatory Visit: Payer: Medicare Other | Admitting: Family Medicine

## 2016-04-12 DIAGNOSIS — D62 Acute posthemorrhagic anemia: Secondary | ICD-10-CM | POA: Diagnosis not present

## 2016-04-12 DIAGNOSIS — R079 Chest pain, unspecified: Secondary | ICD-10-CM | POA: Diagnosis not present

## 2016-04-12 DIAGNOSIS — R001 Bradycardia, unspecified: Secondary | ICD-10-CM | POA: Diagnosis not present

## 2016-04-12 DIAGNOSIS — K31811 Angiodysplasia of stomach and duodenum with bleeding: Secondary | ICD-10-CM | POA: Diagnosis not present

## 2016-04-12 LAB — TYPE AND SCREEN
ABO/RH(D): O POS
Antibody Screen: NEGATIVE
UNIT DIVISION: 0
Unit division: 0
Unit division: 0

## 2016-04-12 LAB — CBC
HCT: 22.4 % — ABNORMAL LOW (ref 36.0–46.0)
HCT: 23.2 % — ABNORMAL LOW (ref 36.0–46.0)
Hemoglobin: 7.3 g/dL — ABNORMAL LOW (ref 12.0–15.0)
Hemoglobin: 7.6 g/dL — ABNORMAL LOW (ref 12.0–15.0)
MCH: 27.9 pg (ref 26.0–34.0)
MCH: 28.5 pg (ref 26.0–34.0)
MCHC: 32.6 g/dL (ref 30.0–36.0)
MCHC: 32.8 g/dL (ref 30.0–36.0)
MCV: 85.5 fL (ref 78.0–100.0)
MCV: 86.9 fL (ref 78.0–100.0)
PLATELETS: 145 10*3/uL — AB (ref 150–400)
PLATELETS: 152 10*3/uL (ref 150–400)
RBC: 2.62 MIL/uL — ABNORMAL LOW (ref 3.87–5.11)
RBC: 2.67 MIL/uL — ABNORMAL LOW (ref 3.87–5.11)
RDW: 16.9 % — AB (ref 11.5–15.5)
RDW: 17.1 % — AB (ref 11.5–15.5)
WBC: 5.5 10*3/uL (ref 4.0–10.5)
WBC: 5.5 10*3/uL (ref 4.0–10.5)

## 2016-04-12 MED ORDER — SODIUM CHLORIDE 0.9 % IV SOLN
510.0000 mg | Freq: Once | INTRAVENOUS | Status: AC
  Administered 2016-04-12: 510 mg via INTRAVENOUS
  Filled 2016-04-12 (×2): qty 17

## 2016-04-12 MED ORDER — SODIUM CHLORIDE 0.9 % IV SOLN: 510.0000 mg | Freq: Once | INTRAVENOUS | Status: DC

## 2016-04-12 MED ORDER — TRAZODONE HCL 50 MG PO TABS
50.0000 mg | ORAL_TABLET | Freq: Every evening | ORAL | Status: DC | PRN
Start: 2016-04-12 — End: 2016-04-12
  Administered 2016-04-12: 50 mg via ORAL
  Filled 2016-04-12: qty 1

## 2016-04-12 NOTE — Discharge Summary (Signed)
[facility] Discharge Summary  Patient name: Nicole Perez Medical record number: [id number] Date of birth: 08-Jul-1933 Age: 82 y.o. Gender: female Date of Admission: 04/09/2016  Date of Discharge: 04/12/2016  Admitting Physician: Lind Covert, MD  Primary Care Provider: MCDIARMID,TODD D, MD Consultants: Cardiology, GI  Indication for Hospitalization: symptomatic blood loss anemia and GIB  Discharge Diagnoses/Problem List:  Blood loss anemia UGIB Angiodysplasia Hypertension Constipation Neurogenic Bladder Osteoarthritis   Disposition: home  Discharge Condition: stable  Discharge Exam:  Filed Vitals:   04/12/16 0003 04/12/16 0500 04/12/16 1510 04/12/16 1704  BP: 95/50 100/48 111/65 109/51  Pulse: 84 77 85 87  Temp: 98.2 F (36.8 C) 98 F (36.7 C) 98.4 F (36.9 C) 98.2 F (36.8 C)  TempSrc: Oral Oral Oral Oral  Resp:   18 18  Height:      Weight:  142 lb 1.6 oz (64.456 kg)    SpO2: 98% 99% 100% 98%   GEN: pleasant, sitting in bed and eating her breakfast CVS: irregular, S2>S1, no edema, DP pulse hard to palpate RESP: no increased work of breathing, good air movement bilaterally, no crackles or wheeze GI: +BS, soft, mild tender over RLQ, non-distended, no mass GU: no suprapubic tenderness NEURO: alert, awake and oriented, no gross defecits   Brief Hospital Course:  TRINIDY KERNELL is a 82 y.o. female with history of hypertension, neurogenic bladder and OA who was admitted with chest pain and symptomatic anemia secondary to upper GIB.   Symptomatic anemia secondary to UGIB: patient had chest pain, dyspnea, palpitation and diaphoresis the morning of admission. She had worsening fatigue and melanotic stool for two weeks prior to admission. On arrival to ED via EMS, she was hypotensive to 80's/50's. She received 500 mls of NS bolus. Hgb 6.7 from 11 four months prior. FOBT positive. GI was consulted and recommended 2 units of blood. She  was admitted to step down unit.  On the unit, she received 2 units of blood with Hgb rise to 8.4. However, her Hgb dropped to 7.1 the following morning. She also had large melanotic bowel movement and became symptomatic. So, she received another units. With that her Hgb rose to 7.3. When repeated, Hgb stayed stable at 7.6. Patient's also felt much better from symptom stand point. She received Feraheme 510 mg prior to discharge home on oral iron. She was advised to titrate her Miralax or MOM for a goal of at least one bowel movement every other day.  Patient had an EGD done by GI that revealed angiodysplasia as likely cause of her bleeding. She had argon plasma coagulation for six  angiodysplastic lesions in her duodenum, each about 2 mm in size.   Chest pain: ACS was ruled out with serial negative troponin and EKGs. She was bradycardic to 40's the first night in hospital.  EKG on admission with junctional rhythm but sign of ishcemia. She was on diltiazem presumably for hypertension at home. This was discontinued. With that bradycardia resolved and she was converted to sinus rhythm on the follow up EKG. BNP elevated to 313 but no sign of fluid overload on exam, and chest x-ray was normal as well. Echo with EF of 65-70%, G1DD. No motion abnormality. Patient will have stress test and 30 day event monitor as outpatient. Cardiology will arrange this.  Hypertension: patient was discharged on home hydrochlorothiazide. Recommend BMP at follow up.  Other chronic conditions stable.  Issues for Follow Up:  1. Anemia 2/2 UGIB:  assess GIB and check CBC 2. Hypertension: assess BP at follow up. Can consider adding Amlodipine if hypertensive 3. Bradycardia and Junctional rhythm: resolved at discharge. But cardiology to arrange outpatient stress test and 30-day event monitor. 4. Constipation: advised to titrate her Miralax or MOM for goal to at least on bowel movement in 48 hours. She has history of rectal  prolapse.  Significant Procedures: EGD  Significant Labs and Imaging:   Recent Labs Lab 04/11/16 1200 04/12/16 0540 04/12/16 1121  WBC 7.4 5.5 5.5  HGB 7.2* 7.3* 7.6*  HCT 22.1* 22.4* 23.2*  PLT 158 145* 152    Recent Labs Lab 04/09/16 1646 04/10/16 0302  NA 141 140  K 3.5 3.9  CL 111 112*  CO2 19* 21*  GLUCOSE 117* 114*  BUN 53* 53*  CREATININE 1.01* 1.06*  CALCIUM 8.3* 7.9*  ALKPHOS 39  --   AST 17  --   ALT 11*  --   ALBUMIN 3.1*  --      Results/Tests Pending at Time of Discharge: none  Discharge Medications:    Medication List    STOP taking these medications        DILT-XR 180 MG 24 hr capsule  Generic drug:  diltiazem      TAKE these medications        ferrous sulfate 325 (65 FE) MG tablet  Take 1 tablet (325 mg total) by mouth 2 (two) times daily with a meal.     hydrochlorothiazide 12.5 MG capsule  Commonly known as:  MICROZIDE  Take 1 capsule (12.5 mg total) by mouth daily.     HYDROcodone-acetaminophen 10-325 MG tablet  Commonly known as:  NORCO  Take 1 tablet by mouth every 12 (twelve) hours as needed for moderate pain or severe pain.     lidocaine 2 % jelly  Commonly known as:  XYLOCAINE  Apply 1 dime size amount of gel to the area between your vagina and rectum once daily for pain. You may use up on 2 times per day if needed     lisinopril 5 MG tablet  Commonly known as:  PRINIVIL,ZESTRIL  Take 1 tablet (5 mg total) by mouth daily.     magnesium hydroxide 400 MG/5ML suspension  Commonly known as:  MILK OF MAGNESIA  Take 30 mLs by mouth daily as needed for mild constipation.     polyethylene glycol powder powder  Commonly known as:  GLYCOLAX/MIRALAX  Take 17 g by mouth daily as needed for mild constipation, moderate constipation or severe constipation.        Discharge Instructions: Please refer to Patient Instructions section of EMR for full details.  Patient was counseled important signs and symptoms that should prompt  return to medical care, changes in medications, dietary instructions, activity restrictions, and follow up appointments.   Follow-Up Appointments:     Follow-up Information    Follow up with Phill Myron, MD On 04/16/2016.   Specialty:  Family Medicine   Why:  at 3:30 pm for hospital follow up   Contact information:   [place] [address] [phone]       Follow up with [facility] On 04/13/2016.   Why:  @ 2pm to pick up your monitor.    Contact information:   [place] [phone]      Mercy Riding, MD 04/12/2016, 9:13 PM PGY-1, [place]

## 2016-04-12 NOTE — Evaluation (Addendum)
Physical Therapy Evaluation Patient Details Name: Nicole Perez MRN: [id number] DOB: 09/04/1933 Today's Date: 04/12/2016   History of Present Illness  82 y.o. female presenting with chest pain and GI bleed. PMH is significant for hypertension, neurogenic bladder and OA  Clinical Impression  Patient demonstrates deficits in functional mobility and activity tolerance as indicated below. Feel patient will benefit from continued skilled PT services but anticipate will be safe for d/c home with supervision for mobility. Confirmed that this was available. Recommend HHPT. Will defer to HHPT as patient for d/c home today.    Follow Up Recommendations Home health PT;Supervision for mobility/OOB    Equipment Recommendations  None recommended by PT    Recommendations for Other Services       Precautions / Restrictions Precautions Precautions: Fall      Mobility  Bed Mobility Overal bed mobility: Modified Independent             General bed mobility comments: increased time to perform, no physical assist required  Transfers Overall transfer level: Needs assistance   Transfers: Sit to/from Stand Sit to Stand: Min guard         General transfer comment: min guard for stability, increased time to perform  Ambulation/Gait Ambulation/Gait assistance: Supervision Ambulation Distance (Feet): 140 Feet Assistive device: Rolling walker (2 wheeled) Gait Pattern/deviations: Step-through pattern;Decreased stride length;Shuffle;Drifts right/left;Trunk flexed Gait velocity: decreased   General Gait Details: decreased activity tolerance, modest instability noted, 2 standing rest breaks  Stairs            Wheelchair Mobility    Modified Rankin (Stroke Patients Only)       Balance Overall balance assessment: History of Falls                                           Pertinent Vitals/Pain Pain Assessment: 0-10 Pain Score: 8  Pain Location: rectum Pain  Descriptors / Indicators: Discomfort Pain Intervention(s): Monitored during session;Repositioned    Home Living Family/patient expects to be discharged to:: Private residence Living Arrangements: Alone Available Help at Discharge: Family;Available PRN/intermittently Type of Home: House Home Access: Ramped entrance     Home Layout: One level Home Equipment: Walker - 2 wheels;Walker - 4 wheels      Prior Function Level of Independence: Independent with assistive device(s)         Comments: uses walker at hime     Hand Dominance   Dominant Hand: Right    Extremity/Trunk Assessment   Upper Extremity Assessment: Overall WFL for tasks assessed           Lower Extremity Assessment: Overall WFL for tasks assessed         Communication   Communication: No difficulties  Cognition Arousal/Alertness: Awake/alert Behavior During Therapy: WFL for tasks assessed/performed Overall Cognitive Status: Within Functional Limits for tasks assessed                      General Comments      Exercises        Assessment/Plan    PT Assessment All further PT needs can be met in the next venue of care (for d/c home today with HHPT)  PT Diagnosis Difficulty walking;Abnormality of gait;Acute pain   PT Problem List Decreased activity tolerance;Decreased balance;Decreased mobility;Pain  PT Treatment Interventions     PT Goals (Current goals can be found  in the Care Plan section) Acute Rehab PT Goals Patient Stated Goal: to go home PT Goal Formulation: All assessment and education complete, DC therapy    Frequency     Barriers to discharge        Co-evaluation               End of Session Equipment Utilized During Treatment: Gait belt Activity Tolerance: Patient tolerated treatment well Patient left: in bed;with call bell/phone within reach;with family/visitor present Nurse Communication: Mobility status         Time: 6384-5364 PT Time Calculation  (min) (ACUTE ONLY): 17 min   Charges:   PT Evaluation $PT Eval Moderate Complexity: 1 Procedure     PT G Codes:   PT G-Codes **NOT FOR INPATIENT CLASS** Functional Assessment Tool Used: clinical judgement Functional Limitation: Mobility: Walking and moving around Mobility: Walking and Moving Around Current Status (W8032): At least 1 percent but less than 20 percent impaired, limited or restricted Mobility: Walking and Moving Around Goal Status [phone]): At least 1 percent but less than 20 percent impaired, limited or restricted Mobility: Walking and Moving Around Discharge Status [phone]): At least 1 percent but less than 20 percent impaired, limited or restricted    Duncan Dull 04/12/2016, 4:33 PM  Alben Deeds, [place] DPT  [phone]

## 2016-04-12 NOTE — Progress Notes (Signed)
Family Medicine Teaching Service Daily Progress Note Intern Pager: [phone]  Patient name: Nicole Perez Medical record number: [id number] Date of birth: Nov 05, 1933 Age: 82 y.o. Gender: female  Primary Care Provider: MCDIARMID,TODD D, MD Consultants: GI and cardiology Code Status: DNR (obtained on admission)  Pt Overview and Major Events to Date:  4/24: admitted with symptomatic anemia  Assessment and Plan: Nicole Perez is a 82 y.o. female presenting with chest pain and GI bleed. PMH is significant for hypertension, neurogenic bladder and OA  Symptomatic anemia in the setting of UGIB: improved. Hgb 6.7.  B/l 11 as late as 09/2015. Hgb 8 after 2 units but trended down to 7.1. Had bloody BM after that and was also lightheaded. So given 1 units. Hgb 7.3 this morning. - Two bore IV lines in place - Typed and screened on4/24 - CBC at noon. If no improvement, will talk to GI again. If better will give iron infusion. - GI recs  -Angiodysplasia likely cause of her UGIB  -EGD: with six 73mm angiodysplastic lesions s/p argon plasma coagulation  -Upper upper endoscopy with push enteroscopy if continues to bleed  -Ferrous sulfate indefinitely - Protonix 40 mg once daily - D/c lisinopril and home diltiazem. Hold home HCTZ  Chest pain/Bradycardia: resolved. ACS ruled out. BNP elevated to 313 but no sign of fluid overload on exam.  No orthopnea or crackles. Echo 4/26 EF 65-70%, G1DD. No motion abnormality - Card recs  -D/c diltiazem  -stress test as outpt  -30 day monitor for junctional rhythm on EKG on admission - Nitroglycerin 0.4mg  SL q72min prn chest pain  - O2 by [place] prn hypoxemia - Avoiding ASA in absence of definitive evidence of ischemia  Hypertension: mildly hypotensive overnight. Lisinopril started for hypertension with prediabetes. JNC 8 doesn't recommend this in AA. -D/c lisinopril and home diltiazem.  -Hold home HCTZ while hypotensive -Will consider IVF if continues to be  hypotensive. -Monitor BP  Osteoarthritis: history of back & LE OA. On Lorcet at home -Continue home Lorcet three times a day as needed.  - Avoid NSAIDs  Neurogenic bladder: she does self cath at home. No dysuria or sign of infection.  -I&O cath  Constipation: had bloody BM yesterday after MOM -MOM as needed  FEN/GI: -soft diet -Has good PO  -MOM as needed  PPX: SCD given GI bleed  Disposition: possible discharge home after IV iron if Hgb better.   Subjective:  No complaints this morning. Denies chest pain, shortness of breath and abdominal pain. Had large BM after MOM yesterday, which was bloody. Received 1 unit of blood. Hgb trended from 7.1 to 7.3.  Objective: Temp:  [97.8 F (36.6 C)-98.6 F (37 C)] 98 F (36.7 C) (04/27 0500) Pulse Rate:  [77-90] 77 (04/27 0500) Resp:  [12-18] 17 (04/26 2045) BP: (95-143)/(48-80) 100/48 mmHg (04/27 0500) SpO2:  [98 %-100 %] 99 % (04/27 0500) Weight:  [142 lb 1.6 oz (64.456 kg)] 142 lb 1.6 oz (64.456 kg) (04/27 0500) Physical Exam: GEN: pleasant, sitting in bed and eating her breakfast CVS: irregular, S2>S1, no edema, DP pulse hard to palpate RESP: no increased work of breathing, good air movement bilaterally, no crackles or wheeze GI: +BS, soft, mild tender over RLQ, non-distended, no mass GU: no suprapubic tenderness NEURO: alert, awake and oriented, no gross defecits   Laboratory:  Recent Labs Lab 04/10/16 1316  04/11/16 0606 04/11/16 1200 04/12/16 0540  WBC 7.1  --   --  7.4 5.5  HGB 8.1*  < >  7.1* 7.2* 7.3*  HCT 24.2*  < > 21.9* 22.1* 22.4*  PLT 143*  --   --  158 145*  < > = values in this interval not displayed.  Recent Labs Lab 04/09/16 1646 04/10/16 0302  NA 141 140  K 3.5 3.9  CL 111 112*  CO2 19* 21*  BUN 53* 53*  CREATININE 1.01* 1.06*  CALCIUM 8.3* 7.9*  PROT 5.5*  --   BILITOT 0.2*  --   ALKPHOS 39  --   ALT 11*  --   AST 17  --   GLUCOSE 117* 114*    Imaging/Diagnostic Tests: No results  found.  Mercy Riding, MD 04/12/2016, 7:27 AM PGY-1, [place] Intern pager: [phone], text pages welcome

## 2016-04-13 ENCOUNTER — Other Ambulatory Visit: Payer: Self-pay | Admitting: Internal Medicine

## 2016-04-13 ENCOUNTER — Other Ambulatory Visit: Payer: Self-pay | Admitting: Family Medicine

## 2016-04-13 ENCOUNTER — Ambulatory Visit (INDEPENDENT_AMBULATORY_CARE_PROVIDER_SITE_OTHER): Payer: Medicare Other

## 2016-04-13 DIAGNOSIS — M170 Bilateral primary osteoarthritis of knee: Secondary | ICD-10-CM

## 2016-04-13 DIAGNOSIS — R001 Bradycardia, unspecified: Secondary | ICD-10-CM | POA: Diagnosis not present

## 2016-04-13 DIAGNOSIS — M47812 Spondylosis without myelopathy or radiculopathy, cervical region: Secondary | ICD-10-CM

## 2016-04-13 DIAGNOSIS — M48061 Spinal stenosis, lumbar region without neurogenic claudication: Secondary | ICD-10-CM

## 2016-04-13 DIAGNOSIS — M150 Primary generalized (osteo)arthritis: Secondary | ICD-10-CM

## 2016-04-13 DIAGNOSIS — G608 Other hereditary and idiopathic neuropathies: Secondary | ICD-10-CM

## 2016-04-13 DIAGNOSIS — M549 Dorsalgia, unspecified: Secondary | ICD-10-CM

## 2016-04-13 DIAGNOSIS — G8929 Other chronic pain: Secondary | ICD-10-CM

## 2016-04-13 DIAGNOSIS — M159 Polyosteoarthritis, unspecified: Secondary | ICD-10-CM

## 2016-04-13 DIAGNOSIS — G629 Polyneuropathy, unspecified: Secondary | ICD-10-CM

## 2016-04-13 DIAGNOSIS — M160 Bilateral primary osteoarthritis of hip: Secondary | ICD-10-CM

## 2016-04-13 DIAGNOSIS — R531 Weakness: Secondary | ICD-10-CM

## 2016-04-13 DIAGNOSIS — M4804 Spinal stenosis, thoracic region: Secondary | ICD-10-CM

## 2016-04-13 NOTE — Telephone Encounter (Signed)
Needs refill on her oxycodone. She was not given any refills while she was in the hospital. Please call daughter when ready to be picked up

## 2016-04-16 ENCOUNTER — Other Ambulatory Visit: Payer: Self-pay

## 2016-04-16 ENCOUNTER — Telehealth: Payer: Self-pay | Admitting: Family Medicine

## 2016-04-16 ENCOUNTER — Encounter (HOSPITAL_COMMUNITY): Payer: Self-pay | Admitting: Emergency Medicine

## 2016-04-16 ENCOUNTER — Emergency Department (HOSPITAL_COMMUNITY): Payer: Medicare Other

## 2016-04-16 ENCOUNTER — Encounter: Payer: Self-pay | Admitting: Family Medicine

## 2016-04-16 ENCOUNTER — Other Ambulatory Visit (HOSPITAL_COMMUNITY): Payer: Self-pay

## 2016-04-16 ENCOUNTER — Inpatient Hospital Stay (HOSPITAL_COMMUNITY)
Admission: EM | Admit: 2016-04-16 | Discharge: 2016-05-03 | DRG: 357 | Disposition: A | Discharge status: Other Acute Inpatient Hospital | Payer: Medicare Other | Attending: Family Medicine | Admitting: Family Medicine

## 2016-04-16 ENCOUNTER — Ambulatory Visit (INDEPENDENT_AMBULATORY_CARE_PROVIDER_SITE_OTHER): Payer: Medicare Other | Admitting: Family Medicine

## 2016-04-16 VITALS — BP 99/62 | HR 84 | Temp 98.7°F | Ht 59.0 in | Wt 141.0 lb

## 2016-04-16 DIAGNOSIS — N179 Acute kidney failure, unspecified: Secondary | ICD-10-CM | POA: Diagnosis present

## 2016-04-16 DIAGNOSIS — K264 Chronic or unspecified duodenal ulcer with hemorrhage: Secondary | ICD-10-CM | POA: Diagnosis not present

## 2016-04-16 DIAGNOSIS — K5900 Constipation, unspecified: Secondary | ICD-10-CM | POA: Diagnosis not present

## 2016-04-16 DIAGNOSIS — M19079 Primary osteoarthritis, unspecified ankle and foot: Secondary | ICD-10-CM | POA: Diagnosis present

## 2016-04-16 DIAGNOSIS — R9431 Abnormal electrocardiogram [ECG] [EKG]: Secondary | ICD-10-CM | POA: Diagnosis not present

## 2016-04-16 DIAGNOSIS — Z66 Do not resuscitate: Secondary | ICD-10-CM | POA: Diagnosis present

## 2016-04-16 DIAGNOSIS — K644 Residual hemorrhoidal skin tags: Secondary | ICD-10-CM | POA: Insufficient documentation

## 2016-04-16 DIAGNOSIS — K31811 Angiodysplasia of stomach and duodenum with bleeding: Principal | ICD-10-CM | POA: Diagnosis present

## 2016-04-16 DIAGNOSIS — H903 Sensorineural hearing loss, bilateral: Secondary | ICD-10-CM | POA: Diagnosis present

## 2016-04-16 DIAGNOSIS — K921 Melena: Secondary | ICD-10-CM | POA: Diagnosis not present

## 2016-04-16 DIAGNOSIS — R0789 Other chest pain: Secondary | ICD-10-CM | POA: Diagnosis not present

## 2016-04-16 DIAGNOSIS — N319 Neuromuscular dysfunction of bladder, unspecified: Secondary | ICD-10-CM | POA: Diagnosis not present

## 2016-04-16 DIAGNOSIS — I10 Essential (primary) hypertension: Secondary | ICD-10-CM | POA: Diagnosis present

## 2016-04-16 DIAGNOSIS — Z87891 Personal history of nicotine dependence: Secondary | ICD-10-CM | POA: Diagnosis not present

## 2016-04-16 DIAGNOSIS — H109 Unspecified conjunctivitis: Secondary | ICD-10-CM | POA: Diagnosis present

## 2016-04-16 DIAGNOSIS — R079 Chest pain, unspecified: Secondary | ICD-10-CM

## 2016-04-16 DIAGNOSIS — K922 Gastrointestinal hemorrhage, unspecified: Secondary | ICD-10-CM | POA: Diagnosis not present

## 2016-04-16 DIAGNOSIS — D638 Anemia in other chronic diseases classified elsewhere: Secondary | ICD-10-CM | POA: Diagnosis present

## 2016-04-16 DIAGNOSIS — I248 Other forms of acute ischemic heart disease: Secondary | ICD-10-CM | POA: Diagnosis not present

## 2016-04-16 DIAGNOSIS — K274 Chronic or unspecified peptic ulcer, site unspecified, with hemorrhage: Secondary | ICD-10-CM | POA: Insufficient documentation

## 2016-04-16 DIAGNOSIS — K31819 Angiodysplasia of stomach and duodenum without bleeding: Secondary | ICD-10-CM

## 2016-04-16 DIAGNOSIS — Q2733 Arteriovenous malformation of digestive system vessel: Secondary | ICD-10-CM | POA: Diagnosis not present

## 2016-04-16 DIAGNOSIS — D649 Anemia, unspecified: Secondary | ICD-10-CM | POA: Diagnosis present

## 2016-04-16 DIAGNOSIS — K2971 Gastritis, unspecified, with bleeding: Secondary | ICD-10-CM

## 2016-04-16 DIAGNOSIS — D62 Acute posthemorrhagic anemia: Secondary | ICD-10-CM

## 2016-04-16 DIAGNOSIS — E785 Hyperlipidemia, unspecified: Secondary | ICD-10-CM | POA: Diagnosis present

## 2016-04-16 DIAGNOSIS — K2901 Acute gastritis with bleeding: Secondary | ICD-10-CM

## 2016-04-16 DIAGNOSIS — R531 Weakness: Secondary | ICD-10-CM | POA: Diagnosis not present

## 2016-04-16 HISTORY — DX: Angiodysplasia of stomach and duodenum without bleeding: K31.819

## 2016-04-16 LAB — CBC
HEMATOCRIT: 18.1 % — AB (ref 35.0–45.0)
HEMOGLOBIN: 5.7 g/dL — AB (ref 11.7–15.5)
MCH: 29.5 pg (ref 27.0–33.0)
MCHC: 31.5 g/dL — AB (ref 32.0–36.0)
MCV: 93.8 fL (ref 80.0–100.0)
MPV: 10.4 fL (ref 7.5–12.5)
Platelets: 225 10*3/uL (ref 140–400)
RBC: 1.93 MIL/uL — ABNORMAL LOW (ref 3.80–5.10)
RDW: 21 % — AB (ref 11.0–15.0)
WBC: 7.8 10*3/uL (ref 3.8–10.8)

## 2016-04-16 MED ORDER — ESOMEPRAZOLE MAGNESIUM 40 MG PO CPDR: 40.0000 mg | DELAYED_RELEASE_CAPSULE | Freq: Every day | ORAL | Status: DC

## 2016-04-16 MED ORDER — HYDROCODONE-ACETAMINOPHEN 10-325 MG PO TABS: 1.0000 | ORAL_TABLET | Freq: Two times a day (BID) | ORAL | Status: DC | PRN

## 2016-04-16 NOTE — Progress Notes (Signed)
   Subjective:    Patient ID: Nicole Perez, female    DOB: 04/13/1933, 82 y.o.   MRN: [id number]  Seen for hospital follow-up for   CC: GI bleed  She reports continued epigastric pain that has slowly improving.  Pain is made worse with iron pills, she also cause diarrhea so she is not taking iron supplements for the past 2 days.  She has had some episodes of melena but denies frank hematochezia.  Continues to endorse some fatigue and shortness of breath that are slightly improved from discharge.  Denies chest pain, palpitations, syncopal episodes.  She has met with cardiology and is wearing her and monitor.  She was not discharged on PPI.   Smoking history noted Review of Systems   See HPI for ROS. Objective:  BP 99/62 mmHg  Pulse 84  Temp(Src) 98.7 F (37.1 C) (Oral)  Ht 4' 11" (1.499 m)  Wt 141 lb (63.957 kg)  BMI 28.46 kg/m2  SpO2 100%  LMP 12/17/1962  General: NAD Cardiac: RRR, normal heart sounds, no murmurs. 2+ radial and PT pulses bilaterally Abdomen: soft, nontender, nondistended,  Bowel sounds present Extremities: no edema or cyanosis. WWP. Skin: warm and dry, no rashes noted    Assessment & Plan:   Gastric AVM Continued epigastric pain; not discharged on PPI - Start Nexium 40 mg daily - Follow-up in 1-2 weeks; consider referral back to GI if pain and melena persist - Recheck hemoglobin and BMP - Consider restarting iron supplement once epigastric pain resolves; may need IV iron supplementation if unable to tolerate oral  Essential hypertension, benign Blood pressure low in clinic - Advised holding lisinopril - Recommended checking blood pressure daily and call  clinic for pressures greater than 150/90 - Follow-up 1-2 weeks. Consider restarting lisinopril or HCTZ  if BP increases     

## 2016-04-16 NOTE — Telephone Encounter (Signed)
Pt notified of Rx ready for pick up at the front.

## 2016-04-16 NOTE — Telephone Encounter (Signed)
Rx printed given to Page on blue team  Thanks

## 2016-04-16 NOTE — Patient Instructions (Signed)
Stop taking lisinopril.  - Check your blood pressure every day and call if your blood pressures are consistently greater than 150/90  Start Nexium 40 mg every day.  Call if you develop worsening stomach pain, vomiting, bloody stools, chest pain or worsening shortness of breath.

## 2016-04-16 NOTE — Assessment & Plan Note (Signed)
Continued epigastric pain; not discharged on PPI - Start Nexium 40 mg daily - Follow-up in 1-2 weeks; consider referral back to GI if pain and melena persist - Recheck hemoglobin and BMP - Consider restarting iron supplement once epigastric pain resolves; may need IV iron supplementation if unable to tolerate oral

## 2016-04-16 NOTE — Telephone Encounter (Signed)
Received critical lab result from [facility].  Hgb 5.7.  Called patient to discuss.  She was seen in office today for hospital follow up after being hospitalized for GI bleed.  She required transfusion at that time.  Apparently, was not discharged on PPI.  She denies hematochezia but endorses epigastric pain and fatigue. No CP, SOB, palpitations.  Advised that she should come to ED for evaluation/ possible admission for transfusion.  This drop is almost a 2 gram drop from discharge on 4/27.  Daughter notes that they will bring her in for evaluation.  Charon Smedberg M. Lajuana Ripple, DO PGY-2, [place]

## 2016-04-16 NOTE — ED Provider Notes (Signed)
CSN: [id number]     Arrival date & time 04/16/16  2311 History  By signing my name below, I, Hansel Feinstein, attest that this documentation has been prepared under the direction and in the presence of Sharlett Iles, MD. Electronically Signed: Hansel Feinstein, ED Scribe. 04/16/2016. 11:46 PM.    Chief Complaint  Patient presents with  . Chest Pain  . Abnormal Lab   The history is provided by the patient, a relative and medical records. No language interpreter was used.   HPI Comments: Nicole Perez is a 82 y.o. female with h/o HTN, HLD, neurogenic bladder, recent GIB 2/2 angiodysplasia who presents to the Emergency Department complaining of intermittent, burning, substernal CP onset this afternoon with associated SOB and lower abdominal pain. Pt states that she was having mild CP this afternoon and woke up from sleep this evening with worsened pain. She reports that she currently has no CP. Pt was recently admitted to the hospital on 04/10/15 for 4 days with CP and melena. Pt reports she had melena yesterday, but has not had a BM since. She notes she was told this could be attributed to her iron infusion, which she received prior to discharge. She also reports that she was discharged with PO iron, but states it makes her nauseated. She  was seen by her PCP today and given medication for heart burn, but reports no relief of her current symptoms with this medication. Pt denies nausea, diaphoresis, emesis, fever, dysuria, lightheadedness. Per daughter, pt also had Hgb of 4 today and was told to bring her in for evaluation. Per pt, she uses in and out cath at baseline.  On record review, EGD on 04/11/15 showed angiodysplasia as the cause of her melena.   Past Medical History  Diagnosis Date  . Essential hypertension, benign 04/18/2010  . Lichen sclerosus et atrophicus of the vulva   . BACK PAIN, CHRONIC 04/18/2010    degenerative spine disease, spinal stenosis throughout spine  . DEGENERATIVE JOINT DISEASE,  HIPS 09/11/2007    Multilevel degenerative spine dz and spinal stenosis  . INSOMNIA, CHRONIC 04/18/2010  . Parotid adenoma 1990, 2012    Right parotid, recurrent parotid pleimorphic adenoma.   . Osteoarthritis, multiple sites 08/11/2013  . Cystocele 08/11/2013  . Acute renal failure ([place]) 02/23/2014  . History of pneumonia 07/26/2011  . Rectal fissure 12/16/2013  . UTI (urinary tract infection) 02/22/2014  . 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 Hearing loss sensory, bilateral 07/26/2011    Right >> Left.  Left ear hearing aid b/c work discrimination in       R. ear is very poor. Audiologist-Stephanie Nance at [facility] in [place].  (05/10/2010)   . HYPERLIPIDEMIA 05/10/2010    Qualifier: Diagnosis of  By: McDiarmid MD, Sherren Mocha    . Incontinence overflow, urine 04/28/2014  . Incomplete emptying of bladder 02/09/2014  . Paresthesia of both feet 08/06/2014  . Orthostatic hypotension 08/06/2014  . Blepharitis 11/16/2014    Diagnosis by optometrist, Renaldo Harrison on exam 11/13/2014  . Dry eye syndrome 11/16/2014  . Glaucoma suspect 11/16/2014  . Blood in stool 12/20/2014  . Cholelithiasis   . Colon polyps 2006. 2016    adenomatous and hyperplaxtic  . H. pylori infection 2016    h pylori erosive gastritis, treated with PPI, antibiotics.   . Hiatal hernia 05/05/2015    Large Hiatal Hernia found on EGD  by Dr Hilarie Fredrickson (GI in [place]) in work up of melena and (+) FOBT.  Marland Kitchen Urethral polyp 11/17/2015  . 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  ablated.   . Overflow incontinence 04/28/2014  . Complicated UTI (urinary tract infection) 01/30/2016   Past Surgical History  Procedure Laterality Date  . Total abdominal hysterectomy w/ bilateral salpingoophorectomy  1964    Hysterectomy and bilateral oopherectomy at age 18 for benign reasons  . Bladder suspension      Bladder tack x 2 (Dr Janice Norrie)  . Cystocele repair      Rectal prolapse  and cyctocele adter hysterectomy requiring anterior repair Jerilynn Mages Edwinna Areola, MD)  . Rectocele repair      Rectal prolapse and cyctocele adter hysterectomy requiring anterior repair Jerilynn Mages Edwinna Areola, MD)  . Urethral dilation    . Parotid gland tumor excision  1990    S/P excision of Right Parotid Gland Benign Tumor, 1990  . Reconstruction of nose  1994    Nasal bridge reconstruction (Dr Judie Grieve, 1994) for following  Forklift accident on job. Surgery complicated by nerve damage resulting in difficulty raising right eyebrow   . Carpal tunnel release  2010    Carpel Tunnel Release of  left wrist  03/2009 (Dr Fredna Dow): Nerve   . Cataract extraction w/ intraocular lens implant  2010    Dr Charise Killian (ophth)  . Breast lumpectomy      Lumpectomy of benign Breast lumps bilaterally, Dr Bubba Camp  . Laminectomy      S/P L4-5 Laminectomy (1987) for decompression of spinal stenosis  . Colonoscopy w/ polypectomy  2006  . Parotidectomy  08/30/11    Radene Journey, MD (ENT) for recurrent right parotid pleomorphic adenoma by frozen section  . Carpal tunnel release      right wrist  . Esophagogastroduodenoscopy N/A 04/10/2016    Procedure: ESOPHAGOGASTRODUODENOSCOPY (EGD);  Surgeon: Irene Shipper, MD;  Location: [facility] ENDOSCOPY;  Service: Endoscopy;  Laterality: N/A;   Family History  Problem Relation Age of Onset  . Coronary artery disease Mother   . Coronary artery disease Sister     open heart surgery  . Diabetes type II Sister   . Stroke Father 2  . Hypertension Father   . Lupus Brother   . Heart disease Brother   . Heart attack Sister    Social History  Substance Use Topics  . Smoking status: Former Smoker    Quit date: 01/14/1984  . Smokeless tobacco: Never Used  . Alcohol Use: No   OB History    Gravida Para Term Preterm AB TAB SAB Ectopic Multiple Living   8 8        7       Obstetric Comments   One daughter passed away one year ago All vaginal deliveries     Review of Systems A complete  10 system review of systems was obtained and all systems are negative except as noted in the HPI and PMH.    Allergies  Nitrofurantoin  Home Medications   Prior to Admission medications   Medication Sig Start Date End Date Taking? Authorizing Provider  esomeprazole (NEXIUM) 40 MG capsule Take 1 capsule (40 mg total) by mouth daily. 04/16/16   Olam Idler, MD  ferrous sulfate 325 (65 FE) MG tablet Take 1 tablet (325 mg total) by mouth 2 (two) times daily with a meal. 04/11/16   Mercy Riding, MD  hydrochlorothiazide (MICROZIDE) 12.5 MG capsule Take 1  capsule (12.5 mg total) by mouth daily. 10/05/15   Blane Ohara McDiarmid, MD  HYDROcodone-acetaminophen (NORCO) 10-325 MG tablet Take 1 tablet by mouth every 12 (twelve) hours as needed for moderate pain or severe pain. 04/16/16   Lind Covert, MD  lidocaine (XYLOCAINE) 2 % jelly Apply 1 dime size amount of gel to the area between your vagina and rectum once daily for pain. You may use up on 2 times per day if needed 03/12/16   Caren Macadam, MD  lisinopril (PRINIVIL,ZESTRIL) 5 MG tablet Take 1 tablet (5 mg total) by mouth daily. 04/11/16   Mercy Riding, MD  magnesium hydroxide (MILK OF MAGNESIA) 400 MG/5ML suspension Take 30 mLs by mouth daily as needed for mild constipation.    Historical Provider, MD  polyethylene glycol powder (GLYCOLAX/MIRALAX) powder Take 17 g by mouth daily as needed for mild constipation, moderate constipation or severe constipation. 02/14/16   Blane Ohara McDiarmid, MD   BP 87/65 mmHg  Pulse 104  Temp(Src) 98.2 F (36.8 C) (Oral)  Resp 16  Ht 5\' 4"  (1.626 m)  Wt 141 lb (63.957 kg)  BMI 24.19 kg/m2  SpO2 100%  LMP 12/17/1962 Physical Exam  Constitutional: She is oriented to person, place, and time. She appears well-developed and well-nourished. No distress.  HENT:  Head: Normocephalic and atraumatic.  Moist mucous membranes, pale lips and tongue  Eyes: Pupils are equal, round, and reactive to light. Right eye  exhibits discharge. Left eye exhibits no discharge.  Neck: Neck supple.  Cardiovascular: Normal rate, regular rhythm and normal heart sounds.   No murmur heard. Pulmonary/Chest: Effort normal and breath sounds normal.  Abdominal: Soft. Bowel sounds are normal. She exhibits no distension. There is tenderness. There is no rebound and no guarding.  Mild suprapubic TTP  Genitourinary:  Normal rectal tone, no gross blood or melena; Chaperone present throughout entire exam.    Musculoskeletal: She exhibits no edema.  Neurological: She is alert and oriented to person, place, and time.  Fluent speech  Skin: Skin is warm and dry. There is pallor.  Psychiatric: She has a normal mood and affect. Judgment normal.  Nursing note and vitals reviewed.   ED Course  .Critical Care Performed by: Sharlett Iles Authorized by: Sharlett Iles Total critical care time: 45 minutes Critical care time was exclusive of separately billable procedures and treating other patients. Critical care was necessary to treat or prevent imminent or life-threatening deterioration of the following conditions: circulatory failure. Critical care was time spent personally by me on the following activities: development of treatment plan with patient or surrogate, evaluation of patient's response to treatment, examination of patient, obtaining history from patient or surrogate, ordering and performing treatments and interventions, ordering and review of laboratory studies, ordering and review of radiographic studies, re-evaluation of patient's condition and review of old charts.   (including critical care time) DIAGNOSTIC STUDIES: Oxygen Saturation is 100% on RA, normal by my interpretation.    COORDINATION OF CARE: 11:42 PM Discussed treatment plan with pt at bedside which includes lab work, CXR, EKG, hospital admission and pt agreed to plan.   Labs Review Labs Reviewed  COMPREHENSIVE METABOLIC PANEL - Abnormal;  Notable for the following:    CO2 20 (*)    Glucose, Bld 144 (*)    BUN 33 (*)    Creatinine, Ser 1.06 (*)    Calcium 8.0 (*)    Total Protein 5.3 (*)    Albumin 2.9 (*)  Total Bilirubin 0.2 (*)    GFR calc non Af Amer 48 (*)    GFR calc Af Amer 56 (*)    All other components within normal limits  CBC - Abnormal; Notable for the following:    RBC 1.76 (*)    Hemoglobin 5.5 (*)    HCT 17.3 (*)    RDW 23.3 (*)    All other components within normal limits  PROTIME-INR - Abnormal; Notable for the following:    Prothrombin Time 15.6 (*)    All other components within normal limits  POC OCCULT BLOOD, ED - Abnormal; Notable for the following:    Fecal Occult Bld POSITIVE (*)    All other components within normal limits  URINALYSIS, ROUTINE W REFLEX MICROSCOPIC (NOT AT [facility])  I-STAT TROPOININ, ED  TYPE AND SCREEN  PREPARE RBC (CROSSMATCH)    Imaging Review Dg Chest Port 1 View  04/17/2016  CLINICAL DATA:  Pain between the shoulder blades and in the chest for 2 hours. Heart monitor. EXAM: PORTABLE CHEST 1 VIEW COMPARISON:  04/09/2016 FINDINGS: Normal heart size and pulmonary vascularity. No focal airspace disease or consolidation in the lungs. No blunting of costophrenic angles. No pneumothorax. Mediastinal contours appear intact. Degenerative changes in the spine and shoulders. Calcified and tortuous aorta. IMPRESSION: No active disease. Electronically Signed   By: Lucienne Capers M.D.   On: 04/17/2016 00:11   I have personally reviewed and evaluated these lab results as part of my medical decision-making.   EKG Interpretation   Date/Time:  Monday Apr 16 2016 23:18:17 EDT Ventricular Rate:  97 PR Interval:  188 QRS Duration: 72 QT Interval:  362 QTC Calculation: 459 R Axis:   32 Text Interpretation:  Normal sinus rhythm Marked ST abnormality, possible  inferior subendocardial injury Abnormal ECG ST depression diffusely, new  from previous Confirmed by Meriam Chojnowski MD, Khadar Monger  [phone]) on 04/17/2016 1:35:52  AM      Medications  0.9 %  sodium chloride infusion (not administered)     MDM   Final diagnoses:  Gastrointestinal hemorrhage with melena  Chest pain, unspecified chest pain type  AKI (acute kidney injury) ([place])   Pt w/ recent Hospitalization for GI bleed related to angiodysplasia, requiring multiple blood transfusions, presents with chest pain that began this afternoon and has been intermittent as well as low hemoglobin found on repeat lab work today. On exam, the patient was pale but awake and alert, comfortable. She denied any chest pain during my examination. Mild suprapubic tenderness with no rebound or guarding. EKG shows sinus rhythm with ST depression diffusely, particularly in I, II, V4 through V6. Obtained above lab work including type and screen as well as troponin. Held on aspirin due to concern for ongoing GI bleeding.  Labwork showed negative initial troponin, Hemoccult positive stool, mild AK I with creatinine 1.06, hemoglobin 5.5, hematocrit 17.3, INR 1.22. Obtained consent from patient for blood transfusion after discussion of risks and benefits. I have ordered 2u pRBCs. Chest x-ray negative acute. Review of the patient's chart shows that she had chest pain at the last admission and I suspected demand ischemia based on her new EKG changes with negative initial troponin and significantly low hemoglobin. Discussed admission with Caryl Pina with family medicine and patient admitted to Dr. Lennie Odor service for further care.  I personally performed the services described in this documentation, which was scribed in my presence. The recorded information has been reviewed and is accurate.   Sharlett Iles, MD 04/17/16  0235 

## 2016-04-16 NOTE — Assessment & Plan Note (Signed)
Blood pressure low in clinic - Advised holding lisinopril - Recommended checking blood pressure daily and call  clinic for pressures greater than 150/90 - Follow-up 1-2 weeks. Consider restarting lisinopril or HCTZ  if BP increases

## 2016-04-16 NOTE — ED Notes (Signed)
Pt was told to come here for low hemoglobin per family. Pt states she started having central chest pain this after noon. Per family pt had a recent blood transfusion.pt appears pale in triage.

## 2016-04-17 ENCOUNTER — Telehealth: Payer: Self-pay | Admitting: Family Medicine

## 2016-04-17 ENCOUNTER — Encounter (HOSPITAL_COMMUNITY): Payer: Self-pay | Admitting: *Deleted

## 2016-04-17 DIAGNOSIS — B9620 Unspecified Escherichia coli [E. coli] as the cause of diseases classified elsewhere: Secondary | ICD-10-CM | POA: Diagnosis not present

## 2016-04-17 DIAGNOSIS — D62 Acute posthemorrhagic anemia: Secondary | ICD-10-CM

## 2016-04-17 DIAGNOSIS — K922 Gastrointestinal hemorrhage, unspecified: Secondary | ICD-10-CM | POA: Diagnosis present

## 2016-04-17 DIAGNOSIS — Q2730 Arteriovenous malformation, site unspecified: Secondary | ICD-10-CM | POA: Diagnosis not present

## 2016-04-17 DIAGNOSIS — D649 Anemia, unspecified: Secondary | ICD-10-CM | POA: Diagnosis not present

## 2016-04-17 DIAGNOSIS — I10 Essential (primary) hypertension: Secondary | ICD-10-CM | POA: Diagnosis not present

## 2016-04-17 DIAGNOSIS — K274 Chronic or unspecified peptic ulcer, site unspecified, with hemorrhage: Secondary | ICD-10-CM | POA: Diagnosis not present

## 2016-04-17 DIAGNOSIS — H109 Unspecified conjunctivitis: Secondary | ICD-10-CM | POA: Diagnosis not present

## 2016-04-17 DIAGNOSIS — R011 Cardiac murmur, unspecified: Secondary | ICD-10-CM | POA: Diagnosis not present

## 2016-04-17 DIAGNOSIS — I248 Other forms of acute ischemic heart disease: Secondary | ICD-10-CM | POA: Diagnosis not present

## 2016-04-17 DIAGNOSIS — N319 Neuromuscular dysfunction of bladder, unspecified: Secondary | ICD-10-CM | POA: Diagnosis not present

## 2016-04-17 DIAGNOSIS — R9431 Abnormal electrocardiogram [ECG] [EKG]: Secondary | ICD-10-CM

## 2016-04-17 DIAGNOSIS — K259 Gastric ulcer, unspecified as acute or chronic, without hemorrhage or perforation: Secondary | ICD-10-CM | POA: Diagnosis not present

## 2016-04-17 DIAGNOSIS — M1990 Unspecified osteoarthritis, unspecified site: Secondary | ICD-10-CM | POA: Diagnosis not present

## 2016-04-17 DIAGNOSIS — K264 Chronic or unspecified duodenal ulcer with hemorrhage: Secondary | ICD-10-CM | POA: Diagnosis not present

## 2016-04-17 DIAGNOSIS — K921 Melena: Secondary | ICD-10-CM

## 2016-04-17 DIAGNOSIS — K558 Other vascular disorders of intestine: Secondary | ICD-10-CM

## 2016-04-17 DIAGNOSIS — N179 Acute kidney failure, unspecified: Secondary | ICD-10-CM | POA: Diagnosis not present

## 2016-04-17 DIAGNOSIS — K6381 Dieulafoy lesion of intestine: Secondary | ICD-10-CM | POA: Diagnosis not present

## 2016-04-17 DIAGNOSIS — K2971 Gastritis, unspecified, with bleeding: Secondary | ICD-10-CM | POA: Diagnosis not present

## 2016-04-17 DIAGNOSIS — K31811 Angiodysplasia of stomach and duodenum with bleeding: Secondary | ICD-10-CM | POA: Diagnosis not present

## 2016-04-17 DIAGNOSIS — D500 Iron deficiency anemia secondary to blood loss (chronic): Secondary | ICD-10-CM | POA: Diagnosis not present

## 2016-04-17 DIAGNOSIS — K5900 Constipation, unspecified: Secondary | ICD-10-CM | POA: Diagnosis not present

## 2016-04-17 DIAGNOSIS — R079 Chest pain, unspecified: Secondary | ICD-10-CM | POA: Diagnosis not present

## 2016-04-17 DIAGNOSIS — M19079 Primary osteoarthritis, unspecified ankle and foot: Secondary | ICD-10-CM | POA: Diagnosis not present

## 2016-04-17 DIAGNOSIS — Q2733 Arteriovenous malformation of digestive system vessel: Secondary | ICD-10-CM | POA: Diagnosis not present

## 2016-04-17 DIAGNOSIS — R933 Abnormal findings on diagnostic imaging of other parts of digestive tract: Secondary | ICD-10-CM | POA: Diagnosis not present

## 2016-04-17 DIAGNOSIS — H903 Sensorineural hearing loss, bilateral: Secondary | ICD-10-CM | POA: Diagnosis not present

## 2016-04-17 DIAGNOSIS — N189 Chronic kidney disease, unspecified: Secondary | ICD-10-CM | POA: Diagnosis not present

## 2016-04-17 DIAGNOSIS — K5521 Angiodysplasia of colon with hemorrhage: Secondary | ICD-10-CM | POA: Diagnosis not present

## 2016-04-17 DIAGNOSIS — I129 Hypertensive chronic kidney disease with stage 1 through stage 4 chronic kidney disease, or unspecified chronic kidney disease: Secondary | ICD-10-CM | POA: Diagnosis not present

## 2016-04-17 DIAGNOSIS — R509 Fever, unspecified: Secondary | ICD-10-CM | POA: Diagnosis not present

## 2016-04-17 DIAGNOSIS — Z87891 Personal history of nicotine dependence: Secondary | ICD-10-CM | POA: Diagnosis not present

## 2016-04-17 DIAGNOSIS — N390 Urinary tract infection, site not specified: Secondary | ICD-10-CM | POA: Diagnosis not present

## 2016-04-17 DIAGNOSIS — Z66 Do not resuscitate: Secondary | ICD-10-CM | POA: Diagnosis not present

## 2016-04-17 DIAGNOSIS — E785 Hyperlipidemia, unspecified: Secondary | ICD-10-CM | POA: Diagnosis not present

## 2016-04-17 HISTORY — DX: Acute kidney failure, unspecified: N17.9

## 2016-04-17 HISTORY — DX: Abnormal electrocardiogram (ECG) (EKG): R94.31

## 2016-04-17 HISTORY — DX: Gastrointestinal hemorrhage, unspecified: K92.2

## 2016-04-17 LAB — COMPREHENSIVE METABOLIC PANEL
ALT: 15 U/L (ref 14–54)
ANION GAP: 11 (ref 5–15)
AST: 21 U/L (ref 15–41)
Albumin: 2.9 g/dL — ABNORMAL LOW (ref 3.5–5.0)
Alkaline Phosphatase: 39 U/L (ref 38–126)
BILIRUBIN TOTAL: 0.2 mg/dL — AB (ref 0.3–1.2)
BUN: 33 mg/dL — AB (ref 6–20)
CO2: 20 mmol/L — ABNORMAL LOW (ref 22–32)
Calcium: 8 mg/dL — ABNORMAL LOW (ref 8.9–10.3)
Chloride: 106 mmol/L (ref 101–111)
Creatinine, Ser: 1.06 mg/dL — ABNORMAL HIGH (ref 0.44–1.00)
GFR, EST AFRICAN AMERICAN: 56 mL/min — AB (ref >60)
GFR, EST NON AFRICAN AMERICAN: 48 mL/min — AB (ref >60)
Glucose, Bld: 144 mg/dL — ABNORMAL HIGH (ref 65–99)
POTASSIUM: 4 mmol/L (ref 3.5–5.1)
Sodium: 137 mmol/L (ref 135–145)
TOTAL PROTEIN: 5.3 g/dL — AB (ref 6.5–8.1)

## 2016-04-17 LAB — BASIC METABOLIC PANEL
ANION GAP: 10 (ref 5–15)
BUN: 29 mg/dL — ABNORMAL HIGH (ref 6–20)
CO2: 17 mmol/L — ABNORMAL LOW (ref 22–32)
Calcium: 7.9 mg/dL — ABNORMAL LOW (ref 8.9–10.3)
Chloride: 112 mmol/L — ABNORMAL HIGH (ref 101–111)
Creatinine, Ser: 0.92 mg/dL (ref 0.44–1.00)
GFR calc Af Amer: >60 mL/min (ref >60)
GFR, EST NON AFRICAN AMERICAN: 57 mL/min — AB (ref >60)
GLUCOSE: 88 mg/dL (ref 65–99)
POTASSIUM: 4.5 mmol/L (ref 3.5–5.1)
SODIUM: 139 mmol/L (ref 135–145)

## 2016-04-17 LAB — BASIC METABOLIC PANEL WITH GFR
BUN: 30 mg/dL — AB (ref 7–25)
CHLORIDE: 107 mmol/L (ref 98–110)
CO2: 22 mmol/L (ref 20–31)
CREATININE: 0.78 mg/dL (ref 0.60–0.88)
Calcium: 8 mg/dL — ABNORMAL LOW (ref 8.6–10.4)
GFR, Est African American: 83 mL/min (ref >=60)
GFR, Est Non African American: 72 mL/min (ref >=60)
GLUCOSE: 99 mg/dL (ref 65–99)
POTASSIUM: 4.3 mmol/L (ref 3.5–5.3)
Sodium: 138 mmol/L (ref 135–146)

## 2016-04-17 LAB — URINALYSIS, ROUTINE W REFLEX MICROSCOPIC
Bilirubin Urine: NEGATIVE
GLUCOSE, UA: NEGATIVE mg/dL
HGB URINE DIPSTICK: NEGATIVE
Ketones, ur: NEGATIVE mg/dL
Nitrite: POSITIVE — AB
PROTEIN: NEGATIVE mg/dL
SPECIFIC GRAVITY, URINE: 1.012 (ref 1.005–1.030)
pH: 5 (ref 5.0–8.0)

## 2016-04-17 LAB — CBC
HCT: 26.2 % — ABNORMAL LOW (ref 36.0–46.0)
HEMATOCRIT: 17.3 % — AB (ref 36.0–46.0)
HEMATOCRIT: 25.6 % — AB (ref 36.0–46.0)
HEMOGLOBIN: 5.5 g/dL — AB (ref 12.0–15.0)
HEMOGLOBIN: 8.4 g/dL — AB (ref 12.0–15.0)
Hemoglobin: 8.8 g/dL — ABNORMAL LOW (ref 12.0–15.0)
MCH: 31.3 pg (ref 26.0–34.0)
MCH: 29.2 pg (ref 26.0–34.0)
MCH: 31.2 pg (ref 26.0–34.0)
MCHC: 31.8 g/dL (ref 30.0–36.0)
MCHC: 32.8 g/dL (ref 30.0–36.0)
MCHC: 33.6 g/dL (ref 30.0–36.0)
MCV: 98.3 fL (ref 78.0–100.0)
MCV: 88.9 fL (ref 78.0–100.0)
MCV: 92.9 fL (ref 78.0–100.0)
PLATELETS: 160 10*3/uL (ref 150–400)
Platelets: 203 10*3/uL (ref 150–400)
Platelets: 81 10*3/uL — ABNORMAL LOW (ref 150–400)
RBC: 1.76 MIL/uL — AB (ref 3.87–5.11)
RBC: 2.88 MIL/uL — AB (ref 3.87–5.11)
RBC: 2.82 MIL/uL — ABNORMAL LOW (ref 3.87–5.11)
RDW: 23.3 % — AB (ref 11.5–15.5)
RDW: 18.4 % — ABNORMAL HIGH (ref 11.5–15.5)
RDW: 19.7 % — AB (ref 11.5–15.5)
WBC: 8.2 10*3/uL (ref 4.0–10.5)
WBC: 7.3 10*3/uL (ref 4.0–10.5)
WBC: 7.6 10*3/uL (ref 4.0–10.5)

## 2016-04-17 LAB — MRSA PCR SCREENING: MRSA BY PCR: NEGATIVE

## 2016-04-17 LAB — I-STAT TROPONIN, ED: Troponin i, poc: 0.01 ng/mL (ref 0.00–0.08)

## 2016-04-17 LAB — URINE MICROSCOPIC-ADD ON

## 2016-04-17 LAB — PROTIME-INR
INR: 1.22 (ref 0.00–1.49)
PROTHROMBIN TIME: 15.6 s — AB (ref 11.6–15.2)

## 2016-04-17 LAB — POC OCCULT BLOOD, ED: FECAL OCCULT BLD: POSITIVE — AB

## 2016-04-17 LAB — TROPONIN I
TROPONIN I: 0.03 ng/mL (ref <0.031)
Troponin I: 0.03 ng/mL (ref <0.031)

## 2016-04-17 MED ORDER — HYDROCODONE-ACETAMINOPHEN 5-325 MG PO TABS
1.0000 | ORAL_TABLET | Freq: Four times a day (QID) | ORAL | Status: DC | PRN
  Administered 2016-04-17 – 2016-04-20 (×10): 1 via ORAL
  Filled 2016-04-17 (×10): qty 1

## 2016-04-17 MED ORDER — ONDANSETRON HCL 4 MG/2ML IJ SOLN
4.0000 mg | Freq: Four times a day (QID) | INTRAMUSCULAR | Status: DC | PRN
  Administered 2016-04-18: 4 mg via INTRAVENOUS
  Filled 2016-04-17 (×2): qty 2

## 2016-04-17 MED ORDER — ONDANSETRON HCL 4 MG PO TABS: 4.0000 mg | ORAL_TABLET | Freq: Four times a day (QID) | ORAL | Status: DC | PRN

## 2016-04-17 MED ORDER — POLYETHYLENE GLYCOL 3350 17 G PO PACK
17.0000 g | PACK | Freq: Every day | ORAL | Status: DC | PRN
  Administered 2016-04-20 – 2016-05-01 (×2): 17 g via ORAL
  Filled 2016-04-17 (×2): qty 1

## 2016-04-17 MED ORDER — SODIUM CHLORIDE 0.9 % IV SOLN
10.0000 mL/h | Freq: Once | INTRAVENOUS | Status: AC
  Administered 2016-04-17: 10 mL/h via INTRAVENOUS

## 2016-04-17 MED ORDER — SODIUM CHLORIDE 0.9% FLUSH
3.0000 mL | Freq: Two times a day (BID) | INTRAVENOUS | Status: DC
  Administered 2016-04-17 – 2016-05-02 (×28): 3 mL via INTRAVENOUS

## 2016-04-17 MED ORDER — HYDROCODONE-ACETAMINOPHEN 5-325 MG PO TABS
1.0000 | ORAL_TABLET | Freq: Three times a day (TID) | ORAL | Status: DC | PRN
  Administered 2016-04-17: 1 via ORAL
  Filled 2016-04-17 (×2): qty 1

## 2016-04-17 MED ORDER — ACETAMINOPHEN 325 MG PO TABS: 650.0000 mg | ORAL_TABLET | Freq: Four times a day (QID) | ORAL | Status: DC | PRN

## 2016-04-17 MED ORDER — ERYTHROMYCIN 5 MG/GM OP OINT
TOPICAL_OINTMENT | Freq: Four times a day (QID) | OPHTHALMIC | Status: DC
  Administered 2016-04-17 – 2016-04-18 (×4): via OPHTHALMIC
  Administered 2016-04-18 (×2): 1 via OPHTHALMIC
  Administered 2016-04-19 (×4): via OPHTHALMIC
  Administered 2016-04-20: 1 via OPHTHALMIC
  Administered 2016-04-20: 05:00:00 via OPHTHALMIC
  Administered 2016-04-20: 1 via OPHTHALMIC
  Administered 2016-04-21 (×2): via OPHTHALMIC
  Administered 2016-04-21: 1 via OPHTHALMIC
  Administered 2016-04-21 – 2016-04-25 (×12): via OPHTHALMIC
  Administered 2016-04-25 (×2): 1 via OPHTHALMIC
  Filled 2016-04-17 (×2): qty 3.5

## 2016-04-17 MED ORDER — DEXTROSE 5 % IV SOLN
1.0000 g | INTRAVENOUS | Status: DC
  Administered 2016-04-17 – 2016-04-19 (×3): 1 g via INTRAVENOUS
  Filled 2016-04-17 (×3): qty 10

## 2016-04-17 MED ORDER — ALUM & MAG HYDROXIDE-SIMETH 200-200-20 MG/5ML PO SUSP
15.0000 mL | ORAL | Status: DC | PRN
  Administered 2016-04-17 – 2016-04-25 (×3): 15 mL via ORAL
  Filled 2016-04-17 (×3): qty 30

## 2016-04-17 MED ORDER — CETYLPYRIDINIUM CHLORIDE 0.05 % MT LIQD
7.0000 mL | Freq: Two times a day (BID) | OROMUCOSAL | Status: DC
  Administered 2016-04-17 – 2016-05-01 (×27): 7 mL via OROMUCOSAL

## 2016-04-17 MED ORDER — MAGNESIUM HYDROXIDE 400 MG/5ML PO SUSP
30.0000 mL | Freq: Every day | ORAL | Status: DC | PRN
  Administered 2016-04-22: 30 mL via ORAL
  Filled 2016-04-17 (×2): qty 30

## 2016-04-17 MED ORDER — PANTOPRAZOLE SODIUM 40 MG PO TBEC
40.0000 mg | DELAYED_RELEASE_TABLET | Freq: Every day | ORAL | Status: DC
  Administered 2016-04-17 – 2016-04-18 (×2): 40 mg via ORAL
  Filled 2016-04-17 (×2): qty 1

## 2016-04-17 MED ORDER — ACETAMINOPHEN 650 MG RE SUPP: 650.0000 mg | Freq: Four times a day (QID) | RECTAL | Status: DC | PRN

## 2016-04-17 MED ORDER — SODIUM CHLORIDE 0.9 % IV SOLN
80.0000 mg | Freq: Two times a day (BID) | INTRAVENOUS | Status: DC
  Administered 2016-04-17: 80 mg via INTRAVENOUS
  Filled 2016-04-17 (×2): qty 80

## 2016-04-17 NOTE — Telephone Encounter (Signed)
Patient currently in  [name]

## 2016-04-17 NOTE — Telephone Encounter (Signed)
P Sree called from [place] and is very concerned about the patient NOT taking her medications. According to him she said she is only on 1 medication and know that not to be true. He said the patient is very tired, weak, and he feels that since she is not taking her medications this could be why. He is requesting PT for the patient and also home nurse to help manage the patients medications. Please call him with verbal orders at [phone]. Blima Rich

## 2016-04-17 NOTE — Plan of Care (Signed)
Problem: Safety: Goal: Ability to remain free from injury will improve Outcome: Progressing Discussed hospital and department procedures and policies

## 2016-04-17 NOTE — Consult Note (Signed)
CARDIOLOGY CONSULT NOTE   Patient ID: Nicole Perez MRN: [id number] DOB/AGE: [birth year] 82 y.o.  Admit date: 04/16/2016  Requesting Physician: Dr. Ardelia Mems Primary Physician:   MCDIARMID,TODD D, MD Primary Cardiologist:  Dr .Debara Pickett (seen in consult during 02/2016 admission)  Reason for Consultation:   Abnormal ECG.   HPI: Nicole Perez is a 82 y.o. female with a history of HTN, bradycardia on diltiazem XR, recurrent GI bleeds 2/2 angiodysplasia s/p argon plasma coagulation (04/10/16) and no past cardiac history who presented to [facility] on 04/16/16 with chest pain, SOB and abdominal pain and found to be profoundly anemic. Cardiology is consulted for an "abnormal ECG."  She was recently admitted to [facility] from 4/24-4/27/17. She initially presented with chest pain and dyspnea. On admission she was noted to be markedly bradycardic with a junctional rhythm and rate in the 30s, hypotensive and anemic with hemoglobin of 6. She received 3 U PRBCS and feraheme. Patient had an EGD done by GI that revealed angiodysplasia as likely cause of her bleeding. She had argon plasma coagulation for six angiodysplastic lesions in her duodenum, each about 2 mm in size. She was felt to have symptomatic anemia because her symptoms resolved with transfusion. She rule out for MI with serial negative troponin, although one did come back 0.04. ECG with no acute ST or TW changes. 2D ECHO 04/11/16 showed EF 65-70%, G1DD and mild LAE. Her bradycardia improved off home diltiazem XR (home BP medication). During her admission she was noted to have HRs in 80s with frequent sinus pauses off diltiazem. She was started on Lisinopril for HTN and discharged with plans for a 30 day event monitor.   She did pick up her monitor on 04/13/16.   She then returned to [facility] on 04/16/16 with chest pain, abdominal pain and dyspnea. She also had weakness and presyncope. Her hemoglobin was noted to be 5.5. She has been transfused 2U PRBC. Her hemoglobin  improved up to 8.4. GI has evaluated today and plan for enteroscopy tomorrow AM. Troponin neg x1. ECG on 04/16/16 with diffuse ST depression, which was new from previous. Repeat ECG today show improvement of ST depression and dropped sinus beat.   Currently she is feeling much better with no complaints other than foot cramps. No CP, SOB, orthopnea, LE edema, orthopnea or PND. No more dizziness or presyncope.   Past Medical History  Diagnosis Date  . Essential hypertension, benign 04/18/2010  . Lichen sclerosus et atrophicus of the vulva   . BACK PAIN, CHRONIC 04/18/2010    degenerative spine disease, spinal stenosis throughout spine  . DEGENERATIVE JOINT DISEASE, HIPS 09/11/2007    Multilevel degenerative spine dz and spinal stenosis  . INSOMNIA, CHRONIC 04/18/2010  . Parotid adenoma 1990, 2012    Right parotid, recurrent parotid pleimorphic adenoma.   . Osteoarthritis, multiple sites 08/11/2013  . Cystocele 08/11/2013  . Acute renal failure ([place]) 02/23/2014  . History of pneumonia 07/26/2011  . Rectal fissure 12/16/2013  . UTI (urinary tract infection) 02/22/2014  . 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 Hearing loss sensory, bilateral 07/26/2011    Right >> Left.  Left ear hearing aid b/c work discrimination in       R. ear is very poor. Audiologist-Stephanie Nance at [facility] in [place].  (05/10/2010)   . HYPERLIPIDEMIA 05/10/2010    Qualifier: Diagnosis of  By: McDiarmid MD, Sherren Mocha    .  Incontinence overflow, urine 04/28/2014  . Incomplete emptying of bladder 02/09/2014  . Paresthesia of both feet 08/06/2014  . Orthostatic hypotension 08/06/2014  . Blepharitis 11/16/2014    Diagnosis by optometrist, Renaldo Harrison on exam 11/13/2014  . Dry eye syndrome 11/16/2014  . Glaucoma suspect 11/16/2014  . Blood in stool 12/20/2014  . Cholelithiasis   . Colon polyps 2006. 2016    adenomatous and hyperplaxtic  . H. pylori infection 2016     h pylori erosive gastritis, treated with PPI, antibiotics.   . Hiatal hernia 05/05/2015    Large Hiatal Hernia found on EGD by Dr Hilarie Fredrickson (GI in [place]) in work up of melena and (+) FOBT.  Marland Kitchen Urethral polyp 11/17/2015  . 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  ablated.   . Overflow incontinence 04/28/2014  . Complicated UTI (urinary tract infection) 01/30/2016     Past Surgical History  Procedure Laterality Date  . Total abdominal hysterectomy w/ bilateral salpingoophorectomy  1964    Hysterectomy and bilateral oopherectomy at age 87 for benign reasons  . Bladder suspension      Bladder tack x 2 (Dr Janice Norrie)  . Cystocele repair      Rectal prolapse and cyctocele adter hysterectomy requiring anterior repair Jerilynn Mages Edwinna Areola, MD)  . Rectocele repair      Rectal prolapse and cyctocele adter hysterectomy requiring anterior repair Jerilynn Mages Edwinna Areola, MD)  . Urethral dilation    . Parotid gland tumor excision  1990    S/P excision of Right Parotid Gland Benign Tumor, 1990  . Reconstruction of nose  1994    Nasal bridge reconstruction (Dr Judie Grieve, 1994) for following  Forklift accident on job. Surgery complicated by nerve damage resulting in difficulty raising right eyebrow   . Carpal tunnel release  2010    Carpel Tunnel Release of  left wrist  03/2009 (Dr Fredna Dow): Nerve   . Cataract extraction w/ intraocular lens implant  2010    Dr Charise Killian (ophth)  . Breast lumpectomy      Lumpectomy of benign Breast lumps bilaterally, Dr Bubba Camp  . Laminectomy      S/P L4-5 Laminectomy (1987) for decompression of spinal stenosis  . Colonoscopy w/ polypectomy  2006  . Parotidectomy  08/30/11    Radene Journey, MD (ENT) for recurrent right parotid pleomorphic adenoma by frozen section  . Carpal tunnel release      right wrist  . Esophagogastroduodenoscopy N/A 04/10/2016    Procedure: ESOPHAGOGASTRODUODENOSCOPY (EGD);  Surgeon: Irene Shipper, MD;  Location: [facility] ENDOSCOPY;  Service: Endoscopy;  Laterality:  N/A;    Allergies  Allergen Reactions  . Nitrofurantoin Other (See Comments)    Malaise and profound fatigue     I have reviewed the patient's current medications . antiseptic oral rinse  7 mL Mouth Rinse BID  . cefTRIAXone (ROCEPHIN)  IV  1 g Intravenous Q24H  . erythromycin   Both Eyes Q6H  . pantoprazole  40 mg Oral Q0600  . sodium chloride flush  3 mL Intravenous Q12H     acetaminophen **OR** acetaminophen, alum & mag hydroxide-simeth, HYDROcodone-acetaminophen, magnesium hydroxide, ondansetron **OR** ondansetron (ZOFRAN) IV, polyethylene glycol  Prior to Admission medications   Medication Sig Start Date End Date Taking? Authorizing Provider  esomeprazole (NEXIUM) 40 MG capsule Take 1 capsule (40 mg total) by mouth daily. 04/16/16  Yes Olam Idler, MD  ferrous sulfate 325 (65 FE) MG tablet Take 1 tablet (325 mg total) by mouth  2 (two) times daily with a meal. 04/11/16  Yes Mercy Riding, MD  HYDROcodone-acetaminophen (NORCO) 10-325 MG tablet Take 1 tablet by mouth every 12 (twelve) hours as needed for moderate pain or severe pain. Patient taking differently: Take 1 tablet by mouth every 8 (eight) hours as needed for moderate pain or severe pain.  04/16/16  Yes Lind Covert, MD  lidocaine (XYLOCAINE) 2 % jelly Apply 1 dime size amount of gel to the area between your vagina and rectum once daily for pain. You may use up on 2 times per day if needed 03/12/16  Yes Caren Macadam, MD  magnesium hydroxide (MILK OF MAGNESIA) 400 MG/5ML suspension Take 30 mLs by mouth daily as needed for mild constipation.   Yes Historical Provider, MD  polyethylene glycol powder (GLYCOLAX/MIRALAX) powder Take 17 g by mouth daily as needed for mild constipation, moderate constipation or severe constipation. 02/14/16  Yes Blane Ohara McDiarmid, MD     Social History   Social History  . Marital Status: Widowed    Spouse Name: N/A  . Number of Children: N/A  . Years of Education: N/A    Occupational History  . retired    Social History Main Topics  . Smoking status: Former Smoker    Quit date: 01/14/1984  . Smokeless tobacco: Former Systems developer  . Alcohol Use: No  . Drug Use: No  . Sexual Activity: No     Comment: hysterectomy   Other Topics Concern  . Not on file   Social History Narrative   Has 8 children   [name], [name], involved in her care   4 brothers, 5 sisters, 3 deceased siblings   Cares for her great-grandchildren during the day   Widowed in 2009   Lives alone.   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              Family Status  Relation Status Death Age  . Mother Deceased   . Father Deceased    Family History  Problem Relation Age of Onset  . Coronary artery disease Mother   . Coronary artery disease Sister     open heart surgery  . Diabetes type II Sister   . Stroke Father 49  . Hypertension Father   . Lupus Brother   . Heart disease Brother   . Heart attack Sister      ROS:  Full 14 point review of systems complete and found to be negative unless listed above.  Physical Exam: Blood pressure 124/53, pulse 82, temperature 97.6 F (36.4 C), temperature source Oral, resp. rate 13, height 4\' 11"  (1.499 m), weight 141 lb 1.5 oz (64 kg), last menstrual period 12/17/1962, SpO2 100 %.  General: Well developed, well nourished, female in no acute distress Head: Eyes PERRLA, No xanthomas.   Normocephalic and atraumatic, oropharynx without edema or exudate. Dentition:  Lungs: CTAB Heart: HRRR S1 S2, no rub/gallop, Heart regular rate and rhythm with S1, S2  murmur. pulses are 2+ extrem.   Neck: No carotid bruits. No lymphadenopathy. No JVD. Abdomen: Bowel sounds present, abdomen soft and non-tender without masses or hernias noted. Msk:  No spine or cva tenderness. No weakness,  no joint deformities or effusions. Extremities: No clubbing  or cyanosis. No edema.  Neuro: Alert and oriented X 3. No focal deficits noted. Psych:  Good affect, responds appropriately Skin: No rashes or lesions noted.  Labs:   Lab Results  Component Value Date   WBC 7.3 04/17/2016   HGB 8.4* 04/17/2016   HCT 25.6* 04/17/2016   MCV 88.9 04/17/2016   PLT 81* 04/17/2016    Recent Labs  04/16/16 2324  INR 1.22    Recent Labs Lab 04/16/16 2324 04/17/16 0901  NA 137 139  K 4.0 4.5  CL 106 112*  CO2 20* 17*  BUN 33* 29*  CREATININE 1.06* 0.92  CALCIUM 8.0* 7.9*  PROT 5.3*  --   BILITOT 0.2*  --   ALKPHOS 39  --   ALT 15  --   AST 21  --   GLUCOSE 144* 88  ALBUMIN 2.9*  --    No results found for: MG  Recent Labs  04/17/16 0901  TROPONINI 0.03    Recent Labs  04/16/16 2353  TROPIPOC 0.01   No results found for: PROBNP Lab Results  Component Value Date   CHOL 108 04/09/2016   HDL 27* 04/09/2016   LDLCALC 59 04/09/2016   TRIG 112 04/09/2016   No results found for: DDIMER No results found for: LIPASE, AMYLASE TSH  Date/Time Value Ref Range Status  04/09/2016 09:45 PM 1.491 0.350 - 4.500 uIU/mL Final  10/05/2015 03:04 PM 2.339 0.350 - 4.500 uIU/mL Final    Echo: 04/11/2016 LV EF: 65% - 70% Study Conclusions - Left ventricle: The cavity size was normal. There was mild  concentric hypertrophy. Systolic function was vigorous. The  estimated ejection fraction was in the range of 65% to 70%. Wall  motion was normal; there were no regional wall motion  abnormalities. Doppler parameters are consistent with abnormal  left ventricular relaxation (grade 1 diastolic dysfunction). - Right atrium: The atrium was mildly dilated.   ECG:  See H/P and plan   Radiology:  Dg Chest Port 1 View  04/17/2016  CLINICAL DATA:  Pain between the shoulder blades and in the chest for 2 hours. Heart monitor. EXAM: PORTABLE CHEST 1 VIEW COMPARISON:  04/09/2016  FINDINGS: Normal heart size and pulmonary vascularity. No focal airspace disease or consolidation in the lungs. No blunting of costophrenic angles. No pneumothorax. Mediastinal contours appear intact. Degenerative changes in the spine and shoulders. Calcified and tortuous aorta. IMPRESSION: No active disease. Electronically Signed   By: Lucienne Capers M.D.   On: 04/17/2016 00:11    ASSESSMENT AND PLAN:    Principal Problem:   GI bleed Active Problems:   Essential hypertension, benign   Bladder neurogenous   Symptomatic anemia   Acute blood loss anemia   AKI (acute kidney injury) ([place])   ST segment depression   Nicole Perez is a 82 y.o. female with a history of HTN, bradycardia on diltiazem XR, recurrent GI bleeds 2/2 angiodysplasia s/p argon plasma coagulation (04/10/16) and no past cardiac history who presented to [facility] on 04/16/16 with chest pain, SOB and abdominal pain and found to be profoundly anemic. Cardiology is consulted for an "abnormal ECG."  Abnormal ECG: ECG on 04/16/16 with diffuse ST depression, which was new from previous. Repeat ECG today show improvement of ST depression and dropped sinus beat. Troponin neg x1. When stable from a GI bleeding standpoint, would proceed with nuclear stress test to rule out ischemia.   Bradycardia: resolved off dilt XR on previous admission. However, review of tele  this admission reveals intermittant sinus bradycardia with HR in 30-50s. Currently has 30 day outpatient event monitor at home. Would resume at discharge.   Recurrent GI bleed with profound anemia: Her hemoglobin was noted to be 5.5. She has been transfused 2U PRBC. Her hemoglobin improved up to 8.4. GI has evaluated today and plan for enteroscopy tomorrow AM.     Signed: Eileen Stanford, PA-C 04/17/2016 12:02 PM  Pager [phone]  Co-Sign MD  Patient seen, examined. Available data reviewed. Agree with findings, assessment, and plan as outlined by Angelena Form, PA-C. The  patient is independently interviewed and examined. She is a pleasant elderly woman in no distress. Lungs are clear. Heart is regular rate and rhythm. There are no carotid bruits. There is no abdominal tenderness. The abdomen is nondistended. There is no extremity edema.  Lab, radiographic, and EKG/echo data reviewed. The patient's LV function is normal. She did have symptoms of angina associated with severe anemia (hemoglobin 5.5 mg/dL). Her EKG changes are concerning as she had diffuse and significant ST segment depression in the setting of severe anemia. She now appears asymptomatic from a cardiac perspective after packed red blood cell transfusion and improvement of her hemoglobin. I agree that she should continue with GI evaluation as planned. An outpatient stress test would be reasonable. Treatment options might be limited in the setting of severe anemia and GI bleeding, but if her nuclear scan is high risk then it would still be reasonable to consider cardiac catheterization. He either way, I think it is best to allow her to recover from this hospitalization and perform a nuclear scan as an outpatient unless she develops recurrent anginal symptoms.  Sherren Mocha, M.D. 04/17/2016 4:28 PM

## 2016-04-17 NOTE — H&P (Signed)
[facility] Admission History and Physical Service Pager: [phone]  Patient name: Nicole Perez Medical record number: [id number] Date of birth: [birth year] Age: 82 y.o. Gender: female  Primary Care Provider: MCDIARMID,TODD D, MD Consultants: GI/Cards in am Code Status: DNR (discussed on admission)  Chief Complaint: anemia, epigastric pain  Assessment and Plan: Nicole Perez is a 82 y.o. female presenting with symptomatic anemia. PMH is significant for recent hospitalization for anemia/ GI bleed/ Angiodysplasia, hypertension, neurogenic bladder and OA  # Symptomatic anemia, secondary to GI bleed.  Hgb in office 5.7, in ED 5.5. FOBT positive.  2 units pRBCs ordered in ED. Intermittently hypotensive/bradycardic.  No O2 requirement.  04/09/16 Endoscopy showed several, non-bleeding AVMs in duodenum, all ablated with APC Laser. No ulcers or mucosal injury.  Recommendation was to continue PPI daily adn FeSO4 indefinitely.  Patient had not started PPI until today. - Admit to SDU, under Dr Ardelia Mems - Telemetry - VS per floor protocol - Monitor hgb/ post transfusion CBC - c/s GI in am - Protonix 80mg  IV BID.  - Will need PPI at discharge - Will need to resume FeSO4.  However, reports intolerance to PO (nausea).  Perhaps we can set her up with interval Ferreheme infusions?  # Chest pain: EKG with ST depression in leads I, II, V4 through V6.  This is a change from previous EKG.  Concern for demand ischemia in the setting of severe anemia.  Was ACS r/o by cards last week.  H/o bradycardia.  Per cards avoid BB.  Was sent home with holter monitor. iTrop 0.01. CXR negative - trend troponin - cards consult in am - will avoid ASA in the setting of current GI bleed  # AKI: Cr 1.06 on admission.  Patient notes 1 day history of diarrhea on Sunday.  Cr at discharge 0.78. - blood transfusion as above - follow renal function - If no improvement after blood transfusion, consider  IVFs  # HTN: on lisinopril and HCTZ at home until today, when it was discontinued - Patient will need outpatient BP follow up at discharge - Monitor BP  # Neurogenic bladder: performs self caths at home. No dysuria but has suprapubic TTP on exam. - I &O cath prn - UA ordered  FEN/GI: Clear liquids until seen by GI, Protonix IV BID Prophylaxis: SCDs  Disposition: Place in SDU  History of Present Illness:  Nicole Perez is a 82 y.o. female presenting with GI bleed/ symptomatic anemia.  Patient is accompanied to ED by her 3 daughters.  Patient seen in clinic today for hospital follow up.  She expressed melanotic stool on Sunday.  Labs were obtained during this visit, which revealed a hgb 5.7.  Patient was called at home and told to come to ED for evaluation/ transfusion.  Patient notes that she has not had a BM since Sunday.  She notes that her stool was dark and tarry and loose on Sunday.  She reports that she developed chest pain (points to epigastrium) this evening that was burning in sensation.  Denies associated nausea, vomiting, diaphoresis but notes that when she tries to lay down or breath deeply in it burns.  She reports that the pain comes at random.  Not associated with activity. Additionally, it was noted that patient was not discharged on PPI.  Nexium was started today in office.  She reports that this medication has not helped her symptoms today.  Denies dizziness, palpitations.    Patient notes that  she in and out foley caths herself.  She uses a walker at baseline for ambulation.  Review Of Systems: Per HPI with the following additions: none Otherwise the remainder of the systems were negative.  Patient Active Problem List   Diagnosis Date Noted  . Gastric AVM 04/16/2016  . Pain in the chest   . Melena   . Junctional bradycardia   . Acute blood loss anemia   . Angiodysplasia of duodenum with hemorrhage   . Symptomatic anemia 04/09/2016  . Chest pain 04/09/2016  .  Complicated UTI (urinary tract infection) 01/30/2016  . Gross hematuria 11/17/2015  . History of colonic polyps 05/05/2015  . Hiatal hernia 05/05/2015  . Dry eye syndrome 11/16/2014  . Glaucoma suspect, bilateral 11/16/2014  . Peroneal neuropathy 09/30/2014  . Cervical spondylosis without myelopathy 08/07/2014  . Right leg weakness 08/06/2014  . At risk for falls 08/06/2014  . Memory impairment 08/06/2014  . Peripheral painful sensory neuropathy 08/06/2014  . Bladder neurogenous 02/09/2014  . Osteoarthritis, multiple sites 08/11/2013  . Cystocele 08/11/2013  . Osteoarthritis of both knees 04/22/2013  . Obesity, unspecified 04/22/2013  . Vitamin D deficiency 09/30/2012  . Hearing loss sensory, bilateral 07/26/2011  . Spinal stenosis of thoracic region 07/26/2011  . Spinal stenosis of lumbar region 07/26/2011  . Lichen sclerosus et atrophicus of the vulva   . HYPERLIPIDEMIA 05/10/2010  . Osteoarthritis 04/21/2010  . INSOMNIA, CHRONIC 04/18/2010  . Essential hypertension, benign 04/18/2010  . Chronic back pain 04/18/2010  . Osteoarthritis of both hips 09/11/2007    Past Medical History: Past Medical History  Diagnosis Date  . Essential hypertension, benign 04/18/2010  . Lichen sclerosus et atrophicus of the vulva   . BACK PAIN, CHRONIC 04/18/2010    degenerative spine disease, spinal stenosis throughout spine  . DEGENERATIVE JOINT DISEASE, HIPS 09/11/2007    Multilevel degenerative spine dz and spinal stenosis  . INSOMNIA, CHRONIC 04/18/2010  . Parotid adenoma 1990, 2012    Right parotid, recurrent parotid pleimorphic adenoma.   . Osteoarthritis, multiple sites 08/11/2013  . Cystocele 08/11/2013  . Acute renal failure ([place]) 02/23/2014  . History of pneumonia 07/26/2011  . Rectal fissure 12/16/2013  . UTI (urinary tract infection) 02/22/2014  . 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 Hearing loss sensory, bilateral  07/26/2011    Right >> Left.  Left ear hearing aid b/c work discrimination in       R. ear is very poor. Audiologist-Stephanie Nance at [facility] in [place].  (05/10/2010)   . HYPERLIPIDEMIA 05/10/2010    Qualifier: Diagnosis of  By: McDiarmid MD, Sherren Mocha    . Incontinence overflow, urine 04/28/2014  . Incomplete emptying of bladder 02/09/2014  . Paresthesia of both feet 08/06/2014  . Orthostatic hypotension 08/06/2014  . Blepharitis 11/16/2014    Diagnosis by optometrist, Renaldo Harrison on exam 11/13/2014  . Dry eye syndrome 11/16/2014  . Glaucoma suspect 11/16/2014  . Blood in stool 12/20/2014  . Cholelithiasis   . Colon polyps 2006. 2016    adenomatous and hyperplaxtic  . H. pylori infection 2016    h pylori erosive gastritis, treated with PPI, antibiotics.   . Hiatal hernia 05/05/2015    Large Hiatal Hernia found on EGD by Dr Hilarie Fredrickson (GI in [place]) in work up of melena and (+) FOBT.  Marland Kitchen Urethral polyp 11/17/2015  . Melena 03/2016    several AVMs in duodenum on  EGD.  ablated.   . Overflow incontinence 04/28/2014  . Complicated UTI (urinary tract infection) 01/30/2016    Past Surgical History: Past Surgical History  Procedure Laterality Date  . Total abdominal hysterectomy w/ bilateral salpingoophorectomy  1964    Hysterectomy and bilateral oopherectomy at age 16 for benign reasons  . Bladder suspension      Bladder tack x 2 (Dr Janice Norrie)  . Cystocele repair      Rectal prolapse and cyctocele adter hysterectomy requiring anterior repair Jerilynn Mages Edwinna Areola, MD)  . Rectocele repair      Rectal prolapse and cyctocele adter hysterectomy requiring anterior repair Jerilynn Mages Edwinna Areola, MD)  . Urethral dilation    . Parotid gland tumor excision  1990    S/P excision of Right Parotid Gland Benign Tumor, 1990  . Reconstruction of nose  1994    Nasal bridge reconstruction (Dr Judie Grieve, 1994) for following  Forklift accident on job. Surgery complicated by nerve damage resulting in difficulty raising right  eyebrow   . Carpal tunnel release  2010    Carpel Tunnel Release of  left wrist  03/2009 (Dr Fredna Dow): Nerve   . Cataract extraction w/ intraocular lens implant  2010    Dr Charise Killian (ophth)  . Breast lumpectomy      Lumpectomy of benign Breast lumps bilaterally, Dr Bubba Camp  . Laminectomy      S/P L4-5 Laminectomy (1987) for decompression of spinal stenosis  . Colonoscopy w/ polypectomy  2006  . Parotidectomy  08/30/11    Radene Journey, MD (ENT) for recurrent right parotid pleomorphic adenoma by frozen section  . Carpal tunnel release      right wrist  . Esophagogastroduodenoscopy N/A 04/10/2016    Procedure: ESOPHAGOGASTRODUODENOSCOPY (EGD);  Surgeon: Irene Shipper, MD;  Location[facility];  Service: Endoscopy;  Laterality: N/A;    Social History: Social History  Substance Use Topics  . Smoking status: Former Smoker    Quit date: 01/14/1984  . Smokeless tobacco: Never Used  . Alcohol Use: No   Additional social history: none  Please also refer to relevant sections of EMR.  Family History: Family History  Problem Relation Age of Onset  . Coronary artery disease Mother   . Coronary artery disease Sister     open heart surgery  . Diabetes type II Sister   . Stroke Father 64  . Hypertension Father   . Lupus Brother   . Heart disease Brother   . Heart attack Sister     Allergies and Medications: Allergies  Allergen Reactions  . Nitrofurantoin Other (See Comments)    Malaise and profound fatigue    No current facility-administered medications on file prior to encounter.   Current Outpatient Prescriptions on File Prior to Encounter  Medication Sig Dispense Refill  . esomeprazole (NEXIUM) 40 MG capsule Take 1 capsule (40 mg total) by mouth daily. 30 capsule 2  . ferrous sulfate 325 (65 FE) MG tablet Take 1 tablet (325 mg total) by mouth 2 (two) times daily with a meal. 30 tablet 11  . hydrochlorothiazide (MICROZIDE) 12.5 MG capsule Take 1 capsule (12.5 mg total) by mouth  daily. 45 capsule PRN  . HYDROcodone-acetaminophen (NORCO) 10-325 MG tablet Take 1 tablet by mouth every 12 (twelve) hours as needed for moderate pain or severe pain. 60 tablet 0  . lidocaine (XYLOCAINE) 2 % jelly Apply 1 dime size amount of gel to the area between your vagina and rectum once daily for pain. You may use  up on 2 times per day if needed 85 mL 2  . lisinopril (PRINIVIL,ZESTRIL) 5 MG tablet Take 1 tablet (5 mg total) by mouth daily. 30 tablet 1  . magnesium hydroxide (MILK OF MAGNESIA) 400 MG/5ML suspension Take 30 mLs by mouth daily as needed for mild constipation.    . polyethylene glycol powder (GLYCOLAX/MIRALAX) powder Take 17 g by mouth daily as needed for mild constipation, moderate constipation or severe constipation. 3350 g 3    Objective: BP 87/65 mmHg  Pulse 104  Temp(Src) 98.2 F (36.8 C) (Oral)  Resp 16  Ht 5\' 4"  (1.626 m)  Wt 141 lb (63.957 kg)  BMI 24.19 kg/m2  SpO2 100%  LMP 12/17/1962 Exam: General: awake, alert, pale appearing elderly female, family at bedside Eyes: sclera white, EOMI, pale conjunctiva ENTM: o/p clear, MMM Neck: supple Cardiovascular: RRR, SEM present, +2 pulses Respiratory: CTAB, normal WOB on room air Abdomen: soft, +TTP to suprapubic area, bladder is full, no epigastric TTP MSK: no edema, moves extremities independently Skin: no rashes seen Neuro: follows commands, speech normal Psych: mood stable, affect appropriate, pleasant  Labs and Imaging: CBC BMET   Recent Labs Lab 04/16/16 2324  WBC 8.2  HGB 5.5*  HCT 17.3*  PLT 203    Recent Labs Lab 04/16/16 2324  NA 137  K 4.0  CL 106  CO2 20*  BUN 33*  CREATININE 1.06*  GLUCOSE 144*  CALCIUM 8.0*      Dg Chest Port 1 View  04/17/2016  CLINICAL DATA:  Pain between the shoulder blades and in the chest for 2 hours. Heart monitor. EXAM: PORTABLE CHEST 1 VIEW COMPARISON:  04/09/2016 FINDINGS: Normal heart size and pulmonary vascularity. No focal airspace disease or  consolidation in the lungs. No blunting of costophrenic angles. No pneumothorax. Mediastinal contours appear intact. Degenerative changes in the spine and shoulders. Calcified and tortuous aorta. IMPRESSION: No active disease. Electronically Signed   By: Lucienne Capers M.D.   On: 04/17/2016 00:11   04/17/16: EKG with ST depression in leads I, II, V4 through V6  Janora Norlander, DO 04/17/2016, 1:39 AM PGY-2, [place] Intern pager: [phone], text pages welcome

## 2016-04-17 NOTE — Telephone Encounter (Signed)
Will forward to MD. Jazmin Hartsell,CMA  

## 2016-04-17 NOTE — Consult Note (Signed)
[facility] Consult: 10:27 AM 04/17/2016  LOS: 0 days    Referring Provider: Dr Ardelia Mems  Primary Care Physician:  MCDIARMID,TODD D, MD Primary Gastroenterologist:  Dr. Hilarie Fredrickson     Reason for Consultation:  Recurrent GIB and anemia.   HPI: Nicole Perez is a 82 y.o. female. Hx UTIs and neurogenic bladder, self caths at home. Osteoarthritis. Spinal stenosis. Chronic back pain. Obesity. HTN. Glaucoma. 08/2011 Parotid gland bx: pleomorphic adenoma.  Hx colon polyps and rectal fissure. Hiatal hernia.  Hx recurrent Heme + anemia and melena.  04/11/15 EGD.   Normal esophagus.   Normal stomach. Normal duodenal bulb. Four non-bleeding angiodysplastic lesions in the duodenum. Treated with argon plasma coagulation (APC). Two non-bleeding angiodysplastic lesions in the duodenum. Treated with argon plasma coagulation  ANGIODYSPLASIA AS THE CAUSE FOR HER RECURRENT MELENA Plan: iron sulfate 325 mg twice daily indefinitely.  If patient were to develop recurrent anemia in the future or recurrent bleeding, recommend upper endoscopy with push enteroscopy and ablation 03/2015 Colonoscopy. Dr Hilarie Fredrickson.  INDICATIONS:heme positive stool, constipation, melena, history of hyperplastic colon polyps, rectal prolapse, last colonoscopy 2006. ENDOSCOPIC IMPRESSION: 1. Eight sessile polyps ranging between 3-40mm in size were found in the ascending colon, transverse colon, and descending colon; polypectomies were performed with a cold snare 2. Multiple sessile polyps were found in the rectosigmoid colon 3. Mild diverticulosis was noted in the transverse colon, descending colon, and sigmoid colon Path: tubular adenomas and sessile serrated adenomas.  03/2015 EGD INDICATIONS: melena and heme positive stool. ENDOSCOPIC  IMPRESSION: 1. The mucosa of the esophagus appeared normal 2. Large hiatal hernia 3. Erosive gastritis (inflammation) was found in the entire examined stomach; multiple biopsies were performed 4. The duodenal mucosa showed no abnormalities in the bulb and 2nd part of the duodenum Path: + for H Pylori gastritis. Rx'd with Prevpac vs pylera equivalents. .  02/2005 Colonoscopy. Dr Mellody Memos. Hyperplastic polyps descending colon.  1998 Colonoscopy. Dr Wynetta Emery. Scattered small polyps in rectum and sigmoid.    Admission 04/09/2016- 04/12/16 with intermittent dark stools, Hgb 6.7.  Transfused 3 PRBC. Dr Henrene Pastor performed EGD as above.  Hgb 4/27: 7.6.  Seen by cardiologist for junctional bradycardia.  Diltiazem was discontinued, rate improved.  Plan was for 30 day monitor given normal echo.  After discharge stools were greenish black with the BID iron she was taking.  On 4/30, had reddish black, tarry stools, at least 3.  No nausea, no abd pain.  Some dizziness.  Yesterday som presyncope.  No chest pain.  No stools on 5/2 or today.  Hgb 5.5.  Up to 8.4 after PRBC x 2.   Her complaint is of foot cramps which she has at home.      Past Medical History  Diagnosis Date  . Essential hypertension, benign 04/18/2010  . Lichen sclerosus et atrophicus of the vulva   . BACK PAIN, CHRONIC 04/18/2010    degenerative spine disease, spinal stenosis throughout spine  . DEGENERATIVE JOINT DISEASE, HIPS 09/11/2007    Multilevel degenerative spine dz and spinal stenosis  .  INSOMNIA, CHRONIC 04/18/2010  . Parotid adenoma 1990, 2012    Right parotid, recurrent parotid pleimorphic adenoma.   . Osteoarthritis, multiple sites 08/11/2013  . Cystocele 08/11/2013  . Acute renal failure ([place]) 02/23/2014  . History of pneumonia 07/26/2011  . Rectal fissure 12/16/2013  . UTI (urinary tract infection) 02/22/2014  . 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  Hearing loss sensory, bilateral 07/26/2011    Right >> Left.  Left ear hearing aid b/c work discrimination in       R. ear is very poor. Audiologist-Stephanie Nance at [facility] in [place].  (05/10/2010)   . HYPERLIPIDEMIA 05/10/2010    Qualifier: Diagnosis of  By: McDiarmid MD, Sherren Mocha    . Incontinence overflow, urine 04/28/2014  . Incomplete emptying of bladder 02/09/2014  . Paresthesia of both feet 08/06/2014  . Orthostatic hypotension 08/06/2014  . Blepharitis 11/16/2014    Diagnosis by optometrist, Renaldo Harrison on exam 11/13/2014  . Dry eye syndrome 11/16/2014  . Glaucoma suspect 11/16/2014  . Blood in stool 12/20/2014  . Cholelithiasis   . Colon polyps 2006. 2016    adenomatous and hyperplaxtic  . H. pylori infection 2016    h pylori erosive gastritis, treated with PPI, antibiotics.   . Hiatal hernia 05/05/2015    Large Hiatal Hernia found on EGD by Dr Hilarie Fredrickson (GI in [place]) in work up of melena and (+) FOBT.  Marland Kitchen Urethral polyp 11/17/2015  . 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  ablated.   . Overflow incontinence 04/28/2014  . Complicated UTI (urinary tract infection) 01/30/2016    Past Surgical History  Procedure Laterality Date  . Total abdominal hysterectomy w/ bilateral salpingoophorectomy  1964    Hysterectomy and bilateral oopherectomy at age 13 for benign reasons  . Bladder suspension      Bladder tack x 2 (Dr Janice Norrie)  . Cystocele repair      Rectal prolapse and cyctocele adter hysterectomy requiring anterior repair Jerilynn Mages Edwinna Areola, MD)  . Rectocele repair      Rectal prolapse and cyctocele adter hysterectomy requiring anterior repair Jerilynn Mages Edwinna Areola, MD)  . Urethral dilation    . Parotid gland tumor excision  1990    S/P excision of Right Parotid Gland Benign Tumor, 1990  . Reconstruction of nose  1994    Nasal bridge reconstruction (Dr Judie Grieve, 1994) for following  Forklift accident on job. Surgery complicated by nerve damage resulting in difficulty raising  right eyebrow   . Carpal tunnel release  2010    Carpel Tunnel Release of  left wrist  03/2009 (Dr Fredna Dow): Nerve   . Cataract extraction w/ intraocular lens implant  2010    Dr Charise Killian (ophth)  . Breast lumpectomy      Lumpectomy of benign Breast lumps bilaterally, Dr Bubba Camp  . Laminectomy      S/P L4-5 Laminectomy (1987) for decompression of spinal stenosis  . Colonoscopy w/ polypectomy  2006  . Parotidectomy  08/30/11    Radene Journey, MD (ENT) for recurrent right parotid pleomorphic adenoma by frozen section  . Carpal tunnel release      right wrist  . Esophagogastroduodenoscopy N/A 04/10/2016    Procedure: ESOPHAGOGASTRODUODENOSCOPY (EGD);  Surgeon: Irene Shipper, MD;  Location: [facility] ENDOSCOPY;  Service: Endoscopy;  Laterality: N/A;    Prior to Admission medications   Medication Sig Start Date End Date Taking?  Authorizing Provider  esomeprazole (NEXIUM) 40 MG capsule Take 1 capsule (40 mg total) by mouth daily. 04/16/16  Yes Olam Idler, MD  ferrous sulfate 325 (65 FE) MG tablet Take 1 tablet (325 mg total) by mouth 2 (two) times daily with a meal. 04/11/16  Yes Mercy Riding, MD  HYDROcodone-acetaminophen (NORCO) 10-325 MG tablet Take 1 tablet by mouth every 12 (twelve) hours as needed for moderate pain or severe pain. Patient taking differently: Take 1 tablet by mouth every 8 (eight) hours as needed for moderate pain or severe pain.  04/16/16  Yes Lind Covert, MD  lidocaine (XYLOCAINE) 2 % jelly Apply 1 dime size amount of gel to the area between your vagina and rectum once daily for pain. You may use up on 2 times per day if needed 03/12/16  Yes Caren Macadam, MD  magnesium hydroxide (MILK OF MAGNESIA) 400 MG/5ML suspension Take 30 mLs by mouth daily as needed for mild constipation.   Yes Historical Provider, MD  polyethylene glycol powder (GLYCOLAX/MIRALAX) powder Take 17 g by mouth daily as needed for mild constipation, moderate constipation or severe constipation. 02/14/16  Yes  Blane Ohara McDiarmid, MD    Scheduled Meds: . antiseptic oral rinse  7 mL Mouth Rinse BID  . cefTRIAXone (ROCEPHIN)  IV  1 g Intravenous Q24H  . erythromycin   Both Eyes Q6H  . pantoprazole (PROTONIX) IV  80 mg Intravenous Q12H  . sodium chloride flush  3 mL Intravenous Q12H   Infusions:   PRN Meds: acetaminophen **OR** acetaminophen, alum & mag hydroxide-simeth, HYDROcodone-acetaminophen, magnesium hydroxide, ondansetron **OR** ondansetron (ZOFRAN) IV, polyethylene glycol   Allergies as of 04/16/2016 - Review Complete 04/16/2016  Allergen Reaction Noted  . Nitrofurantoin Other (See Comments) 10/05/2015    Family History  Problem Relation Age of Onset  . Coronary artery disease Mother   . Coronary artery disease Sister     open heart surgery  . Diabetes type II Sister   . Stroke Father 33  . Hypertension Father   . Lupus Brother   . Heart disease Brother   . Heart attack Sister     Social History   Social History  . Marital Status: Widowed    Spouse Name: N/A  . Number of Children: N/A  . Years of Education: N/A   Occupational History  . retired    Social History Main Topics  . Smoking status: Former Smoker    Quit date: 01/14/1984  . Smokeless tobacco: Former Systems developer  . Alcohol Use: No  . Drug Use: No  . Sexual Activity: No     Comment: hysterectomy   Other Topics Concern  . Not on file   Social History Narrative   Has 8 children   [name], [name], involved in her care   4 brothers, 5 sisters, 3 deceased siblings   Cares for her great-grandchildren during the day   Widowed in 2009   Lives alone.   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              REVIEW OF SYSTEMS: Constitutional: Per HPI.  Stable weight.  ENT: No nose bleeds. Wears hearing aid.  Pulm: Per HPI. No  cough CV: No palpitations, no  LE edema. Chest pain resolved GU: normally avoids coffee but drank several cups 11 days ago and this set off her bladder: irritated, burning discomfort, unable to sleep. No blood in urine.  GI: No dysphagia.  Heme: Per HPI. No unusual bleeding or bruising.  Transfusions: None ever before MS: Body pain as per HPI Neuro: No headaches, no peripheral tingling or numbness Derm: No itching, no rash or sores.  Endocrine: No sweats or chills. No polyuria or dysuria Immunization: reviewed Travel: None beyond local counties in last few months.   PHYSICAL EXAM: Vital signs in last 24 hours: Filed Vitals:   04/17/16 0700 04/17/16 0900  BP: 122/50 124/53  Pulse: 74 82  Temp: 97.6 F (36.4 C)   Resp: 16 13   Wt Readings from Last 3 Encounters:  04/17/16 64 kg (141 lb 1.5 oz)  04/16/16 63.957 kg (141 lb)  04/12/16 64.456 kg (142 lb 1.6 oz)   General: Uncomfortable with her foot cramps. not ill looking.  Head: No trauma, no swelling or asymmetry  Eyes: + conj pallor. EOMI.  Ears: Hearing aid in place.  Nose: No discharge or congestion Mouth: Moist, clear. Upper partial/bridge. Lower has only a few incisors. MM pink, clear and moist Neck: No JVD, no TMG, no bruits Lungs: Clear bil. No cough or dyspnea Heart: RRR. No mrg. S1/s2 present Abdomen: Soft, NT. No masses, HSM, bruits, or hernias.  Rectal: deferred. FOBT + dark stool in ED  Musc/Skeltl: no joint deformity, swelling Extremities: No CCE  Neurologic: Oriented x 3. No gross deficits. Moves all 4s. Limb strength not tested.  Skin: No rash or sores Tattoos: none  Psych: affect subdued.  Cooperative. Calm, cooperative.  Intake/Output from previous day: 05/01 0701 - 05/02 0700 In: 1125.8 [I.V.:20.8; Blood:1005; IV Piggyback:100] Out: 900 [Urine:900] Intake/Output this shift:    LAB RESULTS:  Recent Labs  04/16/16 1649 04/16/16 2324  04/17/16 0901  WBC 7.8 8.2 7.3  HGB 5.7* 5.5* 8.4*  HCT 18.1* 17.3* 25.6*  PLT 225 203 81*   BMET Lab Results  Component Value Date   NA 139 04/17/2016   NA 137 04/16/2016   NA 138 04/16/2016   K 4.5 04/17/2016   K 4.0 04/16/2016   K 4.3 04/16/2016   CL 112* 04/17/2016   CL 106 04/16/2016   CL 107 04/16/2016   CO2 17* 04/17/2016   CO2 20* 04/16/2016   CO2 22 04/16/2016   GLUCOSE 88 04/17/2016   GLUCOSE 144* 04/16/2016   GLUCOSE 99 04/16/2016   BUN 29* 04/17/2016   BUN 33* 04/16/2016   BUN 30* 04/16/2016   CREATININE 0.92 04/17/2016   CREATININE 1.06* 04/16/2016   CREATININE 0.78 04/16/2016   CALCIUM 7.9* 04/17/2016   CALCIUM 8.0* 04/16/2016   CALCIUM 8.0* 04/16/2016   LFT  Recent Labs  04/16/16 2324  PROT 5.3*  ALBUMIN 2.9*  AST 21  ALT 15  ALKPHOS 39  BILITOT 0.2*   PT/INR Lab Results  Component Value Date   INR 1.22 04/16/2016   INR 1.25 04/09/2016   INR 1.01 12/18/2014   Hepatitis Panel No results for input(s): HEPBSAG, HCVAB, HEPAIGM, HEPBIGM in the last 72 hours. C-Diff No components found for: CDIFF Lipase  No results found for: LIPASE  Drugs of Abuse  No results found for: LABOPIA, COCAINSCRNUR, LABBENZ, AMPHETMU, THCU, LABBARB   RADIOLOGY STUDIES: Dg Chest Port 1 View  04/17/2016  CLINICAL DATA:  Pain between the shoulder blades and in the chest for 2 hours.  Heart monitor. EXAM: PORTABLE CHEST 1 VIEW COMPARISON:  04/09/2016 FINDINGS: Normal heart size and pulmonary vascularity. No focal airspace disease or consolidation in the lungs. No blunting of costophrenic angles. No pneumothorax. Mediastinal contours appear intact. Degenerative changes in the spine and shoulders. Calcified and tortuous aorta. IMPRESSION: No active disease. Electronically Signed   By: Lucienne Capers M.D.   On: 04/17/2016 00:11    ENDOSCOPIC STUDIES: Per HPI  IMPRESSION:   *  Recurrent GIB and blood loss anemia.  S/p PRBC x 2, recent EGD with ablation SB AVMs  *   UTI. + urinary WBCs, leukocytes, Nitrites, bacteruria.  On Rocephin.   *  Foot cramps  *  Recent inpt eval of junctional bradycardia, resolved with d/c of Cardizem.  Plan was 30 day monitor.     PLAN:     *  Set for AM enteroscopy tomorrow with MAC sedation.  Clears today.    *  Switch to oral Protonix.  AVMs not responsive to PPI.    *   Changed norco from q 8 to q 6 hours prn, so she can get dose now for her foot cramps.    Nicole Perez  04/17/2016, 10:27 AM Pager: [phone]      Attending physician's note   I have taken an interval history, reviewed the chart and examined the patient. I agree with the Advanced Practitioner's note, impression and recommendations. Patient admitted with symptomatic anemia and history of melena. She recently underwent EGD with ablation of AVM in proximal small bowel. Her presentation is concerning for recurrent GI bleed possible secondary to AVM. Will plan for enteroscopy tomorrow for evaluation and management. Also noted diffuse ST depression and brady cardia which was though to be in the setting of diltiazem and has improved since. Cardiology is planning to do nuclear stress test once her GI bleed has resolved.   Damaris Hippo, MD [phone] Mon-Fri 8a-5p [phone] after 5p, weekends, holidays

## 2016-04-18 ENCOUNTER — Encounter (HOSPITAL_COMMUNITY)
Admission: EM | Disposition: A | Discharge status: Other Acute Inpatient Hospital | Payer: Self-pay | Source: Home / Self Care | Attending: Family Medicine

## 2016-04-18 ENCOUNTER — Inpatient Hospital Stay (HOSPITAL_COMMUNITY): Payer: Medicare Other | Admitting: Certified Registered Nurse Anesthetist

## 2016-04-18 ENCOUNTER — Encounter (HOSPITAL_COMMUNITY): Payer: Self-pay

## 2016-04-18 DIAGNOSIS — K269 Duodenal ulcer, unspecified as acute or chronic, without hemorrhage or perforation: Secondary | ICD-10-CM | POA: Insufficient documentation

## 2016-04-18 DIAGNOSIS — K274 Chronic or unspecified peptic ulcer, site unspecified, with hemorrhage: Secondary | ICD-10-CM | POA: Insufficient documentation

## 2016-04-18 DIAGNOSIS — K264 Chronic or unspecified duodenal ulcer with hemorrhage: Secondary | ICD-10-CM

## 2016-04-18 HISTORY — DX: Duodenal ulcer, unspecified as acute or chronic, without hemorrhage or perforation: K26.9

## 2016-04-18 HISTORY — PX: ENTEROSCOPY: SHX5533

## 2016-04-18 LAB — CBC
HCT: 22.2 % — ABNORMAL LOW (ref 36.0–46.0)
Hemoglobin: 7.3 g/dL — ABNORMAL LOW (ref 12.0–15.0)
MCH: 30.8 pg (ref 26.0–34.0)
MCHC: 32.9 g/dL (ref 30.0–36.0)
MCV: 93.7 fL (ref 78.0–100.0)
PLATELETS: 145 10*3/uL — AB (ref 150–400)
RBC: 2.37 MIL/uL — ABNORMAL LOW (ref 3.87–5.11)
RDW: 20.2 % — AB (ref 11.5–15.5)
WBC: 6.6 10*3/uL (ref 4.0–10.5)

## 2016-04-18 SURGERY — ENTEROSCOPY
Anesthesia: Monitor Anesthesia Care

## 2016-04-18 MED ORDER — PANTOPRAZOLE SODIUM 40 MG PO TBEC
40.0000 mg | DELAYED_RELEASE_TABLET | Freq: Two times a day (BID) | ORAL | Status: DC
  Administered 2016-04-18 – 2016-04-30 (×22): 40 mg via ORAL
  Filled 2016-04-18 (×24): qty 1

## 2016-04-18 MED ORDER — PROPOFOL 500 MG/50ML IV EMUL
INTRAVENOUS | Status: DC | PRN
  Administered 2016-04-18: 100 ug/kg/min via INTRAVENOUS

## 2016-04-18 MED ORDER — PHENOL 1.4 % MT LIQD
1.0000 | OROMUCOSAL | Status: DC | PRN
  Filled 2016-04-18 (×2): qty 177

## 2016-04-18 MED ORDER — LIDOCAINE HCL (CARDIAC) 20 MG/ML IV SOLN
INTRAVENOUS | Status: DC | PRN
  Administered 2016-04-18 (×2): 50 mg via INTRATRACHEAL

## 2016-04-18 MED ORDER — LACTATED RINGERS IV SOLN
INTRAVENOUS | Status: DC
  Administered 2016-04-18: 1000 mL via INTRAVENOUS
  Administered 2016-04-18: 11:00:00 via INTRAVENOUS

## 2016-04-18 MED ORDER — SODIUM CHLORIDE 0.9 % IV SOLN
INTRAVENOUS | Status: DC
  Administered 2016-04-18: 08:00:00 via INTRAVENOUS

## 2016-04-18 NOTE — Transfer of Care (Signed)
Immediate Anesthesia Transfer of Care Note  Patient: Nicole Perez  Procedure(s) Performed: Procedure(s): ENTEROSCOPY (N/A)  Patient Location: PACU  Anesthesia Type:MAC  Level of Consciousness: patient cooperative and responds to stimulation  Airway & Oxygen Therapy: Patient Spontanous Breathing and Patient connected to nasal cannula oxygen  Post-op Assessment: Report given to RN and Post -op Vital signs reviewed and stable  Post vital signs: Reviewed and stable  Last Vitals:  Filed Vitals:   04/18/16 1018 04/18/16 1132  BP: 118/46 88/33  Pulse: 51 47  Temp:    Resp: 13 24    Last Pain:  Filed Vitals:   04/18/16 1133  PainSc: Asleep      Patients Stated Pain Goal: 0 ([id number])  Complications: No apparent anesthesia complications

## 2016-04-18 NOTE — Anesthesia Preprocedure Evaluation (Addendum)
Anesthesia Evaluation  Patient identified by MRN, date of birth, ID band Patient awake    Reviewed: Allergy & Precautions, NPO status , Patient's Chart, lab work & pertinent test results  Airway Mallampati: I  TM Distance: >3 FB Neck ROM: Full    Dental  (+) Edentulous Upper, Edentulous Lower   Pulmonary former smoker,    breath sounds clear to auscultation       Cardiovascular hypertension,  Rhythm:Regular Rate:Normal     Neuro/Psych  Neuromuscular disease negative psych ROS   GI/Hepatic hiatal hernia, GERD  Medicated,  Endo/Other  negative endocrine ROS  Renal/GU Renal disease  negative genitourinary   Musculoskeletal  (+) Arthritis ,   Abdominal   Peds negative pediatric ROS (+)  Hematology   Anesthesia Other Findings   Reproductive/Obstetrics negative OB ROS                            Anesthesia Physical Anesthesia Plan  ASA: III  Anesthesia Plan: MAC   Post-op Pain Management:    Induction: Intravenous  Airway Management Planned: Nasal Cannula  Additional Equipment:   Intra-op Plan:   Post-operative Plan:   Informed Consent: I have reviewed the patients History and Physical, chart, labs and discussed the procedure including the risks, benefits and alternatives for the proposed anesthesia with the patient or authorized representative who has indicated his/her understanding and acceptance.     Plan Discussed with: CRNA  Anesthesia Plan Comments:         Anesthesia Quick Evaluation

## 2016-04-18 NOTE — Anesthesia Postprocedure Evaluation (Signed)
Anesthesia Post Note  Patient: Nicole Perez  Procedure(s) Performed: Procedure(s) (LRB): ENTEROSCOPY (N/A)  Patient location during evaluation: Endoscopy Anesthesia Type: MAC Level of consciousness: awake and alert Pain management: pain level controlled Vital Signs Assessment: post-procedure vital signs reviewed and stable Respiratory status: spontaneous breathing, nonlabored ventilation, respiratory function stable and patient connected to nasal cannula oxygen Cardiovascular status: stable and blood pressure returned to baseline Anesthetic complications: no    Last Vitals:  Filed Vitals:   04/18/16 1140 04/18/16 1150  BP: 118/43 132/50  Pulse: 49 50  Temp:    Resp: 19 21    Last Pain:  Filed Vitals:   04/18/16 1200  PainSc: 0-No pain                 [name]

## 2016-04-18 NOTE — Interval H&P Note (Signed)
History and Physical Interval Note:  04/18/2016 10:48 AM  Nicole Perez  has presented today for surgery, with the diagnosis of anemia, melena.  The various methods of treatment have been discussed with the patient and family. After consideration of risks, benefits and other options for treatment, the patient has consented to  Procedure(s): ENTEROSCOPY (N/A) as a surgical intervention .  The patient's history has been reviewed, patient examined, no change in status, stable for surgery.  I have reviewed the patient's chart and labs.  Questions were answered to the patient's satisfaction.     Kavitha Nandigam

## 2016-04-18 NOTE — Discharge Summary (Signed)
[facility] Discharge Summary  Patient name: Nicole Perez Medical record number: [id number] Date of birth: 1933-12-15 Age: 82 y.o. Gender: female Date of Admission: 04/16/2016  Date of Transfer: 05/02/2016 Admitting Physician: Leeanne Rio, MD  Primary Care Provider: MCDIARMID,TODD D, MD Consultants: GI, IR, Surgery, Cardiology  Indication for Hospitalization: symptomatic anemia in the setting of GI bleeding  Discharge Diagnoses/Problem List:  Anemia of blood loss GI bleeding AVM / Angiodysplasia  Hypertension History of Junctional rhythm Osteoarthritis Neurogenic bladder Cystocele  Disposition: Transfer   Discharge Condition: Stable  Discharge Exam:  Gen: NAD, resting in bed CV: RRR, normal S1/2, no murmurs, no edema.  Resp: CTA BL, appropriate rate, unlabored.  Abd: S, mild epigastric TTP with deep palpation, otherwise normal BS. No distension, no organomegaly palpable.  Ext: WWP, 2+ distal pulses.   Brief Hospital Course:  Nicole Perez is a 82 y.o. female with history of anemia, GI bleed, Known Angiodysplasia of the small bowel, hypertension, neurogenic bladder who was admitted with symptomatic anemia in the setting of GI bleed in addition to unstable angina and diffuse ST depression. This was a recurrent hospitalization due to GI Bleeding. Prior hospitalization 4/24-27.   Symptomatic anemia secondary to GI bleed: Patient returned to the ED with melanotic stool. FOBT positive andHgb 5.5 on arrival. She received 2 units pRBCs and hemoglobin initially trended up to 8.4 then, dropped to 7.3. Endoscopy on 04/10/16 (previous hospitalization) with several, non-bleeding AVMs in duodenum, all ablated with argon plasm coagulation. GI was consulted again this admission and did push enteroscopy on 04/18/16 that revealed one non-bleeding duodenal ulcer with a visible vessel possible at prior site of argon plasma coagulation. This was treated with another  argon plasma coagulation and clip placement. However, hemoglobin continued to drop. So, tagged RBC scan was done on 5/5 and showed trace/faint amount of intraluminal activity only on the initial 60 minute cine images, not seen on the subsequent 65 to 120 minute images suggestive of a small, intermittent bleed likely from patient's known duodenal AVM seen on endoscopy. Visceral Angiogram was done on 04/22/16 by IR. Two separate pancreaticoduodenal third order arterial branches were successfully occluded with embolization coils placed via micro catheters with subsequent decrease in vascularity to the second and third portions of the duodenum. However, patient's Hgb continued to drop. Surgery was consulted suggested IR reimage for possible re-embolization, and leavingex lap as last option.  GI subsequently did capsule endoscopy on 04/27/2016 that showed bleeding in small bowel too far to reach with endoscopic equipment that we have here. After capsule endoscopy, IR felt that a repeat bleeding scan and repeat attempt at embolization might be helpful. A Tagged RBC scan was repeated on 04/29/2016 and showed faint bleeding from small intestine (not localized). Angiogram was done on 5/15 and coil embolization of the GDA and remaining pancreatico-duodenal branch of the SMA was performed. Unfortunately on 5/17 the patient's hemoglobin dropped once more, and the patient continued with melena. This unfortunate woman received a total of 9 units of blood over the course of her hospitalization. After discussion with GI, and IR it was decided that transfer to a facility with the capability of double balloon endoscopy would be in her best interest.  The patient remained hemodynamically stable throughout her hospitalization with slow bleeding only. She was transfused one unit of blood on day of her transfer. Hemoglobin was 6.6 prior to transfusion.  See the timeline of procedures etc. Listed below. Additionally, see procedure reports  and Endoscopy reports from her outpatient office 1 year prior attached to this D/C summary.   Chest pain: On admission, EKG with ST depression in lateral leads that has improved on repeat EKG in the following morning. Troponins were negative despite significant ST depression. EKG changes were thought to be related to increased demand. Cardiology was consulted and recommended nuclear scan as outpatient after her GI bleeding had stabilized. She had a junctional rhythm during her previous admission, and 30 day event monitor had been prescribed. She had been wearing this only a few days when she returned for this hospitalization.    AKI: Cr 1.06 on admission > 0.89 on discharge. Pre-renal in nature. Resolved without an issue.   Neurogenic bladder: she performs self caths at home. She had no dysuria but has suprapubic tenderness to palpation on exam.UA with many bacteria, small LE and positive nitrite. She was started on ceftriaxone on admission. Urine culture grew E. Coli sensitive to cefazolin. So she was transitioned to Keflex to complete a total of seven day's course in the hospital.   Issues for Follow Up:  1.  Needs GI consultation for possible deep endoscopy.   Significant Procedures:  5/2 Admitted with symptomatic anemia in the setting of GI bleed. Hgb 5.5  5/3 Push enterescopy: one non-bleeding duodenal ulcer treated again with argon plasma coagulation and clip placement 5/4 Hgb dropped to 6.4 5/5 Tagged RBC scan: bleeding likely from proximal small bowel, potentially 2/2 patient's known duodenal AVM. 5/7 Embolization of pancreaticoduodenal arteries by IR 5/13 Capsule endoscopy: bleeding from small intestine, to far to reach with endoscopy 5/14 Tagged RBC with faint small bowel bleeding (not well localized) 5/15 Angiogram:coil embolization of the GDA and remaining pancreatico-duodenal branch of the SMA  5/17 Hgb dropped to 6.6, transfused 1u pRBC and transfer for higher level of care.    Significant Labs and Imaging:   Recent Labs Lab 04/28/16 0728  04/30/16 0614 05/01/16 0825 05/01/16 1806 05/02/16 0430  WBC 5.5  --  5.4 7.4  --   --   HGB 6.6*  < > 7.6* 7.0* 7.0* 6.6*  HCT 20.8*  < > 23.2* 21.6* 21.6* 20.5*  PLT 193  --  199 236  --   --   < > = values in this interval not displayed.  Recent Labs Lab 04/27/16 0807 04/28/16 0725 04/30/16 0614 05/01/16 0825  NA 141 141 140 140  K 4.2 4.8 4.1 4.3  CL 106 107 108 106  CO2 24 26 26 24   GLUCOSE 91 96 83 96  BUN 24* 25* 28* 25*  CREATININE 0.84 0.99 0.94 0.89  CALCIUM 8.2* 8.0* 7.9* 8.2*    Results/Tests Pending at Time of Discharge: None  Discharge Medications:    Medication List    ASK your doctor about these medications        esomeprazole 40 MG capsule  Commonly known as:  NEXIUM  Take 1 capsule (40 mg total) by mouth daily.     ferrous sulfate 325 (65 FE) MG tablet  Take 1 tablet (325 mg total) by mouth 2 (two) times daily with a meal.     HYDROcodone-acetaminophen 10-325 MG tablet  Commonly known as:  NORCO  Take 1 tablet by mouth every 12 (twelve) hours as needed for moderate pain or severe pain.     lidocaine 2 % jelly  Commonly known as:  XYLOCAINE  Apply 1 dime size amount of gel to the area between your vagina and rectum once  daily for pain. You may use up on 2 times per day if needed     magnesium hydroxide 400 MG/5ML suspension  Commonly known as:  MILK OF MAGNESIA  Take 30 mLs by mouth daily as needed for mild constipation.     polyethylene glycol powder powder  Commonly known as:  GLYCOLAX/MIRALAX  Take 17 g by mouth daily as needed for mild constipation, moderate constipation or severe constipation.        Discharge Instructions: Please refer to Patient Instructions section of EMR for full details.  Patient was counseled important signs and symptoms that should prompt return to medical care, changes in medications, dietary instructions, activity restrictions, and follow  up appointments.   Follow-Up Appointments: Follow-up Information    Follow up with Richardson Dopp, PA-C On 05/11/2016.   Specialties:  Cardiology, Physician Assistant   Why:  at 10:30am for follow up    Contact information:   [address] [place] [address] [phone]       Follow up with [facility].   Specialty:  Home Health Services   Why:  HH PT OT RN HHA services to resume.   Contact information:   [place] [address] [phone]       Aquilla Hacker, MD 05/02/2016, 3:34 PM PGY-2, [facility]

## 2016-04-18 NOTE — Progress Notes (Signed)
Family Medicine Teaching Service Daily Progress Note Intern Pager: [phone]  Patient name: Nicole Perez Medical record number: [id number] Date of birth: [birth year] Age: 82 y.o. Gender: female  Primary Care Provider: MCDIARMID,TODD D, MD Consultants: GI and Cardiology Code Status: DNR  Pt Overview and Major Events to Date:  5/2 admitted with symptomatic anemia in the setting of GI bleed  Assessment and Plan: Nicole Perez is a 82 y.o. female presenting with symptomatic anemia. PMH is significant for recent hospitalization for anemia/ GI bleed/ Angiodysplasia, hypertension, neurogenic bladder and OA  Symptomatic anemia 2/2 GI bleed: improved. Hgb 5.5>2u>8.4>7.3. 7.3 is expected rise after 2 units. Endoscopy on previous admission revealed non-bleeding AVMs in duodenum, all ablated with APC.No ulcers or mucosal injury. -appreciated GI recs:  -Push eneteroscopy today - Telemetry - VS per floor protocol - consider feraheme - Protonix by mouth   Chest pain: improved. EKG with ST depression in lateral leads which has improved in the morning likely demand ischemia in the setting of anemia.Troponin negative x2 so far.  -Appreciate card recs:  -Nuclear scan as outpt - trend troponin - will avoid ASA in the setting of current GI bleed  AKI: improved. Cr 0.92, 1.06 on admission. Patient notes 1 day history of diarrhea on Sunday. Cr at discharge 0.78. - blood transfusion as above - follow renal function - If no improvement after blood transfusion, consider IVFs  HTN: hypotensive this morning to 90/40. On lisinopril and HCTZ at home -Will give her some bolus if continues to be hypotensive - Monitor BP  Neurogenic bladder: performs self caths at home. No dysuria but has suprapubic TTP on exam. - I &O cath prn - UA ordered -f/u urine culture  Osteoarthritis: history of back & LE OA. On Lorcet at home -Norco three times a day as needed.  - Avoid NSAIDs in the setting of GI  bleed  FEN/GI:  NPO with clears  Prophylaxis: SCDs in the setting of GI bleeding  Disposition: continue SDU pending GI work up  Subjective:  Has leg pain overnight that has improved with her pain meds and ice. Denies chest pain, shortness of breath or abdominal pain. Understands the plan today.  Objective: Temp:  [97.4 F (36.3 C)-98.5 F (36.9 C)] 98.5 F (36.9 C) (05/03 0509) Pulse Rate:  [63-82] 63 (05/03 0509) Resp:  [11-17] 11 (05/03 0509) BP: (90-133)/(40-63) 90/40 mmHg (05/03 0509) SpO2:  [98 %-100 %] 99 % (05/03 0509) Weight:  [148 lb 2.4 oz (67.2 kg)] 148 lb 2.4 oz (67.2 kg) (05/03 0509) Physical Exam: GEN: lying in bed, appears well, NAD Oropharynx: clear, moist  CVS: regular rate and rythm, normal s1 and s2, no murmurs, no edema RESP: no increased work of breathing, no crackles or wheeze GI: normal bowel sound, soft, non-tender,non-distended MSK: no tenderness to palpation NEURO: alert and oriented x4, no gross defecits  PSYCH: good affect Laboratory:  Recent Labs Lab 04/17/16 0901 04/17/16 1519 04/18/16 0405  WBC 7.3 7.6 6.6  HGB 8.4* 8.8* 7.3*  HCT 25.6* 26.2* 22.2*  PLT 81* 160 145*    Recent Labs Lab 04/16/16 1649 04/16/16 2324 04/17/16 0901  NA 138 137 139  K 4.3 4.0 4.5  CL 107 106 112*  CO2 22 20* 17*  BUN 30* 33* 29*  CREATININE 0.78 1.06* 0.92  CALCIUM 8.0* 8.0* 7.9*  PROT  --  5.3*  --   BILITOT  --  0.2*  --   ALKPHOS  --  39  --  ALT  --  15  --   AST  --  21  --   GLUCOSE 99 144* 88    Imaging/Diagnostic Tests: No results found.  Mercy Riding, MD 04/18/2016, 6:50 AM PGY-1, [place] Intern pager: [phone], text pages welcome

## 2016-04-18 NOTE — H&P (View-Only) (Signed)
[facility] Consult: 10:27 AM 04/17/2016  LOS: 0 days    Referring Provider: Dr Ardelia Mems  Primary Care Physician:  MCDIARMID,TODD D, MD Primary Gastroenterologist:  Dr. Hilarie Fredrickson     Reason for Consultation:  Recurrent GIB and anemia.   HPI: Nicole Perez is a 82 y.o. female. Hx UTIs and neurogenic bladder, self caths at home. Osteoarthritis. Spinal stenosis. Chronic back pain. Obesity. HTN. Glaucoma. 08/2011 Parotid gland bx: pleomorphic adenoma.  Hx colon polyps and rectal fissure. Hiatal hernia.  Hx recurrent Heme + anemia and melena.  04/11/15 EGD.   Normal esophagus.   Normal stomach. Normal duodenal bulb. Four non-bleeding angiodysplastic lesions in the duodenum. Treated with argon plasma coagulation (APC). Two non-bleeding angiodysplastic lesions in the duodenum. Treated with argon plasma coagulation  ANGIODYSPLASIA AS THE CAUSE FOR HER RECURRENT MELENA Plan: iron sulfate 325 mg twice daily indefinitely.  If patient were to develop recurrent anemia in the future or recurrent bleeding, recommend upper endoscopy with push enteroscopy and ablation 03/2015 Colonoscopy. Dr Hilarie Fredrickson.  INDICATIONS:heme positive stool, constipation, melena, history of hyperplastic colon polyps, rectal prolapse, last colonoscopy 2006. ENDOSCOPIC IMPRESSION: 1. Eight sessile polyps ranging between 3-56mm in size were found in the ascending colon, transverse colon, and descending colon; polypectomies were performed with a cold snare 2. Multiple sessile polyps were found in the rectosigmoid colon 3. Mild diverticulosis was noted in the transverse colon, descending colon, and sigmoid colon Path: tubular adenomas and sessile serrated adenomas.  03/2015 EGD INDICATIONS: melena and heme positive stool. ENDOSCOPIC  IMPRESSION: 1. The mucosa of the esophagus appeared normal 2. Large hiatal hernia 3. Erosive gastritis (inflammation) was found in the entire examined stomach; multiple biopsies were performed 4. The duodenal mucosa showed no abnormalities in the bulb and 2nd part of the duodenum Path: + for H Pylori gastritis. Rx'd with Prevpac vs pylera equivalents. .  02/2005 Colonoscopy. Dr Mellody Memos. Hyperplastic polyps descending colon.  1998 Colonoscopy. Dr Wynetta Emery. Scattered small polyps in rectum and sigmoid.    Admission 04/09/2016- 04/12/16 with intermittent dark stools, Hgb 6.7.  Transfused 3 PRBC. Dr Henrene Pastor performed EGD as above.  Hgb 4/27: 7.6.  Seen by cardiologist for junctional bradycardia.  Diltiazem was discontinued, rate improved.  Plan was for 30 day monitor given normal echo.  After discharge stools were greenish black with the BID iron she was taking.  On 4/30, had reddish black, tarry stools, at least 3.  No nausea, no abd pain.  Some dizziness.  Yesterday som presyncope.  No chest pain.  No stools on 5/2 or today.  Hgb 5.5.  Up to 8.4 after PRBC x 2.   Her complaint is of foot cramps which she has at home.      Past Medical History  Diagnosis Date  . Essential hypertension, benign 04/18/2010  . Lichen sclerosus et atrophicus of the vulva   . BACK PAIN, CHRONIC 04/18/2010    degenerative spine disease, spinal stenosis throughout spine  . DEGENERATIVE JOINT DISEASE, HIPS 09/11/2007    Multilevel degenerative spine dz and spinal stenosis  .  INSOMNIA, CHRONIC 04/18/2010  . Parotid adenoma 1990, 2012    Right parotid, recurrent parotid pleimorphic adenoma.   . Osteoarthritis, multiple sites 08/11/2013  . Cystocele 08/11/2013  . Acute renal failure ([place]) 02/23/2014  . History of pneumonia 07/26/2011  . Rectal fissure 12/16/2013  . UTI (urinary tract infection) 02/22/2014  . 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  Hearing loss sensory, bilateral 07/26/2011    Right >> Left.  Left ear hearing aid b/c work discrimination in       R. ear is very poor. Audiologist-Stephanie Nance at [facility] in [place].  (05/10/2010)   . HYPERLIPIDEMIA 05/10/2010    Qualifier: Diagnosis of  By: McDiarmid MD, Sherren Mocha    . Incontinence overflow, urine 04/28/2014  . Incomplete emptying of bladder 02/09/2014  . Paresthesia of both feet 08/06/2014  . Orthostatic hypotension 08/06/2014  . Blepharitis 11/16/2014    Diagnosis by optometrist, Renaldo Harrison on exam 11/13/2014  . Dry eye syndrome 11/16/2014  . Glaucoma suspect 11/16/2014  . Blood in stool 12/20/2014  . Cholelithiasis   . Colon polyps 2006. 2016    adenomatous and hyperplaxtic  . H. pylori infection 2016    h pylori erosive gastritis, treated with PPI, antibiotics.   . Hiatal hernia 05/05/2015    Large Hiatal Hernia found on EGD by Dr Hilarie Fredrickson (GI in [place]) in work up of melena and (+) FOBT.  Marland Kitchen Urethral polyp 11/17/2015  . 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  ablated.   . Overflow incontinence 04/28/2014  . Complicated UTI (urinary tract infection) 01/30/2016    Past Surgical History  Procedure Laterality Date  . Total abdominal hysterectomy w/ bilateral salpingoophorectomy  1964    Hysterectomy and bilateral oopherectomy at age 89 for benign reasons  . Bladder suspension      Bladder tack x 2 (Dr Janice Norrie)  . Cystocele repair      Rectal prolapse and cyctocele adter hysterectomy requiring anterior repair Jerilynn Mages Edwinna Areola, MD)  . Rectocele repair      Rectal prolapse and cyctocele adter hysterectomy requiring anterior repair Jerilynn Mages Edwinna Areola, MD)  . Urethral dilation    . Parotid gland tumor excision  1990    S/P excision of Right Parotid Gland Benign Tumor, 1990  . Reconstruction of nose  1994    Nasal bridge reconstruction (Dr Judie Grieve, 1994) for following  Forklift accident on job. Surgery complicated by nerve damage resulting in difficulty raising  right eyebrow   . Carpal tunnel release  2010    Carpel Tunnel Release of  left wrist  03/2009 (Dr Fredna Dow): Nerve   . Cataract extraction w/ intraocular lens implant  2010    Dr Charise Killian (ophth)  . Breast lumpectomy      Lumpectomy of benign Breast lumps bilaterally, Dr Bubba Camp  . Laminectomy      S/P L4-5 Laminectomy (1987) for decompression of spinal stenosis  . Colonoscopy w/ polypectomy  2006  . Parotidectomy  08/30/11    Radene Journey, MD (ENT) for recurrent right parotid pleomorphic adenoma by frozen section  . Carpal tunnel release      right wrist  . Esophagogastroduodenoscopy N/A 04/10/2016    Procedure: ESOPHAGOGASTRODUODENOSCOPY (EGD);  Surgeon: Irene Shipper, MD;  Location[facility] ENDOSCOPY;  Service: Endoscopy;  Laterality: N/A;    Prior to Admission medications   Medication Sig Start Date End Date Taking?  Authorizing Provider  esomeprazole (NEXIUM) 40 MG capsule Take 1 capsule (40 mg total) by mouth daily. 04/16/16  Yes Olam Idler, MD  ferrous sulfate 325 (65 FE) MG tablet Take 1 tablet (325 mg total) by mouth 2 (two) times daily with a meal. 04/11/16  Yes Mercy Riding, MD  HYDROcodone-acetaminophen (NORCO) 10-325 MG tablet Take 1 tablet by mouth every 12 (twelve) hours as needed for moderate pain or severe pain. Patient taking differently: Take 1 tablet by mouth every 8 (eight) hours as needed for moderate pain or severe pain.  04/16/16  Yes Lind Covert, MD  lidocaine (XYLOCAINE) 2 % jelly Apply 1 dime size amount of gel to the area between your vagina and rectum once daily for pain. You may use up on 2 times per day if needed 03/12/16  Yes Caren Macadam, MD  magnesium hydroxide (MILK OF MAGNESIA) 400 MG/5ML suspension Take 30 mLs by mouth daily as needed for mild constipation.   Yes Historical Provider, MD  polyethylene glycol powder (GLYCOLAX/MIRALAX) powder Take 17 g by mouth daily as needed for mild constipation, moderate constipation or severe constipation. 02/14/16  Yes  Blane Ohara McDiarmid, MD    Scheduled Meds: . antiseptic oral rinse  7 mL Mouth Rinse BID  . cefTRIAXone (ROCEPHIN)  IV  1 g Intravenous Q24H  . erythromycin   Both Eyes Q6H  . pantoprazole (PROTONIX) IV  80 mg Intravenous Q12H  . sodium chloride flush  3 mL Intravenous Q12H   Infusions:   PRN Meds: acetaminophen **OR** acetaminophen, alum & mag hydroxide-simeth, HYDROcodone-acetaminophen, magnesium hydroxide, ondansetron **OR** ondansetron (ZOFRAN) IV, polyethylene glycol   Allergies as of 04/16/2016 - Review Complete 04/16/2016  Allergen Reaction Noted  . Nitrofurantoin Other (See Comments) 10/05/2015    Family History  Problem Relation Age of Onset  . Coronary artery disease Mother   . Coronary artery disease Sister     open heart surgery  . Diabetes type II Sister   . Stroke Father 46  . Hypertension Father   . Lupus Brother   . Heart disease Brother   . Heart attack Sister     Social History   Social History  . Marital Status: Widowed    Spouse Name: N/A  . Number of Children: N/A  . Years of Education: N/A   Occupational History  . retired    Social History Main Topics  . Smoking status: Former Smoker    Quit date: 01/14/1984  . Smokeless tobacco: Former Systems developer  . Alcohol Use: No  . Drug Use: No  . Sexual Activity: No     Comment: hysterectomy   Other Topics Concern  . Not on file   Social History Narrative   Has 8 children   [name], [name], involved in her care   4 brothers, 5 sisters, 3 deceased siblings   Cares for her great-grandchildren during the day   Widowed in 2009   Lives alone.   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              REVIEW OF SYSTEMS: Constitutional: Per HPI.  Stable weight.  ENT: No nose bleeds. Wears hearing aid.  Pulm: Per HPI. No  cough CV: No palpitations, no  LE edema. Chest pain resolved GU: normally avoids coffee but drank several cups 11 days ago and this set off her bladder: irritated, burning discomfort, unable to sleep. No blood in urine.  GI: No dysphagia.  Heme: Per HPI. No unusual bleeding or bruising.  Transfusions: None ever before MS: Body pain as per HPI Neuro: No headaches, no peripheral tingling or numbness Derm: No itching, no rash or sores.  Endocrine: No sweats or chills. No polyuria or dysuria Immunization: reviewed Travel: None beyond local counties in last few months.   PHYSICAL EXAM: Vital signs in last 24 hours: Filed Vitals:   04/17/16 0700 04/17/16 0900  BP: 122/50 124/53  Pulse: 74 82  Temp: 97.6 F (36.4 C)   Resp: 16 13   Wt Readings from Last 3 Encounters:  04/17/16 64 kg (141 lb 1.5 oz)  04/16/16 63.957 kg (141 lb)  04/12/16 64.456 kg (142 lb 1.6 oz)   General: Uncomfortable with her foot cramps. not ill looking.  Head: No trauma, no swelling or asymmetry  Eyes: + conj pallor. EOMI.  Ears: Hearing aid in place.  Nose: No discharge or congestion Mouth: Moist, clear. Upper partial/bridge. Lower has only a few incisors. MM pink, clear and moist Neck: No JVD, no TMG, no bruits Lungs: Clear bil. No cough or dyspnea Heart: RRR. No mrg. S1/s2 present Abdomen: Soft, NT. No masses, HSM, bruits, or hernias.  Rectal: deferred. FOBT + dark stool in ED  Musc/Skeltl: no joint deformity, swelling Extremities: No CCE  Neurologic: Oriented x 3. No gross deficits. Moves all 4s. Limb strength not tested.  Skin: No rash or sores Tattoos: none  Psych: affect subdued.  Cooperative. Calm, cooperative.  Intake/Output from previous day: 05/01 0701 - 05/02 0700 In: 1125.8 [I.V.:20.8; Blood:1005; IV Piggyback:100] Out: 900 [Urine:900] Intake/Output this shift:    LAB RESULTS:  Recent Labs  04/16/16 1649 04/16/16 2324  04/17/16 0901  WBC 7.8 8.2 7.3  HGB 5.7* 5.5* 8.4*  HCT 18.1* 17.3* 25.6*  PLT 225 203 81*   BMET Lab Results  Component Value Date   NA 139 04/17/2016   NA 137 04/16/2016   NA 138 04/16/2016   K 4.5 04/17/2016   K 4.0 04/16/2016   K 4.3 04/16/2016   CL 112* 04/17/2016   CL 106 04/16/2016   CL 107 04/16/2016   CO2 17* 04/17/2016   CO2 20* 04/16/2016   CO2 22 04/16/2016   GLUCOSE 88 04/17/2016   GLUCOSE 144* 04/16/2016   GLUCOSE 99 04/16/2016   BUN 29* 04/17/2016   BUN 33* 04/16/2016   BUN 30* 04/16/2016   CREATININE 0.92 04/17/2016   CREATININE 1.06* 04/16/2016   CREATININE 0.78 04/16/2016   CALCIUM 7.9* 04/17/2016   CALCIUM 8.0* 04/16/2016   CALCIUM 8.0* 04/16/2016   LFT  Recent Labs  04/16/16 2324  PROT 5.3*  ALBUMIN 2.9*  AST 21  ALT 15  ALKPHOS 39  BILITOT 0.2*   PT/INR Lab Results  Component Value Date   INR 1.22 04/16/2016   INR 1.25 04/09/2016   INR 1.01 12/18/2014   Hepatitis Panel No results for input(s): HEPBSAG, HCVAB, HEPAIGM, HEPBIGM in the last 72 hours. C-Diff No components found for: CDIFF Lipase  No results found for: LIPASE  Drugs of Abuse  No results found for: LABOPIA, COCAINSCRNUR, LABBENZ, AMPHETMU, THCU, LABBARB   RADIOLOGY STUDIES: Dg Chest Port 1 View  04/17/2016  CLINICAL DATA:  Pain between the shoulder blades and in the chest for 2 hours.  Heart monitor. EXAM: PORTABLE CHEST 1 VIEW COMPARISON:  04/09/2016 FINDINGS: Normal heart size and pulmonary vascularity. No focal airspace disease or consolidation in the lungs. No blunting of costophrenic angles. No pneumothorax. Mediastinal contours appear intact. Degenerative changes in the spine and shoulders. Calcified and tortuous aorta. IMPRESSION: No active disease. Electronically Signed   By: Lucienne Capers M.D.   On: 04/17/2016 00:11    ENDOSCOPIC STUDIES: Per HPI  IMPRESSION:   *  Recurrent GIB and blood loss anemia.  S/p PRBC x 2, recent EGD with ablation SB AVMs  *   UTI. + urinary WBCs, leukocytes, Nitrites, bacteruria.  On Rocephin.   *  Foot cramps  *  Recent inpt eval of junctional bradycardia, resolved with d/c of Cardizem.  Plan was 30 day monitor.     PLAN:     *  Set for AM enteroscopy tomorrow with MAC sedation.  Clears today.    *  Switch to oral Protonix.  AVMs not responsive to PPI.    *   Changed norco from q 8 to q 6 hours prn, so she can get dose now for her foot cramps.    Azucena Freed  04/17/2016, 10:27 AM Pager: [phone]      Attending physician's note   I have taken an interval history, reviewed the chart and examined the patient. I agree with the Advanced Practitioner's note, impression and recommendations. Patient admitted with symptomatic anemia and history of melena. She recently underwent EGD with ablation of AVM in proximal small bowel. Her presentation is concerning for recurrent GI bleed possible secondary to AVM. Will plan for enteroscopy tomorrow for evaluation and management. Also noted diffuse ST depression and brady cardia which was though to be in the setting of diltiazem and has improved since. Cardiology is planning to do nuclear stress test once her GI bleed has resolved.   Damaris Hippo, MD [phone] Mon-Fri 8a-5p [phone] after 5p, weekends, holidays

## 2016-04-18 NOTE — Op Note (Signed)
[name] Patient Name: Nicole Perez Procedure Date : 04/18/2016 MRN: [id number] Attending MD: Mauri Pole , MD Date of Birth: [birth year] CSN: [id number] Age: 82 Admit Type: Inpatient Procedure:                Small bowel enteroscopy Indications:              Acute post hemorrhagic anemia, Melena Providers:                Mauri Pole, MD, Dortha Schwalbe, RN,                            Despina Pole, Technician Referring MD:              Medicines:                Monitored Anesthesia Care Complications:            No immediate complications. Estimated Blood Loss:     Estimated blood loss was minimal. Procedure:                Pre-Anesthesia Assessment:                           - Prior to the procedure, a History and Physical                            was performed, and patient medications and                            allergies were reviewed. The patient's tolerance of                            previous anesthesia was also reviewed. The risks                            and benefits of the procedure and the sedation                            options and risks were discussed with the patient.                            All questions were answered, and informed consent                            was obtained. Prior Anticoagulants: The patient has                            taken no previous anticoagulant or antiplatelet                            agents. ASA Grade Assessment: IV - A patient with                            severe systemic disease that is a constant threat  to life. After reviewing the risks and benefits,                            the patient was deemed in satisfactory condition to                            undergo the procedure.                           After obtaining informed consent, the endoscope was                            passed under direct vision. Throughout the   procedure, the patient's blood pressure, pulse, and                            oxygen saturations were monitored continuously. The                            [id number] scope was introduced through                            the mouth and advanced to the mid-jejunum. The                            small bowel enteroscopy was accomplished without                            difficulty. The patient tolerated the procedure                            well. Scope In: Scope Out: Findings:      The esophagus was normal.      The stomach was normal.      One non-bleeding cratered duodenal ulcer with a visible vessel was found       in the third portion of the duodenum. The lesion was 5-6 mm in largest       dimension. Coagulation for bleeding prevention using argon plasma was       successful. For hemostasis, two hemostatic clips were successfully       placed. There was no bleeding at the end of the procedure.      There was no evidence of significant pathology at 200 cm (from the       incisors). Impression:               - Normal esophagus.                           - Normal stomach.                           - One non-bleeding duodenal ulcer with a visible                            vessel possible at prior site of apc s/p  coagulation. Treated with argon plasma coagulation                            (APC). Clips were placed.                           - The examined portion of the jejunum was normal.                           - No specimens collected. Recommendation:           - Advance diet as tolerated today                           - Use Protonix (pantoprazole) 40 mg PO BID.                           -Monitor Hgb daily and transfuse as needed                           -Will sign off, please call with any questions Procedure Code(s):        --- Professional ---                           [phone], Small intestinal endoscopy, enteroscopy                             beyond second portion of duodenum, not including                            ileum; with control of bleeding (eg, injection,                            bipolar cautery, unipolar cautery, laser, heater                            probe, stapler, plasma coagulator) Diagnosis Code(s):        --- Professional ---                           K26.4, Chronic or unspecified duodenal ulcer with                            hemorrhage                           D62, Acute posthemorrhagic anemia                           K92.1, Melena (includes Hematochezia) CPT copyright 2016 American Medical Association. All rights reserved. The codes documented in this report are preliminary and upon coder review may  be revised to meet current compliance requirements. Mauri Pole, MD 04/18/2016 11:39:38 AM This report has been signed electronically. Number of Addenda: 0

## 2016-04-19 ENCOUNTER — Encounter (HOSPITAL_COMMUNITY): Payer: Self-pay | Admitting: Gastroenterology

## 2016-04-19 DIAGNOSIS — N319 Neuromuscular dysfunction of bladder, unspecified: Secondary | ICD-10-CM

## 2016-04-19 LAB — HEMOGLOBIN AND HEMATOCRIT, BLOOD
HCT: 21.7 % — ABNORMAL LOW (ref 36.0–46.0)
HEMATOCRIT: 25.3 % — AB (ref 36.0–46.0)
Hemoglobin: 6.7 g/dL — CL (ref 12.0–15.0)
Hemoglobin: 8.1 g/dL — ABNORMAL LOW (ref 12.0–15.0)

## 2016-04-19 LAB — CBC
HCT: 20.5 % — ABNORMAL LOW (ref 36.0–46.0)
Hemoglobin: 6.5 g/dL — CL (ref 12.0–15.0)
MCH: 30.8 pg (ref 26.0–34.0)
MCHC: 31.7 g/dL (ref 30.0–36.0)
MCV: 97.2 fL (ref 78.0–100.0)
PLATELETS: 161 10*3/uL (ref 150–400)
RBC: 2.11 MIL/uL — ABNORMAL LOW (ref 3.87–5.11)
RDW: 20.8 % — AB (ref 11.5–15.5)
WBC: 7 10*3/uL (ref 4.0–10.5)

## 2016-04-19 LAB — PREPARE RBC (CROSSMATCH)

## 2016-04-19 MED ORDER — CEPHALEXIN 500 MG PO CAPS
500.0000 mg | ORAL_CAPSULE | Freq: Three times a day (TID) | ORAL | Status: DC
  Administered 2016-04-19 – 2016-04-24 (×15): 500 mg via ORAL
  Filled 2016-04-19 (×15): qty 1

## 2016-04-19 MED ORDER — SODIUM CHLORIDE 0.9 % IV SOLN
Freq: Once | INTRAVENOUS | Status: AC
  Administered 2016-04-19: 10 mL via INTRAVENOUS

## 2016-04-19 NOTE — Progress Notes (Signed)
Patient lost IV access during administration of blood. Patient required IV team to start IV. Patient will not be able to finish full bag of blood prior to 4 hour window. Patient received 288ml out of a possible 335. Paging doctor to inform them of status.   Milford Cage, RN

## 2016-04-19 NOTE — Progress Notes (Signed)
Family Medicine Teaching Service Daily Progress Note Intern Pager: [phone]  Patient name: Nicole Perez Medical record number: [id number] Date of birth: [birth year] Age: 82 y.o. Gender: female  Primary Care Provider: MCDIARMID,TODD D, MD Consultants: GI and Cardiology Code Status: DNR  Pt Overview and Major Events to Date:  5/2 admitted with symptomatic anemia in the setting of GI bleed and received 2 units 5/3 push enterescopy  Assessment and Plan: Nicole Perez is a 82 y.o. female presenting with symptomatic anemia. PMH is significant for recent hospitalization for anemia/ GI bleed/ Angiodysplasia, hypertension, neurogenic bladder and OA  Symptomatic anemia 2/2 GI bleed: Hgb 5.5>2u>8.4>7.3>6.5.Repeat H&H 6.7. 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 Colonoscopy 03/2015 with multiple sessile polyps throughout her colon and mild diverticulosis from transverse colon down. Push enteroscopy on 5/3 revealed one non-bleeding duodenal ulcer with a visible vessel possible at prior site of argon plasma coagulation, treated with another argon plasma coagulation and clip placement. Patient asymptomatic lying in bed.  - appreciated GI recs:  -possible bleeding scan -PO Protonix 40 twice a day  - Transfuse one unit of blood  - Post transfusion H&H - Telemetry - VS per floor protocol  Chest pain: resolved. EKG with ST depression in lateral leads which has improved in the morning likely demand ischemia in the setting of anemia.Troponin negative x2  -Appreciate card recs: -Nuclear scan as outpt - will avoid ASA in the setting of current GI bleed  AKI: improved. Last Cr 0.92, 1.06 on admission.Baseline appears to be at 0.78.  HTN: hypotensive with diastolic BPs in [id number] and [id number]. On lisinopril and HCTZ at home - Monitor BP  Neurogenic bladder: performs self caths at home. No dysuria but has suprapubic TTP on  exam.UA with many bacteria, small LE and positive nitrite. Urine culture with E. coli - I &O cath prn -CTX 5/2> -F/u culture sensitivity   Osteoarthritis: history of back & LE OA. On Lorcet at home - Norco three times a day as needed.  - Avoid NSAIDs in the setting of GI bleed - Bowel regimen (miralax and MOM)  FEN/GI:  Regular diet  Prophylaxis: SCDs in the setting of GI bleeding  Disposition: SDU as Hgb continues to drop  Subjective:  Had push enteroscopy yesterday. Hgb down to 6.5 this morning. Denies chest pain, shortness of breath or dizziness. She has mild throat and right sided abdominal pain after push enteroscopy. Had small BM last night. Didn't know if bloody or dark.  Objective: Temp:  [98.2 F (36.8 C)-98.6 F (37 C)] 98.4 F (36.9 C) (05/04 0420) Pulse Rate:  [43-91] 43 (05/04 0420) Resp:  [12-27] 12 (05/04 0420) BP: (88-137)/(33-66) 110/43 mmHg (05/04 0420) SpO2:  [97 %-100 %] 99 % (05/04 0420) Physical Exam: GEN: lying in bed, appears well, NAD Oropharynx: clear, moist  CVS: skipped beats, normal s1 and s2, no murmurs, no edema RESP: no increased work of breathing, no crackles or wheeze GI: normal bowel sound, soft, non-tender,non-distended MSK: no tenderness to palpation NEURO: alert and oriented x4, no gross defecits  PSYCH: good affect, pleasant  Laboratory:  Recent Labs Lab 04/17/16 1519 04/18/16 0405 04/19/16 0227  WBC 7.6 6.6 7.0  HGB 8.8* 7.3* 6.5*  HCT 26.2* 22.2* 20.5*  PLT 160 145* 161    Recent Labs Lab 04/16/16 1649 04/16/16 2324 04/17/16 0901  NA 138 137 139  K 4.3 4.0 4.5  CL 107 106 112*  CO2 22 20*  17*  BUN 30* 33* 29*  CREATININE 0.78 1.06* 0.92  CALCIUM 8.0* 8.0* 7.9*  PROT  --  5.3*  --   BILITOT  --  0.2*  --   ALKPHOS  --  39  --   ALT  --  15  --   AST  --  21  --   GLUCOSE 99 144* 88    Imaging/Diagnostic Tests: Push enteroscopy revealed one non-bleeding duodenal ulcer with a visible vessel possible at  prior site of argon plasma coagulation, treated with another argon plasma coagulation and clip placement  Hgb 6.5  Mercy Riding, MD 04/19/2016, 7:01 AM PGY-1, [place] Intern pager: [phone], text pages welcome

## 2016-04-19 NOTE — Progress Notes (Signed)
PT Cancellation Note  Patient Details Name: Nicole Perez MRN: [id number] DOB: 07/09/1933   Cancelled Treatment:    Reason Eval/Treat Not Completed: Medical issues which prohibited therapy (Pt w/ symptomatic anemia w/ Hgb 6.7).  PT will continue to follow acutely but will hold PT until pt more medically appropriate.  Collie Siad PT, DPT  Pager: [phone] Phone: [phone] 04/19/2016, 9:21 AM

## 2016-04-19 NOTE — Progress Notes (Signed)
CRITICAL VALUE ALERT  Critical value received:  hgb 6.5  Date of notification:  04/19/16  Time of notification:  0430  Critical value read back: Yes  Nurse who received alert:  Remo Lipps, RN  MD notified (1st page):  FM on call  Time MD responded:  [id number] - holding off on blood for now

## 2016-04-20 ENCOUNTER — Inpatient Hospital Stay (HOSPITAL_COMMUNITY): Payer: Medicare Other

## 2016-04-20 DIAGNOSIS — K922 Gastrointestinal hemorrhage, unspecified: Secondary | ICD-10-CM | POA: Insufficient documentation

## 2016-04-20 DIAGNOSIS — K2971 Gastritis, unspecified, with bleeding: Secondary | ICD-10-CM

## 2016-04-20 LAB — TYPE AND SCREEN
ABO/RH(D): O POS
ANTIBODY SCREEN: NEGATIVE
UNIT DIVISION: 0
UNIT DIVISION: 0
Unit division: 0

## 2016-04-20 LAB — CBC
HEMATOCRIT: 23.6 % — AB (ref 36.0–46.0)
HEMATOCRIT: 23 % — AB (ref 36.0–46.0)
HEMOGLOBIN: 7.4 g/dL — AB (ref 12.0–15.0)
HEMOGLOBIN: 7.4 g/dL — AB (ref 12.0–15.0)
MCH: 29.2 pg (ref 26.0–34.0)
MCH: 30.5 pg (ref 26.0–34.0)
MCHC: 31.4 g/dL (ref 30.0–36.0)
MCHC: 32.2 g/dL (ref 30.0–36.0)
MCV: 93.3 fL (ref 78.0–100.0)
MCV: 94.7 fL (ref 78.0–100.0)
Platelets: 152 10*3/uL (ref 150–400)
Platelets: 157 10*3/uL (ref 150–400)
RBC: 2.53 MIL/uL — AB (ref 3.87–5.11)
RBC: 2.43 MIL/uL — AB (ref 3.87–5.11)
RDW: 20.1 % — ABNORMAL HIGH (ref 11.5–15.5)
RDW: 19.9 % — ABNORMAL HIGH (ref 11.5–15.5)
WBC: 6.3 10*3/uL (ref 4.0–10.5)
WBC: 6.7 10*3/uL (ref 4.0–10.5)

## 2016-04-20 LAB — URINE CULTURE

## 2016-04-20 LAB — TROPONIN I: Troponin I: <0.03 ng/mL (ref <0.031)

## 2016-04-20 MED ORDER — MUSCLE RUB 10-15 % EX CREA
TOPICAL_CREAM | Freq: Two times a day (BID) | CUTANEOUS | Status: DC | PRN
  Filled 2016-04-20: qty 85

## 2016-04-20 MED ORDER — HYDROCODONE-ACETAMINOPHEN 10-325 MG PO TABS
1.0000 | ORAL_TABLET | Freq: Two times a day (BID) | ORAL | Status: DC | PRN
Start: 2016-04-20 — End: 2016-04-22
  Administered 2016-04-20 – 2016-04-22 (×5): 1 via ORAL
  Filled 2016-04-20 (×5): qty 1

## 2016-04-20 MED ORDER — TECHNETIUM TC 99M-LABELED RED BLOOD CELLS IV KIT
25.0000 | PACK | Freq: Once | INTRAVENOUS | Status: AC | PRN
  Administered 2016-04-20: 25 via INTRAVENOUS

## 2016-04-20 MED ORDER — TROLAMINE SALICYLATE 10 % EX CREA: TOPICAL_CREAM | Freq: Two times a day (BID) | CUTANEOUS | Status: DC | PRN

## 2016-04-20 NOTE — Progress Notes (Signed)
    Subjective:  No chest pain or shortness of breath.  Objective:  Vital Signs in the last 24 hours: Temp:  [97.7 F (36.5 C)-98.7 F (37.1 C)] 97.9 F (36.6 C) (05/05 0746) Pulse Rate:  [42-100] 76 (05/05 0746) Resp:  [12-21] 12 (05/05 0746) BP: (91-121)/(38-89) 119/65 mmHg (05/05 0746) SpO2:  [97 %-100 %] 100 % (05/05 0746)  Intake/Output from previous day: 05/04 0701 - 05/05 0700 In: 675 [P.O.:360; I.V.:13; Blood:252; IV Piggyback:50] Out: [id number] [Urine:1650]  Physical Exam: Pt is alert and oriented, pleasant elderly woman in NAD HEENT: normal Neck: JVP - normal Lungs: CTA bilaterally CV: RRR without murmur or gallop Abd: soft, NT Ext: no C/C/E, distal pulses intact and equal Skin: warm/dry no rash   Lab Results:  Recent Labs  04/19/16 0227  04/19/16 1648 04/20/16 0315  WBC 7.0  --   --  6.3  HGB 6.5*  < > 8.1* 7.4*  PLT 161  --   --  152  < > = values in this interval not displayed. No results for input(s): NA, K, CL, CO2, GLUCOSE, BUN, CREATININE in the last 72 hours.  Recent Labs  04/17/16 1519  TROPONINI 0.03    Cardiac Studies: EKG 04/19/2016: Sinus bradycardia 49 bpm, minor nonspecific ST changes. Marked ST depression from previous tracing resolved.   Tele: Personally reviewed: sinus rhythm  Assessment/Plan:  Abnormal EKG: marked diffuse ST depression in context of acute GI bleed and severe anemia. Patient has remained stable throughout her hospitalization from a cardiac perspective. EKG changes have resolved. Continued issues with anemia noted. Plan unchanged from cardiac perspective: will arrange outpatient Myoview for risk stratification and office FU visit after she recovers from current hospitalization. Will sign off - please call if questions.   Sherren Mocha, M.D. 04/20/2016, 9:23 AM

## 2016-04-20 NOTE — Progress Notes (Signed)
PT Cancellation Note  Patient Details Name: NERINE STURMS MRN: [id number] DOB: September 15, 1933   Cancelled Treatment:    Reason Eval/Treat Not Completed: Medical issues which prohibited therapy (Pt in Nuclear med per nursing.  Will check back tomorrow. ) Thanks.   Irwin Brakeman F 04/20/2016, 3:33 PM [facility] [phone] (pager)

## 2016-04-20 NOTE — Progress Notes (Signed)
Family Medicine Teaching Service Daily Progress Note Intern Pager: [phone]  Patient name: Nicole Perez Medical record number: [id number] Date of birth: 1933-09-02 Age: 82 y.o. Gender: female  Primary Care Provider: MCDIARMID,TODD D, MD Consultants: GI and Cardiology Code Status: DNR  Pt Overview and Major Events to Date:  5/2 admitted with symptomatic anemia in the setting of GI bleed and received 2 units 5/3 push enterescopy  Assessment and Plan: Nicole Coffernnie M Hubka is a 82 y.o. female presenting with symptomatic anemia. PMH is significant for recent hospitalization for anemia/ GI bleed/ Angiodysplasia, hypertension, neurogenic bladder and OA  Symptomatic anemia 2/2 GI bleed: Hgb 5.5>2u>8.4>7.3>6.5>1u>8.1>7.4. The later is expected after a unit of blood. 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 Colonoscopy 03/2015 with multiple sessile polyps throughout her colon and mild diverticulosis from transverse colon down. Push enteroscopy on 5/3 revealed one non-bleeding duodenal ulcer with a visible vessel possible at prior site of argon plasma coagulation, treated with another argon plasma coagulation and clip placement. Patient with epigastric pain overnight. She describes as burning. This is new for her. - Talked to GI over the phone yesterday. They may do bleeding scan. Will call again to know if this is still a plan. -PO Protonix 40 twice a day  - CBC this afternoon - Will order bleeding scan in Hgb drops - Telemetry - VS per floor protocol  Chest pain: resolved. EKG with ST depression in lateral leads which has improved in the morning likely demand ischemia in the setting of anemia.Troponin negative x2  -Appreciate card recs: -Nuclear scan as outpt - will avoid ASA in the setting of current GI bleed  AKI: improved. Last Cr 0.92, 1.06 on admission.Baseline appears to be at 0.78.  HTN: hypotensive with  diastolic BPs in 40's and 50's. On lisinopril and HCTZ at home -Holding home meds - Monitor BP  Neurogenic bladder: performs self caths at home. No dysuria but has suprapubic TTP on exam.UA with many bacteria, small LE and positive nitrite. Urine culture with E. Coli sensitive to Cefazolin - I &O cath prn -CTX 5/2>5/4 -Keflex 5/4>5/8  Osteoarthritis: compalains leg pain overnight. Also some numbness. History of back & LE OA. On Lorcet at home - Norco three times a day as needed.  - Avoid NSAIDs in the setting of GI bleed - Bowel regimen (miralax and MOM) - Aspercreme twice a day as needed - Consider gabapentin if no improvement  FEN/GI:  Regular diet  Prophylaxis: SCDs in the setting of GI bleeding  Disposition: SDU as Hgb continues to drop  Subjective:  Had a unit of blood yesterday. Hgb 7.4 this morning which is expected rise after a unit of blood. GI didn't see the patient yesterday. Epigastric and leg pain overnight. Had bowel movement twice. Still dark. No bright red blood.  Objective: Temp:  [97.7 F (36.5 C)-98.7 F (37.1 C)] 97.7 F (36.5 C) (05/05 0400) Pulse Rate:  [42-100] 75 (05/05 0400) Resp:  [9-21] 14 (05/05 0400) BP: (91-122)/(42-89) 99/50 mmHg (05/05 0400) SpO2:  [97 %-100 %] 97 % (05/05 0400) Physical Exam: GEN: lying in bed, appears well, NAD Oropharynx: clear, moist  CVS: skipped beats, normal s1 and s2, no murmurs, no edema RESP: no increased work of breathing, no crackles or wheeze GI: normal bowel sound, soft, tender to palpation over epigastric area, non-distended NEURO: alert and oriented x4, no gross defecits  PSYCH: good affect, pleasant  Laboratory:  Recent Labs Lab 04/18/16  0405 04/19/16 0227 04/19/16 0735 04/19/16 1648 04/20/16 0315  WBC 6.6 7.0  --   --  6.3  HGB 7.3* 6.5* 6.7* 8.1* 7.4*  HCT 22.2* 20.5* 21.7* 25.3* 23.6*  PLT 145* 161  --   --  152    Recent Labs Lab 04/16/16 1649 04/16/16 2324 04/17/16 0901  NA 138  137 139  K 4.3 4.0 4.5  CL 107 106 112*  CO2 22 20* 17*  BUN 30* 33* 29*  CREATININE 0.78 1.06* 0.92  CALCIUM 8.0* 8.0* 7.9*  PROT  --  5.3*  --   BILITOT  --  0.2*  --   ALKPHOS  --  39  --   ALT  --  15  --   AST  --  21  --   GLUCOSE 99 144* 88    Imaging/Diagnostic Tests: Push enteroscopy revealed one non-bleeding duodenal ulcer with a visible vessel possible at prior site of argon plasma coagulation, treated with another argon plasma coagulation and clip placement  Hgb 7.4  Mercy Riding, MD 04/20/2016, 7:09 AM PGY-1, [facility] Intern pager: [phone], text pages welcome

## 2016-04-20 NOTE — Care Management Important Message (Signed)
Important Message  Patient Details  Name: Nicole Perez MRN: [id number] Date of Birth: 09-Aug-1933   Medicare Important Message Given:  Yes    [name] May 04/20/2016, 2:57 PM

## 2016-04-21 DIAGNOSIS — K31811 Angiodysplasia of stomach and duodenum with bleeding: Principal | ICD-10-CM

## 2016-04-21 LAB — BASIC METABOLIC PANEL
ANION GAP: 7 (ref 5–15)
BUN: 28 mg/dL — AB (ref 6–20)
CALCIUM: 8.1 mg/dL — AB (ref 8.9–10.3)
CO2: 24 mmol/L (ref 22–32)
CREATININE: 0.9 mg/dL (ref 0.44–1.00)
Chloride: 108 mmol/L (ref 101–111)
GFR calc Af Amer: >60 mL/min (ref >60)
GFR, EST NON AFRICAN AMERICAN: 59 mL/min — AB (ref >60)
GLUCOSE: 94 mg/dL (ref 65–99)
Potassium: 4.8 mmol/L (ref 3.5–5.1)
SODIUM: 139 mmol/L (ref 135–145)

## 2016-04-21 LAB — PROTIME-INR
INR: 1.15 (ref 0.00–1.49)
Prothrombin Time: 14.9 seconds (ref 11.6–15.2)

## 2016-04-21 LAB — CBC
HEMATOCRIT: 21.9 % — AB (ref 36.0–46.0)
HEMOGLOBIN: 7 g/dL — AB (ref 12.0–15.0)
MCH: 30.2 pg (ref 26.0–34.0)
MCHC: 32 g/dL (ref 30.0–36.0)
MCV: 94.4 fL (ref 78.0–100.0)
Platelets: 165 10*3/uL (ref 150–400)
RBC: 2.32 MIL/uL — AB (ref 3.87–5.11)
RDW: 19.3 % — ABNORMAL HIGH (ref 11.5–15.5)
WBC: 7.3 10*3/uL (ref 4.0–10.5)

## 2016-04-21 LAB — PREPARE RBC (CROSSMATCH)

## 2016-04-21 MED ORDER — SODIUM CHLORIDE 0.9 % IV SOLN
Freq: Once | INTRAVENOUS | Status: AC
  Administered 2016-04-21: 16:00:00 via INTRAVENOUS

## 2016-04-21 NOTE — Progress Notes (Signed)
Family Medicine Teaching Service Daily Progress Note Intern Pager: [phone]  Patient name: Nicole Perez Medical record number: [id number] Date of birth: 04/21/1933 Age: 82 y.o. Gender: female  Primary Care Provider: MCDIARMID,TODD D, MD Consultants: GI and Cardiology Code Status: DNR  Pt Overview and Major Events to Date:  5/2 admitted with symptomatic anemia in the setting of GI bleed and received 2 units 5/3 push enterescopy 5/4 transfused 1 unit pRBCs 5/5 tagged RBC scan  Assessment and Plan: Nicole Perez is a 82 y.o. female presenting with symptomatic anemia. PMH is significant for recent hospitalization for anemia/ GI bleed/ Angiodysplasia, hypertension, neurogenic bladder and OA  # Symptomatic anemia 2/2 GI bleed: Hgb 5.5>2u>8.4>7.3>6.5>1u>8.1>7.4>>7.0.  Push enteroscopy on 5/3 revealed one non-bleeding duodenal ulcer treated with argon plasma coagulation and clip placement.  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 PT 14.9/ INR 1.15. Plts stable.  - c/s [facility] rec: PT/INR, c/s to IR for embolization, Continue PO Protonix 40 twice a day  - c/s IR: Discussed case with Dr Kathlene Cote this am.  He agrees w/ PT/INR.  He is unsure if he will have the staff for procedure this weekend but will be by to assess later this am. - Telemetry - VS per floor protocol - Likely will transfuse 1 unit pRBCs +/- FFP this am.  # Chest pain: resolved. EKG with ST depression in lateral leads which has improved in the morning likely demand ischemia in the setting of anemia.Troponin negative x2  - Appreciate card recs, Nuclear scan as outpt - will avoid ASA in the setting of current GI bleed  # AKI: previously improved.  Last Cr 0.92, 1.06 on admission.Baseline appears to be at 0.78. - BMP pending  # HTN: hypotensive with diastolic BPs in [id number] and [id number]. On lisinopril and HCTZ at home - Holding home  meds - Monitor BP  # Neurogenic bladder: performs self caths at home. No dysuria but has suprapubic TTP on exam.UA with many bacteria, small LE and positive nitrite. Urine culture with E. Coli sensitive to Cefazolin - I &O cath prn - CTX 5/2>5/4 - Keflex 5/4>5/8  # Osteoarthritis: compalains leg pain overnight. Also some numbness. History of back & LE OA. On Lorcet at home - Home Norco 10 BID prn - Avoid NSAIDs in the setting of GI bleed - Bowel regimen (miralax and MOM) - Aspercreme twice a day as needed - Consider gabapentin if no improvement  FEN/GI:  Regular diet for now, NPO once IR assesses.  Prophylaxis: SCDs in the setting of GI bleeding  Disposition: SDU as Hgb continues to drop  Subjective:  Patient notes that she is feeling ok this am.  We discussed at bedside with GI provider, plans for IR intervention.  Unsure as to when this procedure will take place.  Discussed that procedure seemed contingent upon staffing.  She understands that they will be by to see her this am to discuss details/ risk/ benefit of procedure.  No nausea, vomiting but notes that her belly "feels empty" this am.  Objective: Temp:  [97.9 F (36.6 C)-98.3 F (36.8 C)] 98.3 F (36.8 C) (05/06 0404) Pulse Rate:  [43-108] 43 (05/06 0400) Resp:  [12-26] 14 (05/06 0400) BP: (99-139)/(32-80) 104/51 mmHg (05/06 0400) SpO2:  [97 %-100 %] 97 % (05/06 0400) Physical Exam: GEN: lying in bed, appears well, NAD HEENT: pale conjunctiva, MMM  CVS: occ skipped  beats, normal s1 and s2, no murmurs, no edema RESP: normal work of breathing, no crackles or wheeze GI: normal bowel sound, soft, tender to palpation over epigastric area, non-distended NEURO: alert and oriented x4, no gross defecits  PSYCH: good affect, pleasant  Laboratory:  Recent Labs Lab 04/20/16 0315 04/20/16 1416 04/21/16 0240  WBC 6.3 6.7 7.3  HGB 7.4* 7.4* 7.0*  HCT 23.6* 23.0* 21.9*  PLT 152 157 165    Recent Labs Lab  04/16/16 1649 04/16/16 2324 04/17/16 0901  NA 138 137 139  K 4.3 4.0 4.5  CL 107 106 112*  CO2 22 20* 17*  BUN 30* 33* 29*  CREATININE 0.78 1.06* 0.92  CALCIUM 8.0* 8.0* 7.9*  PROT  --  5.3*  --   BILITOT  --  0.2*  --   ALKPHOS  --  39  --   ALT  --  15  --   AST  --  21  --   GLUCOSE 99 144* 88    Imaging/Diagnostic Tests: Nm Gi Blood Loss  04/20/2016  CLINICAL DATA:  Symptomatic anemia. GI bleed. History of angiodysplasia. 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 EXAM: NUCLEAR MEDICINE GASTROINTESTINAL BLEEDING SCAN TECHNIQUE: Sequential abdominal images were obtained following intravenous administration of Tc-27m labeled red blood cells. RADIOPHARMACEUTICALS:  Twenty-seven mCi Tc-35m in-vitro labeled red cells. COMPARISON:  CT abdomen pelvis - 12/18/2014; 12/27/2013 FINDINGS: There is a very small amount of suspected intraluminal activity seen within the suspected proximal small bowel on the initial 60 minutes cine images. No definitive abnormal activity is seen on the provided anterior projection planar images. No definitive abnormal activity is seen on the subsequent 65 - 120 minute cine or anterior projection planar images. Physiologic activity is seen within the heart, liver and major vascular structures of the abdomen and pelvis. IMPRESSION: Trace/faint amount of intraluminal activity seen only on the initial 60 minute cine images, not seen on the subsequent 65 to 120 minute images suggestive of a small, intermittent bleed. 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 Critical Value/emergent results were called by telephone at the time of interpretation on 04/20/2016 at 5:57 pm to Dr. Wendee Beavers , who verbally acknowledged these results. Electronically Signed   By: Sandi Mariscal M.D.   On: 04/20/2016 18:01   Janora Norlander, DO 04/21/2016,  7:28 AM PGY-2, [facility] Intern pager: [phone], text pages welcome

## 2016-04-21 NOTE — Progress Notes (Signed)
Pt scheduled to have an angio today - not made NPO by MD. Pt had 1 bight of grits. Radiology PA came to see pt. Unable to do this since pt ate.  Test moved to Sunday

## 2016-04-21 NOTE — Progress Notes (Signed)
[facility] ROUNDING NOTE   Subjective: She continues to have down trending hemoglobin with melena. RBC tag scan showed some blood in proximal small bowel likely duodenum at the site of hemostatic clips.    Objective: Vital signs in last 24 hours: Temp:  [98 F (36.7 C)-98.3 F (36.8 C)] 98.2 F (36.8 C) (05/06 0800) Pulse Rate:  [43-108] 43 (05/06 0400) Resp:  [14-26] 14 (05/06 0400) BP: (99-139)/(32-67) 104/51 mmHg (05/06 0400) SpO2:  [97 %-100 %] 97 % (05/06 0400) Last BM Date: 04/20/16 General: NAD Lungs:b/l clear Heart:s1s2 Abdomen: soft, no distension, non tender Ext: no edema   Intake/Output from previous day: 05/05 0701 - 05/06 0700 In: 643 [P.O.:640; I.V.:3] Out: 1725 [Urine:1725] Intake/Output this shift:     Lab Results:  Recent Labs  04/20/16 0315 04/20/16 1416 04/21/16 0240  WBC 6.3 6.7 7.3  HGB 7.4* 7.4* 7.0*  PLT 152 157 165  MCV 93.3 94.7 94.4   BMET No results for input(s): NA, K, CL, CO2, GLUCOSE, BUN, CREATININE, CALCIUM in the last 72 hours. LFT No results for input(s): PROT, ALBUMIN, AST, ALT, ALKPHOS, BILITOT, BILIDIR, IBILI in the last 72 hours. PT/INR No results for input(s): INR in the last 72 hours.    Imaging/Other results: Nm Gi Blood Loss  04/20/2016  CLINICAL DATA:  Symptomatic anemia. GI bleed. History of angiodysplasia. 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 EXAM: NUCLEAR MEDICINE GASTROINTESTINAL BLEEDING SCAN TECHNIQUE: Sequential abdominal images were obtained following intravenous administration of Tc-22m labeled red blood cells. RADIOPHARMACEUTICALS:  Twenty-seven mCi Tc-55m in-vitro labeled red cells. COMPARISON:  CT abdomen pelvis - 12/18/2014; 12/27/2013 FINDINGS: There is a very small amount of suspected intraluminal activity seen within the suspected proximal small bowel on the initial 60 minutes cine images. No definitive abnormal activity is seen on the provided  anterior projection planar images. No definitive abnormal activity is seen on the subsequent 65 - 120 minute cine or anterior projection planar images. Physiologic activity is seen within the heart, liver and major vascular structures of the abdomen and pelvis. IMPRESSION: Trace/faint amount of intraluminal activity seen only on the initial 60 minute cine images, not seen on the subsequent 65 to 120 minute images suggestive of a small, intermittent bleed. 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 Critical Value/emergent results were called by telephone at the time of interpretation on 04/20/2016 at 5:57 pm to Dr. Wendee Beavers , who verbally acknowledged these results. Electronically Signed   By: Sandi Mariscal M.D.   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 She has had an endoscopy on April 25 with 6 angiodysplasia in D2 and D3 that were treated with APC and due to persistent intermittent GI bleed repeat enteroscopy on May 3 showed small ulcer with visible vessel likely in the area of prior argon plasma quite relation was retreated with APC and hemostatic clips were placed. She continues to bleed intermittently and RBC tag scan suggestive of possible bleeding from the same site  At this point given she had 2 endoscopies with endoscopic therapy and continues to bleed repeat endoscopy is likely to be unsuccessful. Consult IR for possible embolization Monitor hemoglobin and transfuse as needed Consider IV iron infusion and by mouth iron indefinitely for replacement as  needed PPI twice a day Please call if you have any questions     K. Denzil Magnuson , MD [phone] Mon-Fri 8a-5p [phone] after 5p, weekends, holidays [facility]

## 2016-04-21 NOTE — Consult Note (Signed)
Chief Complaint: Patient was seen in consultation today for mesenteric/visceral arteriogram with possible embolization. Chief Complaint  Patient presents with  . Chest Pain  . Abnormal Lab    Referring Physician(s): Nandigam,Kavitha  Supervising Physician: Aletta Edouard  Patient Status: In-pt /  History of Present Illness: Nicole Perez is a 82 y.o. female with past medical history as listed below who was recently admitted on 4/25 with chest pain, dyspnea, weakness and anemia as well as dark stools. Subsequent endoscopy revealed angiodysplastic lesions in  the duodenum which were treated with APC. Patient was subsequently discharged on 4/27 and readmitted again on 5/1 with recurrent GI bleeding/melena. Endoscopy on 5/3 revealed nonbleeding duodenal ulcer which was treated with APC/clipping. Nuclear medicine scan on 5/5 revealed trace/faint amount of activity suggestive of a small, intermittent bleed in the proximal small bowel region. Request now received from GI for mesenteric/visceral arteriography with possible embolization.  Past Medical History  Diagnosis Date  . Essential hypertension, benign 04/18/2010  . Lichen sclerosus et atrophicus of the vulva   . BACK PAIN, CHRONIC 04/18/2010    degenerative spine disease, spinal stenosis throughout spine  . DEGENERATIVE JOINT DISEASE, HIPS 09/11/2007    Multilevel degenerative spine dz and spinal stenosis  . INSOMNIA, CHRONIC 04/18/2010  . Parotid adenoma 1990, 2012    Right parotid, recurrent parotid pleimorphic adenoma.   . Osteoarthritis, multiple sites 08/11/2013  . Cystocele 08/11/2013  . Acute renal failure ([place]) 02/23/2014  . History of pneumonia 07/26/2011  . Rectal fissure 12/16/2013  . UTI (urinary tract infection) 02/22/2014  . 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 Hearing loss sensory, bilateral 07/26/2011    Right >> Left.  Left ear hearing aid b/c work  discrimination in       R. ear is very poor. Audiologist-Stephanie Nance at [facility] in [place].  (05/10/2010)   . HYPERLIPIDEMIA 05/10/2010    Qualifier: Diagnosis of  By: McDiarmid MD, Sherren Mocha    . Incontinence overflow, urine 04/28/2014  . Incomplete emptying of bladder 02/09/2014  . Paresthesia of both feet 08/06/2014  . Orthostatic hypotension 08/06/2014  . Blepharitis 11/16/2014    Diagnosis by optometrist, Renaldo Harrison on exam 11/13/2014  . Dry eye syndrome 11/16/2014  . Glaucoma suspect 11/16/2014  . Blood in stool 12/20/2014  . Cholelithiasis   . Colon polyps 2006. 2016    adenomatous and hyperplaxtic  . H. pylori infection 2016    h pylori erosive gastritis, treated with PPI, antibiotics.   . Hiatal hernia 05/05/2015    Large Hiatal Hernia found on EGD by Dr Hilarie Fredrickson (GI in [place]) in work up of melena and (+) FOBT.  Marland Kitchen Urethral polyp 11/17/2015  . 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  ablated.   . Overflow incontinence 04/28/2014  . Complicated UTI (urinary tract infection) 01/30/2016    Past Surgical History  Procedure Laterality Date  . Total abdominal hysterectomy w/ bilateral salpingoophorectomy  1964    Hysterectomy and bilateral oopherectomy at age 57 for benign reasons  . Bladder suspension      Bladder tack x 2 (Dr Janice Norrie)  . Cystocele repair      Rectal prolapse and cyctocele adter hysterectomy requiring anterior repair Jerilynn Mages Edwinna Areola, MD)  . Rectocele repair      Rectal prolapse and cyctocele adter hysterectomy requiring anterior repair Jerilynn Mages Edwinna Areola, MD)  .  Urethral dilation    . Parotid gland tumor excision  1990    S/P excision of Right Parotid Gland Benign Tumor, 1990  . Reconstruction of nose  1994    Nasal bridge reconstruction (Dr Judie Grieve, 1994) for following  Forklift accident on job. Surgery complicated by nerve damage resulting in difficulty raising right eyebrow   . Carpal tunnel release  2010    Carpel Tunnel Release of  left wrist   03/2009 (Dr Fredna Dow): Nerve   . Cataract extraction w/ intraocular lens implant  2010    Dr Charise Killian (ophth)  . Breast lumpectomy      Lumpectomy of benign Breast lumps bilaterally, Dr Bubba Camp  . Laminectomy      S/P L4-5 Laminectomy (1987) for decompression of spinal stenosis  . Colonoscopy w/ polypectomy  2006  . Parotidectomy  08/30/11    Radene Journey, MD (ENT) for recurrent right parotid pleomorphic adenoma by frozen section  . Carpal tunnel release      right wrist  . Esophagogastroduodenoscopy N/A 04/10/2016    Procedure: ESOPHAGOGASTRODUODENOSCOPY (EGD);  Surgeon: Irene Shipper, MD;  Location: [facility] ENDOSCOPY;  Service: Endoscopy;  Laterality: N/A;  . Enteroscopy N/A 04/18/2016    Procedure: ENTEROSCOPY;  Surgeon: Mauri Pole, MD;  Location: [facility] ENDOSCOPY;  Service: Endoscopy;  Laterality: N/A;    Allergies: Nitrofurantoin  Medications: Prior to Admission medications   Medication Sig Start Date End Date Taking? Authorizing Provider  esomeprazole (NEXIUM) 40 MG capsule Take 1 capsule (40 mg total) by mouth daily. 04/16/16  Yes Olam Idler, MD  ferrous sulfate 325 (65 FE) MG tablet Take 1 tablet (325 mg total) by mouth 2 (two) times daily with a meal. 04/11/16  Yes Mercy Riding, MD  HYDROcodone-acetaminophen (NORCO) 10-325 MG tablet Take 1 tablet by mouth every 12 (twelve) hours as needed for moderate pain or severe pain. Patient taking differently: Take 1 tablet by mouth every 8 (eight) hours as needed for moderate pain or severe pain.  04/16/16  Yes Lind Covert, MD  lidocaine (XYLOCAINE) 2 % jelly Apply 1 dime size amount of gel to the area between your vagina and rectum once daily for pain. You may use up on 2 times per day if needed 03/12/16  Yes Caren Macadam, MD  magnesium hydroxide (MILK OF MAGNESIA) 400 MG/5ML suspension Take 30 mLs by mouth daily as needed for mild constipation.   Yes Historical Provider, MD  polyethylene glycol powder (GLYCOLAX/MIRALAX) powder Take  17 g by mouth daily as needed for mild constipation, moderate constipation or severe constipation. 02/14/16  Yes Blane Ohara McDiarmid, MD     Family History  Problem Relation Age of Onset  . Coronary artery disease Mother   . Coronary artery disease Sister     open heart surgery  . Diabetes type II Sister   . Stroke Father 14  . Hypertension Father   . Lupus Brother   . Heart disease Brother   . Heart attack Sister     Social History   Social History  . Marital Status: Widowed    Spouse Name: N/A  . Number of Children: N/A  . Years of Education: N/A   Occupational History  . retired    Social History Main Topics  . Smoking status: Former Smoker    Quit date: 01/14/1984  . Smokeless tobacco: Former Systems developer  . Alcohol Use: No  . Drug Use: No  . Sexual Activity: No     Comment:  hysterectomy   Other Topics Concern  . None   Social History Narrative   Has 8 children   [name], [name], involved in her care   4 brothers, 5 sisters, 3 deceased siblings   Cares for her great-grandchildren during the day   Widowed in 2009   Lives alone.   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                Review of Systems patient currently denies fever, chest pain, dyspnea, cough, back pain, nausea, vomiting. She does have some mild epigastric discomfort.  Vital Signs: BP 104/51 mmHg  Pulse 43  Temp(Src) 98.2 F (36.8 C) (Oral)  Resp 14  Ht 4\' 11"  (1.499 m)  Wt 148 lb 2.4 oz (67.2 kg)  BMI 29.91 kg/m2  SpO2 97%  LMP 12/17/1962  Physical Exam patient awake, alert. chest clear to auscultation bilaterally. Heart with bradycardic rhythm/ occasional ectopy. Abdomen soft, mild epigastric tenderness to palpation, + bowel sounds.  Mallampati Score:     Imaging: Dg Chest 2 View  04/09/2016  CLINICAL DATA:   Centralized chest pain for 1 day. Squeezing sensation, palpitations for 1 day. EXAM: CHEST  2 VIEW COMPARISON:  02/22/2014 FINDINGS: Heart and mediastinal contours are within normal limits. No focal opacities or effusions. No acute bony abnormality. Degenerative changes in the shoulders. IMPRESSION: No active cardiopulmonary disease. Electronically Signed   By: Rolm Baptise M.D.   On: 04/09/2016 15:51   Nm Gi Blood Loss  04/20/2016  CLINICAL DATA:  Symptomatic anemia. GI bleed. History of angiodysplasia. 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 EXAM: NUCLEAR MEDICINE GASTROINTESTINAL BLEEDING SCAN TECHNIQUE: Sequential abdominal images were obtained following intravenous administration of Tc-87m labeled red blood cells. RADIOPHARMACEUTICALS:  Twenty-seven mCi Tc-78m in-vitro labeled red cells. COMPARISON:  CT abdomen pelvis - 12/18/2014; 12/27/2013 FINDINGS: There is a very small amount of suspected intraluminal activity seen within the suspected proximal small bowel on the initial 60 minutes cine images. No definitive abnormal activity is seen on the provided anterior projection planar images. No definitive abnormal activity is seen on the subsequent 65 - 120 minute cine or anterior projection planar images. Physiologic activity is seen within the heart, liver and major vascular structures of the abdomen and pelvis. IMPRESSION: Trace/faint amount of intraluminal activity seen only on the initial 60 minute cine images, not seen on the subsequent 65 to 120 minute images suggestive of a small, intermittent bleed. 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 Critical Value/emergent results were called by telephone at the time of interpretation on 04/20/2016 at 5:57 pm to Dr. Wendee Beavers , who verbally acknowledged these results. Electronically Signed   By: Sandi Mariscal M.D.    On: 04/20/2016 18:01   Dg Chest Port 1 View  04/17/2016  CLINICAL DATA:  Pain between the shoulder blades and in the chest for 2 hours. Heart monitor. EXAM: PORTABLE CHEST 1 VIEW COMPARISON:  04/09/2016 FINDINGS: Normal heart size and pulmonary vascularity. No focal airspace disease or consolidation in the lungs. No blunting of costophrenic angles. No pneumothorax. Mediastinal contours appear intact. Degenerative changes in the spine and shoulders.  Calcified and tortuous aorta. IMPRESSION: No active disease. Electronically Signed   By: Lucienne Capers M.D.   On: 04/17/2016 00:11    Labs:  CBC:  Recent Labs  04/19/16 0227  04/19/16 1648 04/20/16 0315 04/20/16 1416 04/21/16 0240  WBC 7.0  --   --  6.3 6.7 7.3  HGB 6.5*  < > 8.1* 7.4* 7.4* 7.0*  HCT 20.5*  < > 25.3* 23.6* 23.0* 21.9*  PLT 161  --   --  152 157 165  < > = values in this interval not displayed.  COAGS:  Recent Labs  04/09/16 1831 04/16/16 2324 04/21/16 0911  INR 1.25 1.22 1.15    BMP:  Recent Labs  04/16/16 1649 04/16/16 2324 04/17/16 0901 04/21/16 1007  NA 138 137 139 139  K 4.3 4.0 4.5 4.8  CL 107 106 112* 108  CO2 22 20* 17* 24  GLUCOSE 99 144* 88 94  BUN 30* 33* 29* 28*  CALCIUM 8.0* 8.0* 7.9* 8.1*  CREATININE 0.78 1.06* 0.92 0.90  GFRNONAA 72 48* 57* 59*  GFRAA 83 56* >60 >60    LIVER FUNCTION TESTS:  Recent Labs  10/05/15 1504 04/09/16 1646 04/16/16 2324  BILITOT 0.3 0.2* 0.2*  AST 14 17 21   ALT 10 11* 15  ALKPHOS 52 39 39  PROT 6.6 5.5* 5.3*  ALBUMIN 3.9 3.1* 2.9*    TUMOR MARKERS: No results for input(s): AFPTM, CEA, CA199, CHROMGRNA in the last 8760 hours.  Assessment and Plan: Patient with history of persistent melena/anemia in setting of intermittent ongoing bleed from duodenal angiodysplasia and 2 recent endoscopies with APC . Request now received for mesenteric/visceral arteriogram with possible embolization. Current labs include WBC 7.3, hemoglobin 7.0, platelets 165K,  PT 14.9, INR 1.15, creatinine 0.90. Case has been reviewed by Dr. Kathlene Cote and discussed with Dr.Nandigam. Patient had breakfast this morning therefore due to need for IV conscious sedation procedure will be performed tomorrow. Details/risks of procedure, including but not limited to, internal bleeding, infection, contrast nephropathy, nontarget embolization, bowel ischemia, inability to embolize vessel, discussed with patient and daughter with their understanding and consent.   Thank you for this interesting consult.  I greatly enjoyed meeting LULIA FARR and look forward to participating in their care.  A copy of this report was sent to the requesting provider on this date.  Electronically Signed: D. Rowe Robert 04/21/2016, 1:43 PM   I spent a total of 40 minutes in face to face in clinical consultation, greater than 50% of which was counseling/coordinating care for mesenteric/visceral arteriogram with possible embolization

## 2016-04-21 NOTE — Evaluation (Signed)
Physical Therapy Evaluation Patient Details Name: Nicole Perez MRN: [id number] DOB: January 24, 1933 Today's Date: 04/21/2016   History of Present Illness  Nicole Perez is a 82 y.o. female with past medical history as listed below who was recently admitted on 4/25 with chest pain, dyspnea, weakness and anemia as well as dark stools. Subsequent endoscopy revealed angiodysplastic lesions in the duodenum which were treated with APC. Patient was subsequently discharged on 4/27 and readmitted again on 5/1 with recurrent GI bleeding/melena. Endoscopy on 5/3 revealed nonbleeding duodenal ulcer which was treated with APC/clipping. Nuclear medicine scan on 5/5 revealed trace/faint amount of activity suggestive of a small, intermittent bleed in the proximal small bowel region. Request now received from GI for mesenteric/visceral arteriography with possible embolization.  Clinical Impression  Pt admitted with above diagnosis. Pt currently with functional limitations due to the deficits listed below (see PT Problem List). Pt will benefit from continued PT as she is deconditioned.  Limited today as she had not gotten cathed and she kept urinating. Feel pt can go home with [facility] f/u as recommended below and use of RW.  Will follow acutley.  Pt will benefit from skilled PT to increase their independence and safety with mobility to allow discharge to the venue listed below.    Follow Up Recommendations Home health PT ([facility], HH[facility])    Equipment Recommendations  3in1 (PT)    Recommendations for Other Services       Precautions / Restrictions Precautions Precautions: Fall Restrictions Weight Bearing Restrictions: No      Mobility  Bed Mobility Overal bed mobility: Modified Independent             General bed mobility comments: increased time to perform, no physical assist required  Transfers Overall transfer level: Needs assistance Equipment used: Rolling walker (2 wheeled) Transfers: Sit to/from  [facility] Sit to Stand: Min assist Stand pivot transfers: Min assist       General transfer comment: min guard for stability, increased time to perform.  Upon standing, pt began urinating on floor.  Had to perform bath to clean pt.  Pt assisted with bath.  Pt able to get to toilet and back with min assist and RW.    Ambulation/Gait                Stairs            Wheelchair Mobility    Modified Rankin (Stroke Patients Only)       Balance                                             Pertinent Vitals/Pain Pain Assessment: No/denies pain  48-60 bpm, 985 RA, 119/45    Home Living Family/patient expects to be discharged to:: Private residence Living Arrangements: Alone Available Help at Discharge: Family;Available PRN/intermittently Type of Home: House Home Access: Ramped entrance     Home Layout: One level Home Equipment: Walker - 2 wheels;Walker - 4 wheels;Shower seat - built in      Prior Function Level of Independence: Independent with assistive device(s)         Comments: uses walker at hime     Hand Dominance   Dominant Hand: Right    Extremity/Trunk Assessment   Upper Extremity Assessment: Defer to OT evaluation           Lower Extremity  Assessment: Generalized weakness      Cervical / Trunk Assessment: Kyphotic  Communication   Communication: No difficulties  Cognition Arousal/Alertness: Awake/alert Behavior During Therapy: WFL for tasks assessed/performed Overall Cognitive Status: Within Functional Limits for tasks assessed                      General Comments      Exercises        Assessment/Plan    PT Assessment Patient needs continued PT services  PT Diagnosis Difficulty walking;Abnormality of gait;Acute pain   PT Problem List Decreased activity tolerance;Decreased balance;Decreased mobility;Pain  PT Treatment Interventions DME instruction;Gait training;Functional  mobility training;Therapeutic activities;Therapeutic exercise;Balance training;Stair training;Patient/family education   PT Goals (Current goals can be found in the Care Plan section) Acute Rehab PT Goals Patient Stated Goal: to go home PT Goal Formulation: With patient Time For Goal Achievement: 05/05/16 Potential to Achieve Goals: Good    Frequency Min 3X/week   Barriers to discharge Other (comment);Decreased caregiver support (all children work)      Insurance risk surveyor During Treatment: Gait belt Activity Tolerance: Patient tolerated treatment well Patient left: in bed;with call bell/phone within reach;with family/visitor present;with bed alarm set Nurse Communication: Mobility status         Time: 1040-1107 PT Time Calculation (min) (ACUTE ONLY): 27 min   Charges:   PT Evaluation $PT Eval Moderate Complexity: 1 Procedure PT Treatments $Therapeutic Activity: 8-22 mins   PT G CodesDenice Paradise 05/10/2016, 2:41 PM [employer] Acute Rehabilitation [phone] (pager)

## 2016-04-21 NOTE — Progress Notes (Signed)
FPTS Interim Progress Note  S:Patient reports that she is doing well.  She voices good understanding of the plan for tomorrow.  She notes that she is eating well.  No concerns at this time.  Family updated at bedside.  O: BP 142/60 mmHg  Pulse 79  Temp(Src) 98.2 F (36.8 C) (Oral)  Resp 16  Ht 4\' 11"  (1.499 m)  Wt 148 lb 2.4 oz (67.2 kg)  BMI 29.91 kg/m2  SpO2 100%  LMP 12/17/1962  Gen: awake, alert, NAD, pale appearing, family at bedside HEENT: pale conjunctiva Cardio: regular rate Pulm: Normal WOB on room air Psych: happy, pleasant  A/P: Nicole Perez is a 82 y.o. female here with GI bleed. S/p 1 unit pRBCs today. - IR to perform embolization tomorrow after 8am - NPO after MDN - Will plan to assess before and after procedure - CBC, PT/INR in am  Janora Norlander, DO 04/21/2016, 5:42 PM PGY-2, [place] [facility] pager [phone]

## 2016-04-21 NOTE — Progress Notes (Signed)
Text page Int med teaching service - made aware of HGB of 7.0 - noted no orders received at this time

## 2016-04-22 ENCOUNTER — Inpatient Hospital Stay (HOSPITAL_COMMUNITY): Payer: Medicare Other

## 2016-04-22 DIAGNOSIS — K644 Residual hemorrhoidal skin tags: Secondary | ICD-10-CM | POA: Insufficient documentation

## 2016-04-22 DIAGNOSIS — R9389 Abnormal findings on diagnostic imaging of other specified body structures: Secondary | ICD-10-CM

## 2016-04-22 HISTORY — DX: Abnormal findings on diagnostic imaging of other specified body structures: R93.89

## 2016-04-22 HISTORY — PX: COLONIC EMBOLIZATION: SHX1373

## 2016-04-22 LAB — COMPREHENSIVE METABOLIC PANEL
ALK PHOS: 36 U/L — AB (ref 38–126)
ALT: 12 U/L — AB (ref 14–54)
ANION GAP: 11 (ref 5–15)
AST: 16 U/L (ref 15–41)
Albumin: 2.3 g/dL — ABNORMAL LOW (ref 3.5–5.0)
BILIRUBIN TOTAL: 0.5 mg/dL (ref 0.3–1.2)
BUN: 26 mg/dL — ABNORMAL HIGH (ref 6–20)
CALCIUM: 8 mg/dL — AB (ref 8.9–10.3)
CO2: 21 mmol/L — ABNORMAL LOW (ref 22–32)
CREATININE: 0.96 mg/dL (ref 0.44–1.00)
Chloride: 107 mmol/L (ref 101–111)
GFR, EST NON AFRICAN AMERICAN: 54 mL/min — AB (ref >60)
Glucose, Bld: 115 mg/dL — ABNORMAL HIGH (ref 65–99)
Potassium: 4.4 mmol/L (ref 3.5–5.1)
SODIUM: 139 mmol/L (ref 135–145)
TOTAL PROTEIN: 4.6 g/dL — AB (ref 6.5–8.1)

## 2016-04-22 LAB — CBC
HCT: 24.3 % — ABNORMAL LOW (ref 36.0–46.0)
HEMOGLOBIN: 7.7 g/dL — AB (ref 12.0–15.0)
MCH: 28.7 pg (ref 26.0–34.0)
MCHC: 31.7 g/dL (ref 30.0–36.0)
MCV: 90.7 fL (ref 78.0–100.0)
Platelets: 157 10*3/uL (ref 150–400)
RBC: 2.68 MIL/uL — AB (ref 3.87–5.11)
RDW: 18.9 % — ABNORMAL HIGH (ref 11.5–15.5)
WBC: 6.8 10*3/uL (ref 4.0–10.5)

## 2016-04-22 LAB — PROTIME-INR
INR: 1.12 (ref 0.00–1.49)
PROTHROMBIN TIME: 14.6 s (ref 11.6–15.2)

## 2016-04-22 LAB — PREPARE RBC (CROSSMATCH)

## 2016-04-22 LAB — HEMOGLOBIN AND HEMATOCRIT, BLOOD
HCT: 23 % — ABNORMAL LOW (ref 36.0–46.0)
HEMOGLOBIN: 7.2 g/dL — AB (ref 12.0–15.0)

## 2016-04-22 MED ORDER — LIDOCAINE HCL 1 % IJ SOLN
INTRAMUSCULAR | Status: AC
  Filled 2016-04-22: qty 20

## 2016-04-22 MED ORDER — MIDAZOLAM HCL 2 MG/2ML IJ SOLN
INTRAMUSCULAR | Status: AC | PRN
  Administered 2016-04-22: .25 mg via INTRAVENOUS
  Administered 2016-04-22 (×2): 0.5 mg via INTRAVENOUS
  Administered 2016-04-22: .25 mg via INTRAVENOUS

## 2016-04-22 MED ORDER — IOPAMIDOL (ISOVUE-300) INJECTION 61%
INTRAVENOUS | Status: AC
  Administered 2016-04-22: 100 mL
  Filled 2016-04-22: qty 200

## 2016-04-22 MED ORDER — LIDOCAINE HCL 1 % IJ SOLN
INTRAMUSCULAR | Status: AC | PRN
  Administered 2016-04-22: 8 mL

## 2016-04-22 MED ORDER — FENTANYL CITRATE (PF) 100 MCG/2ML IJ SOLN
INTRAMUSCULAR | Status: AC | PRN
  Administered 2016-04-22 (×6): 12.5 ug via INTRAVENOUS

## 2016-04-22 MED ORDER — NITROGLYCERIN 0.4 MG SL SUBL
SUBLINGUAL_TABLET | SUBLINGUAL | Status: AC
  Filled 2016-04-22: qty 1

## 2016-04-22 MED ORDER — HYDROCODONE-ACETAMINOPHEN 10-325 MG PO TABS: 1.0000 | ORAL_TABLET | Freq: Three times a day (TID) | ORAL | Status: DC

## 2016-04-22 MED ORDER — IOPAMIDOL (ISOVUE-300) INJECTION 61%
INTRAVENOUS | Status: AC
  Administered 2016-04-22: 50 mL
  Filled 2016-04-22: qty 50

## 2016-04-22 MED ORDER — MORPHINE SULFATE (PF) 2 MG/ML IV SOLN: 1.0000 mg | INTRAVENOUS | Status: DC | PRN

## 2016-04-22 MED ORDER — SODIUM CHLORIDE 0.9 % IV SOLN
Freq: Once | INTRAVENOUS | Status: AC
  Administered 2016-04-22: 22:00:00 via INTRAVENOUS

## 2016-04-22 MED ORDER — MIDAZOLAM HCL 2 MG/2ML IJ SOLN
INTRAMUSCULAR | Status: AC
  Filled 2016-04-22: qty 2

## 2016-04-22 MED ORDER — NITROGLYCERIN 0.4 MG SL SUBL
SUBLINGUAL_TABLET | SUBLINGUAL | Status: AC
  Administered 2016-04-22: 0.4 mg
  Filled 2016-04-22: qty 1

## 2016-04-22 MED ORDER — HYDROCODONE-ACETAMINOPHEN 10-325 MG PO TABS
1.0000 | ORAL_TABLET | Freq: Four times a day (QID) | ORAL | Status: DC | PRN
  Administered 2016-04-22 – 2016-05-03 (×24): 1 via ORAL
  Filled 2016-04-22 (×25): qty 1

## 2016-04-22 MED ORDER — FENTANYL CITRATE (PF) 100 MCG/2ML IJ SOLN
INTRAMUSCULAR | Status: AC
  Filled 2016-04-22: qty 2

## 2016-04-22 NOTE — Sedation Documentation (Signed)
Patient denies pain and is resting comfortably.  

## 2016-04-22 NOTE — Progress Notes (Signed)
04/22/16  1315  Paged Family medicine in regards to patients HR 45 since returning from IR. Waiting response.

## 2016-04-22 NOTE — Progress Notes (Signed)
Family Medicine Teaching Service Daily Progress Note Intern Pager: [phone]  Patient name: Nicole Perez Medical record number: [id number] Date of birth: Jun 29, 1933 Age: 82 y.o. Gender: female  Primary Care Provider: MCDIARMID,TODD D, MD Consultants: GI and Cardiology Code Status: DNR  Pt Overview and Major Events to Date:  5/2 admitted with symptomatic anemia in the setting of GI bleed and received 2 units 5/3 push enterescopy 5/4 transfused 1 unit pRBCs 5/5 tagged RBC scan 5/6 transfused 1 unit pRBCs  Assessment and Plan: Nicole Perez is a 82 y.o. female presenting with symptomatic anemia. PMH is significant for recent hospitalization for anemia/ GI bleed/ Angiodysplasia, hypertension, neurogenic bladder and OA  # Symptomatic anemia 2/2 GI bleed: Hgb 5.5>2u>8.4>7.3>6.5>1u>8.1>7.4>>7.0>1u>7.7.  Push enteroscopy on 5/3 revealed one non-bleeding duodenal ulcer treated with argon plasma coagulation and clip placement.  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  - c/s [facility] rec: PT/INR, c/s to IR for embolization, Continue PO Protonix 40 twice a day  - c/s IR: Patient scheduled for arteriography and embolization of the GDA this am. - Telemetry - VS per floor protocol - Protonix 40mg  po BID - pm H/H ordered  # Chest pain: resolved. EKG with ST depression in lateral leads which has improved in the morning likely demand ischemia in the setting of anemia.Troponin negative x2  - Appreciate card recs, Nuclear scan as outpt - will avoid ASA in the setting of current GI bleed  # AKI: resolved. Cr 0.96. Baseline appears to be at 0.78.  # HTN: hypotensive with diastolic BPs in [id number] and [id number]. On lisinopril and HCTZ at home - Holding home meds - Monitor BP  # Neurogenic bladder: performs self caths at home. No dysuria but has suprapubic TTP on exam.UA with many bacteria, small LE and positive  nitrite. Urine culture with E. Coli sensitive to Cefazolin - I &O cath prn - CTX 5/2>5/4 - Keflex 5/4>5/8  # Osteoarthritis: compalains leg pain overnight. Also some numbness. History of back & LE OA. On Lorcet at home - Home Norco 10 BID prn - Avoid NSAIDs in the setting of GI bleed - Bowel regimen (miralax and MOM) - Aspercreme twice a day as needed - Consider gabapentin if no improvement  FEN/GI:  NPO until after procedure. PPI BID  Prophylaxis: SCDs in the setting of GI bleeding  Disposition: SDU as Hgb continues to drop  Subjective:  Patient notes that she is feeling well this am.  She is ready to get her procedure done.  Continues to have intermittent epigastric pain.  No BM since Sunday.  Notes that she normally goes q3 days.  Objective: Temp:  [98 F (36.7 C)-98.7 F (37.1 C)] 98.1 F (36.7 C) (05/07 0320) Pulse Rate:  [55-79] 61 (05/06 2003) Resp:  [14-23] 14 (05/07 0400) BP: (99-142)/(44-95) 99/48 mmHg (05/07 0400) SpO2:  [93 %-100 %] 97 % (05/07 0400) Physical Exam: GEN: lying in bed, appears well, NAD HEENT: pale conjunctiva, MMM  CVS: occ skipped beats, normal s1 and s2, no murmurs, no edema RESP: normal work of breathing, no crackles or wheeze GI: normal bowel sound, soft, + tender to palpation over epigastric area, non-distended NEURO: alert and oriented x4, no gross defecits  PSYCH: good affect, pleasant  Laboratory:  Recent Labs Lab 04/20/16 1416 04/21/16 0240 04/22/16 0340  WBC 6.7 7.3 6.8  HGB 7.4* 7.0* 7.7*  HCT 23.0* 21.9* 24.3*  PLT 157 165 157    Recent Labs Lab 04/16/16 2324 04/17/16 0901 04/21/16 1007 04/22/16 0340  NA 137 139 139 139  K 4.0 4.5 4.8 4.4  CL 106 112* 108 107  CO2 20* 17* 24 21*  BUN 33* 29* 28* 26*  CREATININE 1.06* 0.92 0.90 0.96  CALCIUM 8.0* 7.9* 8.1* 8.0*  PROT 5.3*  --   --  4.6*  BILITOT 0.2*  --   --  0.5  ALKPHOS 39  --   --  36*  ALT 15  --   --  12*  AST 21  --   --  16  GLUCOSE 144* 88 94 115*     Imaging/Diagnostic Tests: Nm Gi Blood Loss  04/20/2016  CLINICAL DATA:  Symptomatic anemia. GI bleed. History of angiodysplasia. 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 EXAM: NUCLEAR MEDICINE GASTROINTESTINAL BLEEDING SCAN TECHNIQUE: Sequential abdominal images were obtained following intravenous administration of Tc-61m labeled red blood cells. RADIOPHARMACEUTICALS:  Twenty-seven mCi Tc-11m in-vitro labeled red cells. COMPARISON:  CT abdomen pelvis - 12/18/2014; 12/27/2013 FINDINGS: There is a very small amount of suspected intraluminal activity seen within the suspected proximal small bowel on the initial 60 minutes cine images. No definitive abnormal activity is seen on the provided anterior projection planar images. No definitive abnormal activity is seen on the subsequent 65 - 120 minute cine or anterior projection planar images. Physiologic activity is seen within the heart, liver and major vascular structures of the abdomen and pelvis. IMPRESSION: Trace/faint amount of intraluminal activity seen only on the initial 60 minute cine images, not seen on the subsequent 65 to 120 minute images suggestive of a small, intermittent bleed. 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 Critical Value/emergent results were called by telephone at the time of interpretation on 04/20/2016 at 5:57 pm to Dr. Wendee Beavers , who verbally acknowledged these results. Electronically Signed   By: Sandi Mariscal M.D.   On: 04/20/2016 18:01   Janora Norlander, DO 04/22/2016, 6:01 AM PGY-2, [facility] Intern pager: [phone], text pages welcome

## 2016-04-22 NOTE — Progress Notes (Signed)
Went to check on Ms. Pickrel this pm after having her emoblization procedure today. She has been doing ok for the most part, but devloped some slight bradycardia along with some epigastric abdominal pain this pm. She says that she is unable to get comfortable. She does not describe it as chest pain and she has no shortness of breath. She does not feel palpitations. She was given milk of magnesia and maalox. She is getting protonix. She denies diaphoresis.   O:  RRR with infrequent sinus pauses, additionally with infrequent dropped beats.  Abd: TTP over epigastrum, no guarding, no rebound tenderness. +BS  A/P: I suspect that her pain is related to post-embolization ischemia. She has not had much pain until now and it is minimally relieved with maalox, milk of magnesia etc. Additionall, pain could also be cardiac though much less likely with TTP. She has not typical / other atypical symptoms otherwise. Hgb was 7.2 this pm and down from pre-transfusion h/h this morning.  - Transfuse 1 unit - Oxycodone prn 8 hours for pain - Continue maalox / milk of magnesia as needed.  - If pain remains uncontrolled or worsens. Low threshold to check lactic acid, and call IR if significantly worsening.  - If develops typical / atypical symptoms would cycle cardiac enzymes.  - Continue to monitor closely for now.   Paula Compton, MD Family Medicine - PGY 2

## 2016-04-22 NOTE — Progress Notes (Signed)
In and out cath done.  Right groin is level 1 with positive pedal pulses.

## 2016-04-22 NOTE — Procedures (Signed)
Interventional Radiology Procedure Note  Procedure:  Celiac and SMA visceral arteriography; Embolization of pancreaticoduodenal arteries  Complications:  None   Estimated Blood Loss: < 10 mL  Findings:  In region of 2 endoclips near third portion of duodenum, abnormal vascularity present supplied by pancreaticoduodenal branches off of proximal SMA.  The GDA actually does not supply this area.  Two separate 3rd order pancreaticoduodenal branches were successfully occluded with coils, resulting in marked diminished blood supply to region and no further abnormal vascularity.  Impossible to completely cut off blood supply to this region w/o risk of significant ischemia.  Will follow for any further evidence of bleeding.  Venetia Night. Kathlene Cote, M.D Pager:  [phone]

## 2016-04-23 DIAGNOSIS — K31811 Angiodysplasia of stomach and duodenum with bleeding: Secondary | ICD-10-CM

## 2016-04-23 LAB — BASIC METABOLIC PANEL
Anion gap: 8 (ref 5–15)
BUN: 24 mg/dL — AB (ref 6–20)
CHLORIDE: 105 mmol/L (ref 101–111)
CO2: 24 mmol/L (ref 22–32)
Calcium: 7.8 mg/dL — ABNORMAL LOW (ref 8.9–10.3)
Creatinine, Ser: 0.75 mg/dL (ref 0.44–1.00)
GFR calc Af Amer: >60 mL/min (ref >60)
GFR calc non Af Amer: >60 mL/min (ref >60)
GLUCOSE: 91 mg/dL (ref 65–99)
POTASSIUM: 4.5 mmol/L (ref 3.5–5.1)
Sodium: 137 mmol/L (ref 135–145)

## 2016-04-23 LAB — CBC
HCT: 25.6 % — ABNORMAL LOW (ref 36.0–46.0)
HEMATOCRIT: 27.3 % — AB (ref 36.0–46.0)
HEMOGLOBIN: 8.3 g/dL — AB (ref 12.0–15.0)
Hemoglobin: 8.8 g/dL — ABNORMAL LOW (ref 12.0–15.0)
MCH: 27.9 pg (ref 26.0–34.0)
MCH: 28.5 pg (ref 26.0–34.0)
MCHC: 32.4 g/dL (ref 30.0–36.0)
MCHC: 32.2 g/dL (ref 30.0–36.0)
MCV: 85.9 fL (ref 78.0–100.0)
MCV: 88.3 fL (ref 78.0–100.0)
PLATELETS: 144 10*3/uL — AB (ref 150–400)
Platelets: 164 10*3/uL (ref 150–400)
RBC: 2.98 MIL/uL — AB (ref 3.87–5.11)
RBC: 3.09 MIL/uL — ABNORMAL LOW (ref 3.87–5.11)
RDW: 18.3 % — ABNORMAL HIGH (ref 11.5–15.5)
RDW: 19.1 % — AB (ref 11.5–15.5)
WBC: 7.7 10*3/uL (ref 4.0–10.5)
WBC: 8.9 10*3/uL (ref 4.0–10.5)

## 2016-04-23 LAB — TYPE AND SCREEN
ABO/RH(D): O POS
Antibody Screen: NEGATIVE
Unit division: 0
Unit division: 0

## 2016-04-23 NOTE — Progress Notes (Signed)
Physical Therapy Treatment Patient Details Name: Nicole Perez MRN: [id number] DOB: 1933-12-14 Today's Date: 04/23/2016    History of Present Illness Nicole Perez is a 82 y.o. female with past medical history as listed below who was recently admitted on 4/25 with chest pain, dyspnea, weakness and anemia as well as dark stools. Subsequent endoscopy revealed angiodysplastic lesions in the duodenum which were treated with APC. Patient was subsequently discharged on 4/27 and readmitted again on 5/1 with recurrent GI bleeding/melena. Endoscopy on 5/3 revealed nonbleeding duodenal ulcer which was treated with APC/clipping. Nuclear medicine scan on 5/5 revealed trace/faint amount of activity suggestive of a small, intermittent bleed in the proximal small bowel region. Request now received from GI for mesenteric/visceral arteriography with possible embolization.    PT Comments    Pt admitted with above diagnosis. Pt currently with functional limitations due to balance and endurance deficits. Pt was able to ambulate with RW although initially hesitant.  Pt with good overall safety with RW even though she stated her legs could buckle, they had no instability with ambulation today.  Pt had daughter and great grandchildren in the room.  Will continue mobility as pt allows.  Pt will benefit from skilled PT to increase their independence and safety with mobility to allow discharge to the venue listed below.    Follow Up Recommendations  Home health PT ([facility], HHOT)     Equipment Recommendations  3in1 (PT)    Recommendations for Other Services       Precautions / Restrictions Precautions Precautions: Fall Restrictions Weight Bearing Restrictions: No    Mobility  Bed Mobility Overal bed mobility: Modified Independent             General bed mobility comments: increased time to perform, no physical assist required  Transfers Overall transfer level: Needs assistance Equipment used: Rolling  walker (2 wheeled) Transfers: Sit to/from Stand Sit to Stand: Min guard         General transfer comment: min guard for stability, increased time to perform.   Ambulation/Gait Ambulation/Gait assistance: Min guard Ambulation Distance (Feet): 110 Feet Assistive device: Rolling walker (2 wheeled) Gait Pattern/deviations: Step-through pattern;Decreased stride length;Trunk flexed Gait velocity: decreased Gait velocity interpretation: Below normal speed for age/gender General Gait Details: decreased activity tolerance, modest instability noted, 2 standing rest breaks   Stairs            Wheelchair Mobility    Modified Rankin (Stroke Patients Only)       Balance Overall balance assessment: History of Falls;Needs assistance         Standing balance support: Bilateral upper extremity supported;During functional activity Standing balance-Leahy Scale: Poor Standing balance comment: relies on UE support for balance             High level balance activites: Direction changes;Turns;Sudden stops High Level Balance Comments: min guard assist for above    Cognition Arousal/Alertness: Awake/alert Behavior During Therapy: WFL for tasks assessed/performed Overall Cognitive Status: Within Functional Limits for tasks assessed                      Exercises      General Comments        Pertinent Vitals/Pain Pain Assessment: No/denies pain  VSS    Home Living                      Prior Function            PT  Goals (current goals can now be found in the care plan section) Progress towards PT goals: Progressing toward goals    Frequency  Min 3X/week    PT Plan Current plan remains appropriate    Co-evaluation             End of Session Equipment Utilized During Treatment: Gait belt Activity Tolerance: Patient tolerated treatment well Patient left: in chair;with call bell/phone within reach;with chair alarm set;with family/visitor  present     Time: [id number] PT Time Calculation (min) (ACUTE ONLY): 14 min  Charges:  $Gait Training: 8-22 mins                    G CodesDenice Paradise 05-19-16, 4:32 PM [facility] [facility] [phone] (pager)

## 2016-04-23 NOTE — Care Management Important Message (Signed)
Important Message  Patient Details  Name: Nicole Perez MRN: [id number] Date of Birth: 1933/08/15   Medicare Important Message Given:  Yes    Dinesh Ulysse T, RN 04/23/2016, 1:54 PM

## 2016-04-23 NOTE — Care Management Note (Signed)
Case Management Note  Patient Details  Name: CLANCEY ROUTON MRN: [id number] Date of Birth: 28-Jun-1933  Subjective/Objective:     Pt lives alone but states that dtrs are making arrangements to take turns staying with her until she is stronger.  Pt also reports that [facility] had made an initial visit the day before she was readmitted to the hospital and she would like them to provide the PT, OT, and aide services that she needs.  CM will contact [facility].                           Expected Discharge Plan:  [place]  Discharge planning Services  CM Consult  Post Acute Care Choice:  Resumption of Svcs/PTA Provider  HH Arranged:  PT, OT, Nurse's Aide HH Agency:  [facility]  Status of Service:  In process, will continue to follow  Medicare Important Message Given:  Yes  Girard Cooter, RN 04/23/2016, 2:27 PM

## 2016-04-23 NOTE — Progress Notes (Signed)
Family Medicine Teaching Service Daily Progress Note Intern Pager: [phone]  Patient name: Nicole Perez Medical record number: [id number] Date of birth: Sep 23, 1933 Age: 82 y.o. Gender: female  Primary Care Provider: MCDIARMID,TODD D, MD Consultants: GI, Cardiology, and IR  Code Status: DNR  Pt Overview and Major Events to Date:  5/2 admitted with symptomatic anemia in the setting of GI bleed and received 2 units 5/3 push enterescopy 5/4 transfused 1 unit pRBCs 5/5 tagged RBC scan 5/6 transfused 1 unit pRBCs 5/7 transfused 1 units pRBCs  Assessment and Plan: Nicole Perez is a 82 y.o. female presenting with symptomatic anemia. PMH is significant for recent hospitalization for anemia/ GI bleed/ Angiodysplasia, hypertension, neurogenic bladder and OA  # Symptomatic anemia 2/2 GI bleed: Hgb 5.5>2u>8.4>7.3>6.5>1u>8.1>7.4>>7.0>1u>7.7>7.2>1 u >8.3   Push enteroscopy on 5/3 revealed one non-bleeding duodenal ulcer treated with argon plasma coagulation and clip placement.  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 5/7. Embolization of pancreaticoduodenal arteries - Continue PO Protonix 40 twice a day  - Telemetry - VS per floor protocol - CBC this afternoon  - IR and GI consulted, recs  - CBC afternoon and then AM CBC   # Epigastric Abdominal Pain: Patient no longer having pain this AM. - Will continue to follow closely  - IR to weigh in on this, to ensure no ischemia from embolization   # Chest pain: None. EKG with ST depression in lateral leads which has improved in the morning likely demand ischemia in the setting of anemia.Troponin negative x2  - Appreciate card recs, Nuclear scan as outpt - will avoid ASA in the setting of current GI bleed  # HTN: Holding for now due to blood pressures. lisinopril and HCTZ at home - Holding home meds - Monitor BP  # Neurogenic bladder: performs  self caths at home. No dysuria but has suprapubic TTP on exam.UA with many bacteria, small LE and positive nitrite. Urine culture with E. Coli sensitive to Cefazolin - I &O cath prn - CTX 5/2>5/4 - Keflex 5/4>5/8  # Osteoarthritis: Foot and leg pain. History of back & LE OA. On Lorcet at home - Home Norco 10 BID prn - Avoid NSAIDs in the setting of GI bleed - Bowel regimen (miralax and MOM) - Aspercreme twice a day as needed - Consider gabapentin if no improvement  FEN/GI:  Diet soft foods, possibly advance today   Prophylaxis: SCDs in the setting of GI bleeding  Disposition: SDU   Subjective:  Patient had epigastric overnight. She said this was about 8/10 epigastric. By this morning pain had resolved. Patient also received a dose of Norco at 6:28 AM. Patient has not had a bowel movement yet.   Objective: Temp:  [97.6 F (36.4 C)-98.5 F (36.9 C)] 98.2 F (36.8 C) (05/08 0757) Pulse Rate:  [42-84] 45 (05/08 0757) Resp:  [11-24] 16 (05/08 0757) BP: (96-144)/(42-66) 125/59 mmHg (05/08 0757) SpO2:  [93 %-100 %] 97 % (05/08 0757) Physical Exam: GEN: lying in bed, appears well, NAD CVS: RRR,  normal s1 and s2, no murmurs, no edema RESP: normal work of breathing, no crackles or wheeze GI: normal bowel sound, soft, + tender to palpation over epigastric area, non-distended NEURO: alert and oriented x4, no gross defecits   Laboratory:  Recent Labs Lab 04/21/16 0240 04/22/16 0340 04/22/16 1547 04/23/16 0540  WBC 7.3 6.8  --  7.7  HGB 7.0*  7.7* 7.2* 8.3*  HCT 21.9* 24.3* 23.0* 25.6*  PLT 165 157  --  144*    Recent Labs Lab 04/16/16 2324  04/21/16 1007 04/22/16 0340 04/23/16 0540  NA 137  < > 139 139 137  K 4.0  < > 4.8 4.4 4.5  CL 106  < > 108 107 105  CO2 20*  < > 24 21* 24  BUN 33*  < > 28* 26* 24*  CREATININE 1.06*  < > 0.90 0.96 0.75  CALCIUM 8.0*  < > 8.1* 8.0* 7.8*  PROT 5.3*  --   --  4.6*  --   BILITOT 0.2*  --   --  0.5  --   ALKPHOS 39  --   --   36*  --   ALT 15  --   --  12*  --   AST 21  --   --  16  --   GLUCOSE 144*  < > 94 115* 91  < > = values in this interval not displayed.  Imaging/Diagnostic Tests: Ir Angiogram Visceral Selective  04/22/2016  CLINICAL DATA:  Refractory upper GI bleed from bleeding duodenal angiodysplasia with two prior endoscopic laser treatments. The patient continues to have bleeding requiring transfusion and now presents for arteriography with possible embolization to treat persistent hemorrhage. EXAM: UTERINE FIBROID EMBOLIZATION ANESTHESIA/SEDATION: 1.5 Mg IV Versed; 75 mcg IV Fentanyl. Total Moderate Sedation Time: 2 hours and 20 minutes. The patient's level of consciousness and physiologic status were continuously monitored during the procedure by Radiology nursing. CONTRAST:  150 mL Isovue-300 FLUOROSCOPY TIME:  58 minutes and 18 seconds. PROCEDURE: The procedure, risks, benefits, and alternatives were explained to the patient. Questions regarding the procedure were encouraged and answered. The patient understands and consents to the procedure. A time-out was performed prior to initiating the procedure. The right groin was prepped with chlorhexidine in a sterile fashion, and a sterile drape was applied covering the operative field. A sterile gown and sterile gloves were used for the procedure. Local anesthesia was provided with 1% Lidocaine. Ultrasound was used to confirm patency of the right common femoral artery. After small skin incision, a 21 gauge needle was advanced into the right common femoral artery under direct ultrasound guidance. Ultrasound image documentation was performed. After establishing guide wire access, a 5-French sheath was placed. A 5 Fr Cobra catheter was advanced over a guidewire into the distal abdominal aorta. The catheter was then used to selectively catheterize the superior mesenteric artery. Selective arteriography was performed. The Cobra catheter was then advanced into the celiac axis  and selective arteriography performed. The Cobra catheter was then reintroduced into the superior mesenteric artery and advanced into the proximal SMA trunk. Additional arteriography was performed. A Renegade STC micro catheter was then advanced through the 5 French catheter and into a third order branch off of the superior mesenteric artery at the level of pancreaticoduodenal supply. Selective arteriography was performed. Transcatheter embolization was then performed through the micro catheter with introduction of 2 mm x 4 cm, 2 mm x 4 cm and 3 mm x 6 cm Interlock soft coils. Additional arteriography was performed through the micro catheter after embolization of this branch artery. Attempt was made to catheterize an additional third order pancreaticoduodenal branch off of the SMA with the same catheter system. Ultimately, a 75 [place] Sos catheter was used to catheterize the SMA trunk followed by advancement of a Lantern micro catheter. After selective catheterization of a third order pancreaticoduodenal branch,  selective arteriography was performed. Transcatheter embolization was then performed of this branch via the micro catheter with introduction of 2 mm x 4 cm, 2 mm x 2 cm, 2 mm x 4 cm and 3 mm x 5 cm Ruby soft coils. Additional arteriography was performed through the micro catheter after embolization. The micro catheter was further retracted and additional arteriography performed at the level of a second order trunk off of the SMA. Catheters were removed. Oblique arteriography centered over the femoral head was performed through the right femoral sheath prior to use of a closure device. Right femoral arteriotomy hemostasis: Cordis Exoseal COMPLICATIONS: None. FINDINGS: Initial first order SMA arteriography demonstrates prominent pancreaticoduodenal vessels off of the first second order trunk of the SMA which extends to the regions of the second, third and fourth portions of the duodenum. There are two endo  clips in close proximity to each other at the level of the third portion of the duodenum. Focal areas of hypervascular blush are noted in the second and third portions of the duodenum which correlate in location to the described areas of angiodysplasia by prior endoscopy. Other SMA supply is normal in appearance without evidence of additional hypervascular abnormalities or AV malformations. Supply to the rest of the small bowel and proximal colon appears normal. Additional selective celiac arteriography demonstrates normal anatomy with normally patent celiac trunk, a widely patent left gastric artery and normally patent common hepatic and splenic arteries. The gastroduodenal artery is the first major branch off of the common hepatic artery and supplies branches to the region of the duodenal bulb and distal stomach. No branches are seen extending to the region of the endo clips and no hypervascular areas in the duodenum are identified with celiac injection. For this reason, embolization was targeted to the pancreaticoduodenal arcade off of the superior mesenteric artery. The pancreaticoduodenal trunk off of the SMA demonstrates a superior branch as well as middle and inferior branches. Based on arteriography, hypervascular areas of abnormality were predominantly supplied by the superior and inferior branches. The inferior branch was initially catheterized with selective arteriography demonstrating subtle hypervascular abnormality near the 2 adjacent endoscopic clips. This vessel was successfully embolized resulting in occlusion of antegrade flow with proximal injection. The superior pancreaticoduodenal branch supplying the region of the second and third portions of the duodenum was extremely difficult to catheterize due to tortuous course. Ultimately, different catheter systems did allow selective catheterization and coil embolization resulting in cessation of antegrade flow in this branch. Completion arteriography  demonstrates markedly reduced vascularity of the duodenum with persistent patent supply via the middle pancreaticoduodenal branch which supplies the region of the third portion of the duodenum. Additional branches supplying the region of the fourth portion of the duodenum also remained normally patent on completion. Adequate hemostasis was achieved at the femoral arteriotomy site. IMPRESSION: The region of abnormal duodenal bleeding due to areas of angiodysplasia visualized and treated by endoscopy and also marked by endoscopic clips were supplied by branches of the pancreaticoduodenal arcade off of the proximal SMA. The gastroduodenal artery did not appear to supply this region by arteriography. Two separate pancreaticoduodenal third order arterial branches were successfully occluded with embolization coils placed via micro catheters. This resulted in decreased vascularity to the second and third portions of the duodenum. Electronically Signed   By: Irish LackGlenn  Yamagata M.D.   On: 04/22/2016 14:57   Ir Angiogram Visceral Selective  04/22/2016  CLINICAL DATA:  Refractory upper GI bleed from bleeding duodenal angiodysplasia with two prior  endoscopic laser treatments. The patient continues to have bleeding requiring transfusion and now presents for arteriography with possible embolization to treat persistent hemorrhage. EXAM: UTERINE FIBROID EMBOLIZATION ANESTHESIA/SEDATION: 1.5 Mg IV Versed; 75 mcg IV Fentanyl. Total Moderate Sedation Time: 2 hours and 20 minutes. The patient's level of consciousness and physiologic status were continuously monitored during the procedure by Radiology nursing. CONTRAST:  150 mL Isovue-300 FLUOROSCOPY TIME:  58 minutes and 18 seconds. PROCEDURE: The procedure, risks, benefits, and alternatives were explained to the patient. Questions regarding the procedure were encouraged and answered. The patient understands and consents to the procedure. A time-out was performed prior to initiating the  procedure. The right groin was prepped with chlorhexidine in a sterile fashion, and a sterile drape was applied covering the operative field. A sterile gown and sterile gloves were used for the procedure. Local anesthesia was provided with 1% Lidocaine. Ultrasound was used to confirm patency of the right common femoral artery. After small skin incision, a 21 gauge needle was advanced into the right common femoral artery under direct ultrasound guidance. Ultrasound image documentation was performed. After establishing guide wire access, a 5-French sheath was placed. A 5 Fr Cobra catheter was advanced over a guidewire into the distal abdominal aorta. The catheter was then used to selectively catheterize the superior mesenteric artery. Selective arteriography was performed. The Cobra catheter was then advanced into the celiac axis and selective arteriography performed. The Cobra catheter was then reintroduced into the superior mesenteric artery and advanced into the proximal SMA trunk. Additional arteriography was performed. A Renegade STC micro catheter was then advanced through the 5 French catheter and into a third order branch off of the superior mesenteric artery at the level of pancreaticoduodenal supply. Selective arteriography was performed. Transcatheter embolization was then performed through the micro catheter with introduction of 2 mm x 4 cm, 2 mm x 4 cm and 3 mm x 6 cm Interlock soft coils. Additional arteriography was performed through the micro catheter after embolization of this branch artery. Attempt was made to catheterize an additional third order pancreaticoduodenal branch off of the SMA with the same catheter system. Ultimately, a 73 [place] Sos catheter was used to catheterize the SMA trunk followed by advancement of a Lantern micro catheter. After selective catheterization of a third order pancreaticoduodenal branch, selective arteriography was performed. Transcatheter embolization was then  performed of this branch via the micro catheter with introduction of 2 mm x 4 cm, 2 mm x 2 cm, 2 mm x 4 cm and 3 mm x 5 cm Ruby soft coils. Additional arteriography was performed through the micro catheter after embolization. The micro catheter was further retracted and additional arteriography performed at the level of a second order trunk off of the SMA. Catheters were removed. Oblique arteriography centered over the femoral head was performed through the right femoral sheath prior to use of a closure device. Right femoral arteriotomy hemostasis: Cordis Exoseal COMPLICATIONS: None. FINDINGS: Initial first order SMA arteriography demonstrates prominent pancreaticoduodenal vessels off of the first second order trunk of the SMA which extends to the regions of the second, third and fourth portions of the duodenum. There are two endo clips in close proximity to each other at the level of the third portion of the duodenum. Focal areas of hypervascular blush are noted in the second and third portions of the duodenum which correlate in location to the described areas of angiodysplasia by prior endoscopy. Other SMA supply is normal in appearance without evidence of additional  hypervascular abnormalities or AV malformations. Supply to the rest of the small bowel and proximal colon appears normal. Additional selective celiac arteriography demonstrates normal anatomy with normally patent celiac trunk, a widely patent left gastric artery and normally patent common hepatic and splenic arteries. The gastroduodenal artery is the first major branch off of the common hepatic artery and supplies branches to the region of the duodenal bulb and distal stomach. No branches are seen extending to the region of the endo clips and no hypervascular areas in the duodenum are identified with celiac injection. For this reason, embolization was targeted to the pancreaticoduodenal arcade off of the superior mesenteric artery. The  pancreaticoduodenal trunk off of the SMA demonstrates a superior branch as well as middle and inferior branches. Based on arteriography, hypervascular areas of abnormality were predominantly supplied by the superior and inferior branches. The inferior branch was initially catheterized with selective arteriography demonstrating subtle hypervascular abnormality near the 2 adjacent endoscopic clips. This vessel was successfully embolized resulting in occlusion of antegrade flow with proximal injection. The superior pancreaticoduodenal branch supplying the region of the second and third portions of the duodenum was extremely difficult to catheterize due to tortuous course. Ultimately, different catheter systems did allow selective catheterization and coil embolization resulting in cessation of antegrade flow in this branch. Completion arteriography demonstrates markedly reduced vascularity of the duodenum with persistent patent supply via the middle pancreaticoduodenal branch which supplies the region of the third portion of the duodenum. Additional branches supplying the region of the fourth portion of the duodenum also remained normally patent on completion. Adequate hemostasis was achieved at the femoral arteriotomy site. IMPRESSION: The region of abnormal duodenal bleeding due to areas of angiodysplasia visualized and treated by endoscopy and also marked by endoscopic clips were supplied by branches of the pancreaticoduodenal arcade off of the proximal SMA. The gastroduodenal artery did not appear to supply this region by arteriography. Two separate pancreaticoduodenal third order arterial branches were successfully occluded with embolization coils placed via micro catheters. This resulted in decreased vascularity to the second and third portions of the duodenum. Electronically Signed   By: Aletta Edouard M.D.   On: 04/22/2016 14:57   Ir Angiogram Selective Each Additional Vessel  04/22/2016  CLINICAL DATA:   Refractory upper GI bleed from bleeding duodenal angiodysplasia with two prior endoscopic laser treatments. The patient continues to have bleeding requiring transfusion and now presents for arteriography with possible embolization to treat persistent hemorrhage. EXAM: UTERINE FIBROID EMBOLIZATION ANESTHESIA/SEDATION: 1.5 Mg IV Versed; 75 mcg IV Fentanyl. Total Moderate Sedation Time: 2 hours and 20 minutes. The patient's level of consciousness and physiologic status were continuously monitored during the procedure by Radiology nursing. CONTRAST:  150 mL Isovue-300 FLUOROSCOPY TIME:  58 minutes and 18 seconds. PROCEDURE: The procedure, risks, benefits, and alternatives were explained to the patient. Questions regarding the procedure were encouraged and answered. The patient understands and consents to the procedure. A time-out was performed prior to initiating the procedure. The right groin was prepped with chlorhexidine in a sterile fashion, and a sterile drape was applied covering the operative field. A sterile gown and sterile gloves were used for the procedure. Local anesthesia was provided with 1% Lidocaine. Ultrasound was used to confirm patency of the right common femoral artery. After small skin incision, a 21 gauge needle was advanced into the right common femoral artery under direct ultrasound guidance. Ultrasound image documentation was performed. After establishing guide wire access, a 5-French sheath was placed. A 5 Fr  Cobra catheter was advanced over a guidewire into the distal abdominal aorta. The catheter was then used to selectively catheterize the superior mesenteric artery. Selective arteriography was performed. The Cobra catheter was then advanced into the celiac axis and selective arteriography performed. The Cobra catheter was then reintroduced into the superior mesenteric artery and advanced into the proximal SMA trunk. Additional arteriography was performed. A Renegade STC micro catheter was  then advanced through the 5 French catheter and into a third order branch off of the superior mesenteric artery at the level of pancreaticoduodenal supply. Selective arteriography was performed. Transcatheter embolization was then performed through the micro catheter with introduction of 2 mm x 4 cm, 2 mm x 4 cm and 3 mm x 6 cm Interlock soft coils. Additional arteriography was performed through the micro catheter after embolization of this branch artery. Attempt was made to catheterize an additional third order pancreaticoduodenal branch off of the SMA with the same catheter system. Ultimately, a 71 [place] Sos catheter was used to catheterize the SMA trunk followed by advancement of a Lantern micro catheter. After selective catheterization of a third order pancreaticoduodenal branch, selective arteriography was performed. Transcatheter embolization was then performed of this branch via the micro catheter with introduction of 2 mm x 4 cm, 2 mm x 2 cm, 2 mm x 4 cm and 3 mm x 5 cm Ruby soft coils. Additional arteriography was performed through the micro catheter after embolization. The micro catheter was further retracted and additional arteriography performed at the level of a second order trunk off of the SMA. Catheters were removed. Oblique arteriography centered over the femoral head was performed through the right femoral sheath prior to use of a closure device. Right femoral arteriotomy hemostasis: Cordis Exoseal COMPLICATIONS: None. FINDINGS: Initial first order SMA arteriography demonstrates prominent pancreaticoduodenal vessels off of the first second order trunk of the SMA which extends to the regions of the second, third and fourth portions of the duodenum. There are two endo clips in close proximity to each other at the level of the third portion of the duodenum. Focal areas of hypervascular blush are noted in the second and third portions of the duodenum which correlate in location to the described areas  of angiodysplasia by prior endoscopy. Other SMA supply is normal in appearance without evidence of additional hypervascular abnormalities or AV malformations. Supply to the rest of the small bowel and proximal colon appears normal. Additional selective celiac arteriography demonstrates normal anatomy with normally patent celiac trunk, a widely patent left gastric artery and normally patent common hepatic and splenic arteries. The gastroduodenal artery is the first major branch off of the common hepatic artery and supplies branches to the region of the duodenal bulb and distal stomach. No branches are seen extending to the region of the endo clips and no hypervascular areas in the duodenum are identified with celiac injection. For this reason, embolization was targeted to the pancreaticoduodenal arcade off of the superior mesenteric artery. The pancreaticoduodenal trunk off of the SMA demonstrates a superior branch as well as middle and inferior branches. Based on arteriography, hypervascular areas of abnormality were predominantly supplied by the superior and inferior branches. The inferior branch was initially catheterized with selective arteriography demonstrating subtle hypervascular abnormality near the 2 adjacent endoscopic clips. This vessel was successfully embolized resulting in occlusion of antegrade flow with proximal injection. The superior pancreaticoduodenal branch supplying the region of the second and third portions of the duodenum was extremely difficult to catheterize due to  tortuous course. Ultimately, different catheter systems did allow selective catheterization and coil embolization resulting in cessation of antegrade flow in this branch. Completion arteriography demonstrates markedly reduced vascularity of the duodenum with persistent patent supply via the middle pancreaticoduodenal branch which supplies the region of the third portion of the duodenum. Additional branches supplying the region  of the fourth portion of the duodenum also remained normally patent on completion. Adequate hemostasis was achieved at the femoral arteriotomy site. IMPRESSION: The region of abnormal duodenal bleeding due to areas of angiodysplasia visualized and treated by endoscopy and also marked by endoscopic clips were supplied by branches of the pancreaticoduodenal arcade off of the proximal SMA. The gastroduodenal artery did not appear to supply this region by arteriography. Two separate pancreaticoduodenal third order arterial branches were successfully occluded with embolization coils placed via micro catheters. This resulted in decreased vascularity to the second and third portions of the duodenum. Electronically Signed   By: Irish Lack M.D.   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 The patient continues to have bleeding requiring transfusion and now presents for arteriography with possible embolization to treat persistent hemorrhage. EXAM: UTERINE FIBROID EMBOLIZATION ANESTHESIA/SEDATION: 1.5 Mg IV Versed; 75 mcg IV Fentanyl. Total Moderate Sedation Time: 2 hours and 20 minutes. The patient's level of consciousness and physiologic status were continuously monitored during the procedure by Radiology nursing. CONTRAST:  150 mL Isovue-300 FLUOROSCOPY TIME:  58 minutes and 18 seconds. PROCEDURE: The procedure, risks, benefits, and alternatives were explained to the patient. Questions regarding the procedure were encouraged and answered. The patient understands and consents to the procedure. A time-out was performed prior to initiating the procedure. The right groin was prepped with chlorhexidine in a sterile fashion, and a sterile drape was applied covering the operative field. A sterile gown and sterile gloves were used for the procedure. Local  anesthesia was provided with 1% Lidocaine. Ultrasound was used to confirm patency of the right common femoral artery. After small skin incision, a 21 gauge needle was advanced into the right common femoral artery under direct ultrasound guidance. Ultrasound image documentation was performed. After establishing guide wire access, a 5-French sheath was placed. A 5 Fr Cobra catheter was advanced over a guidewire into the distal abdominal aorta. The catheter was then used to selectively catheterize the superior mesenteric artery. Selective arteriography was performed. The Cobra catheter was then advanced into the celiac axis and selective arteriography performed. The Cobra catheter was then reintroduced into the superior mesenteric artery and advanced into the proximal SMA trunk. Additional arteriography was performed. A Renegade STC micro catheter was then advanced through the 5 French catheter and into a third order branch off of the superior mesenteric artery at the level of pancreaticoduodenal supply. Selective arteriography was performed. Transcatheter embolization was then performed through the micro catheter with introduction of 2 mm x 4 cm, 2 mm x 4 cm and 3 mm x 6 cm Interlock soft coils. Additional arteriography was performed through the micro catheter after embolization of this branch artery. Attempt was made to catheterize an additional third order pancreaticoduodenal branch off of the SMA with the same catheter system. Ultimately, a 5 [place] Sos catheter was used to catheterize the SMA trunk followed by advancement of a Lantern micro catheter. After selective catheterization of a third order pancreaticoduodenal branch, selective arteriography was performed. Transcatheter embolization was  then performed of this branch via the micro catheter with introduction of 2 mm x 4 cm, 2 mm x 2 cm, 2 mm x 4 cm and 3 mm x 5 cm Ruby soft coils. Additional arteriography was performed through the micro catheter after  embolization. The micro catheter was further retracted and additional arteriography performed at the level of a second order trunk off of the SMA. Catheters were removed. Oblique arteriography centered over the femoral head was performed through the right femoral sheath prior to use of a closure device. Right femoral arteriotomy hemostasis: Cordis Exoseal COMPLICATIONS: None. FINDINGS: Initial first order SMA arteriography demonstrates prominent pancreaticoduodenal vessels off of the first second order trunk of the SMA which extends to the regions of the second, third and fourth portions of the duodenum. There are two endo clips in close proximity to each other at the level of the third portion of the duodenum. Focal areas of hypervascular blush are noted in the second and third portions of the duodenum which correlate in location to the described areas of angiodysplasia by prior endoscopy. Other SMA supply is normal in appearance without evidence of additional hypervascular abnormalities or AV malformations. Supply to the rest of the small bowel and proximal colon appears normal. Additional selective celiac arteriography demonstrates normal anatomy with normally patent celiac trunk, a widely patent left gastric artery and normally patent common hepatic and splenic arteries. The gastroduodenal artery is the first major branch off of the common hepatic artery and supplies branches to the region of the duodenal bulb and distal stomach. No branches are seen extending to the region of the endo clips and no hypervascular areas in the duodenum are identified with celiac injection. For this reason, embolization was targeted to the pancreaticoduodenal arcade off of the superior mesenteric artery. The pancreaticoduodenal trunk off of the SMA demonstrates a superior branch as well as middle and inferior branches. Based on arteriography, hypervascular areas of abnormality were predominantly supplied by the superior and  inferior branches. The inferior branch was initially catheterized with selective arteriography demonstrating subtle hypervascular abnormality near the 2 adjacent endoscopic clips. This vessel was successfully embolized resulting in occlusion of antegrade flow with proximal injection. The superior pancreaticoduodenal branch supplying the region of the second and third portions of the duodenum was extremely difficult to catheterize due to tortuous course. Ultimately, different catheter systems did allow selective catheterization and coil embolization resulting in cessation of antegrade flow in this branch. Completion arteriography demonstrates markedly reduced vascularity of the duodenum with persistent patent supply via the middle pancreaticoduodenal branch which supplies the region of the third portion of the duodenum. Additional branches supplying the region of the fourth portion of the duodenum also remained normally patent on completion. Adequate hemostasis was achieved at the femoral arteriotomy site. IMPRESSION: The region of abnormal duodenal bleeding due to areas of angiodysplasia visualized and treated by endoscopy and also marked by endoscopic clips were supplied by branches of the pancreaticoduodenal arcade off of the proximal SMA. The gastroduodenal artery did not appear to supply this region by arteriography. Two separate pancreaticoduodenal third order arterial branches were successfully occluded with embolization coils placed via micro catheters. This resulted in decreased vascularity to the second and third portions of the duodenum. Electronically Signed   By: Aletta Edouard M.D.   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 The patient continues  to have bleeding requiring transfusion and now presents for arteriography with possible embolization to treat  persistent hemorrhage. EXAM: UTERINE FIBROID EMBOLIZATION ANESTHESIA/SEDATION: 1.5 Mg IV Versed; 75 mcg IV Fentanyl. Total Moderate Sedation Time: 2 hours and 20 minutes. The patient's level of consciousness and physiologic status were continuously monitored during the procedure by Radiology nursing. CONTRAST:  150 mL Isovue-300 FLUOROSCOPY TIME:  58 minutes and 18 seconds. PROCEDURE: The procedure, risks, benefits, and alternatives were explained to the patient. Questions regarding the procedure were encouraged and answered. The patient understands and consents to the procedure. A time-out was performed prior to initiating the procedure. The right groin was prepped with chlorhexidine in a sterile fashion, and a sterile drape was applied covering the operative field. A sterile gown and sterile gloves were used for the procedure. Local anesthesia was provided with 1% Lidocaine. Ultrasound was used to confirm patency of the right common femoral artery. After small skin incision, a 21 gauge needle was advanced into the right common femoral artery under direct ultrasound guidance. Ultrasound image documentation was performed. After establishing guide wire access, a 5-French sheath was placed. A 5 Fr Cobra catheter was advanced over a guidewire into the distal abdominal aorta. The catheter was then used to selectively catheterize the superior mesenteric artery. Selective arteriography was performed. The Cobra catheter was then advanced into the celiac axis and selective arteriography performed. The Cobra catheter was then reintroduced into the superior mesenteric artery and advanced into the proximal SMA trunk. Additional arteriography was performed. A Renegade STC micro catheter was then advanced through the 5 French catheter and into a third order branch off of the superior mesenteric artery at the level of pancreaticoduodenal supply. Selective arteriography was performed. Transcatheter embolization was then performed  through the micro catheter with introduction of 2 mm x 4 cm, 2 mm x 4 cm and 3 mm x 6 cm Interlock soft coils. Additional arteriography was performed through the micro catheter after embolization of this branch artery. Attempt was made to catheterize an additional third order pancreaticoduodenal branch off of the SMA with the same catheter system. Ultimately, a 89 [place] Sos catheter was used to catheterize the SMA trunk followed by advancement of a Lantern micro catheter. After selective catheterization of a third order pancreaticoduodenal branch, selective arteriography was performed. Transcatheter embolization was then performed of this branch via the micro catheter with introduction of 2 mm x 4 cm, 2 mm x 2 cm, 2 mm x 4 cm and 3 mm x 5 cm Ruby soft coils. Additional arteriography was performed through the micro catheter after embolization. The micro catheter was further retracted and additional arteriography performed at the level of a second order trunk off of the SMA. Catheters were removed. Oblique arteriography centered over the femoral head was performed through the right femoral sheath prior to use of a closure device. Right femoral arteriotomy hemostasis: Cordis Exoseal COMPLICATIONS: None. FINDINGS: Initial first order SMA arteriography demonstrates prominent pancreaticoduodenal vessels off of the first second order trunk of the SMA which extends to the regions of the second, third and fourth portions of the duodenum. There are two endo clips in close proximity to each other at the level of the third portion of the duodenum. Focal areas of hypervascular blush are noted in the second and third portions of the duodenum which correlate in location to the described areas of angiodysplasia by prior endoscopy. Other SMA supply is normal in appearance without evidence of additional hypervascular abnormalities or AV malformations. Supply  to the rest of the small bowel and proximal colon appears normal. Additional  selective celiac arteriography demonstrates normal anatomy with normally patent celiac trunk, a widely patent left gastric artery and normally patent common hepatic and splenic arteries. The gastroduodenal artery is the first major branch off of the common hepatic artery and supplies branches to the region of the duodenal bulb and distal stomach. No branches are seen extending to the region of the endo clips and no hypervascular areas in the duodenum are identified with celiac injection. For this reason, embolization was targeted to the pancreaticoduodenal arcade off of the superior mesenteric artery. The pancreaticoduodenal trunk off of the SMA demonstrates a superior branch as well as middle and inferior branches. Based on arteriography, hypervascular areas of abnormality were predominantly supplied by the superior and inferior branches. The inferior branch was initially catheterized with selective arteriography demonstrating subtle hypervascular abnormality near the 2 adjacent endoscopic clips. This vessel was successfully embolized resulting in occlusion of antegrade flow with proximal injection. The superior pancreaticoduodenal branch supplying the region of the second and third portions of the duodenum was extremely difficult to catheterize due to tortuous course. Ultimately, different catheter systems did allow selective catheterization and coil embolization resulting in cessation of antegrade flow in this branch. Completion arteriography demonstrates markedly reduced vascularity of the duodenum with persistent patent supply via the middle pancreaticoduodenal branch which supplies the region of the third portion of the duodenum. Additional branches supplying the region of the fourth portion of the duodenum also remained normally patent on completion. Adequate hemostasis was achieved at the femoral arteriotomy site. IMPRESSION: The region of abnormal duodenal bleeding due to areas of angiodysplasia visualized  and treated by endoscopy and also marked by endoscopic clips were supplied by branches of the pancreaticoduodenal arcade off of the proximal SMA. The gastroduodenal artery did not appear to supply this region by arteriography. Two separate pancreaticoduodenal third order arterial branches were successfully occluded with embolization coils placed via micro catheters. This resulted in decreased vascularity to the second and third portions of the duodenum. Electronically Signed   By: Aletta Edouard M.D.   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 The patient continues to have bleeding requiring transfusion and now presents for arteriography with possible embolization to treat persistent hemorrhage. EXAM: UTERINE FIBROID EMBOLIZATION ANESTHESIA/SEDATION: 1.5 Mg IV Versed; 75 mcg IV Fentanyl. Total Moderate Sedation Time: 2 hours and 20 minutes. The patient's level of consciousness and physiologic status were continuously monitored during the procedure by Radiology nursing. CONTRAST:  150 mL Isovue-300 FLUOROSCOPY TIME:  58 minutes and 18 seconds. PROCEDURE: The procedure, risks, benefits, and alternatives were explained to the patient. Questions regarding the procedure were encouraged and answered. The patient understands and consents to the procedure. A time-out was performed prior to initiating the procedure. The right groin was prepped with chlorhexidine in a sterile fashion, and a sterile drape was applied covering the operative field. A sterile gown and sterile gloves were used for the procedure. Local anesthesia was provided with 1% Lidocaine. Ultrasound was used to confirm patency of the right common femoral artery. After small skin incision, a 21 gauge needle was advanced into the right common femoral artery under direct ultrasound guidance. Ultrasound image  documentation was performed. After establishing guide wire access, a 5-French sheath was placed. A 5 Fr Cobra catheter was advanced over  a guidewire into the distal abdominal aorta. The catheter was then used to selectively catheterize the superior mesenteric artery. Selective arteriography was performed. The Cobra catheter was then advanced into the celiac axis and selective arteriography performed. The Cobra catheter was then reintroduced into the superior mesenteric artery and advanced into the proximal SMA trunk. Additional arteriography was performed. A Renegade STC micro catheter was then advanced through the 5 French catheter and into a third order branch off of the superior mesenteric artery at the level of pancreaticoduodenal supply. Selective arteriography was performed. Transcatheter embolization was then performed through the micro catheter with introduction of 2 mm x 4 cm, 2 mm x 4 cm and 3 mm x 6 cm Interlock soft coils. Additional arteriography was performed through the micro catheter after embolization of this branch artery. Attempt was made to catheterize an additional third order pancreaticoduodenal branch off of the SMA with the same catheter system. Ultimately, a 35 [place] Sos catheter was used to catheterize the SMA trunk followed by advancement of a Lantern micro catheter. After selective catheterization of a third order pancreaticoduodenal branch, selective arteriography was performed. Transcatheter embolization was then performed of this branch via the micro catheter with introduction of 2 mm x 4 cm, 2 mm x 2 cm, 2 mm x 4 cm and 3 mm x 5 cm Ruby soft coils. Additional arteriography was performed through the micro catheter after embolization. The micro catheter was further retracted and additional arteriography performed at the level of a second order trunk off of the SMA. Catheters were removed. Oblique arteriography centered over the femoral head was performed through the right femoral sheath  prior to use of a closure device. Right femoral arteriotomy hemostasis: Cordis Exoseal COMPLICATIONS: None. FINDINGS: Initial first order SMA arteriography demonstrates prominent pancreaticoduodenal vessels off of the first second order trunk of the SMA which extends to the regions of the second, third and fourth portions of the duodenum. There are two endo clips in close proximity to each other at the level of the third portion of the duodenum. Focal areas of hypervascular blush are noted in the second and third portions of the duodenum which correlate in location to the described areas of angiodysplasia by prior endoscopy. Other SMA supply is normal in appearance without evidence of additional hypervascular abnormalities or AV malformations. Supply to the rest of the small bowel and proximal colon appears normal. Additional selective celiac arteriography demonstrates normal anatomy with normally patent celiac trunk, a widely patent left gastric artery and normally patent common hepatic and splenic arteries. The gastroduodenal artery is the first major branch off of the common hepatic artery and supplies branches to the region of the duodenal bulb and distal stomach. No branches are seen extending to the region of the endo clips and no hypervascular areas in the duodenum are identified with celiac injection. For this reason, embolization was targeted to the pancreaticoduodenal arcade off of the superior mesenteric artery. The pancreaticoduodenal trunk off of the SMA demonstrates a superior branch as well as middle and inferior branches. Based on arteriography, hypervascular areas of abnormality were predominantly supplied by the superior and inferior branches. The inferior branch was initially catheterized with selective arteriography demonstrating subtle hypervascular abnormality near the 2 adjacent endoscopic clips. This vessel was successfully embolized resulting in occlusion of antegrade flow with proximal  injection. The superior pancreaticoduodenal branch supplying the region of the second and third portions of the duodenum was extremely difficult to catheterize due to tortuous course. Ultimately, different catheter  systems did allow selective catheterization and coil embolization resulting in cessation of antegrade flow in this branch. Completion arteriography demonstrates markedly reduced vascularity of the duodenum with persistent patent supply via the middle pancreaticoduodenal branch which supplies the region of the third portion of the duodenum. Additional branches supplying the region of the fourth portion of the duodenum also remained normally patent on completion. Adequate hemostasis was achieved at the femoral arteriotomy site. IMPRESSION: The region of abnormal duodenal bleeding due to areas of angiodysplasia visualized and treated by endoscopy and also marked by endoscopic clips were supplied by branches of the pancreaticoduodenal arcade off of the proximal SMA. The gastroduodenal artery did not appear to supply this region by arteriography. Two separate pancreaticoduodenal third order arterial branches were successfully occluded with embolization coils placed via micro catheters. This resulted in decreased vascularity to the second and third portions of the duodenum. Electronically Signed   By: Aletta Edouard M.D.   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 The patient continues to have bleeding requiring transfusion and now presents for arteriography with possible embolization to treat persistent hemorrhage. EXAM: UTERINE FIBROID EMBOLIZATION ANESTHESIA/SEDATION: 1.5 Mg IV Versed; 75 mcg IV Fentanyl. Total Moderate Sedation Time: 2 hours and 20 minutes. The patient's level of consciousness and physiologic status were  continuously monitored during the procedure by Radiology nursing. CONTRAST:  150 mL Isovue-300 FLUOROSCOPY TIME:  58 minutes and 18 seconds. PROCEDURE: The procedure, risks, benefits, and alternatives were explained to the patient. Questions regarding the procedure were encouraged and answered. The patient understands and consents to the procedure. A time-out was performed prior to initiating the procedure. The right groin was prepped with chlorhexidine in a sterile fashion, and a sterile drape was applied covering the operative field. A sterile gown and sterile gloves were used for the procedure. Local anesthesia was provided with 1% Lidocaine. Ultrasound was used to confirm patency of the right common femoral artery. After small skin incision, a 21 gauge needle was advanced into the right common femoral artery under direct ultrasound guidance. Ultrasound image documentation was performed. After establishing guide wire access, a 5-French sheath was placed. A 5 Fr Cobra catheter was advanced over a guidewire into the distal abdominal aorta. The catheter was then used to selectively catheterize the superior mesenteric artery. Selective arteriography was performed. The Cobra catheter was then advanced into the celiac axis and selective arteriography performed. The Cobra catheter was then reintroduced into the superior mesenteric artery and advanced into the proximal SMA trunk. Additional arteriography was performed. A Renegade STC micro catheter was then advanced through the 5 French catheter and into a third order branch off of the superior mesenteric artery at the level of pancreaticoduodenal supply. Selective arteriography was performed. Transcatheter embolization was then performed through the micro catheter with introduction of 2 mm x 4 cm, 2 mm x 4 cm and 3 mm x 6 cm Interlock soft coils. Additional arteriography was performed through the micro catheter after embolization of this branch artery. Attempt was made  to catheterize an additional third order pancreaticoduodenal branch off of the SMA with the same catheter system. Ultimately, a 59 [place] Sos catheter was used to catheterize the SMA trunk followed by advancement of a Lantern micro catheter. After selective catheterization of a third order pancreaticoduodenal branch, selective arteriography was performed. Transcatheter embolization  was then performed of this branch via the micro catheter with introduction of 2 mm x 4 cm, 2 mm x 2 cm, 2 mm x 4 cm and 3 mm x 5 cm Ruby soft coils. Additional arteriography was performed through the micro catheter after embolization. The micro catheter was further retracted and additional arteriography performed at the level of a second order trunk off of the SMA. Catheters were removed. Oblique arteriography centered over the femoral head was performed through the right femoral sheath prior to use of a closure device. Right femoral arteriotomy hemostasis: Cordis Exoseal COMPLICATIONS: None. FINDINGS: Initial first order SMA arteriography demonstrates prominent pancreaticoduodenal vessels off of the first second order trunk of the SMA which extends to the regions of the second, third and fourth portions of the duodenum. There are two endo clips in close proximity to each other at the level of the third portion of the duodenum. Focal areas of hypervascular blush are noted in the second and third portions of the duodenum which correlate in location to the described areas of angiodysplasia by prior endoscopy. Other SMA supply is normal in appearance without evidence of additional hypervascular abnormalities or AV malformations. Supply to the rest of the small bowel and proximal colon appears normal. Additional selective celiac arteriography demonstrates normal anatomy with normally patent celiac trunk, a widely patent left gastric artery and normally patent common hepatic and splenic arteries. The gastroduodenal artery is the first major  branch off of the common hepatic artery and supplies branches to the region of the duodenal bulb and distal stomach. No branches are seen extending to the region of the endo clips and no hypervascular areas in the duodenum are identified with celiac injection. For this reason, embolization was targeted to the pancreaticoduodenal arcade off of the superior mesenteric artery. The pancreaticoduodenal trunk off of the SMA demonstrates a superior branch as well as middle and inferior branches. Based on arteriography, hypervascular areas of abnormality were predominantly supplied by the superior and inferior branches. The inferior branch was initially catheterized with selective arteriography demonstrating subtle hypervascular abnormality near the 2 adjacent endoscopic clips. This vessel was successfully embolized resulting in occlusion of antegrade flow with proximal injection. The superior pancreaticoduodenal branch supplying the region of the second and third portions of the duodenum was extremely difficult to catheterize due to tortuous course. Ultimately, different catheter systems did allow selective catheterization and coil embolization resulting in cessation of antegrade flow in this branch. Completion arteriography demonstrates markedly reduced vascularity of the duodenum with persistent patent supply via the middle pancreaticoduodenal branch which supplies the region of the third portion of the duodenum. Additional branches supplying the region of the fourth portion of the duodenum also remained normally patent on completion. Adequate hemostasis was achieved at the femoral arteriotomy site. IMPRESSION: The region of abnormal duodenal bleeding due to areas of angiodysplasia visualized and treated by endoscopy and also marked by endoscopic clips were supplied by branches of the pancreaticoduodenal arcade off of the proximal SMA. The gastroduodenal artery did not appear to supply this region by arteriography. Two  separate pancreaticoduodenal third order arterial branches were successfully occluded with embolization coils placed via micro catheters. This resulted in decreased vascularity to the second and third portions of the duodenum. Electronically Signed   By: Aletta Edouard M.D.   On: 04/22/2016 14:57   Mckenzee Beem Cletis Media, MD 04/23/2016, 9:12 AM PGY-2, [place] Intern pager: [phone], text pages welcome

## 2016-04-24 ENCOUNTER — Encounter (HOSPITAL_COMMUNITY): Payer: Self-pay | Admitting: General Surgery

## 2016-04-24 LAB — CBC
HEMATOCRIT: 23.4 % — AB (ref 36.0–46.0)
HEMOGLOBIN: 7.4 g/dL — AB (ref 12.0–15.0)
MCH: 28 pg (ref 26.0–34.0)
MCHC: 31.6 g/dL (ref 30.0–36.0)
MCV: 88.6 fL (ref 78.0–100.0)
Platelets: 151 10*3/uL (ref 150–400)
RBC: 2.64 MIL/uL — AB (ref 3.87–5.11)
RDW: 18.4 % — ABNORMAL HIGH (ref 11.5–15.5)
WBC: 6.9 10*3/uL (ref 4.0–10.5)

## 2016-04-24 LAB — RETICULOCYTES
RBC.: 2.83 MIL/uL — AB (ref 3.87–5.11)
RETIC COUNT ABSOLUTE: 181.1 10*3/uL (ref 19.0–186.0)
RETIC CT PCT: 6.4 % — AB (ref 0.4–3.1)

## 2016-04-24 LAB — HEMOGLOBIN AND HEMATOCRIT, BLOOD
HEMATOCRIT: 24.8 % — AB (ref 36.0–46.0)
HEMOGLOBIN: 7.9 g/dL — AB (ref 12.0–15.0)

## 2016-04-24 MED ORDER — SUCRALFATE 1 GM/10ML PO SUSP
1.0000 g | Freq: Three times a day (TID) | ORAL | Status: DC
  Administered 2016-04-24 – 2016-04-29 (×21): 1 g via ORAL
  Filled 2016-04-24 (×21): qty 10

## 2016-04-24 NOTE — Progress Notes (Signed)
Family Medicine Teaching Service Daily Progress Note Intern Pager: [phone]  Patient name: Nicole Perez Medical record number: [id number] Date of birth: 1933/12/13 Age: 82 y.o. Gender: female  Primary Care Provider: MCDIARMID,TODD D, MD Consultants: GI, Cardiology, and IR  Code Status: DNR  Pt Overview and Major Events to Date:  5/2 admitted with symptomatic anemia in the setting of GI bleed and received 2 units 5/3 push enterescopy 5/4 transfused 1 unit pRBCs 5/5 tagged RBC scan 5/6 transfused 1 unit pRBCs 5/7 transfused 1 units pRBCs  Assessment and Plan: Nicole Perez is a 82 y.o. female presenting with symptomatic anemia. PMH is significant for recent hospitalization for anemia/ GI bleed/ Angiodysplasia, hypertension, neurogenic bladder and OA  # Symptomatic anemia 2/2 GI bleed: Hgb 5.5>4u>7.2>1 u >8.3>7.4   Push enteroscopy on 5/3 revealed one non-bleeding duodenal ulcer treated with argon plasma coagulation and clip placement. Bleeding scan on 5/5 showed a bleed that is favored to be from the proximal small bowel, potentially secondary to patient's known duodenal AVM. S/p embolization of pancreaticoduodenal arteries on 5/7. Still with melanotic stool. No bright red blood. - Appreciate IR and GI recs - Hgb dropped again to 7.4. Will discuss with IR again - CBC in PM - Continue PO Protonix 40 twice a day  - Telemetry - VS per floor protocol  # Epigastric Abdominal Pain: improved. TTP over epigastric areas. - Continue PPI as above  # Chest pain: None. EKG with ST depression on admission likely demand ischemia in the setting of anemia. Troponin negative x2  - Appreciate card recs, Nuclear scan as outpt - will avoid ASA in the setting of current GI bleed  # HTN: Holding for now due to blood pressures. lisinopril and HCTZ at home - Holding home meds - Monitor BP  Bradycardia: HR in 40's this morning.  intermittently bradycardic. Has history of junctional rhythm. On 30 day  monitor before admission.  # Neurogenic bladder: performs self caths at home. No dysuria but has suprapubic TTP on exam.UA with many bacteria, small LE and positive nitrite. Urine culture with E. Coli sensitive to Cefazolin - I &O cath prn - s/p CTX 5/2>5/4 - s/p Keflex 5/4>5/8  # Osteoarthritis: Foot and leg pain. History of back & LE OA. On Lorcet at home - Home Norco 10 BID prn - Avoid NSAIDs in the setting of GI bleed - Bowel regimen (miralax and MOM) - Aspercreme twice a day as needed - Consider gabapentin if no improvement  FEN/GI:  Diet soft foods, possibly advance today   Prophylaxis: SCDs in the setting of GI bleeding  Disposition: SDU   Subjective:  Patient had leg pain overnight. Epigastric pain has improved. Reports having black stool. Denies bright red blood in it. Denies chest pain or shortness of breath .  Objective: Temp:  [98.2 F (36.8 C)-98.4 F (36.9 C)] 98.2 F (36.8 C) (05/09 0452) Pulse Rate:  [45-77] 62 (05/08 1624) Resp:  [15-21] 18 (05/09 0452) BP: (95-126)/(40-65) 126/53 mmHg (05/09 0452) SpO2:  [95 %-100 %] 100 % (05/09 0452) Physical Exam: GEN: lying in bed, appears well, NAD CVS: bradycardia to 56,  normal s1 and s2, no murmurs, no edema RESP: normal work of breathing, no crackles or wheeze GI: normal bowel sound, soft, + tender to palpation over epigastric area, non-distended NEURO: alert and oriented x4, no gross defecits   Laboratory:  Recent Labs Lab 04/23/16 0540 04/23/16 1623 04/24/16 0224  WBC 7.7 8.9 6.9  HGB 8.3* 8.8* 7.4*  HCT 25.6* 27.3* 23.4*  PLT 144* 164 151    Recent Labs Lab 04/21/16 1007 04/22/16 0340 04/23/16 0540  NA 139 139 137  K 4.8 4.4 4.5  CL 108 107 105  CO2 24 21* 24  BUN 28* 26* 24*  CREATININE 0.90 0.96 0.75  CALCIUM 8.1* 8.0* 7.8*  PROT  --  4.6*  --   BILITOT  --  0.5  --   ALKPHOS  --  36*  --   ALT  --  12*  --   AST  --  16  --   GLUCOSE 94 115* 91     Mercy Riding, MD 04/24/2016,  7:27 AM PGY-2, [place] Intern pager: [phone], text pages welcome

## 2016-04-24 NOTE — Consult Note (Signed)
Reason for Consult: upper GI bleeding Referring Physician: Dr. Chrisandra Netters    HPI: Nicole Perez is a 82 year old female with a history of HTN, neurogenic bladder, OA, abdominal hysterectomy who was admitted 04/09/16-04/12/16 for GI bleeding and symptomatic blood loss anemia. At this time, hemoglobin was 6.7 and shew as managed with blood products and also underwent an EGD on 4/25 which revealed four non bleeding angiodysplastic lesions in the duodenum treated with APC(second portion of the duodenum), 2 non bleeding angiodysplastic lesions in the duodenum treated with APC(third portion of the duodenum).  She was readmitted on 5/1 with anemia, hemoglobin was 5.7 and epigastric abdominal pain.  Gastroenterology was again consulted and she underwent an enteroscopy on 5/3 which showed one non bleeding duodenal ulcer with a visible vessel possible at prior site of APC.  Clips were placed.   The jejunum was normal.   Continued to have drops in hemoglobin and had a positive bleeding scan and subsequently had embolization on 5/7 to pancreaticoduodenal branches by Dr. Kathlene Cote. On 5/8 hgb was 8.3.  On 5/9 hgb dropped down to 7.4. We have therefore been asked to evaluate.   The patient denies further shortness of breath or chest pains.  Last bowel movement was last night which was large and dark tarry. She is on a soft diet and tolerating well.  On PPI BID.   Past Medical History  Diagnosis Date  . Essential hypertension, benign 04/18/2010  . Lichen sclerosus et atrophicus of the vulva   . BACK PAIN, CHRONIC 04/18/2010    degenerative spine disease, spinal stenosis throughout spine  . DEGENERATIVE JOINT DISEASE, HIPS 09/11/2007    Multilevel degenerative spine dz and spinal stenosis  . INSOMNIA, CHRONIC 04/18/2010  . Parotid adenoma 1990, 2012    Right parotid, recurrent parotid pleimorphic adenoma.   . Osteoarthritis, multiple sites 08/11/2013  . Cystocele 08/11/2013  . Acute renal failure ([place]) 02/23/2014   . History of pneumonia 07/26/2011  . Rectal fissure 12/16/2013  . UTI (urinary tract infection) 02/22/2014  . 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 Hearing loss sensory, bilateral 07/26/2011    Right >> Left.  Left ear hearing aid b/c work discrimination in       R. ear is very poor. Audiologist-Stephanie Nance at [facility] in [place].  (05/10/2010)   . HYPERLIPIDEMIA 05/10/2010    Qualifier: Diagnosis of  By: McDiarmid MD, Sherren Mocha    . Incontinence overflow, urine 04/28/2014  . Incomplete emptying of bladder 02/09/2014  . Paresthesia of both feet 08/06/2014  . Orthostatic hypotension 08/06/2014  . Blepharitis 11/16/2014    Diagnosis by optometrist, Renaldo Harrison on exam 11/13/2014  . Dry eye syndrome 11/16/2014  . Glaucoma suspect 11/16/2014  . Blood in stool 12/20/2014  . Cholelithiasis   . Colon polyps 2006. 2016    adenomatous and hyperplaxtic  . H. pylori infection 2016    h pylori erosive gastritis, treated with PPI, antibiotics.   . Hiatal hernia 05/05/2015    Large Hiatal Hernia found on EGD by Dr Hilarie Fredrickson (GI in [place]) in work up of melena and (+) FOBT.  Marland Kitchen Urethral polyp 11/17/2015  . 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  ablated.   . Overflow incontinence 04/28/2014  . Complicated UTI (urinary tract infection) 01/30/2016    Past Surgical History  Procedure Laterality Date  . Total abdominal hysterectomy w/  bilateral salpingoophorectomy  1964    Hysterectomy and bilateral oopherectomy at age 25 for benign reasons  . Bladder suspension      Bladder tack x 2 (Dr Janice Norrie)  . Cystocele repair      Rectal prolapse and cyctocele adter hysterectomy requiring anterior repair Jerilynn Mages Edwinna Areola, MD)  . Rectocele repair      Rectal prolapse and cyctocele adter hysterectomy requiring anterior repair Jerilynn Mages Edwinna Areola, MD)  . Urethral dilation    . Parotid gland tumor excision  1990    S/P excision of Right  Parotid Gland Benign Tumor, 1990  . Reconstruction of nose  1994    Nasal bridge reconstruction (Dr Judie Grieve, 1994) for following  Forklift accident on job. Surgery complicated by nerve damage resulting in difficulty raising right eyebrow   . Carpal tunnel release  2010    Carpel Tunnel Release of  left wrist  03/2009 (Dr Fredna Dow): Nerve   . Cataract extraction w/ intraocular lens implant  2010    Dr Charise Killian (ophth)  . Breast lumpectomy      Lumpectomy of benign Breast lumps bilaterally, Dr Bubba Camp  . Laminectomy      S/P L4-5 Laminectomy (1987) for decompression of spinal stenosis  . Colonoscopy w/ polypectomy  2006  . Parotidectomy  08/30/11    Radene Journey, MD (ENT) for recurrent right parotid pleomorphic adenoma by frozen section  . Carpal tunnel release      right wrist  . Esophagogastroduodenoscopy N/A 04/10/2016    Procedure: ESOPHAGOGASTRODUODENOSCOPY (EGD);  Surgeon: Irene Shipper, MD;  Location: [facility] ENDOSCOPY;  Service: Endoscopy;  Laterality: N/A;  . Enteroscopy N/A 04/18/2016    Procedure: ENTEROSCOPY;  Surgeon: Mauri Pole, MD;  Location: [facility] ENDOSCOPY;  Service: Endoscopy;  Laterality: N/A;    Family History  Problem Relation Age of Onset  . Coronary artery disease Mother   . Coronary artery disease Sister     open heart surgery  . Diabetes type II Sister   . Stroke Father 61  . Hypertension Father   . Lupus Brother   . Heart disease Brother   . Heart attack Sister     Social History:  reports that she quit smoking about 32 years ago. She has quit using smokeless tobacco. She reports that she does not drink alcohol or use illicit drugs.  Allergies:  Allergies  Allergen Reactions  . Nitrofurantoin Other (See Comments)    Malaise and profound fatigue     Medications:  Scheduled Meds: . antiseptic oral rinse  7 mL Mouth Rinse BID  . erythromycin   Both Eyes Q6H  . pantoprazole  40 mg Oral BID AC  . sodium chloride flush  3 mL Intravenous Q12H  . sucralfate  1 g  Oral TID WC & HS   Continuous Infusions:  PRN Meds:.acetaminophen **OR** acetaminophen, alum & mag hydroxide-simeth, HYDROcodone-acetaminophen, magnesium hydroxide, MUSCLE RUB, ondansetron **OR** ondansetron (ZOFRAN) IV, phenol, polyethylene glycol   Results for orders placed or performed during the hospital encounter of 04/16/16 (from the past 48 hour(s))  Hemoglobin and hematocrit, blood     Status: Abnormal   Collection Time: 04/22/16  3:47 PM  Result Value Ref Range   Hemoglobin 7.2 (L) 12.0 - 15.0 g/dL   HCT 23.0 (L) 36.0 - 46.0 %  Prepare RBC     Status: None   Collection Time: 04/22/16  8:33 PM  Result Value Ref Range   Order Confirmation ORDER PROCESSED BY BLOOD BANK   CBC  Status: Abnormal   Collection Time: 04/23/16  5:40 AM  Result Value Ref Range   WBC 7.7 4.0 - 10.5 K/uL   RBC 2.98 (L) 3.87 - 5.11 MIL/uL   Hemoglobin 8.3 (L) 12.0 - 15.0 g/dL   HCT 25.6 (L) 36.0 - 46.0 %   MCV 85.9 78.0 - 100.0 fL   MCH 27.9 26.0 - 34.0 pg   MCHC 32.4 30.0 - 36.0 g/dL   RDW 18.3 (H) 11.5 - 15.5 %   Platelets 144 (L) 150 - 400 K/uL  Basic metabolic panel     Status: Abnormal   Collection Time: 04/23/16  5:40 AM  Result Value Ref Range   Sodium 137 135 - 145 mmol/L   Potassium 4.5 3.5 - 5.1 mmol/L   Chloride 105 101 - 111 mmol/L   CO2 24 22 - 32 mmol/L   Glucose, Bld 91 65 - 99 mg/dL   BUN 24 (H) 6 - 20 mg/dL   Creatinine, Ser 0.75 0.44 - 1.00 mg/dL   Calcium 7.8 (L) 8.9 - 10.3 mg/dL   GFR calc non Af Amer >60 >60 mL/min   GFR calc Af Amer >60 >60 mL/min    Comment: (NOTE) The eGFR has been calculated using the CKD EPI equation. This calculation has not been validated in all clinical situations. eGFR's persistently <60 mL/min signify possible Chronic Kidney Disease.    Anion gap 8 5 - 15  CBC     Status: Abnormal   Collection Time: 04/23/16  4:23 PM  Result Value Ref Range   WBC 8.9 4.0 - 10.5 K/uL   RBC 3.09 (L) 3.87 - 5.11 MIL/uL   Hemoglobin 8.8 (L) 12.0 - 15.0  g/dL   HCT 27.3 (L) 36.0 - 46.0 %   MCV 88.3 78.0 - 100.0 fL   MCH 28.5 26.0 - 34.0 pg   MCHC 32.2 30.0 - 36.0 g/dL   RDW 19.1 (H) 11.5 - 15.5 %   Platelets 164 150 - 400 K/uL  CBC     Status: Abnormal   Collection Time: 04/24/16  2:24 AM  Result Value Ref Range   WBC 6.9 4.0 - 10.5 K/uL   RBC 2.64 (L) 3.87 - 5.11 MIL/uL   Hemoglobin 7.4 (L) 12.0 - 15.0 g/dL   HCT 23.4 (L) 36.0 - 46.0 %   MCV 88.6 78.0 - 100.0 fL   MCH 28.0 26.0 - 34.0 pg   MCHC 31.6 30.0 - 36.0 g/dL   RDW 18.4 (H) 11.5 - 15.5 %   Platelets 151 150 - 400 K/uL    No results found.  Review of Systems  Constitutional: Negative for fever, chills, weight loss, malaise/fatigue and diaphoresis.  Respiratory: Negative for cough and hemoptysis.   Cardiovascular: Negative for chest pain, palpitations, orthopnea, claudication, leg swelling and PND.  Gastrointestinal: Negative for nausea, vomiting, abdominal pain, diarrhea, constipation, blood in stool and melena.  Neurological: Negative for dizziness, tingling, tremors, sensory change, speech change, focal weakness, seizures, loss of consciousness and weakness.  Psychiatric/Behavioral: Negative for depression, suicidal ideas, hallucinations, memory loss and substance abuse. The patient is not nervous/anxious and does not have insomnia.    Blood pressure 108/56, pulse 48, temperature 98.2 F (36.8 C), temperature source Oral, resp. rate 16, height _0  (1.499 m), weight 67.2 kg (148 lb 2.4 oz), last menstrual period 12/17/1962, SpO2 98 %. Physical Exam  Constitutional: She is oriented to person, place, and time. She appears well-developed and well-nourished. No distress.  Cardiovascular: Normal rate,  regular rhythm, normal heart sounds and intact distal pulses.  Exam reveals no gallop and no friction rub.   No murmur heard. Respiratory: Effort normal and breath sounds normal. No respiratory distress. She has no wheezes. She has no rales. She exhibits no tenderness.  GI:  Soft. Bowel sounds are normal. She exhibits no distension and no mass. There is no tenderness. There is no rebound and no guarding.  Musculoskeletal: Normal range of motion. She exhibits no edema or tenderness.  Neurological: She is alert and oriented to person, place, and time.  Skin: Skin is warm and dry. No rash noted. She is not diaphoretic. No erythema. No pallor.  Psychiatric: She has a normal mood and affect. Her behavior is normal. Judgment and thought content normal.    Assessment/Plan: Upper GI bleeding-localized to the 2nd and 3rd portion of a duodenum from AVMs.  GI has scoped the patient 2 times, IR embolization.  It is unclear whether the patient is continuing to bleed or hemoglobin is equilibrating.  She is hemodynamically stable at present time.  There are no good surgical options for the second and third portion of the duodenum, but will discuss with Dr. Rosendo Gros.  Would continue to trend h&h.  I'm not sure if there would be an additional role for repeat IR angioembolization if se bleeds, but would certainly attempt if felt appropriate.  Thank you for the consult.  Will continue to follow.     4/25 EGD which revealed four non bleeding angiodysplastic lesions in the duodenum treated with APC(second portion of the duodenum), 2 non bleeding angiodysplastic lesions in the duodenum treated with APC(third portion of the duodenum) 5/2 2 units of pRBCs 5/3 Enteroscopy which showed one non bleeding duodenal ulcer with a visible vessel possible at prior site of APC.  Clips were placed.   The jejunum was normal.   5/4 1u pRBCs(hgb 6.5) 5/5 NM bleeding scan- bleeding favored proximal small bowel to known duodenal AVMs.(hgb 7.4) 5/6 1u pRBCs(hgb 7) 5/7 1u pRBCs 5/7 angioemobilization to pancreaticoduodenal branches, GDA did not appear to supply the areas treated by endoscopy that were marked by the clips on angiography.   Vaneta Hammontree ANP-BC 04/24/2016, 12:13 PM

## 2016-04-24 NOTE — Progress Notes (Signed)
Referring Physician(s): Dr Harl Bowie  Supervising Physician: Daryll Brod  Patient Status: In-pt  Chief Complaint:  Interventional Radiology Procedure Note  Procedure: Celiac and SMA visceral arteriography; Embolization of pancreaticoduodenal arteries 04/22/16  Subjective:  Much better Less abd pain Up in bed Pleasant H/H stable Denies any bleeding   Allergies: Nitrofurantoin  Medications: Prior to Admission medications   Medication Sig Start Date End Date Taking? Authorizing Provider  esomeprazole (NEXIUM) 40 MG capsule Take 1 capsule (40 mg total) by mouth daily. 04/16/16  Yes Olam Idler, MD  ferrous sulfate 325 (65 FE) MG tablet Take 1 tablet (325 mg total) by mouth 2 (two) times daily with a meal. 04/11/16  Yes Mercy Riding, MD  HYDROcodone-acetaminophen (NORCO) 10-325 MG tablet Take 1 tablet by mouth every 12 (twelve) hours as needed for moderate pain or severe pain. Patient taking differently: Take 1 tablet by mouth every 8 (eight) hours as needed for moderate pain or severe pain.  04/16/16  Yes Lind Covert, MD  lidocaine (XYLOCAINE) 2 % jelly Apply 1 dime size amount of gel to the area between your vagina and rectum once daily for pain. You may use up on 2 times per day if needed 03/12/16  Yes Caren Macadam, MD  magnesium hydroxide (MILK OF MAGNESIA) 400 MG/5ML suspension Take 30 mLs by mouth daily as needed for mild constipation.   Yes Historical Provider, MD  polyethylene glycol powder (GLYCOLAX/MIRALAX) powder Take 17 g by mouth daily as needed for mild constipation, moderate constipation or severe constipation. 02/14/16  Yes Blane Ohara McDiarmid, MD     Vital Signs: BP 126/53 mmHg  Pulse 62  Temp(Src) 98.2 F (36.8 C) (Tympanic)  Resp 18  Ht 4\' 11"  (1.499 m)  Wt 148 lb 2.4 oz (67.2 kg)  BMI 29.91 kg/m2  SpO2 100%  LMP 12/17/1962  Physical Exam  Constitutional: She is oriented to person, place, and time.  Abdominal: Soft. Bowel  sounds are normal. There is no tenderness.  Musculoskeletal: Normal range of motion.  Neurological: She is alert and oriented to person, place, and time.  Skin: Skin is warm and dry.  Rt groin NT no bleeding No hematoma Rt foot 2+ pulses  Psychiatric: She has a normal mood and affect. Her behavior is normal. Judgment and thought content normal.  Nursing note and vitals reviewed.   Imaging: Nm Gi Blood Loss  04/20/2016  CLINICAL DATA:  Symptomatic anemia. GI bleed. History of angiodysplasia. 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 EXAM: NUCLEAR MEDICINE GASTROINTESTINAL BLEEDING SCAN TECHNIQUE: Sequential abdominal images were obtained following intravenous administration of Tc-34m labeled red blood cells. RADIOPHARMACEUTICALS:  Twenty-seven mCi Tc-1m in-vitro labeled red cells. COMPARISON:  CT abdomen pelvis - 12/18/2014; 12/27/2013 FINDINGS: There is a very small amount of suspected intraluminal activity seen within the suspected proximal small bowel on the initial 60 minutes cine images. No definitive abnormal activity is seen on the provided anterior projection planar images. No definitive abnormal activity is seen on the subsequent 65 - 120 minute cine or anterior projection planar images. Physiologic activity is seen within the heart, liver and major vascular structures of the abdomen and pelvis. IMPRESSION: Trace/faint amount of intraluminal activity seen only on the initial 60 minute cine images, not seen on the subsequent 65 to 120 minute images suggestive of a small, intermittent bleed. 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 Critical Value/emergent results were called by telephone at the time of interpretation on 04/20/2016 at 5:57 pm to Dr. Wendee Beavers , who verbally acknowledged these results. Electronically Signed   By: Sandi Mariscal M.D.   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 The patient continues to have bleeding requiring transfusion and now presents for arteriography with possible embolization to treat persistent hemorrhage. EXAM: UTERINE FIBROID EMBOLIZATION ANESTHESIA/SEDATION: 1.5 Mg IV Versed; 75 mcg IV Fentanyl. Total Moderate Sedation Time: 2 hours and 20 minutes. The patient's level of consciousness and physiologic status were continuously monitored during the procedure by Radiology nursing. CONTRAST:  150 mL Isovue-300 FLUOROSCOPY TIME:  58 minutes and 18 seconds. PROCEDURE: The procedure, risks, benefits, and alternatives were explained to the patient. Questions regarding the procedure were encouraged and answered. The patient understands and consents to the procedure. A time-out was performed prior to initiating the procedure. The right groin was prepped with chlorhexidine in a sterile fashion, and a sterile drape was applied covering the operative field. A sterile gown and sterile gloves were used for the procedure. Local anesthesia was provided with 1% Lidocaine. Ultrasound was used to confirm patency of the right common femoral artery. After small skin incision, a 21 gauge needle was advanced into the right common femoral artery under direct ultrasound guidance. Ultrasound image documentation was performed. After establishing guide wire access, a 5-French sheath was placed. A 5 Fr Cobra catheter was advanced over a guidewire into the distal abdominal aorta. The catheter was then used to selectively catheterize the superior mesenteric artery. Selective arteriography was performed. The Cobra catheter was then advanced into the celiac axis and selective arteriography performed. The Cobra catheter was then reintroduced into the superior mesenteric artery and advanced into the proximal SMA  trunk. Additional arteriography was performed. A Renegade STC micro catheter was then advanced through the 5 French catheter and into a third order branch off of the superior mesenteric artery at the level of pancreaticoduodenal supply. Selective arteriography was performed. Transcatheter embolization was then performed through the micro catheter with introduction of 2 mm x 4 cm, 2 mm x 4 cm and 3 mm x 6 cm Interlock soft coils. Additional arteriography was performed through the micro catheter after embolization of this branch artery. Attempt was made to catheterize an additional third order pancreaticoduodenal branch off of the SMA with the same catheter system. Ultimately, a 61 [place] Sos catheter was used to catheterize the SMA trunk followed by advancement of a Lantern micro catheter. After selective catheterization of a third order pancreaticoduodenal branch, selective arteriography was performed. Transcatheter embolization was then performed of this branch via the micro catheter with introduction of 2 mm x 4 cm, 2 mm x 2 cm, 2 mm x 4 cm and 3 mm x 5 cm Ruby soft coils. Additional arteriography was performed through the micro catheter after embolization. The micro catheter was further retracted and additional arteriography performed at the level of a second order trunk off of the SMA. Catheters were removed. Oblique arteriography centered over the femoral head was performed through the right femoral sheath prior to use of a closure device. Right femoral arteriotomy hemostasis: Cordis Exoseal COMPLICATIONS: None. FINDINGS: Initial first order SMA arteriography demonstrates prominent pancreaticoduodenal vessels off of the first second order trunk of the SMA which extends to the regions of the second,  third and fourth portions of the duodenum. There are two endo clips in close proximity to each other at the level of the third portion of the duodenum. Focal areas of hypervascular blush are noted in the second and  third portions of the duodenum which correlate in location to the described areas of angiodysplasia by prior endoscopy. Other SMA supply is normal in appearance without evidence of additional hypervascular abnormalities or AV malformations. Supply to the rest of the small bowel and proximal colon appears normal. Additional selective celiac arteriography demonstrates normal anatomy with normally patent celiac trunk, a widely patent left gastric artery and normally patent common hepatic and splenic arteries. The gastroduodenal artery is the first major branch off of the common hepatic artery and supplies branches to the region of the duodenal bulb and distal stomach. No branches are seen extending to the region of the endo clips and no hypervascular areas in the duodenum are identified with celiac injection. For this reason, embolization was targeted to the pancreaticoduodenal arcade off of the superior mesenteric artery. The pancreaticoduodenal trunk off of the SMA demonstrates a superior branch as well as middle and inferior branches. Based on arteriography, hypervascular areas of abnormality were predominantly supplied by the superior and inferior branches. The inferior branch was initially catheterized with selective arteriography demonstrating subtle hypervascular abnormality near the 2 adjacent endoscopic clips. This vessel was successfully embolized resulting in occlusion of antegrade flow with proximal injection. The superior pancreaticoduodenal branch supplying the region of the second and third portions of the duodenum was extremely difficult to catheterize due to tortuous course. Ultimately, different catheter systems did allow selective catheterization and coil embolization resulting in cessation of antegrade flow in this branch. Completion arteriography demonstrates markedly reduced vascularity of the duodenum with persistent patent supply via the middle pancreaticoduodenal branch which supplies the  region of the third portion of the duodenum. Additional branches supplying the region of the fourth portion of the duodenum also remained normally patent on completion. Adequate hemostasis was achieved at the femoral arteriotomy site. IMPRESSION: The region of abnormal duodenal bleeding due to areas of angiodysplasia visualized and treated by endoscopy and also marked by endoscopic clips were supplied by branches of the pancreaticoduodenal arcade off of the proximal SMA. The gastroduodenal artery did not appear to supply this region by arteriography. Two separate pancreaticoduodenal third order arterial branches were successfully occluded with embolization coils placed via micro catheters. This resulted in decreased vascularity to the second and third portions of the duodenum. Electronically Signed   By: Aletta Edouard M.D.   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 The patient continues to have bleeding requiring transfusion and now presents for arteriography with possible embolization to treat persistent hemorrhage. EXAM: UTERINE FIBROID EMBOLIZATION ANESTHESIA/SEDATION: 1.5 Mg IV Versed; 75 mcg IV Fentanyl. Total Moderate Sedation Time: 2 hours and 20 minutes. The patient's level of consciousness and physiologic status were continuously monitored during the procedure by Radiology nursing. CONTRAST:  150 mL Isovue-300 FLUOROSCOPY TIME:  58 minutes and 18 seconds. PROCEDURE: The procedure, risks, benefits, and alternatives were explained to the patient. Questions regarding the procedure were encouraged and answered. The patient understands and consents to the procedure. A time-out was performed prior to initiating the procedure. The right groin was prepped with chlorhexidine in a sterile fashion, and a sterile drape was applied covering the operative field. A  sterile gown and sterile gloves were used for the procedure. Local anesthesia was provided with 1% Lidocaine. Ultrasound was used to confirm patency of the right common femoral artery. After small skin incision, a 21 gauge needle was advanced into the right common femoral artery under direct ultrasound guidance. Ultrasound image documentation was performed. After establishing guide wire access, a 5-French sheath was placed. A 5 Fr Cobra catheter was advanced over a guidewire into the distal abdominal aorta. The catheter was then used to selectively catheterize the superior mesenteric artery. Selective arteriography was performed. The Cobra catheter was then advanced into the celiac axis and selective arteriography performed. The Cobra catheter was then reintroduced into the superior mesenteric artery and advanced into the proximal SMA trunk. Additional arteriography was performed. A Renegade STC micro catheter was then advanced through the 5 French catheter and into a third order branch off of the superior mesenteric artery at the level of pancreaticoduodenal supply. Selective arteriography was performed. Transcatheter embolization was then performed through the micro catheter with introduction of 2 mm x 4 cm, 2 mm x 4 cm and 3 mm x 6 cm Interlock soft coils. Additional arteriography was performed through the micro catheter after embolization of this branch artery. Attempt was made to catheterize an additional third order pancreaticoduodenal branch off of the SMA with the same catheter system. Ultimately, a 13 [place] Sos catheter was used to catheterize the SMA trunk followed by advancement of a Lantern micro catheter. After selective catheterization of a third order pancreaticoduodenal branch, selective arteriography was performed. Transcatheter embolization was then performed of this branch via the micro catheter with introduction of 2 mm x 4 cm, 2 mm x 2 cm, 2 mm x 4 cm and 3 mm x 5 cm Ruby soft coils. Additional  arteriography was performed through the micro catheter after embolization. The micro catheter was further retracted and additional arteriography performed at the level of a second order trunk off of the SMA. Catheters were removed. Oblique arteriography centered over the femoral head was performed through the right femoral sheath prior to use of a closure device. Right femoral arteriotomy hemostasis: Cordis Exoseal COMPLICATIONS: None. FINDINGS: Initial first order SMA arteriography demonstrates prominent pancreaticoduodenal vessels off of the first second order trunk of the SMA which extends to the regions of the second, third and fourth portions of the duodenum. There are two endo clips in close proximity to each other at the level of the third portion of the duodenum. Focal areas of hypervascular blush are noted in the second and third portions of the duodenum which correlate in location to the described areas of angiodysplasia by prior endoscopy. Other SMA supply is normal in appearance without evidence of additional hypervascular abnormalities or AV malformations. Supply to the rest of the small bowel and proximal colon appears normal. Additional selective celiac arteriography demonstrates normal anatomy with normally patent celiac trunk, a widely patent left gastric artery and normally patent common hepatic and splenic arteries. The gastroduodenal artery is the first major branch off of the common hepatic artery and supplies branches to the region of the duodenal bulb and distal stomach. No branches are seen extending to the region of the endo clips and no hypervascular areas in the duodenum are identified with celiac injection. For this reason, embolization was targeted to the pancreaticoduodenal arcade off of the superior mesenteric artery. The pancreaticoduodenal trunk off of the SMA demonstrates a superior branch as well as middle and inferior branches. Based on arteriography, hypervascular areas of  abnormality were predominantly supplied by the superior and inferior branches. The inferior branch was initially catheterized with selective arteriography demonstrating subtle hypervascular abnormality near the 2 adjacent endoscopic clips. This vessel was successfully embolized resulting in occlusion of antegrade flow with proximal injection. The superior pancreaticoduodenal branch supplying the region of the second and third portions of the duodenum was extremely difficult to catheterize due to tortuous course. Ultimately, different catheter systems did allow selective catheterization and coil embolization resulting in cessation of antegrade flow in this branch. Completion arteriography demonstrates markedly reduced vascularity of the duodenum with persistent patent supply via the middle pancreaticoduodenal branch which supplies the region of the third portion of the duodenum. Additional branches supplying the region of the fourth portion of the duodenum also remained normally patent on completion. Adequate hemostasis was achieved at the femoral arteriotomy site. IMPRESSION: The region of abnormal duodenal bleeding due to areas of angiodysplasia visualized and treated by endoscopy and also marked by endoscopic clips were supplied by branches of the pancreaticoduodenal arcade off of the proximal SMA. The gastroduodenal artery did not appear to supply this region by arteriography. Two separate pancreaticoduodenal third order arterial branches were successfully occluded with embolization coils placed via micro catheters. This resulted in decreased vascularity to the second and third portions of the duodenum. Electronically Signed   By: Aletta Edouard M.D.   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 The patient continues to have bleeding requiring  transfusion and now presents for arteriography with possible embolization to treat persistent hemorrhage. EXAM: UTERINE FIBROID EMBOLIZATION ANESTHESIA/SEDATION: 1.5 Mg IV Versed; 75 mcg IV Fentanyl. Total Moderate Sedation Time: 2 hours and 20 minutes. The patient's level of consciousness and physiologic status were continuously monitored during the procedure by Radiology nursing. CONTRAST:  150 mL Isovue-300 FLUOROSCOPY TIME:  58 minutes and 18 seconds. PROCEDURE: The procedure, risks, benefits, and alternatives were explained to the patient. Questions regarding the procedure were encouraged and answered. The patient understands and consents to the procedure. A time-out was performed prior to initiating the procedure. The right groin was prepped with chlorhexidine in a sterile fashion, and a sterile drape was applied covering the operative field. A sterile gown and sterile gloves were used for the procedure. Local anesthesia was provided with 1% Lidocaine. Ultrasound was used to confirm patency of the right common femoral artery. After small skin incision, a 21 gauge needle was advanced into the right common femoral artery under direct ultrasound guidance. Ultrasound image documentation was performed. After establishing guide wire access, a 5-French sheath was placed. A 5 Fr Cobra catheter was advanced over a guidewire into the distal abdominal aorta. The catheter was then used to selectively catheterize the superior mesenteric artery. Selective arteriography was performed. The Cobra catheter was then advanced into the celiac axis and selective arteriography performed. The Cobra catheter was then reintroduced into the superior mesenteric artery and advanced into the proximal SMA trunk. Additional arteriography was performed. A Renegade STC micro catheter was then advanced through the 5 French catheter and into a third order branch off of the superior mesenteric artery at the level of pancreaticoduodenal supply.  Selective arteriography was performed. Transcatheter embolization was then performed through the micro catheter with introduction of 2 mm x 4 cm, 2 mm x 4 cm and 3 mm x 6 cm Interlock soft coils. Additional arteriography was performed through  the micro catheter after embolization of this branch artery. Attempt was made to catheterize an additional third order pancreaticoduodenal branch off of the SMA with the same catheter system. Ultimately, a 30 [place] Sos catheter was used to catheterize the SMA trunk followed by advancement of a Lantern micro catheter. After selective catheterization of a third order pancreaticoduodenal branch, selective arteriography was performed. Transcatheter embolization was then performed of this branch via the micro catheter with introduction of 2 mm x 4 cm, 2 mm x 2 cm, 2 mm x 4 cm and 3 mm x 5 cm Ruby soft coils. Additional arteriography was performed through the micro catheter after embolization. The micro catheter was further retracted and additional arteriography performed at the level of a second order trunk off of the SMA. Catheters were removed. Oblique arteriography centered over the femoral head was performed through the right femoral sheath prior to use of a closure device. Right femoral arteriotomy hemostasis: Cordis Exoseal COMPLICATIONS: None. FINDINGS: Initial first order SMA arteriography demonstrates prominent pancreaticoduodenal vessels off of the first second order trunk of the SMA which extends to the regions of the second, third and fourth portions of the duodenum. There are two endo clips in close proximity to each other at the level of the third portion of the duodenum. Focal areas of hypervascular blush are noted in the second and third portions of the duodenum which correlate in location to the described areas of angiodysplasia by prior endoscopy. Other SMA supply is normal in appearance without evidence of additional hypervascular abnormalities or AV  malformations. Supply to the rest of the small bowel and proximal colon appears normal. Additional selective celiac arteriography demonstrates normal anatomy with normally patent celiac trunk, a widely patent left gastric artery and normally patent common hepatic and splenic arteries. The gastroduodenal artery is the first major branch off of the common hepatic artery and supplies branches to the region of the duodenal bulb and distal stomach. No branches are seen extending to the region of the endo clips and no hypervascular areas in the duodenum are identified with celiac injection. For this reason, embolization was targeted to the pancreaticoduodenal arcade off of the superior mesenteric artery. The pancreaticoduodenal trunk off of the SMA demonstrates a superior branch as well as middle and inferior branches. Based on arteriography, hypervascular areas of abnormality were predominantly supplied by the superior and inferior branches. The inferior branch was initially catheterized with selective arteriography demonstrating subtle hypervascular abnormality near the 2 adjacent endoscopic clips. This vessel was successfully embolized resulting in occlusion of antegrade flow with proximal injection. The superior pancreaticoduodenal branch supplying the region of the second and third portions of the duodenum was extremely difficult to catheterize due to tortuous course. Ultimately, different catheter systems did allow selective catheterization and coil embolization resulting in cessation of antegrade flow in this branch. Completion arteriography demonstrates markedly reduced vascularity of the duodenum with persistent patent supply via the middle pancreaticoduodenal branch which supplies the region of the third portion of the duodenum. Additional branches supplying the region of the fourth portion of the duodenum also remained normally patent on completion. Adequate hemostasis was achieved at the femoral arteriotomy  site. IMPRESSION: The region of abnormal duodenal bleeding due to areas of angiodysplasia visualized and treated by endoscopy and also marked by endoscopic clips were supplied by branches of the pancreaticoduodenal arcade off of the proximal SMA. The gastroduodenal artery did not appear to supply this region by arteriography. Two separate pancreaticoduodenal third order arterial branches were successfully  occluded with embolization coils placed via micro catheters. This resulted in decreased vascularity to the second and third portions of the duodenum. Electronically Signed   By: Aletta Edouard M.D.   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 The patient continues to have bleeding requiring transfusion and now presents for arteriography with possible embolization to treat persistent hemorrhage. EXAM: UTERINE FIBROID EMBOLIZATION ANESTHESIA/SEDATION: 1.5 Mg IV Versed; 75 mcg IV Fentanyl. Total Moderate Sedation Time: 2 hours and 20 minutes. The patient's level of consciousness and physiologic status were continuously monitored during the procedure by Radiology nursing. CONTRAST:  150 mL Isovue-300 FLUOROSCOPY TIME:  58 minutes and 18 seconds. PROCEDURE: The procedure, risks, benefits, and alternatives were explained to the patient. Questions regarding the procedure were encouraged and answered. The patient understands and consents to the procedure. A time-out was performed prior to initiating the procedure. The right groin was prepped with chlorhexidine in a sterile fashion, and a sterile drape was applied covering the operative field. A sterile gown and sterile gloves were used for the procedure. Local anesthesia was provided with 1% Lidocaine. Ultrasound was used to confirm patency of the right common femoral artery. After small skin incision, a 21  gauge needle was advanced into the right common femoral artery under direct ultrasound guidance. Ultrasound image documentation was performed. After establishing guide wire access, a 5-French sheath was placed. A 5 Fr Cobra catheter was advanced over a guidewire into the distal abdominal aorta. The catheter was then used to selectively catheterize the superior mesenteric artery. Selective arteriography was performed. The Cobra catheter was then advanced into the celiac axis and selective arteriography performed. The Cobra catheter was then reintroduced into the superior mesenteric artery and advanced into the proximal SMA trunk. Additional arteriography was performed. A Renegade STC micro catheter was then advanced through the 5 French catheter and into a third order branch off of the superior mesenteric artery at the level of pancreaticoduodenal supply. Selective arteriography was performed. Transcatheter embolization was then performed through the micro catheter with introduction of 2 mm x 4 cm, 2 mm x 4 cm and 3 mm x 6 cm Interlock soft coils. Additional arteriography was performed through the micro catheter after embolization of this branch artery. Attempt was made to catheterize an additional third order pancreaticoduodenal branch off of the SMA with the same catheter system. Ultimately, a 83 [place] Sos catheter was used to catheterize the SMA trunk followed by advancement of a Lantern micro catheter. After selective catheterization of a third order pancreaticoduodenal branch, selective arteriography was performed. Transcatheter embolization was then performed of this branch via the micro catheter with introduction of 2 mm x 4 cm, 2 mm x 2 cm, 2 mm x 4 cm and 3 mm x 5 cm Ruby soft coils. Additional arteriography was performed through the micro catheter after embolization. The micro catheter was further retracted and additional arteriography performed at the level of a second order trunk off of the SMA. Catheters  were removed. Oblique arteriography centered over the femoral head was performed through the right femoral sheath prior to use of a closure device. Right femoral arteriotomy hemostasis: Cordis Exoseal COMPLICATIONS: None. FINDINGS: Initial first order SMA arteriography demonstrates prominent pancreaticoduodenal vessels off of the first second order trunk of the SMA which extends to the regions of the second, third and fourth portions of the duodenum.  There are two endo clips in close proximity to each other at the level of the third portion of the duodenum. Focal areas of hypervascular blush are noted in the second and third portions of the duodenum which correlate in location to the described areas of angiodysplasia by prior endoscopy. Other SMA supply is normal in appearance without evidence of additional hypervascular abnormalities or AV malformations. Supply to the rest of the small bowel and proximal colon appears normal. Additional selective celiac arteriography demonstrates normal anatomy with normally patent celiac trunk, a widely patent left gastric artery and normally patent common hepatic and splenic arteries. The gastroduodenal artery is the first major branch off of the common hepatic artery and supplies branches to the region of the duodenal bulb and distal stomach. No branches are seen extending to the region of the endo clips and no hypervascular areas in the duodenum are identified with celiac injection. For this reason, embolization was targeted to the pancreaticoduodenal arcade off of the superior mesenteric artery. The pancreaticoduodenal trunk off of the SMA demonstrates a superior branch as well as middle and inferior branches. Based on arteriography, hypervascular areas of abnormality were predominantly supplied by the superior and inferior branches. The inferior branch was initially catheterized with selective arteriography demonstrating subtle hypervascular abnormality near the 2 adjacent  endoscopic clips. This vessel was successfully embolized resulting in occlusion of antegrade flow with proximal injection. The superior pancreaticoduodenal branch supplying the region of the second and third portions of the duodenum was extremely difficult to catheterize due to tortuous course. Ultimately, different catheter systems did allow selective catheterization and coil embolization resulting in cessation of antegrade flow in this branch. Completion arteriography demonstrates markedly reduced vascularity of the duodenum with persistent patent supply via the middle pancreaticoduodenal branch which supplies the region of the third portion of the duodenum. Additional branches supplying the region of the fourth portion of the duodenum also remained normally patent on completion. Adequate hemostasis was achieved at the femoral arteriotomy site. IMPRESSION: The region of abnormal duodenal bleeding due to areas of angiodysplasia visualized and treated by endoscopy and also marked by endoscopic clips were supplied by branches of the pancreaticoduodenal arcade off of the proximal SMA. The gastroduodenal artery did not appear to supply this region by arteriography. Two separate pancreaticoduodenal third order arterial branches were successfully occluded with embolization coils placed via micro catheters. This resulted in decreased vascularity to the second and third portions of the duodenum. Electronically Signed   By: Aletta Edouard M.D.   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 The patient continues to have bleeding requiring transfusion and now presents for arteriography with possible embolization to treat persistent hemorrhage. EXAM: UTERINE FIBROID EMBOLIZATION ANESTHESIA/SEDATION: 1.5 Mg IV Versed; 75 mcg IV Fentanyl. Total Moderate Sedation Time: 2 hours and 20  minutes. The patient's level of consciousness and physiologic status were continuously monitored during the procedure by Radiology nursing. CONTRAST:  150 mL Isovue-300 FLUOROSCOPY TIME:  58 minutes and 18 seconds. PROCEDURE: The procedure, risks, benefits, and alternatives were explained to the patient. Questions regarding the procedure were encouraged and answered. The patient understands and consents to the procedure. A time-out was performed prior to initiating the procedure. The right groin was prepped with chlorhexidine in a sterile fashion, and a sterile drape was applied covering the operative field. A sterile gown and sterile gloves were used  for the procedure. Local anesthesia was provided with 1% Lidocaine. Ultrasound was used to confirm patency of the right common femoral artery. After small skin incision, a 21 gauge needle was advanced into the right common femoral artery under direct ultrasound guidance. Ultrasound image documentation was performed. After establishing guide wire access, a 5-French sheath was placed. A 5 Fr Cobra catheter was advanced over a guidewire into the distal abdominal aorta. The catheter was then used to selectively catheterize the superior mesenteric artery. Selective arteriography was performed. The Cobra catheter was then advanced into the celiac axis and selective arteriography performed. The Cobra catheter was then reintroduced into the superior mesenteric artery and advanced into the proximal SMA trunk. Additional arteriography was performed. A Renegade STC micro catheter was then advanced through the 5 French catheter and into a third order branch off of the superior mesenteric artery at the level of pancreaticoduodenal supply. Selective arteriography was performed. Transcatheter embolization was then performed through the micro catheter with introduction of 2 mm x 4 cm, 2 mm x 4 cm and 3 mm x 6 cm Interlock soft coils. Additional arteriography was performed through the  micro catheter after embolization of this branch artery. Attempt was made to catheterize an additional third order pancreaticoduodenal branch off of the SMA with the same catheter system. Ultimately, a 68 [place] Sos catheter was used to catheterize the SMA trunk followed by advancement of a Lantern micro catheter. After selective catheterization of a third order pancreaticoduodenal branch, selective arteriography was performed. Transcatheter embolization was then performed of this branch via the micro catheter with introduction of 2 mm x 4 cm, 2 mm x 2 cm, 2 mm x 4 cm and 3 mm x 5 cm Ruby soft coils. Additional arteriography was performed through the micro catheter after embolization. The micro catheter was further retracted and additional arteriography performed at the level of a second order trunk off of the SMA. Catheters were removed. Oblique arteriography centered over the femoral head was performed through the right femoral sheath prior to use of a closure device. Right femoral arteriotomy hemostasis: Cordis Exoseal COMPLICATIONS: None. FINDINGS: Initial first order SMA arteriography demonstrates prominent pancreaticoduodenal vessels off of the first second order trunk of the SMA which extends to the regions of the second, third and fourth portions of the duodenum. There are two endo clips in close proximity to each other at the level of the third portion of the duodenum. Focal areas of hypervascular blush are noted in the second and third portions of the duodenum which correlate in location to the described areas of angiodysplasia by prior endoscopy. Other SMA supply is normal in appearance without evidence of additional hypervascular abnormalities or AV malformations. Supply to the rest of the small bowel and proximal colon appears normal. Additional selective celiac arteriography demonstrates normal anatomy with normally patent celiac trunk, a widely patent left gastric artery and normally patent common  hepatic and splenic arteries. The gastroduodenal artery is the first major branch off of the common hepatic artery and supplies branches to the region of the duodenal bulb and distal stomach. No branches are seen extending to the region of the endo clips and no hypervascular areas in the duodenum are identified with celiac injection. For this reason, embolization was targeted to the pancreaticoduodenal arcade off of the superior mesenteric artery. The pancreaticoduodenal trunk off of the SMA demonstrates a superior branch as well as middle and inferior branches. Based on arteriography, hypervascular areas of abnormality were predominantly supplied by the  superior and inferior branches. The inferior branch was initially catheterized with selective arteriography demonstrating subtle hypervascular abnormality near the 2 adjacent endoscopic clips. This vessel was successfully embolized resulting in occlusion of antegrade flow with proximal injection. The superior pancreaticoduodenal branch supplying the region of the second and third portions of the duodenum was extremely difficult to catheterize due to tortuous course. Ultimately, different catheter systems did allow selective catheterization and coil embolization resulting in cessation of antegrade flow in this branch. Completion arteriography demonstrates markedly reduced vascularity of the duodenum with persistent patent supply via the middle pancreaticoduodenal branch which supplies the region of the third portion of the duodenum. Additional branches supplying the region of the fourth portion of the duodenum also remained normally patent on completion. Adequate hemostasis was achieved at the femoral arteriotomy site. IMPRESSION: The region of abnormal duodenal bleeding due to areas of angiodysplasia visualized and treated by endoscopy and also marked by endoscopic clips were supplied by branches of the pancreaticoduodenal arcade off of the proximal SMA. The  gastroduodenal artery did not appear to supply this region by arteriography. Two separate pancreaticoduodenal third order arterial branches were successfully occluded with embolization coils placed via micro catheters. This resulted in decreased vascularity to the second and third portions of the duodenum. Electronically Signed   By: Aletta Edouard M.D.   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 The patient continues to have bleeding requiring transfusion and now presents for arteriography with possible embolization to treat persistent hemorrhage. EXAM: UTERINE FIBROID EMBOLIZATION ANESTHESIA/SEDATION: 1.5 Mg IV Versed; 75 mcg IV Fentanyl. Total Moderate Sedation Time: 2 hours and 20 minutes. The patient's level of consciousness and physiologic status were continuously monitored during the procedure by Radiology nursing. CONTRAST:  150 mL Isovue-300 FLUOROSCOPY TIME:  58 minutes and 18 seconds. PROCEDURE: The procedure, risks, benefits, and alternatives were explained to the patient. Questions regarding the procedure were encouraged and answered. The patient understands and consents to the procedure. A time-out was performed prior to initiating the procedure. The right groin was prepped with chlorhexidine in a sterile fashion, and a sterile drape was applied covering the operative field. A sterile gown and sterile gloves were used for the procedure. Local anesthesia was provided with 1% Lidocaine. Ultrasound was used to confirm patency of the right common femoral artery. After small skin incision, a 21 gauge needle was advanced into the right common femoral artery under direct ultrasound guidance. Ultrasound image documentation was performed. After establishing guide wire access, a 5-French sheath was placed. A 5 Fr Cobra catheter was advanced over a guidewire into  the distal abdominal aorta. The catheter was then used to selectively catheterize the superior mesenteric artery. Selective arteriography was performed. The Cobra catheter was then advanced into the celiac axis and selective arteriography performed. The Cobra catheter was then reintroduced into the superior mesenteric artery and advanced into the proximal SMA trunk. Additional arteriography was performed. A Renegade STC micro catheter was then advanced through the 5 French catheter and into a third order branch off of the superior mesenteric artery at the level of pancreaticoduodenal supply. Selective arteriography was performed. Transcatheter embolization was then performed through the micro catheter with introduction of 2 mm x 4 cm, 2 mm x 4 cm and 3 mm x 6 cm Interlock soft coils. Additional arteriography was performed through the micro catheter after embolization of  this branch artery. Attempt was made to catheterize an additional third order pancreaticoduodenal branch off of the SMA with the same catheter system. Ultimately, a 54 [place] Sos catheter was used to catheterize the SMA trunk followed by advancement of a Lantern micro catheter. After selective catheterization of a third order pancreaticoduodenal branch, selective arteriography was performed. Transcatheter embolization was then performed of this branch via the micro catheter with introduction of 2 mm x 4 cm, 2 mm x 2 cm, 2 mm x 4 cm and 3 mm x 5 cm Ruby soft coils. Additional arteriography was performed through the micro catheter after embolization. The micro catheter was further retracted and additional arteriography performed at the level of a second order trunk off of the SMA. Catheters were removed. Oblique arteriography centered over the femoral head was performed through the right femoral sheath prior to use of a closure device. Right femoral arteriotomy hemostasis: Cordis Exoseal COMPLICATIONS: None. FINDINGS: Initial first order SMA  arteriography demonstrates prominent pancreaticoduodenal vessels off of the first second order trunk of the SMA which extends to the regions of the second, third and fourth portions of the duodenum. There are two endo clips in close proximity to each other at the level of the third portion of the duodenum. Focal areas of hypervascular blush are noted in the second and third portions of the duodenum which correlate in location to the described areas of angiodysplasia by prior endoscopy. Other SMA supply is normal in appearance without evidence of additional hypervascular abnormalities or AV malformations. Supply to the rest of the small bowel and proximal colon appears normal. Additional selective celiac arteriography demonstrates normal anatomy with normally patent celiac trunk, a widely patent left gastric artery and normally patent common hepatic and splenic arteries. The gastroduodenal artery is the first major branch off of the common hepatic artery and supplies branches to the region of the duodenal bulb and distal stomach. No branches are seen extending to the region of the endo clips and no hypervascular areas in the duodenum are identified with celiac injection. For this reason, embolization was targeted to the pancreaticoduodenal arcade off of the superior mesenteric artery. The pancreaticoduodenal trunk off of the SMA demonstrates a superior branch as well as middle and inferior branches. Based on arteriography, hypervascular areas of abnormality were predominantly supplied by the superior and inferior branches. The inferior branch was initially catheterized with selective arteriography demonstrating subtle hypervascular abnormality near the 2 adjacent endoscopic clips. This vessel was successfully embolized resulting in occlusion of antegrade flow with proximal injection. The superior pancreaticoduodenal branch supplying the region of the second and third portions of the duodenum was extremely difficult  to catheterize due to tortuous course. Ultimately, different catheter systems did allow selective catheterization and coil embolization resulting in cessation of antegrade flow in this branch. Completion arteriography demonstrates markedly reduced vascularity of the duodenum with persistent patent supply via the middle pancreaticoduodenal branch which supplies the region of the third portion of the duodenum. Additional branches supplying the region of the fourth portion of the duodenum also remained normally patent on completion. Adequate hemostasis was achieved at the femoral arteriotomy site. IMPRESSION: The region of abnormal duodenal bleeding due to areas of angiodysplasia visualized and treated by endoscopy and also marked by endoscopic clips were supplied by branches of the pancreaticoduodenal arcade off of the proximal SMA. The gastroduodenal artery did not appear to supply this region by arteriography. Two separate pancreaticoduodenal third order arterial branches were successfully occluded with embolization coils placed via  micro catheters. This resulted in decreased vascularity to the second and third portions of the duodenum. Electronically Signed   By: Aletta Edouard M.D.   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 The patient continues to have bleeding requiring transfusion and now presents for arteriography with possible embolization to treat persistent hemorrhage. EXAM: UTERINE FIBROID EMBOLIZATION ANESTHESIA/SEDATION: 1.5 Mg IV Versed; 75 mcg IV Fentanyl. Total Moderate Sedation Time: 2 hours and 20 minutes. The patient's level of consciousness and physiologic status were continuously monitored during the procedure by Radiology nursing. CONTRAST:  150 mL Isovue-300 FLUOROSCOPY TIME:  58 minutes and 18 seconds. PROCEDURE:  The procedure, risks, benefits, and alternatives were explained to the patient. Questions regarding the procedure were encouraged and answered. The patient understands and consents to the procedure. A time-out was performed prior to initiating the procedure. The right groin was prepped with chlorhexidine in a sterile fashion, and a sterile drape was applied covering the operative field. A sterile gown and sterile gloves were used for the procedure. Local anesthesia was provided with 1% Lidocaine. Ultrasound was used to confirm patency of the right common femoral artery. After small skin incision, a 21 gauge needle was advanced into the right common femoral artery under direct ultrasound guidance. Ultrasound image documentation was performed. After establishing guide wire access, a 5-French sheath was placed. A 5 Fr Cobra catheter was advanced over a guidewire into the distal abdominal aorta. The catheter was then used to selectively catheterize the superior mesenteric artery. Selective arteriography was performed. The Cobra catheter was then advanced into the celiac axis and selective arteriography performed. The Cobra catheter was then reintroduced into the superior mesenteric artery and advanced into the proximal SMA trunk. Additional arteriography was performed. A Renegade STC micro catheter was then advanced through the 5 French catheter and into a third order branch off of the superior mesenteric artery at the level of pancreaticoduodenal supply. Selective arteriography was performed. Transcatheter embolization was then performed through the micro catheter with introduction of 2 mm x 4 cm, 2 mm x 4 cm and 3 mm x 6 cm Interlock soft coils. Additional arteriography was performed through the micro catheter after embolization of this branch artery. Attempt was made to catheterize an additional third order pancreaticoduodenal branch off of the SMA with the same catheter system. Ultimately, a 12 [place] Sos catheter  was used to catheterize the SMA trunk followed by advancement of a Lantern micro catheter. After selective catheterization of a third order pancreaticoduodenal branch, selective arteriography was performed. Transcatheter embolization was then performed of this branch via the micro catheter with introduction of 2 mm x 4 cm, 2 mm x 2 cm, 2 mm x 4 cm and 3 mm x 5 cm Ruby soft coils. Additional arteriography was performed through the micro catheter after embolization. The micro catheter was further retracted and additional arteriography performed at the level of a second order trunk off of the SMA. Catheters were removed. Oblique arteriography centered over the femoral head was performed through the right femoral sheath prior to use of a closure device. Right femoral arteriotomy hemostasis: Cordis Exoseal COMPLICATIONS: None. FINDINGS: Initial first order SMA arteriography demonstrates prominent pancreaticoduodenal vessels off of the first second order trunk of the SMA which extends to the regions of the second, third and fourth portions of the duodenum.  There are two endo clips in close proximity to each other at the level of the third portion of the duodenum. Focal areas of hypervascular blush are noted in the second and third portions of the duodenum which correlate in location to the described areas of angiodysplasia by prior endoscopy. Other SMA supply is normal in appearance without evidence of additional hypervascular abnormalities or AV malformations. Supply to the rest of the small bowel and proximal colon appears normal. Additional selective celiac arteriography demonstrates normal anatomy with normally patent celiac trunk, a widely patent left gastric artery and normally patent common hepatic and splenic arteries. The gastroduodenal artery is the first major branch off of the common hepatic artery and supplies branches to the region of the duodenal bulb and distal stomach. No branches are seen extending to  the region of the endo clips and no hypervascular areas in the duodenum are identified with celiac injection. For this reason, embolization was targeted to the pancreaticoduodenal arcade off of the superior mesenteric artery. The pancreaticoduodenal trunk off of the SMA demonstrates a superior branch as well as middle and inferior branches. Based on arteriography, hypervascular areas of abnormality were predominantly supplied by the superior and inferior branches. The inferior branch was initially catheterized with selective arteriography demonstrating subtle hypervascular abnormality near the 2 adjacent endoscopic clips. This vessel was successfully embolized resulting in occlusion of antegrade flow with proximal injection. The superior pancreaticoduodenal branch supplying the region of the second and third portions of the duodenum was extremely difficult to catheterize due to tortuous course. Ultimately, different catheter systems did allow selective catheterization and coil embolization resulting in cessation of antegrade flow in this branch. Completion arteriography demonstrates markedly reduced vascularity of the duodenum with persistent patent supply via the middle pancreaticoduodenal branch which supplies the region of the third portion of the duodenum. Additional branches supplying the region of the fourth portion of the duodenum also remained normally patent on completion. Adequate hemostasis was achieved at the femoral arteriotomy site. IMPRESSION: The region of abnormal duodenal bleeding due to areas of angiodysplasia visualized and treated by endoscopy and also marked by endoscopic clips were supplied by branches of the pancreaticoduodenal arcade off of the proximal SMA. The gastroduodenal artery did not appear to supply this region by arteriography. Two separate pancreaticoduodenal third order arterial branches were successfully occluded with embolization coils placed via micro catheters. This resulted  in decreased vascularity to the second and third portions of the duodenum. Electronically Signed   By: Aletta Edouard M.D.   On: 04/22/2016 14:57    Labs:  CBC:  Recent Labs  04/22/16 0340 04/22/16 1547 04/23/16 0540 04/23/16 1623 04/24/16 0224  WBC 6.8  --  7.7 8.9 6.9  HGB 7.7* 7.2* 8.3* 8.8* 7.4*  HCT 24.3* 23.0* 25.6* 27.3* 23.4*  PLT 157  --  144* 164 151    COAGS:  Recent Labs  04/09/16 1831 04/16/16 2324 04/21/16 0911 04/22/16 0340  INR 1.25 1.22 1.15 1.12    BMP:  Recent Labs  04/17/16 0901 04/21/16 1007 04/22/16 0340 04/23/16 0540  NA 139 139 139 137  K 4.5 4.8 4.4 4.5  CL 112* 108 107 105  CO2 17* 24 21* 24  GLUCOSE 88 94 115* 91  BUN 29* 28* 26* 24*  CALCIUM 7.9* 8.1* 8.0* 7.8*  CREATININE 0.92 0.90 0.96 0.75  GFRNONAA 57* 59* 54* >60  GFRAA >60 >60 >60 >60    LIVER FUNCTION TESTS:  Recent Labs  10/05/15 1504 04/09/16 1646 04/16/16  2324 04/22/16 0340  BILITOT 0.3 0.2* 0.2* 0.5  AST 14 17 21 16   ALT 10 11* 15 12*  ALKPHOS 52 39 39 36*  PROT 6.6 5.5* 5.3* 4.6*  ALBUMIN 3.9 3.1* 2.9* 2.3*    Assessment and Plan:  pancreaticoduodenal arteries embolization 5/7 in IR Doing well Plan per Dr Silverio Decamp Call if need [place]  Electronically Signed: Duong Haydel A 04/24/2016, 7:38 AM   I spent a total of 15 Minutes at the the patient's bedside AND on the patient's hospital floor or unit, greater than 50% of which was counseling/coordinating care for pancreaticoduodenal artery embo

## 2016-04-25 LAB — PREPARE RBC (CROSSMATCH)

## 2016-04-25 LAB — BASIC METABOLIC PANEL
ANION GAP: 7 (ref 5–15)
BUN: 25 mg/dL — ABNORMAL HIGH (ref 6–20)
CHLORIDE: 105 mmol/L (ref 101–111)
CO2: 26 mmol/L (ref 22–32)
Calcium: 7.9 mg/dL — ABNORMAL LOW (ref 8.9–10.3)
Creatinine, Ser: 0.96 mg/dL (ref 0.44–1.00)
GFR calc non Af Amer: 54 mL/min — ABNORMAL LOW (ref >60)
GLUCOSE: 105 mg/dL — AB (ref 65–99)
Potassium: 4.3 mmol/L (ref 3.5–5.1)
Sodium: 138 mmol/L (ref 135–145)

## 2016-04-25 LAB — HEMOGLOBIN AND HEMATOCRIT, BLOOD
HCT: 20.6 % — ABNORMAL LOW (ref 36.0–46.0)
HEMATOCRIT: 21.4 % — AB (ref 36.0–46.0)
HEMATOCRIT: 28.3 % — AB (ref 36.0–46.0)
HEMOGLOBIN: 6.7 g/dL — AB (ref 12.0–15.0)
HEMOGLOBIN: 6.6 g/dL — AB (ref 12.0–15.0)
Hemoglobin: 9.1 g/dL — ABNORMAL LOW (ref 12.0–15.0)

## 2016-04-25 LAB — PROTIME-INR
INR: 1.1 (ref 0.00–1.49)
PROTHROMBIN TIME: 14.4 s (ref 11.6–15.2)

## 2016-04-25 MED ORDER — SODIUM CHLORIDE 0.9 % IV SOLN
Freq: Once | INTRAVENOUS | Status: AC
  Administered 2016-04-25: 08:00:00 via INTRAVENOUS

## 2016-04-25 NOTE — Consult Note (Signed)
Chief Complaint: Patient was seen in consultation today for mesenteric arteriogram with possible embolization Chief Complaint  Patient presents with  . Chest Pain  . Abnormal Lab   at the request of Dr Ralene Ok  Referring Physician(s): Dr Ralene Ok  Supervising Physician: Corrie Mckusick  Patient Status: In-pt   History of Present Illness: Nicole Perez is a 82 y.o. female   Procedure: Celiac and SMA visceral arteriography; Embolization of pancreaticoduodenal arteries 04/22/16  No active bleeding noted for few days New drop in hgb yesterday and dropping this am Hgb 6.6 this am No bleeding from rectum per pt Noted some bradycardia last pm Request made for another mesenteric arteriogram with possible embolization Reviewed with Dr Henrene Dodge procedure   Past Medical History  Diagnosis Date  . Essential hypertension, benign 04/18/2010  . Lichen sclerosus et atrophicus of the vulva   . BACK PAIN, CHRONIC 04/18/2010    degenerative spine disease, spinal stenosis throughout spine  . DEGENERATIVE JOINT DISEASE, HIPS 09/11/2007    Multilevel degenerative spine dz and spinal stenosis  . INSOMNIA, CHRONIC 04/18/2010  . Parotid adenoma 1990, 2012    Right parotid, recurrent parotid pleimorphic adenoma.   . Osteoarthritis, multiple sites 08/11/2013  . Cystocele 08/11/2013  . Acute renal failure ([place]) 02/23/2014  . History of pneumonia 07/26/2011  . Rectal fissure 12/16/2013  . UTI (urinary tract infection) 02/22/2014  . 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 Hearing loss sensory, bilateral 07/26/2011    Right >> Left.  Left ear hearing aid b/c work discrimination in       R. ear is very poor. Audiologist-Stephanie Nance at [facility] in [place].  (05/10/2010)   . HYPERLIPIDEMIA 05/10/2010    Qualifier: Diagnosis of  By: McDiarmid MD, Sherren Mocha    . Incontinence overflow, urine 04/28/2014  .  Incomplete emptying of bladder 02/09/2014  . Paresthesia of both feet 08/06/2014  . Orthostatic hypotension 08/06/2014  . Blepharitis 11/16/2014    Diagnosis by optometrist, Renaldo Harrison on exam 11/13/2014  . Dry eye syndrome 11/16/2014  . Glaucoma suspect 11/16/2014  . Blood in stool 12/20/2014  . Cholelithiasis   . Colon polyps 2006. 2016    adenomatous and hyperplaxtic  . H. pylori infection 2016    h pylori erosive gastritis, treated with PPI, antibiotics.   . Hiatal hernia 05/05/2015    Large Hiatal Hernia found on EGD by Dr Hilarie Fredrickson (GI in [place]) in work up of melena and (+) FOBT.  Marland Kitchen Urethral polyp 11/17/2015  . 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  ablated.   . Overflow incontinence 04/28/2014  . Complicated UTI (urinary tract infection) 01/30/2016    Past Surgical History  Procedure Laterality Date  . Total abdominal hysterectomy w/ bilateral salpingoophorectomy  1964    Hysterectomy and bilateral oopherectomy at age 15 for benign reasons  . Bladder suspension      Bladder tack x 2 (Dr Janice Norrie)  . Cystocele repair      Rectal prolapse and cyctocele adter hysterectomy requiring anterior repair Jerilynn Mages Edwinna Areola, MD)  . Rectocele repair      Rectal prolapse and cyctocele adter hysterectomy requiring anterior repair Jerilynn Mages Edwinna Areola, MD)  . Urethral dilation    . Parotid gland tumor excision  1990    S/P excision of Right Parotid Gland Benign Tumor, 1990  . Reconstruction of nose  1994    Nasal bridge reconstruction (Dr Judie Grieve, 1994) for following  Forklift accident on job. Surgery complicated by nerve damage resulting in difficulty raising right eyebrow   . Carpal tunnel release  2010    Carpel Tunnel Release of  left wrist  03/2009 (Dr Fredna Dow): Nerve   . Cataract extraction w/ intraocular lens implant  2010    Dr Charise Killian (ophth)  . Breast lumpectomy      Lumpectomy of benign Breast lumps bilaterally, Dr Bubba Camp  . Laminectomy      S/P L4-5 Laminectomy (1987) for  decompression of spinal stenosis  . Colonoscopy w/ polypectomy  2006  . Parotidectomy  08/30/11    Radene Journey, MD (ENT) for recurrent right parotid pleomorphic adenoma by frozen section  . Carpal tunnel release      right wrist  . Esophagogastroduodenoscopy N/A 04/10/2016    Procedure: ESOPHAGOGASTRODUODENOSCOPY (EGD);  Surgeon: Irene Shipper, MD;  Location: [facility] ENDOSCOPY;  Service: Endoscopy;  Laterality: N/A;  . Enteroscopy N/A 04/18/2016    Procedure: ENTEROSCOPY;  Surgeon: Mauri Pole, MD;  Location: [facility] ENDOSCOPY;  Service: Endoscopy;  Laterality: N/A;    Allergies: Nitrofurantoin  Medications: Prior to Admission medications   Medication Sig Start Date End Date Taking? Authorizing Provider  esomeprazole (NEXIUM) 40 MG capsule Take 1 capsule (40 mg total) by mouth daily. 04/16/16  Yes Olam Idler, MD  ferrous sulfate 325 (65 FE) MG tablet Take 1 tablet (325 mg total) by mouth 2 (two) times daily with a meal. 04/11/16  Yes Mercy Riding, MD  HYDROcodone-acetaminophen (NORCO) 10-325 MG tablet Take 1 tablet by mouth every 12 (twelve) hours as needed for moderate pain or severe pain. Patient taking differently: Take 1 tablet by mouth every 8 (eight) hours as needed for moderate pain or severe pain.  04/16/16  Yes Lind Covert, MD  lidocaine (XYLOCAINE) 2 % jelly Apply 1 dime size amount of gel to the area between your vagina and rectum once daily for pain. You may use up on 2 times per day if needed 03/12/16  Yes Caren Macadam, MD  magnesium hydroxide (MILK OF MAGNESIA) 400 MG/5ML suspension Take 30 mLs by mouth daily as needed for mild constipation.   Yes Historical Provider, MD  polyethylene glycol powder (GLYCOLAX/MIRALAX) powder Take 17 g by mouth daily as needed for mild constipation, moderate constipation or severe constipation. 02/14/16  Yes Blane Ohara McDiarmid, MD     Family History  Problem Relation Age of Onset  . Coronary artery disease Mother   . Coronary artery  disease Sister     open heart surgery  . Diabetes type II Sister   . Stroke Father 66  . Hypertension Father   . Lupus Brother   . Heart disease Brother   . Heart attack Sister     Social History   Social History  . Marital Status: Widowed    Spouse Name: N/A  . Number of Children: N/A  . Years of Education: N/A   Occupational History  . retired    Social History Main Topics  . Smoking status: Former Smoker    Quit date: 01/14/1984  . Smokeless tobacco: Former Systems developer  . Alcohol Use: No  . Drug Use: No  . Sexual Activity: No     Comment: hysterectomy   Other Topics Concern  . None   Social History Narrative   Has 8 children   [name], [name], involved in her care  4 brothers, 5 sisters, 3 deceased siblings   Cares for her great-grandchildren during the day   Widowed in 2009   Lives alone.   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                Review of Systems: A 12 point ROS discussed and pertinent positives are indicated in the HPI above.  All other systems are negative.  Review of Systems  Constitutional: Positive for activity change, appetite change and fatigue. Negative for fever.  Respiratory: Negative for shortness of breath.   Gastrointestinal: Negative for abdominal pain.  Neurological: Positive for weakness.  Psychiatric/Behavioral: Negative for behavioral problems and confusion.    Vital Signs: BP 93/48 mmHg  Pulse 53  Temp(Src) 98.5 F (36.9 C) (Oral)  Resp 15  Ht 4\' 11"  (1.499 m)  Wt 148 lb 2.4 oz (67.2 kg)  BMI 29.91 kg/m2  SpO2 99%  LMP 12/17/1962  Physical Exam  Constitutional: She is oriented to person, place, and time.  Cardiovascular: Normal rate, regular rhythm and normal heart sounds.   Pulmonary/Chest: Effort normal. She has no wheezes.  Abdominal: Soft.  Bowel sounds are normal.  Musculoskeletal: Normal range of motion.  Neurological: She is alert and oriented to person, place, and time.  Skin: Skin is warm and dry.  Psychiatric: She has a normal mood and affect. Her behavior is normal. Judgment and thought content normal.  Nursing note and vitals reviewed.   Mallampati Score:  MD Evaluation Airway: WNL Heart: WNL Abdomen: WNL Chest/ Lungs: WNL ASA  Classification: 3 Mallampati/Airway Score: One  Imaging: Dg Chest 2 View  04/09/2016  CLINICAL DATA:  Centralized chest pain for 1 day. Squeezing sensation, palpitations for 1 day. EXAM: CHEST  2 VIEW COMPARISON:  02/22/2014 FINDINGS: Heart and mediastinal contours are within normal limits. No focal opacities or effusions. No acute bony abnormality. Degenerative changes in the shoulders. IMPRESSION: No active cardiopulmonary disease. Electronically Signed   By: Rolm Baptise M.D.   On: 04/09/2016 15:51   Nm Gi Blood Loss  04/20/2016  CLINICAL DATA:  Symptomatic anemia. GI bleed. History of angiodysplasia. 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 EXAM: NUCLEAR MEDICINE GASTROINTESTINAL BLEEDING SCAN TECHNIQUE: Sequential abdominal images were obtained following intravenous administration of Tc-18m labeled red blood cells. RADIOPHARMACEUTICALS:  Twenty-seven mCi Tc-74m in-vitro labeled red cells. COMPARISON:  CT abdomen pelvis - 12/18/2014; 12/27/2013 FINDINGS: There is a very small amount of suspected intraluminal activity seen within the suspected proximal small bowel on the initial 60 minutes cine images. No definitive abnormal activity is seen on the provided anterior projection planar images. No definitive abnormal activity is seen on the subsequent 65 - 120 minute cine or anterior projection planar images. Physiologic activity is seen within the heart, liver and major vascular structures of the abdomen and pelvis. IMPRESSION: Trace/faint amount of intraluminal  activity seen only on the initial 60 minute cine images, not seen on the subsequent 65 to 120 minute images suggestive of a small, intermittent bleed. 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 Critical Value/emergent results were called by telephone at the time of interpretation on 04/20/2016 at  5:57 pm to Dr. Wendee Beavers , who verbally acknowledged these results. Electronically Signed   By: Sandi Mariscal M.D.   On: 04/20/2016 18:01   Ir Angiogram Visceral Selective  04/22/2016  CLINICAL DATA:  Refractory upper GI bleed from bleeding duodenal angiodysplasia with two prior endoscopic laser treatments. The patient continues to have bleeding requiring transfusion and now presents for arteriography with possible embolization to treat persistent hemorrhage. EXAM: UTERINE FIBROID EMBOLIZATION ANESTHESIA/SEDATION: 1.5 Mg IV Versed; 75 mcg IV Fentanyl. Total Moderate Sedation Time: 2 hours and 20 minutes. The patient's level of consciousness and physiologic status were continuously monitored during the procedure by Radiology nursing. CONTRAST:  150 mL Isovue-300 FLUOROSCOPY TIME:  58 minutes and 18 seconds. PROCEDURE: The procedure, risks, benefits, and alternatives were explained to the patient. Questions regarding the procedure were encouraged and answered. The patient understands and consents to the procedure. A time-out was performed prior to initiating the procedure. The right groin was prepped with chlorhexidine in a sterile fashion, and a sterile drape was applied covering the operative field. A sterile gown and sterile gloves were used for the procedure. Local anesthesia was provided with 1% Lidocaine. Ultrasound was used to confirm patency of the right common femoral artery. After small skin incision, a 21 gauge needle was advanced into the right common femoral artery under direct ultrasound guidance.  Ultrasound image documentation was performed. After establishing guide wire access, a 5-French sheath was placed. A 5 Fr Cobra catheter was advanced over a guidewire into the distal abdominal aorta. The catheter was then used to selectively catheterize the superior mesenteric artery. Selective arteriography was performed. The Cobra catheter was then advanced into the celiac axis and selective arteriography performed. The Cobra catheter was then reintroduced into the superior mesenteric artery and advanced into the proximal SMA trunk. Additional arteriography was performed. A Renegade STC micro catheter was then advanced through the 5 French catheter and into a third order branch off of the superior mesenteric artery at the level of pancreaticoduodenal supply. Selective arteriography was performed. Transcatheter embolization was then performed through the micro catheter with introduction of 2 mm x 4 cm, 2 mm x 4 cm and 3 mm x 6 cm Interlock soft coils. Additional arteriography was performed through the micro catheter after embolization of this branch artery. Attempt was made to catheterize an additional third order pancreaticoduodenal branch off of the SMA with the same catheter system. Ultimately, a 70 [place] Sos catheter was used to catheterize the SMA trunk followed by advancement of a Lantern micro catheter. After selective catheterization of a third order pancreaticoduodenal branch, selective arteriography was performed. Transcatheter embolization was then performed of this branch via the micro catheter with introduction of 2 mm x 4 cm, 2 mm x 2 cm, 2 mm x 4 cm and 3 mm x 5 cm Ruby soft coils. Additional arteriography was performed through the micro catheter after embolization. The micro catheter was further retracted and additional arteriography performed at the level of a second order trunk off of the SMA. Catheters were removed. Oblique arteriography centered over the femoral head was performed through the  right femoral sheath prior to use of a closure device. Right femoral arteriotomy hemostasis: Cordis Exoseal COMPLICATIONS: None. FINDINGS: Initial first order SMA arteriography demonstrates prominent pancreaticoduodenal vessels off of the first second order trunk of the SMA which extends to the regions of the second, third and fourth portions of the duodenum. There are two endo clips in close proximity to each other at  the level of the third portion of the duodenum. Focal areas of hypervascular blush are noted in the second and third portions of the duodenum which correlate in location to the described areas of angiodysplasia by prior endoscopy. Other SMA supply is normal in appearance without evidence of additional hypervascular abnormalities or AV malformations. Supply to the rest of the small bowel and proximal colon appears normal. Additional selective celiac arteriography demonstrates normal anatomy with normally patent celiac trunk, a widely patent left gastric artery and normally patent common hepatic and splenic arteries. The gastroduodenal artery is the first major branch off of the common hepatic artery and supplies branches to the region of the duodenal bulb and distal stomach. No branches are seen extending to the region of the endo clips and no hypervascular areas in the duodenum are identified with celiac injection. For this reason, embolization was targeted to the pancreaticoduodenal arcade off of the superior mesenteric artery. The pancreaticoduodenal trunk off of the SMA demonstrates a superior branch as well as middle and inferior branches. Based on arteriography, hypervascular areas of abnormality were predominantly supplied by the superior and inferior branches. The inferior branch was initially catheterized with selective arteriography demonstrating subtle hypervascular abnormality near the 2 adjacent endoscopic clips. This vessel was successfully embolized resulting in occlusion of antegrade  flow with proximal injection. The superior pancreaticoduodenal branch supplying the region of the second and third portions of the duodenum was extremely difficult to catheterize due to tortuous course. Ultimately, different catheter systems did allow selective catheterization and coil embolization resulting in cessation of antegrade flow in this branch. Completion arteriography demonstrates markedly reduced vascularity of the duodenum with persistent patent supply via the middle pancreaticoduodenal branch which supplies the region of the third portion of the duodenum. Additional branches supplying the region of the fourth portion of the duodenum also remained normally patent on completion. Adequate hemostasis was achieved at the femoral arteriotomy site. IMPRESSION: The region of abnormal duodenal bleeding due to areas of angiodysplasia visualized and treated by endoscopy and also marked by endoscopic clips were supplied by branches of the pancreaticoduodenal arcade off of the proximal SMA. The gastroduodenal artery did not appear to supply this region by arteriography. Two separate pancreaticoduodenal third order arterial branches were successfully occluded with embolization coils placed via micro catheters. This resulted in decreased vascularity to the second and third portions of the duodenum. Electronically Signed   By: Aletta Edouard M.D.   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 The patient continues to have bleeding requiring transfusion and now presents for arteriography with possible embolization to treat persistent hemorrhage. EXAM: UTERINE FIBROID EMBOLIZATION ANESTHESIA/SEDATION: 1.5 Mg IV Versed; 75 mcg IV Fentanyl. Total Moderate Sedation Time: 2 hours and 20 minutes. The patient's level of consciousness and physiologic status were continuously  monitored during the procedure by Radiology nursing. CONTRAST:  150 mL Isovue-300 FLUOROSCOPY TIME:  58 minutes and 18 seconds. PROCEDURE: The procedure, risks, benefits, and alternatives were explained to the patient. Questions regarding the procedure were encouraged and answered. The patient understands and consents to the procedure. A time-out was performed prior to initiating the procedure. The right groin was prepped with chlorhexidine in a sterile fashion, and a sterile drape was applied covering the operative field. A sterile gown and sterile gloves were used for the procedure. Local anesthesia was provided with 1% Lidocaine. Ultrasound was used  to confirm patency of the right common femoral artery. After small skin incision, a 21 gauge needle was advanced into the right common femoral artery under direct ultrasound guidance. Ultrasound image documentation was performed. After establishing guide wire access, a 5-French sheath was placed. A 5 Fr Cobra catheter was advanced over a guidewire into the distal abdominal aorta. The catheter was then used to selectively catheterize the superior mesenteric artery. Selective arteriography was performed. The Cobra catheter was then advanced into the celiac axis and selective arteriography performed. The Cobra catheter was then reintroduced into the superior mesenteric artery and advanced into the proximal SMA trunk. Additional arteriography was performed. A Renegade STC micro catheter was then advanced through the 5 French catheter and into a third order branch off of the superior mesenteric artery at the level of pancreaticoduodenal supply. Selective arteriography was performed. Transcatheter embolization was then performed through the micro catheter with introduction of 2 mm x 4 cm, 2 mm x 4 cm and 3 mm x 6 cm Interlock soft coils. Additional arteriography was performed through the micro catheter after embolization of this branch artery. Attempt was made to  catheterize an additional third order pancreaticoduodenal branch off of the SMA with the same catheter system. Ultimately, a 54 [place] Sos catheter was used to catheterize the SMA trunk followed by advancement of a Lantern micro catheter. After selective catheterization of a third order pancreaticoduodenal branch, selective arteriography was performed. Transcatheter embolization was then performed of this branch via the micro catheter with introduction of 2 mm x 4 cm, 2 mm x 2 cm, 2 mm x 4 cm and 3 mm x 5 cm Ruby soft coils. Additional arteriography was performed through the micro catheter after embolization. The micro catheter was further retracted and additional arteriography performed at the level of a second order trunk off of the SMA. Catheters were removed. Oblique arteriography centered over the femoral head was performed through the right femoral sheath prior to use of a closure device. Right femoral arteriotomy hemostasis: Cordis Exoseal COMPLICATIONS: None. FINDINGS: Initial first order SMA arteriography demonstrates prominent pancreaticoduodenal vessels off of the first second order trunk of the SMA which extends to the regions of the second, third and fourth portions of the duodenum. There are two endo clips in close proximity to each other at the level of the third portion of the duodenum. Focal areas of hypervascular blush are noted in the second and third portions of the duodenum which correlate in location to the described areas of angiodysplasia by prior endoscopy. Other SMA supply is normal in appearance without evidence of additional hypervascular abnormalities or AV malformations. Supply to the rest of the small bowel and proximal colon appears normal. Additional selective celiac arteriography demonstrates normal anatomy with normally patent celiac trunk, a widely patent left gastric artery and normally patent common hepatic and splenic arteries. The gastroduodenal artery is the first major branch  off of the common hepatic artery and supplies branches to the region of the duodenal bulb and distal stomach. No branches are seen extending to the region of the endo clips and no hypervascular areas in the duodenum are identified with celiac injection. For this reason, embolization was targeted to the pancreaticoduodenal arcade off of the superior mesenteric artery. The pancreaticoduodenal trunk off of the SMA demonstrates a superior branch as well as middle and inferior branches. Based on arteriography, hypervascular areas of abnormality were predominantly supplied by the superior and inferior branches. The inferior branch was initially catheterized with selective arteriography  demonstrating subtle hypervascular abnormality near the 2 adjacent endoscopic clips. This vessel was successfully embolized resulting in occlusion of antegrade flow with proximal injection. The superior pancreaticoduodenal branch supplying the region of the second and third portions of the duodenum was extremely difficult to catheterize due to tortuous course. Ultimately, different catheter systems did allow selective catheterization and coil embolization resulting in cessation of antegrade flow in this branch. Completion arteriography demonstrates markedly reduced vascularity of the duodenum with persistent patent supply via the middle pancreaticoduodenal branch which supplies the region of the third portion of the duodenum. Additional branches supplying the region of the fourth portion of the duodenum also remained normally patent on completion. Adequate hemostasis was achieved at the femoral arteriotomy site. IMPRESSION: The region of abnormal duodenal bleeding due to areas of angiodysplasia visualized and treated by endoscopy and also marked by endoscopic clips were supplied by branches of the pancreaticoduodenal arcade off of the proximal SMA. The gastroduodenal artery did not appear to supply this region by arteriography. Two separate  pancreaticoduodenal third order arterial branches were successfully occluded with embolization coils placed via micro catheters. This resulted in decreased vascularity to the second and third portions of the duodenum. Electronically Signed   By: Aletta Edouard M.D.   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 The patient continues to have bleeding requiring transfusion and now presents for arteriography with possible embolization to treat persistent hemorrhage. EXAM: UTERINE FIBROID EMBOLIZATION ANESTHESIA/SEDATION: 1.5 Mg IV Versed; 75 mcg IV Fentanyl. Total Moderate Sedation Time: 2 hours and 20 minutes. The patient's level of consciousness and physiologic status were continuously monitored during the procedure by Radiology nursing. CONTRAST:  150 mL Isovue-300 FLUOROSCOPY TIME:  58 minutes and 18 seconds. PROCEDURE: The procedure, risks, benefits, and alternatives were explained to the patient. Questions regarding the procedure were encouraged and answered. The patient understands and consents to the procedure. A time-out was performed prior to initiating the procedure. The right groin was prepped with chlorhexidine in a sterile fashion, and a sterile drape was applied covering the operative field. A sterile gown and sterile gloves were used for the procedure. Local anesthesia was provided with 1% Lidocaine. Ultrasound was used to confirm patency of the right common femoral artery. After small skin incision, a 21 gauge needle was advanced into the right common femoral artery under direct ultrasound guidance. Ultrasound image documentation was performed. After establishing guide wire access, a 5-French sheath was placed. A 5 Fr Cobra catheter was advanced over a guidewire into the distal abdominal aorta. The catheter was then used to selectively  catheterize the superior mesenteric artery. Selective arteriography was performed. The Cobra catheter was then advanced into the celiac axis and selective arteriography performed. The Cobra catheter was then reintroduced into the superior mesenteric artery and advanced into the proximal SMA trunk. Additional arteriography was performed. A Renegade STC micro catheter was then advanced through the 5 French catheter and into a third order branch off of the superior mesenteric artery at the level of pancreaticoduodenal supply. Selective arteriography was performed. Transcatheter embolization was then performed through the micro catheter with introduction of 2 mm x 4 cm, 2 mm x 4 cm and 3 mm x 6 cm Interlock soft coils. Additional arteriography was performed through the micro catheter after embolization of this branch artery. Attempt was made to catheterize an additional third order pancreaticoduodenal  branch off of the SMA with the same catheter system. Ultimately, a 31 [place] Sos catheter was used to catheterize the SMA trunk followed by advancement of a Lantern micro catheter. After selective catheterization of a third order pancreaticoduodenal branch, selective arteriography was performed. Transcatheter embolization was then performed of this branch via the micro catheter with introduction of 2 mm x 4 cm, 2 mm x 2 cm, 2 mm x 4 cm and 3 mm x 5 cm Ruby soft coils. Additional arteriography was performed through the micro catheter after embolization. The micro catheter was further retracted and additional arteriography performed at the level of a second order trunk off of the SMA. Catheters were removed. Oblique arteriography centered over the femoral head was performed through the right femoral sheath prior to use of a closure device. Right femoral arteriotomy hemostasis: Cordis Exoseal COMPLICATIONS: None. FINDINGS: Initial first order SMA arteriography demonstrates prominent pancreaticoduodenal vessels off of the first  second order trunk of the SMA which extends to the regions of the second, third and fourth portions of the duodenum. There are two endo clips in close proximity to each other at the level of the third portion of the duodenum. Focal areas of hypervascular blush are noted in the second and third portions of the duodenum which correlate in location to the described areas of angiodysplasia by prior endoscopy. Other SMA supply is normal in appearance without evidence of additional hypervascular abnormalities or AV malformations. Supply to the rest of the small bowel and proximal colon appears normal. Additional selective celiac arteriography demonstrates normal anatomy with normally patent celiac trunk, a widely patent left gastric artery and normally patent common hepatic and splenic arteries. The gastroduodenal artery is the first major branch off of the common hepatic artery and supplies branches to the region of the duodenal bulb and distal stomach. No branches are seen extending to the region of the endo clips and no hypervascular areas in the duodenum are identified with celiac injection. For this reason, embolization was targeted to the pancreaticoduodenal arcade off of the superior mesenteric artery. The pancreaticoduodenal trunk off of the SMA demonstrates a superior branch as well as middle and inferior branches. Based on arteriography, hypervascular areas of abnormality were predominantly supplied by the superior and inferior branches. The inferior branch was initially catheterized with selective arteriography demonstrating subtle hypervascular abnormality near the 2 adjacent endoscopic clips. This vessel was successfully embolized resulting in occlusion of antegrade flow with proximal injection. The superior pancreaticoduodenal branch supplying the region of the second and third portions of the duodenum was extremely difficult to catheterize due to tortuous course. Ultimately, different catheter systems did  allow selective catheterization and coil embolization resulting in cessation of antegrade flow in this branch. Completion arteriography demonstrates markedly reduced vascularity of the duodenum with persistent patent supply via the middle pancreaticoduodenal branch which supplies the region of the third portion of the duodenum. Additional branches supplying the region of the fourth portion of the duodenum also remained normally patent on completion. Adequate hemostasis was achieved at the femoral arteriotomy site. IMPRESSION: The region of abnormal duodenal bleeding due to areas of angiodysplasia visualized and treated by endoscopy and also marked by endoscopic clips were supplied by branches of the pancreaticoduodenal arcade off of the proximal SMA. The gastroduodenal artery did not appear to supply this region by arteriography. Two separate pancreaticoduodenal third order arterial branches were successfully occluded with embolization coils placed via micro catheters. This resulted in decreased vascularity to the second and third portions  of the duodenum. Electronically Signed   By: Aletta Edouard M.D.   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 The patient continues to have bleeding requiring transfusion and now presents for arteriography with possible embolization to treat persistent hemorrhage. EXAM: UTERINE FIBROID EMBOLIZATION ANESTHESIA/SEDATION: 1.5 Mg IV Versed; 75 mcg IV Fentanyl. Total Moderate Sedation Time: 2 hours and 20 minutes. The patient's level of consciousness and physiologic status were continuously monitored during the procedure by Radiology nursing. CONTRAST:  150 mL Isovue-300 FLUOROSCOPY TIME:  58 minutes and 18 seconds. PROCEDURE: The procedure, risks, benefits, and alternatives were explained to the patient. Questions  regarding the procedure were encouraged and answered. The patient understands and consents to the procedure. A time-out was performed prior to initiating the procedure. The right groin was prepped with chlorhexidine in a sterile fashion, and a sterile drape was applied covering the operative field. A sterile gown and sterile gloves were used for the procedure. Local anesthesia was provided with 1% Lidocaine. Ultrasound was used to confirm patency of the right common femoral artery. After small skin incision, a 21 gauge needle was advanced into the right common femoral artery under direct ultrasound guidance. Ultrasound image documentation was performed. After establishing guide wire access, a 5-French sheath was placed. A 5 Fr Cobra catheter was advanced over a guidewire into the distal abdominal aorta. The catheter was then used to selectively catheterize the superior mesenteric artery. Selective arteriography was performed. The Cobra catheter was then advanced into the celiac axis and selective arteriography performed. The Cobra catheter was then reintroduced into the superior mesenteric artery and advanced into the proximal SMA trunk. Additional arteriography was performed. A Renegade STC micro catheter was then advanced through the 5 French catheter and into a third order branch off of the superior mesenteric artery at the level of pancreaticoduodenal supply. Selective arteriography was performed. Transcatheter embolization was then performed through the micro catheter with introduction of 2 mm x 4 cm, 2 mm x 4 cm and 3 mm x 6 cm Interlock soft coils. Additional arteriography was performed through the micro catheter after embolization of this branch artery. Attempt was made to catheterize an additional third order pancreaticoduodenal branch off of the SMA with the same catheter system. Ultimately, a 41 [place] Sos catheter was used to catheterize the SMA trunk followed by advancement of a Lantern micro catheter.  After selective catheterization of a third order pancreaticoduodenal branch, selective arteriography was performed. Transcatheter embolization was then performed of this branch via the micro catheter with introduction of 2 mm x 4 cm, 2 mm x 2 cm, 2 mm x 4 cm and 3 mm x 5 cm Ruby soft coils. Additional arteriography was performed through the micro catheter after embolization. The micro catheter was further retracted and additional arteriography performed at the level of a second order trunk off of the SMA. Catheters were removed. Oblique arteriography centered over the femoral head was performed through the right femoral sheath prior to use of a closure device. Right femoral arteriotomy hemostasis: Cordis Exoseal COMPLICATIONS: None. FINDINGS: Initial first order SMA arteriography demonstrates prominent pancreaticoduodenal vessels off of the first second order trunk of the SMA which extends to the regions of the second, third and fourth portions of the duodenum. There are two endo clips in close proximity to each other at the level of the third portion of  the duodenum. Focal areas of hypervascular blush are noted in the second and third portions of the duodenum which correlate in location to the described areas of angiodysplasia by prior endoscopy. Other SMA supply is normal in appearance without evidence of additional hypervascular abnormalities or AV malformations. Supply to the rest of the small bowel and proximal colon appears normal. Additional selective celiac arteriography demonstrates normal anatomy with normally patent celiac trunk, a widely patent left gastric artery and normally patent common hepatic and splenic arteries. The gastroduodenal artery is the first major branch off of the common hepatic artery and supplies branches to the region of the duodenal bulb and distal stomach. No branches are seen extending to the region of the endo clips and no hypervascular areas in the duodenum are identified with  celiac injection. For this reason, embolization was targeted to the pancreaticoduodenal arcade off of the superior mesenteric artery. The pancreaticoduodenal trunk off of the SMA demonstrates a superior branch as well as middle and inferior branches. Based on arteriography, hypervascular areas of abnormality were predominantly supplied by the superior and inferior branches. The inferior branch was initially catheterized with selective arteriography demonstrating subtle hypervascular abnormality near the 2 adjacent endoscopic clips. This vessel was successfully embolized resulting in occlusion of antegrade flow with proximal injection. The superior pancreaticoduodenal branch supplying the region of the second and third portions of the duodenum was extremely difficult to catheterize due to tortuous course. Ultimately, different catheter systems did allow selective catheterization and coil embolization resulting in cessation of antegrade flow in this branch. Completion arteriography demonstrates markedly reduced vascularity of the duodenum with persistent patent supply via the middle pancreaticoduodenal branch which supplies the region of the third portion of the duodenum. Additional branches supplying the region of the fourth portion of the duodenum also remained normally patent on completion. Adequate hemostasis was achieved at the femoral arteriotomy site. IMPRESSION: The region of abnormal duodenal bleeding due to areas of angiodysplasia visualized and treated by endoscopy and also marked by endoscopic clips were supplied by branches of the pancreaticoduodenal arcade off of the proximal SMA. The gastroduodenal artery did not appear to supply this region by arteriography. Two separate pancreaticoduodenal third order arterial branches were successfully occluded with embolization coils placed via micro catheters. This resulted in decreased vascularity to the second and third portions of the duodenum. Electronically  Signed   By: Aletta Edouard M.D.   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 The patient continues to have bleeding requiring transfusion and now presents for arteriography with possible embolization to treat persistent hemorrhage. EXAM: UTERINE FIBROID EMBOLIZATION ANESTHESIA/SEDATION: 1.5 Mg IV Versed; 75 mcg IV Fentanyl. Total Moderate Sedation Time: 2 hours and 20 minutes. The patient's level of consciousness and physiologic status were continuously monitored during the procedure by Radiology nursing. CONTRAST:  150 mL Isovue-300 FLUOROSCOPY TIME:  58 minutes and 18 seconds. PROCEDURE: The procedure, risks, benefits, and alternatives were explained to the patient. Questions regarding the procedure were encouraged and answered. The patient understands and consents to the procedure. A time-out was performed prior to initiating the procedure. The right groin was prepped with chlorhexidine in a sterile fashion, and a sterile drape was applied covering the operative field. A sterile gown and sterile gloves were used for the procedure. Local anesthesia was provided with 1% Lidocaine. Ultrasound was used to confirm patency of the right  common femoral artery. After small skin incision, a 21 gauge needle was advanced into the right common femoral artery under direct ultrasound guidance. Ultrasound image documentation was performed. After establishing guide wire access, a 5-French sheath was placed. A 5 Fr Cobra catheter was advanced over a guidewire into the distal abdominal aorta. The catheter was then used to selectively catheterize the superior mesenteric artery. Selective arteriography was performed. The Cobra catheter was then advanced into the celiac axis and selective arteriography performed. The Cobra catheter was then reintroduced into the superior mesenteric  artery and advanced into the proximal SMA trunk. Additional arteriography was performed. A Renegade STC micro catheter was then advanced through the 5 French catheter and into a third order branch off of the superior mesenteric artery at the level of pancreaticoduodenal supply. Selective arteriography was performed. Transcatheter embolization was then performed through the micro catheter with introduction of 2 mm x 4 cm, 2 mm x 4 cm and 3 mm x 6 cm Interlock soft coils. Additional arteriography was performed through the micro catheter after embolization of this branch artery. Attempt was made to catheterize an additional third order pancreaticoduodenal branch off of the SMA with the same catheter system. Ultimately, a 8 [place] Sos catheter was used to catheterize the SMA trunk followed by advancement of a Lantern micro catheter. After selective catheterization of a third order pancreaticoduodenal branch, selective arteriography was performed. Transcatheter embolization was then performed of this branch via the micro catheter with introduction of 2 mm x 4 cm, 2 mm x 2 cm, 2 mm x 4 cm and 3 mm x 5 cm Ruby soft coils. Additional arteriography was performed through the micro catheter after embolization. The micro catheter was further retracted and additional arteriography performed at the level of a second order trunk off of the SMA. Catheters were removed. Oblique arteriography centered over the femoral head was performed through the right femoral sheath prior to use of a closure device. Right femoral arteriotomy hemostasis: Cordis Exoseal COMPLICATIONS: None. FINDINGS: Initial first order SMA arteriography demonstrates prominent pancreaticoduodenal vessels off of the first second order trunk of the SMA which extends to the regions of the second, third and fourth portions of the duodenum. There are two endo clips in close proximity to each other at the level of the third portion of the duodenum. Focal areas of  hypervascular blush are noted in the second and third portions of the duodenum which correlate in location to the described areas of angiodysplasia by prior endoscopy. Other SMA supply is normal in appearance without evidence of additional hypervascular abnormalities or AV malformations. Supply to the rest of the small bowel and proximal colon appears normal. Additional selective celiac arteriography demonstrates normal anatomy with normally patent celiac trunk, a widely patent left gastric artery and normally patent common hepatic and splenic arteries. The gastroduodenal artery is the first major branch off of the common hepatic artery and supplies branches to the region of the duodenal bulb and distal stomach. No branches are seen extending to the region of the endo clips and no hypervascular areas in the duodenum are identified with celiac injection. For this reason, embolization was targeted to the pancreaticoduodenal arcade off of the superior mesenteric artery. The pancreaticoduodenal trunk off of the SMA demonstrates a superior branch as well as middle and inferior branches. Based on arteriography, hypervascular areas of abnormality were predominantly supplied by the superior and inferior branches. The inferior branch was initially catheterized with selective arteriography demonstrating subtle hypervascular abnormality near the  2 adjacent endoscopic clips. This vessel was successfully embolized resulting in occlusion of antegrade flow with proximal injection. The superior pancreaticoduodenal branch supplying the region of the second and third portions of the duodenum was extremely difficult to catheterize due to tortuous course. Ultimately, different catheter systems did allow selective catheterization and coil embolization resulting in cessation of antegrade flow in this branch. Completion arteriography demonstrates markedly reduced vascularity of the duodenum with persistent patent supply via the middle  pancreaticoduodenal branch which supplies the region of the third portion of the duodenum. Additional branches supplying the region of the fourth portion of the duodenum also remained normally patent on completion. Adequate hemostasis was achieved at the femoral arteriotomy site. IMPRESSION: The region of abnormal duodenal bleeding due to areas of angiodysplasia visualized and treated by endoscopy and also marked by endoscopic clips were supplied by branches of the pancreaticoduodenal arcade off of the proximal SMA. The gastroduodenal artery did not appear to supply this region by arteriography. Two separate pancreaticoduodenal third order arterial branches were successfully occluded with embolization coils placed via micro catheters. This resulted in decreased vascularity to the second and third portions of the duodenum. Electronically Signed   By: Aletta Edouard M.D.   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 The patient continues to have bleeding requiring transfusion and now presents for arteriography with possible embolization to treat persistent hemorrhage. EXAM: UTERINE FIBROID EMBOLIZATION ANESTHESIA/SEDATION: 1.5 Mg IV Versed; 75 mcg IV Fentanyl. Total Moderate Sedation Time: 2 hours and 20 minutes. The patient's level of consciousness and physiologic status were continuously monitored during the procedure by Radiology nursing. CONTRAST:  150 mL Isovue-300 FLUOROSCOPY TIME:  58 minutes and 18 seconds. PROCEDURE: The procedure, risks, benefits, and alternatives were explained to the patient. Questions regarding the procedure were encouraged and answered. The patient understands and consents to the procedure. A time-out was performed prior to initiating the procedure. The right groin was prepped with chlorhexidine in a sterile fashion, and a sterile drape was  applied covering the operative field. A sterile gown and sterile gloves were used for the procedure. Local anesthesia was provided with 1% Lidocaine. Ultrasound was used to confirm patency of the right common femoral artery. After small skin incision, a 21 gauge needle was advanced into the right common femoral artery under direct ultrasound guidance. Ultrasound image documentation was performed. After establishing guide wire access, a 5-French sheath was placed. A 5 Fr Cobra catheter was advanced over a guidewire into the distal abdominal aorta. The catheter was then used to selectively catheterize the superior mesenteric artery. Selective arteriography was performed. The Cobra catheter was then advanced into the celiac axis and selective arteriography performed. The Cobra catheter was then reintroduced into the superior mesenteric artery and advanced into the proximal SMA trunk. Additional arteriography was performed. A Renegade STC micro catheter was then advanced through the 5 French catheter and into a third order branch off of the superior mesenteric artery at the level of pancreaticoduodenal supply. Selective arteriography was performed. Transcatheter embolization was then performed through the micro catheter with introduction of 2 mm x 4 cm, 2 mm x 4 cm and 3 mm x 6 cm Interlock soft coils. Additional arteriography was performed through the micro catheter after embolization of this branch artery. Attempt was made to catheterize an additional third order pancreaticoduodenal branch off of the SMA with  the same catheter system. Ultimately, a 36 [place] Sos catheter was used to catheterize the SMA trunk followed by advancement of a Lantern micro catheter. After selective catheterization of a third order pancreaticoduodenal branch, selective arteriography was performed. Transcatheter embolization was then performed of this branch via the micro catheter with introduction of 2 mm x 4 cm, 2 mm x 2 cm, 2 mm x 4 cm and  3 mm x 5 cm Ruby soft coils. Additional arteriography was performed through the micro catheter after embolization. The micro catheter was further retracted and additional arteriography performed at the level of a second order trunk off of the SMA. Catheters were removed. Oblique arteriography centered over the femoral head was performed through the right femoral sheath prior to use of a closure device. Right femoral arteriotomy hemostasis: Cordis Exoseal COMPLICATIONS: None. FINDINGS: Initial first order SMA arteriography demonstrates prominent pancreaticoduodenal vessels off of the first second order trunk of the SMA which extends to the regions of the second, third and fourth portions of the duodenum. There are two endo clips in close proximity to each other at the level of the third portion of the duodenum. Focal areas of hypervascular blush are noted in the second and third portions of the duodenum which correlate in location to the described areas of angiodysplasia by prior endoscopy. Other SMA supply is normal in appearance without evidence of additional hypervascular abnormalities or AV malformations. Supply to the rest of the small bowel and proximal colon appears normal. Additional selective celiac arteriography demonstrates normal anatomy with normally patent celiac trunk, a widely patent left gastric artery and normally patent common hepatic and splenic arteries. The gastroduodenal artery is the first major branch off of the common hepatic artery and supplies branches to the region of the duodenal bulb and distal stomach. No branches are seen extending to the region of the endo clips and no hypervascular areas in the duodenum are identified with celiac injection. For this reason, embolization was targeted to the pancreaticoduodenal arcade off of the superior mesenteric artery. The pancreaticoduodenal trunk off of the SMA demonstrates a superior branch as well as middle and inferior branches. Based on  arteriography, hypervascular areas of abnormality were predominantly supplied by the superior and inferior branches. The inferior branch was initially catheterized with selective arteriography demonstrating subtle hypervascular abnormality near the 2 adjacent endoscopic clips. This vessel was successfully embolized resulting in occlusion of antegrade flow with proximal injection. The superior pancreaticoduodenal branch supplying the region of the second and third portions of the duodenum was extremely difficult to catheterize due to tortuous course. Ultimately, different catheter systems did allow selective catheterization and coil embolization resulting in cessation of antegrade flow in this branch. Completion arteriography demonstrates markedly reduced vascularity of the duodenum with persistent patent supply via the middle pancreaticoduodenal branch which supplies the region of the third portion of the duodenum. Additional branches supplying the region of the fourth portion of the duodenum also remained normally patent on completion. Adequate hemostasis was achieved at the femoral arteriotomy site. IMPRESSION: The region of abnormal duodenal bleeding due to areas of angiodysplasia visualized and treated by endoscopy and also marked by endoscopic clips were supplied by branches of the pancreaticoduodenal arcade off of the proximal SMA. The gastroduodenal artery did not appear to supply this region by arteriography. Two separate pancreaticoduodenal third order arterial branches were successfully occluded with embolization coils placed via micro catheters. This resulted in decreased vascularity to the second and third portions of the duodenum. Electronically Signed  By: Aletta Edouard M.D.   On: 04/22/2016 14:57   Dg Chest Port 1 View  04/17/2016  CLINICAL DATA:  Pain between the shoulder blades and in the chest for 2 hours. Heart monitor. EXAM: PORTABLE CHEST 1 VIEW COMPARISON:  04/09/2016 FINDINGS: Normal  heart size and pulmonary vascularity. No focal airspace disease or consolidation in the lungs. No blunting of costophrenic angles. No pneumothorax. Mediastinal contours appear intact. Degenerative changes in the spine and shoulders. Calcified and tortuous aorta. IMPRESSION: No active disease. Electronically Signed   By: Lucienne Capers M.D.   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 The patient continues to have bleeding requiring transfusion and now presents for arteriography with possible embolization to treat persistent hemorrhage. EXAM: UTERINE FIBROID EMBOLIZATION ANESTHESIA/SEDATION: 1.5 Mg IV Versed; 75 mcg IV Fentanyl. Total Moderate Sedation Time: 2 hours and 20 minutes. The patient's level of consciousness and physiologic status were continuously monitored during the procedure by Radiology nursing. CONTRAST:  150 mL Isovue-300 FLUOROSCOPY TIME:  58 minutes and 18 seconds. PROCEDURE: The procedure, risks, benefits, and alternatives were explained to the patient. Questions regarding the procedure were encouraged and answered. The patient understands and consents to the procedure. A time-out was performed prior to initiating the procedure. The right groin was prepped with chlorhexidine in a sterile fashion, and a sterile drape was applied covering the operative field. A sterile gown and sterile gloves were used for the procedure. Local anesthesia was provided with 1% Lidocaine. Ultrasound was used to confirm patency of the right common femoral artery. After small skin incision, a 21 gauge needle was advanced into the right common femoral artery under direct ultrasound guidance. Ultrasound image documentation was performed. After establishing guide wire access, a 5-French sheath was placed. A 5 Fr Cobra catheter was advanced over a  guidewire into the distal abdominal aorta. The catheter was then used to selectively catheterize the superior mesenteric artery. Selective arteriography was performed. The Cobra catheter was then advanced into the celiac axis and selective arteriography performed. The Cobra catheter was then reintroduced into the superior mesenteric artery and advanced into the proximal SMA trunk. Additional arteriography was performed. A Renegade STC micro catheter was then advanced through the 5 French catheter and into a third order branch off of the superior mesenteric artery at the level of pancreaticoduodenal supply. Selective arteriography was performed. Transcatheter embolization was then performed through the micro catheter with introduction of 2 mm x 4 cm, 2 mm x 4 cm and 3 mm x 6 cm Interlock soft coils. Additional arteriography was performed through the micro catheter after embolization of this branch artery. Attempt was made to catheterize an additional third order pancreaticoduodenal branch off of the SMA with the same catheter system. Ultimately, a 28 [place] Sos catheter was used to catheterize the SMA trunk followed by advancement of a Lantern micro catheter. After selective catheterization of a third order pancreaticoduodenal branch, selective arteriography was performed. Transcatheter embolization was then performed of this branch via the micro catheter with introduction of 2 mm x 4 cm, 2 mm x 2 cm, 2 mm x 4 cm and 3 mm x 5 cm Ruby soft coils. Additional arteriography was performed through the micro catheter after embolization. The micro catheter was further retracted and additional arteriography performed at the level of a second order trunk  off of the SMA. Catheters were removed. Oblique arteriography centered over the femoral head was performed through the right femoral sheath prior to use of a closure device. Right femoral arteriotomy hemostasis: Cordis Exoseal COMPLICATIONS: None. FINDINGS: Initial first order  SMA arteriography demonstrates prominent pancreaticoduodenal vessels off of the first second order trunk of the SMA which extends to the regions of the second, third and fourth portions of the duodenum. There are two endo clips in close proximity to each other at the level of the third portion of the duodenum. Focal areas of hypervascular blush are noted in the second and third portions of the duodenum which correlate in location to the described areas of angiodysplasia by prior endoscopy. Other SMA supply is normal in appearance without evidence of additional hypervascular abnormalities or AV malformations. Supply to the rest of the small bowel and proximal colon appears normal. Additional selective celiac arteriography demonstrates normal anatomy with normally patent celiac trunk, a widely patent left gastric artery and normally patent common hepatic and splenic arteries. The gastroduodenal artery is the first major branch off of the common hepatic artery and supplies branches to the region of the duodenal bulb and distal stomach. No branches are seen extending to the region of the endo clips and no hypervascular areas in the duodenum are identified with celiac injection. For this reason, embolization was targeted to the pancreaticoduodenal arcade off of the superior mesenteric artery. The pancreaticoduodenal trunk off of the SMA demonstrates a superior branch as well as middle and inferior branches. Based on arteriography, hypervascular areas of abnormality were predominantly supplied by the superior and inferior branches. The inferior branch was initially catheterized with selective arteriography demonstrating subtle hypervascular abnormality near the 2 adjacent endoscopic clips. This vessel was successfully embolized resulting in occlusion of antegrade flow with proximal injection. The superior pancreaticoduodenal branch supplying the region of the second and third portions of the duodenum was extremely  difficult to catheterize due to tortuous course. Ultimately, different catheter systems did allow selective catheterization and coil embolization resulting in cessation of antegrade flow in this branch. Completion arteriography demonstrates markedly reduced vascularity of the duodenum with persistent patent supply via the middle pancreaticoduodenal branch which supplies the region of the third portion of the duodenum. Additional branches supplying the region of the fourth portion of the duodenum also remained normally patent on completion. Adequate hemostasis was achieved at the femoral arteriotomy site. IMPRESSION: The region of abnormal duodenal bleeding due to areas of angiodysplasia visualized and treated by endoscopy and also marked by endoscopic clips were supplied by branches of the pancreaticoduodenal arcade off of the proximal SMA. The gastroduodenal artery did not appear to supply this region by arteriography. Two separate pancreaticoduodenal third order arterial branches were successfully occluded with embolization coils placed via micro catheters. This resulted in decreased vascularity to the second and third portions of the duodenum. Electronically Signed   By: Aletta Edouard M.D.   On: 04/22/2016 14:57    Labs:  CBC:  Recent Labs  04/22/16 0340  04/23/16 0540 04/23/16 1623 04/24/16 0224 04/24/16 1342 04/25/16 0340 04/25/16 0644  WBC 6.8  --  7.7 8.9 6.9  --   --   --   HGB 7.7*  < > 8.3* 8.8* 7.4* 7.9* 6.7* 6.6*  HCT 24.3*  < > 25.6* 27.3* 23.4* 24.8* 21.4* 20.6*  PLT 157  --  144* 164 151  --   --   --   < > = values in this interval not  displayed.  COAGS:  Recent Labs  04/09/16 1831 04/16/16 2324 04/21/16 0911 04/22/16 0340  INR 1.25 1.22 1.15 1.12    BMP:  Recent Labs  04/17/16 0901 04/21/16 1007 04/22/16 0340 04/23/16 0540  NA 139 139 139 137  K 4.5 4.8 4.4 4.5  CL 112* 108 107 105  CO2 17* 24 21* 24  GLUCOSE 88 94 115* 91  BUN 29* 28* 26* 24*    CALCIUM 7.9* 8.1* 8.0* 7.8*  CREATININE 0.92 0.90 0.96 0.75  GFRNONAA 57* 59* 54* >60  GFRAA >60 >60 >60 >60    LIVER FUNCTION TESTS:  Recent Labs  10/05/15 1504 04/09/16 1646 04/16/16 2324 04/22/16 0340  BILITOT 0.3 0.2* 0.2* 0.5  AST 14 17 21 16   ALT 10 11* 15 12*  ALKPHOS 52 39 39 36*  PROT 6.6 5.5* 5.3* 4.6*  ALBUMIN 3.9 3.1* 2.9* 2.3*    TUMOR MARKERS: No results for input(s): AFPTM, CEA, CA199, CHROMGRNA in the last 8760 hours.  Assessment and Plan:  GI Bleed Post pancreaticoduodenal artery embolization 04/22/16 Stable few days Now with new bleed Scheduled for mesenteric arteriogram with poss embolization Risks and Benefits discussed with the patient including, but not limited to bleeding, infection, vascular injury or contrast induced renal failure. All of the patient's questions were answered, patient is agreeable to proceed. Consent signed and in chart.  Thank you for this interesting consult.  I greatly enjoyed meeting Nicole Perez and look forward to participating in their care.  A copy of this report was sent to the requesting provider on this date.  Electronically Signed: Riccardo Holeman A 04/25/2016, 8:49 AM   I spent a total of 40 Minutes    in face to face in clinical consultation, greater than 50% of which was counseling/coordinating care for mesenteric arteriogram/poss embo

## 2016-04-25 NOTE — Progress Notes (Signed)
Physical Therapy Treatment Patient Details Name: Nicole Perez MRN: [id number] DOB: [birth year] Today's Date: 04/25/2016    History of Present Illness Nicole Perez is a 82 y.o. female with past medical history as listed below who was recently admitted on 4/25 with chest pain, dyspnea, weakness and anemia as well as dark stools. Subsequent endoscopy revealed angiodysplastic lesions in the duodenum which were treated with APC. Patient was subsequently discharged on 4/27 and readmitted again on 5/1 with recurrent GI bleeding/melena. Endoscopy on 5/3 revealed nonbleeding duodenal ulcer which was treated with APC/clipping. Nuclear medicine scan on 5/5 revealed trace/faint amount of activity suggestive of a small, intermittent bleed in the proximal small bowel region. Request now received from GI for mesenteric/visceral arteriography with possible embolization.    PT Comments    Patient able to tolerate ambulation after first unit of blood. Continues to require education for safe use of RW during transfers. Once walking she did well with safe use of RW. Provided with exercise handout.  Follow Up Recommendations  Home health PT ([facility], [facility])     Equipment Recommendations  3in1 (PT)    Recommendations for Other Services       Precautions / Restrictions Precautions Precautions: Fall Restrictions Weight Bearing Restrictions: No    Mobility  Bed Mobility Overal bed mobility: Modified Independent             General bed mobility comments: increased time to perform, no physical assist required  Transfers Overall transfer level: Needs assistance Equipment used: Rolling walker (2 wheeled) Transfers: Sit to/from Stand Sit to Stand: Min guard         General transfer comment: x 5 reps throughout session; vc for technique with RW; min guard for stability, increased time to perform.   Ambulation/Gait Ambulation/Gait assistance: Supervision Ambulation Distance (Feet): 200  Feet Assistive device: Rolling walker (2 wheeled) Gait Pattern/deviations: Step-through pattern;Decreased stride length Gait velocity: decreased   General Gait Details: supervision due to low Hgb (had received 1 of 2 units); asymptomatic   Stairs            Wheelchair Mobility    Modified Rankin (Stroke Patients Only)       Balance           Standing balance support: No upper extremity supported;During functional activity Standing balance-Leahy Scale: Good Standing balance comment: able to bend reach down to initiate removing brief (sat to complete) and to pull clean brief up using bil UEs                    Cognition Arousal/Alertness: Awake/alert Behavior During Therapy: WFL for tasks assessed/performed Overall Cognitive Status: Within Functional Limits for tasks assessed                      Exercises Other Exercises Other Exercises: provided general strengthening exercise sheet with bed level and chair level exercises (instructed pt not to do the standing exercises alone). Pt familiar with all exercises from prior PT    General Comments        Pertinent Vitals/Pain Pain Assessment: Faces Faces Pain Scale: Hurts even more Pain Location: bil feet parasthesias Pain Descriptors / Indicators: Burning Pain Intervention(s): Limited activity within patient's tolerance;Monitored during session;Repositioned    Home Living                      Prior Function            PT Goals (  current goals can now be found in the care plan section) Acute Rehab PT Goals Patient Stated Goal: to go home Time For Goal Achievement: 05/05/16 Progress towards PT goals: Progressing toward goals    Frequency  Min 3X/week    PT Plan Current plan remains appropriate    Co-evaluation             End of Session Equipment Utilized During Treatment: Gait belt Activity Tolerance: Patient tolerated treatment well Patient left: with call bell/phone  within reach;in bed;with family/visitor present;with nursing/sitter in room     Time: [id number] PT Time Calculation (min) (ACUTE ONLY): 26 min  Charges:  $Gait Training: 8-22 mins $Therapeutic Activity: 8-22 mins                    G Codes:      Nicole Perez 05/22/16, 12:00 PM  Pager [phone]

## 2016-04-25 NOTE — Progress Notes (Signed)
Patient voiced concern that erythromycin ointment was irritating her eyes, as she is noticing swelling in undereye after administration. Did not administer 6PM dose of medication. Will pass on to night shift to address this and check that the patient still needs to take this medication. She has been on this medication for duration of hospital stay- 10 days.   Milford Cage, RN

## 2016-04-25 NOTE — Progress Notes (Signed)
Family Medicine Teaching Service Daily Progress Note Intern Pager: [phone]  Patient name: Nicole Perez Medical record number: [id number] Date of birth: 1933-10-29 Age: 82 y.o. Gender: female  Primary Care Provider: MCDIARMID,TODD D, MD Consultants: GI, Cardiology, and IR  Code Status: DNR  Pt Overview and Major Events to Date:  5/2 admitted with symptomatic anemia in the setting of GI bleed and received 2 units 5/3 push enterescopy 5/4 transfused 1 unit pRBCs 5/5 tagged RBC scan 5/6 transfused 1 unit pRBCs 5/7 embolization of pancreaticoduodenal arteries by IR 5/7 transfused 1u pRBC 5/10 transfused 1u pRBC  Assessment and Plan: Nicole Perez is a 82 y.o. female presenting with symptomatic anemia. PMH is significant for recent hospitalization for anemia/ GI bleed/ Angiodysplasia, hypertension, neurogenic bladder and OA  Symptomatic anemia 2/2 GI bleed: Hgb continues to drop: 5.5 (admission)>4u>7.2>1 u >8.3>>6.7>1u. 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 Push enteroscopy on 5/3 revealed one non-bleeding duodenal ulcer treated with argon plasma coagulation and clip placement. Bleeding scan on 5/5 showed a bleed that is favored to be from the proximal small bowel, potentially secondary to patient's known duodenal AVM. S/p embolization of pancreaticoduodenal arteries on 5/7. INR 1.1 on 5/10. - Appreciate GI recs -Appreciate surgery recs:   -IR for possible re-embolization. Ex Lap as last option - IR to do mesenteric Angio and possible embolization today  - Transfuse 2 units. Will follow H&H - Protonix 40 twice a day - Carafate 1 gm three times a day   - Telemetry - VS per floor protocol  Epigastric Abdominal Pain: continues to endorse epigastric pain. TTP over epigastric areas. - Continue PPI and Carafate as above  Chest pain: None. EKG with ST depression on admission likely demand ischemia in the setting of anemia. Troponin negative x2  - Appreciate card recs, Nuclear scan as  outpt - will avoid ASA in the setting of current GI bleed  HTN: Holding for now due to blood pressures. lisinopril and HCTZ at home - Holding home meds - Monitor BP  Bradycardia: HR in 40's & 50's. Has history of junctional rhythm. On 30 day monitor before admission. -will continue to monitor  Neurogenic bladder/UTI: self caths at home. S/p CTX 5/2>5/4 and Keflex 5/4>5/8 - I &O cath as needed   Osteoarthritis: Foot and leg pain. History of back & LE OA. On Lorcet at home - Normangee 10/325 mg twice a day  - Avoid NSAIDs in the setting of GI bleed - Bowel regimen (miralax and MOM)  FEN/GI:  -NPO for angiography  Prophylaxis: SCDs in the setting of GI bleeding  Disposition: SDU   Subjective:  Sad that she continued to bleed. Endorses epigastric pain. Had BM yesterday which was black. No bright red blood in it. Leg pain is better this morning.  Objective: Temp:  [98 F (36.7 C)-98.5 F (36.9 C)] 98.4 F (36.9 C) (05/10 0325) Pulse Rate:  [46-63] 53 (05/10 0325) Resp:  [15-21] 15 (05/10 0325) BP: (103-126)/(38-66) 103/50 mmHg (05/10 0325) SpO2:  [97 %-100 %] 99 % (05/10 0325) Physical Exam: GEN: lying in bed, appears well, NAD CVS: bradycardia to 56,  normal s1 and s2, no murmurs, no edema RESP: normal work of breathing, no crackles or wheeze GI: normal bowel sound, soft, + tender to palpation over epigastric area, non-distended NEURO: alert and oriented x4, no gross defecits   Laboratory:  Recent Labs Lab 04/23/16 0540 04/23/16 1623 04/24/16 0224 04/24/16 1342 04/25/16 0340  WBC 7.7 8.9 6.9  --   --  HGB 8.3* 8.8* 7.4* 7.9* 6.7*  HCT 25.6* 27.3* 23.4* 24.8* 21.4*  PLT 144* 164 151  --   --     Recent Labs Lab 04/21/16 1007 04/22/16 0340 04/23/16 0540  NA 139 139 137  K 4.8 4.4 4.5  CL 108 107 105  CO2 24 21* 24  BUN 28* 26* 24*  CREATININE 0.90 0.96 0.75  CALCIUM 8.1* 8.0* 7.8*  PROT  --  4.6*  --   BILITOT  --  0.5  --   ALKPHOS  --  36*  --    ALT  --  12*  --   AST  --  16  --   GLUCOSE 94 115* 91    Mercy Riding, MD 04/25/2016, 4:50 AM PGY-1, [place] Intern pager: [phone], text pages welcome

## 2016-04-25 NOTE — Progress Notes (Signed)
7 Days Post-Op  Subjective: Noted to have stable hemoglobin yesterday afternoon, however, decreased again this morning on recheck.  Endorses mild abdominal discomfort, but states it is the same as yesterday.  Denies any further bloody bowel movements.  No nausea or emesis.  Episodes of bradycardia noted overnight.  Objective: Vital signs in last 24 hours: Temp:  [98 F (36.7 C)-98.5 F (36.9 C)] 98.4 F (36.9 C) (05/10 0325) Pulse Rate:  [46-63] 53 (05/10 0325) Resp:  [15-21] 15 (05/10 0325) BP: (103-120)/(38-66) 103/50 mmHg (05/10 0325) SpO2:  [97 %-100 %] 99 % (05/10 0325) Last BM Date: 04/24/16  Intake/Output from previous day: 05/09 0701 - 05/10 0700 In: 843 [P.O.:840; I.V.:3] Out: 850 [Urine:850] Intake/Output this shift:    General appearance: alert, cooperative, appears stated age and no distress Cardio: Bradycardic, regular GI: Soft, non tender, nondistended.    Lab Results:   Recent Labs  04/23/16 1623 04/24/16 0224  04/25/16 0340 04/25/16 0644  WBC 8.9 6.9  --   --   --   HGB 8.8* 7.4*  < > 6.7* 6.6*  HCT 27.3* 23.4*  < > 21.4* 20.6*  PLT 164 151  --   --   --   < > = values in this interval not displayed. BMET  Recent Labs  04/23/16 0540  NA 137  K 4.5  CL 105  CO2 24  GLUCOSE 91  BUN 24*  CREATININE 0.75  CALCIUM 7.8*   PT/INR No results for input(s): LABPROT, INR in the last 72 hours. ABG No results for input(s): PHART, HCO3 in the last 72 hours.  Invalid input(s): PCO2, PO2  Studies/Results: No results found.  Anti-infectives: Anti-infectives    Start     Dose/Rate Route Frequency Ordered Stop   04/19/16 1400  cephALEXin (KEFLEX) capsule 500 mg  Status:  Discontinued     500 mg Oral Every 8 hours 04/19/16 0954 04/24/16 0820   04/17/16 0700  cefTRIAXone (ROCEPHIN) 1 g in dextrose 5 % 50 mL IVPB  Status:  Discontinued     1 g 100 mL/hr over 30 Minutes Intravenous Every 24 hours 04/17/16 0658 04/19/16 0954       Assessment/Plan: s/p Procedure(s): ENTEROSCOPY (N/A) 82 yo F with GI bleed  She represents a challenging case and would likely have a high surgical risk if surgery is needed.  At this point, she has undergone EGD with coag and IR with embolization.  Hgb has decreased today to 6.7.  Would recommend transfusion of pRBCs and recheck of hgb.  Additionally, will place order for IR embolization and would suggest re-engaging IR for possible angio to evaluate for recurrent bleed.  Patient made NPO in anticipation of embolization.  LOS: 8 days    Reino Kent 04/25/2016

## 2016-04-25 NOTE — Progress Notes (Signed)
CRITICAL VALUE ALERT  Critical value received:  Hemoglobin 6.7  Date of notification:  04/25/16  Time of notification:  0425  Critical value read back:YES  Nurse who received alert:  Sherryl Manges, RN, BSN  MD notified (1st page): Cyndia Skeeters, MD  Time of first page:  [phone]  MD notified (2nd page):  Time of second page:  Responding MD:    Time MD responded:

## 2016-04-26 LAB — HEMOGLOBIN AND HEMATOCRIT, BLOOD
HEMATOCRIT: 24.7 % — AB (ref 36.0–46.0)
HEMATOCRIT: 26 % — AB (ref 36.0–46.0)
HEMOGLOBIN: 8.1 g/dL — AB (ref 12.0–15.0)
Hemoglobin: 8 g/dL — ABNORMAL LOW (ref 12.0–15.0)

## 2016-04-26 NOTE — Progress Notes (Signed)
8 Days Post-Op  Subjective: NAE overnight.  Documented is a single episode of tachycardia, but patient does not recall this event.  Denies any complaints this morning.  No further bowel movements, last BM 2 days ago.  Tolerating diet well.  Objective: Vital signs in last 24 hours: Temp:  [98.2 F (36.8 C)-98.7 F (37.1 C)] 98.3 F (36.8 C) (05/11 0453) Pulse Rate:  [46-103] 49 (05/11 0453) Resp:  [14-21] 14 (05/11 0453) BP: (104-147)/(42-61) 115/48 mmHg (05/11 0453) SpO2:  [97 %-100 %] 100 % (05/11 0453) Last BM Date: 04/24/16  Intake/Output from previous day: 05/10 0701 - 05/11 0700 In: 704.2 [P.O.:360; I.V.:9.2; Blood:335] Out: 1250 [Urine:1250] Intake/Output this shift:    General appearance: alert, cooperative and no distress GI: Sot, Nontender, nondistended.  No rebound or guarding.    Lab Results:   Recent Labs  04/23/16 1623 04/24/16 0224  04/25/16 1919 04/26/16 0414  WBC 8.9 6.9  --   --   --   HGB 8.8* 7.4*  < > 9.1* 8.0*  HCT 27.3* 23.4*  < > 28.3* 24.7*  PLT 164 151  --   --   --   < > = values in this interval not displayed. BMET  Recent Labs  04/25/16 0822  NA 138  K 4.3  CL 105  CO2 26  GLUCOSE 105*  BUN 25*  CREATININE 0.96  CALCIUM 7.9*   PT/INR  Recent Labs  04/25/16 0822  LABPROT 14.4  INR 1.10   ABG No results for input(s): PHART, HCO3 in the last 72 hours.  Invalid input(s): PCO2, PO2  Studies/Results: No results found.  Anti-infectives: Anti-infectives    Start     Dose/Rate Route Frequency Ordered Stop   04/19/16 1400  cephALEXin (KEFLEX) capsule 500 mg  Status:  Discontinued     500 mg Oral Every 8 hours 04/19/16 0954 04/24/16 0820   04/17/16 0700  cefTRIAXone (ROCEPHIN) 1 g in dextrose 5 % 50 mL IVPB  Status:  Discontinued     1 g 100 mL/hr over 30 Minutes Intravenous Every 24 hours 04/17/16 0658 04/19/16 0954      Assessment/Plan: s/p Procedure(s): ENTEROSCOPY (N/A) Hgb yesterday following transfusion of 1u  was 9.1 from 6.6.  This > expected.  This morning down to 8, which would be a more appropriate repsonse to the transfusion.  Will continue to monitor, but, I do not think she is currently bleeding.  Discussion with IR held yesterday and both agree, IR at this time though with it's own risks is likely next step before surgery should she bleed again.  LOS: 9 days    Reino Kent 04/26/2016

## 2016-04-26 NOTE — Progress Notes (Signed)
Report received from [place], [place] for transfer to [phone]

## 2016-04-26 NOTE — Progress Notes (Signed)
NURSING PROGRESS NOTE  Nicole Perez [id number] Transfer Data: 04/26/2016 4:23 PM Attending Provider: Leeanne Rio, MD [id number] D, MD Code Status: DNR  Nicole Perez is a 82 y.o. female  -No acute distress noted.  -No complaints of shortness of breath.  -No complaints of chest pain.   Cardiac Monitoring: Box # 10 in place.   Blood pressure 109/47, pulse 51, temperature 99.1 F (37.3 C), temperature source Oral, resp. rate 19, height 4\' 11"  (1.499 m), weight 67.2 kg (148 lb 2.4 oz), last menstrual period 12/17/1962, SpO2 100 %.    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 2016); H. pylori infection (2016); Hiatal hernia (05/05/2015); Urethral polyp (11/17/2015); Melena (03/2016); Overflow incontinence (123XX123); and Complicated UTI (urinary tract infection) (01/30/2016).  Past Surgical History:   has past surgical history that includes Total abdominal hysterectomy w/ bilateral salpingoophorectomy (1964); Bladder suspension; Cystocele repair; Rectocele repair; Urethral dilation;  Parotid gland tumor excision (1990); Reconstruction of nose (1994); Carpal tunnel release (2010); Cataract extraction w/ intraocular lens implant (2010); Breast lumpectomy; Laminectomy; Colonoscopy w/ polypectomy (2006); Parotidectomy (08/30/11); Carpal tunnel release; Esophagogastroduodenoscopy (N/A, 04/10/2016); and enteroscopy (N/A, 04/18/2016).  Social History:   reports that she quit smoking about 32 years ago. She has quit using smokeless tobacco. She reports that she does not drink alcohol or use illicit drugs.  Skin: intact  Patient/Family orientated to room. Information packet given to patient/family. Admission inpatient armband information verified with patient/family to include name and date of birth and placed on patient arm. Side rails up x 2, fall assessment and education completed with patient/family. Patient/family able to verbalize understanding of risk associated with falls and verbalized understanding to call for assistance before getting out of bed. Call light within reach. Patient/family able to voice and demonstrate understanding of unit orientation instructions.    Will continue to evaluate and treat per MD orders.

## 2016-04-26 NOTE — Progress Notes (Signed)
Family Medicine Teaching Service Daily Progress Note Intern Pager: [phone]  Patient name: Nicole Perez Medical record number: [id number] Date of birth: Jan 30, 1933 Age: 82 y.o. Gender: female  Primary Care Provider: MCDIARMID,TODD D, MD Consultants: GI, Cardiology, and IR  Code Status: DNR  Pt Overview and Major Events to Date:  5/2 admitted with symptomatic anemia in the setting of GI bleed and received 2 units 5/3 push enterescopy 5/4 transfused 1 unit pRBCs 5/5 tagged RBC scan 5/6 transfused 1 unit pRBCs 5/7 embolization of pancreaticoduodenal arteries by IR 5/7 transfused 1u pRBC 5/10 transfused 2u pRBC  Assessment and Plan: Nicole Perez is a 82 y.o. female presenting with symptomatic anemia. PMH is significant for recent hospitalization for anemia/ GI bleed/ Angiodysplasia, hypertension, neurogenic bladder and OA  Symptomatic anemia 2/2 GI bleed: Hgb continues to drop: 5.5 (admission)>4u>7.2>1 u >8.3>>6.7>2u>>8. 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 Push enteroscopy on 5/3 revealed one non-bleeding duodenal ulcer treated with argon plasma coagulation and clip placement. Bleeding scan on 5/5 showed a bleed that is favored to be from the proximal small bowel, potentially secondary to patient's known duodenal AVM. S/p embolization of pancreaticoduodenal arteries on 5/7. INR 1.1 on 5/10. - Appreciate GI recs and surgery recs - Follow H&H this afternoon. If Hgb drops, call IR for possible reimbolization - Protonix 40 twice a day - Carafate 1 gm three times a day   - VS per floor protocol - Discontinue tele  Epigastric Abdominal Pain: improved with Maalox. Mild TTP over epigastric areas. - Continue PPI and Carafate as above  Chest pain: resolved.  ACS ruled out with serial trop and EKG  - Appreciate card recs, Nuclear scan as outpt - will avoid ASA in the setting of current GI bleed  HTN: Holding for now due to blood pressures. lisinopril and HCTZ at home - Holding home  meds - Monitor BP  Bradycardia: HR in 40's & 50's. Has history of junctional rhythm. On 30 day monitor before admission. -will continue to monitor  Neurogenic bladder/UTI: self caths at home. S/p CTX 5/2>5/4 and Keflex 5/4>5/8 - I &O cath as needed   Osteoarthritis: Foot and leg pain. History of back & LE OA. On Lorcet at home - [place] 10/325 mg twice a day  - Avoid NSAIDs in the setting of GI bleed - Bowel regimen (miralax and MOM)  FEN/GI:  -Regular diet  Prophylaxis: SCDs in the setting of GI bleeding  Disposition: Transfer to regular floor today  Subjective:  Says she is better. She had a good night. She walked with PT yesterday which went well. Epigastric pain improved with Maalox. She didn't have BM yesterday.   Objective: Temp:  [98.2 F (36.8 C)-98.7 F (37.1 C)] 98.3 F (36.8 C) (05/11 0453) Pulse Rate:  [46-103] 49 (05/11 0453) Resp:  [14-21] 14 (05/11 0453) BP: (104-147)/(42-61) 115/48 mmHg (05/11 0453) SpO2:  [97 %-100 %] 100 % (05/11 0453) Physical Exam: GEN: lying in bed, appears well, NAD CVS: HR 60 on monitor, normal s1 and s2, no murmurs, no edema RESP: normal work of breathing, no crackles or wheeze GI: normal bowel sound, soft, + tender to palpation over epigastric area, non-distended NEURO: alert and oriented, no gross defecits   Laboratory:  Recent Labs Lab 04/23/16 0540 04/23/16 1623 04/24/16 0224  04/25/16 0644 04/25/16 1919 04/26/16 0414  WBC 7.7 8.9 6.9  --   --   --   --   HGB 8.3* 8.8* 7.4*  < >  6.6* 9.1* 8.0*  HCT 25.6* 27.3* 23.4*  < > 20.6* 28.3* 24.7*  PLT 144* 164 151  --   --   --   --   < > = values in this interval not displayed.  Recent Labs Lab 04/22/16 0340 04/23/16 0540 04/25/16 0822  NA 139 137 138  K 4.4 4.5 4.3  CL 107 105 105  CO2 21* 24 26  BUN 26* 24* 25*  CREATININE 0.96 0.75 0.96  CALCIUM 8.0* 7.8* 7.9*  PROT 4.6*  --   --   BILITOT 0.5  --   --   ALKPHOS 36*  --   --   ALT 12*  --   --   AST  16  --   --   GLUCOSE 115* 91 105*    Mercy Riding, MD 04/26/2016, 7:23 AM PGY-1, [place] Intern pager: [phone], text pages welcome

## 2016-04-27 HISTORY — PX: OTHER SURGICAL HISTORY: SHX169

## 2016-04-27 LAB — CBC
HEMATOCRIT: 22.5 % — AB (ref 36.0–46.0)
Hemoglobin: 7.1 g/dL — ABNORMAL LOW (ref 12.0–15.0)
MCH: 27.8 pg (ref 26.0–34.0)
MCHC: 31.6 g/dL (ref 30.0–36.0)
MCV: 88.2 fL (ref 78.0–100.0)
Platelets: 195 10*3/uL (ref 150–400)
RBC: 2.55 MIL/uL — AB (ref 3.87–5.11)
RDW: 16.3 % — ABNORMAL HIGH (ref 11.5–15.5)
WBC: 4.9 10*3/uL (ref 4.0–10.5)

## 2016-04-27 LAB — HEMOGLOBIN AND HEMATOCRIT, BLOOD
HCT: 23.7 % — ABNORMAL LOW (ref 36.0–46.0)
HEMOGLOBIN: 7.4 g/dL — AB (ref 12.0–15.0)

## 2016-04-27 LAB — BASIC METABOLIC PANEL
Anion gap: 11 (ref 5–15)
BUN: 24 mg/dL — AB (ref 6–20)
CALCIUM: 8.2 mg/dL — AB (ref 8.9–10.3)
CHLORIDE: 106 mmol/L (ref 101–111)
CO2: 24 mmol/L (ref 22–32)
CREATININE: 0.84 mg/dL (ref 0.44–1.00)
Glucose, Bld: 91 mg/dL (ref 65–99)
POTASSIUM: 4.2 mmol/L (ref 3.5–5.1)
SODIUM: 141 mmol/L (ref 135–145)

## 2016-04-27 MED ORDER — MAGNESIUM HYDROXIDE 400 MG/5ML PO SUSP
30.0000 mL | ORAL | Status: AC
  Administered 2016-04-27 (×2): 30 mL via ORAL
  Filled 2016-04-27 (×2): qty 30

## 2016-04-27 NOTE — Progress Notes (Signed)
Family Medicine Teaching Service Daily Progress Note Intern Pager: [phone]  Patient name: Nicole Perez Medical record number: [id number] Date of birth: 1933-10-14 Age: 82 y.o. Gender: female  Primary Care Provider: MCDIARMID,TODD D, MD Consultants: GI, Cardiology, and IR  Code Status: DNR  Pt Overview and Major Events to Date:  5/2 admitted with symptomatic anemia in the setting of GI bleed and received 2 units 5/3 push enterescopy 5/4 transfused 1 unit pRBCs 5/5 tagged RBC scan 5/6 transfused 1 unit pRBCs 5/7 embolization of pancreaticoduodenal arteries by IR 5/7 transfused 1u pRBC 5/10 transfused 2u pRBC  Assessment and Plan: Nicole Perez is a 82 y.o. female presenting with symptomatic anemia. PMH is significant for recent hospitalization for anemia/ GI bleed/ Angiodysplasia, hypertension, neurogenic bladder and OA  Symptomatic anemia 2/2 GI bleed: Continued to drop Hgb. Hgb 5.5 on admission>>7u>>8>7.1. 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 Push enteroscopy on 5/3 revealed one non-bleeding duodenal ulcer treated with argon plasma coagulation and clip placement. Bleeding scan on 5/5 showed a bleed that is favored to be from the proximal small bowel, potentially secondary to patient's known duodenal AVM. S/p embolization of pancreaticoduodenal arteries on 5/7.  - Hgb dropped to 7.1 this morning. Called IR who recommended ordering IR radiologist eval and mgt, and GI consult again. - Appreciate GI recs and surgery recs - H&H in at 2 pm - Protonix 40 twice a day - Carafate 1 gm three times a day  - VS per floor protocol  Epigastric Abdominal Pain: improved. Mild TTP over epigastric areas. - Continue PPI and Carafate as above  Chest pain: resolved. ACS ruled out with serial trop and EKG  - Appreciate card recs, Nuclear scan as outpt - will avoid ASA in the setting of current GI bleed  HTN: Holding for now due to blood pressures. lisinopril and HCTZ at home - Holding home  meds - Monitor BP  Bradycardia: HR in 60's this morning. Had history of junctional rhythm. On 30 day monitor before admission. -will continue to monitor  Neurogenic bladder/UTI: self caths at home. S/p CTX 5/2>5/4 and Keflex 5/4>5/8 - I &O cath as needed   Osteoarthritis: Foot and leg pain. History of back & LE OA. On Lorcet at home - [place] 10/325 mg twice a day  - Avoid NSAIDs in the setting of GI bleed - Bowel regimen (miralax and MOM)  FEN/GI:  -Regular diet  Prophylaxis: SCDs in the setting of GI bleeding  Disposition: continue regular floor until Hgb is stable for discharge. IR to evaluate patient again  Subjective:  Reports having good night. No epigastric pain. She had melanotic stool yesterday. Denies hematochezia.   Objective: Temp:  [97.5 F (36.4 C)-99.1 F (37.3 C)] 97.5 F (36.4 C) (05/12 0532) Pulse Rate:  [45-68] 68 (05/12 0532) Resp:  [15-20] 18 (05/12 0532) BP: (95-113)/(46-52) 113/52 mmHg (05/12 0532) SpO2:  [97 %-100 %] 98 % (05/12 0532) Weight:  [150 lb (68.04 kg)-150 lb 5.7 oz (68.2 kg)] 150 lb 5.7 oz (68.2 kg) (05/12 0532) Physical Exam: GEN: sitting in bed and eating breakfast, appears well, NAD CVS: RRR, normal s1 and s2, no murmurs, no edema RESP: normal work of breathing, no crackles or wheeze GI: normal bowel sound, soft, + tender to palpation over epigastric area, non-distended NEURO: alert and oriented, no gross defecits  Laboratory:  Recent Labs Lab 04/23/16 0540 04/23/16 1623 04/24/16 0224  04/25/16 1919 04/26/16 0414 04/26/16 1512  WBC 7.7 8.9 6.9  --   --   --   --  HGB 8.3* 8.8* 7.4*  < > 9.1* 8.0* 8.1*  HCT 25.6* 27.3* 23.4*  < > 28.3* 24.7* 26.0*  PLT 144* 164 151  --   --   --   --   < > = values in this interval not displayed.  Recent Labs Lab 04/22/16 0340 04/23/16 0540 04/25/16 0822  NA 139 137 138  K 4.4 4.5 4.3  CL 107 105 105  CO2 21* 24 26  BUN 26* 24* 25*  CREATININE 0.96 0.75 0.96  CALCIUM 8.0*  7.8* 7.9*  PROT 4.6*  --   --   BILITOT 0.5  --   --   ALKPHOS 36*  --   --   ALT 12*  --   --   AST 16  --   --   GLUCOSE 115* 91 105*    Imaging/Diagnostic Tests: No results found.  Mercy Riding, MD 04/27/2016, 7:11 AM PGY-1, [place] Intern pager: [phone], text pages welcome

## 2016-04-27 NOTE — Progress Notes (Signed)
[facility] Progress Note  9 Days Post-Op  Subjective: Pt doing well, no N/V, tolerating soft diet.  Hopefull she won't re-bleed.  Passing flatus, urinating well.    Objective: Vital signs in last 24 hours: Temp:  [97.5 F (36.4 C)-99.1 F (37.3 C)] 97.5 F (36.4 C) (05/12 0532) Pulse Rate:  [51-68] 68 (05/12 0532) Resp:  [18-20] 18 (05/12 0532) BP: (95-113)/(47-52) 113/52 mmHg (05/12 0532) SpO2:  [97 %-100 %] 98 % (05/12 0532) Weight:  [150 lb (68.04 kg)-150 lb 5.7 oz (68.2 kg)] 150 lb 5.7 oz (68.2 kg) (05/12 0532) Last BM Date: 04/24/16  Intake/Output from previous day: 05/11 0701 - 05/12 0700 In: 240 [P.O.:240] Out: 800 [Urine:800] Intake/Output this shift: Total I/O In: 100 [P.O.:100] Out: 700 [Urine:700]  PE: Gen:  Alert, NAD, pleasant Abd: Soft, NT/ND, +BS, no HSM   Lab Results:   Recent Labs  04/26/16 0414 04/26/16 1512  HGB 8.0* 8.1*  HCT 24.7* 26.0*   BMET  Recent Labs  04/25/16 0822  NA 138  K 4.3  CL 105  CO2 26  GLUCOSE 105*  BUN 25*  CREATININE 0.96  CALCIUM 7.9*   PT/INR  Recent Labs  04/25/16 0822  LABPROT 14.4  INR 1.10   CMP     Component Value Date/Time   NA 138 04/25/2016 0822   NA 143 11/05/2013 1437   K 4.3 04/25/2016 0822   CL 105 04/25/2016 0822   CO2 26 04/25/2016 0822   GLUCOSE 105* 04/25/2016 0822   GLUCOSE 99 11/05/2013 1437   BUN 25* 04/25/2016 0822   BUN 14 11/05/2013 1437   CREATININE 0.96 04/25/2016 0822   CREATININE 0.78 04/16/2016 1649   CALCIUM 7.9* 04/25/2016 0822   PROT 4.6* 04/22/2016 0340   PROT 6.9 11/24/2013 1257   ALBUMIN 2.3* 04/22/2016 0340   ALBUMIN 4.2 11/05/2013 1437   AST 16 04/22/2016 0340   ALT 12* 04/22/2016 0340   ALKPHOS 36* 04/22/2016 0340   BILITOT 0.5 04/22/2016 0340   GFRNONAA 54* 04/25/2016 0822   GFRNONAA 72 04/16/2016 1649   GFRAA >60 04/25/2016 0822   GFRAA 83 04/16/2016 1649   Lipase  No results found for: LIPASE     Studies/Results: No results  found.  Anti-infectives: Anti-infectives    Start     Dose/Rate Route Frequency Ordered Stop   04/19/16 1400  cephALEXin (KEFLEX) capsule 500 mg  Status:  Discontinued     500 mg Oral Every 8 hours 04/19/16 0954 04/24/16 0820   04/17/16 0700  cefTRIAXone (ROCEPHIN) 1 g in dextrose 5 % 50 mL IVPB  Status:  Discontinued     1 g 100 mL/hr over 30 Minutes Intravenous Every 24 hours 04/17/16 0658 04/19/16 0954       Assessment/Plan Upper GI bleeding -Localized to the 2nd and 3rd portion of a duodenum from AVMs. GI has scoped the patient 2 times, IR embolization. It is unclear whether the patient is continuing to bleed or hemoglobin is equilibrating. No reported dark stools yesterday.  She is hemodynamically stable at present time. There are no good surgical options for the second and third portion of the duodenum. Surgery is a last resort.  Would continue to trend H&H. Re-attempt angio embolization if she re-bleeds. -Tolerating soft diet since 04/25/16, Hgb 8.1/Hct 26.0 -Agree with carafate and protonix  4/25 EGD which revealed four non bleeding angiodysplastic lesions in the duodenum treated with APC(second portion of the duodenum), 2 non bleeding angiodysplastic lesions in the duodenum  treated with APC(third portion of the duodenum) 5/2 2 units of pRBCs 5/3 Enteroscopy which showed one non bleeding duodenal ulcer with a visible vessel possible at prior site of APC. Clips were placed. The jejunum was normal.  5/4 1u pRBCs(hgb 6.5) 5/5 NM bleeding scan- bleeding favored proximal small bowel to known duodenal AVMs.(hgb 7.4) 5/6 1u pRBCs(hgb 7) 5/7 1u pRBCs 5/7 angioemobilization to pancreaticoduodenal branches, GDA did not appear to supply the areas treated by endoscopy that were marked by the clips on angiography.  Tansfused 1u pRBC 5/10 transfused 2u pRBC   LOS: 10 days    Nat Christen 04/27/2016, 8:08 AM Pager: [phone]  (7am - 4:30pm M-F; 7am - 11:30am Sa/Su)

## 2016-04-27 NOTE — Care Management Important Message (Signed)
Important Message  Patient Details  Name: Nicole Perez MRN: [id number] Date of Birth: 09-01-1933   Medicare Important Message Given:  Yes    [name] May 04/27/2016, 11:54 AM

## 2016-04-27 NOTE — Care Management Note (Signed)
Case Management Note  Patient Details  Name: Nicole Perez MRN: [id number] Date of Birth: November 26, 1933  Subjective/Objective:                 Spoke with patient and her daughters in the room. Patient is active with [name] for RN PT OT, CM will place resumption order to include HHA as well. Referral made to [facility] and notified of DC today/ tomorrow, labs pending. Patient lives at home with family nearby but not in house, they will provide intermittent supervision. DME offered, patient has all DME needed per daughter to include [facility] and walker.   Action/Plan:  Resumption order placed, anticipate DC to home with daughters providing intermittent supervision and HH with [facility].   Expected Discharge Date:  04/20/16               Expected Discharge Plan:  [place]  In-House Referral:     Discharge planning Services  CM Consult  Post Acute Care Choice:  Resumption of Svcs/PTA Provider Choice offered to:  Patient, Adult Children  DME Arranged:    DME Agency:     HH Arranged:  PT, OT, Nurse's Aide, RN [place] Agency:  [facility]  Status of Service:  Completed, signed off  Medicare Important Message Given:  Yes Date Medicare IM Given:    Medicare IM give by:    Date Additional Medicare IM Given:    Additional Medicare Important Message give by:     If discussed at [place] of Stay Meetings, dates discussed:    Additional Comments:  Carles Collet, RN 04/27/2016, 12:02 PM

## 2016-04-27 NOTE — Progress Notes (Signed)
Daily Rounding Note  04/27/2016, 11:48 AM  LOS: 10 days   SUBJECTIVE:       Called to see pt for declining Hgb.  Stools constipated but dark.    04/11/15 EGD.  Normal esophagus. Normal stomach. Normal duodenal bulb. Four non-bleeding angiodysplastic lesions in the duodenum. Treated with argon plasma coagulation (APC). Two non-bleeding angiodysplastic lesions in the duodenum. Treated with argon plasma coagulation  ANGIODYSPLASIA AS THE CAUSE FOR HER RECURRENT MELENA 04/18/16 entersocopy; non-bleeding ulcer with VV in duodenum at site of previous APC ablation.  Treated with APC and clips placed.  5/5/7 RBC scan bleeding in region duodenum at site of clips 04/22/16 embolization of GDA  7 PRBCs since admissiion, last 2 on 5/10 for hgb 6.6.  Hgb since 5/10: 9.1, 8.8, 8.1, 7.1.    Tolerating soft diet.  Epigastric discomfort, relieved with Hydrocodone.  No SOB or CP.  Eating well, not NPO. On BID oral Protonix.  Carafate added 5/9.    OBJECTIVE:         Vital signs in last 24 hours:    Temp:  [97.5 F (36.4 C)-99.1 F (37.3 C)] 97.5 F (36.4 C) (05/12 0532) Pulse Rate:  [51-68] 68 (05/12 0532) Resp:  [18-20] 18 (05/12 0532) BP: (95-113)/(47-52) 113/52 mmHg (05/12 0532) SpO2:  [97 %-100 %] 98 % (05/12 0532) Weight:  [68.04 kg (150 lb)-68.2 kg (150 lb 5.7 oz)] 68.2 kg (150 lb 5.7 oz) (05/12 0532) Last BM Date: 04/26/16 Filed Weights   04/18/16 0509 04/26/16 1650 04/27/16 0532  Weight: 67.2 kg (148 lb 2.4 oz) 68.04 kg (150 lb) 68.2 kg (150 lb 5.7 oz)   General: delightful, HOH.  Looks ok.  Comfortable, NAD   Heart: RRR.  No mrg Chest: clear bil.  No sob or cough.  Abdomen: soft, NT, ND, active BS Rectal: greenish, black stool is rapidly FOBT +. Small amount formed BM in rectum.    Extremities: no CCE Neuro/Psych:  Oriented x 3.  HOH.  Calm, cooperative. Fully alert.   Intake/Output from previous day: 05/11 0701 - 05/12  0700 In: 240 [P.O.:240] Out: 800 [Urine:800]  Intake/Output this shift: Total I/O In: 520 [P.O.:520] Out: 700 [Urine:700]  Lab Results:  Recent Labs  04/26/16 0414 04/26/16 1512 04/27/16 0807  WBC  --   --  4.9  HGB 8.0* 8.1* 7.1*  HCT 24.7* 26.0* 22.5*  PLT  --   --  195   BMET  Recent Labs  04/25/16 0822 04/27/16 0807  NA 138 141  K 4.3 4.2  CL 105 106  CO2 26 24  GLUCOSE 105* 91  BUN 25* 24*  CREATININE 0.96 0.84  CALCIUM 7.9* 8.2*   LFT No results for input(s): PROT, ALBUMIN, AST, ALT, ALKPHOS, BILITOT, BILIDIR, IBILI in the last 72 hours. PT/INR  Recent Labs  04/25/16 0822  LABPROT 14.4  INR 1.10   Hepatitis Panel No results for input(s): HEPBSAG, HCVAB, HEPAIGM, HEPBIGM in the last 72 hours.  Studies/Results: No results found.   Scheduled Meds: . antiseptic oral rinse  7 mL Mouth Rinse BID  . pantoprazole  40 mg Oral BID AC  . sodium chloride flush  3 mL Intravenous Q12H  . sucralfate  1 g Oral TID WC & HS   Continuous Infusions:  PRN Meds:.acetaminophen **OR** acetaminophen, alum & mag hydroxide-simeth, HYDROcodone-acetaminophen, magnesium hydroxide, MUSCLE RUB, ondansetron **OR** ondansetron (ZOFRAN) IV, phenol, polyethylene glycol   ASSESMENT:   *  Upper GI bleed.  Appears to be ongoing despite above measures.  No recent po Iron to confound presence of dark stools.   *  Blood loss anemia.  S/p multiple PRBCs.  Hg again dropping.    PLAN   *  CBC in AM.  No transfusions unless she gets unstable.   If Hgb drops overnight, plan EGD with Dr Benson Norway.  In case 5/13 EGD necessary, ordered NPO post midnight.  Called resident, Dr Cyndia Skeeters, to alert him of plan and to ask to hold transfusions unless pt symptomatic.  *  Give 2 doses MOM now, MOM works well for her, Miralax not much help in past.    Nicole Perez  04/27/2016, 11:48 AM Pager: [phone]

## 2016-04-27 NOTE — Progress Notes (Signed)
Physical Therapy Treatment Patient Details Name: Nicole Perez MRN: [id number] DOB: Jan 09, 1933 Today's Date: 04/27/2016    History of Present Illness Nicole Perez is a 82 y.o. female with past medical history as listed below who was recently admitted on 4/25 with chest pain, dyspnea, weakness and anemia as well as dark stools. Subsequent endoscopy revealed angiodysplastic lesions in the duodenum which were treated with APC. Patient was subsequently discharged on 4/27 and readmitted again on 5/1 with recurrent GI bleeding/melena. Endoscopy on 5/3 revealed nonbleeding duodenal ulcer which was treated with APC/clipping. Nuclear medicine scan on 5/5 revealed trace/faint amount of activity suggestive of a small, intermittent bleed in the proximal small bowel region. Request now received from GI for mesenteric/visceral arteriography with possible embolization.    PT Comments    Patient continues to progress toward mobility goals. Continue to progress as tolerated with anticipated d/c home with HHPT.   Follow Up Recommendations  Home health PT ([facility], [facility])     Equipment Recommendations  3in1 (PT)    Recommendations for Other Services       Precautions / Restrictions Precautions Precautions: Fall Restrictions Weight Bearing Restrictions: No    Mobility  Bed Mobility Overal bed mobility: Modified Independent             General bed mobility comments: increased time to perform, no physical assist required  Transfers Overall transfer level: Needs assistance Equipment used: Rolling walker (2 wheeled) Transfers: Sit to/from Stand Sit to Stand: Supervision         General transfer comment: from EOB and commode with use of grab bar; cues for safe hand placement and use of AD   Ambulation/Gait Ambulation/Gait assistance: Supervision Ambulation Distance (Feet): 250 Feet Assistive device: Rolling walker (2 wheeled) Gait Pattern/deviations: Step-through pattern;Decreased  stride length Gait velocity: decreased   General Gait Details: cues for position of RW and posture; no unsteadiness noted   Stairs            Wheelchair Mobility    Modified Rankin (Stroke Patients Only)       Balance   Sitting-balance support: No upper extremity supported;Feet supported Sitting balance-Leahy Scale: Good     Standing balance support: Single extremity supported;During functional activity Standing balance-Leahy Scale: Good                      Cognition Arousal/Alertness: Awake/alert Behavior During Therapy: WFL for tasks assessed/performed Overall Cognitive Status: Within Functional Limits for tasks assessed                      Exercises General Exercises - Lower Extremity Ankle Circles/Pumps: AROM;Both;20 reps;Supine Long Arc Quad: AROM;Both;10 reps Straight Leg Raises: AROM;Both;10 reps;Supine Hip Flexion/Marching: AROM;Both;10 reps;Supine    General Comments        Pertinent Vitals/Pain Pain Assessment: No/denies pain Pain Intervention(s): Monitored during session    Home Living                      Prior Function            PT Goals (current goals can now be found in the care plan section) Acute Rehab PT Goals Patient Stated Goal: to go home Time For Goal Achievement: 05/05/16 Progress towards PT goals: Progressing toward goals    Frequency  Min 3X/week    PT Plan Current plan remains appropriate    Co-evaluation  End of Session Equipment Utilized During Treatment: Gait belt Activity Tolerance: Patient tolerated treatment well Patient left: with call bell/phone within reach;in bed;with family/visitor present;with nursing/sitter in room     Time: 1452-1520 PT Time Calculation (min) (ACUTE ONLY): 28 min  Charges:  $Gait Training: 8-22 mins $Therapeutic Exercise: 8-22 mins                    G Codes:      Salina April, PTA Pager: [phone]   04/27/2016, 3:27 PM

## 2016-04-28 ENCOUNTER — Encounter (HOSPITAL_COMMUNITY)
Admission: EM | Disposition: A | Discharge status: Other Acute Inpatient Hospital | Payer: Self-pay | Source: Home / Self Care | Attending: Family Medicine

## 2016-04-28 HISTORY — PX: GIVENS CAPSULE STUDY: SHX5432

## 2016-04-28 LAB — CBC
HCT: 20.8 % — ABNORMAL LOW (ref 36.0–46.0)
Hemoglobin: 6.6 g/dL — CL (ref 12.0–15.0)
MCH: 29.1 pg (ref 26.0–34.0)
MCHC: 31.7 g/dL (ref 30.0–36.0)
MCV: 91.6 fL (ref 78.0–100.0)
Platelets: 193 10*3/uL (ref 150–400)
RBC: 2.27 MIL/uL — ABNORMAL LOW (ref 3.87–5.11)
RDW: 16.3 % — AB (ref 11.5–15.5)
WBC: 5.5 10*3/uL (ref 4.0–10.5)

## 2016-04-28 LAB — BASIC METABOLIC PANEL
Anion gap: 8 (ref 5–15)
BUN: 25 mg/dL — AB (ref 6–20)
CHLORIDE: 107 mmol/L (ref 101–111)
CO2: 26 mmol/L (ref 22–32)
Calcium: 8 mg/dL — ABNORMAL LOW (ref 8.9–10.3)
Creatinine, Ser: 0.99 mg/dL (ref 0.44–1.00)
GFR calc Af Amer: >60 mL/min (ref >60)
GFR calc non Af Amer: 52 mL/min — ABNORMAL LOW (ref >60)
GLUCOSE: 96 mg/dL (ref 65–99)
POTASSIUM: 4.8 mmol/L (ref 3.5–5.1)
Sodium: 141 mmol/L (ref 135–145)

## 2016-04-28 LAB — PREPARE RBC (CROSSMATCH)

## 2016-04-28 LAB — HEMOGLOBIN AND HEMATOCRIT, BLOOD
HEMATOCRIT: 25.4 % — AB (ref 36.0–46.0)
Hemoglobin: 7.9 g/dL — ABNORMAL LOW (ref 12.0–15.0)

## 2016-04-28 SURGERY — IMAGING PROCEDURE, GI TRACT, INTRALUMINAL, VIA CAPSULE
Anesthesia: LOCAL

## 2016-04-28 MED ORDER — SODIUM CHLORIDE 0.9 % IV SOLN
Freq: Once | INTRAVENOUS | Status: AC
  Administered 2016-04-28: 19:00:00 via INTRAVENOUS

## 2016-04-28 MED ORDER — SODIUM CHLORIDE 0.9 % IV SOLN
Freq: Once | INTRAVENOUS | Status: AC
  Administered 2016-04-28: 11:00:00 via INTRAVENOUS

## 2016-04-28 SURGICAL SUPPLY — 1 items: TOWEL COTTON PACK 4EA (MISCELLANEOUS) ×4 IMPLANT

## 2016-04-28 NOTE — Progress Notes (Signed)
CRITICAL VALUE ALERT  Critical value received:  HGB 6.6   Date of notification:  04/28/16   Time of notification:  8:50   Critical value read back:Yes.    Nurse who received alert:  Rosalio Loud RN  MD notified (1st page):  Dr. Benson Norway  Time of first page:  8:51  MD notified (2nd page):  Time of second page:  Responding MD:  Dr. Benson Norway  Time MD responded:  8:53am

## 2016-04-28 NOTE — Progress Notes (Signed)
Progress Note for [facility]  Subjective: No acute events.  Objective: Vital signs in last 24 hours: Temp:  [98.1 F (36.7 C)-98.7 F (37.1 C)] 98.2 F (36.8 C) (05/13 0455) Pulse Rate:  [63-106] 63 (05/13 0455) Resp:  [12-18] 12 (05/13 0455) BP: (100-121)/(41-63) 100/45 mmHg (05/13 0455) SpO2:  [96 %-100 %] 100 % (05/13 0455) Weight:  [68.7 kg (151 lb 7.3 oz)] 68.7 kg (151 lb 7.3 oz) (05/13 0237) Last BM Date: 04/26/16  Intake/Output from previous day: 05/12 0701 - 05/13 0700 In: 1080 [P.O.:1080] Out: 1200 [Urine:1200] Intake/Output this shift:    General appearance: alert and no distress GI: soft, non-tender; bowel sounds normal; no masses,  no organomegaly  Lab Results:  Recent Labs  04/26/16 1512 04/27/16 0807 04/27/16 1343  WBC  --  4.9  --   HGB 8.1* 7.1* 7.4*  HCT 26.0* 22.5* 23.7*  PLT  --  195  --    BMET  Recent Labs  04/25/16 0822 04/27/16 0807  NA 138 141  K 4.3 4.2  CL 105 106  CO2 26 24  GLUCOSE 105* 91  BUN 25* 24*  CREATININE 0.96 0.84  CALCIUM 7.9* 8.2*   LFT No results for input(s): PROT, ALBUMIN, AST, ALT, ALKPHOS, BILITOT, BILIDIR, IBILI in the last 72 hours. PT/INR  Recent Labs  04/25/16 0822  LABPROT 14.4  INR 1.10   Hepatitis Panel No results for input(s): HEPBSAG, HCVAB, HEPAIGM, HEPBIGM in the last 72 hours. C-Diff No results for input(s): CDIFFTOX in the last 72 hours. Fecal Lactopherrin No results for input(s): FECLLACTOFRN in the last 72 hours.  Studies/Results: No results found.  Medications:  Scheduled: . antiseptic oral rinse  7 mL Mouth Rinse BID  . pantoprazole  40 mg Oral BID AC  . sodium chloride flush  3 mL Intravenous Q12H  . sucralfate  1 g Oral TID WC & HS   Continuous:   Assessment/Plan: 1) GI bleed. 2) History of small bowel AVMs.   The CBC has not been drawn this AM.  She is clinically stable.  My inclination is to pursue a capsule endoscopy as I think this will be the highest yield in  this recurrent bleed case.  I will finalize my results pending the results of the CBC this AM.  Plan: 1) Await CBC. 2) ? Capsule endoscopy. 3) Maintain NPO status.  LOS: 11 days   Nicole Perez D 04/28/2016, 7:25 AM

## 2016-04-28 NOTE — Progress Notes (Signed)
10 Days Post-Op  Subjective: NAE overnight. States this morning that she feels good.  Had 1 BM which she describes as normal without any blood or evidence of blood. Notes some "tingling" in her upper abdomen but no "pain."  Objective: Vital signs in last 24 hours: Temp:  [98.1 F (36.7 C)-98.7 F (37.1 C)] 98.2 F (36.8 C) (05/13 0455) Pulse Rate:  [63-106] 63 (05/13 0455) Resp:  [12-18] 12 (05/13 0455) BP: (100-121)/(41-63) 100/45 mmHg (05/13 0455) SpO2:  [96 %-100 %] 100 % (05/13 0455) Weight:  [68.7 kg (151 lb 7.3 oz)] 68.7 kg (151 lb 7.3 oz) (05/13 0237) Last BM Date: 04/26/16  Intake/Output from previous day: 05/12 0701 - 05/13 0700 In: 1080 [P.O.:1080] Out: 1200 [Urine:1200] Intake/Output this shift:    General appearance: alert, cooperative, appears stated age, no distress and laying comfortably in bed. Resp: Nonlabored respirations with bilateral chest wall expansion. Cardio: regular rate and rhythm GI: Soft, nontender.  Nondistended.  No rebound of guarding.  Lab Results:   Recent Labs  04/27/16 0807 04/27/16 1343 04/28/16 0728  WBC 4.9  --  5.5  HGB 7.1* 7.4* 6.6*  HCT 22.5* 23.7* 20.8*  PLT 195  --  193   BMET  Recent Labs  04/27/16 0807 04/28/16 0725  NA 141 141  K 4.2 4.8  CL 106 107  CO2 24 26  GLUCOSE 91 96  BUN 24* 25*  CREATININE 0.84 0.99  CALCIUM 8.2* 8.0*   PT/INR No results for input(s): LABPROT, INR in the last 72 hours. ABG No results for input(s): PHART, HCO3 in the last 72 hours.  Invalid input(s): PCO2, PO2  Studies/Results: No results found.  Anti-infectives: Anti-infectives    Start     Dose/Rate Route Frequency Ordered Stop   04/19/16 1400  cephALEXin (KEFLEX) capsule 500 mg  Status:  Discontinued     500 mg Oral Every 8 hours 04/19/16 0954 04/24/16 0820   04/17/16 0700  cefTRIAXone (ROCEPHIN) 1 g in dextrose 5 % 50 mL IVPB  Status:  Discontinued     1 g 100 mL/hr over 30 Minutes Intravenous Every 24 hours  04/17/16 0658 04/19/16 0954      Assessment/Plan: s/p Procedure(s): ENTEROSCOPY (N/A)  82 yo F with recurrent GI bleed -Localized to the 2nd and 3rd portion of a duodenum from AVMs. GI has scoped the patient 2 times, IR embolization. She has baseline anemia but has improved with transfusions.  Yesterday her hgb was 7.1 but now is down to 6.6. She is hemodynamically stable at present time with a non-bloody bowel movement yesterday. There are no good surgical options for the second and third portion of the duodenum. Surgery is a last resort. Would continue to trend H&H. Agree with plan for GI imaging today.  Would re-attempt angio embolization if she re-bleeds.  LOS: 11 days    Reino Kent 04/28/2016

## 2016-04-28 NOTE — Progress Notes (Signed)
Family Medicine Teaching Service Daily Progress Note Intern Pager: [phone]  Patient name: Nicole Perez Medical record number: [id number] Date of birth: 29-Nov-1933 Age: 82 y.o. Gender: female  Primary Care Provider: MCDIARMID,TODD D, MD Consultants: GI, Cardiology, and IR  Code Status: DNR  Pt Overview and Major Events to Date:  5/2 admitted with symptomatic anemia in the setting of GI bleed and received 2 units 5/3 push enterescopy 5/4 transfused 1 unit pRBCs 5/5 tagged RBC scan 5/6 transfused 1 unit pRBCs 5/7 embolization of pancreaticoduodenal arteries by IR 5/7 transfused 1u pRBC 5/10 transfused 2u pRBC  5/13 continues to bleed. Holding txf pending GI recs.  Assessment and Plan: Nicole Perez is a 82 y.o. female presenting with symptomatic anemia. PMH is significant for recent hospitalization for anemia/ GI bleed/ Angiodysplasia, hypertension, neurogenic bladder and OA  Symptomatic anemia 2/2 GI bleed: Continued to drop Hgb. Hgb 5.5 on admission>>7u>>8>7.1. 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 Push enteroscopy on 5/3 revealed one non-bleeding duodenal ulcer treated with argon plasma coagulation and clip placement. Bleeding scan on 5/5 showed a bleed that is favored to be from the proximal small bowel, potentially secondary to patient's known duodenal AVM. S/p embolization of pancreaticoduodenal arteries on 5/7.  - Very difficult case. Appreciate GI, Surgery, and IR recs in trying to help this unfortunate lady. - CBC dropped again this am, holding txf per GI instructions yesterday.  - Will discuss with Dr. Benson Norway > capsule endoscopy - H/H again in 8 hours.  - Protonix 40 twice a day - Carafate 1 gm three times a day  - VS per floor protocol  Epigastric Abdominal Pain: improved. Mild TTP over epigastric areas. resolved - Continue PPI and Carafate as above  Chest pain: resolved. ACS ruled out with serial trop and EKG  - Appreciate card recs, Nuclear scan as outpt - will  avoid ASA in the setting of current GI bleed  HTN: Holding for now due to blood pressures. lisinopril and HCTZ at home - Holding home meds - Monitor BP  Bradycardia: HR in 60's this morning. Had history of junctional rhythm. On 30 day monitor before admission. -will continue to monitor  Neurogenic bladder/UTI: self caths at home. S/p CTX 5/2>5/4 and Keflex 5/4>5/8 - I &O cath as needed   Osteoarthritis: Foot and leg pain. History of back & LE OA. On Lorcet at home - [place] 10/325 mg twice a day  - Avoid NSAIDs in the setting of GI bleed - Bowel regimen (miralax and MOM)  FEN/GI:  -Regular diet  Prophylaxis: SCDs in the setting of GI bleeding  Disposition: continue regular floor until Hgb is stable for discharge.   Subjective:  Feeling down this morning. She says she is no longer having abdominal pain. She says she is "ok". No bleeding, BRBPR, SOB or chest pain.    Objective: Temp:  [98.1 F (36.7 C)-98.7 F (37.1 C)] 98.2 F (36.8 C) (05/13 0455) Pulse Rate:  [63-106] 63 (05/13 0455) Resp:  [12-18] 12 (05/13 0455) BP: (100-121)/(41-63) 100/45 mmHg (05/13 0455) SpO2:  [96 %-100 %] 100 % (05/13 0455) Weight:  [151 lb 7.3 oz (68.7 kg)] 151 lb 7.3 oz (68.7 kg) (05/13 0237) Physical Exam: GEN: laying in bed, NAD CVS: RRR, normal s1 and s2, No MGR RESP: CTAB, RR GI: +BS normal, S, NT, ND, no epigastric tenderness.  NEURO: No deficits AAOx3 Laboratory:  Recent Labs Lab 04/24/16 0224  04/27/16 0807 04/27/16 1343 04/28/16 0728  WBC 6.9  --  4.9  --  5.5  HGB 7.4*  < > 7.1* 7.4* 6.6*  HCT 23.4*  < > 22.5* 23.7* 20.8*  PLT 151  --  195  --  193  < > = values in this interval not displayed.  Recent Labs Lab 04/22/16 0340  04/25/16 0822 04/27/16 0807 04/28/16 0725  NA 139  < > 138 141 141  K 4.4  < > 4.3 4.2 4.8  CL 107  < > 105 106 107  CO2 21*  < > 26 24 26   BUN 26*  < > 25* 24* 25*  CREATININE 0.96  < > 0.96 0.84 0.99  CALCIUM 8.0*  < > 7.9* 8.2* 8.0*   PROT 4.6*  --   --   --   --   BILITOT 0.5  --   --   --   --   ALKPHOS 36*  --   --   --   --   ALT 12*  --   --   --   --   AST 16  --   --   --   --   GLUCOSE 115*  < > 105* 91 96  < > = values in this interval not displayed.  Imaging/Diagnostic Tests: No results found.  Aquilla Hacker, MD 04/28/2016, 9:00 AM PGY-2, [place] Intern pager: [phone], text pages welcome

## 2016-04-29 ENCOUNTER — Inpatient Hospital Stay (HOSPITAL_COMMUNITY): Payer: Medicare Other

## 2016-04-29 ENCOUNTER — Encounter (HOSPITAL_COMMUNITY): Payer: Self-pay | Admitting: Gastroenterology

## 2016-04-29 LAB — TYPE AND SCREEN
ABO/RH(D): O POS
ANTIBODY SCREEN: NEGATIVE
UNIT DIVISION: 0
UNIT DIVISION: 0
UNIT DIVISION: 0
UNIT DIVISION: 0

## 2016-04-29 LAB — HEMOGLOBIN AND HEMATOCRIT, BLOOD
HEMATOCRIT: 25.2 % — AB (ref 36.0–46.0)
Hemoglobin: 8.3 g/dL — ABNORMAL LOW (ref 12.0–15.0)

## 2016-04-29 MED ORDER — TECHNETIUM TC 99M-LABELED RED BLOOD CELLS IV KIT
27.4000 | PACK | Freq: Once | INTRAVENOUS | Status: AC | PRN
  Administered 2016-04-29: 27 via INTRAVENOUS

## 2016-04-29 NOTE — Progress Notes (Signed)
Family Medicine Teaching Service Daily Progress Note Intern Pager: [phone]  Patient name: Nicole Perez Medical record number: [id number] Date of birth: [birth year] Age: 82 y.o. Gender: female  Primary Care Provider: MCDIARMID,TODD D, MD Consultants: GI, Cardiology, and IR  Code Status: DNR  Pt Overview and Major Events to Date:  EGD revealed 6 AVM's in duodenum treated with APC prior admission  5/2 admitted with symptomatic anemia in the setting of GI bleed and received 2 units 5/3 push enterescopy: one non-bleeding duodenal ulcer treated with argon plasma coagulation and clip placement 5/4 transfused 1 unit pRBCs 5/5 tagged RBC scan: bleed that is favored to be from the proximal small bowel, potentially secondary to patient's known duodenal AVM. 5/6 transfused 1 unit pRBCs 5/7 embolization of pancreaticoduodenal arteries by IR 5/7 transfused 1u pRBC 5/10 transfused 2u pRBC  5/13 Capsule endoscopy 5/13 transfused 1u pRBC  Assessment and Plan: Nicole Perez is a 82 y.o. female presenting with symptomatic anemia. PMH is significant for recent hospitalization for anemia/ GI bleed/ Angiodysplasia, hypertension, neurogenic bladder and OA  Symptomatic anemia 2/2 GI bleed: Continued to drop Hgb. Hgb 5.5 on admission>>8u>7.9>8.3. Very difficult case.  - GI on board and appreciate recs  -Capsule endoscopy reading pending - Surgery following. Appreciate recs  -One more embolization before Whipple procedure if she continues to bleed. -IR's last recs on 5/10:  -Angio if HD unstable or H&H down-trending. But this has risk of ischemia - am CBC - Protonix 40 twice a day - Carafate 1 gm three times a day  - VS per floor protocol  Epigastric Abdominal Pain: improved. Mild TTP over epigastric areas. resolved - Continue PPI and Carafate as above  Chest pain: resolved. ACS ruled out with serial trop and EKG  - Appreciate card recs, Nuclear scan as outpt - will avoid ASA in the setting of  current GI bleed  HTN: Holding for now due to blood pressures. lisinopril and HCTZ at home - Holding home meds - Monitor BP  Bradycardia: HR in 60's this morning. Had history of junctional rhythm. On 30 day monitor before admission. -will continue to monitor  Neurogenic bladder/UTI: self caths at home. S/p CTX 5/2>5/4 and Keflex 5/4>5/8 - I &O cath as needed   Osteoarthritis: Foot and leg pain. History of back & LE OA. On Lorcet at home - [place] 10/325 mg twice a day  - Avoid NSAIDs in the setting of GI bleed - Bowel regimen (miralax and MOM)  FEN/GI:  -Regular diet  Prophylaxis: SCDs in the setting of GI bleeding  Disposition: continue regular floor until Hgb is stable for discharge.   Subjective:  Sitting in bed enjoying her breakfast. Reports having her usual leg pain overnight that is better this morning. The epigastric pain is better. She jokes saying the GI told her to keep the camera she swallowed once it comes out.   Objective: Temp:  [97.6 F (36.4 C)-98.9 F (37.2 C)] 98.1 F (36.7 C) (05/14 0541) Pulse Rate:  [48-66] 66 (05/14 0541) Resp:  [16-18] 18 (05/14 0541) BP: (106-130)/(39-87) 114/55 mmHg (05/14 0541) SpO2:  [96 %-100 %] 99 % (05/14 0541) Weight:  [159 lb 9.8 oz (72.4 kg)] 159 lb 9.8 oz (72.4 kg) (05/14 0602) Physical Exam: GEN: sitting in bed and eating breakfast, always pleasant CVS: RRR, normal s1 and s2, no murmurs, no edema RESP: normal work of breathing, no crackles or wheeze GI: normal bowel sound, soft, + tender to palpation over epigastric area, non-distended  NEURO: alert and oriented, no gross defecits  Laboratory:  Recent Labs Lab 04/24/16 0224  04/27/16 0807  04/28/16 0728 04/28/16 1526 04/29/16 0521  WBC 6.9  --  4.9  --  5.5  --   --   HGB 7.4*  < > 7.1*  < > 6.6* 7.9* 8.3*  HCT 23.4*  < > 22.5*  < > 20.8* 25.4* 25.2*  PLT 151  --  195  --  193  --   --   < > = values in this interval not displayed.  Recent Labs Lab  04/25/16 0822 04/27/16 0807 04/28/16 0725  NA 138 141 141  K 4.3 4.2 4.8  CL 105 106 107  CO2 26 24 26   BUN 25* 24* 25*  CREATININE 0.96 0.84 0.99  CALCIUM 7.9* 8.2* 8.0*  GLUCOSE 105* 91 96    Imaging/Diagnostic Tests: No results found.  Mercy Riding, MD 04/29/2016, 8:51 AM PGY-1, [place] Intern pager: [phone], text pages welcome

## 2016-04-29 NOTE — Progress Notes (Signed)
I discussed the results of the capsule endoscopy with the patient and her family.  Please refer to the report for full details.  There is evidence of active bleeding and it appears to be too far to reach with enteroscopy.  I will repeat the bleeding scan to help localize the bleeding.  If there is active bleeding, then a CTA can be pursued and then potentially intervention with IR to help arrest the bleeding.  An alternative is to contact [facility] for potential transfer.  They have the double balloon enteroscopy and the site can most likely be reached with this specialized endoscope, however, I do not know who has taken over this procedure after Dr. Arsenio Loader retired.  The patient and the family understand the situation and the current plan.

## 2016-04-29 NOTE — Progress Notes (Signed)
1 Day Post-Op  Subjective: Stable and alert.  No complaints. She states she has not had a bowel movement in the last 24 hours Capsule endoscopy study underway Ambulated in hall without dizziness States that her legs feel heavy but this is a chronic problem. Received 2 units packed cells yesterday with appropriate hemoglobin rise from 6.6-8.3.  Objective: Vital signs in last 24 hours: Temp:  [97.6 F (36.4 C)-98.9 F (37.2 C)] 98.1 F (36.7 C) (05/14 0541) Pulse Rate:  [48-66] 66 (05/14 0541) Resp:  [16-18] 18 (05/14 0541) BP: (106-130)/(39-87) 114/55 mmHg (05/14 0541) SpO2:  [96 %-100 %] 99 % (05/14 0541) Weight:  [72.4 kg (159 lb 9.8 oz)] 72.4 kg (159 lb 9.8 oz) (05/14 0602) Last BM Date: 04/28/16  Intake/Output from previous day: 05/13 0701 - 05/14 0700 In: 1001 [Blood:1001] Out: 800 [Urine:800] Intake/Output this shift: Total I/O In: 335 [Blood:335] Out: 500 [Urine:500]  General appearance: Alert.  No distress.  Mental status normal.  Cooperative. Resp: clear to auscultation bilaterally GI: soft, non-tender; bowel sounds normal; no masses,  no organomegaly  Lab Results:   Recent Labs  04/27/16 0807  04/28/16 0728 04/28/16 1526 04/29/16 0521  WBC 4.9  --  5.5  --   --   HGB 7.1*  < > 6.6* 7.9* 8.3*  HCT 22.5*  < > 20.8* 25.4* 25.2*  PLT 195  --  193  --   --   < > = values in this interval not displayed. BMET  Recent Labs  04/27/16 0807 04/28/16 0725  NA 141 141  K 4.2 4.8  CL 106 107  CO2 24 26  GLUCOSE 91 96  BUN 24* 25*  CREATININE 0.84 0.99  CALCIUM 8.2* 8.0*   PT/INR No results for input(s): LABPROT, INR in the last 72 hours. ABG No results for input(s): PHART, HCO3 in the last 72 hours.  Invalid input(s): PCO2, PO2  Studies/Results: No results found.  Anti-infectives: Anti-infectives    Start     Dose/Rate Route Frequency Ordered Stop   04/19/16 1400  cephALEXin (KEFLEX) capsule 500 mg  Status:  Discontinued     500 mg Oral Every 8  hours 04/19/16 0954 04/24/16 0820   04/17/16 0700  cefTRIAXone (ROCEPHIN) 1 g in dextrose 5 % 50 mL IVPB  Status:  Discontinued     1 g 100 mL/hr over 30 Minutes Intravenous Every 24 hours 04/17/16 0658 04/19/16 0954      Assessment/Plan: s/p Procedure(s): GIVENS CAPSULE STUDY  82 year old female with recurrent upper GI bleed Presented with bleeding from duodenal AVMs Status post endoscopy 2 and IR embolization 1. Clinically does not appear to be actively bleeding at this time Await capsule endoscopy results and GI advice  Surgical options are limited to pancreaticoduodenectomy.  Very high risk at her age. We have agreed that if she rebleeds that another attempt at angioembolization would be more appropriate than emergency Whipple procedure.   LOS: 12 days    Duel Conrad M 04/29/2016

## 2016-04-30 ENCOUNTER — Inpatient Hospital Stay (HOSPITAL_COMMUNITY): Payer: Medicare Other

## 2016-04-30 HISTORY — PX: COLONIC EMBOLIZATION: SHX1373

## 2016-04-30 LAB — CBC
HCT: 23.2 % — ABNORMAL LOW (ref 36.0–46.0)
HEMOGLOBIN: 7.6 g/dL — AB (ref 12.0–15.0)
MCH: 28.7 pg (ref 26.0–34.0)
MCHC: 32.8 g/dL (ref 30.0–36.0)
MCV: 87.5 fL (ref 78.0–100.0)
PLATELETS: 199 10*3/uL (ref 150–400)
RBC: 2.65 MIL/uL — AB (ref 3.87–5.11)
RDW: 17.6 % — ABNORMAL HIGH (ref 11.5–15.5)
WBC: 5.4 10*3/uL (ref 4.0–10.5)

## 2016-04-30 LAB — BASIC METABOLIC PANEL
Anion gap: 6 (ref 5–15)
BUN: 28 mg/dL — AB (ref 6–20)
CHLORIDE: 108 mmol/L (ref 101–111)
CO2: 26 mmol/L (ref 22–32)
Calcium: 7.9 mg/dL — ABNORMAL LOW (ref 8.9–10.3)
Creatinine, Ser: 0.94 mg/dL (ref 0.44–1.00)
GFR, EST NON AFRICAN AMERICAN: 56 mL/min — AB (ref >60)
Glucose, Bld: 83 mg/dL (ref 65–99)
Potassium: 4.1 mmol/L (ref 3.5–5.1)
SODIUM: 140 mmol/L (ref 135–145)

## 2016-04-30 LAB — PROTIME-INR
INR: 1.14 (ref 0.00–1.49)
PROTHROMBIN TIME: 14.8 s (ref 11.6–15.2)

## 2016-04-30 MED ORDER — IOPAMIDOL (ISOVUE-300) INJECTION 61%
INTRAVENOUS | Status: AC
  Administered 2016-04-30: 30 mL
  Filled 2016-04-30: qty 50

## 2016-04-30 MED ORDER — FENTANYL CITRATE (PF) 100 MCG/2ML IJ SOLN
INTRAMUSCULAR | Status: AC
  Filled 2016-04-30: qty 2

## 2016-04-30 MED ORDER — FENTANYL CITRATE (PF) 100 MCG/2ML IJ SOLN
INTRAMUSCULAR | Status: AC | PRN
  Administered 2016-04-30: 25 ug via INTRAVENOUS
  Administered 2016-04-30: 50 ug via INTRAVENOUS
  Administered 2016-04-30 (×3): 25 ug via INTRAVENOUS

## 2016-04-30 MED ORDER — MIDAZOLAM HCL 2 MG/2ML IJ SOLN
INTRAMUSCULAR | Status: AC
  Filled 2016-04-30: qty 2

## 2016-04-30 MED ORDER — IOPAMIDOL (ISOVUE-300) INJECTION 61%
INTRAVENOUS | Status: AC
  Administered 2016-04-30: 120 mL
  Filled 2016-04-30: qty 200

## 2016-04-30 MED ORDER — PANTOPRAZOLE SODIUM 40 MG PO TBEC
40.0000 mg | DELAYED_RELEASE_TABLET | Freq: Every day | ORAL | Status: DC
  Administered 2016-05-01 – 2016-05-02 (×2): 40 mg via ORAL
  Filled 2016-04-30 (×3): qty 1

## 2016-04-30 MED ORDER — LIDOCAINE HCL 1 % IJ SOLN
INTRAMUSCULAR | Status: AC
  Filled 2016-04-30: qty 20

## 2016-04-30 MED ORDER — LIDOCAINE HCL 1 % IJ SOLN
INTRAMUSCULAR | Status: AC | PRN
  Administered 2016-04-30: 10 mL

## 2016-04-30 MED ORDER — MIDAZOLAM HCL 2 MG/2ML IJ SOLN
INTRAMUSCULAR | Status: AC | PRN
  Administered 2016-04-30: 1 mg via INTRAVENOUS
  Administered 2016-04-30 (×2): 0.5 mg via INTRAVENOUS

## 2016-04-30 NOTE — Procedures (Signed)
Post coil embolization of the GDA and remaining pancreatico-duodenal branch of the SMA.   No immediate post procedural complications.   EBL: None Keep right leg straight for 4 hrs.    SignedSandi Mariscal [phone] 04/30/2016, 5:20 PM

## 2016-04-30 NOTE — Sedation Documentation (Signed)
Patient is resting comfortably. 

## 2016-04-30 NOTE — Sedation Documentation (Signed)
Vital signs stable. Pt states pain is still present in hip/buttocks but is relieved some.

## 2016-04-30 NOTE — Progress Notes (Signed)
Family Medicine Teaching Service Daily Progress Note Intern Pager: [phone]  Patient name: Nicole Perez Medical record number: [id number] Date of birth: [birth year] Age: 82 y.o. 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 5/6 Hgb dropped to 7 and transfused 1 unit pRBCs 5/7 Embolization of pancreaticoduodenal arteries by IR 5/7 Hgb 7.2, and transfused 1u pRBC 5/10 Hgb dropped to 6.6, and transfused 2u pRBC  5/13 Capsule endoscopy: bleeding from small intestine, to far to reach with endoscopy 5/13 Hgb dropped to 6.6, and transfused 1u pRBC 5/14 Tagged RBC with faint small bowel bleeding (not well localized) 5/15 Visceral Angiogram  Assessment and Plan: Nicole Perez is a 82 y.o. female presenting with symptomatic anemia. PMH is significant for recent hospitalization for anemia/ GI bleed/ Angiodysplasia, hypertension, neurogenic bladder and OA  Symptomatic anemia 2/2 GI bleed: Continued to drop Hgb. Hgb 5.5 on admission>>8u>7.9>8.3>7.6. Continues to have melanotic stool. - GI on board and appreciate recs - Surgery following. Appreciate recs - IR following.   -Angiography today - Protonix 40 twice a day - Carafate discontinued by GI today - VS per floor protocol  Epigastric Abdominal Pain: improved. Mild TTP over epigastric areas. - Continue PPI - Also on MOM as needed  Chest pain: resolved. ACS ruled out with serial trop and EKG  -  Appreciate card recs, Nuclear scan as outpt - will avoid ASA in the setting of current GI bleed  HTN: Holding for now due to blood pressures. lisinopril and HCTZ at home - Holding home meds - Monitor BP  Bradycardia: HR in 60's this morning. Had history of junctional rhythm. On 30 day monitor before admission.  Neurogenic bladder/UTI: self caths at home. S/p CTX 5/2>5/4 and Keflex 5/4>5/8 - I &O cath as needed   Osteoarthritis: Foot and leg pain. History of back & LE OA. On Lorcet at home - Garrett 10/325 mg twice a day  - Avoid NSAIDs in the setting of GI bleed - Bowel regimen (miralax and MOM)  FEN/GI:  -NPO pending angiogram  Prophylaxis: SCDs in the setting of GI bleeding  Disposition: continue regular floor pending Angio and possible transfer to [facility] if IR not able to embolize.   Subjective:  Lying in bed with daughter at bedside. Says she is tired and frustrated. Denies fatigue, chest pain, shortness of breath or other symptoms. Had melanotic stool yesterday. Denies hematochezia. Discussed about the plan for the day including angiography and possible transfer to [facility]. She is worried that her children may not be able to visit frequently if she is transferred to [facility] but agreeable to transfer.   Objective: Temp:  [97.7 F (36.5 C)] 97.7 F (36.5 C) (05/15 0501) Pulse Rate:  [47-70] 70 (05/15 0501) Resp:  [18] 18 (05/14 2128) BP: (111-115)/(54-68) 115/54 mmHg (05/15 0501) SpO2:  [98 %-99 %] 98 % (05/15 0501) Weight:  [157 lb 6.5 oz (71.4 kg)] 157 lb 6.5 oz (71.4 kg) (05/15 0227) Physical Exam: GEN: sitting in  bed, feels down and frustrated CVS: RRR, normal s1 and s2, no murmurs, no edema RESP: normal work of breathing, no crackles or wheeze GI: normal bowel sound, soft, no tenderness to palpation, non-distended NEURO: alert and oriented, no gross defecits  Laboratory:  Recent Labs Lab 04/24/16 0224  04/27/16 0807  04/28/16 0728 04/28/16 1526 04/29/16 0521   WBC 6.9  --  4.9  --  5.5  --   --   HGB 7.4*  < > 7.1*  < > 6.6* 7.9* 8.3*  HCT 23.4*  < > 22.5*  < > 20.8* 25.4* 25.2*  PLT 151  --  195  --  193  --   --   < > = values in this interval not displayed.  Recent Labs Lab 04/25/16 0822 04/27/16 0807 04/28/16 0725  NA 138 141 141  K 4.3 4.2 4.8  CL 105 106 107  CO2 26 24 26   BUN 25* 24* 25*  CREATININE 0.96 0.84 0.99  CALCIUM 7.9* 8.2* 8.0*  GLUCOSE 105* 91 96    Imaging/Diagnostic Tests: Nm Gi Blood Loss  04/29/2016  CLINICAL DATA:  GI bleed mid small bowel by capsule endoscopy. EXAM: NUCLEAR MEDICINE GASTROINTESTINAL BLEEDING SCAN TECHNIQUE: Sequential abdominal images were obtained following intravenous administration of Tc-19m labeled red blood cells. RADIOPHARMACEUTICALS:  27.4 mCi Tc-39m in-vitro labeled red cells. COMPARISON:  Nuclear medicine bleeding scan 04/20/2016 FINDINGS: Images are obtained for 2 hours. Satisfactory red blood cell tagging Faint accumulation of isotope is present in the mid abdomen bilaterally toward the end of the study. This is apparent beginning at approximately 1 hour and shows progressive accumulation of isotope compatible with active bleeding. No colonic activity identified. IMPRESSION: Findings consistent with active bleeding in the small bowel. Electronically Signed   By: Franchot Gallo M.D.   On: 04/29/2016 16:25    Mercy Riding, MD 04/30/2016, 7:27 AM PGY-1, [place] Intern pager: [phone], text pages welcome

## 2016-04-30 NOTE — Sedation Documentation (Signed)
Patient complains of right hip and buttocks pain due to supine position. MD aware

## 2016-04-30 NOTE — Consult Note (Signed)
Chief Complaint: Patient was seen in consultation today for mesenteric arteriogram with possible embolization Chief Complaint  Patient presents with  . Chest Pain  . Abnormal Lab   at the request of Dr Carol Ada  Referring Physician(s): Dr Carol Ada  Supervising Physician: Corrie Mckusick  Patient Status: In-pt   History of Present Illness: Nicole Perez is a 82 y.o. female   Known to IR Procedure: Celiac and SMA visceral arteriography; Embolization of pancreaticoduodenal arteries (04/22/2016) We have followed pt and h/h Continues to be low regardless trnasfusions No evidence of active bleed since 04/22/2016 Capsule endoscopy 5/14:  No bleeding  5/14 NM study IMPRESSION: Findings consistent with active bleeding in the small bowel.  Hgb 7.6 (8.3) 04/30/16  Now request has been made per Dr Benson Norway for recheck arteriogram with possible embolization Dr Earleen Newport has reviewed chart and imaging Feels appropriate to perform mesenteric arteriogram; if can see active bleed--could try embolization. Would need to consider transfer to [facility] if not amenable to IR procedure here  Past Medical History  Diagnosis Date  . Essential hypertension, benign 04/18/2010  . Lichen sclerosus et atrophicus of the vulva   . BACK PAIN, CHRONIC 04/18/2010    degenerative spine disease, spinal stenosis throughout spine  . DEGENERATIVE JOINT DISEASE, HIPS 09/11/2007    Multilevel degenerative spine dz and spinal stenosis  . INSOMNIA, CHRONIC 04/18/2010  . Parotid adenoma 1990, 2012    Right parotid, recurrent parotid pleimorphic adenoma.   . Osteoarthritis, multiple sites 08/11/2013  . Cystocele 08/11/2013  . Acute renal failure ([place]) 02/23/2014  . History of pneumonia 07/26/2011  . Rectal fissure 12/16/2013  . UTI (urinary tract infection) 02/22/2014  . 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 Hearing loss sensory, bilateral 07/26/2011      Right >> Left.  Left ear hearing aid b/c work discrimination in       R. ear is very poor. Audiologist-Stephanie Nance at [facility] in [place].  (05/10/2010)   . HYPERLIPIDEMIA 05/10/2010    Qualifier: Diagnosis of  By: McDiarmid MD, Sherren Mocha    . Incontinence overflow, urine 04/28/2014  . Incomplete emptying of bladder 02/09/2014  . Paresthesia of both feet 08/06/2014  . Orthostatic hypotension 08/06/2014  . Blepharitis 11/16/2014    Diagnosis by optometrist, Renaldo Harrison on exam 11/13/2014  . Dry eye syndrome 11/16/2014  . Glaucoma suspect 11/16/2014  . Blood in stool 12/20/2014  . Cholelithiasis   . Colon polyps 2006. 2016    adenomatous and hyperplaxtic  . H. pylori infection 2016    h pylori erosive gastritis, treated with PPI, antibiotics.   . Hiatal hernia 05/05/2015    Large Hiatal Hernia found on EGD by Dr Hilarie Fredrickson (GI in [place]) in work up of melena and (+) FOBT.  Marland Kitchen Urethral polyp 11/17/2015  . 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  ablated.   . Overflow incontinence 04/28/2014  . Complicated UTI (urinary tract infection) 01/30/2016    Past Surgical History  Procedure Laterality Date  . Total abdominal hysterectomy w/ bilateral salpingoophorectomy  1964    Hysterectomy and bilateral oopherectomy at age 34 for benign reasons  . Bladder suspension      Bladder tack x 2 (Dr Janice Norrie)  . Cystocele repair      Rectal prolapse and cyctocele adter hysterectomy requiring anterior repair Jerilynn Mages Edwinna Areola, MD)  . Rectocele repair  Rectal prolapse and cyctocele adter hysterectomy requiring anterior repair Jerilynn Mages. Edwinna Areola, MD)  . Urethral dilation    . Parotid gland tumor excision  1990    S/P excision of Right Parotid Gland Benign Tumor, 1990  . Reconstruction of nose  1994    Nasal bridge reconstruction (Dr Judie Grieve, 1994) for following  Forklift accident on job. Surgery complicated by nerve damage resulting in difficulty raising right eyebrow   . Carpal tunnel release   2010    Carpel Tunnel Release of  left wrist  03/2009 (Dr Fredna Dow): Nerve   . Cataract extraction w/ intraocular lens implant  2010    Dr Charise Killian (ophth)  . Breast lumpectomy      Lumpectomy of benign Breast lumps bilaterally, Dr Bubba Camp  . Laminectomy      S/P L4-5 Laminectomy (1987) for decompression of spinal stenosis  . Colonoscopy w/ polypectomy  2006  . Parotidectomy  08/30/11    Radene Journey, MD (ENT) for recurrent right parotid pleomorphic adenoma by frozen section  . Carpal tunnel release      right wrist  . Esophagogastroduodenoscopy N/A 04/10/2016    Procedure: ESOPHAGOGASTRODUODENOSCOPY (EGD);  Surgeon: Irene Shipper, MD;  Location: [facility] ENDOSCOPY;  Service: Endoscopy;  Laterality: N/A;  . Enteroscopy N/A 04/18/2016    Procedure: ENTEROSCOPY;  Surgeon: Mauri Pole, MD;  Location: [facility] ENDOSCOPY;  Service: Endoscopy;  Laterality: N/A;  . Givens capsule study N/A 04/28/2016    Procedure: GIVENS CAPSULE STUDY;  Surgeon: Carol Ada, MD;  Location: [facility];  Service: Endoscopy;  Laterality: N/A;    Allergies: Nitrofurantoin  Medications: Prior to Admission medications   Medication Sig Start Date End Date Taking? Authorizing Provider  esomeprazole (NEXIUM) 40 MG capsule Take 1 capsule (40 mg total) by mouth daily. 04/16/16  Yes Olam Idler, MD  ferrous sulfate 325 (65 FE) MG tablet Take 1 tablet (325 mg total) by mouth 2 (two) times daily with a meal. 04/11/16  Yes Mercy Riding, MD  HYDROcodone-acetaminophen (NORCO) 10-325 MG tablet Take 1 tablet by mouth every 12 (twelve) hours as needed for moderate pain or severe pain. Patient taking differently: Take 1 tablet by mouth every 8 (eight) hours as needed for moderate pain or severe pain.  04/16/16  Yes Lind Covert, MD  lidocaine (XYLOCAINE) 2 % jelly Apply 1 dime size amount of gel to the area between your vagina and rectum once daily for pain. You may use up on 2 times per day if needed 03/12/16  Yes Caren Macadam, MD   magnesium hydroxide (MILK OF MAGNESIA) 400 MG/5ML suspension Take 30 mLs by mouth daily as needed for mild constipation.   Yes Historical Provider, MD  polyethylene glycol powder (GLYCOLAX/MIRALAX) powder Take 17 g by mouth daily as needed for mild constipation, moderate constipation or severe constipation. 02/14/16  Yes Blane Ohara McDiarmid, MD     Family History  Problem Relation Age of Onset  . Coronary artery disease Mother   . Coronary artery disease Sister     open heart surgery  . Diabetes type II Sister   . Stroke Father 44  . Hypertension Father   . Lupus Brother   . Heart disease Brother   . Heart attack Sister     Social History   Social History  . Marital Status: Widowed    Spouse Name: N/A  . Number of Children: N/A  . Years of Education: N/A   Occupational History  . retired  Social History Main Topics  . Smoking status: Former Smoker    Quit date: 01/14/1984  . Smokeless tobacco: Former Systems developer  . Alcohol Use: No  . Drug Use: No  . Sexual Activity: No     Comment: hysterectomy   Other Topics Concern  . None   Social History Narrative   Has 8 children   [name], [name], involved in her care   4 brothers, 5 sisters, 3 deceased siblings   Cares for her great-grandchildren during the day   Widowed in 2009   Lives alone.   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              Review of Systems: A 12 point ROS discussed and pertinent positives are indicated in the HPI above.  All other systems are negative.  Review of Systems  Constitutional: Positive for activity change, appetite change and fatigue. Negative for fever.  Respiratory: Negative for shortness of breath.   Gastrointestinal: Negative for abdominal pain.  Neurological: Positive for weakness.  Psychiatric/Behavioral: Negative  for behavioral problems and confusion.    Vital Signs: BP 115/54 mmHg  Pulse 70  Temp(Src) 97.7 F (36.5 C) (Oral)  Resp 18  Ht 4\' 11"  (1.499 m)  Wt 157 lb 6.5 oz (71.4 kg)  BMI 31.78 kg/m2  SpO2 98%  LMP 12/17/1962  Physical Exam  Constitutional: She is oriented to person, place, and time.  Cardiovascular: Normal rate and regular rhythm.   No murmur heard. Pulmonary/Chest: Effort normal and breath sounds normal. She has no wheezes.  Abdominal: Soft. Bowel sounds are normal. There is no tenderness.  Musculoskeletal: Normal range of motion.  Neurological: She is alert and oriented to person, place, and time.  Skin: Skin is warm and dry.  Psychiatric: She has a normal mood and affect. Her behavior is normal. Judgment and thought content normal.  Nursing note and vitals reviewed.   Mallampati Score:  MD Evaluation Airway: WNL Heart: WNL Abdomen: WNL Chest/ Lungs: WNL ASA  Classification: 3 Mallampati/Airway Score: One  Imaging: Dg Chest 2 View  04/09/2016  CLINICAL DATA:  Centralized chest pain for 1 day. Squeezing sensation, palpitations for 1 day. EXAM: CHEST  2 VIEW COMPARISON:  02/22/2014 FINDINGS: Heart and mediastinal contours are within normal limits. No focal opacities or effusions. No acute bony abnormality. Degenerative changes in the shoulders. IMPRESSION: No active cardiopulmonary disease. Electronically Signed   By: Rolm Baptise M.D.   On: 04/09/2016 15:51   Nm Gi Blood Loss  04/29/2016  CLINICAL DATA:  GI bleed mid small bowel by capsule endoscopy. EXAM: NUCLEAR MEDICINE GASTROINTESTINAL BLEEDING SCAN TECHNIQUE: Sequential abdominal images were obtained following intravenous administration of Tc-69m labeled red blood cells. RADIOPHARMACEUTICALS:  27.4 mCi Tc-59m in-vitro labeled red cells. COMPARISON:  Nuclear medicine bleeding scan 04/20/2016 FINDINGS: Images are obtained for 2 hours. Satisfactory red blood cell tagging Faint accumulation of isotope is present  in the mid abdomen bilaterally toward the end of the study. This is apparent beginning at approximately 1 hour and shows progressive accumulation of isotope compatible with active bleeding. No colonic activity identified. IMPRESSION: Findings consistent with active bleeding in the small bowel. Electronically Signed   By: Franchot Gallo M.D.   On: 04/29/2016 16:25  Nm Gi Blood Loss  04/20/2016  CLINICAL DATA:  Symptomatic anemia. GI bleed. History of angiodysplasia. 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 EXAM: NUCLEAR MEDICINE GASTROINTESTINAL BLEEDING SCAN TECHNIQUE: Sequential abdominal images were obtained following intravenous administration of Tc-53m labeled red blood cells. RADIOPHARMACEUTICALS:  Twenty-seven mCi Tc-39m in-vitro labeled red cells. COMPARISON:  CT abdomen pelvis - 12/18/2014; 12/27/2013 FINDINGS: There is a very small amount of suspected intraluminal activity seen within the suspected proximal small bowel on the initial 60 minutes cine images. No definitive abnormal activity is seen on the provided anterior projection planar images. No definitive abnormal activity is seen on the subsequent 65 - 120 minute cine or anterior projection planar images. Physiologic activity is seen within the heart, liver and major vascular structures of the abdomen and pelvis. IMPRESSION: Trace/faint amount of intraluminal activity seen only on the initial 60 minute cine images, not seen on the subsequent 65 to 120 minute images suggestive of a small, intermittent bleed. 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 Critical Value/emergent results were called by telephone at the time of interpretation on 04/20/2016 at 5:57 pm to Dr. Wendee Beavers , who verbally acknowledged these results. Electronically Signed   By: Sandi Mariscal M.D.   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 The patient continues to have bleeding requiring transfusion and now presents for arteriography with possible embolization to treat persistent hemorrhage. EXAM: UTERINE FIBROID EMBOLIZATION ANESTHESIA/SEDATION: 1.5 Mg IV Versed; 75 mcg IV Fentanyl. Total Moderate Sedation Time: 2 hours and 20 minutes. The patient's level of consciousness and physiologic status were continuously monitored during the procedure by Radiology nursing. CONTRAST:  150 mL Isovue-300 FLUOROSCOPY TIME:  58 minutes and 18 seconds. PROCEDURE: The procedure, risks, benefits, and alternatives were explained to the patient. Questions regarding the procedure were encouraged and answered. The patient understands and consents to the procedure. A time-out was performed prior to initiating the procedure. The right groin was prepped with chlorhexidine in a sterile fashion, and a sterile drape was applied covering the operative field. A sterile gown and sterile gloves were used for the procedure. Local anesthesia was provided with 1% Lidocaine. Ultrasound was used to confirm patency of the right common femoral artery. After small skin incision, a 21 gauge needle was advanced into the right common femoral artery under direct ultrasound guidance. Ultrasound image documentation was performed. After establishing guide wire access, a 5-French sheath was placed. A 5 Fr Cobra catheter was advanced over a guidewire into the distal abdominal aorta. The catheter was then used to selectively catheterize the superior mesenteric artery. Selective arteriography was performed. The Cobra catheter was then advanced into the celiac axis and selective arteriography performed. The Cobra catheter was then reintroduced into the superior mesenteric artery and advanced into the proximal SMA trunk. Additional arteriography  was performed. A Renegade STC micro catheter was then advanced through the 5 French catheter and into a third order branch off of the superior mesenteric artery at the level of pancreaticoduodenal supply. Selective arteriography was performed. Transcatheter embolization was then performed through the micro catheter with introduction of 2 mm x 4 cm, 2 mm x 4 cm and 3 mm x 6 cm Interlock soft coils. Additional arteriography was performed through the micro catheter after  embolization of this branch artery. Attempt was made to catheterize an additional third order pancreaticoduodenal branch off of the SMA with the same catheter system. Ultimately, a 35 [place] Sos catheter was used to catheterize the SMA trunk followed by advancement of a Lantern micro catheter. After selective catheterization of a third order pancreaticoduodenal branch, selective arteriography was performed. Transcatheter embolization was then performed of this branch via the micro catheter with introduction of 2 mm x 4 cm, 2 mm x 2 cm, 2 mm x 4 cm and 3 mm x 5 cm Ruby soft coils. Additional arteriography was performed through the micro catheter after embolization. The micro catheter was further retracted and additional arteriography performed at the level of a second order trunk off of the SMA. Catheters were removed. Oblique arteriography centered over the femoral head was performed through the right femoral sheath prior to use of a closure device. Right femoral arteriotomy hemostasis: Cordis Exoseal COMPLICATIONS: None. FINDINGS: Initial first order SMA arteriography demonstrates prominent pancreaticoduodenal vessels off of the first second order trunk of the SMA which extends to the regions of the second, third and fourth portions of the duodenum. There are two endo clips in close proximity to each other at the level of the third portion of the duodenum. Focal areas of hypervascular blush are noted in the second and third portions of the duodenum  which correlate in location to the described areas of angiodysplasia by prior endoscopy. Other SMA supply is normal in appearance without evidence of additional hypervascular abnormalities or AV malformations. Supply to the rest of the small bowel and proximal colon appears normal. Additional selective celiac arteriography demonstrates normal anatomy with normally patent celiac trunk, a widely patent left gastric artery and normally patent common hepatic and splenic arteries. The gastroduodenal artery is the first major branch off of the common hepatic artery and supplies branches to the region of the duodenal bulb and distal stomach. No branches are seen extending to the region of the endo clips and no hypervascular areas in the duodenum are identified with celiac injection. For this reason, embolization was targeted to the pancreaticoduodenal arcade off of the superior mesenteric artery. The pancreaticoduodenal trunk off of the SMA demonstrates a superior branch as well as middle and inferior branches. Based on arteriography, hypervascular areas of abnormality were predominantly supplied by the superior and inferior branches. The inferior branch was initially catheterized with selective arteriography demonstrating subtle hypervascular abnormality near the 2 adjacent endoscopic clips. This vessel was successfully embolized resulting in occlusion of antegrade flow with proximal injection. The superior pancreaticoduodenal branch supplying the region of the second and third portions of the duodenum was extremely difficult to catheterize due to tortuous course. Ultimately, different catheter systems did allow selective catheterization and coil embolization resulting in cessation of antegrade flow in this branch. Completion arteriography demonstrates markedly reduced vascularity of the duodenum with persistent patent supply via the middle pancreaticoduodenal branch which supplies the region of the third portion of the  duodenum. Additional branches supplying the region of the fourth portion of the duodenum also remained normally patent on completion. Adequate hemostasis was achieved at the femoral arteriotomy site. IMPRESSION: The region of abnormal duodenal bleeding due to areas of angiodysplasia visualized and treated by endoscopy and also marked by endoscopic clips were supplied by branches of the pancreaticoduodenal arcade off of the proximal SMA. The gastroduodenal artery did not appear to supply this region by arteriography. Two separate pancreaticoduodenal third order arterial branches were successfully occluded with embolization coils  placed via micro catheters. This resulted in decreased vascularity to the second and third portions of the duodenum. Electronically Signed   By: Aletta Edouard M.D.   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 The patient continues to have bleeding requiring transfusion and now presents for arteriography with possible embolization to treat persistent hemorrhage. EXAM: UTERINE FIBROID EMBOLIZATION ANESTHESIA/SEDATION: 1.5 Mg IV Versed; 75 mcg IV Fentanyl. Total Moderate Sedation Time: 2 hours and 20 minutes. The patient's level of consciousness and physiologic status were continuously monitored during the procedure by Radiology nursing. CONTRAST:  150 mL Isovue-300 FLUOROSCOPY TIME:  58 minutes and 18 seconds. PROCEDURE: The procedure, risks, benefits, and alternatives were explained to the patient. Questions regarding the procedure were encouraged and answered. The patient understands and consents to the procedure. A time-out was performed prior to initiating the procedure. The right groin was prepped with chlorhexidine in a sterile fashion, and a sterile drape was applied covering the operative field. A sterile gown and sterile gloves were  used for the procedure. Local anesthesia was provided with 1% Lidocaine. Ultrasound was used to confirm patency of the right common femoral artery. After small skin incision, a 21 gauge needle was advanced into the right common femoral artery under direct ultrasound guidance. Ultrasound image documentation was performed. After establishing guide wire access, a 5-French sheath was placed. A 5 Fr Cobra catheter was advanced over a guidewire into the distal abdominal aorta. The catheter was then used to selectively catheterize the superior mesenteric artery. Selective arteriography was performed. The Cobra catheter was then advanced into the celiac axis and selective arteriography performed. The Cobra catheter was then reintroduced into the superior mesenteric artery and advanced into the proximal SMA trunk. Additional arteriography was performed. A Renegade STC micro catheter was then advanced through the 5 French catheter and into a third order branch off of the superior mesenteric artery at the level of pancreaticoduodenal supply. Selective arteriography was performed. Transcatheter embolization was then performed through the micro catheter with introduction of 2 mm x 4 cm, 2 mm x 4 cm and 3 mm x 6 cm Interlock soft coils. Additional arteriography was performed through the micro catheter after embolization of this branch artery. Attempt was made to catheterize an additional third order pancreaticoduodenal branch off of the SMA with the same catheter system. Ultimately, a 40 [place] Sos catheter was used to catheterize the SMA trunk followed by advancement of a Lantern micro catheter. After selective catheterization of a third order pancreaticoduodenal branch, selective arteriography was performed. Transcatheter embolization was then performed of this branch via the micro catheter with introduction of 2 mm x 4 cm, 2 mm x 2 cm, 2 mm x 4 cm and 3 mm x 5 cm Ruby soft coils. Additional arteriography was performed through  the micro catheter after embolization. The micro catheter was further retracted and additional arteriography performed at the level of a second order trunk off of the SMA. Catheters were removed. Oblique arteriography centered over the femoral head was performed through the right femoral sheath prior to use of a closure device. Right femoral arteriotomy hemostasis: Cordis Exoseal COMPLICATIONS: None. FINDINGS: Initial first order SMA arteriography demonstrates prominent pancreaticoduodenal vessels off of the first second order trunk of the SMA which extends to the regions of the second, third and fourth portions of the duodenum. There are two endo clips in  close proximity to each other at the level of the third portion of the duodenum. Focal areas of hypervascular blush are noted in the second and third portions of the duodenum which correlate in location to the described areas of angiodysplasia by prior endoscopy. Other SMA supply is normal in appearance without evidence of additional hypervascular abnormalities or AV malformations. Supply to the rest of the small bowel and proximal colon appears normal. Additional selective celiac arteriography demonstrates normal anatomy with normally patent celiac trunk, a widely patent left gastric artery and normally patent common hepatic and splenic arteries. The gastroduodenal artery is the first major branch off of the common hepatic artery and supplies branches to the region of the duodenal bulb and distal stomach. No branches are seen extending to the region of the endo clips and no hypervascular areas in the duodenum are identified with celiac injection. For this reason, embolization was targeted to the pancreaticoduodenal arcade off of the superior mesenteric artery. The pancreaticoduodenal trunk off of the SMA demonstrates a superior branch as well as middle and inferior branches. Based on arteriography, hypervascular areas of abnormality were predominantly supplied by  the superior and inferior branches. The inferior branch was initially catheterized with selective arteriography demonstrating subtle hypervascular abnormality near the 2 adjacent endoscopic clips. This vessel was successfully embolized resulting in occlusion of antegrade flow with proximal injection. The superior pancreaticoduodenal branch supplying the region of the second and third portions of the duodenum was extremely difficult to catheterize due to tortuous course. Ultimately, different catheter systems did allow selective catheterization and coil embolization resulting in cessation of antegrade flow in this branch. Completion arteriography demonstrates markedly reduced vascularity of the duodenum with persistent patent supply via the middle pancreaticoduodenal branch which supplies the region of the third portion of the duodenum. Additional branches supplying the region of the fourth portion of the duodenum also remained normally patent on completion. Adequate hemostasis was achieved at the femoral arteriotomy site. IMPRESSION: The region of abnormal duodenal bleeding due to areas of angiodysplasia visualized and treated by endoscopy and also marked by endoscopic clips were supplied by branches of the pancreaticoduodenal arcade off of the proximal SMA. The gastroduodenal artery did not appear to supply this region by arteriography. Two separate pancreaticoduodenal third order arterial branches were successfully occluded with embolization coils placed via micro catheters. This resulted in decreased vascularity to the second and third portions of the duodenum. Electronically Signed   By: Aletta Edouard M.D.   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 The patient continues to have bleeding requiring transfusion and now presents for arteriography  with possible embolization to treat persistent hemorrhage. EXAM: UTERINE FIBROID EMBOLIZATION ANESTHESIA/SEDATION: 1.5 Mg IV Versed; 75 mcg IV Fentanyl. Total Moderate Sedation Time: 2 hours and 20 minutes. The patient's level of consciousness and physiologic status were continuously monitored during the procedure by Radiology nursing. CONTRAST:  150 mL Isovue-300 FLUOROSCOPY TIME:  58 minutes and 18 seconds. PROCEDURE: The procedure, risks, benefits, and alternatives were explained to the patient. Questions regarding the procedure were encouraged and answered. The patient understands and consents to the procedure. A time-out was performed prior to initiating the procedure. The right groin was prepped with chlorhexidine in a sterile fashion, and a sterile drape was applied covering the operative field. A sterile gown and sterile gloves were used for the procedure. Local anesthesia  was provided with 1% Lidocaine. Ultrasound was used to confirm patency of the right common femoral artery. After small skin incision, a 21 gauge needle was advanced into the right common femoral artery under direct ultrasound guidance. Ultrasound image documentation was performed. After establishing guide wire access, a 5-French sheath was placed. A 5 Fr Cobra catheter was advanced over a guidewire into the distal abdominal aorta. The catheter was then used to selectively catheterize the superior mesenteric artery. Selective arteriography was performed. The Cobra catheter was then advanced into the celiac axis and selective arteriography performed. The Cobra catheter was then reintroduced into the superior mesenteric artery and advanced into the proximal SMA trunk. Additional arteriography was performed. A Renegade STC micro catheter was then advanced through the 5 French catheter and into a third order branch off of the superior mesenteric artery at the level of pancreaticoduodenal supply. Selective arteriography was performed.  Transcatheter embolization was then performed through the micro catheter with introduction of 2 mm x 4 cm, 2 mm x 4 cm and 3 mm x 6 cm Interlock soft coils. Additional arteriography was performed through the micro catheter after embolization of this branch artery. Attempt was made to catheterize an additional third order pancreaticoduodenal branch off of the SMA with the same catheter system. Ultimately, a 30 [place] Sos catheter was used to catheterize the SMA trunk followed by advancement of a Lantern micro catheter. After selective catheterization of a third order pancreaticoduodenal branch, selective arteriography was performed. Transcatheter embolization was then performed of this branch via the micro catheter with introduction of 2 mm x 4 cm, 2 mm x 2 cm, 2 mm x 4 cm and 3 mm x 5 cm Ruby soft coils. Additional arteriography was performed through the micro catheter after embolization. The micro catheter was further retracted and additional arteriography performed at the level of a second order trunk off of the SMA. Catheters were removed. Oblique arteriography centered over the femoral head was performed through the right femoral sheath prior to use of a closure device. Right femoral arteriotomy hemostasis: Cordis Exoseal COMPLICATIONS: None. FINDINGS: Initial first order SMA arteriography demonstrates prominent pancreaticoduodenal vessels off of the first second order trunk of the SMA which extends to the regions of the second, third and fourth portions of the duodenum. There are two endo clips in close proximity to each other at the level of the third portion of the duodenum. Focal areas of hypervascular blush are noted in the second and third portions of the duodenum which correlate in location to the described areas of angiodysplasia by prior endoscopy. Other SMA supply is normal in appearance without evidence of additional hypervascular abnormalities or AV malformations. Supply to the rest of the small bowel  and proximal colon appears normal. Additional selective celiac arteriography demonstrates normal anatomy with normally patent celiac trunk, a widely patent left gastric artery and normally patent common hepatic and splenic arteries. The gastroduodenal artery is the first major branch off of the common hepatic artery and supplies branches to the region of the duodenal bulb and distal stomach. No branches are seen extending to the region of the endo clips and no hypervascular areas in the duodenum are identified with celiac injection. For this reason, embolization was targeted to the pancreaticoduodenal arcade off of the superior mesenteric artery. The pancreaticoduodenal trunk off of the SMA demonstrates a superior branch as well as middle and inferior branches. Based on arteriography, hypervascular areas of abnormality were predominantly supplied by the superior and inferior branches. The  inferior branch was initially catheterized with selective arteriography demonstrating subtle hypervascular abnormality near the 2 adjacent endoscopic clips. This vessel was successfully embolized resulting in occlusion of antegrade flow with proximal injection. The superior pancreaticoduodenal branch supplying the region of the second and third portions of the duodenum was extremely difficult to catheterize due to tortuous course. Ultimately, different catheter systems did allow selective catheterization and coil embolization resulting in cessation of antegrade flow in this branch. Completion arteriography demonstrates markedly reduced vascularity of the duodenum with persistent patent supply via the middle pancreaticoduodenal branch which supplies the region of the third portion of the duodenum. Additional branches supplying the region of the fourth portion of the duodenum also remained normally patent on completion. Adequate hemostasis was achieved at the femoral arteriotomy site. IMPRESSION: The region of abnormal duodenal  bleeding due to areas of angiodysplasia visualized and treated by endoscopy and also marked by endoscopic clips were supplied by branches of the pancreaticoduodenal arcade off of the proximal SMA. The gastroduodenal artery did not appear to supply this region by arteriography. Two separate pancreaticoduodenal third order arterial branches were successfully occluded with embolization coils placed via micro catheters. This resulted in decreased vascularity to the second and third portions of the duodenum. Electronically Signed   By: Aletta Edouard M.D.   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 The patient continues to have bleeding requiring transfusion and now presents for arteriography with possible embolization to treat persistent hemorrhage. EXAM: UTERINE FIBROID EMBOLIZATION ANESTHESIA/SEDATION: 1.5 Mg IV Versed; 75 mcg IV Fentanyl. Total Moderate Sedation Time: 2 hours and 20 minutes. The patient's level of consciousness and physiologic status were continuously monitored during the procedure by Radiology nursing. CONTRAST:  150 mL Isovue-300 FLUOROSCOPY TIME:  58 minutes and 18 seconds. PROCEDURE: The procedure, risks, benefits, and alternatives were explained to the patient. Questions regarding the procedure were encouraged and answered. The patient understands and consents to the procedure. A time-out was performed prior to initiating the procedure. The right groin was prepped with chlorhexidine in a sterile fashion, and a sterile drape was applied covering the operative field. A sterile gown and sterile gloves were used for the procedure. Local anesthesia was provided with 1% Lidocaine. Ultrasound was used to confirm patency of the right common femoral artery. After small skin incision, a 21 gauge needle was advanced into the right common  femoral artery under direct ultrasound guidance. Ultrasound image documentation was performed. After establishing guide wire access, a 5-French sheath was placed. A 5 Fr Cobra catheter was advanced over a guidewire into the distal abdominal aorta. The catheter was then used to selectively catheterize the superior mesenteric artery. Selective arteriography was performed. The Cobra catheter was then advanced into the celiac axis and selective arteriography performed. The Cobra catheter was then reintroduced into the superior mesenteric artery and advanced into the proximal SMA trunk. Additional arteriography was performed. A Renegade STC micro catheter was then advanced through the 5 French catheter and into a third order branch off of the superior mesenteric artery at the level of pancreaticoduodenal supply. Selective arteriography was performed. Transcatheter embolization was then performed through the micro catheter with introduction of 2 mm x 4 cm, 2 mm x 4 cm and 3 mm x 6 cm Interlock soft coils. Additional arteriography was performed through the micro catheter after embolization of this branch artery. Attempt was  made to catheterize an additional third order pancreaticoduodenal branch off of the SMA with the same catheter system. Ultimately, a 9 [place] Sos catheter was used to catheterize the SMA trunk followed by advancement of a Lantern micro catheter. After selective catheterization of a third order pancreaticoduodenal branch, selective arteriography was performed. Transcatheter embolization was then performed of this branch via the micro catheter with introduction of 2 mm x 4 cm, 2 mm x 2 cm, 2 mm x 4 cm and 3 mm x 5 cm Ruby soft coils. Additional arteriography was performed through the micro catheter after embolization. The micro catheter was further retracted and additional arteriography performed at the level of a second order trunk off of the SMA. Catheters were removed. Oblique arteriography centered  over the femoral head was performed through the right femoral sheath prior to use of a closure device. Right femoral arteriotomy hemostasis: Cordis Exoseal COMPLICATIONS: None. FINDINGS: Initial first order SMA arteriography demonstrates prominent pancreaticoduodenal vessels off of the first second order trunk of the SMA which extends to the regions of the second, third and fourth portions of the duodenum. There are two endo clips in close proximity to each other at the level of the third portion of the duodenum. Focal areas of hypervascular blush are noted in the second and third portions of the duodenum which correlate in location to the described areas of angiodysplasia by prior endoscopy. Other SMA supply is normal in appearance without evidence of additional hypervascular abnormalities or AV malformations. Supply to the rest of the small bowel and proximal colon appears normal. Additional selective celiac arteriography demonstrates normal anatomy with normally patent celiac trunk, a widely patent left gastric artery and normally patent common hepatic and splenic arteries. The gastroduodenal artery is the first major branch off of the common hepatic artery and supplies branches to the region of the duodenal bulb and distal stomach. No branches are seen extending to the region of the endo clips and no hypervascular areas in the duodenum are identified with celiac injection. For this reason, embolization was targeted to the pancreaticoduodenal arcade off of the superior mesenteric artery. The pancreaticoduodenal trunk off of the SMA demonstrates a superior branch as well as middle and inferior branches. Based on arteriography, hypervascular areas of abnormality were predominantly supplied by the superior and inferior branches. The inferior branch was initially catheterized with selective arteriography demonstrating subtle hypervascular abnormality near the 2 adjacent endoscopic clips. This vessel was successfully  embolized resulting in occlusion of antegrade flow with proximal injection. The superior pancreaticoduodenal branch supplying the region of the second and third portions of the duodenum was extremely difficult to catheterize due to tortuous course. Ultimately, different catheter systems did allow selective catheterization and coil embolization resulting in cessation of antegrade flow in this branch. Completion arteriography demonstrates markedly reduced vascularity of the duodenum with persistent patent supply via the middle pancreaticoduodenal branch which supplies the region of the third portion of the duodenum. Additional branches supplying the region of the fourth portion of the duodenum also remained normally patent on completion. Adequate hemostasis was achieved at the femoral arteriotomy site. IMPRESSION: The region of abnormal duodenal bleeding due to areas of angiodysplasia visualized and treated by endoscopy and also marked by endoscopic clips were supplied by branches of the pancreaticoduodenal arcade off of the proximal SMA. The gastroduodenal artery did not appear to supply this region by arteriography. Two separate pancreaticoduodenal third order arterial branches were successfully occluded with embolization coils placed via micro catheters. This resulted in  decreased vascularity to the second and third portions of the duodenum. Electronically Signed   By: Aletta Edouard M.D.   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 The patient continues to have bleeding requiring transfusion and now presents for arteriography with possible embolization to treat persistent hemorrhage. EXAM: UTERINE FIBROID EMBOLIZATION ANESTHESIA/SEDATION: 1.5 Mg IV Versed; 75 mcg IV Fentanyl. Total Moderate Sedation Time: 2 hours and 20 minutes. The patient's level of consciousness  and physiologic status were continuously monitored during the procedure by Radiology nursing. CONTRAST:  150 mL Isovue-300 FLUOROSCOPY TIME:  58 minutes and 18 seconds. PROCEDURE: The procedure, risks, benefits, and alternatives were explained to the patient. Questions regarding the procedure were encouraged and answered. The patient understands and consents to the procedure. A time-out was performed prior to initiating the procedure. The right groin was prepped with chlorhexidine in a sterile fashion, and a sterile drape was applied covering the operative field. A sterile gown and sterile gloves were used for the procedure. Local anesthesia was provided with 1% Lidocaine. Ultrasound was used to confirm patency of the right common femoral artery. After small skin incision, a 21 gauge needle was advanced into the right common femoral artery under direct ultrasound guidance. Ultrasound image documentation was performed. After establishing guide wire access, a 5-French sheath was placed. A 5 Fr Cobra catheter was advanced over a guidewire into the distal abdominal aorta. The catheter was then used to selectively catheterize the superior mesenteric artery. Selective arteriography was performed. The Cobra catheter was then advanced into the celiac axis and selective arteriography performed. The Cobra catheter was then reintroduced into the superior mesenteric artery and advanced into the proximal SMA trunk. Additional arteriography was performed. A Renegade STC micro catheter was then advanced through the 5 French catheter and into a third order branch off of the superior mesenteric artery at the level of pancreaticoduodenal supply. Selective arteriography was performed. Transcatheter embolization was then performed through the micro catheter with introduction of 2 mm x 4 cm, 2 mm x 4 cm and 3 mm x 6 cm Interlock soft coils. Additional arteriography was performed through the micro catheter after embolization of this  branch artery. Attempt was made to catheterize an additional third order pancreaticoduodenal branch off of the SMA with the same catheter system. Ultimately, a 23 [place] Sos catheter was used to catheterize the SMA trunk followed by advancement of a Lantern micro catheter. After selective catheterization of a third order pancreaticoduodenal branch, selective arteriography was performed. Transcatheter embolization was then performed of this branch via the micro catheter with introduction of 2 mm x 4 cm, 2 mm x 2 cm, 2 mm x 4 cm and 3 mm x 5 cm Ruby soft coils. Additional arteriography was performed through the micro catheter after embolization. The micro catheter was further retracted and additional arteriography performed at the level of a second order trunk off of the SMA. Catheters were removed. Oblique arteriography centered over the femoral head was performed through the right femoral sheath prior to use of a closure device. Right femoral arteriotomy hemostasis: Cordis Exoseal COMPLICATIONS: None. FINDINGS: Initial first order SMA arteriography demonstrates prominent pancreaticoduodenal vessels off of the first second order trunk of the SMA which extends to the regions of the second, third and fourth portions of the duodenum. There are two endo clips in close proximity to each other at  the level of the third portion of the duodenum. Focal areas of hypervascular blush are noted in the second and third portions of the duodenum which correlate in location to the described areas of angiodysplasia by prior endoscopy. Other SMA supply is normal in appearance without evidence of additional hypervascular abnormalities or AV malformations. Supply to the rest of the small bowel and proximal colon appears normal. Additional selective celiac arteriography demonstrates normal anatomy with normally patent celiac trunk, a widely patent left gastric artery and normally patent common hepatic and splenic arteries. The  gastroduodenal artery is the first major branch off of the common hepatic artery and supplies branches to the region of the duodenal bulb and distal stomach. No branches are seen extending to the region of the endo clips and no hypervascular areas in the duodenum are identified with celiac injection. For this reason, embolization was targeted to the pancreaticoduodenal arcade off of the superior mesenteric artery. The pancreaticoduodenal trunk off of the SMA demonstrates a superior branch as well as middle and inferior branches. Based on arteriography, hypervascular areas of abnormality were predominantly supplied by the superior and inferior branches. The inferior branch was initially catheterized with selective arteriography demonstrating subtle hypervascular abnormality near the 2 adjacent endoscopic clips. This vessel was successfully embolized resulting in occlusion of antegrade flow with proximal injection. The superior pancreaticoduodenal branch supplying the region of the second and third portions of the duodenum was extremely difficult to catheterize due to tortuous course. Ultimately, different catheter systems did allow selective catheterization and coil embolization resulting in cessation of antegrade flow in this branch. Completion arteriography demonstrates markedly reduced vascularity of the duodenum with persistent patent supply via the middle pancreaticoduodenal branch which supplies the region of the third portion of the duodenum. Additional branches supplying the region of the fourth portion of the duodenum also remained normally patent on completion. Adequate hemostasis was achieved at the femoral arteriotomy site. IMPRESSION: The region of abnormal duodenal bleeding due to areas of angiodysplasia visualized and treated by endoscopy and also marked by endoscopic clips were supplied by branches of the pancreaticoduodenal arcade off of the proximal SMA. The gastroduodenal artery did not appear to  supply this region by arteriography. Two separate pancreaticoduodenal third order arterial branches were successfully occluded with embolization coils placed via micro catheters. This resulted in decreased vascularity to the second and third portions of the duodenum. Electronically Signed   By: Aletta Edouard M.D.   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 The patient continues to have bleeding requiring transfusion and now presents for arteriography with possible embolization to treat persistent hemorrhage. EXAM: UTERINE FIBROID EMBOLIZATION ANESTHESIA/SEDATION: 1.5 Mg IV Versed; 75 mcg IV Fentanyl. Total Moderate Sedation Time: 2 hours and 20 minutes. The patient's level of consciousness and physiologic status were continuously monitored during the procedure by Radiology nursing. CONTRAST:  150 mL Isovue-300 FLUOROSCOPY TIME:  58 minutes and 18 seconds. PROCEDURE: The procedure, risks, benefits, and alternatives were explained to the patient. Questions regarding the procedure were encouraged and answered. The patient understands and consents to the procedure. A time-out was performed prior to initiating the procedure. The right groin was prepped with chlorhexidine in a sterile fashion, and a sterile drape was applied covering the operative field. A sterile gown and sterile gloves were used for the procedure. Local anesthesia was provided with 1% Lidocaine. Ultrasound  was used to confirm patency of the right common femoral artery. After small skin incision, a 21 gauge needle was advanced into the right common femoral artery under direct ultrasound guidance. Ultrasound image documentation was performed. After establishing guide wire access, a 5-French sheath was placed. A 5 Fr Cobra catheter was advanced over a guidewire into the distal abdominal aorta. The catheter  was then used to selectively catheterize the superior mesenteric artery. Selective arteriography was performed. The Cobra catheter was then advanced into the celiac axis and selective arteriography performed. The Cobra catheter was then reintroduced into the superior mesenteric artery and advanced into the proximal SMA trunk. Additional arteriography was performed. A Renegade STC micro catheter was then advanced through the 5 French catheter and into a third order branch off of the superior mesenteric artery at the level of pancreaticoduodenal supply. Selective arteriography was performed. Transcatheter embolization was then performed through the micro catheter with introduction of 2 mm x 4 cm, 2 mm x 4 cm and 3 mm x 6 cm Interlock soft coils. Additional arteriography was performed through the micro catheter after embolization of this branch artery. Attempt was made to catheterize an additional third order pancreaticoduodenal branch off of the SMA with the same catheter system. Ultimately, a 50 [place] Sos catheter was used to catheterize the SMA trunk followed by advancement of a Lantern micro catheter. After selective catheterization of a third order pancreaticoduodenal branch, selective arteriography was performed. Transcatheter embolization was then performed of this branch via the micro catheter with introduction of 2 mm x 4 cm, 2 mm x 2 cm, 2 mm x 4 cm and 3 mm x 5 cm Ruby soft coils. Additional arteriography was performed through the micro catheter after embolization. The micro catheter was further retracted and additional arteriography performed at the level of a second order trunk off of the SMA. Catheters were removed. Oblique arteriography centered over the femoral head was performed through the right femoral sheath prior to use of a closure device. Right femoral arteriotomy hemostasis: Cordis Exoseal COMPLICATIONS: None. FINDINGS: Initial first order SMA arteriography demonstrates prominent  pancreaticoduodenal vessels off of the first second order trunk of the SMA which extends to the regions of the second, third and fourth portions of the duodenum. There are two endo clips in close proximity to each other at the level of the third portion of the duodenum. Focal areas of hypervascular blush are noted in the second and third portions of the duodenum which correlate in location to the described areas of angiodysplasia by prior endoscopy. Other SMA supply is normal in appearance without evidence of additional hypervascular abnormalities or AV malformations. Supply to the rest of the small bowel and proximal colon appears normal. Additional selective celiac arteriography demonstrates normal anatomy with normally patent celiac trunk, a widely patent left gastric artery and normally patent common hepatic and splenic arteries. The gastroduodenal artery is the first major branch off of the common hepatic artery and supplies branches to the region of the duodenal bulb and distal stomach. No branches are seen extending to the region of the endo clips and no hypervascular areas in the duodenum are identified with celiac injection. For this reason, embolization was targeted to the pancreaticoduodenal arcade off of the superior mesenteric artery. The pancreaticoduodenal trunk off of the SMA demonstrates a superior branch as well as middle and inferior branches. Based on arteriography, hypervascular areas of abnormality were predominantly supplied by the superior and inferior branches. The inferior branch was initially catheterized with  selective arteriography demonstrating subtle hypervascular abnormality near the 2 adjacent endoscopic clips. This vessel was successfully embolized resulting in occlusion of antegrade flow with proximal injection. The superior pancreaticoduodenal branch supplying the region of the second and third portions of the duodenum was extremely difficult to catheterize due to tortuous course.  Ultimately, different catheter systems did allow selective catheterization and coil embolization resulting in cessation of antegrade flow in this branch. Completion arteriography demonstrates markedly reduced vascularity of the duodenum with persistent patent supply via the middle pancreaticoduodenal branch which supplies the region of the third portion of the duodenum. Additional branches supplying the region of the fourth portion of the duodenum also remained normally patent on completion. Adequate hemostasis was achieved at the femoral arteriotomy site. IMPRESSION: The region of abnormal duodenal bleeding due to areas of angiodysplasia visualized and treated by endoscopy and also marked by endoscopic clips were supplied by branches of the pancreaticoduodenal arcade off of the proximal SMA. The gastroduodenal artery did not appear to supply this region by arteriography. Two separate pancreaticoduodenal third order arterial branches were successfully occluded with embolization coils placed via micro catheters. This resulted in decreased vascularity to the second and third portions of the duodenum. Electronically Signed   By: Aletta Edouard M.D.   On: 04/22/2016 14:57   Dg Chest Port 1 View  04/17/2016  CLINICAL DATA:  Pain between the shoulder blades and in the chest for 2 hours. Heart monitor. EXAM: PORTABLE CHEST 1 VIEW COMPARISON:  04/09/2016 FINDINGS: Normal heart size and pulmonary vascularity. No focal airspace disease or consolidation in the lungs. No blunting of costophrenic angles. No pneumothorax. Mediastinal contours appear intact. Degenerative changes in the spine and shoulders. Calcified and tortuous aorta. IMPRESSION: No active disease. Electronically Signed   By: Lucienne Capers M.D.   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 The patient continues to have bleeding requiring transfusion and now presents for arteriography with possible embolization to treat persistent hemorrhage. EXAM: UTERINE FIBROID EMBOLIZATION ANESTHESIA/SEDATION: 1.5 Mg IV Versed; 75 mcg IV Fentanyl. Total Moderate Sedation Time: 2 hours and 20 minutes. The patient's level of consciousness and physiologic status were continuously monitored during the procedure by Radiology nursing. CONTRAST:  150 mL Isovue-300 FLUOROSCOPY TIME:  58 minutes and 18 seconds. PROCEDURE: The procedure, risks, benefits, and alternatives were explained to the patient. Questions regarding the procedure were encouraged and answered. The patient understands and consents to the procedure. A time-out was performed prior to initiating the procedure. The right groin was prepped with chlorhexidine in a sterile fashion, and a sterile drape was applied covering the operative field. A sterile gown and sterile gloves were used for the procedure. Local anesthesia was provided with 1% Lidocaine. Ultrasound was used to confirm patency of the right common femoral artery. After small skin incision, a 21 gauge needle was advanced into the right common femoral artery under direct ultrasound guidance. Ultrasound image documentation was performed. After establishing guide wire access, a 5-French sheath was placed. A 5 Fr Cobra catheter was advanced over a guidewire into the distal abdominal aorta. The catheter was then used to selectively catheterize the superior mesenteric artery. Selective arteriography was performed. The Cobra catheter was then advanced into the celiac axis and selective arteriography performed. The Cobra catheter was then reintroduced into the superior mesenteric artery and advanced  into the proximal SMA trunk. Additional arteriography was performed. A Renegade STC micro catheter was then advanced through the 5 French catheter and into a third order branch off of the superior  mesenteric artery at the level of pancreaticoduodenal supply. Selective arteriography was performed. Transcatheter embolization was then performed through the micro catheter with introduction of 2 mm x 4 cm, 2 mm x 4 cm and 3 mm x 6 cm Interlock soft coils. Additional arteriography was performed through the micro catheter after embolization of this branch artery. Attempt was made to catheterize an additional third order pancreaticoduodenal branch off of the SMA with the same catheter system. Ultimately, a 46 [place] Sos catheter was used to catheterize the SMA trunk followed by advancement of a Lantern micro catheter. After selective catheterization of a third order pancreaticoduodenal branch, selective arteriography was performed. Transcatheter embolization was then performed of this branch via the micro catheter with introduction of 2 mm x 4 cm, 2 mm x 2 cm, 2 mm x 4 cm and 3 mm x 5 cm Ruby soft coils. Additional arteriography was performed through the micro catheter after embolization. The micro catheter was further retracted and additional arteriography performed at the level of a second order trunk off of the SMA. Catheters were removed. Oblique arteriography centered over the femoral head was performed through the right femoral sheath prior to use of a closure device. Right femoral arteriotomy hemostasis: Cordis Exoseal COMPLICATIONS: None. FINDINGS: Initial first order SMA arteriography demonstrates prominent pancreaticoduodenal vessels off of the first second order trunk of the SMA which extends to the regions of the second, third and fourth portions of the duodenum. There are two endo clips in close proximity to each other at the level of the third portion of the duodenum. Focal areas of hypervascular blush are noted in the second and third portions of the duodenum which correlate in location to the described areas of angiodysplasia by prior endoscopy. Other SMA supply is normal in appearance without evidence  of additional hypervascular abnormalities or AV malformations. Supply to the rest of the small bowel and proximal colon appears normal. Additional selective celiac arteriography demonstrates normal anatomy with normally patent celiac trunk, a widely patent left gastric artery and normally patent common hepatic and splenic arteries. The gastroduodenal artery is the first major branch off of the common hepatic artery and supplies branches to the region of the duodenal bulb and distal stomach. No branches are seen extending to the region of the endo clips and no hypervascular areas in the duodenum are identified with celiac injection. For this reason, embolization was targeted to the pancreaticoduodenal arcade off of the superior mesenteric artery. The pancreaticoduodenal trunk off of the SMA demonstrates a superior branch as well as middle and inferior branches. Based on arteriography, hypervascular areas of abnormality were predominantly supplied by the superior and inferior branches. The inferior branch was initially catheterized with selective arteriography demonstrating subtle hypervascular abnormality near the 2 adjacent endoscopic clips. This vessel was successfully embolized resulting in occlusion of antegrade flow with proximal injection. The superior pancreaticoduodenal branch supplying the region of the second and third portions of the duodenum was extremely difficult to catheterize due to tortuous course. Ultimately, different catheter systems did allow selective catheterization and coil embolization resulting in cessation of antegrade flow in this branch. Completion arteriography demonstrates markedly reduced vascularity of the duodenum with persistent patent supply via the middle pancreaticoduodenal branch which supplies the region of the third portion of the duodenum. Additional branches supplying the  region of the fourth portion of the duodenum also remained normally patent on completion. Adequate  hemostasis was achieved at the femoral arteriotomy site. IMPRESSION: The region of abnormal duodenal bleeding due to areas of angiodysplasia visualized and treated by endoscopy and also marked by endoscopic clips were supplied by branches of the pancreaticoduodenal arcade off of the proximal SMA. The gastroduodenal artery did not appear to supply this region by arteriography. Two separate pancreaticoduodenal third order arterial branches were successfully occluded with embolization coils placed via micro catheters. This resulted in decreased vascularity to the second and third portions of the duodenum. Electronically Signed   By: Aletta Edouard M.D.   On: 04/22/2016 14:57    Labs:  CBC:  Recent Labs  04/24/16 0224  04/27/16 0807  04/28/16 0728 04/28/16 1526 04/29/16 0521 04/30/16 0614  WBC 6.9  --  4.9  --  5.5  --   --  5.4  HGB 7.4*  < > 7.1*  < > 6.6* 7.9* 8.3* 7.6*  HCT 23.4*  < > 22.5*  < > 20.8* 25.4* 25.2* 23.2*  PLT 151  --  195  --  193  --   --  199  < > = values in this interval not displayed.  COAGS:  Recent Labs  04/16/16 2324 04/21/16 0911 04/22/16 0340 04/25/16 0822  INR 1.22 1.15 1.12 1.10    BMP:  Recent Labs  04/25/16 0822 04/27/16 0807 04/28/16 0725 04/30/16 0614  NA 138 141 141 140  K 4.3 4.2 4.8 4.1  CL 105 106 107 108  CO2 26 24 26 26   GLUCOSE 105* 91 96 83  BUN 25* 24* 25* 28*  CALCIUM 7.9* 8.2* 8.0* 7.9*  CREATININE 0.96 0.84 0.99 0.94  GFRNONAA 54* >60 52* 56*  GFRAA >60 >60 >60 >60    LIVER FUNCTION TESTS:  Recent Labs  10/05/15 1504 04/09/16 1646 04/16/16 2324 04/22/16 0340  BILITOT 0.3 0.2* 0.2* 0.5  AST 14 17 21 16   ALT 10 11* 15 12*  ALKPHOS 52 39 39 36*  PROT 6.6 5.5* 5.3* 4.6*  ALBUMIN 3.9 3.1* 2.9* 2.3*    TUMOR MARKERS: No results for input(s): AFPTM, CEA, CA199, CHROMGRNA in the last 8760 hours.  Assessment and Plan:  GI Bleed Pancreaticoduodenal arteries embolization 04/22/16 Continues to have decrease of  Hgb despite transfusions Capsule endoscopy no bleeding 5/14 5/14 NM study does show active bleed Now for repeat mesenteric arteriogram with possible embolization in IR Risks and Benefits discussed with the patient including, but not limited to bleeding, infection, vascular injury or contrast induced renal failure. All of the patient's questions were answered, patient is agreeable to proceed. Consent signed and in chart.  Thank you for this interesting consult.  I greatly enjoyed meeting Nicole Perez and look forward to participating in their care.  A copy of this report was sent to the requesting provider on this date.  Electronically Signed: Jayshon Dommer A 04/30/2016, 9:36 AM   I spent a total of 40 Minutes    in face to face in clinical consultation, greater than 50% of which was counseling/coordinating care for mesenteric arteriogram with poss embolization

## 2016-04-30 NOTE — Progress Notes (Signed)
PT Cancellation Note  Patient Details Name: Nicole Perez MRN: [id number] DOB: [birth year]   Cancelled Treatment:    Reason Eval/Treat Not Completed: Medical issues which prohibited therapy;Patient at procedure or test/unavailable.  Nuclear medicine, will ck later as time and pt allow.   Ramond Dial 04/30/2016, 11:58 AM    Mee Hives, PT MS Acute Rehab Dept. Number: [facility] [id number] and [place] [phone]

## 2016-04-30 NOTE — Sedation Documentation (Signed)
Patient is resting comfortably. No complaints at this time, vital signs stable. 

## 2016-04-30 NOTE — Sedation Documentation (Signed)
Vital signs stable. 

## 2016-04-30 NOTE — Sedation Documentation (Signed)
Received report from Tyrone Nine, RN.

## 2016-04-30 NOTE — Sedation Documentation (Signed)
Patient is resting comfortably. No complaints at this time. Pt vitals stable.

## 2016-04-30 NOTE — Progress Notes (Cosign Needed)
Daily Rounding Note  04/30/2016, 8:15 AM  LOS: 13 days   SUBJECTIVE:       Events of weekend noted. Stools still black as of yesterday.  She is getting fed up.  No dyspnea.  On going epigastric discomfort despite BID PPI and scheduled Carafate.  No nausea.   OBJECTIVE:         Vital signs in last 24 hours:    Temp:  [97.7 F (36.5 C)] 97.7 F (36.5 C) (05/15 0501) Pulse Rate:  [47-70] 70 (05/15 0501) Resp:  [18] 18 (05/14 2128) BP: (111-115)/(54-68) 115/54 mmHg (05/15 0501) SpO2:  [98 %-99 %] 98 % (05/15 0501) Weight:  [71.4 kg (157 lb 6.5 oz)] 71.4 kg (157 lb 6.5 oz) (05/15 0227) Last BM Date: 04/29/16 Filed Weights   04/28/16 0237 04/29/16 0602 04/30/16 0227  Weight: 68.7 kg (151 lb 7.3 oz) 72.4 kg (159 lb 9.8 oz) 71.4 kg (157 lb 6.5 oz)   General: pleasant, comfortable   Heart: RRR Chest: clear bil.   Abdomen: soft, NT, ND.  Active BS  Extremities: large hematoma on right forearm.   Neuro/Psych:  Cooperative, calm, less gregarious (she's frustrated).  No gross deficits.   Intake/Output from previous day: 05/14 0701 - 05/15 0700 In: 3 [I.V.:3] Out: 451 [Urine:450; Stool:1]  Intake/Output this shift:    Lab Results:  Recent Labs  04/28/16 0728 04/28/16 1526 04/29/16 0521 04/30/16 0614  WBC 5.5  --   --  5.4  HGB 6.6* 7.9* 8.3* 7.6*  HCT 20.8* 25.4* 25.2* 23.2*  PLT 193  --   --  199   BMET  Recent Labs  04/28/16 0725  NA 141  K 4.8  CL 107  CO2 26  GLUCOSE 96  BUN 25*  CREATININE 0.99  CALCIUM 8.0*   LFT No results for input(s): PROT, ALBUMIN, AST, ALT, ALKPHOS, BILITOT, BILIDIR, IBILI in the last 72 hours. PT/INR No results for input(s): LABPROT, INR in the last 72 hours.  Studies/Results: Nm Gi Blood Loss  04/29/2016  CLINICAL DATA:  GI bleed mid small bowel by capsule endoscopy. EXAM: NUCLEAR MEDICINE GASTROINTESTINAL BLEEDING SCAN TECHNIQUE: Sequential abdominal images were  obtained following intravenous administration of Tc-53m labeled red blood cells. RADIOPHARMACEUTICALS:  27.4 mCi Tc-44m in-vitro labeled red cells. COMPARISON:  Nuclear medicine bleeding scan 04/20/2016 FINDINGS: Images are obtained for 2 hours. Satisfactory red blood cell tagging Faint accumulation of isotope is present in the mid abdomen bilaterally toward the end of the study. This is apparent beginning at approximately 1 hour and shows progressive accumulation of isotope compatible with active bleeding. No colonic activity identified. IMPRESSION: Findings consistent with active bleeding in the small bowel. Electronically Signed   By: Franchot Gallo M.D.   On: 04/29/2016 16:25   Scheduled Meds: . antiseptic oral rinse  7 mL Mouth Rinse BID  . [START ON 05/01/2016] pantoprazole  40 mg Oral Daily  . sodium chloride flush  3 mL Intravenous Q12H   Continuous Infusions:  PRN Meds:.acetaminophen **OR** acetaminophen, alum & mag hydroxide-simeth, HYDROcodone-acetaminophen, magnesium hydroxide, MUSCLE RUB, ondansetron **OR** ondansetron (ZOFRAN) IV, phenol, polyethylene glycol  ASSESMENT:   *  Recurrent SB GI bleed.    04/11/15 EGD.  Normal esophagus. Normal stomach. Normal duodenal bulb. Four non-bleeding angiodysplastic lesions in the duodenum. Treated with argon plasma coagulation (APC). Two non-bleeding angiodysplastic lesions in the duodenum. 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 Treated with APC and clips placed.  5/5/7 RBC scan bleeding in region duodenum at site of clips 04/22/16 embolization of GDA 04/27/16 Capsule endoscopy: Active bleeding, beyond reach of enteroscopy 04/29/16 RBC scan.  Active SB bleeding.   *  Blood loss anemia.  S/p 9 to 10  PRBCs.  Latest 2 units on 5/13 for Hgb 6.6; 7.9 to 8.3 to 7.6 today.     PLAN   *  Plan as of this  AM is attempt at repeat IR embolization. Fortunately she has good kidney function.  *  Carafate is of no benefit at this point for a SB AVM bleed and it is making no dent in her epigastric discomfort, I discontinued this.  Q day Protonix should be sufficient.   *  A cc of the capsule report will be placed in shadow chart.    Nicole Perez  04/30/2016, 8:15 AM Pager: [phone]

## 2016-04-30 NOTE — Progress Notes (Signed)
Patient ID: Nicole Perez, female   DOB: 16-May-1933, 82 y.o.   MRN: [id number] Noted GI plan for TF to [name] for double baloon endoscopy if tag RBC scan positive. We are available if needed. Georganna Skeans, MD, MPH, FACS Trauma: [phone] General Surgery: [phone]

## 2016-04-30 NOTE — Sedation Documentation (Signed)
Vital signs stable. Pt has no complaints at this time. 

## 2016-04-30 NOTE — Sedation Documentation (Signed)
Patient denies pain and is resting comfortably after pain meds for general positional discomfort.

## 2016-05-01 LAB — BASIC METABOLIC PANEL
Anion gap: 10 (ref 5–15)
BUN: 25 mg/dL — AB (ref 6–20)
CALCIUM: 8.2 mg/dL — AB (ref 8.9–10.3)
CO2: 24 mmol/L (ref 22–32)
CREATININE: 0.89 mg/dL (ref 0.44–1.00)
Chloride: 106 mmol/L (ref 101–111)
GFR calc Af Amer: >60 mL/min (ref >60)
GFR, EST NON AFRICAN AMERICAN: 60 mL/min — AB (ref >60)
GLUCOSE: 96 mg/dL (ref 65–99)
Potassium: 4.3 mmol/L (ref 3.5–5.1)
Sodium: 140 mmol/L (ref 135–145)

## 2016-05-01 LAB — HEMOGLOBIN AND HEMATOCRIT, BLOOD
HCT: 21.6 % — ABNORMAL LOW (ref 36.0–46.0)
Hemoglobin: 7 g/dL — ABNORMAL LOW (ref 12.0–15.0)

## 2016-05-01 LAB — CBC
HCT: 21.6 % — ABNORMAL LOW (ref 36.0–46.0)
Hemoglobin: 7 g/dL — ABNORMAL LOW (ref 12.0–15.0)
MCH: 28.6 pg (ref 26.0–34.0)
MCHC: 32.4 g/dL (ref 30.0–36.0)
MCV: 88.2 fL (ref 78.0–100.0)
PLATELETS: 236 10*3/uL (ref 150–400)
RBC: 2.45 MIL/uL — AB (ref 3.87–5.11)
RDW: 17 % — AB (ref 11.5–15.5)
WBC: 7.4 10*3/uL (ref 4.0–10.5)

## 2016-05-01 LAB — PROTIME-INR
INR: 1.16 (ref 0.00–1.49)
PROTHROMBIN TIME: 15 s (ref 11.6–15.2)

## 2016-05-01 MED ORDER — POLYETHYLENE GLYCOL 3350 17 G PO PACK: 17.0000 g | PACK | Freq: Two times a day (BID) | ORAL | Status: DC | PRN

## 2016-05-01 NOTE — Progress Notes (Signed)
[facility] Progress Note  3 Days Post-Op  Subjective: Patient is tearful this morning because she was told she's still bleeding and needs to be transferred to [place] frustrated.  She says she hasn't had any bloody BM's since yesterday.  She's tolerating clears this morning.  Good flatus.  Still has epigastric abdominal pain.  No SOB/CP.    Objective: Vital signs in last 24 hours: Temp:  [98.4 F (36.9 C)-99 F (37.2 C)] 99 F (37.2 C) (05/16 0453) Pulse Rate:  [58-94] 58 (05/16 0453) Resp:  [6-20] 16 (05/16 0453) BP: (110-150)/(47-82) 127/50 mmHg (05/16 0453) SpO2:  [98 %-100 %] 98 % (05/16 0453) Weight:  [151 lb 12.8 oz (68.856 kg)] 151 lb 12.8 oz (68.856 kg) (05/16 0456) Last BM Date: 04/29/16  Intake/Output from previous day: 05/15 0701 - 05/16 0700 In: -  Out: 950 [Urine:950] Intake/Output this shift:    PE: Gen:  Alert, NAD, pleasant Abd: Soft, ND, tender in epigastrium, +BS, no HSM  Lab Results:   Recent Labs  04/30/16 0614 05/01/16 0825  WBC 5.4 7.4  HGB 7.6* 7.0*  HCT 23.2* 21.6*  PLT 199 236   BMET  Recent Labs  04/30/16 0614 05/01/16 0825  NA 140 140  K 4.1 4.3  CL 108 106  CO2 26 24  GLUCOSE 83 96  BUN 28* 25*  CREATININE 0.94 0.89  CALCIUM 7.9* 8.2*   PT/INR  Recent Labs  04/30/16 0947  LABPROT 14.8  INR 1.14   CMP     Component Value Date/Time   NA 140 05/01/2016 0825   NA 143 11/05/2013 1437   K 4.3 05/01/2016 0825   CL 106 05/01/2016 0825   CO2 24 05/01/2016 0825   GLUCOSE 96 05/01/2016 0825   GLUCOSE 99 11/05/2013 1437   BUN 25* 05/01/2016 0825   BUN 14 11/05/2013 1437   CREATININE 0.89 05/01/2016 0825   CREATININE 0.78 04/16/2016 1649   CALCIUM 8.2* 05/01/2016 0825   PROT 4.6* 04/22/2016 0340   PROT 6.9 11/24/2013 1257   ALBUMIN 2.3* 04/22/2016 0340   ALBUMIN 4.2 11/05/2013 1437   AST 16 04/22/2016 0340   ALT 12* 04/22/2016 0340   ALKPHOS 36* 04/22/2016 0340   BILITOT 0.5 04/22/2016 0340    GFRNONAA 60* 05/01/2016 0825   GFRNONAA 72 04/16/2016 1649   GFRAA >60 05/01/2016 0825   GFRAA 83 04/16/2016 1649   Lipase  No results found for: LIPASE   Angio results noted   Anti-infectives: Anti-infectives    Start     Dose/Rate Route Frequency Ordered Stop   04/19/16 1400  cephALEXin (KEFLEX) capsule 500 mg  Status:  Discontinued     500 mg Oral Every 8 hours 04/19/16 0954 04/24/16 0820   04/17/16 0700  cefTRIAXone (ROCEPHIN) 1 g in dextrose 5 % 50 mL IVPB  Status:  Discontinued     1 g 100 mL/hr over 30 Minutes Intravenous Every 24 hours 04/17/16 0658 04/19/16 0954     Bleeding interventions: 04/11/15 EGD.  Normal esophagus. Normal stomach. Normal duodenal bulb. Four non-bleeding angiodysplastic lesions in the duodenum. Treated with argon plasma coagulation (APC). Two non-bleeding angiodysplastic lesions in the duodenum. Treated with argon plasma coagulation  ANGIODYSPLASIA AS THE CAUSE FOR HER RECURRENT MELENA 04/18/16 entersocopy; non-bleeding ulcer within duodenum at site of previous APC ablation. Treated with APC and clips placed.  5/5/7 RBC scan bleeding in region duodenum at site of clips 04/22/16 embolization of GDA 04/27/16 Capsule endoscopy: Active bleeding,  beyond reach of enteroscopy 04/29/16 RBC scan. Active SB bleeding.  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 -Surgical options are limited to pancreaticoduodenectomy. Very high risk at her age.  Hopefully with the second angio we will see improvements in her bleeding.  Hgb 7.0, Hct 21.6 today.  The patient doesn't want surgery -It's hard to tell if the bleeding has stopped at this point, trending CBC's, but avoiding excessive lab draws given ongoing  anemia -Decision for transfer to [facility] per GI and FMTS. -Please call us as needed    LOS: 14 days    Nat Christen 05/01/2016, 9:12 AM Pager: [phone]  (7am - 4:30pm M-F; 7am - 11:30am Sa/Su)

## 2016-05-01 NOTE — Progress Notes (Signed)
Family Medicine Teaching Service Daily Progress Note Intern Pager: [phone]  Patient name: Nicole Perez Medical record number: [id number] Date of birth: 09-15-1933 Age: 82 y.o. 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 5/6 Hgb dropped to 7 and transfused 1 unit pRBCs 5/7 Embolization of pancreaticoduodenal arteries by IR 5/7 Hgb 7.2, and transfused 1u pRBC 5/10 Hgb dropped to 6.6, and transfused 2u pRBC  5/13 Capsule endoscopy: bleeding from small intestine, to far to reach with endoscopy 5/13 Hgb dropped to 6.6, and transfused 1u pRBC 5/14 Tagged RBC with faint small bowel bleeding (not well localized) 5/15 Visceral Angiogram; branch of SMA embolized per IR  Assessment and Plan: Nicole Perez is a 82 y.o. female presenting with symptomatic anemia. PMH is significant for recent hospitalization for anemia/ GI bleed/ Angiodysplasia, hypertension, neurogenic bladder and OA  Symptomatic anemia 2/2 GI bleed: Continued to drop Hgb. Hgb 5.5 on admission>>8u>7.9>8.3>7.6>7.0 now. Denies any melanotic stool overnight or this morning. - GI on board and appreciate recs - Surgery following. Appreciate recs - IR following.   -Angiography yesterday, branch of SMA embolized  - Protonix 40 mg daily - VS per floor protocol - Hgb 7 this AM. Will repeat H&H 24 hours after embolization procedure (1500 today), if hgb decreases < 7 then we will tranfuse and work on  transfer to [facility].   Epigastric Abdominal Pain: improved. Mild TTP over epigastric areas. - Continue PPI - Also on MOM as needed  Chest pain: resolved. ACS ruled out with serial trop and EKG  - Appreciate card recs, Nuclear scan as outpt - will avoid ASA in the setting of current GI bleed  HTN: Holding for now due to blood pressures. lisinopril and HCTZ at home - Holding home meds - Monitor BP  Bradycardia: HR has been in 50-60's at times. Had history of junctional rhythm. On 30 day monitor before admission. - Continue to monitor HR  Neurogenic bladder/UTI: self caths at home. S/p CTX 5/2>5/4 and Keflex 5/4>5/8 - I &O cath as needed   Osteoarthritis: Foot and leg pain. History of back & LE OA. On Lorcet at home - [place] 10/325 mg twice a day  - Avoid NSAIDs in the setting of GI bleed - Bowel regimen (miralax and MOM)  FEN/GI:  -clear liquid  Prophylaxis: SCDs in the setting of GI bleeding  Disposition: continue regular floor, s/p embolization of branch of SMA. May need [facility] transfer if hgb continues to drop  Subjective:  Patient has some epigastric pain this morning, not worse than before. States that she has not had a BM since yesterday. Yesterday she had a very small BM, she cannot remember if it was melanotic or not. Admits to feeling tired but no dizziness or lightheadedness. She is still agreeable to going to [facility] if needed.   Objective: Temp:  [98.4 F (36.9 C)-99 F (37.2 C)] 99 F (37.2 C) (05/16 0453) Pulse Rate:  [58-94] 58 (05/16 0453) Resp:  [  6-20] 16 (05/16 0453) BP: (110-150)/(47-82) 127/50 mmHg (05/16 0453) SpO2:  [98 %-100 %] 98 % (05/16 0453) Weight:  [151 lb 12.8 oz (68.856 kg)] 151 lb 12.8 oz (68.856 kg) (05/16 0456) Physical Exam: GEN: laying in bed, in NAD CVS: RRR, normal s1 and s2, no murmurs, no edema RESP: normal work of breathing, no crackles or wheeze GI: normal bowel sound, soft, no tenderness to palpation,  non-distended NEURO: alert and oriented, no gross defecits  Laboratory:  Recent Labs Lab 04/27/16 0807  04/28/16 0728 04/28/16 1526 04/29/16 0521 04/30/16 0614  WBC 4.9  --  5.5  --   --  5.4  HGB 7.1*  < > 6.6* 7.9* 8.3* 7.6*  HCT 22.5*  < > 20.8* 25.4* 25.2* 23.2*  PLT 195  --  193  --   --  199  < > = values in this interval not displayed.  Recent Labs Lab 04/27/16 0807 04/28/16 0725 04/30/16 0614  NA 141 141 140  K 4.2 4.8 4.1  CL 106 107 108  CO2 24 26 26   BUN 24* 25* 28*  CREATININE 0.84 0.99 0.94  CALCIUM 8.2* 8.0* 7.9*  GLUCOSE 91 96 83    Imaging/Diagnostic Tests: No results found.    Carlyle Dolly, MD 05/01/2016, 7:23 AM PGY-1, [place] Intern pager: [phone], text pages welcome

## 2016-05-01 NOTE — Progress Notes (Signed)
MD returned call. Labs are to be drawn as soon as possible for blood administration. Will continue to monitor pt and report to MD as needed.

## 2016-05-01 NOTE — Progress Notes (Signed)
Lab notified this nurse that they had 3 unsuccessful attempts to draw PT, H&H. MD paged, awaiting return call.

## 2016-05-01 NOTE — Progress Notes (Signed)
PT Cancellation Note  Patient Details Name: Nicole Perez MRN: [id number] DOB: February 22, 1933   Cancelled Treatment:    Reason Eval/Treat Not Completed: Patient declined, no reason specifiedPt declined any mobility due to c/o pain. PT will check on pt later as time allows.    Salina April, PTA Pager: [phone]   05/01/2016, 12:08 PM

## 2016-05-01 NOTE — Progress Notes (Signed)
Daily Rounding Note  05/01/2016, 9:02 AM  LOS: 14 days   SUBJECTIVE:       Feels ok.  No stools or BPR/melena since before IR embolization.  Still has epigastric discomfort.  No nausea.  Has not yet been out of bed.    OBJECTIVE:         Vital signs in last 24 hours:    Temp:  [98.4 F (36.9 C)-99 F (37.2 C)] 99 F (37.2 C) (05/16 0453) Pulse Rate:  [58-94] 58 (05/16 0453) Resp:  [6-20] 16 (05/16 0453) BP: (110-150)/(47-82) 127/50 mmHg (05/16 0453) SpO2:  [98 %-100 %] 98 % (05/16 0453) Weight:  [68.856 kg (151 lb 12.8 oz)] 68.856 kg (151 lb 12.8 oz) (05/16 0456) Last BM Date: 04/29/16 Filed Weights   04/29/16 0602 04/30/16 0227 05/01/16 0456  Weight: 72.4 kg (159 lb 9.8 oz) 71.4 kg (157 lb 6.5 oz) 68.856 kg (151 lb 12.8 oz)   General: pleasant, looks tired but not ill   Heart: RRR Chest: clear bil.  Non-labored breathing.  Abdomen: soft,ND.  Tenderness without guard or rebound in epigastric area.   Clean gauze bandage in right groin, no regional bruising or evidence of bleeding.  Extremities: no CCE.   Neuro/Psych:  Pleasant, alert.  No gross deficits  Intake/Output from previous day: 05/15 0701 - 05/16 0700 In: -  Out: 950 [Urine:950]  Intake/Output this shift:    Lab Results:  Recent Labs  04/29/16 0521 04/30/16 0614 05/01/16 0825  WBC  --  5.4 7.4  HGB 8.3* 7.6* 7.0*  HCT 25.2* 23.2* 21.6*  PLT  --  199 236   BMET  Recent Labs  04/30/16 0614  NA 140  K 4.1  CL 108  CO2 26  GLUCOSE 83  BUN 28*  CREATININE 0.94  CALCIUM 7.9*   LFT No results for input(s): PROT, ALBUMIN, AST, ALT, ALKPHOS, BILITOT, BILIDIR, IBILI in the last 72 hours. PT/INR  Recent Labs  04/30/16 0947  LABPROT 14.8  INR 1.14   Hepatitis Panel No results for input(s): HEPBSAG, HCVAB, HEPAIGM, HEPBIGM in the last 72 hours.  Studies/Results: Nm Gi Blood Loss  04/29/2016  CLINICAL DATA:  GI bleed mid small  bowel by capsule endoscopy. EXAM: NUCLEAR MEDICINE GASTROINTESTINAL BLEEDING SCAN TECHNIQUE: Sequential abdominal images were obtained following intravenous administration of Tc-79mlabeled red blood cells. RADIOPHARMACEUTICALS:  27.4 mCi Tc-921mn-vitro labeled red cells. COMPARISON:  Nuclear medicine bleeding scan 04/20/2016 FINDINGS: Images are obtained for 2 hours. Satisfactory red blood cell tagging Faint accumulation of isotope is present in the mid abdomen bilaterally toward the end of the study. This is apparent beginning at approximately 1 hour and shows progressive accumulation of isotope compatible with active bleeding. No colonic activity identified. IMPRESSION: Findings consistent with active bleeding in the small bowel. Electronically Signed   By: ChFranchot Gallo.D.   On: 04/29/2016 16:25    Ir Angiogram Visceral Selective Ir Angiogram Selective Each Additional Vessel Ir Angiogram Follow Up Study Ir [place] Vasc Access Right Ir Embo Art  [place]  05/01/2016  INDICATION: History of refractory upper GI bleeding due to duodenal angiodysplasia, post technically successful percutaneous coil embolization of 2 separate pancreaticoduodenal third order arterial branches on 04/22/2016. Unfortunately, the patient has experienced persistent upper GI bleed with capsule endoscopy suggesting persistent bleeding within the duodenum. As patient is a poor operative candidate, request made for repeat catheter  directed angiography and potential percutaneous coil embolization. EXAM: 1. ULTRASOUND GUIDANCE FOR ARTERIAL ACCESS 2. SUPERIOR MESENTERIC ARTERIOGRAM 3. CELIAC ARTERIOGRAM (3rd ORDER) 4. SELECTIVE COMMON HEPATIC ARTERIOGRAM 5. SELECTIVE 3rd ORDER ANGIOGRAM AND PERCUTANEOUS COIL EMBOLIZATION OF REMAINING PANCREATICODUODENAL SUPPLY FROM THE SUPERIOR MESENTERIC ARTERY. 6. SELECTIVE 3rd ORDER GASTRODUODENAL ARTERIOGRAM AND PERCUTANEOUS COIL EMBOLIZATION. 7. FOLLOW-UP  ARTERIOGRAPHY AFTER EMBOLIZATION MEDICATIONS: None ANESTHESIA/SEDATION: Moderate (conscious) sedation was employed during this procedure. A total of Versed 2 mg and Fentanyl 150 mcg was administered intravenously. Moderate Sedation Time: 156 minutes. The patient's level of consciousness and vital signs were monitored continuously by radiology nursing throughout the procedure under my direct supervision. CONTRAST:  100 cc Isovue-300 FLUOROSCOPY TIME:  Fluoroscopy Time: 22 minutes 6 seconds (2121 mGy). COMPLICATIONS: None immediate. PROCEDURE: Informed consent was obtained from the patient following explanation of the procedure, risks, benefits and alternatives. The patient understands, agrees and consents for the procedure. All questions were addressed. A time out was performed prior to the initiation of the procedure. Maximal barrier sterile technique utilized including caps, mask, sterile gowns, sterile gloves, large sterile drape, hand hygiene, and Betadine prep. The right femoral head was marked fluoroscopically. Under ultrasound guidance, the right common femoral artery was accessed with a micropuncture kit after the overlying soft tissues were anesthetized with 1% lidocaine. An ultrasound image was saved for documentation purposes. The micropuncture sheath was exchanged for a 5 [place] vascular sheath over a Bentson wire. A closure arteriogram was performed through the side of the sheath confirming access within the right common femoral artery. Over a Bentson wire, a Mickelson catheter was advanced to the level of the thoracic aorta where back bled and flushed. The catheter was then utilized to select the superior mesenteric artery and selective superior mesenteric arteriogram was performed in various obliquities. The Mickelson catheter was then advanced to select the celiac artery and a selective celiac arteriogram was performed. With the use of a Fathom 14 microwire, a regular Renegade microcatheter advanced  into the common hepatic artery and a common hepatic arteriogram was performed. The microcatheter was advanced into the gastroduodenal artery and a selective gastroduodenal arteriogram was performed. At this point, the GDA was percutaneously coil embolized with multiple overlapping 5 mm, 4 mm and 3 mm interlock coils to near the vessel's origin. The micro catheter was retracted to the common hepatic artery and a post embolization common hepatic arteriogram was performed. The micro catheter was removed and a selective post embolization celiac arteriogram was performed. The Mickelson catheter was then utilized to again select the superior mesenteric artery and a selective superior mesenteric arteriogram was performed. Again, with the use of a Fathom 14 microwire, a regular Renegade microcatheter was utilized to select the remaining solitary pancreaticoduodenal branch arising from the proximal SMA. Selective injection was performed confirming appropriate positioning and the vessel was embolized with 3 overlapping 2 mm diameter soft interlock coils. The micro catheter was retracted proximally and post embolization arteriogram was performed. At this point, the procedure was terminated. All wires, catheters and sheaths were removed from the patient. Hemostasis was achieved at the right groin access site with deployment of an Exoseal closure device and manual compression. A dressing was placed. The patient tolerated the procedure well without immediate postprocedural complication FINDINGS: Celiac arteriogram demonstrates a conventional branching pattern with gastroduodenal artery supplying several pancreaticoduodenal branches. Note is made of a hypertrophied left gastric artery. As such, the GDA was percutaneously coil embolized from its distal aspect to near it's origin post embolization arteriogram  demonstrates technically successful result. Selective superior mesenteric arteriogram demonstrates a solitary remaining  pancreaticoduodenal branch arising from the proximal aspect of the SMA. The previously embolized cranial and caudal divisions of this vessel remain embolized. The remaining pancreaticoduodenal branch was percutaneously coil embolized with completion arteriogram demonstrating a technically excellent result. IMPRESSION: Technically successful percutaneous coil embolization of the GDA and a solitary remaining pancreaticoduodenal branch from the proximal SMA for refractory upper GI bleeding. Electronically Signed   By: Sandi Mariscal M.D.   On: 05/01/2016 08:18   Scheduled Meds: . antiseptic oral rinse  7 mL Mouth Rinse BID  . pantoprazole  40 mg Oral Daily  . sodium chloride flush  3 mL Intravenous Q12H   Continuous Infusions:  PRN Meds:.acetaminophen **OR** acetaminophen, alum & mag hydroxide-simeth, HYDROcodone-acetaminophen, magnesium hydroxide, MUSCLE RUB, ondansetron **OR** ondansetron (ZOFRAN) IV, phenol, polyethylene glycol   ASSESMENT:   * Recurrent SB GI bleed.  04/11/15 EGD.  Normal esophagus. Normal stomach. Normal duodenal bulb. Four non-bleeding angiodysplastic lesions in the duodenum. Treated with argon plasma coagulation (APC). Two non-bleeding angiodysplastic lesions in the duodenum. Treated with argon plasma coagulation  ANGIODYSPLASIA AS THE CAUSE FOR HER RECURRENT MELENA 04/18/16 entersocopy; non-bleeding ulcer within duodenum at site of previous APC ablation. Treated with APC and clips placed.  5/5/7 RBC scan bleeding in region duodenum at site of clips 04/22/16 embolization of GDA 04/27/16 Capsule endoscopy: Active bleeding, beyond reach of enteroscopy 04/29/16 RBC scan. Active SB bleeding.  04/30/16 coil embolization of the GDA and remaining pancreatico-duodenal branch of the SMA  * Blood loss anemia. S/p 9 to 10 PRBCs. Latest 2 units on 5/13 for Hgb 6.6.   hgb today is down 0.5 grams.  Tolerating anemia well.   *  Epigastric discomfort: ongoing.  Did not improve  with BID PPI or with Carafate.  Cardiac enzymes unremarkable 5/1 - 5/5.       PLAN   *  Observation, AM CBCs.  CBC in AM.    *  Decision re transfusion per attending: FPTS. .    *  Regular diet    Azucena Freed  05/01/2016, 9:02 AM Pager: [phone]   I have interviewed and examined the patient.Agree with exam above.   I am cautiously optimistic that the latest IR procedure has controlled this bleeding, but there is still the possibility she is still bleeding (or may bleed again ) from a more distal SB lesion suspected on capsule study.  Not clear to me if that was a separate lesion or blood from the proximal lesion having passed to the more distal SB.  At any rate, if by this time tomorrow she is still passing melena AND dropping Hct, then she needs a transfer to [facility] for double balloon enteroscopy.  Please give her 1 unit of PRBCs today.

## 2016-05-01 NOTE — Progress Notes (Signed)
Physical Therapy Treatment Patient Details Name: Nicole Perez MRN: [id number] DOB: [birth year] Today's Date: 05/01/2016    History of Present Illness Nicole Perez is a 82 y.o. female with past medical history as listed below who was recently admitted on 4/25 with chest pain, dyspnea, weakness and anemia as well as dark stools. Subsequent endoscopy revealed angiodysplastic lesions in the duodenum which were treated with APC. Patient was subsequently discharged on 4/27 and readmitted again on 5/1 with recurrent GI bleeding/melena. Endoscopy on 5/3 revealed nonbleeding duodenal ulcer which was treated with APC/clipping. Nuclear medicine scan on 5/5 revealed trace/faint amount of activity suggestive of a small, intermittent bleed in the proximal small bowel region. Request now received from GI for mesenteric/visceral arteriography with possible embolization.    PT Comments    Patient with a decreased activity tolerance this session with c/o fatigue during ambulation. Pt's SpO2 decreased to 87% after ambulation but quickly returned  To 91% with pursed lip breathing. Continue to progress as tolerated.   Follow Up Recommendations  Home health PT ([facility], HHOT)     Equipment Recommendations  3in1 (PT)    Recommendations for Other Services       Precautions / Restrictions Precautions Precautions: Fall Restrictions Weight Bearing Restrictions: No    Mobility  Bed Mobility                  Transfers Overall transfer level: Needs assistance Equipment used: Rolling walker (2 wheeled) Transfers: Sit to/from Stand Sit to Stand: Supervision         General transfer comment: received standing at sink with family memeber washing hands; sat EOB with cues for hand placement and safe use of AD  Ambulation/Gait Ambulation/Gait assistance: Supervision Ambulation Distance (Feet): 120 Feet Assistive device: Rolling walker (2 wheeled) Gait Pattern/deviations: Step-through  pattern;Decreased stride length;Trunk flexed Gait velocity: decreased   General Gait Details: cues for posture and bilat heel strike; pt c/o fatigue and one standing rest break needed   Stairs            Wheelchair Mobility    Modified Rankin (Stroke Patients Only)       Balance     Sitting balance-Leahy Scale: Good       Standing balance-Leahy Scale: Good                      Cognition Arousal/Alertness: Awake/alert Behavior During Therapy: WFL for tasks assessed/performed Overall Cognitive Status: Within Functional Limits for tasks assessed                      Exercises General Exercises - Lower Extremity Ankle Circles/Pumps: AROM;Both;20 reps;Seated Long Arc Quad: AROM;Both;20 reps;Seated Hip Flexion/Marching: AROM;Both;20 reps;Seated    General Comments General comments (skin integrity, edema, etc.): SpO2 decreased to 87% with OOB mobility and increased to 91% with cues for pursed lip breathing      Pertinent Vitals/Pain Pain Assessment: Faces Faces Pain Scale: Hurts little more Pain Location: abdomen Pain Descriptors / Indicators: Sore Pain Intervention(s): Limited activity within patient's tolerance;Monitored during session;Repositioned    Home Living                      Prior Function            PT Goals (current goals can now be found in the care plan section) Acute Rehab PT Goals Patient Stated Goal: to go home Time For Goal Achievement: 05/05/16 Progress towards PT  goals: Progressing toward goals    Frequency  Min 3X/week    PT Plan Current plan remains appropriate    Co-evaluation             End of Session Equipment Utilized During Treatment: Gait belt Activity Tolerance: Patient limited by fatigue Patient left: with call bell/phone within reach;in bed;with family/visitor present     Time: [id number] PT Time Calculation (min) (ACUTE ONLY): 17 min  Charges:  $Gait Training: 8-22 mins                     G Codes:      Salina April, PTA Pager: [phone]   05/01/2016, 1:53 PM

## 2016-05-02 DIAGNOSIS — K922 Gastrointestinal hemorrhage, unspecified: Secondary | ICD-10-CM | POA: Insufficient documentation

## 2016-05-02 LAB — HEMOGLOBIN AND HEMATOCRIT, BLOOD
HCT: 20.5 % — ABNORMAL LOW (ref 36.0–46.0)
HCT: 23 % — ABNORMAL LOW (ref 36.0–46.0)
HEMOGLOBIN: 6.6 g/dL — AB (ref 12.0–15.0)
Hemoglobin: 7.5 g/dL — ABNORMAL LOW (ref 12.0–15.0)

## 2016-05-02 LAB — LACTIC ACID, PLASMA: Lactic Acid, Venous: 1.7 mmol/L (ref 0.5–2.0)

## 2016-05-02 LAB — PREPARE RBC (CROSSMATCH)

## 2016-05-02 MED ORDER — SODIUM CHLORIDE 0.9 % IV SOLN: Freq: Once | INTRAVENOUS | Status: DC

## 2016-05-02 NOTE — Progress Notes (Signed)
Pt with an infiltrated iv during blood administration. Iv team was consulted as well as a coworker to attempt to place a new iv site. Iv team able to get a new iv site but suggests if this site infiltarates for pt to get a central line as her veins are limited at this point. Pt blood set back up and vitals retaken prior to restart of blood. Will continue to monitor pt and iv site.

## 2016-05-02 NOTE — Progress Notes (Signed)
We have been trying to find a location that offers Double Balloon Endoscopy. [facility] no longer offers this procedure, unfortunately. I have called [facility] and [facility] in [place]. [facility] does not offer this procedure. [facility] has a single balloon endoscope, however it is out for repair. I was told by [facility] that next transfer center would be [facility] for this procedure.   I was paged by floor RN to call the patient's daughter [name] and update her. I called her and she is very upset that [facility] is not an option for transfer for Ms Bobian. She said "Do you mean to tell me that all this time, all of these doctors have been saying that she would be transferred to [facility], and just now we find out that it is not an option". "There is no way with all the doctors in this state that you all do not know what is going on at [facility]". "Someone should have known that [facility] does not do this anymore!". I mentioned to her that we had not called [facility] prior to this because we were hopeful that we would be able to solve the problem here, and that Ms. Linquist would not need transfer to [facility]. We do not routinely call ahead to find out if transfer or procedures are available until we actually need to act on it. She was not satisfied with this answer, and she proceeded to repeat the above statements. She is very upset. She says "heads are going to roll for this". I asked her if there was anything I could offer that would make the situation better, and she said "no". She says she is coming here to the hospital, and I will try to talk with her at Ms. Behnken' room so that I can update her as well. Will update floor staff.    Paula Compton, MD Family Medicine - PGY 2

## 2016-05-02 NOTE — Progress Notes (Signed)
Follow up on my earlier note. [name] came to the floor to follow up on our earlier phone conversation. The rest of her family was also present. I was paged, and I went to meet with them. The charge nurse Angie also came with me to discuss the situation with the family. [name] started the conversation and again voiced her frustration that the family had been prepared for Nicole Perez to be transferred to [facility] for nearly one week by the Medicine team as well as the other specialists in the event that our therapeutic measures here were unsuccessful. They felt that it "did not make sense" that [name] would no longer provide the Double Balloon Endoscopy procedure. [name] felt that we should have "known ahead of time" that this procedure was unavailable at [name]. I explained that we did not have a reason to anticipate that the procedure would not be an option for Nicole Perez as it was performed at [facility] routinely prior to this point. I apologized with regard to the unfortunate revelation that the it was no longer available at [name]. I then proceeded to explain that I had called [name], and [name]. [facility] had a single balloon scope, however this scope is out for repair and unavailable, and [facility] did not have this procedure as an option. I had called [name] and was waiting on a return call at the time. I explained to the family that we have gone to great lengths to try to correct the bleeding, and that the inpatient team and specialty teams were going to great lengths to try to help her. However, we have reached the limits of our ability to be able to correct the problem here. Upon receiving the news that Perez was our last option here in the [place], the family became very upset and Nicole Perez did mention "Suing" at one point in passing. However, I assured them that we were doing everything we could to try to make sure that we get Nicole Perez the care  that she needs.   I received a return call from Dr. Egbert Garibaldi at [facility] who discussed the case of the patient with me. Upon receiving the information, he said that while they do not have the double balloon endoscope at [name], they do have an advanced endoscopist who may be able to help. From briefly discussing the patient's information with him, he felt that the bleed may be proximal enough that they may still be able to reach the area of bleeding.   I returned to the family, and I presented this option. I mentioned specifically to them that the team at [name] also does not have a double balloon endoscope, but that the Gastroenterologist I spoke with was hopeful that they would be able to help. The family accepted the offer to transfer to [name].   Awaiting return call from the [name] service for transfer.   Paula Compton, MD Family Medicine -PGY 2

## 2016-05-02 NOTE — Progress Notes (Signed)
Have been trying to transfer pt. This afternoon. Upon discussion with the [facility] personnel, they no longer provide Double Balloon Endoscopy at their facility. Attempting to transfer to [facility] at this time, however, I have yet to hear back. Will continue to try overnight. Pt. Remains stable.    Paula Compton, MD Family Medicine - PGY 2

## 2016-05-02 NOTE — Progress Notes (Signed)
I received a return call from [facility] - Dr. Angela Adam will be the accepting hospitalist for Mrs. Nicole Perez. They are currently on a bed hold, but given the situation, Dr. Angela Adam said that she would try to get approval to move Ms. Sensing further up on the waiting list. Awaiting return call regarding readiness for transfer.   Paula Compton, MD Family Medicine - PGY 2

## 2016-05-02 NOTE — Progress Notes (Signed)
UR COMPLETED  

## 2016-05-02 NOTE — Progress Notes (Signed)
Daily Rounding Note  05/02/2016, 8:25 AM  LOS: 15 days   SUBJECTIVE:       Pt had several large, black, loose BMs yeasterday afternoon and evening after a dose of Miralax.  Her epigastric pain improved after the BMs.  Hgb has dropped and PRBC x 1 ordered.  She feels well.  Per pt, plan is for transfusion of PRBC and transfer to [place]:         Vital signs in last 24 hours:    Temp:  [98.4 F (36.9 C)-99.2 F (37.3 C)] 98.6 F (37 C) (05/17 0808) Pulse Rate:  [50-66] 50 (05/17 0808) Resp:  [18] 18 (05/17 0808) BP: (94-115)/(33-52) 95/36 mmHg (05/17 0808) SpO2:  [97 %-100 %] 99 % (05/17 0808) Weight:  [68.7 kg (151 lb 7.3 oz)] 68.7 kg (151 lb 7.3 oz) (05/17 0529) Last BM Date: 04/30/16 Filed Weights   04/30/16 0227 05/01/16 0456 05/02/16 0529  Weight: 71.4 kg (157 lb 6.5 oz) 68.856 kg (151 lb 12.8 oz) 68.7 kg (151 lb 7.3 oz)   General: Pleasant, comfortable.  In good spirits.     Heart: RRR.   Chest: clear bil.   Abdomen: soft, NT, ND.  Active BS.    Extremities: no CCE Neuro/Psych:  Oriented x 3.  No weakness.  Fully alert.    Intake/Output from previous day: 05/16 0701 - 05/17 0700 In: 200 [P.O.:200] Out: 400 [Urine:400]  Intake/Output this shift:    Lab Results:  Recent Labs  04/30/16 0614 05/01/16 0825 05/01/16 1806 05/02/16 0430  WBC 5.4 7.4  --   --   HGB 7.6* 7.0* 7.0* 6.6*  HCT 23.2* 21.6* 21.6* 20.5*  PLT 199 236  --   --    BMET  Recent Labs  04/30/16 0614 05/01/16 0825  NA 140 140  K 4.1 4.3  CL 108 106  CO2 26 24  GLUCOSE 83 96  BUN 28* 25*  CREATININE 0.94 0.89  CALCIUM 7.9* 8.2*   LFT No results for input(s): PROT, ALBUMIN, AST, ALT, ALKPHOS, BILITOT, BILIDIR, IBILI in the last 72 hours. PT/INR  Recent Labs  04/30/16 0947 05/01/16 1806  LABPROT 14.8 15.0  INR 1.14 1.16   Hepatitis Panel No results for input(s): HEPBSAG, HCVAB, HEPAIGM, HEPBIGM in the  last 72 hours.  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 Unfortunately, the patient has experienced persistent upper GI bleed with capsule endoscopy suggesting persistent bleeding within the duodenum. As patient is a poor operative candidate, request made for repeat catheter directed angiography and potential percutaneous coil embolization. EXAM: 1. ULTRASOUND GUIDANCE FOR ARTERIAL ACCESS 2. SUPERIOR MESENTERIC ARTERIOGRAM 3. CELIAC ARTERIOGRAM (3rd ORDER) 4. SELECTIVE COMMON HEPATIC ARTERIOGRAM 5. SELECTIVE 3rd ORDER ANGIOGRAM AND PERCUTANEOUS COIL EMBOLIZATION OF REMAINING PANCREATICODUODENAL SUPPLY FROM THE SUPERIOR MESENTERIC ARTERY. 6. SELECTIVE 3rd ORDER GASTRODUODENAL ARTERIOGRAM AND PERCUTANEOUS COIL EMBOLIZATION. 7. FOLLOW-UP ARTERIOGRAPHY AFTER EMBOLIZATION MEDICATIONS: None ANESTHESIA/SEDATION: Moderate (conscious) sedation was employed during this procedure. A total of Versed 2 mg and Fentanyl 150 mcg was administered intravenously. Moderate Sedation Time: 156 minutes. The patient's level of consciousness and vital signs were monitored  continuously by radiology nursing throughout the procedure under my direct supervision. CONTRAST:  100 cc Isovue-300 FLUOROSCOPY TIME:  Fluoroscopy Time: 22 minutes 6 seconds (2121 mGy). COMPLICATIONS: None immediate. PROCEDURE: Informed consent was obtained from the patient following explanation of the procedure, risks, benefits and alternatives. The patient understands, agrees and consents for the procedure. All questions were addressed. A time out was performed  prior to the initiation of the procedure. Maximal barrier sterile technique utilized including caps, mask, sterile gowns, sterile gloves, large sterile drape, hand hygiene, and Betadine prep. The right femoral head was marked fluoroscopically. Under ultrasound guidance, the right common femoral artery was accessed with a micropuncture kit after the overlying soft tissues were anesthetized with 1% lidocaine. An ultrasound image was saved for documentation purposes. The micropuncture sheath was exchanged for a 5 [place] vascular sheath over a Bentson wire. A closure arteriogram was performed through the side of the sheath confirming access within the right common femoral artery. Over a Bentson wire, a Mickelson catheter was advanced to the level of the thoracic aorta where back bled and flushed. The catheter was then utilized to select the superior mesenteric artery and selective superior mesenteric arteriogram was performed in various obliquities. The Mickelson catheter was then advanced to select the celiac artery and a selective celiac arteriogram was performed. With the use of a Fathom 14 microwire, a regular Renegade microcatheter advanced into the common hepatic artery and a common hepatic arteriogram was performed. The microcatheter was advanced into the gastroduodenal artery and a selective gastroduodenal arteriogram was performed. At this point, the GDA was percutaneously coil embolized with multiple overlapping 5 mm, 4 mm and 3 mm interlock coils to near the vessel's origin. The micro catheter was retracted to the common hepatic artery and a post embolization common hepatic arteriogram was performed. The micro catheter was removed and a selective post embolization celiac arteriogram was performed. The Mickelson catheter was then utilized to again select the superior mesenteric artery and a selective superior mesenteric arteriogram was performed. Again, with the use of a Fathom 14 microwire, a regular Renegade  microcatheter was utilized to select the remaining solitary pancreaticoduodenal branch arising from the proximal SMA. Selective injection was performed confirming appropriate positioning and the vessel was embolized with 3 overlapping 2 mm diameter soft interlock coils. The micro catheter was retracted proximally and post embolization arteriogram was performed. At this point, the procedure was terminated. All wires, catheters and sheaths were removed from the patient. Hemostasis was achieved at the right groin access site with deployment of an Exoseal closure device and manual compression. A dressing was placed. The patient tolerated the procedure well without immediate postprocedural complication FINDINGS: Celiac arteriogram demonstrates a conventional branching pattern with gastroduodenal artery supplying several pancreaticoduodenal branches. Note is made of a hypertrophied left gastric artery. As such, the GDA was percutaneously coil embolized from its distal aspect to near it's origin post embolization arteriogram demonstrates technically successful result. Selective superior mesenteric arteriogram demonstrates a solitary remaining pancreaticoduodenal branch arising from the proximal aspect of the SMA. The previously embolized cranial and caudal divisions of this vessel remain embolized. The remaining pancreaticoduodenal branch was percutaneously coil embolized with completion arteriogram demonstrating a technically excellent result. IMPRESSION: Technically successful percutaneous coil embolization of the GDA and a solitary remaining pancreaticoduodenal branch from the proximal SMA for refractory upper GI bleeding. Electronically Signed   By: Sandi Mariscal M.D.   On: 05/01/2016 08:18   Scheduled Meds: . sodium chloride   Intravenous Once  .  antiseptic oral rinse  7 mL Mouth Rinse BID  . pantoprazole  40 mg Oral Daily  . sodium chloride flush  3 mL Intravenous Q12H   Continuous Infusions:  PRN  Meds:.acetaminophen **OR** acetaminophen, alum & mag hydroxide-simeth, HYDROcodone-acetaminophen, magnesium hydroxide, MUSCLE RUB, ondansetron **OR** ondansetron (ZOFRAN) IV, phenol, polyethylene glycol   ASSESMENT:   * SB GI bleed.  04/11/15 EGD.  Normal esophagus. Normal stomach. Normal duodenal bulb. Four non-bleeding angiodysplastic lesions in the duodenum. Treated with argon plasma coagulation (APC). Two non-bleeding angiodysplastic lesions in the duodenum. Treated with argon plasma coagulation  ANGIODYSPLASIA AS THE CAUSE FOR HER RECURRENT MELENA 04/18/16 entersocopy; non-bleeding ulcer within duodenum at site of previous APC ablation. Treated with APC and clips placed.  5/5/7 RBC scan bleeding in region duodenum at site of clips 04/22/16 embolization of GDA 04/27/16 Capsule endoscopy: Active bleeding, beyond reach of enteroscopy 04/29/16 RBC scan. Active SB bleeding.  04/30/16 coil embolization of the GDA and remaining pancreatico-duodenal branch of the SMA  * Blood loss anemia. Hgb not yet stabilized and continues decline. S/p 9 to 10 PRBCs. 1 additional PRBC ordered today.   * Epigastric discomfort: ongoing. Did not improve with BID PPI or with Carafate. Cardiac enzymes unremarkable 5/1 - 5/5.? If this is due to some post embolization ischemia?   PLAN   *  Transfuse PRBC x 1 as ordered.  Apparently she will transfer to [facility] today, but this yet to be confirmed in resident's note.     Nicole Perez  05/02/2016, 8:25 AM Pager: [phone]

## 2016-05-02 NOTE — Progress Notes (Signed)
CRITICAL VALUE ALERT  Critical value received:  HGB 6.6  Date of notification:  05/02/2016  Time of notification:  0626  Critical value read back:Yes.    Nurse who received alert:  Clydell Hakim  MD notified (1st page):  Lajuana Ripple  Time of first page:  0627  MD notified (2nd page):  Time of second page:  Responding MD:  Lajuana Ripple  Time MD responded:  [phone]

## 2016-05-02 NOTE — Progress Notes (Signed)
Family Medicine Teaching Service Daily Progress Note Intern Pager: [phone]  Patient name: Nicole Perez Medical record number: [id number] Date of birth: 03-03-1933 Age: 82 y.o. 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 5/6 Hgb dropped to 7 and transfused 1 unit pRBCs 5/7 Embolization of pancreaticoduodenal arteries by IR 5/7 Hgb 7.2, and transfused 1u pRBC 5/10 Hgb dropped to 6.6, and transfused 2u pRBC  5/13 Capsule endoscopy: bleeding from small intestine, to far to reach with endoscopy 5/13 Hgb dropped to 6.6, and transfused 1u pRBC 5/14 Tagged RBC with faint small bowel bleeding (not well localized) 5/15 Visceral Angiogram; branch of SMA embolized per IR 5/16 Hgb stable a 7 5/17 Hgb dropped to 6.6, 1 U pRBCs given  Assessment and Plan: Nicole Perez is a 82 y.o. female presenting with symptomatic anemia. PMH is significant for recent hospitalization for anemia/ GI bleed/ Angiodysplasia, hypertension, neurogenic bladder and OA  Symptomatic anemia 2/2 GI bleed: Continued to drop Hgb. Hgb 5.5 on admission>>8u>7.9>8.3>7.6>7.0>6.6. Still having bloody BMs and hgb is dropping. Had 1 dark stool and 1 was blood streaked.  - GI on board and appreciate recs - Surgery following. Appreciate recs - IR following.   -Angiography on 5/15, branch of SMA embolized  - Protonix 40 mg daily  - VS per floor protocol - Hgb 6.6 this AM and still having  melanotic/bloody BMs. Will give 1 U of pRBCs and work on transfer to [facility] today.   Epigastric Abdominal Pain: improved. Mild TTP over epigastric areas. - Continue PPI - Also on MOM as needed  Chest pain: resolved. ACS ruled out with serial trop and EKG  - Appreciate card recs, Nuclear scan as outpt - will avoid ASA in the setting of current GI bleed  HTN: Holding for now due to blood pressures. lisinopril and HCTZ at home - Holding home meds - Monitor BP  Bradycardia: HR has been in 50-60's at times. Had history of junctional rhythm. On 30 day monitor before admission. - Continue to monitor HR  Neurogenic bladder/UTI: self caths at home. S/p CTX 5/2>5/4 and Keflex 5/4>5/8 - I &O cath as needed   Osteoarthritis: Foot and leg pain. History of back & LE OA. On Lorcet at home - [place] 10/325 mg twice a day  - Avoid NSAIDs in the setting of GI bleed - Bowel regimen (miralax and MOM)  FEN/GI:  -clear liquid  Prophylaxis: SCDs in the setting of GI bleeding  Disposition: [facility] transfer  Subjective:  Patient states that she feels a lot better since have large BMs overnight after some Miralax. She cannot tell if her stools were bloody but she states the nurse said they were dark.  Admits to feeling tired but no dizziness or lightheadedness. She is still agreeable to going to [facility] if needed.   Objective: Temp:  [98.4 F (36.9 C)-99.2 F (37.3 C)] 99.2 F (37.3 C) (05/17 0529) Pulse Rate:  [57-66] 58 (05/17 0529) Resp:  [  18] 18 (05/17 0529) BP: (94-115)/(33-52) 97/33 mmHg (05/17 0529) SpO2:  [97 %-100 %] 97 % (05/17 0529) Weight:  [151 lb 7.3 oz (68.7 kg)] 151 lb 7.3 oz (68.7 kg) (05/17 0529) Physical Exam: GEN: laying in bed, in NAD. Appears pale CVS: RRR, normal s1 and s2, no murmurs, no edema RESP: normal work of breathing, no crackles or wheeze GI: normal bowel sound, soft, mild tenderness to palpation in epigastric region, non-distended NEURO:  alert and oriented, no gross defecits  Laboratory:  Recent Labs Lab 04/28/16 0728  04/30/16 0614 05/01/16 0825 05/01/16 1806 05/02/16 0430  WBC 5.5  --  5.4 7.4  --   --   HGB 6.6*  < > 7.6* 7.0* 7.0* 6.6*  HCT 20.8*  < > 23.2* 21.6* 21.6* 20.5*  PLT 193  --  199 236  --   --   < > = values in this interval not displayed.  Recent Labs Lab 04/28/16 0725 04/30/16 0614 05/01/16 0825  NA 141 140 140  K 4.8 4.1 4.3  CL 107 108 106  CO2 26 26 24   BUN 25* 28* 25*  CREATININE 0.99 0.94 0.89  CALCIUM 8.0* 7.9* 8.2*  GLUCOSE 96 83 96    Imaging/Diagnostic Tests: No results found.    Carlyle Dolly, MD 05/02/2016, 7:11 AM PGY-1, [place] Intern pager: [phone], text pages welcome

## 2016-05-03 DIAGNOSIS — K6381 Dieulafoy lesion of intestine: Secondary | ICD-10-CM | POA: Diagnosis not present

## 2016-05-03 DIAGNOSIS — K264 Chronic or unspecified duodenal ulcer with hemorrhage: Secondary | ICD-10-CM | POA: Diagnosis not present

## 2016-05-03 DIAGNOSIS — R509 Fever, unspecified: Secondary | ICD-10-CM | POA: Diagnosis not present

## 2016-05-03 DIAGNOSIS — Q2730 Arteriovenous malformation, site unspecified: Secondary | ICD-10-CM

## 2016-05-03 DIAGNOSIS — Z66 Do not resuscitate: Secondary | ICD-10-CM | POA: Diagnosis not present

## 2016-05-03 DIAGNOSIS — M19079 Primary osteoarthritis, unspecified ankle and foot: Secondary | ICD-10-CM | POA: Diagnosis not present

## 2016-05-03 DIAGNOSIS — I10 Essential (primary) hypertension: Secondary | ICD-10-CM | POA: Diagnosis not present

## 2016-05-03 DIAGNOSIS — N319 Neuromuscular dysfunction of bladder, unspecified: Secondary | ICD-10-CM | POA: Diagnosis not present

## 2016-05-03 DIAGNOSIS — Q2733 Arteriovenous malformation of digestive system vessel: Secondary | ICD-10-CM | POA: Diagnosis not present

## 2016-05-03 DIAGNOSIS — K31811 Angiodysplasia of stomach and duodenum with bleeding: Secondary | ICD-10-CM | POA: Diagnosis not present

## 2016-05-03 DIAGNOSIS — I2489 Other forms of acute ischemic heart disease: Secondary | ICD-10-CM

## 2016-05-03 DIAGNOSIS — R011 Cardiac murmur, unspecified: Secondary | ICD-10-CM | POA: Diagnosis not present

## 2016-05-03 DIAGNOSIS — H109 Unspecified conjunctivitis: Secondary | ICD-10-CM | POA: Diagnosis not present

## 2016-05-03 DIAGNOSIS — E785 Hyperlipidemia, unspecified: Secondary | ICD-10-CM | POA: Diagnosis not present

## 2016-05-03 DIAGNOSIS — H903 Sensorineural hearing loss, bilateral: Secondary | ICD-10-CM | POA: Diagnosis not present

## 2016-05-03 DIAGNOSIS — I248 Other forms of acute ischemic heart disease: Secondary | ICD-10-CM

## 2016-05-03 DIAGNOSIS — I498 Other specified cardiac arrhythmias: Secondary | ICD-10-CM

## 2016-05-03 DIAGNOSIS — K922 Gastrointestinal hemorrhage, unspecified: Secondary | ICD-10-CM | POA: Insufficient documentation

## 2016-05-03 DIAGNOSIS — B9620 Unspecified Escherichia coli [E. coli] as the cause of diseases classified elsewhere: Secondary | ICD-10-CM | POA: Diagnosis not present

## 2016-05-03 DIAGNOSIS — M1990 Unspecified osteoarthritis, unspecified site: Secondary | ICD-10-CM | POA: Diagnosis not present

## 2016-05-03 DIAGNOSIS — D62 Acute posthemorrhagic anemia: Secondary | ICD-10-CM | POA: Diagnosis not present

## 2016-05-03 DIAGNOSIS — I499 Cardiac arrhythmia, unspecified: Secondary | ICD-10-CM | POA: Diagnosis not present

## 2016-05-03 DIAGNOSIS — N390 Urinary tract infection, site not specified: Secondary | ICD-10-CM | POA: Diagnosis not present

## 2016-05-03 DIAGNOSIS — K5900 Constipation, unspecified: Secondary | ICD-10-CM | POA: Diagnosis not present

## 2016-05-03 DIAGNOSIS — Z87891 Personal history of nicotine dependence: Secondary | ICD-10-CM | POA: Diagnosis not present

## 2016-05-03 DIAGNOSIS — N179 Acute kidney failure, unspecified: Secondary | ICD-10-CM | POA: Diagnosis not present

## 2016-05-03 HISTORY — DX: Arteriovenous malformation, site unspecified: Q27.30

## 2016-05-03 HISTORY — DX: Other forms of acute ischemic heart disease: I24.8

## 2016-05-03 HISTORY — DX: Gastrointestinal hemorrhage, unspecified: K92.2

## 2016-05-03 HISTORY — DX: Other specified cardiac arrhythmias: I49.8

## 2016-05-03 HISTORY — DX: Other forms of acute ischemic heart disease: I24.89

## 2016-05-03 LAB — TYPE AND SCREEN
ABO/RH(D): O POS
ANTIBODY SCREEN: NEGATIVE
UNIT DIVISION: 0

## 2016-05-03 NOTE — Progress Notes (Signed)
Pt has been pick up by care link to [facility], all the necessary documents and imagine CD is given out to care link report has been called to the nurse receiving pt at [facility]

## 2016-05-04 DIAGNOSIS — K6381 Dieulafoy lesion of intestine: Secondary | ICD-10-CM | POA: Insufficient documentation

## 2016-05-04 HISTORY — DX: Dieulafoy lesion of intestine: K63.81

## 2016-05-04 HISTORY — PX: SMALL BOWEL ENTEROSCOPY: SHX2415

## 2016-05-07 DIAGNOSIS — D649 Anemia, unspecified: Secondary | ICD-10-CM | POA: Diagnosis not present

## 2016-05-07 DIAGNOSIS — R531 Weakness: Secondary | ICD-10-CM | POA: Diagnosis not present

## 2016-05-09 ENCOUNTER — Other Ambulatory Visit: Payer: Self-pay | Admitting: Family Medicine

## 2016-05-09 ENCOUNTER — Encounter (HOSPITAL_COMMUNITY): Payer: Self-pay | Admitting: Family Medicine

## 2016-05-09 ENCOUNTER — Ambulatory Visit (INDEPENDENT_AMBULATORY_CARE_PROVIDER_SITE_OTHER): Payer: Medicare Other | Admitting: Family Medicine

## 2016-05-09 ENCOUNTER — Emergency Department (HOSPITAL_BASED_OUTPATIENT_CLINIC_OR_DEPARTMENT_OTHER)
Admit: 2016-05-09 | Discharge: 2016-05-09 | Disposition: A | Discharge status: Home / Self Care | Payer: Medicare Other | Attending: Emergency Medicine | Admitting: Emergency Medicine

## 2016-05-09 ENCOUNTER — Telehealth: Payer: Self-pay | Admitting: *Deleted

## 2016-05-09 ENCOUNTER — Encounter: Payer: Self-pay | Admitting: Family Medicine

## 2016-05-09 ENCOUNTER — Emergency Department (HOSPITAL_COMMUNITY)
Admission: EM | Admit: 2016-05-09 | Discharge: 2016-05-09 | Disposition: A | Discharge status: Other Acute Inpatient Hospital | Payer: Medicare Other | Attending: Emergency Medicine | Admitting: Emergency Medicine

## 2016-05-09 ENCOUNTER — Telehealth: Payer: Self-pay | Admitting: Family Medicine

## 2016-05-09 VITALS — BP 127/51 | HR 66 | Temp 98.5°F | Ht 59.0 in | Wt 143.4 lb

## 2016-05-09 DIAGNOSIS — D62 Acute posthemorrhagic anemia: Secondary | ICD-10-CM | POA: Diagnosis not present

## 2016-05-09 DIAGNOSIS — K922 Gastrointestinal hemorrhage, unspecified: Secondary | ICD-10-CM

## 2016-05-09 DIAGNOSIS — D509 Iron deficiency anemia, unspecified: Secondary | ICD-10-CM | POA: Diagnosis not present

## 2016-05-09 DIAGNOSIS — N179 Acute kidney failure, unspecified: Secondary | ICD-10-CM

## 2016-05-09 DIAGNOSIS — D500 Iron deficiency anemia secondary to blood loss (chronic): Secondary | ICD-10-CM

## 2016-05-09 DIAGNOSIS — N390 Urinary tract infection, site not specified: Secondary | ICD-10-CM | POA: Diagnosis not present

## 2016-05-09 DIAGNOSIS — Z8619 Personal history of other infectious and parasitic diseases: Secondary | ICD-10-CM | POA: Diagnosis not present

## 2016-05-09 DIAGNOSIS — H905 Unspecified sensorineural hearing loss: Secondary | ICD-10-CM | POA: Insufficient documentation

## 2016-05-09 DIAGNOSIS — Z8639 Personal history of other endocrine, nutritional and metabolic disease: Secondary | ICD-10-CM | POA: Insufficient documentation

## 2016-05-09 DIAGNOSIS — M7989 Other specified soft tissue disorders: Secondary | ICD-10-CM

## 2016-05-09 DIAGNOSIS — M25571 Pain in right ankle and joints of right foot: Secondary | ICD-10-CM | POA: Diagnosis not present

## 2016-05-09 DIAGNOSIS — Q2730 Arteriovenous malformation, site unspecified: Secondary | ICD-10-CM | POA: Diagnosis not present

## 2016-05-09 DIAGNOSIS — R600 Localized edema: Secondary | ICD-10-CM | POA: Insufficient documentation

## 2016-05-09 DIAGNOSIS — M1612 Unilateral primary osteoarthritis, left hip: Secondary | ICD-10-CM | POA: Insufficient documentation

## 2016-05-09 DIAGNOSIS — G629 Polyneuropathy, unspecified: Secondary | ICD-10-CM | POA: Diagnosis not present

## 2016-05-09 DIAGNOSIS — B9620 Unspecified Escherichia coli [E. coli] as the cause of diseases classified elsewhere: Secondary | ICD-10-CM | POA: Diagnosis not present

## 2016-05-09 DIAGNOSIS — Z86010 Personal history of colonic polyps: Secondary | ICD-10-CM | POA: Diagnosis not present

## 2016-05-09 DIAGNOSIS — Z87440 Personal history of urinary (tract) infections: Secondary | ICD-10-CM | POA: Diagnosis not present

## 2016-05-09 DIAGNOSIS — Z90710 Acquired absence of both cervix and uterus: Secondary | ICD-10-CM | POA: Diagnosis not present

## 2016-05-09 DIAGNOSIS — Z87448 Personal history of other diseases of urinary system: Secondary | ICD-10-CM | POA: Diagnosis not present

## 2016-05-09 DIAGNOSIS — Z87891 Personal history of nicotine dependence: Secondary | ICD-10-CM | POA: Insufficient documentation

## 2016-05-09 DIAGNOSIS — Z79899 Other long term (current) drug therapy: Secondary | ICD-10-CM | POA: Diagnosis not present

## 2016-05-09 DIAGNOSIS — Z8701 Personal history of pneumonia (recurrent): Secondary | ICD-10-CM | POA: Insufficient documentation

## 2016-05-09 DIAGNOSIS — Z66 Do not resuscitate: Secondary | ICD-10-CM | POA: Diagnosis not present

## 2016-05-09 DIAGNOSIS — M1990 Unspecified osteoarthritis, unspecified site: Secondary | ICD-10-CM | POA: Diagnosis not present

## 2016-05-09 DIAGNOSIS — I200 Unstable angina: Secondary | ICD-10-CM | POA: Diagnosis not present

## 2016-05-09 DIAGNOSIS — K6381 Dieulafoy lesion of intestine: Secondary | ICD-10-CM | POA: Diagnosis not present

## 2016-05-09 DIAGNOSIS — K929 Disease of digestive system, unspecified: Secondary | ICD-10-CM | POA: Diagnosis not present

## 2016-05-09 DIAGNOSIS — Z86018 Personal history of other benign neoplasm: Secondary | ICD-10-CM | POA: Insufficient documentation

## 2016-05-09 DIAGNOSIS — K625 Hemorrhage of anus and rectum: Secondary | ICD-10-CM | POA: Diagnosis present

## 2016-05-09 DIAGNOSIS — N319 Neuromuscular dysfunction of bladder, unspecified: Secondary | ICD-10-CM | POA: Diagnosis not present

## 2016-05-09 DIAGNOSIS — K921 Melena: Secondary | ICD-10-CM | POA: Diagnosis not present

## 2016-05-09 DIAGNOSIS — I10 Essential (primary) hypertension: Secondary | ICD-10-CM | POA: Insufficient documentation

## 2016-05-09 DIAGNOSIS — I499 Cardiac arrhythmia, unspecified: Secondary | ICD-10-CM | POA: Diagnosis not present

## 2016-05-09 DIAGNOSIS — G8929 Other chronic pain: Secondary | ICD-10-CM | POA: Diagnosis not present

## 2016-05-09 DIAGNOSIS — K31819 Angiodysplasia of stomach and duodenum without bleeding: Secondary | ICD-10-CM | POA: Diagnosis not present

## 2016-05-09 DIAGNOSIS — T183XXS Foreign body in small intestine, sequela: Secondary | ICD-10-CM | POA: Diagnosis not present

## 2016-05-09 DIAGNOSIS — K5900 Constipation, unspecified: Secondary | ICD-10-CM | POA: Diagnosis not present

## 2016-05-09 DIAGNOSIS — Z8739 Personal history of other diseases of the musculoskeletal system and connective tissue: Secondary | ICD-10-CM | POA: Diagnosis not present

## 2016-05-09 DIAGNOSIS — K5521 Angiodysplasia of colon with hemorrhage: Secondary | ICD-10-CM | POA: Diagnosis not present

## 2016-05-09 DIAGNOSIS — R011 Cardiac murmur, unspecified: Secondary | ICD-10-CM | POA: Diagnosis not present

## 2016-05-09 HISTORY — DX: Localized edema: R60.0

## 2016-05-09 LAB — BASIC METABOLIC PANEL
ANION GAP: 6 (ref 5–15)
BUN: 20 mg/dL (ref 6–20)
CHLORIDE: 107 mmol/L (ref 101–111)
CO2: 25 mmol/L (ref 22–32)
Calcium: 8.4 mg/dL — ABNORMAL LOW (ref 8.9–10.3)
Creatinine, Ser: 0.91 mg/dL (ref 0.44–1.00)
GFR, EST NON AFRICAN AMERICAN: 58 mL/min — AB (ref >60)
Glucose, Bld: 101 mg/dL — ABNORMAL HIGH (ref 65–99)
POTASSIUM: 4.2 mmol/L (ref 3.5–5.1)
SODIUM: 138 mmol/L (ref 135–145)

## 2016-05-09 LAB — COMPREHENSIVE METABOLIC PANEL
ALBUMIN: 2.5 g/dL — AB (ref 3.5–5.0)
ALT: 13 U/L — AB (ref 14–54)
AST: 15 U/L (ref 15–41)
Alkaline Phosphatase: 47 U/L (ref 38–126)
Anion gap: 6 (ref 5–15)
BILIRUBIN TOTAL: 0.4 mg/dL (ref 0.3–1.2)
BUN: 20 mg/dL (ref 6–20)
CHLORIDE: 107 mmol/L (ref 101–111)
CO2: 25 mmol/L (ref 22–32)
CREATININE: 0.87 mg/dL (ref 0.44–1.00)
Calcium: 8.4 mg/dL — ABNORMAL LOW (ref 8.9–10.3)
GFR calc Af Amer: >60 mL/min (ref >60)
GLUCOSE: 103 mg/dL — AB (ref 65–99)
Potassium: 4 mmol/L (ref 3.5–5.1)
Sodium: 138 mmol/L (ref 135–145)
Total Protein: 5.5 g/dL — ABNORMAL LOW (ref 6.5–8.1)

## 2016-05-09 LAB — CBC
HEMATOCRIT: 21.9 % — AB (ref 36.0–46.0)
Hemoglobin: 6.9 g/dL — CL (ref 12.0–15.0)
MCH: 28.4 pg (ref 26.0–34.0)
MCHC: 31.5 g/dL (ref 30.0–36.0)
MCV: 90.1 fL (ref 78.0–100.0)
PLATELETS: 316 10*3/uL (ref 150–400)
RBC: 2.43 MIL/uL — ABNORMAL LOW (ref 3.87–5.11)
RDW: 15 % (ref 11.5–15.5)
WBC: 6.8 10*3/uL (ref 4.0–10.5)

## 2016-05-09 LAB — PREPARE RBC (CROSSMATCH)

## 2016-05-09 LAB — D-DIMER, QUANTITATIVE: D-Dimer, Quant: 1.76 ug/mL-FEU — ABNORMAL HIGH (ref 0.00–0.50)

## 2016-05-09 LAB — POC OCCULT BLOOD, ED: FECAL OCCULT BLD: POSITIVE — AB

## 2016-05-09 MED ORDER — SODIUM CHLORIDE 0.9 % IV SOLN: 10.0000 mL/h | Freq: Once | INTRAVENOUS | Status: DC

## 2016-05-09 MED ORDER — HYDROCODONE-ACETAMINOPHEN 5-325 MG PO TABS
2.0000 | ORAL_TABLET | Freq: Once | ORAL | Status: AC
  Administered 2016-05-09: 2 via ORAL
  Filled 2016-05-09: qty 2

## 2016-05-09 NOTE — ED Notes (Signed)
Dr Jeneen Rinks aware of Hgb 6.9

## 2016-05-09 NOTE — Assessment & Plan Note (Signed)
Seems improved.  Hopefully Discharge summary will give more details.  Did suggest she resume PPI at least for acute period

## 2016-05-09 NOTE — Telephone Encounter (Signed)
Spoke with Dr Rex Kras in [place] who is taking care of Ms Battaglino.  Just wanted to make sure was aware of high ddimer drawn this am

## 2016-05-09 NOTE — Telephone Encounter (Signed)
Received a call from [facility] that patient's Hgb is 6.8.  Precept with Dr. Nori Riis call patient have her go to ED for possible admission.   Patient was called and informed that her Hgb is very low and that she should go to emergency room for further evaluation.  Patient stated understanding.   Derl Barrow, RN

## 2016-05-09 NOTE — Telephone Encounter (Signed)
Sree, Physical Therapist with [facility] called to request verbal orders for physical therapy effective 05/07/16 2 times a week for 3 weeks and 1 time a week for 3 weeks. He is also requesting verbal orders for nursing services.  Please give him a call at [phone].  Derl Barrow, RN

## 2016-05-09 NOTE — Telephone Encounter (Signed)
Will forward to MD to advise. Braycen Burandt,CMA  

## 2016-05-09 NOTE — ED Notes (Signed)
IV team unable to get IV access. 

## 2016-05-09 NOTE — ED Notes (Signed)
RN at bedside for blood admin.

## 2016-05-09 NOTE — ED Provider Notes (Signed)
I was notified by nurse at triage that the patient's hemoglobin was 6.9. Apparently here because of concern of a low hemoglobin and recent rectal bleeding. 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 I recommended that she be given the next available bed. Her triage vital signs show BP 125/72 with pulse of 45.  Tanna Furry, MD 05/09/16 1438

## 2016-05-09 NOTE — ED Provider Notes (Signed)
CSN: [id number]     Arrival date & time 05/09/16  1250 History   First MD Initiated Contact with Patient 05/09/16 [phone]     Chief Complaint  Patient presents with  . Rectal Bleeding     (Consider location/radiation/quality/duration/timing/severity/associated sxs/prior Treatment) HPI Comments: 82 year old female with extensive past medical history including recurrent GI bleed who presents with low hemoglobin. The patient has been admitted multiple times recently for GI bleed and was recently transferred to [facility] for GI intervention. She was found to have a dieulafoy lesion that was clipped. Repeat hemoglobin on day of discharge was 9. Since being at home, she has had one bowel movement that was dark but she does report using iron. She had a routine follow-up appointment with her PCP today, where she had lab work drawn. She was called back because of low hemoglobin. She has had ongoing abdominal pain and bloating since her hospitalization at [facility]; she states that the symptoms have not changed recently. She denies any fevers, pain, or shortness of breath. She does state that she woke up in the middle the night not feeling well. She has not noticed any lightheadedness.   Patient is a 82 y.o. female presenting with hematochezia. The history is provided by the patient.  Rectal Bleeding   Past Medical History  Diagnosis Date  . Essential hypertension, benign 04/18/2010  . Lichen sclerosus et atrophicus of the vulva   . BACK PAIN, CHRONIC 04/18/2010    degenerative spine disease, spinal stenosis throughout spine  . DEGENERATIVE JOINT DISEASE, HIPS 09/11/2007    Multilevel degenerative spine dz and spinal stenosis  . INSOMNIA, CHRONIC 04/18/2010  . Parotid adenoma 1990, 2012    Right parotid, recurrent parotid pleimorphic adenoma.   . Osteoarthritis, multiple sites 08/11/2013  . Cystocele 08/11/2013  . Acute renal failure ([place]) 02/23/2014  . History of pneumonia 07/26/2011  . Rectal fissure 12/16/2013  .  UTI (urinary tract infection) 02/22/2014  . 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 Hearing loss sensory, bilateral 07/26/2011    Right >> Left.  Left ear hearing aid b/c work discrimination in       R. ear is very poor. Audiologist-Stephanie Nance at [facility] in [place].  (05/10/2010)   . HYPERLIPIDEMIA 05/10/2010    Qualifier: Diagnosis of  By: McDiarmid MD, Sherren Mocha    . Incontinence overflow, urine 04/28/2014  . Incomplete emptying of bladder 02/09/2014  . Paresthesia of both feet 08/06/2014  . Orthostatic hypotension 08/06/2014  . Blepharitis 11/16/2014    Diagnosis by optometrist, Renaldo Harrison on exam 11/13/2014  . Dry eye syndrome 11/16/2014  . Glaucoma suspect 11/16/2014  . Blood in stool 12/20/2014  . Cholelithiasis   . Colon polyps 2006. 2016    adenomatous and hyperplaxtic  . H. pylori infection 2016    h pylori erosive gastritis, treated with PPI, antibiotics.   . Hiatal hernia 05/05/2015    Large Hiatal Hernia found on EGD by Dr Hilarie Fredrickson (GI in [place]) in work up of melena and (+) FOBT.  Marland Kitchen Urethral polyp 11/17/2015  . 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  ablated.   . Overflow incontinence 04/28/2014  . Complicated UTI (urinary tract infection) 01/30/2016   Past Surgical History  Procedure Laterality Date  . Total abdominal hysterectomy w/ bilateral salpingoophorectomy  1964    Hysterectomy and bilateral oopherectomy at age 82 for benign reasons  .  Bladder suspension      Bladder tack x 2 (Dr Janice Norrie)  . Cystocele repair      Rectal prolapse and cyctocele adter hysterectomy requiring anterior repair Jerilynn Mages Edwinna Areola, MD)  . Rectocele repair      Rectal prolapse and cyctocele adter hysterectomy requiring anterior repair Jerilynn Mages Edwinna Areola, MD)  . Urethral dilation    . Parotid gland tumor excision  1990    S/P excision of Right Parotid Gland Benign Tumor, 1990  . Reconstruction of nose  1994     Nasal bridge reconstruction (Dr Judie Grieve, 1994) for following  Forklift accident on job. Surgery complicated by nerve damage resulting in difficulty raising right eyebrow   . Carpal tunnel release  2010    Carpel Tunnel Release of  left wrist  03/2009 (Dr Fredna Dow): Nerve   . Cataract extraction w/ intraocular lens implant  2010    Dr Charise Killian (ophth)  . Breast lumpectomy      Lumpectomy of benign Breast lumps bilaterally, Dr Bubba Camp  . Laminectomy      S/P L4-5 Laminectomy (1987) for decompression of spinal stenosis  . Colonoscopy w/ polypectomy  2006  . Parotidectomy  08/30/11    Radene Journey, MD (ENT) for recurrent right parotid pleomorphic adenoma by frozen section  . Carpal tunnel release      right wrist  . Esophagogastroduodenoscopy N/A 04/10/2016    Procedure: ESOPHAGOGASTRODUODENOSCOPY (EGD);  Surgeon: Irene Shipper, MD;  Location: [facility] ENDOSCOPY;  Service: Endoscopy;  Laterality: N/A;  . Enteroscopy N/A 04/18/2016    Procedure: ENTEROSCOPY;  Surgeon: Mauri Pole, MD;  Location: [facility] ENDOSCOPY;  Service: Endoscopy;  Laterality: N/A;  . Givens capsule study N/A 04/28/2016    Procedure: GIVENS CAPSULE STUDY;  Surgeon: Carol Ada, MD;  Location: [place];  Service: Endoscopy;  Laterality: N/A;   Family History  Problem Relation Age of Onset  . Coronary artery disease Mother   . Coronary artery disease Sister     open heart surgery  . Diabetes type II Sister   . Stroke Father 26  . Hypertension Father   . Lupus Brother   . Heart disease Brother   . Heart attack Sister    Social History  Substance Use Topics  . Smoking status: Former Smoker    Quit date: 01/14/1984  . Smokeless tobacco: Former Systems developer  . Alcohol Use: No   OB History    Gravida Para Term Preterm AB TAB SAB Ectopic Multiple Living   8 8        7       Obstetric Comments   One daughter passed away one year ago All vaginal deliveries     Review of Systems  Gastrointestinal: Positive for hematochezia.   10  Systems reviewed and are negative for acute change except as noted in the HPI.    Allergies  Nitrofurantoin  Home Medications   Prior to Admission medications   Medication Sig Start Date End Date Taking? Authorizing Provider  cefUROXime (CEFTIN) 250 MG tablet Take 1 tablet by mouth 2 (two) times daily. 05/05/16 05/11/16 Yes Historical Provider, MD  ferrous sulfate 325 (65 FE) MG tablet Take 1 tablet (325 mg total) by mouth 2 (two) times daily with a meal. 04/11/16  Yes Mercy Riding, MD  HYDROcodone-acetaminophen (NORCO) 10-325 MG tablet Take 1 tablet by mouth every 12 (twelve) hours as needed for moderate pain or severe pain. 04/16/16  Yes Lind Covert, MD  magnesium hydroxide (MILK OF MAGNESIA)  400 MG/5ML suspension Take 30 mLs by mouth daily as needed for mild constipation. Reported on 05/09/2016   Yes Historical Provider, MD  polyethylene glycol powder (GLYCOLAX/MIRALAX) powder Take 17 g by mouth daily as needed for mild constipation, moderate constipation or severe constipation. 02/14/16  Yes Blane Ohara McDiarmid, MD  esomeprazole (NEXIUM) 40 MG capsule Take 1 capsule (40 mg total) by mouth daily. 04/16/16   Olam Idler, MD   BP 116/68 mmHg  Pulse 82  Temp(Src) 98.3 F (36.8 C) (Oral)  Resp 21  Ht 4\' 11"  (1.499 m)  Wt 143 lb (64.864 kg)  BMI 28.87 kg/m2  SpO2 99%  LMP 12/17/1962 Physical Exam  Constitutional: She is oriented to person, place, and time. She appears well-developed and well-nourished. No distress.  HENT:  Head: Normocephalic and atraumatic.  Moist mucous membranes, pale tongue  Eyes: Conjunctivae are normal. Pupils are equal, round, and reactive to light.  Neck: Neck supple.  Cardiovascular: Normal rate, regular rhythm and normal heart sounds.   No murmur heard. Pulmonary/Chest: Effort normal and breath sounds normal.  Abdominal: Soft. Bowel sounds are normal. She exhibits no distension. There is no tenderness.  Genitourinary: Guaiac positive stool.  Melanotic,  formed stool in rectal vault  Chaperone was present during exam.   Musculoskeletal:  Trace bilateral lower extremity edema, no tenderness  Neurological: She is alert and oriented to person, place, and time.  Fluent speech  Skin: Skin is warm and dry. There is pallor.  Psychiatric: She has a normal mood and affect. Judgment normal.  Nursing note and vitals reviewed.   ED Course  .Critical Care Performed by: Sharlett Iles Authorized by: Sharlett Iles Total critical care time: 60 minutes Critical care time was exclusive of separately billable procedures and treating other patients. Critical care was necessary to treat or prevent imminent or life-threatening deterioration of the following conditions: GI bleed. Critical care was time spent personally by me on the following activities: development of treatment plan with patient or surrogate, discussions with consultants, discussions with primary provider, evaluation of patient's response to treatment, examination of patient, obtaining history from patient or surrogate, ordering and performing treatments and interventions, ordering and review of laboratory studies, ordering and review of radiographic studies, re-evaluation of patient's condition and review of old charts.   (including critical care time) Labs Review Labs Reviewed  COMPREHENSIVE METABOLIC PANEL - Abnormal; Notable for the following:    Glucose, Bld 103 (*)    Calcium 8.4 (*)    Total Protein 5.5 (*)    Albumin 2.5 (*)    ALT 13 (*)    All other components within normal limits  CBC - Abnormal; Notable for the following:    RBC 2.43 (*)    Hemoglobin 6.9 (*)    HCT 21.9 (*)    All other components within normal limits  POC OCCULT BLOOD, ED - Abnormal; Notable for the following:    Fecal Occult Bld POSITIVE (*)    All other components within normal limits  TYPE AND SCREEN  PREPARE RBC (CROSSMATCH)    Imaging Review No results found. I have personally  reviewed and evaluated these lab results as part of my medical decision-making.   EKG Interpretation None     Medications  HYDROcodone-acetaminophen (NORCO/VICODIN) 5-325 MG per tablet 2 tablet (2 tablets Oral Given 05/09/16 2102)    MDM   Final diagnoses:  Gastrointestinal hemorrhage, unspecified gastritis, unspecified gastrointestinal hemorrhage type  Iron deficiency anemia due to chronic  blood loss  Leg swelling   Patient with multiple recent admissions for GI bleed and recent transfer to [facility] for endoscopic procedure presents with low hemoglobin found on routine lab work today at follow-up. On exam, she was awake and alert, comfortable with normal vital signs. No abdominal tenderness on exam. She did have dark, formed stool that was Hemoccult positive. Hemoglobin was 6.6 earlier today; repeat here is 6.9. I reviewed lab work from [facility] which shows a hemoglobin of 9.0 at they have discharge. I am concerned about her dropping blood value in the setting of melena on exam. I discussed with [place] gastroenterologist, Dr. wild, who performed her recent endoscopic procedure. He agreed to evaluate the patient at [facility]. I discussed with medicine, Dr. Hollace Kinnier, who has accepted the patient in transfer. Per his recommendations, I have ordered 1u pRBC transfusion after discussion of risks and benefits w/ patient. I was later contacted by the patient's primary care provider, who had also evaluated the patient for lower extremity edema today. He sent a d-dimer which was positive. Obtained bilateral lower extremity ultrasounds which were negative for DVT. Patient agrees to transfer. Patient transferred to [facility] for further care.  Sharlett Iles, MD 05/10/16 [phone]

## 2016-05-09 NOTE — ED Notes (Signed)
Pt here for low hgb. sts that was recently admitted for lower GI bleed. sts had multiple blood transfusions.

## 2016-05-09 NOTE — Assessment & Plan Note (Signed)
Seems improved.  Will check labs

## 2016-05-09 NOTE — ED Notes (Signed)
IV team at bedside 

## 2016-05-09 NOTE — ED Notes (Signed)
[facility] arrived, EMTALA paperwork given.

## 2016-05-09 NOTE — Patient Instructions (Addendum)
Good to see you today!  Thanks for coming in.  Restart your miralax every day and the MOM as needed  Take the iron tablet twice a day  Start back on the Nexium  I will call you if your tests are not good.  Otherwise I will send you a letter.  If you do not hear from me with in 2 weeks please call our office.     Keep the legs elevated and the swelling should slowly go away.     If you have any shortness of breath or chest pain with moving then call us immediately  Come back and see Dr McDiarmid in one month

## 2016-05-09 NOTE — Progress Notes (Signed)
   Subjective:    Patient ID: Nicole Perez, female    DOB: 1933-05-14, 82 y.o.   MRN: [id number]  HPI  GI Bleed Follow up from recent hospitlizaton with transfer to [facility].  I could not find discharge summary in Care Everywhere.  Apparently the found and fixed the bleeding according to patient and daughter.  She has not seen any bleeding or melena.  Right leg swelling Was having bilateral swelling when was anemic but now her left leg is improving but her R leg is still swelling although some better than at its worst.  Both legs improve with elevaton No pain or shortness of breath or chest pain.  Has been in hospital for prlonged time   UTI Found to have uti at [facility].  Taking antibiotics.  No frequency or bleeding or pain or fever.  Anemia Taking iron once a day.  Still feels tired but better than before.  No chest pain or lightheadness   Constipation Not having regular bowel movement.  Not taking any of her regular stool medications.  No abdomen pain or vomiting  Chief Complaint noted Review of Symptoms - see HPI PMH - Smoking status noted.   Vital Signs reviewed  Review of Systems     Objective:   Physical Exam Alert nad Walking well with walker Heart - Regular rate and rhythm.  No murmurs, gallops or rubs.    Lungs:  Normal respiratory effort, chest expands symmetrically. Lungs are clear to auscultation, no crackles or wheezes. Legs R leg moderate edema at ankle pitting Mild edema on L leg.  No calf tenderness or masses       Assessment & Plan:

## 2016-05-09 NOTE — Progress Notes (Signed)
VASCULAR LAB PRELIMINARY  PRELIMINARY  PRELIMINARY  PRELIMINARY  Bilateral lower extremity venous duplex completed.    Preliminary report: Bilateral:  No obvious evidence of DVT, superficial thrombosis, or Baker's Cyst.  Technically difficult in the lower leg bilaterally due to constant drawing of the feet/ Unable to adequately visualize the peroneal veins to fully evaluate.   Glee Lashomb, [place], RVS 05/09/2016, 7:04 PM

## 2016-05-09 NOTE — Assessment & Plan Note (Signed)
Most likely due to anemia and venous insuff but given is not symetric and was in hospital for prolonged time will check d-dimer

## 2016-05-10 LAB — TYPE AND SCREEN
ABO/RH(D): O POS
Antibody Screen: NEGATIVE
UNIT DIVISION: 0

## 2016-05-10 LAB — CBC
HEMATOCRIT: 21.9 % — AB (ref 36.0–46.0)
Hemoglobin: 6.6 g/dL — CL (ref 12.0–15.0)
MCH: 27.6 pg (ref 26.0–34.0)
MCHC: 30.1 g/dL (ref 30.0–36.0)
MCV: 91.6 fL (ref 78.0–100.0)
PLATELETS: 326 10*3/uL (ref 150–400)
RBC: 2.39 MIL/uL — AB (ref 3.87–5.11)
RDW: 15 % (ref 11.5–15.5)
WBC: 6.8 10*3/uL (ref 4.0–10.5)

## 2016-05-10 NOTE — Telephone Encounter (Signed)
Please call in authorization order for PT and nursing sevices as documented in request documented by Ms Gillis Ends below.

## 2016-05-10 NOTE — Telephone Encounter (Signed)
Verbal orders left on identified VM for PT. Nicole Perez,CMA

## 2016-05-11 ENCOUNTER — Encounter: Payer: Medicare Other | Admitting: Physician Assistant

## 2016-05-12 DIAGNOSIS — D500 Iron deficiency anemia secondary to blood loss (chronic): Secondary | ICD-10-CM

## 2016-05-12 HISTORY — DX: Iron deficiency anemia secondary to blood loss (chronic): D50.0

## 2016-05-15 ENCOUNTER — Ambulatory Visit (INDEPENDENT_AMBULATORY_CARE_PROVIDER_SITE_OTHER): Payer: Medicare Other | Admitting: Family Medicine

## 2016-05-15 ENCOUNTER — Encounter: Payer: Self-pay | Admitting: Family Medicine

## 2016-05-15 VITALS — BP 127/84 | HR 79 | Temp 98.0°F | Wt 135.0 lb

## 2016-05-15 DIAGNOSIS — K922 Gastrointestinal hemorrhage, unspecified: Secondary | ICD-10-CM | POA: Diagnosis not present

## 2016-05-15 DIAGNOSIS — R531 Weakness: Secondary | ICD-10-CM | POA: Diagnosis not present

## 2016-05-15 DIAGNOSIS — K5900 Constipation, unspecified: Secondary | ICD-10-CM | POA: Insufficient documentation

## 2016-05-15 DIAGNOSIS — D649 Anemia, unspecified: Secondary | ICD-10-CM | POA: Diagnosis not present

## 2016-05-15 DIAGNOSIS — D62 Acute posthemorrhagic anemia: Secondary | ICD-10-CM | POA: Diagnosis not present

## 2016-05-15 DIAGNOSIS — G608 Other hereditary and idiopathic neuropathies: Secondary | ICD-10-CM

## 2016-05-15 DIAGNOSIS — G629 Polyneuropathy, unspecified: Secondary | ICD-10-CM

## 2016-05-15 HISTORY — DX: Constipation, unspecified: K59.00

## 2016-05-15 LAB — CBC
HCT: 28.5 % — ABNORMAL LOW (ref 35.0–45.0)
HEMOGLOBIN: 9.2 g/dL — AB (ref 11.7–15.5)
MCH: 28.4 pg (ref 27.0–33.0)
MCHC: 32.3 g/dL (ref 32.0–36.0)
MCV: 88 fL (ref 80.0–100.0)
MPV: 10.5 fL (ref 7.5–12.5)
Platelets: 317 10*3/uL (ref 140–400)
RBC: 3.24 MIL/uL — AB (ref 3.80–5.10)
RDW: 14.8 % (ref 11.0–15.0)
WBC: 7.7 10*3/uL (ref 3.8–10.8)

## 2016-05-15 NOTE — Progress Notes (Signed)
Subjective:     Patient ID: Nicole Perez, female   DOB: April 11, 1933, 82 y.o.   MRN: [id number]  HPI Anemia/GI bleed: Here to follow up after hospital admission for acute on chronic GI bleed. She was initially called to go to [facility] ED for low hgb where she was given one unit of PRBC. From [facility] she was transferred to [facility] where she got another unit of blood. She also had a repeat EGD with clippings of her Dieulafoy. Per patient she had 3 clips previously and now she got extra 7 clippings. Since then she has been fine. No N/V, no diarrhea, no belly pain, appetite is fine. Denies weakness or dizziness. No blood in stool but she has dark stool and she is also on iron pills. She has been compliant with her Nexium. Constipation:She is compliant with Milk of magnesium and Miralax. She is moving her bowel adequately  Now. Denies any other GI concern. Leg pain: Patient stated she was started on Lyrica at [facility] and since then her pain has improved a lot. Denies any other concern.  Current Outpatient Prescriptions on File Prior to Visit  Medication Sig Dispense Refill  . esomeprazole (NEXIUM) 40 MG capsule Take 1 capsule (40 mg total) by mouth daily. 30 capsule 2  . ferrous sulfate 325 (65 FE) MG tablet Take 1 tablet (325 mg total) by mouth 2 (two) times daily with a meal. 30 tablet 11  . HYDROcodone-acetaminophen (NORCO) 10-325 MG tablet Take 1 tablet by mouth every 12 (twelve) hours as needed for moderate pain or severe pain. 60 tablet 0  . magnesium hydroxide (MILK OF MAGNESIA) 400 MG/5ML suspension Take 30 mLs by mouth daily as needed for mild constipation. Reported on 05/09/2016    . polyethylene glycol powder (GLYCOLAX/MIRALAX) powder Take 17 g by mouth daily as needed for mild constipation, moderate constipation or severe constipation. 3350 g 3   No current facility-administered medications on file prior to visit.   Past Medical History  Diagnosis Date  . Essential hypertension, benign 04/18/2010  . Lichen  sclerosus et atrophicus of the vulva   . BACK PAIN, CHRONIC 04/18/2010    degenerative spine disease, spinal stenosis throughout spine  . DEGENERATIVE JOINT DISEASE, HIPS 09/11/2007    Multilevel degenerative spine dz and spinal stenosis  . INSOMNIA, CHRONIC 04/18/2010  . Parotid adenoma 1990, 2012    Right parotid, recurrent parotid pleimorphic adenoma.   . Osteoarthritis, multiple sites 08/11/2013  . Cystocele 08/11/2013  . Acute renal failure ([place]) 02/23/2014  . History of pneumonia 07/26/2011  . Rectal fissure 12/16/2013  . UTI (urinary tract infection) 02/22/2014  . 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 Hearing loss sensory, bilateral 07/26/2011    Right >> Left.  Left ear hearing aid b/c work discrimination in       R. ear is very poor. Audiologist-Stephanie Nance at [facility] in [place].  (05/10/2010)   . HYPERLIPIDEMIA 05/10/2010    Qualifier: Diagnosis of  By: McDiarmid MD, Sherren Mocha    . Incontinence overflow, urine 04/28/2014  . Incomplete emptying of bladder 02/09/2014  . Paresthesia of both feet 08/06/2014  . Orthostatic hypotension 08/06/2014  . Blepharitis 11/16/2014    Diagnosis by optometrist, Renaldo Harrison on exam 11/13/2014  . Dry eye syndrome 11/16/2014  . Glaucoma suspect 11/16/2014  . Blood in stool 12/20/2014  . Cholelithiasis   . Colon polyps 2006. 2016  adenomatous and hyperplaxtic  . H. pylori infection 2016    h pylori erosive gastritis, treated with PPI, antibiotics.   . Hiatal hernia 05/05/2015    Large Hiatal Hernia found on EGD by Dr Hilarie Fredrickson (GI in [place]) in work up of melena and (+) FOBT.  Marland Kitchen Urethral polyp 11/17/2015  . 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  ablated.   . Overflow incontinence 04/28/2014  . Complicated UTI (urinary tract infection) 01/30/2016     Review of Systems  Respiratory: Negative.   Cardiovascular: Negative.   Gastrointestinal: Negative.   Genitourinary:  Negative.   Neurological: Negative for dizziness and light-headedness.  All other systems reviewed and are negative.      Filed Vitals:   05/15/16 1030  BP: 127/84  Pulse: 79  Temp: 98 F (36.7 C)  TempSrc: Oral  Weight: 135 lb (61.236 kg)  SpO2: 98%    Objective:   Physical Exam  Constitutional: She is oriented to person, place, and time. She appears well-developed. No distress.  HENT:  No conjunctival or oral pallor.  Cardiovascular: Normal rate, regular rhythm and normal heart sounds.   No murmur heard. Pulmonary/Chest: Effort normal and breath sounds normal. No respiratory distress. She has no wheezes.  Abdominal: Soft. Bowel sounds are normal. She exhibits no distension and no mass. There is no tenderness.  Musculoskeletal: Normal range of motion. She exhibits no edema.  Neurological: She is oriented to person, place, and time.  Nursing note and vitals reviewed.      Assessment:     Anemia GI bleed Constipation Neuropathic pain     Plan:     Check problem list.

## 2016-05-15 NOTE — Patient Instructions (Signed)
Gastrointestinal Bleeding °Gastrointestinal bleeding is bleeding somewhere along the path that food travels through the body (digestive tract). This path is anywhere between the mouth and the opening of the butt (anus). You may have blood in your throw up (vomit) or in your poop (stools). If there is a lot of bleeding, you may need to stay in the hospital. °HOME CARE °· Only take medicine as told by your doctor. °· Eat foods with fiber such as whole grains, fruits, and vegetables. You can also try eating 1 to 3 prunes a day. °· Drink enough fluids to keep your pee (urine) clear or pale yellow. °GET HELP RIGHT AWAY IF:  °· Your bleeding gets worse. °· You feel dizzy, weak, or you pass out (faint). °· You have bad cramps in your back or belly (abdomen). °· You have large blood clumps (clots) in your poop. °· Your problems are getting worse. °MAKE SURE YOU:  °· Understand these instructions. °· Will watch your condition. °· Will get help right away if you are not doing well or get worse. °  °This information is not intended to replace advice given to you by your health care provider. Make sure you discuss any questions you have with your health care provider. °  °Document Released: 09/11/2008 Document Revised: 11/19/2012 Document Reviewed: 05/23/2015 °Elsevier Interactive Patient Education ©2016 Elsevier Inc. ° °

## 2016-05-15 NOTE — Assessment & Plan Note (Signed)
Per documentation she has been extensively worked up for this. Doing well on Lyrica. Continue same for now. F/U as needed.

## 2016-05-15 NOTE — Assessment & Plan Note (Signed)
Secondary to Jejunal Dieulafoy lesion s/p clipping. Currently no acute bleed. Continue Nexium and Ferrous sulphate as instructed. CBC rechecked today. F/U as needed. Return precaution discussed.

## 2016-05-15 NOTE — Assessment & Plan Note (Signed)
Improved since hospital discharge on Miralax and MOM. Continue same.

## 2016-05-15 NOTE — Assessment & Plan Note (Signed)
Acute on chronic anemia. S/P hospitalization and blood transfusion. Hospital admission note from [place] received and reviewed by me. Patient appears clinically stable and hemodynamically stable. CBC rechecked today. I will contact her with result.

## 2016-05-16 ENCOUNTER — Telehealth: Payer: Self-pay | Admitting: Family Medicine

## 2016-05-16 ENCOUNTER — Telehealth: Payer: Self-pay | Admitting: *Deleted

## 2016-05-16 NOTE — Telephone Encounter (Signed)
Nicole Perez/B was discontinued from hospital because she completed treatment course and repeat test was normal. If she is symptomatic have her come in to see PCP soon for reassessment and management.

## 2016-05-16 NOTE — Telephone Encounter (Signed)
Pt wants a antibiotic for her bladder infection like they gave her in the hospital. Was seen yesterday but wasn't given anything. Jayel Scaduto Kennon Holter, CMA

## 2016-05-16 NOTE — Telephone Encounter (Signed)
Result discussed with patient. Hgb at 9 which seems to be baseline for her. Patient advised to return soon if she has rectal bleed or dark stool or if she feels weak, tired or dizzy. She verbalized understanding.

## 2016-05-16 NOTE — Telephone Encounter (Signed)
Will forward to MD to advise. Dontez Hauss,CMA  

## 2016-05-16 NOTE — Telephone Encounter (Signed)
Made an apt for pt with same day provider tomm, PCP is booked up. Page, cma.

## 2016-05-17 ENCOUNTER — Ambulatory Visit (INDEPENDENT_AMBULATORY_CARE_PROVIDER_SITE_OTHER): Payer: Medicare Other | Admitting: Family Medicine

## 2016-05-17 ENCOUNTER — Encounter: Payer: Self-pay | Admitting: Family Medicine

## 2016-05-17 VITALS — BP 120/45 | HR 57 | Temp 98.1°F | Wt 135.0 lb

## 2016-05-17 DIAGNOSIS — R300 Dysuria: Secondary | ICD-10-CM | POA: Diagnosis not present

## 2016-05-17 DIAGNOSIS — R531 Weakness: Secondary | ICD-10-CM | POA: Diagnosis not present

## 2016-05-17 DIAGNOSIS — T83511A Infection and inflammatory reaction due to indwelling urethral catheter, initial encounter: Secondary | ICD-10-CM

## 2016-05-17 DIAGNOSIS — D649 Anemia, unspecified: Secondary | ICD-10-CM | POA: Diagnosis not present

## 2016-05-17 DIAGNOSIS — N390 Urinary tract infection, site not specified: Secondary | ICD-10-CM

## 2016-05-17 LAB — POCT URINALYSIS DIPSTICK
BILIRUBIN UA: NEGATIVE
Glucose, UA: NEGATIVE
Ketones, UA: NEGATIVE
NITRITE UA: NEGATIVE
PH UA: 5.5
PROTEIN UA: NEGATIVE
Spec Grav, UA: <=1.005
Urobilinogen, UA: 0.2

## 2016-05-17 MED ORDER — CEPHALEXIN 250 MG PO CAPS: 250.0000 mg | ORAL_CAPSULE | Freq: Four times a day (QID) | ORAL | Status: DC

## 2016-05-17 NOTE — Patient Instructions (Signed)
Start taking Keflex 4 times per day. If the culture notes that it is not sensitive to that medication, I will call you and change the antibiotic.  Follow up with your PCP in 1 month.

## 2016-05-17 NOTE — Progress Notes (Signed)
Subjective: [id number]  HPI: Patient is a 82 y.o. female with a neurogenic bladder presenting to clinic today for a same day appt for dysuria.  She had dysuria during her last hospitalization on 04/16/16. She was treated with ceftriaxone then keflex to complete a 7d course. She felt completely better, however then started having dysuria shortly after discharge, around 05/02/16. She denies fevers, chills, N/V, or flank pain. No hematuria. She has a neurogenic bladder and self caths, therefore she believes that is the issue.   Social History: former smoker  Health Maintenance: up to date   ROS: All other systems reviewed and are negative besides that noted in HPI.  Past Medical History Patient Active Problem List   Diagnosis Date Noted  . Constipation 05/15/2016  . Pedal edema 05/09/2016  . Gastrointestinal hemorrhage   . Gastrointestinal hemorrhage associated with angiodysplasia of stomach and duodenum   . Angiodysplasia of stomach and duodenum with bleeding   . Bleeding gastrointestinal   . Peptic ulcer disease with hemorrhage   . GI bleed 04/17/2016  . AKI (acute kidney injury) ([place]) 04/17/2016  . ST segment depression 04/17/2016  . Gastrointestinal hemorrhage with melena   . Gastric AVM 04/16/2016  . Pain in the chest   . Melena   . Junctional bradycardia   . Acute blood loss anemia   . Angiodysplasia of duodenum with hemorrhage   . Symptomatic anemia 04/09/2016  . Chest pain 04/09/2016  . Complicated UTI (urinary tract infection) 01/30/2016  . Gross hematuria 11/17/2015  . History of colonic polyps 05/05/2015  . Hiatal hernia 05/05/2015  . Dry eye syndrome 11/16/2014  . Glaucoma suspect, bilateral 11/16/2014  . Peroneal neuropathy 09/30/2014  . Cervical spondylosis without myelopathy 08/07/2014  . Right leg weakness 08/06/2014  . At risk for falls 08/06/2014  . Memory impairment 08/06/2014  . Peripheral painful sensory neuropathy 08/06/2014  . Bladder neurogenous  02/09/2014  . Osteoarthritis, multiple sites 08/11/2013  . Cystocele 08/11/2013  . Osteoarthritis of both knees 04/22/2013  . Obesity, unspecified 04/22/2013  . Vitamin D deficiency 09/30/2012  . Hearing loss sensory, bilateral 07/26/2011  . Spinal stenosis of thoracic region 07/26/2011  . Spinal stenosis of lumbar region 07/26/2011  . Lichen sclerosus et atrophicus of the vulva   . HYPERLIPIDEMIA 05/10/2010  . Osteoarthritis 04/21/2010  . INSOMNIA, CHRONIC 04/18/2010  . Essential hypertension, benign 04/18/2010  . Chronic back pain 04/18/2010  . Osteoarthritis of both hips 09/11/2007    Medications- reviewed and updated Current Outpatient Prescriptions  Medication Sig Dispense Refill  . esomeprazole (NEXIUM) 40 MG capsule Take 1 capsule (40 mg total) by mouth daily. 30 capsule 2  . ferrous sulfate 325 (65 FE) MG tablet Take 1 tablet (325 mg total) by mouth 2 (two) times daily with a meal. 30 tablet 11  . HYDROcodone-acetaminophen (NORCO) 10-325 MG tablet Take 1 tablet by mouth every 12 (twelve) hours as needed for moderate pain or severe pain. 60 tablet 0  . magnesium hydroxide (MILK OF MAGNESIA) 400 MG/5ML suspension Take 30 mLs by mouth daily as needed for mild constipation. Reported on 05/09/2016    . polyethylene glycol powder (GLYCOLAX/MIRALAX) powder Take 17 g by mouth daily as needed for mild constipation, moderate constipation or severe constipation. 3350 g 3  . pregabalin (LYRICA) 50 MG capsule Take 50 mg by mouth 2 (two) times daily.     No current facility-administered medications for this visit.    Objective: Office vital signs reviewed. BP 120/45 mmHg  Pulse 57  Temp(Src) 98.1 F (36.7 C) (Oral)  Wt 135 lb (61.236 kg)  LMP 12/17/1962   Physical Examination:  General: Awake, alert, well- nourished, NAD Abdomen: +BS, soft, nondistended, nontender. No CVA tenderness.  U/A:large LE, negative nitrite  Assessment/Plan: UTI: patient notes dysuria and has a U/A  consistent with UTI. No evidence of pyelonephritis. Unfortunately, due to urinary incontinence we were unable to get a sufficient sample to view under microscopy or send for culture. Will empirically treat with Keflex QID x 7 day course as her previous UTIs have been sensitive to this. Discussed RTC precautions. In the future given recurrent UTIs, may consider prophylactic treatment.    Archie Patten PGY-2, [place]

## 2016-05-18 DIAGNOSIS — R531 Weakness: Secondary | ICD-10-CM | POA: Diagnosis not present

## 2016-05-18 DIAGNOSIS — D649 Anemia, unspecified: Secondary | ICD-10-CM | POA: Diagnosis not present

## 2016-05-21 DIAGNOSIS — R531 Weakness: Secondary | ICD-10-CM | POA: Diagnosis not present

## 2016-05-21 DIAGNOSIS — D649 Anemia, unspecified: Secondary | ICD-10-CM | POA: Diagnosis not present

## 2016-05-22 ENCOUNTER — Encounter: Payer: Self-pay | Admitting: Family Medicine

## 2016-05-22 DIAGNOSIS — D649 Anemia, unspecified: Secondary | ICD-10-CM | POA: Diagnosis not present

## 2016-05-22 DIAGNOSIS — R531 Weakness: Secondary | ICD-10-CM | POA: Diagnosis not present

## 2016-05-24 DIAGNOSIS — R531 Weakness: Secondary | ICD-10-CM | POA: Diagnosis not present

## 2016-05-24 DIAGNOSIS — D649 Anemia, unspecified: Secondary | ICD-10-CM | POA: Diagnosis not present

## 2016-05-28 DIAGNOSIS — D649 Anemia, unspecified: Secondary | ICD-10-CM | POA: Diagnosis not present

## 2016-05-28 DIAGNOSIS — R531 Weakness: Secondary | ICD-10-CM | POA: Diagnosis not present

## 2016-05-29 DIAGNOSIS — D649 Anemia, unspecified: Secondary | ICD-10-CM | POA: Diagnosis not present

## 2016-05-29 DIAGNOSIS — R531 Weakness: Secondary | ICD-10-CM | POA: Diagnosis not present

## 2016-05-30 DIAGNOSIS — D649 Anemia, unspecified: Secondary | ICD-10-CM | POA: Diagnosis not present

## 2016-05-30 DIAGNOSIS — R531 Weakness: Secondary | ICD-10-CM | POA: Diagnosis not present

## 2016-06-01 DIAGNOSIS — R339 Retention of urine, unspecified: Secondary | ICD-10-CM | POA: Diagnosis not present

## 2016-06-04 DIAGNOSIS — R531 Weakness: Secondary | ICD-10-CM | POA: Diagnosis not present

## 2016-06-04 DIAGNOSIS — D649 Anemia, unspecified: Secondary | ICD-10-CM | POA: Diagnosis not present

## 2016-06-11 ENCOUNTER — Ambulatory Visit (INDEPENDENT_AMBULATORY_CARE_PROVIDER_SITE_OTHER): Payer: Medicare Other | Admitting: Family Medicine

## 2016-06-11 ENCOUNTER — Encounter: Payer: Self-pay | Admitting: Family Medicine

## 2016-06-11 VITALS — BP 153/61 | HR 54 | Temp 98.0°F | Ht 59.0 in | Wt 150.8 lb

## 2016-06-11 DIAGNOSIS — M4804 Spinal stenosis, thoracic region: Secondary | ICD-10-CM

## 2016-06-11 DIAGNOSIS — D500 Iron deficiency anemia secondary to blood loss (chronic): Secondary | ICD-10-CM | POA: Diagnosis not present

## 2016-06-11 DIAGNOSIS — G608 Other hereditary and idiopathic neuropathies: Secondary | ICD-10-CM

## 2016-06-11 DIAGNOSIS — M160 Bilateral primary osteoarthritis of hip: Secondary | ICD-10-CM

## 2016-06-11 DIAGNOSIS — M47812 Spondylosis without myelopathy or radiculopathy, cervical region: Secondary | ICD-10-CM

## 2016-06-11 DIAGNOSIS — M4806 Spinal stenosis, lumbar region: Secondary | ICD-10-CM

## 2016-06-11 DIAGNOSIS — G629 Polyneuropathy, unspecified: Secondary | ICD-10-CM

## 2016-06-11 DIAGNOSIS — M549 Dorsalgia, unspecified: Secondary | ICD-10-CM

## 2016-06-11 DIAGNOSIS — G8929 Other chronic pain: Secondary | ICD-10-CM

## 2016-06-11 DIAGNOSIS — K922 Gastrointestinal hemorrhage, unspecified: Secondary | ICD-10-CM

## 2016-06-11 DIAGNOSIS — M48061 Spinal stenosis, lumbar region without neurogenic claudication: Secondary | ICD-10-CM

## 2016-06-11 DIAGNOSIS — M159 Polyosteoarthritis, unspecified: Secondary | ICD-10-CM

## 2016-06-11 DIAGNOSIS — M150 Primary generalized (osteo)arthritis: Secondary | ICD-10-CM

## 2016-06-11 DIAGNOSIS — M170 Bilateral primary osteoarthritis of knee: Secondary | ICD-10-CM

## 2016-06-11 LAB — CBC
HCT: 34 % — ABNORMAL LOW (ref 35.0–45.0)
Hemoglobin: 10.8 g/dL — ABNORMAL LOW (ref 11.7–15.5)
MCH: 27.1 pg (ref 27.0–33.0)
MCHC: 31.8 g/dL — AB (ref 32.0–36.0)
MCV: 85.4 fL (ref 80.0–100.0)
MPV: 11.2 fL (ref 7.5–12.5)
PLATELETS: 254 10*3/uL (ref 140–400)
RBC: 3.98 MIL/uL (ref 3.80–5.10)
RDW: 14.6 % (ref 11.0–15.0)
WBC: 5.8 10*3/uL (ref 3.8–10.8)

## 2016-06-11 LAB — IRON AND TIBC
%SAT: 6 % — AB (ref 11–50)
Iron: 18 ug/dL — ABNORMAL LOW (ref 45–160)
TIBC: 295 ug/dL (ref 250–450)
UIBC: 277 ug/dL (ref 125–400)

## 2016-06-11 LAB — FERRITIN: Ferritin: 76 ng/mL (ref 20–288)

## 2016-06-11 MED ORDER — PREGABALIN 50 MG PO CAPS: 50.0000 mg | ORAL_CAPSULE | Freq: Two times a day (BID) | ORAL | Status: DC

## 2016-06-11 MED ORDER — HYDROCODONE-ACETAMINOPHEN 10-325 MG PO TABS: 1.0000 | ORAL_TABLET | Freq: Two times a day (BID) | ORAL | Status: DC | PRN

## 2016-06-11 NOTE — Patient Instructions (Signed)
Checking blood counts today Refilled norco and lyrica  Ordered another CBC for you to get monthly for the next 2 months. Follow up with Dr. McDiarmid in 2 months, sooner if needed  Be well, Dr. Ardelia Mems

## 2016-06-12 DIAGNOSIS — D649 Anemia, unspecified: Secondary | ICD-10-CM | POA: Diagnosis not present

## 2016-06-12 DIAGNOSIS — R531 Weakness: Secondary | ICD-10-CM | POA: Diagnosis not present

## 2016-06-13 DIAGNOSIS — D649 Anemia, unspecified: Secondary | ICD-10-CM | POA: Diagnosis not present

## 2016-06-13 DIAGNOSIS — R531 Weakness: Secondary | ICD-10-CM | POA: Diagnosis not present

## 2016-06-13 NOTE — Assessment & Plan Note (Signed)
Stable. Refill norco x86mo. Follow up with PCP for further refills.

## 2016-06-13 NOTE — Assessment & Plan Note (Addendum)
No recurrent bleeding. Patient feels well. Stools are black, though this is expected with iron. Will check iron studies & CBC today, and then CBC again monthly for 2 more months, at which point will defer further monitoring to PCP. Future standing orders entered so that patient may schedule just a lab visit for these lab draws.

## 2016-06-13 NOTE — Progress Notes (Signed)
Date of Visit: 06/11/2016   HPI:  Patient presents to follow up on anemia. She had two hospitalizations at [facility] in May, undergoing clipping of jejunal bleeds each time. Main reason for coming in today is to have her hemoglobin checked (she thought it was just a lab visit).  Reports having bowel movements normally. Taking iron twice daily. Stools are black, which she attributes to iron. Denies shortness of breath, further blood in stool. Uses miralax to help with constipation resulting from the iron.  Wants to set up regular hgb checks to monitor things.   Norco refill - needs refill. Takes twice daily for spinal stenosis. Also would like refill on lyrica. Takes this twice daily, helps her pain. This was started for neuropathy while she was at [facility].  ROS: See HPI.  [place]: history of hyperlipidemia, insomnia, hypertension, arthritis, obesity, GI bleed, neurogenic bladder  PHYSICAL EXAM: BP 153/61 mmHg  Pulse 54  Temp(Src) 98 F (36.7 C) (Oral)  Ht 4\' 11"  (1.499 m)  Wt 150 lb 12.8 oz (68.402 kg)  BMI 30.44 kg/m2  LMP 12/17/1962 Gen: NAD, pleasant, cooperative, well appearing HEENT: normocephalic, atraumatic, mmm Heart:  Regular rate and rhythm, no murmur Lungs: clear to auscultation bilaterally, normal work of breathing  Neuro: alert, grossly nonfocal, speech normal ABd: soft nontender to palpation, no masses or organomegaly Ext: mild edema bilateral lower extremities, R>L (chronic per patient)   ASSESSMENT/PLAN:  Peripheral painful sensory neuropathy Doing well. Refill lyrica per patient request.  Chronic back pain Stable. Refill norco x52mo. Follow up with PCP for further refills.  Gastrointestinal hemorrhage No recurrent bleeding. Patient feels well. Stools are black, though this is expected with iron. Will check iron studies & CBC today, and then CBC again monthly for 2 more months, at which point will defer further monitoring to PCP. Future standing orders entered so that  patient may schedule just a lab visit for these lab draws.   FOLLOW UP: Follow up in 1 mo for chronic pain with PCP Lab visits monthly x2 more months   [place] J. Ardelia Mems, [place]

## 2016-06-13 NOTE — Assessment & Plan Note (Addendum)
Doing well. Refill lyrica per patient request.

## 2016-06-14 ENCOUNTER — Encounter: Payer: Self-pay | Admitting: Cardiology

## 2016-06-14 ENCOUNTER — Ambulatory Visit (INDEPENDENT_AMBULATORY_CARE_PROVIDER_SITE_OTHER): Payer: Medicare Other | Admitting: Cardiology

## 2016-06-14 VITALS — BP 142/70 | HR 80 | Ht 59.0 in | Wt 152.8 lb

## 2016-06-14 DIAGNOSIS — R9431 Abnormal electrocardiogram [ECG] [EKG]: Secondary | ICD-10-CM

## 2016-06-14 NOTE — Progress Notes (Signed)
06/14/2016 Nicole Perez   11-23-1933  [id number]  Primary Physician MCDIARMID,TODD D, MD Primary Cardiologist: Dr. Debara Pickett   Reason for Visit/CC: [facility] f/u for Anemia: Abnormal EKG  HPI:   Nicole Perez is a 82 y.o. female with a history of HTN, bradycardia on diltiazem XR, recurrent GI bleeds 2/2 angiodysplasia s/p argon plasma coagulation (04/10/16) and no past cardiac history who presented to [facility] on 04/16/16 with chest pain, SOB and abdominal pain and found to be profoundly anemic. Cardiology was consulted for an "abnormal ECG."  There was marked diffuse ST depression in context of acute GI bleed and severe anemia. EKG changes resolved with treatment of her anemia.  She was seen by Dr. Burt Knack in consultation who recommended outpatient Myoview for risk stratification and office FU visit after she recovered from acute illness.  She presents back to clinic for post hospital f/u. She notes that she has done well. No further issues with bleeding. Recent f/u CBC showed continued improvement on Hgb, now at 10.8 (previously in the 6 ragne). Fe is still low but she is on daily Fe supplementation. She denies any recurrent CP. No dyspnea. EKG shows NSR. No ST depressions.      Current Outpatient Prescriptions  Medication Sig Dispense Refill  . esomeprazole (NEXIUM) 40 MG capsule Take 1 capsule (40 mg total) by mouth daily. 30 capsule 2  . ferrous sulfate 325 (65 FE) MG tablet Take 1 tablet (325 mg total) by mouth 2 (two) times daily with a meal. 30 tablet 11  . HYDROcodone-acetaminophen (NORCO) 10-325 MG tablet Take 1 tablet by mouth every 12 (twelve) hours as needed for moderate pain or severe pain. 60 tablet 0  . magnesium hydroxide (MILK OF MAGNESIA) 400 MG/5ML suspension Take 30 mLs by mouth daily as needed for mild constipation. Reported on 05/09/2016    . polyethylene glycol powder (GLYCOLAX/MIRALAX) powder Take 17 g by mouth daily as needed for mild constipation, moderate constipation or  severe constipation. 3350 g 3  . pregabalin (LYRICA) 50 MG capsule Take 1 capsule (50 mg total) by mouth 2 (two) times daily. 60 capsule 3   No current facility-administered medications for this visit.    Allergies  Allergen Reactions  . Nitrofurantoin Other (See Comments)    Malaise and profound fatigue     Social History   Social History  . Marital Status: Widowed    Spouse Name: N/A  . Number of Children: N/A  . Years of Education: N/A   Occupational History  . retired    Social History Main Topics  . Smoking status: Former Smoker    Quit date: 01/14/1984  . Smokeless tobacco: Former Systems developer  . Alcohol Use: No  . Drug Use: No  . Sexual Activity: No     Comment: hysterectomy   Other Topics Concern  . Not on file   Social History Narrative   Has 8 children   [name], [name], involved in her care   4 brothers, 5 sisters, 3 deceased siblings   Cares for her great-grandchildren during the day   Widowed in 2009   Lives alone.   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  Review of Systems: General: negative for chills, fever, night sweats or weight changes.  Cardiovascular: negative for chest pain, dyspnea on exertion, edema, orthopnea, palpitations, paroxysmal nocturnal dyspnea or shortness of breath Dermatological: negative for rash Respiratory: negative for cough or wheezing Urologic: negative for hematuria Abdominal: negative for nausea, vomiting, diarrhea, bright red blood per rectum, melena, or hematemesis Neurologic: negative for visual changes, syncope, or dizziness All other systems reviewed and are otherwise negative except as noted above.    Last menstrual period 12/17/1962.  General appearance: alert, cooperative and no distress Neck: no carotid bruit and no JVD Lungs:  clear to auscultation bilaterally Heart: regular rate and rhythm, S1, S2 normal, no murmur, click, rub or gallop Extremities: no LEE Pulses: 2+ and symmetric Skin: warm and dry Neurologic: Grossly normal  EKG NSR. No ST depressions.   ASSESSMENT AND PLAN:   1. Abnormal EKG: Patient had marked ST depression on recent hospital EKG in the setting of severe anemia 2/2 GIB. Hbg continues to improve, now at 10.8. EKG changes resolved. No recurrent CP. Per Dr. Antionette Char recommendation, we will plan for a NST to r/o underling CAD. Patient wants to wait until after the holiday. Will plan on stress test in 2 weeks. F/u in 3 weeks.     Lyda Jester PA-C 06/14/2016 1:29 PM

## 2016-06-14 NOTE — Patient Instructions (Signed)
Medication Instructions:  Your physician recommends that you continue on your current medications as directed. Please refer to the Current Medication list given to you today.   Labwork: NONE  Testing/Procedures: Your physician has requested that you have a lexiscan myoview. For further information please visit [email or web address]. Please follow instruction sheet, as given.    Follow-Up: Ellen Henri, PA IN ABOUT 3-4 WEEKS   Any Other Special Instructions Will Be Listed Below (If Applicable).     If you need a refill on your cardiac medications before your next appointment, please call your pharmacy.

## 2016-06-15 ENCOUNTER — Encounter: Payer: Self-pay | Admitting: Family Medicine

## 2016-06-18 ENCOUNTER — Encounter: Payer: Self-pay | Admitting: Family Medicine

## 2016-06-28 ENCOUNTER — Telehealth (HOSPITAL_COMMUNITY): Payer: Self-pay | Admitting: *Deleted

## 2016-06-28 ENCOUNTER — Telehealth: Payer: Self-pay | Admitting: Family Medicine

## 2016-06-28 NOTE — Telephone Encounter (Signed)
Daughter called because her mother has another bladder infection and would like to know if the doctor could call in something for her. jw

## 2016-06-28 NOTE — Telephone Encounter (Signed)
Patient's daughter given detailed instructions per Myocardial Perfusion Study Information Sheet for the test on 07/03/16 at 1000. Patient notified to arrive 15 minutes early and that it is imperative to arrive on time for appointment to keep from having the test rescheduled.  If you need to cancel or reschedule your appointment, please call the office within 24 hours of your appointment. Failure to do so may result in a cancellation of your appointment, and a $50 no show fee. Patient verbalized understanding.Elbert Spickler, [place]e [place]

## 2016-06-29 ENCOUNTER — Encounter (HOSPITAL_COMMUNITY): Payer: Self-pay | Admitting: Emergency Medicine

## 2016-06-29 ENCOUNTER — Ambulatory Visit (HOSPITAL_COMMUNITY)
Admission: EM | Admit: 2016-06-29 | Discharge: 2016-06-29 | Disposition: A | Discharge status: Home / Self Care | Payer: Medicare Other | Attending: Internal Medicine | Admitting: Internal Medicine

## 2016-06-29 DIAGNOSIS — Z9889 Other specified postprocedural states: Secondary | ICD-10-CM | POA: Insufficient documentation

## 2016-06-29 DIAGNOSIS — Z87891 Personal history of nicotine dependence: Secondary | ICD-10-CM | POA: Diagnosis not present

## 2016-06-29 DIAGNOSIS — N390 Urinary tract infection, site not specified: Secondary | ICD-10-CM | POA: Insufficient documentation

## 2016-06-29 DIAGNOSIS — Z90710 Acquired absence of both cervix and uterus: Secondary | ICD-10-CM | POA: Insufficient documentation

## 2016-06-29 DIAGNOSIS — Z79899 Other long term (current) drug therapy: Secondary | ICD-10-CM | POA: Insufficient documentation

## 2016-06-29 LAB — POCT URINALYSIS DIP (DEVICE)
BILIRUBIN URINE: NEGATIVE
GLUCOSE, UA: NEGATIVE mg/dL
Ketones, ur: NEGATIVE mg/dL
NITRITE: POSITIVE — AB
Protein, ur: NEGATIVE mg/dL
SPECIFIC GRAVITY, URINE: 1.01 (ref 1.005–1.030)
UROBILINOGEN UA: 0.2 mg/dL (ref 0.0–1.0)
pH: 6.5 (ref 5.0–8.0)

## 2016-06-29 MED ORDER — CEPHALEXIN 500 MG PO CAPS: 500.0000 mg | ORAL_CAPSULE | Freq: Four times a day (QID) | ORAL | Status: DC

## 2016-06-29 NOTE — ED Provider Notes (Signed)
CSN: [id number]     Arrival date & time 06/29/16  1546 History   First MD Initiated Contact with Patient 06/29/16 1720     Chief Complaint  Patient presents with  . Urinary Tract Infection   (Consider location/radiation/quality/duration/timing/severity/associated sxs/prior Treatment) Patient is a 82 y.o. female presenting with urinary tract infection. The history is provided by the patient. No language interpreter was used.  Urinary Tract Infection Pain quality:  Aching Pain severity:  Moderate Onset quality:  Gradual Duration:  2 days Timing:  Constant Progression:  Worsening Chronicity:  New Recent urinary tract infections: yes   Relieved by:  Nothing Worsened by:  Nothing tried Ineffective treatments:  None tried Urinary symptoms: frequent urination   Associated symptoms: no abdominal pain and no vomiting   Risk factors: not single kidney and no urinary catheter     Past Medical History  Diagnosis Date  . Essential hypertension, benign 04/18/2010  . Lichen sclerosus et atrophicus of the vulva   . BACK PAIN, CHRONIC 04/18/2010    degenerative spine disease, spinal stenosis throughout spine  . DEGENERATIVE JOINT DISEASE, HIPS 09/11/2007    Multilevel degenerative spine dz and spinal stenosis  . INSOMNIA, CHRONIC 04/18/2010  . Parotid adenoma 1990, 2012    Right parotid, recurrent parotid pleimorphic adenoma.   . Osteoarthritis, multiple sites 08/11/2013  . Cystocele 08/11/2013  . Acute renal failure ([place]) 02/23/2014  . History of pneumonia 07/26/2011  . Rectal fissure 12/16/2013  . UTI (urinary tract infection) 02/22/2014  . 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 Hearing loss sensory, bilateral 07/26/2011    Right >> Left.  Left ear hearing aid b/c work discrimination in       R. ear is very poor. Audiologist-Stephanie Nance at [facility] in [place].  (05/10/2010)   . HYPERLIPIDEMIA 05/10/2010    Qualifier:  Diagnosis of  By: McDiarmid MD, Sherren Mocha    . Incontinence overflow, urine 04/28/2014  . Incomplete emptying of bladder 02/09/2014  . Paresthesia of both feet 08/06/2014  . Orthostatic hypotension 08/06/2014  . Blepharitis 11/16/2014    Diagnosis by optometrist, Renaldo Harrison on exam 11/13/2014  . Dry eye syndrome 11/16/2014  . Glaucoma suspect 11/16/2014  . Blood in stool 12/20/2014  . Cholelithiasis   . Colon polyps 2006. 2016    adenomatous and hyperplaxtic  . H. pylori infection 2016    h pylori erosive gastritis, treated with PPI, antibiotics.   . Hiatal hernia 05/05/2015    Large Hiatal Hernia found on EGD by Dr Hilarie Fredrickson (GI in [place]) in work up of melena and (+) FOBT.  Marland Kitchen Urethral polyp 11/17/2015  . 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  ablated.   . Overflow incontinence 04/28/2014  . Complicated UTI (urinary tract infection) 01/30/2016  . AVM (arteriovenous malformation) of duodenum, acquired 04/09/16    Dr Henrene Pastor (GI) argon plasm coagulation via EGD  . Duodenal ulcer 04/18/16    visible vessel on EGD  . Abnormal angiography 04/22/16    third order arteries pancreatoduodenal occluded br embolization  . 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   . Demand ischemia ([place]) 05/03/16    [facility] noted during GIB  . Junctional rhythm 05/03/16    [facility]iology recommeded outpatient echo and nuclear stress test   Past Surgical History  Procedure Laterality Date  . Total abdominal  hysterectomy w/ bilateral salpingoophorectomy  1964    Hysterectomy and bilateral oopherectomy at age 4 for benign reasons  . Bladder suspension      Bladder tack x 2 (Dr Janice Norrie)  . Cystocele repair      Rectal prolapse and cyctocele adter hysterectomy requiring anterior repair Jerilynn Mages Edwinna Areola, MD)  . Rectocele repair      Rectal prolapse and cyctocele adter hysterectomy requiring anterior repair Jerilynn Mages Edwinna Areola, MD)  . Urethral dilation    . Parotid gland tumor excision   1990    S/P excision of Right Parotid Gland Benign Tumor, 1990  . Reconstruction of nose  1994    Nasal bridge reconstruction (Dr Judie Grieve, 1994) for following  Forklift accident on job. Surgery complicated by nerve damage resulting in difficulty raising right eyebrow   . Carpal tunnel release  2010    Carpel Tunnel Release of  left wrist  03/2009 (Dr Fredna Dow): Nerve   . Cataract extraction w/ intraocular lens implant  2010    Dr Charise Killian (ophth)  . Breast lumpectomy      Lumpectomy of benign Breast lumps bilaterally, Dr Bubba Camp  . Laminectomy      S/P L4-5 Laminectomy (1987) for decompression of spinal stenosis  . Colonoscopy w/ polypectomy  2006  . Parotidectomy  08/30/11    Radene Journey, MD (ENT) for recurrent right parotid pleomorphic adenoma by frozen section  . Carpal tunnel release      right wrist  . Esophagogastroduodenoscopy N/A 04/10/2016    Procedure: ESOPHAGOGASTRODUODENOSCOPY (EGD);  Surgeon: Irene Shipper, MD; argon plasm coagulation duod AVMs Location: [facility] ENDOSCOPY;  Service: Endoscopy;  Laterality: N/A;  . Enteroscopy N/A 04/18/2016    Procedure: ENTEROSCOPY;  Surgeon: Mauri Pole, MD;  Location: [facility] ENDOSCOPY;  Service: Endoscopy;  Laterality: N/A;  . Givens capsule study N/A 04/28/2016    Procedure: GIVENS CAPSULE STUDY;  Surgeon: Carol Ada, MD;  Location: [facility];  Service: Endoscopy;  Laterality: N/A;  . Small bowel enteroscopy  05/04/16  . Colonic embolization  04/22/16    [place] IR service  third order arteries pancreatoduodenal occluded by coil embolization x 2  . Other surgical history  04/27/16    Capsule endoscopy showed bleed in deep small bowel  . Colonic embolization  04/30/16    [facility] IR coil embolization of GDA & last poertion of pancreaticoduodenal branch of SMA   Family History  Problem Relation Age of Onset  . Coronary artery disease Mother   . Coronary artery disease Sister     open heart surgery  . Diabetes type II Sister   . Stroke Father 57  .  Hypertension Father   . Lupus Brother   . Heart disease Brother   . Heart attack Sister    Social History  Substance Use Topics  . Smoking status: Former Smoker    Quit date: 01/14/1984  . Smokeless tobacco: Former Systems developer  . Alcohol Use: No   OB History    Gravida Para Term Preterm AB TAB SAB Ectopic Multiple Living   8 8        7       Obstetric Comments   One daughter passed away one year ago All vaginal deliveries     Review of Systems  Gastrointestinal: Positive for diarrhea. Negative for vomiting and abdominal pain.  All other systems reviewed and are negative.   Allergies  Nitrofurantoin  Home Medications   Prior to Admission medications   Medication Sig Start Date  End Date Taking? Authorizing Provider  ferrous sulfate 325 (65 FE) MG tablet Take 1 tablet (325 mg total) by mouth 2 (two) times daily with a meal. 04/11/16  Yes Mercy Riding, MD  HYDROcodone-acetaminophen (NORCO) 10-325 MG tablet Take 1 tablet by mouth every 12 (twelve) hours as needed for moderate pain or severe pain. 06/11/16  Yes Leeanne Rio, MD  pregabalin (LYRICA) 50 MG capsule Take 1 capsule (50 mg total) by mouth 2 (two) times daily. 06/11/16  Yes Leeanne Rio, MD  cephALEXin (KEFLEX) 500 MG capsule Take 1 capsule (500 mg total) by mouth 4 (four) times daily. 06/29/16   Fransico Meadow, PA-C  magnesium hydroxide (MILK OF MAGNESIA) 400 MG/5ML suspension Take 30 mLs by mouth daily as needed for mild constipation. Reported on 05/09/2016    Historical Provider, MD  polyethylene glycol powder (GLYCOLAX/MIRALAX) powder Take 17 g by mouth daily as needed for mild constipation, moderate constipation or severe constipation. 02/14/16   Blane Ohara McDiarmid, MD   Meds Ordered and Administered this Visit  Medications - No data to display  BP 173/107 mmHg  Pulse 93  Temp(Src) 98 F (36.7 C) (Oral)  Resp 16  SpO2 95%  LMP 12/17/1962 No data found.   Physical Exam  Constitutional: She is oriented to  person, place, and time. She appears well-developed and well-nourished.  HENT:  Head: Normocephalic.  Right Ear: External ear normal.  Left Ear: External ear normal.  Nose: Nose normal.  Mouth/Throat: Oropharynx is clear and moist.  Eyes: Conjunctivae and EOM are normal. Pupils are equal, round, and reactive to light.  Neck: Normal range of motion.  Cardiovascular: Normal rate and regular rhythm.   Pulmonary/Chest: Effort normal.  Abdominal: She exhibits no distension.  Musculoskeletal: Normal range of motion.  Neurological: She is alert and oriented to person, place, and time.  Skin: Skin is warm.  Psychiatric: She has a normal mood and affect.  Nursing note and vitals reviewed.   ED Course  Procedures (including critical care time)  Labs Review Labs Reviewed  POCT URINALYSIS DIP (DEVICE) - Abnormal; Notable for the following:    Hgb urine dipstick TRACE (*)    Nitrite POSITIVE (*)    Leukocytes, UA LARGE (*)    All other components within normal limits  URINE CULTURE    Imaging Review No results found.   Visual Acuity Review  Right Eye Distance:   Left Eye Distance:   Bilateral Distance:    Right Eye Near:   Left Eye Near:    Bilateral Near:         MDM Pt reports she is on metamucil for constipation.  Pt reports it causes her to have diarrhea.  Pt has decreased to every other day.  No bloody or tarry stool.    1. UTI (lower urinary tract infection)    Meds ordered this encounter  Medications  . cephALEXin (KEFLEX) 500 MG capsule    Sig: Take 1 capsule (500 mg total) by mouth 4 (four) times daily.    Dispense:  40 capsule    Refill:  0    Order Specific Question:  Supervising Provider    Answer:  Sherlene Shams [id number]  An After Visit Summary was printed and given to the patient.    [place], PA-C 06/29/16 1746

## 2016-06-29 NOTE — Discharge Instructions (Signed)

## 2016-06-29 NOTE — ED Notes (Signed)
Pt c/o UTI sx onset yest associated w/dysuria, cloudy urine, chills and diarrhea A&O x4... NAD

## 2016-07-02 ENCOUNTER — Other Ambulatory Visit: Payer: Self-pay | Admitting: Student

## 2016-07-02 LAB — URINE CULTURE

## 2016-07-03 ENCOUNTER — Other Ambulatory Visit: Payer: Self-pay | Admitting: Internal Medicine

## 2016-07-03 ENCOUNTER — Encounter (HOSPITAL_COMMUNITY): Payer: Self-pay

## 2016-07-03 ENCOUNTER — Ambulatory Visit (HOSPITAL_COMMUNITY): Payer: Medicare Other

## 2016-07-03 DIAGNOSIS — R001 Bradycardia, unspecified: Secondary | ICD-10-CM

## 2016-07-03 MED ORDER — TECHNETIUM TC 99M TETROFOSMIN IV KIT
11.0000 | PACK | Freq: Once | INTRAVENOUS | Status: AC | PRN
  Administered 2016-07-03: 11 via INTRAVENOUS
  Filled 2016-07-03: qty 11

## 2016-07-04 NOTE — Progress Notes (Signed)
Message has been sent to NL scheduling pool to arrange appt for monitor @ [facility] and follow up with Dr. Debara Pickett

## 2016-07-04 NOTE — Progress Notes (Signed)
In reference to:  ----- Message from Pixie Casino, MD sent at 07/03/2016 1:31 PM EDT -----    Patient noted to have junctional bradycardia during stress test - will place 1 week monitor. Please schedule follow-up with me after - ok to double book (but not Monday 7/31)

## 2016-07-05 NOTE — Progress Notes (Signed)
Called patient's mobile number and spoke with daughter (DPR) She and patient would like further explanation on why she needs to wear heart monitor again.  Dr. Debara Pickett spoke with daughter

## 2016-07-05 NOTE — Progress Notes (Signed)
Deedie C Creed  Fidel Levy, RN           Called patient to make appt for monitor. She states she wore one before she went to the hospital and doesn't know why she needs to wear one again. She said she was told the monitor showed nothing wrong. She would like to discuss with someone.

## 2016-07-06 ENCOUNTER — Telehealth: Payer: Self-pay | Admitting: Internal Medicine

## 2016-07-06 NOTE — Telephone Encounter (Signed)
  Dr. Debara Pickett talked to daughter 07/05/16 Daughter will talk with patient about wearing monitor & call back   Fidel Levy, RN at 07/05/2016 11:45 AM     Status: Signed       Expand All Collapse All   Called patient's mobile number and spoke with daughter (DPR) She and patient would like further explanation on why she needs to wear heart monitor again.  Dr. Debara Pickett spoke with daughter            Fidel Levy, RN at 07/05/2016 11:37 AM     Status: Signed       Expand All Collapse All   Nicole Perez  Fidel Levy, RN           Called patient to make appt for monitor. She states she wore one before she went to the hospital and doesn't know why she needs to wear one again. She said she was told the monitor showed nothing wrong. She would like to discuss with someone.                 Fidel Levy, RN at 07/04/2016 7:34 AM     Status: Signed       Expand All Collapse All   In reference to:  ----- Message from Pixie Casino, MD sent at 07/03/2016 1:31 PM EDT -----    Patient noted to have junctional bradycardia during stress test - will place 1 week monitor. Please schedule follow-up with me after - ok to double book (but not Monday 7/31)               Fidel Levy, RN at 07/04/2016 7:34 AM     Status: Signed       Expand All Collapse All   Message has been sent to [facility] scheduling pool to arrange appt for monitor @ [facility] and follow up with Dr. Debara Pickett

## 2016-07-10 DIAGNOSIS — R0989 Other specified symptoms and signs involving the circulatory and respiratory systems: Secondary | ICD-10-CM

## 2016-07-11 ENCOUNTER — Other Ambulatory Visit: Payer: Self-pay | Admitting: *Deleted

## 2016-07-11 ENCOUNTER — Ambulatory Visit (INDEPENDENT_AMBULATORY_CARE_PROVIDER_SITE_OTHER): Payer: Medicare Other | Admitting: Cardiology

## 2016-07-11 DIAGNOSIS — M150 Primary generalized (osteo)arthritis: Secondary | ICD-10-CM

## 2016-07-11 DIAGNOSIS — M549 Dorsalgia, unspecified: Secondary | ICD-10-CM

## 2016-07-11 DIAGNOSIS — M47812 Spondylosis without myelopathy or radiculopathy, cervical region: Secondary | ICD-10-CM

## 2016-07-11 DIAGNOSIS — G629 Polyneuropathy, unspecified: Secondary | ICD-10-CM

## 2016-07-11 DIAGNOSIS — M48061 Spinal stenosis, lumbar region without neurogenic claudication: Secondary | ICD-10-CM

## 2016-07-11 DIAGNOSIS — G608 Other hereditary and idiopathic neuropathies: Secondary | ICD-10-CM

## 2016-07-11 DIAGNOSIS — M160 Bilateral primary osteoarthritis of hip: Secondary | ICD-10-CM

## 2016-07-11 DIAGNOSIS — M159 Polyosteoarthritis, unspecified: Secondary | ICD-10-CM

## 2016-07-11 DIAGNOSIS — M170 Bilateral primary osteoarthritis of knee: Secondary | ICD-10-CM

## 2016-07-11 DIAGNOSIS — M4804 Spinal stenosis, thoracic region: Secondary | ICD-10-CM

## 2016-07-11 DIAGNOSIS — G8929 Other chronic pain: Secondary | ICD-10-CM

## 2016-07-13 ENCOUNTER — Other Ambulatory Visit: Payer: Medicare Other

## 2016-07-13 DIAGNOSIS — M4804 Spinal stenosis, thoracic region: Secondary | ICD-10-CM

## 2016-07-13 DIAGNOSIS — M170 Bilateral primary osteoarthritis of knee: Secondary | ICD-10-CM

## 2016-07-13 DIAGNOSIS — M159 Polyosteoarthritis, unspecified: Secondary | ICD-10-CM

## 2016-07-13 DIAGNOSIS — M150 Primary generalized (osteo)arthritis: Secondary | ICD-10-CM

## 2016-07-13 DIAGNOSIS — M160 Bilateral primary osteoarthritis of hip: Secondary | ICD-10-CM

## 2016-07-13 DIAGNOSIS — G608 Other hereditary and idiopathic neuropathies: Secondary | ICD-10-CM

## 2016-07-13 DIAGNOSIS — M549 Dorsalgia, unspecified: Secondary | ICD-10-CM

## 2016-07-13 DIAGNOSIS — D500 Iron deficiency anemia secondary to blood loss (chronic): Secondary | ICD-10-CM

## 2016-07-13 DIAGNOSIS — M48061 Spinal stenosis, lumbar region without neurogenic claudication: Secondary | ICD-10-CM

## 2016-07-13 DIAGNOSIS — G629 Polyneuropathy, unspecified: Secondary | ICD-10-CM

## 2016-07-13 DIAGNOSIS — G8929 Other chronic pain: Secondary | ICD-10-CM

## 2016-07-13 DIAGNOSIS — M47812 Spondylosis without myelopathy or radiculopathy, cervical region: Secondary | ICD-10-CM

## 2016-07-13 LAB — CBC
HCT: 36.2 % (ref 35.0–45.0)
Hemoglobin: 11.9 g/dL (ref 11.7–15.5)
MCH: 27.2 pg (ref 27.0–33.0)
MCHC: 32.9 g/dL (ref 32.0–36.0)
MCV: 82.8 fL (ref 80.0–100.0)
MPV: 10.2 fL (ref 7.5–12.5)
PLATELETS: 230 10*3/uL (ref 140–400)
RBC: 4.37 MIL/uL (ref 3.80–5.10)
RDW: 14.4 % (ref 11.0–15.0)
WBC: 5.4 10*3/uL (ref 3.8–10.8)

## 2016-07-13 MED ORDER — HYDROCODONE-ACETAMINOPHEN 10-325 MG PO TABS: 1.0000 | ORAL_TABLET | Freq: Two times a day (BID) | ORAL | 0 refills | Status: DC | PRN

## 2016-07-13 NOTE — Progress Notes (Signed)
Preceptor today, covering for Dr. McDiarmid who is out on vacation.  I have provided a one month refill for Ms Segner chronic narcotics for her pain.

## 2016-07-16 ENCOUNTER — Encounter: Payer: Self-pay | Admitting: Family Medicine

## 2016-07-17 ENCOUNTER — Telehealth: Payer: Self-pay | Admitting: Family Medicine

## 2016-07-17 MED ORDER — HYDROCODONE-ACETAMINOPHEN 10-325 MG PO TABS: 1.0000 | ORAL_TABLET | Freq: Two times a day (BID) | ORAL | 0 refills | Status: DC | PRN

## 2016-07-17 NOTE — Telephone Encounter (Signed)
FYI Dr. Wendy Poet

## 2016-07-17 NOTE — Telephone Encounter (Signed)
Pt called to let us know she already had her Rx filled.Pt said another doctor gave her the Rx when she came in for labs b/c her pcp was out of town. Pt said to disregard the Rx that was just called in for her about her pain medicine. Thanks! ep

## 2016-07-17 NOTE — Telephone Encounter (Signed)
Informed patient her pain medication has been written and is ready for pick up.

## 2016-07-17 NOTE — Telephone Encounter (Signed)
-----   Message from Blane Ohara McDiarmid, MD sent at 07/17/2016  8:34 AM EDT ----- Regarding: Call to pick up Rx Please let patient know her prescription(s) are available for pick up from the [facility] front desk.

## 2016-07-18 ENCOUNTER — Encounter: Payer: Self-pay | Admitting: Cardiology

## 2016-07-18 NOTE — Progress Notes (Signed)
Patient no-showed for her 07/11/16 appointment w/B. Rosita Fire, [place] Stress test that was ordered was incomplete and has no been r/s Patient has not called back to arrange monitor.

## 2016-07-18 NOTE — Telephone Encounter (Signed)
Patient no-showed for her 07/11/16 appointment w/B. Rosita Fire, [place] Stress test that was ordered was incomplete and has not been r/s Patient has not called back to arrange monitor.

## 2016-07-23 ENCOUNTER — Other Ambulatory Visit: Payer: Self-pay | Admitting: Pharmacist

## 2016-07-23 NOTE — Patient Outreach (Signed)
Outreach call to [place] regarding her request for follow up from the [facility] Medication Adherence Campaign. Left a HIPAA compliant message on the patient's voicemail.  Harlow Asa, PharmD Clinical Pharmacist [facility] [phone]

## 2016-07-31 ENCOUNTER — Ambulatory Visit (INDEPENDENT_AMBULATORY_CARE_PROVIDER_SITE_OTHER): Payer: Medicare Other

## 2016-07-31 DIAGNOSIS — R001 Bradycardia, unspecified: Secondary | ICD-10-CM | POA: Diagnosis not present

## 2016-08-04 ENCOUNTER — Other Ambulatory Visit: Payer: Self-pay | Admitting: Family Medicine

## 2016-08-07 ENCOUNTER — Telehealth: Payer: Self-pay | Admitting: Internal Medicine

## 2016-08-07 NOTE — Telephone Encounter (Signed)
Returned call to [employer]. 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

## 2016-08-07 NOTE — Telephone Encounter (Signed)
New message  Sehrish from [employer] call requesting to get DX on pt monitor. Please call back to discuss

## 2016-08-09 ENCOUNTER — Other Ambulatory Visit: Payer: Self-pay | Admitting: Family Medicine

## 2016-08-09 ENCOUNTER — Encounter: Payer: Self-pay | Admitting: Family Medicine

## 2016-08-09 ENCOUNTER — Ambulatory Visit (INDEPENDENT_AMBULATORY_CARE_PROVIDER_SITE_OTHER): Payer: Medicare Other | Admitting: Family Medicine

## 2016-08-09 VITALS — BP 149/84 | HR 99 | Temp 98.2°F | Wt 142.0 lb

## 2016-08-09 DIAGNOSIS — M549 Dorsalgia, unspecified: Secondary | ICD-10-CM | POA: Diagnosis not present

## 2016-08-09 DIAGNOSIS — K6381 Dieulafoy lesion of intestine: Secondary | ICD-10-CM

## 2016-08-09 DIAGNOSIS — G608 Other hereditary and idiopathic neuropathies: Secondary | ICD-10-CM

## 2016-08-09 DIAGNOSIS — I10 Essential (primary) hypertension: Secondary | ICD-10-CM

## 2016-08-09 DIAGNOSIS — M150 Primary generalized (osteo)arthritis: Secondary | ICD-10-CM

## 2016-08-09 DIAGNOSIS — G629 Polyneuropathy, unspecified: Secondary | ICD-10-CM

## 2016-08-09 DIAGNOSIS — E7800 Pure hypercholesterolemia, unspecified: Secondary | ICD-10-CM

## 2016-08-09 DIAGNOSIS — D509 Iron deficiency anemia, unspecified: Secondary | ICD-10-CM | POA: Diagnosis not present

## 2016-08-09 DIAGNOSIS — D62 Acute posthemorrhagic anemia: Secondary | ICD-10-CM | POA: Diagnosis not present

## 2016-08-09 DIAGNOSIS — R413 Other amnesia: Secondary | ICD-10-CM

## 2016-08-09 DIAGNOSIS — Z7189 Other specified counseling: Secondary | ICD-10-CM

## 2016-08-09 DIAGNOSIS — M159 Polyosteoarthritis, unspecified: Secondary | ICD-10-CM

## 2016-08-09 DIAGNOSIS — G8929 Other chronic pain: Secondary | ICD-10-CM

## 2016-08-09 LAB — CBC
HEMATOCRIT: 36.3 % (ref 35.0–45.0)
HEMOGLOBIN: 12 g/dL (ref 11.7–15.5)
MCH: 26.8 pg — AB (ref 27.0–33.0)
MCHC: 33.1 g/dL (ref 32.0–36.0)
MCV: 81.2 fL (ref 80.0–100.0)
MPV: 10 fL (ref 7.5–12.5)
Platelets: 185 10*3/uL (ref 140–400)
RBC: 4.47 MIL/uL (ref 3.80–5.10)
RDW: 15.1 % — ABNORMAL HIGH (ref 11.0–15.0)
WBC: 5.5 10*3/uL (ref 3.8–10.8)

## 2016-08-09 LAB — FERRITIN: FERRITIN: 143 ng/mL (ref 20–288)

## 2016-08-09 MED ORDER — HYDROCODONE-ACETAMINOPHEN 10-325 MG PO TABS: 1.0000 | ORAL_TABLET | Freq: Two times a day (BID) | ORAL | 0 refills | Status: DC | PRN

## 2016-08-09 MED ORDER — LISINOPRIL 5 MG PO TABS: 5.0000 mg | ORAL_TABLET | Freq: Every day | ORAL | 3 refills | Status: DC

## 2016-08-09 NOTE — Patient Instructions (Signed)
Dr Barnard Sharps will call you if your tests are not good. Otherwise he will send you a letter.  If you sign up for MyChart online, you will be able to see your test results once Dr Elika Godar has reviewed them.  If you do not hear from us with in 2 weeks please call our office   

## 2016-08-09 NOTE — Assessment & Plan Note (Signed)
Established problem Controlled Continue Lyrica

## 2016-08-09 NOTE — Assessment & Plan Note (Signed)
Established problem. Stable. Continue current therapy of iron supplement. If stable hgb and ferritin cw adequate iron stores,  will recommend reduce iron tablet to one tab daily

## 2016-08-09 NOTE — Assessment & Plan Note (Signed)
Established problem Controlled Continue Norco 10/325 bid prn Opiate therapy helps patient continue ADLs and iADLs without significant adverse effects or abberrant behaviors.

## 2016-08-09 NOTE — Assessment & Plan Note (Signed)
Established problem Need to restart statin next ov

## 2016-08-09 NOTE — Progress Notes (Signed)
   Subjective:    Patient ID: Nicole Perez, female    DOB: [birth year], 82 y.o.   MRN: [id number] MERCURY SCHULMEISTER is accompanied by daughter Sources of clinical information for visit is/are patient, relative(s), past medical records and Careeverywhere. Nursing assessment for this office visit was reviewed with the patient for accuracy and revision.   HPI  Osteoarthritis, primary, multiple joints - longstanding - able to complete adls with use of bid Norco - constipation from norco controlled with Miralax - No falls. 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  Occasional brief pparaumbilical pain.   Painful peripheral neuropathy - longstanding secondary to serious spinal injuries - improved with Lyrica.less pain - no edema in legs  Essential Hypertension - longstanding px - tolerating lisinopril.  No chronic cough, no syncope, no chest pain   No smoking  Review of Systems    No fever (+) occasional headache (mild, frontal) Objective:   Physical Exam VS reviewed GEN: Alert, Cooperative, Groomed, NAD HEENT: PERRL; EAC bilaterally not occluded, TM's translucent with normal LM, (+) LR;                No cervical LAN, No thyromegaly, No palpable masses COR: RRR, No M/G/R, No JVD, Normal PMI size and location LUNGS: BCTA, No Acc mm use, speaking in full sentences ABDOMEN: (+)BS, soft, NT, ND, No HSM, No palpable masses Gait: Normal speed using rolling walker, No significant path deviation, Step through +,  Psych: Normal affect/thought/speech/language        Assessment & Plan:

## 2016-08-09 NOTE — Assessment & Plan Note (Signed)
Adequate blood pressure control.  No evidence of new end organ damage.  Tolerating medication without significant adverse effects.  Plan to continue current blood pressure regiment.   

## 2016-08-09 NOTE — Assessment & Plan Note (Signed)
Established problem. Stable. Need MoCA soon

## 2016-08-10 NOTE — Telephone Encounter (Signed)
Informed Nicole Perez of her Hgb 12.0 g/dL with ferritin of 140. Recommended she decrease her iron tablet to one tablet a day.  She agreed to this lower daily dose of iron.

## 2016-08-22 ENCOUNTER — Other Ambulatory Visit: Payer: Self-pay | Admitting: Family Medicine

## 2016-08-22 NOTE — Telephone Encounter (Signed)
Pt states it really burns when she urinates. Pt would like her doctor to call in an antibiotic. Please advise. Thanks! ep

## 2016-08-22 NOTE — Telephone Encounter (Signed)
Pt is calling back to check the status of her request for antibiotics . jw

## 2016-08-23 MED ORDER — CEPHALEXIN 500 MG PO CAPS: 500.0000 mg | ORAL_CAPSULE | Freq: Four times a day (QID) | ORAL | 0 refills | Status: AC

## 2016-08-23 NOTE — Telephone Encounter (Signed)
Please let patient know that antibiotic, Keflex, was sent to her pharmacy. If she worsens, or if she is not improved within three days, she should come into the [facility] for an assessment.

## 2016-08-23 NOTE — Telephone Encounter (Signed)
Pt informed of rx at her pharmacy.

## 2016-08-30 DIAGNOSIS — R339 Retention of urine, unspecified: Secondary | ICD-10-CM | POA: Diagnosis not present

## 2016-09-11 ENCOUNTER — Encounter (HOSPITAL_COMMUNITY): Payer: Self-pay

## 2016-09-11 NOTE — Progress Notes (Signed)
Patient had a nuclear stress test scheduled for 07/03/16.  The test was cancelled half way thru the test 2nd to EKG per DOD, Lendell Caprice, MD.  The patient's cardiologist, C. Hilty, MD, was notifiedand stated that he would see the patient for an office visit and would then have her stress test rescheduled.  As of today, 09/11/16, this has not been completed, therefore her test as a whole is being cancelled and she will not be charged.  If nuclear stress test is desired in the future, the entire test will need to be scheduled and completed.       Nicole Perez, CNMT, RT-N

## 2016-09-13 ENCOUNTER — Ambulatory Visit (INDEPENDENT_AMBULATORY_CARE_PROVIDER_SITE_OTHER): Payer: Medicare Other | Admitting: Family Medicine

## 2016-09-13 ENCOUNTER — Encounter: Payer: Self-pay | Admitting: Family Medicine

## 2016-09-13 VITALS — BP 155/80 | HR 65 | Temp 97.7°F | Wt 145.0 lb

## 2016-09-13 DIAGNOSIS — K31819 Angiodysplasia of stomach and duodenum without bleeding: Secondary | ICD-10-CM | POA: Diagnosis not present

## 2016-09-13 DIAGNOSIS — D509 Iron deficiency anemia, unspecified: Secondary | ICD-10-CM

## 2016-09-13 DIAGNOSIS — I10 Essential (primary) hypertension: Secondary | ICD-10-CM | POA: Diagnosis not present

## 2016-09-13 DIAGNOSIS — D500 Iron deficiency anemia secondary to blood loss (chronic): Secondary | ICD-10-CM

## 2016-09-13 DIAGNOSIS — Z23 Encounter for immunization: Secondary | ICD-10-CM | POA: Diagnosis not present

## 2016-09-13 LAB — CBC
HCT: 35 % (ref 35.0–45.0)
Hemoglobin: 11.6 g/dL — ABNORMAL LOW (ref 11.7–15.5)
MCH: 27.5 pg (ref 27.0–33.0)
MCHC: 33.1 g/dL (ref 32.0–36.0)
MCV: 82.9 fL (ref 80.0–100.0)
MPV: 10.5 fL (ref 7.5–12.5)
PLATELETS: 195 10*3/uL (ref 140–400)
RBC: 4.22 MIL/uL (ref 3.80–5.10)
RDW: 16.9 % — AB (ref 11.0–15.0)
WBC: 4.1 10*3/uL (ref 3.8–10.8)

## 2016-09-13 LAB — FERRITIN: Ferritin: 154 ng/mL (ref 20–288)

## 2016-09-13 MED ORDER — DILTIAZEM HCL ER COATED BEADS 180 MG PO CP24: 180.0000 mg | ORAL_CAPSULE | Freq: Every day | ORAL | 1 refills | Status: DC

## 2016-09-13 NOTE — Patient Instructions (Signed)
Stop the Lisinopril  Start The Diltiazem 180 mg pill every morning  Dr McDiarmid will call you if your tests are not good. Otherwise he will send you a letter.  If you sign up for MyChart online, you will be able to see your test results once Dr McDiarmid has reviewed them.  If you do not hear from [place] with in 2 weeks please call our office

## 2016-09-14 DIAGNOSIS — D500 Iron deficiency anemia secondary to blood loss (chronic): Secondary | ICD-10-CM | POA: Insufficient documentation

## 2016-09-14 HISTORY — DX: Iron deficiency anemia secondary to blood loss (chronic): D50.0

## 2016-09-14 NOTE — Progress Notes (Signed)
LM for patient with message from MD.  Ambri Miltner,CMA ° °

## 2016-09-14 NOTE — Assessment & Plan Note (Signed)
Established problem. Stable. Continue current therapy iron tablet one daily Recheck Hgb next month.

## 2016-09-14 NOTE — Progress Notes (Signed)
   Subjective:    Patient ID: Nicole Perez, female    DOB: [birth year], 82 y.o.   MRN: [id number] Nicole Perez is accompanied by daughter Sources of clinical information for visit is/are patient, relative(s), past medical records and Careeverywhere. Nursing assessment for this office visit was reviewed with the patient for accuracy and revision.   HPI  CHRONIC HYPERTENSION  Disease Monitoring  Blood pressure range: running in 160-170 SBP and 70-80 DBP  Chest pain: no   Dyspnea: no   Claudication: no   Medication compliance: yes - Lisinopril was started during recent hopitalizations for GI bleeding and her Diltiazem which she had been on for years had been stopped bc of hypotension  Medication Side Effects  Lightheadedness: no   Urinary frequency: no   Edema: no   Headache: YES on lisinopril that stopped when she stopped the lisinopril and restarted wihen she restarted the Lisinopril per pt  Osteoarthritis, primary, multiple joints - longstanding - able to complete adls with use of bid Norco - constipation from norco controlled with Miralax - No falls. 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  Occasional brief pparaumbilical pain.   Painful peripheral neuropathy - longstanding secondary to serious spinal injuries - improved with Lyrica.less pain - no edema in legs  Essential Hypertension - longstanding px - tolerating lisinopril.  No chronic cough, no syncope, no chest pain   No smoking  Review of Systems    No fever (+) occasional headache (mild, frontal) Objective:   Physical Exam VS reviewed GEN: Alert, Cooperative, Groomed, NAD HEENT: PERRL; EAC  bilaterally not occluded, TM's translucent with normal LM, (+) LR;                No cervical LAN, No thyromegaly, No palpable masses COR: RRR, No M/G/R, No JVD, Normal PMI size and location LUNGS: BCTA, No Acc mm use, speaking in full sentences ABDOMEN: (+)BS, soft, NT, ND, No HSM, No palpable masses Gait: Normal speed using rolling walker, No significant path deviation, Step through +,  Psych: Normal affect/thought/speech/language        Assessment & Plan:

## 2016-09-14 NOTE — Assessment & Plan Note (Signed)
Established problem. Pt experienced headache on challenge-dechallenge-rechallenge of Lisinopril. Stop Lisinopril Restart Diltiazem 24hrtab 180 mg daily RTC 2-4 wks to recheck tolerance and effect of med chaqnge

## 2016-09-14 NOTE — Assessment & Plan Note (Signed)
Established problem. Lab Results  Component Value Date   HGB 11.6 (L) 09/13/2016   Lab Results  Component Value Date   FERRITIN 154 09/13/2016   Stable  Continue daily iron tablet Recheck next month.   Continue current therapy

## 2016-10-12 ENCOUNTER — Other Ambulatory Visit: Payer: Self-pay | Admitting: General Practice

## 2016-10-12 DIAGNOSIS — G629 Polyneuropathy, unspecified: Secondary | ICD-10-CM

## 2016-10-12 DIAGNOSIS — G8929 Other chronic pain: Secondary | ICD-10-CM

## 2016-10-12 DIAGNOSIS — M159 Polyosteoarthritis, unspecified: Secondary | ICD-10-CM

## 2016-10-12 DIAGNOSIS — M549 Dorsalgia, unspecified: Secondary | ICD-10-CM

## 2016-10-12 DIAGNOSIS — G608 Other hereditary and idiopathic neuropathies: Secondary | ICD-10-CM

## 2016-10-12 DIAGNOSIS — M150 Primary generalized (osteo)arthritis: Principal | ICD-10-CM

## 2016-10-12 MED ORDER — HYDROCODONE-ACETAMINOPHEN 10-325 MG PO TABS: 1.0000 | ORAL_TABLET | Freq: Two times a day (BID) | ORAL | 0 refills | Status: DC | PRN

## 2016-10-12 NOTE — Telephone Encounter (Signed)
2nd request. Please advise. Thanks! ep °

## 2016-10-12 NOTE — Telephone Encounter (Signed)
Pt's daughter call to request medication refill for pt.. States pt is out...Marland KitchenMarland KitchenPlease assist  Hydrocodone-acetaminophen(NORCO) 10-325

## 2016-10-12 NOTE — Telephone Encounter (Signed)
Please let patient's dgt know her mother's prescription(s) are available for pick up from the [facility] front desk.

## 2016-10-15 NOTE — Telephone Encounter (Signed)
Pt informed of rx ready for pick up, up front.

## 2016-11-01 ENCOUNTER — Other Ambulatory Visit: Payer: Self-pay | Admitting: Family Medicine

## 2016-11-01 DIAGNOSIS — D500 Iron deficiency anemia secondary to blood loss (chronic): Secondary | ICD-10-CM

## 2016-11-02 ENCOUNTER — Other Ambulatory Visit: Payer: Medicare Other

## 2016-11-02 DIAGNOSIS — D500 Iron deficiency anemia secondary to blood loss (chronic): Secondary | ICD-10-CM

## 2016-11-02 LAB — CBC
HCT: 36.6 % (ref 35.0–45.0)
Hemoglobin: 11.9 g/dL (ref 11.7–15.5)
MCH: 29.1 pg (ref 27.0–33.0)
MCHC: 32.5 g/dL (ref 32.0–36.0)
MCV: 89.5 fL (ref 80.0–100.0)
MPV: 10.9 fL (ref 7.5–12.5)
PLATELETS: 208 10*3/uL (ref 140–400)
RBC: 4.09 MIL/uL (ref 3.80–5.10)
RDW: 14.7 % (ref 11.0–15.0)
WBC: 3.7 10*3/uL — ABNORMAL LOW (ref 3.8–10.8)

## 2016-11-05 ENCOUNTER — Telehealth: Payer: Self-pay | Admitting: Family Medicine

## 2016-11-05 NOTE — Telephone Encounter (Signed)
Informed Nicole Perez of her normal hemoglobin level

## 2016-11-15 ENCOUNTER — Encounter: Payer: Self-pay | Admitting: Family Medicine

## 2016-11-15 ENCOUNTER — Ambulatory Visit (INDEPENDENT_AMBULATORY_CARE_PROVIDER_SITE_OTHER): Payer: Medicare Other | Admitting: Family Medicine

## 2016-11-15 VITALS — BP 146/121 | HR 82 | Temp 97.9°F | Ht 59.0 in | Wt 147.0 lb

## 2016-11-15 DIAGNOSIS — K644 Residual hemorrhoidal skin tags: Secondary | ICD-10-CM

## 2016-11-15 DIAGNOSIS — D509 Iron deficiency anemia, unspecified: Secondary | ICD-10-CM

## 2016-11-15 DIAGNOSIS — I10 Essential (primary) hypertension: Secondary | ICD-10-CM | POA: Diagnosis not present

## 2016-11-15 DIAGNOSIS — R300 Dysuria: Secondary | ICD-10-CM

## 2016-11-15 DIAGNOSIS — N390 Urinary tract infection, site not specified: Secondary | ICD-10-CM

## 2016-11-15 DIAGNOSIS — D500 Iron deficiency anemia secondary to blood loss (chronic): Secondary | ICD-10-CM

## 2016-11-15 DIAGNOSIS — K641 Second degree hemorrhoids: Secondary | ICD-10-CM

## 2016-11-15 DIAGNOSIS — K648 Other hemorrhoids: Secondary | ICD-10-CM

## 2016-11-15 LAB — CBC
HCT: 35.4 % (ref 35.0–45.0)
Hemoglobin: 11.8 g/dL (ref 11.7–15.5)
MCH: 28.9 pg (ref 27.0–33.0)
MCHC: 33.3 g/dL (ref 32.0–36.0)
MCV: 86.8 fL (ref 80.0–100.0)
MPV: 11.5 fL (ref 7.5–12.5)
PLATELETS: 208 10*3/uL (ref 140–400)
RBC: 4.08 MIL/uL (ref 3.80–5.10)
RDW: 14.1 % (ref 11.0–15.0)
WBC: 4.6 10*3/uL (ref 3.8–10.8)

## 2016-11-15 LAB — POCT URINALYSIS DIPSTICK
Bilirubin, UA: NEGATIVE
Glucose, UA: NEGATIVE
Ketones, UA: NEGATIVE
NITRITE UA: POSITIVE
PROTEIN UA: NEGATIVE
Spec Grav, UA: 1.015
UROBILINOGEN UA: 0.2
pH, UA: 5.5

## 2016-11-15 LAB — POCT UA - MICROSCOPIC ONLY

## 2016-11-15 MED ORDER — CEFUROXIME AXETIL 250 MG PO TABS: 250.0000 mg | ORAL_TABLET | Freq: Two times a day (BID) | ORAL | 0 refills | Status: AC

## 2016-11-15 MED ORDER — DILTIAZEM HCL ER COATED BEADS 240 MG PO CP24: 180.0000 mg | ORAL_CAPSULE | Freq: Every day | ORAL | 99 refills | Status: DC

## 2016-11-15 MED ORDER — HYDROCORTISONE 2.5 % RE CREA: TOPICAL_CREAM | Freq: Two times a day (BID) | RECTAL | Status: AC

## 2016-11-15 NOTE — Progress Notes (Signed)
iltiazim

## 2016-11-15 NOTE — Patient Instructions (Addendum)
I believe you are having a urinary tract infection. Start Ceftin 250 mg twice a day for 7 days  You have an internal hemorrhoid and an external hemorrhoid.  You do not have to have surgery for the hemorrhoid as long as it pulls back up into your rectum after you go to bathroom.  Use the Anusol cream twice day for next 14 days to decrease pain from hemorrhoids.   Keep your stool soft so the hemorrhoids do not get bigger when you go to bathroom.   Your blood pressure is not under control, increase your Diltiazem to 240 mg a day.  In the past you needed three blood pressure medications to get your blood pressure under control.  You were taking HCTZ and lisinopril.

## 2016-11-16 ENCOUNTER — Telehealth: Payer: Self-pay | Admitting: Family Medicine

## 2016-11-16 DIAGNOSIS — K648 Other hemorrhoids: Secondary | ICD-10-CM

## 2016-11-16 HISTORY — DX: Other hemorrhoids: K64.8

## 2016-11-16 NOTE — Assessment & Plan Note (Signed)
Established problem worsened. Increase Diltiazem to 240 mg daily May need to restart thiazide and lisinopril which pt was on in April 2017 before hospitalizations for GI bleeds

## 2016-11-16 NOTE — Assessment & Plan Note (Signed)
New problem Continue Miralax Anusol HC started.

## 2016-11-16 NOTE — Progress Notes (Signed)
   Subjective:    Patient ID: Nicole Perez, female    DOB: 1933/07/09, 82 y.o.   MRN: [id number] CLYDE KOTTLER is accompanied by daughter Sources of clinical information for visit is/are patient, relative(s) and past medical records. Nursing assessment for this office visit was reviewed with the patient for accuracy and revision.   HPI CHRONIC HYPERTENSION Longstanding problem - PMH: was on Amlodipine/Lisinpril and HCTZ which was stopped in April during hospitalizations for GI bleeding Disease Monitoring  Blood pressure range:  Not checking at home  Chest pain: no   Dyspnea: no   Claudication: no   Medication compliance: yes, diltazem 180  mg daily  Medication Side Effects  Lightheadedness: no   Urinary frequency: yes   Edema: no   Preventitive Healthcare:  Exercise: no   Diet Pattern: low salt    Rectal Hemorrhoids - Onset month ago - Mass descends outside anus with defecation then spontaneously retracts within stop of defecation - Pain at anus - No bleeding from rectum, no melena - soft stools from use of Miralax  Dysuria Urinary Tract Infection: Patient complains of dysuria, frequency and pain suprapubic She has had symptoms for 4 days. Patient also complains of back pain and chills.  Patient denies fever and -. Patient does have a history of recurrent UTI. Patient performs self caths for neurogenic bladder.  Patient does not have a history of pyelonephritis.    SH: smoking history reviewed Review of Systems    See HPI Objective:   Physical Exam VS reviewed GEN: Alert, Cooperative, Groomed, NAD COR: RRR, No M/G/R, No JVD, Normal PMI size and location LUNGS: BCTA, No Acc mm use, speaking in full sentences ABDOMEN: (+)BS, soft, NT, ND, No HSM, No palpable masses GU: Normal Rectal tone, no palpable masses, external bluish anal bulge located at 5 O'clock postiion slightly tender.  Anoscopy: normal anorectal mucosa, no internal hemorrhoid EXT: No peripheral leg  edema.  SKIN: No lesion nor rashes of face/trunk/extremities Neuro: Oriented to person, place, and time;  Gait: slow gait speed with walker, No significant path deviation, Step through negative  Psych: Normal affect/thought/speech/language        Assessment & Plan:

## 2016-11-16 NOTE — Assessment & Plan Note (Signed)
New problem Patient's description c/w Grade II internal hemorrhoid Recommend stool softener Reevaluate if patient must manually reduce the prolapsed hemorrhoid

## 2016-11-16 NOTE — Assessment & Plan Note (Addendum)
New problem Urinalysis    Component Value Date/Time   COLORURINE YELLOW 04/17/2016 0239   APPEARANCEUR CLEAR 04/17/2016 0239   LABSPEC 1.010 06/29/2016 1652   PHURINE 6.5 06/29/2016 1652   GLUCOSEU NEGATIVE 06/29/2016 1652   HGBUR TRACE (A) 06/29/2016 1652   HGBUR trace-intact 01/01/2011 1337   BILIRUBINUR NEG 11/15/2016 1225   KETONESUR NEGATIVE 06/29/2016 1652   PROTEINUR NEG 11/15/2016 1225   PROTEINUR NEGATIVE 06/29/2016 1652   UROBILINOGEN 0.2 11/15/2016 1225   UROBILINOGEN 0.2 06/29/2016 1652   NITRITE POSITIVE 11/15/2016 1225   NITRITE POSITIVE (A) 06/29/2016 1652   LEUKOCYTESUR small (1+) (A) 11/15/2016 1225  Patient self-cath's. Urine obtained in office by pt self cathing her bladder. Recent hx of Klebsiella  UTI and Hx of E coli UTI resistant to Cipro Urine culture pending - Narrow abx based on results.  Start Ceftin 250 mg BID for 7 days.

## 2016-11-16 NOTE — Assessment & Plan Note (Signed)
Monitoring CBC today. If stable Hgb, reduce Iron tablet to one tablet  QOD from one daily

## 2016-11-16 NOTE — Telephone Encounter (Signed)
Will forward to MD. Emmory Solivan,CMA  

## 2016-11-16 NOTE — Telephone Encounter (Signed)
Pt states she is returning a phone call about lab results. Please advise. Thanks! ep

## 2016-11-18 LAB — URINE CULTURE

## 2016-11-19 ENCOUNTER — Telehealth: Payer: Self-pay | Admitting: Family Medicine

## 2016-11-19 NOTE — Telephone Encounter (Signed)
I left message with Nicole Perez' answering machine Message states that her urine culture grew E. Coli and that it was susceptible to the Ceftin she is currently prescribed.  Asked her to complete the cvourse of Ceftin.  Also, left message that her Hemoblogin is in the normal range.  Advised her to decreazse her iron to one tablet every other day.  Will recheck Hgb at next ov.

## 2016-11-29 ENCOUNTER — Telehealth: Payer: Self-pay | Admitting: *Deleted

## 2016-11-29 DIAGNOSIS — N3000 Acute cystitis without hematuria: Secondary | ICD-10-CM

## 2016-11-29 MED ORDER — AMOXICILLIN 500 MG PO CAPS: 500.0000 mg | ORAL_CAPSULE | Freq: Three times a day (TID) | ORAL | 0 refills | Status: DC

## 2016-11-29 NOTE — Telephone Encounter (Signed)
Patient is aware that script has been sent in. [facility]

## 2016-11-29 NOTE — Telephone Encounter (Signed)
Pt wants a antibiotic refill for her UTI, it hasn't cleared up. Please advise. Vane Yapp Kennon Holter, CMA

## 2016-11-29 NOTE — Telephone Encounter (Signed)
Called in Amoxicillin 500 mg TID for 10 days for possible UTI. Prior culture E. Coli sensitive to Amp  Please notify that antibiotic sent into her pharmacy. If symptoms do not improve or worsen, patient will need to be seen in [facility] for SDA.

## 2016-12-04 DIAGNOSIS — H26493 Other secondary cataract, bilateral: Secondary | ICD-10-CM | POA: Diagnosis not present

## 2016-12-13 ENCOUNTER — Ambulatory Visit (INDEPENDENT_AMBULATORY_CARE_PROVIDER_SITE_OTHER): Payer: Medicare Other | Admitting: Family Medicine

## 2016-12-13 ENCOUNTER — Encounter: Payer: Self-pay | Admitting: Family Medicine

## 2016-12-13 VITALS — BP 150/83 | HR 73 | Temp 98.0°F | Ht 59.0 in | Wt 150.0 lb

## 2016-12-13 DIAGNOSIS — R300 Dysuria: Secondary | ICD-10-CM | POA: Diagnosis not present

## 2016-12-13 DIAGNOSIS — N390 Urinary tract infection, site not specified: Secondary | ICD-10-CM | POA: Diagnosis not present

## 2016-12-13 DIAGNOSIS — N3000 Acute cystitis without hematuria: Secondary | ICD-10-CM | POA: Diagnosis not present

## 2016-12-13 LAB — POCT URINALYSIS DIPSTICK
Bilirubin, UA: NEGATIVE
Glucose, UA: NEGATIVE
KETONES UA: NEGATIVE
Nitrite, UA: POSITIVE
PH UA: 5.5
PROTEIN UA: NEGATIVE
SPEC GRAV UA: 1.015
UROBILINOGEN UA: 0.2

## 2016-12-13 LAB — POCT UA - MICROSCOPIC ONLY

## 2016-12-13 MED ORDER — ESTROGENS, CONJUGATED 0.625 MG/GM VA CREA: 1.0000 | TOPICAL_CREAM | Freq: Every day | VAGINAL | 12 refills | Status: DC

## 2016-12-13 MED ORDER — AMOXICILLIN-POT CLAVULANATE 500-125 MG PO TABS: 500.0000 mg | ORAL_TABLET | Freq: Two times a day (BID) | ORAL | 0 refills | Status: AC

## 2016-12-13 NOTE — Progress Notes (Signed)
    Subjective:  Nicole Perez is a 82 y.o. female who presents to the [facility] today with a chief complaint of dysuria.   HPI: Has h/o neurogenic bladder. Seen for UTI on 11/15/16 and completed ceftin 7 day course with some relief but not complete resolution so was prescribed 10 day course of amoxicillin 11/19/16. States due to holidays, did not complete course of amoxicillin, only took 6 days and although had some minimal improvement feels like is continuing to has dysuria and suprapubic pain. Also endorses urinary urgency and frequency, usually self caths 6x per day but has been feeling need to self cath and additional 2-3 times per day. As a result has some pain around urethra "feels raw" that has been getting worse. Was tried on topical lidocaine in March 2017 that felt was ineffective.  Denies fever/chills, n/v, diarrhea, hematuria.  ROS: Per HPI  Objective:  Physical Exam: BP (!) 150/83 (BP Location: Left Arm, Patient Position: Sitting, Cuff Size: Normal)   Pulse 73   Temp 98 F (36.7 C) (Oral)   Ht 4\' 11"  (1.499 m)   Wt 150 lb (68 kg)   LMP 12/17/1962   SpO2 99%   BMI 30.30 kg/m   Gen: NAD, resting comfortably CV: RRR with no murmurs appreciated Pulm: NWOB, CTAB with no crackles, wheezes, or rhonchi GI: Normal bowel sounds present. Soft, Nondistended. Mildly tender to palpation over suprapubic area. No CVA tenderness GU: normal female. Area around urethra has no skin breakdown, bleeding or redness.  Results for orders placed or performed in visit on 12/13/16 (from the past 72 hour(s))  POCT urinalysis dipstick     Status: Abnormal   Collection Time: 12/13/16 11:00 AM  Result Value Ref Range   Color, UA YELLOW    Clarity, UA SLIGHTLY CLOUDY    Glucose, UA NEG    Bilirubin, UA NEG    Ketones, UA NEG    Spec Grav, UA 1.015    Blood, UA SMALL    pH, UA 5.5    Protein, UA NEG    Urobilinogen, UA 0.2    Nitrite, UA POSITIVE    Leukocytes, UA Trace (A) Negative  POCT UA -  Microscopic Only     Status: Abnormal   Collection Time: 12/13/16 11:00 AM  Result Value Ref Range   WBC, Ur, HPF, POC 10-20    RBC, urine, microscopic 0-3    Bacteria, U Microscopic 3+ RODS    Epithelial cells, urine per micros 1-5      Assessment/Plan:  Complicated UTI (urinary tract infection) H/o neurogenic bladder with self cath at home. Has had recurrent UTI vs failed ABX (ceftin and amoxicillin) treatment since 11/15/16. Urine culture at that time showed Ecoli. On UA today has leuks, pos nitrites with 3+ rods so will treat with 10 day course of Augmentin. Urine culture sent today.  For urethral discomfort, advised patient to try estrogen cream mixed with OTC hydrocortisone.   Bufford Lope, DO PGY-1, [facility] 12/13/2016 10:59 AM

## 2016-12-13 NOTE — Assessment & Plan Note (Signed)
H/o neurogenic bladder with self cath at home. Has had recurrent UTI vs failed ABX (ceftin and amoxicillin) treatment since 11/15/16. Urine culture at that time showed Ecoli. On UA today has leuks, pos nitrites with 3+ rods so will treat with 10 day course of Augmentin. Urine culture sent today.  For urethral discomfort, advised patient to try estrogen cream mixed with OTC hydrocortisone.

## 2016-12-13 NOTE — Patient Instructions (Signed)
It was good to meet you today  For your UTI,  - Please take augmentin antibiotic twice a day for a total of 10 days, this is really important to finish the entire course. - For your urethral discomfort, I have sent in some estrogen cream. Please mix it half and half with over the counter hydrocortisone cream (without benadryl or aloe or other additives!) and use it 5-6 per week and you can slowly decrease how much you use it as things get better.  Take care and seek immediate care sooner if you develop any concerns.   Dr. Bufford Lope, [place]

## 2016-12-14 LAB — URINE CULTURE

## 2016-12-25 DIAGNOSIS — R339 Retention of urine, unspecified: Secondary | ICD-10-CM | POA: Diagnosis not present

## 2017-01-14 ENCOUNTER — Other Ambulatory Visit: Payer: Self-pay | Admitting: Family Medicine

## 2017-01-14 DIAGNOSIS — M159 Polyosteoarthritis, unspecified: Secondary | ICD-10-CM

## 2017-01-14 DIAGNOSIS — G629 Polyneuropathy, unspecified: Secondary | ICD-10-CM

## 2017-01-14 DIAGNOSIS — G608 Other hereditary and idiopathic neuropathies: Secondary | ICD-10-CM

## 2017-01-14 DIAGNOSIS — M549 Dorsalgia, unspecified: Secondary | ICD-10-CM

## 2017-01-14 DIAGNOSIS — G8929 Other chronic pain: Secondary | ICD-10-CM

## 2017-01-14 DIAGNOSIS — M150 Primary generalized (osteo)arthritis: Principal | ICD-10-CM

## 2017-01-14 DIAGNOSIS — N3000 Acute cystitis without hematuria: Secondary | ICD-10-CM

## 2017-01-14 NOTE — Telephone Encounter (Signed)
Daughter called because her mother needs a refill on her pain medication and also a refill on her antibiotics she was taking since she still has some burning. jw

## 2017-01-14 NOTE — Telephone Encounter (Signed)
2nd request. Pt uses [facility] on [address]. ep

## 2017-01-15 MED ORDER — HYDROCODONE-ACETAMINOPHEN 10-325 MG PO TABS: 1.0000 | ORAL_TABLET | Freq: Two times a day (BID) | ORAL | 0 refills | Status: DC | PRN

## 2017-01-15 MED ORDER — AMOXICILLIN 500 MG PO CAPS: 500.0000 mg | ORAL_CAPSULE | Freq: Three times a day (TID) | ORAL | 0 refills | Status: AC

## 2017-01-15 NOTE — Telephone Encounter (Signed)
P/t's daughter informed

## 2017-01-15 NOTE — Telephone Encounter (Signed)
Daughter is calling to check the status of her request for her mother to get refills on her antibiotics and her pain medication. Please inform patient when this is done. jw

## 2017-01-15 NOTE — Telephone Encounter (Signed)
Please let patient know her prescription(s) are available for pick up from the [facility] front desk for her hydrocodone.  Her prescription for the antibiotic was sent to her pharmacy.

## 2017-01-28 ENCOUNTER — Encounter: Payer: Self-pay | Admitting: Internal Medicine

## 2017-01-28 ENCOUNTER — Ambulatory Visit (INDEPENDENT_AMBULATORY_CARE_PROVIDER_SITE_OTHER): Payer: Medicare Other | Admitting: Internal Medicine

## 2017-01-28 DIAGNOSIS — I10 Essential (primary) hypertension: Secondary | ICD-10-CM | POA: Diagnosis not present

## 2017-01-28 MED ORDER — DILTIAZEM HCL ER COATED BEADS 180 MG PO CP24: 180.0000 mg | ORAL_CAPSULE | Freq: Every day | ORAL | 0 refills | Status: DC

## 2017-01-28 NOTE — Assessment & Plan Note (Signed)
Pt with dizziness after her Diltiazem dose was increased from 180mg  daily to 240mg  daily. - Orthostatic vitals performed in clinic today were negative. - Decrease Diltiazem dose back to 180mg  daily. - Follow-up with PCP in 1-2 weeks for recheck of blood pressure and to see if she would benefit from a second medication.

## 2017-01-28 NOTE — Progress Notes (Signed)
   [facility] Phone: [phone]  Subjective:  Ann is a 83 year old female presenting to same day clinic with blood pressure medication problems. Her PCP increased her Diltiazem dose from 180mg  to 240mg  a few months ago. Since then, she has had dizziness after taking the Diltiazem dose that lasts for at least half the day. She feels like she is going to pass out, but has not passed out or fallen. She denies any orthostatic symptoms. She was previously on HCTZ and Lisinopril, but these were stopped in April 2017 when she was hospitalized for GI bleeding. No chest pain. No lower extremity edema. She endorses occasional SOB that is her baseline. She endorses occasional palpitations, which is also her baseline.  ROS: See HPI for pertinent positives and negatives  Past Medical History- HTN, peripheral neuropathy, osteoarthritis, spinal stenosis, HLD, AVM of duodenum  Family history reviewed for today's visit. No changes.  Social history- patient is a former smoker.  Objective: LMP 12/17/1960  Gen: NAD, alert, cooperative with exam CV: RRR, no murmur Resp: CTABL, no wheezes, normal work of breathing Msk: No edema  Assessment/Plan: HTN: Pt with dizziness after her Diltiazem dose was increased from 180mg  daily to 240mg  daily. - Orthostatic vitals performed in clinic today were negative. - Decrease Diltiazem dose back to 180mg  daily. - Follow-up with PCP in 1-2 weeks for recheck of blood pressure and to see if she would benefit from a second medication.   Hyman Bible, MD PGY-2

## 2017-01-28 NOTE — Patient Instructions (Addendum)
It was so nice to meet you!  We will decrease your Diltiazem dose back to 180mg  daily. I have sent in a prescription to your pharmacy. Please schedule an appointment with Dr. McDiarmid in the next 1-2 weeks so that we can recheck your blood pressure and possibly add a second medication.  -Dr. Brett Albino

## 2017-02-07 ENCOUNTER — Ambulatory Visit (INDEPENDENT_AMBULATORY_CARE_PROVIDER_SITE_OTHER): Payer: Medicare Other | Admitting: Family Medicine

## 2017-02-07 ENCOUNTER — Encounter: Payer: Self-pay | Admitting: Family Medicine

## 2017-02-07 VITALS — BP 140/72 | HR 66 | Temp 97.8°F | Ht 59.0 in | Wt 155.0 lb

## 2017-02-07 DIAGNOSIS — K648 Other hemorrhoids: Secondary | ICD-10-CM | POA: Diagnosis not present

## 2017-02-07 DIAGNOSIS — D500 Iron deficiency anemia secondary to blood loss (chronic): Secondary | ICD-10-CM

## 2017-02-07 DIAGNOSIS — I10 Essential (primary) hypertension: Secondary | ICD-10-CM | POA: Diagnosis not present

## 2017-02-07 DIAGNOSIS — D509 Iron deficiency anemia, unspecified: Secondary | ICD-10-CM

## 2017-02-07 DIAGNOSIS — K5900 Constipation, unspecified: Secondary | ICD-10-CM | POA: Diagnosis not present

## 2017-02-07 NOTE — Patient Instructions (Signed)
Check Hemoglobin in two weeks at the [facility].

## 2017-02-08 NOTE — Progress Notes (Signed)
   Subjective:    Patient ID: Nicole Perez, female    DOB: [birth year], 83 y.o.   MRN: [id number] DIESHA BULLOUGH is accompanied by daughter Sources of clinical information for visit is/are patient, relative(s) and past medical records. Nursing assessment for this office visit was reviewed with the patient for accuracy and revision.   HPI Iron Deficiency Anemia from chronic GI bleed - Onset in April 2015 - Stable Hgb for last 4 months - Taking one iron tablet daily without GI upset. _ (+) consitpation - Denies melena and BRBPR. Denies palpitation, DOE, SOB, lightheadedness with standing, dizziness  Constipation - recent worsening, harder stool - takes Miralax with MOM with adequate results - Miralax makes her have dysuria  Hypertension - BPs at home running in 130s/ 80s on reduced dose Diltiazem 180 mg daily - No dizziness feeling with the lower dose of Diltiazem 180 mg that was having with 240 mg tab - No chest pain, no change vision, no weakness of limbs or face, no new numbness - No exercise - Watches salt inher diet.   Internal Hemorrhoid - continues to drop down outside anus when strains with hard stool. It returns sponstanous to above anus and no longer palpable.  - No rectal bleeding - occasion anal itching -   SH: smoking history reviewed. Med list reviewed and updated Review of Systems See hpi    Objective:   Physical Exam VS reviewed GEN: Alert, Cooperative, Groomed, NAD HEENT: PERRL; EAC bilaterally not occluded,  COR: RRR, No M/G/R, No JVD, Bowel sounds audible in midsternum LUNGS: BCTA, No Acc mm use, speaking in full sentences ABDOMEN: (+)BS, soft, NT, ND, No HSM, No palpable masses EXT: No peripheral leg edema Gait: slow gait using sliding walker, no swing of feet past stance foot malleolus  Psych: Normal affect/thought/speech/language   See px list

## 2017-02-08 NOTE — Assessment & Plan Note (Signed)
Established problem. Stable. Discussed options of referral for laser or banding therapy which she will consider if it becomes grade III Discussed use of Senna along with just MOM as option for constipation if she does not want tocontinue the polyethylene glycol bc of dysruia with its use.

## 2017-02-08 NOTE — Assessment & Plan Note (Signed)
Established problem. Stable. Continue current therapy on one iron tablet daily Pt Hgb stable for last 4 monthly checks Given that patient is asymptomatic and that patient would have to go to [facility] to get CBC, will wait for two weeks for patient to have the draw at [facility] lab once [employer] returns here.

## 2017-02-08 NOTE — Assessment & Plan Note (Signed)
Established problem that has improved.  Adequate BP control. Will continue reduced dose of Diltiazem 180 mg daily No need to restart Lisinopril nor HCTZ at this time

## 2017-03-04 ENCOUNTER — Other Ambulatory Visit: Payer: Self-pay | Admitting: Family Medicine

## 2017-03-04 DIAGNOSIS — Z1231 Encounter for screening mammogram for malignant neoplasm of breast: Secondary | ICD-10-CM

## 2017-03-07 DIAGNOSIS — I739 Peripheral vascular disease, unspecified: Secondary | ICD-10-CM

## 2017-03-07 HISTORY — DX: Peripheral vascular disease, unspecified: I73.9

## 2017-03-21 ENCOUNTER — Other Ambulatory Visit: Payer: Medicare Other

## 2017-03-21 ENCOUNTER — Telehealth: Payer: Self-pay | Admitting: Family Medicine

## 2017-03-21 DIAGNOSIS — D500 Iron deficiency anemia secondary to blood loss (chronic): Secondary | ICD-10-CM | POA: Diagnosis not present

## 2017-03-21 NOTE — Telephone Encounter (Signed)
questionaire for getting a roller walker w/seat, form dropped off for at front desk for completion.  Verified that patient section of form has been completed.  Last DOS/WCC with PCP was 02/07/17.  Placed form in team folder to be completed by clinical staff.  Crista Luria

## 2017-03-22 ENCOUNTER — Ambulatory Visit
Admission: RE | Admit: 2017-03-22 | Discharge: 2017-03-22 | Disposition: A | Discharge status: Home / Self Care | Payer: Medicare Other | Source: Ambulatory Visit | Attending: Family Medicine | Admitting: Family Medicine

## 2017-03-22 ENCOUNTER — Encounter: Payer: Self-pay | Admitting: Family Medicine

## 2017-03-22 DIAGNOSIS — Z1231 Encounter for screening mammogram for malignant neoplasm of breast: Secondary | ICD-10-CM | POA: Diagnosis not present

## 2017-03-22 LAB — CBC
Hematocrit: 38.2 % (ref 34.0–46.6)
Hemoglobin: 12.6 g/dL (ref 11.1–15.9)
MCH: 28.1 pg (ref 26.6–33.0)
MCHC: 33 g/dL (ref 31.5–35.7)
MCV: 85 fL (ref 79–97)
PLATELETS: 215 10*3/uL (ref 150–379)
RBC: 4.48 x10E6/uL (ref 3.77–5.28)
RDW: 14 % (ref 12.3–15.4)
WBC: 5.8 10*3/uL (ref 3.4–10.8)

## 2017-03-22 NOTE — Telephone Encounter (Signed)
Clinical info completed on health questionnaire form.  Place form in Dr. McDiarmid's box for completion.  Andreas Newport, [place]

## 2017-03-22 NOTE — Progress Notes (Signed)
Normal CBC.  No evidence GI bleeding.

## 2017-03-25 ENCOUNTER — Encounter: Payer: Self-pay | Admitting: Family Medicine

## 2017-03-25 DIAGNOSIS — Z7409 Other reduced mobility: Secondary | ICD-10-CM | POA: Insufficient documentation

## 2017-03-25 HISTORY — DX: Other reduced mobility: Z74.09

## 2017-03-25 NOTE — Telephone Encounter (Signed)
Patient informed that PCP wrote Rx for the walker/seat.  Rx placed up front for pickup.  Derl Barrow, RN

## 2017-03-30 ENCOUNTER — Other Ambulatory Visit: Payer: Self-pay | Admitting: Internal Medicine

## 2017-04-08 ENCOUNTER — Telehealth: Payer: Self-pay

## 2017-04-08 NOTE — Telephone Encounter (Signed)
Pt daughter requesting a refill of the hydrocodone for her mother. Please call daughter when ready to be picked up. Ottis Stain, CMA

## 2017-04-08 NOTE — Telephone Encounter (Signed)
Will forward to MD to advise. Rudell Ortman,CMA  

## 2017-04-08 NOTE — Telephone Encounter (Signed)
Daughter states pt needs a refill on antibiotic because symptoms from UTI are back. Pt uses [facility] [address] ep

## 2017-04-10 ENCOUNTER — Other Ambulatory Visit: Payer: Self-pay | Admitting: Family Medicine

## 2017-04-10 ENCOUNTER — Telehealth: Payer: Self-pay

## 2017-04-10 DIAGNOSIS — M159 Polyosteoarthritis, unspecified: Secondary | ICD-10-CM

## 2017-04-10 DIAGNOSIS — N3000 Acute cystitis without hematuria: Secondary | ICD-10-CM

## 2017-04-10 DIAGNOSIS — M5489 Other dorsalgia: Secondary | ICD-10-CM

## 2017-04-10 DIAGNOSIS — G608 Other hereditary and idiopathic neuropathies: Secondary | ICD-10-CM

## 2017-04-10 DIAGNOSIS — G629 Polyneuropathy, unspecified: Secondary | ICD-10-CM

## 2017-04-10 DIAGNOSIS — M549 Dorsalgia, unspecified: Secondary | ICD-10-CM

## 2017-04-10 DIAGNOSIS — M150 Primary generalized (osteo)arthritis: Principal | ICD-10-CM

## 2017-04-10 DIAGNOSIS — G8929 Other chronic pain: Secondary | ICD-10-CM

## 2017-04-10 MED ORDER — HYDROCODONE-ACETAMINOPHEN 10-325 MG PO TABS: 1.0000 | ORAL_TABLET | Freq: Two times a day (BID) | ORAL | 0 refills | Status: DC | PRN

## 2017-04-10 MED ORDER — CEPHALEXIN 500 MG PO CAPS: 500.0000 mg | ORAL_CAPSULE | Freq: Four times a day (QID) | ORAL | 1 refills | Status: AC

## 2017-04-10 NOTE — Telephone Encounter (Signed)
Pt calling to check on refill of antibiotic for UTI. She said she is burning bad. Ottis Stain, CMA

## 2017-04-10 NOTE — Telephone Encounter (Signed)
Please let patient know antibiotic send to [facility] and Please let patient know her prescription(s) are available for pick up from the [facility] front desk.

## 2017-04-10 NOTE — Telephone Encounter (Signed)
Patient is aware. Jazmin Hartsell,CMA  

## 2017-04-30 ENCOUNTER — Other Ambulatory Visit: Payer: Self-pay

## 2017-04-30 ENCOUNTER — Inpatient Hospital Stay (HOSPITAL_COMMUNITY)
Admission: EM | Admit: 2017-04-30 | Discharge: 2017-05-02 | DRG: 310 | Disposition: A | Discharge status: Home / Self Care | Payer: Medicare Other | Attending: Family Medicine | Admitting: Family Medicine

## 2017-04-30 ENCOUNTER — Encounter: Payer: Self-pay | Admitting: Family Medicine

## 2017-04-30 ENCOUNTER — Ambulatory Visit (INDEPENDENT_AMBULATORY_CARE_PROVIDER_SITE_OTHER): Payer: Medicare Other | Admitting: Family Medicine

## 2017-04-30 ENCOUNTER — Emergency Department (HOSPITAL_COMMUNITY): Payer: Medicare Other

## 2017-04-30 ENCOUNTER — Ambulatory Visit (HOSPITAL_COMMUNITY)
Admission: RE | Admit: 2017-04-30 | Discharge: 2017-04-30 | Disposition: A | Discharge status: Home / Self Care | Payer: Medicare Other | Source: Ambulatory Visit | Attending: Family Medicine | Admitting: Family Medicine

## 2017-04-30 ENCOUNTER — Encounter (HOSPITAL_COMMUNITY): Payer: Self-pay | Admitting: Nurse Practitioner

## 2017-04-30 ENCOUNTER — Ambulatory Visit (HOSPITAL_COMMUNITY): Payer: Medicare Other

## 2017-04-30 ENCOUNTER — Inpatient Hospital Stay (HOSPITAL_COMMUNITY): Payer: Medicare Other

## 2017-04-30 VITALS — BP 146/68 | HR 59 | Temp 98.4°F | Wt 155.0 lb

## 2017-04-30 DIAGNOSIS — Z66 Do not resuscitate: Secondary | ICD-10-CM | POA: Diagnosis not present

## 2017-04-30 DIAGNOSIS — I119 Hypertensive heart disease without heart failure: Secondary | ICD-10-CM | POA: Diagnosis present

## 2017-04-30 DIAGNOSIS — I455 Other specified heart block: Secondary | ICD-10-CM | POA: Diagnosis not present

## 2017-04-30 DIAGNOSIS — I10 Essential (primary) hypertension: Secondary | ICD-10-CM | POA: Diagnosis not present

## 2017-04-30 DIAGNOSIS — G629 Polyneuropathy, unspecified: Secondary | ICD-10-CM | POA: Diagnosis not present

## 2017-04-30 DIAGNOSIS — G8929 Other chronic pain: Secondary | ICD-10-CM | POA: Diagnosis present

## 2017-04-30 DIAGNOSIS — M159 Polyosteoarthritis, unspecified: Secondary | ICD-10-CM

## 2017-04-30 DIAGNOSIS — I441 Atrioventricular block, second degree: Secondary | ICD-10-CM

## 2017-04-30 DIAGNOSIS — M150 Primary generalized (osteo)arthritis: Secondary | ICD-10-CM | POA: Diagnosis not present

## 2017-04-30 DIAGNOSIS — M48061 Spinal stenosis, lumbar region without neurogenic claudication: Secondary | ICD-10-CM | POA: Diagnosis not present

## 2017-04-30 DIAGNOSIS — M549 Dorsalgia, unspecified: Secondary | ICD-10-CM

## 2017-04-30 DIAGNOSIS — E559 Vitamin D deficiency, unspecified: Secondary | ICD-10-CM | POA: Diagnosis not present

## 2017-04-30 DIAGNOSIS — I517 Cardiomegaly: Secondary | ICD-10-CM | POA: Diagnosis present

## 2017-04-30 DIAGNOSIS — R202 Paresthesia of skin: Secondary | ICD-10-CM | POA: Diagnosis not present

## 2017-04-30 DIAGNOSIS — G608 Other hereditary and idiopathic neuropathies: Secondary | ICD-10-CM

## 2017-04-30 DIAGNOSIS — R001 Bradycardia, unspecified: Secondary | ICD-10-CM | POA: Diagnosis present

## 2017-04-30 DIAGNOSIS — Z79899 Other long term (current) drug therapy: Secondary | ICD-10-CM

## 2017-04-30 DIAGNOSIS — E785 Hyperlipidemia, unspecified: Secondary | ICD-10-CM | POA: Diagnosis present

## 2017-04-30 DIAGNOSIS — K5909 Other constipation: Secondary | ICD-10-CM | POA: Diagnosis not present

## 2017-04-30 DIAGNOSIS — N318 Other neuromuscular dysfunction of bladder: Secondary | ICD-10-CM | POA: Diagnosis not present

## 2017-04-30 DIAGNOSIS — D500 Iron deficiency anemia secondary to blood loss (chronic): Secondary | ICD-10-CM | POA: Diagnosis not present

## 2017-04-30 DIAGNOSIS — I495 Sick sinus syndrome: Principal | ICD-10-CM | POA: Diagnosis present

## 2017-04-30 DIAGNOSIS — G4700 Insomnia, unspecified: Secondary | ICD-10-CM | POA: Diagnosis not present

## 2017-04-30 DIAGNOSIS — R200 Anesthesia of skin: Secondary | ICD-10-CM | POA: Insufficient documentation

## 2017-04-30 DIAGNOSIS — M4805 Spinal stenosis, thoracolumbar region: Secondary | ICD-10-CM | POA: Diagnosis not present

## 2017-04-30 DIAGNOSIS — M4804 Spinal stenosis, thoracic region: Secondary | ICD-10-CM | POA: Diagnosis not present

## 2017-04-30 HISTORY — DX: Atrioventricular block, second degree: I44.1

## 2017-04-30 LAB — BASIC METABOLIC PANEL
ANION GAP: 9 (ref 5–15)
BUN: 19 mg/dL (ref 6–20)
CALCIUM: 9 mg/dL (ref 8.9–10.3)
CO2: 22 mmol/L (ref 22–32)
CREATININE: 0.92 mg/dL (ref 0.44–1.00)
Chloride: 107 mmol/L (ref 101–111)
GFR calc Af Amer: >60 mL/min (ref >60)
GFR, EST NON AFRICAN AMERICAN: 57 mL/min — AB (ref >60)
GLUCOSE: 110 mg/dL — AB (ref 65–99)
Potassium: 4 mmol/L (ref 3.5–5.1)
Sodium: 138 mmol/L (ref 135–145)

## 2017-04-30 LAB — VITAMIN B12: VITAMIN B 12: 290 pg/mL (ref 180–914)

## 2017-04-30 LAB — CBC
HCT: 37.7 % (ref 36.0–46.0)
HEMOGLOBIN: 12.1 g/dL (ref 12.0–15.0)
MCH: 28.1 pg (ref 26.0–34.0)
MCHC: 32.1 g/dL (ref 30.0–36.0)
MCV: 87.7 fL (ref 78.0–100.0)
PLATELETS: 204 10*3/uL (ref 150–400)
RBC: 4.3 MIL/uL (ref 3.87–5.11)
RDW: 13.6 % (ref 11.5–15.5)
WBC: 4.8 10*3/uL (ref 4.0–10.5)

## 2017-04-30 LAB — TSH: TSH: 2.547 u[IU]/mL (ref 0.350–4.500)

## 2017-04-30 LAB — I-STAT TROPONIN, ED: TROPONIN I, POC: 0.01 ng/mL (ref 0.00–0.08)

## 2017-04-30 LAB — TROPONIN I

## 2017-04-30 MED ORDER — HEPARIN SODIUM (PORCINE) 5000 UNIT/ML IJ SOLN
5000.0000 [IU] | Freq: Three times a day (TID) | INTRAMUSCULAR | Status: DC
  Administered 2017-04-30 – 2017-05-02 (×5): 5000 [IU] via SUBCUTANEOUS
  Filled 2017-04-30 (×5): qty 1

## 2017-04-30 MED ORDER — MAGNESIUM HYDROXIDE 400 MG/5ML PO SUSP: 15.0000 mL | Freq: Every day | ORAL | Status: DC | PRN

## 2017-04-30 MED ORDER — POLYETHYLENE GLYCOL 3350 17 G PO PACK: 17.0000 g | PACK | Freq: Every day | ORAL | Status: DC | PRN

## 2017-04-30 MED ORDER — HYDROCODONE-ACETAMINOPHEN 10-325 MG PO TABS
1.0000 | ORAL_TABLET | Freq: Two times a day (BID) | ORAL | Status: DC | PRN
  Administered 2017-04-30: 1 via ORAL
  Filled 2017-04-30: qty 1
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
## 2017-04-30 NOTE — Assessment & Plan Note (Signed)
  New EKG finding compared to previous. Patient with arrhythmia on EKG. Patient asymptomatic. Has seen cardiologist in the past (over a year ago) and had a Holter monitor.   -patient sent to ED for evaluation, may require admission for further work up and cardiology evaluation -due to history of GI bleed patient is not a good candidate for anticoagulation, must weigh risk/benefits -may require pacemaker if persistently bradycardic -may need to discontinue diltiazem and adjust medication regimen
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
## 2017-04-30 NOTE — ED Triage Notes (Addendum)
Pt presents with c/o irregular heart rate/bardycardia and numbness. She was sent over from her PCP at [place] today for further evaluation of these complaints. She reports her heart frequently feels "off beat" but she did not notice that her heart rate had been low until her PCP did an EKG today.  She c/o numbness to BLE from hips to feet that she has been experiencing for quite some time. She does report lower back pain at times with the numbness and a history of chronic lower back pain.

## 2017-04-30 NOTE — Patient Instructions (Signed)
  Please go directly to the ED to get assessed.   Due to your numbness from the waist down I think you will need imaging of your spine  Due to the irregular heart beat and slow heart beat you will need to be evaluated in the ED and possibly admitted to the hospital to be seen by a heart doctor and have your medications changed.  Lucila Maine, DO PGY-1, [facility] 04/30/2017 11:50 AM

## 2017-04-30 NOTE — Assessment & Plan Note (Signed)
  New problem, concerning for spinal cord compression  -patient sent to ED for stat evaluation and will likely require imaging of her thoracic and lumbar spine

## 2017-04-30 NOTE — ED Notes (Signed)
ED Provider at bedside. 

## 2017-04-30 NOTE — Progress Notes (Signed)
    Subjective:    Patient ID: Nicole Perez, female    DOB: 07-23-1933, 83 y.o.   MRN: [id number]   CC: lower extremities numb  HPI:  Lower extremity numbness Reports numbness from the waist down that has been going on for the past several months. Just told her daughter about it this weekend and her daughter made an appointment to be seen. She reports she wakes up in the morning with numbness and tingling from her waist down both of her legs to her feet. It persists throughout the day. She walks with a walker but endorses some difficulty walking due to this. No falls. Denies changes in bowel/bladder habits. She has to self-cath for the past 2 years and also has constipation and hemorrhoids. She endorses pain in her back that is chronic and unchanged.   Irregular heartbeat Was hospitalized recently for large GI bleed that required prolonged hospital stay. She was told during this time that her heart was beating irregularly due to her blood counts being so low. She was taken off iron and nexium. She is on diltiazem 180mg  daily. She endorses dizziness occasionally. Denies syncope or falls. Denies chest pain or shortness of breath. States she can feel her heart skipping beats occasionally but did not think much of it.  Smoking status reviewed- former smoker  Review of Systems- see HPI   Objective:  BP (!) 146/68   Pulse (!) 59   Temp 98.4 F (36.9 C) (Oral)   Wt 155 lb (70.3 kg)   LMP 12/17/1962   BMI 31.31 kg/m  Vitals and nursing note reviewed  General: pleasant elderly lady, well nourished, in no acute distress Cardiac: bradycardic, irregular rhythm. +2 radial pulses bilaterally  Respiratory: clear to auscultation bilaterally, no increased work of breathing Extremities: no edema or cyanosis. Warm, well perfused. Decreased sensation in lower extremities bilaterally. Neuro: alert and oriented, no focal deficits. Decreased L4 pulses bilaterally. 5/5 strength in upper and lower  extremities bilaterally   EKG findings: bradycardic at 46, no discernable P-wave and irregular rhythm.  Assessment & Plan:    Bilateral leg numbness  New problem, concerning for spinal cord compression  -patient sent to ED for stat evaluation and will likely require imaging of her thoracic and lumbar spine  Bradycardia with 41-50 beats per minute  New EKG finding compared to previous. Patient with arrhythmia on EKG. Patient asymptomatic. Has seen cardiologist in the past (over a year ago) and had a Holter monitor.   -patient sent to ED for evaluation, may require admission for further work up and cardiology evaluation -due to history of GI bleed patient is not a good candidate for anticoagulation, must weigh risk/benefits -may require pacemaker if persistently bradycardic -may need to discontinue diltiazem and adjust medication regimen     No Follow-up on file.   Lucila Maine, DO Family Medicine Resident PGY-1

## 2017-04-30 NOTE — ED Notes (Signed)
Dr. Cathie Olden assessed patient and okayed for patient to go to MRI without telemetry.

## 2017-04-30 NOTE — ED Notes (Signed)
Patient transported to MRI 
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
## 2017-04-30 NOTE — ED Notes (Signed)
Attempted report x1. 

## 2017-04-30 NOTE — Progress Notes (Deleted)
    Subjective:    Patient ID: Nicole Perez, female    DOB: Sep 21, 1933, 83 y.o.   MRN: [id number]   CC:  HPI:   Smoking status reviewed  Review of Systems   Objective:  BP (!) 146/68   Pulse (!) 59   Temp 98.4 F (36.9 C) (Oral)   Wt 155 lb (70.3 kg)   LMP 12/17/1962   BMI 31.31 kg/m  Vitals and nursing note reviewed  General: well nourished, in no acute distress HEENT: normocephalic, TM's visualized bilaterally, no scleral icterus or conjunctival pallor, no nasal discharge, moist mucous membranes, good dentition without erythema or discharge noted in posterior oropharynx Neck: supple, non-tender, without lymphadenopathy Cardiac: RRR, clear S1 and S2, no murmurs, rubs, or gallops Respiratory: clear to auscultation bilaterally, no increased work of breathing Abdomen: soft, nontender, nondistended, no masses or organomegaly. Bowel sounds present Extremities: no edema or cyanosis. Warm, well perfused. 2+ radial and PT pulses bilaterally Skin: warm and dry, no rashes noted Neuro: alert and oriented, no focal deficits  EKG findings bradycardic at 46, no discernable P-wave and irregular rhythm.  Assessment & Plan:    No problem-specific Assessment & Plan notes found for this encounter.    No Follow-up on file.   Lucila Maine, DO Family Medicine Resident PGY-1

## 2017-05-01 ENCOUNTER — Other Ambulatory Visit: Payer: Self-pay

## 2017-05-01 DIAGNOSIS — R202 Paresthesia of skin: Secondary | ICD-10-CM

## 2017-05-01 DIAGNOSIS — I455 Other specified heart block: Secondary | ICD-10-CM

## 2017-05-01 LAB — CBC
HCT: 35.4 % — ABNORMAL LOW (ref 36.0–46.0)
HEMOGLOBIN: 11.3 g/dL — AB (ref 12.0–15.0)
MCH: 28.1 pg (ref 26.0–34.0)
MCHC: 31.9 g/dL (ref 30.0–36.0)
MCV: 88.1 fL (ref 78.0–100.0)
PLATELETS: 175 10*3/uL (ref 150–400)
RBC: 4.02 MIL/uL (ref 3.87–5.11)
RDW: 13.6 % (ref 11.5–15.5)
WBC: 4.5 10*3/uL (ref 4.0–10.5)

## 2017-05-01 LAB — BASIC METABOLIC PANEL
ANION GAP: 10 (ref 5–15)
BUN: 20 mg/dL (ref 6–20)
CALCIUM: 8.7 mg/dL — AB (ref 8.9–10.3)
CO2: 23 mmol/L (ref 22–32)
CREATININE: 0.94 mg/dL (ref 0.44–1.00)
Chloride: 107 mmol/L (ref 101–111)
GFR calc Af Amer: >60 mL/min (ref >60)
GFR, EST NON AFRICAN AMERICAN: 55 mL/min — AB (ref >60)
GLUCOSE: 89 mg/dL (ref 65–99)
Potassium: 3.9 mmol/L (ref 3.5–5.1)
Sodium: 140 mmol/L (ref 135–145)

## 2017-05-01 LAB — TROPONIN I: Troponin I: <0.03 ng/mL (ref <0.03)

## 2017-05-01 MED ORDER — HYDROCODONE-ACETAMINOPHEN 10-325 MG PO TABS
1.0000 | ORAL_TABLET | ORAL | Status: AC
  Administered 2017-05-01: 1 via ORAL
  Filled 2017-05-01: qty 1

## 2017-05-01 MED ORDER — HYDROCODONE-ACETAMINOPHEN 10-325 MG PO TABS
0.5000 | ORAL_TABLET | Freq: Four times a day (QID) | ORAL | Status: DC | PRN
  Administered 2017-05-01 – 2017-05-02 (×2): 1 via ORAL
  Filled 2017-05-01 (×2): qty 1

## 2017-05-01 NOTE — Progress Notes (Signed)
PT Cancellation Note  Patient Details Name: Nicole Perez MRN: [id number] DOB: 03/06/1933   Cancelled Treatment:    Reason Eval/Treat Not Completed: PT screened, no needs identified, will sign off. Per OT pt mobilizing without difficulty.   Nelson 05/01/2017, 12:01 PM [place]

## 2017-05-01 NOTE — Consult Note (Signed)
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 level where the thecal sac is essentially effaced. Neuro surgical consultation recommended. 2. 8 mm anterolisthesis of L4 on L5 with associated disc bulge and advanced facet arthropathy, with resultant severe bilateral L4 foraminal stenosis. 3. Sequelae of remote posterior decompression at L4-5 and L5-S1. Electronically Signed   By: Jeannine Boga M.D.   On: 04/30/2017 20:15   Mr Lumbar Spine Wo Contrast  Result Date: 04/30/2017 CLINICAL DATA:  Initial evaluation for lower extremity numbness. EXAM: MRI THORACIC AND LUMBAR SPINE WITHOUT CONTRAST  TECHNIQUE: Multiplanar and multiecho pulse sequences of the thoracic and lumbar spine were obtained without intravenous contrast. COMPARISON:  Prior MRI from 11/17/2013. FINDINGS: MRI THORACIC SPINE FINDINGS Alignment: Trace anterolisthesis of T2 on T3. Vertebral bodies otherwise normally aligned with preservation of the normal thoracic kyphosis. Vertebrae: Vertebral body heights are maintained. No evidence for acute,  subacute, or chronic fracture. Reactive endplate changes present about the T7-8 and T12-L1 interspaces. Prominent hemangioma is noted at the T11 and T12 levels. No worrisome osseous lesions. Signal intensity within the vertebral body bone marrow otherwise within normal limits. Cord:  Signal intensity within the thoracic spinal cord is normal. Paraspinal and other soft tissues: Paraspinous soft tissues demonstrate no acute abnormality. Partially visualized lungs are grossly clear. Visualized visceral structures grossly unremarkable. Disc levels: Multilevel degenerate spondylolysis noted within the partially visualized cervical spine on counter sequence, not well evaluated on this exam. T1-2: Diffuse degenerative disc bulge with intervertebral disc space narrowing. Facet and ligamentum flavum hypertrophy. Mild spinal stenosis. T2-3: Trace anterolisthesis of T2 on T3. Diffuse disc bulge with disc desiccation. Posterior element hypertrophy. Resultant mild spinal stenosis. T3-4: Minimal disc bulge. Posterior element hypertrophy. No significant stenosis. T4-5: Mild disc bulge. Posterior element hypertrophy. No significant stenosis. T5-6: Minimal disc bulge with posterior element hypertrophy. No stenosis. T6-7: Diffuse disc bulge with superimposed right paracentral disc protrusion (series 7, image 20). Protruding disc abuts the ventral thoracic cord with flattening of the right hemi cord. Posterior element hypertrophy with secondary mild flattening of the posterior aspect of the thoracic spinal cord as well. No cord signal changes. Resultant moderate canal stenosis. T7-8: Diffuse degenerative disc bulge largely effaces the ventral thecal sac. Posterior line approach of the. Prominent reactive endplate changes. Mild canal stenosis. T8-9: Mild disc bulge with posterior left protrudes the. No stenosis. T9-10: Diffuse degenerative disc bulge with superimposed small left paracentral disc protrusion (series 7, image 29).  Protruding disc abuts the left ventral aspect of the thoracic spinal cord. Mild cord flattening without cord signal changes. Posterior element hypertrophy. Resultant mild canal stenosis. T10-11: Diffuse degenerative disc bulge with intervertebral disc space narrowing. Posterior element hypertrophy. Resultant moderate canal stenosis. Perineural nerve root sleeve cyst noted on the left at this level. Moderate bilateral foraminal narrowing. T11-12: Diffuse degenerative disc bulge with disc desiccation and intervertebral disc space narrowing. Prominent bilateral facet arthrosis. Resultant moderate canal stenosis. Moderate to advanced right with moderate left foraminal stenosis. T12-L1: Diffuse degenerative disc bulge with disc desiccation and intervertebral disc space narrowing. Reactive endplate changes. Advanced bilateral facet arthrosis, slightly worse on the right. Resultant severe canal stenosis. Thecal sac measures approximately 4 mm in AP diameter (series 7, image 38). Fairly severe bilateral foraminal narrowing present as well, slightly worse on the left. MRI LUMBAR SPINE FINDINGS Segmentation: Normal segmentation. Lowest well-formed disc is labeled the L5-S1 level. Alignment: 8 mm anterolisthesis of L4 on L5. Trace 2 mm anterolisthesis of L3 on L4. Mild dextroscoliosis. Vertebral bodies otherwise normally aligned with preservation of the normal lumbar lordosis. Vertebrae: Vertebral body heights maintained without evidence for acute or chronic fracture. Prominent reactive endplate changes noted about the T12-L1, L2-3, L4-5, and L5-S1 interspaces. Scattered benign hemangiomas noted. No worrisome osseous lesions. Conus medullaris: Extends to the L1-2 level and appears normal. Paraspinal and other soft tissues: Paraspinous soft tissues demonstrate no acute abnormality. Oblong cystic lesion within the left iliacus muscle noted, of doubtful clinical significance. Visualized visceral structures within normal limits.  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 L1-2: Chronic diffuse degenerative disc bulge with disc desiccation and intervertebral disc space narrowing. Advanced bilateral facet arthrosis. Resultant severe canal and bilateral subarticular stenosis. Thecal sac measures approximately 5 mm in AP diameter. Moderate left with mild right foraminal stenosis. L2-3: Chronic diffuse degenerative intervertebral disc space narrowing with disc desiccation and intervertebral disc space narrowing. Superimposed small central disc protrusion at noted (series 14, image 10). Advanced bilateral facet arthrosis. Resultant severe canal and bilateral subarticular stenosis with the thecal sac measuring 4 mm in AP diameter. Mild to moderate bilateral foraminal stenosis. L3-4: Trace anterolisthesis of L3 on L4. Diffuse disc bulge with disc desiccation. Advanced facet arthrosis with ligamentum flavum hypertrophy. Resultant severe canal and bilateral subarticular stenosis. Thecal sac is essentially effaced, measuring approximately 3-4 mm in AP diameter (series 14, image 15). Mild to moderate bilateral foraminal stenosis, slightly worse on the left. L4-5: 8 mm anterolisthesis of L4 on L5. Associated broad posterior pseudo disc bulge, eccentric to the left. Prominent reactive endplate changes. Advanced bilateral facet arthrosis, right slightly worse than left. Patient appears to be status post remote posterior decompression at this level. Persistent fairly severe canal and bilateral subarticular stenosis, worse on the left. Thecal sac measures approximately 5 mm in AP diameter. Severe bilateral foraminal stenosis. L5-S1: Chronic diffuse intervertebral disc space narrowing with diffuse disc bulge, disc desiccation, and reactive endplate changes. Moderate bilateral facet arthrosis. Patient appears to be status post remote posterior decompression. Resultant moderate canal  stenosis, with mild to moderate bilateral foraminal narrowing. IMPRESSION: MR THORACIC SPINE IMPRESSION Advanced multilevel degenerative disc disease and facet arthrosis as detailed above. Resultant multifocal canal stenosis, severe at the T12-L1 level, more moderate nature at T6-7, T10-11, and T11-12. Please see above report for a full description of these findings. MR LUMBAR SPINE IMPRESSION 1. 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 There is severe canal narrowing at essentially all levels extending from L1-2 through L4-5, and is most severe at the L3-4 level where the thecal sac is essentially effaced. Neuro surgical consultation recommended. 2. 8 mm anterolisthesis of L4 on L5 with associated disc bulge and advanced facet arthropathy, with resultant severe bilateral L4 foraminal stenosis. 3. Sequelae of remote posterior decompression at L4-5 and L5-S1. Electronically Signed   By: Jeannine Boga M.D.   On: 04/30/2017 20:15   Dg Chest Portable 1 View  Result Date: 04/30/2017 CLINICAL DATA:  Bradycardia EXAM: PORTABLE CHEST 1 VIEW COMPARISON:  04/17/2016 FINDINGS: Cardiomegaly and chronic aortic tortuosity. There is no edema, consolidation, effusion, or pneumothorax. IMPRESSION: 1. No acute finding. 2. Cardiomegaly. Electronically Signed   By: Monte Fantasia M.D.   On: 04/30/2017 13:59    Review of Systems  Constitutional: Negative.   Skin: Negative.   Neurological: Positive for focal weakness.   Blood pressure (!) 136/49, pulse (!) 39, temperature 98.4 F (36.9 C), temperature source Oral, resp. rate 18, height 4' 9" (1.448 m), weight 69.9 kg (154 lb 3.2 oz), last menstrual period 12/17/1962, SpO2 100 %. Physical Exam  Constitutional: She is oriented to person, place, and time. She appears well-developed and well-nourished.  HENT:  Head: Normocephalic.  Eyes: Pupils are equal, round, and reactive  to light.  Neck: Normal range of motion. Neck supple.  Respiratory: Effort normal.  Musculoskeletal: Normal range of motion.  Neurological: She is alert and oriented to person, place, and time.  4-/5 DF and EHL on the right 5/5 strength to  DF and PF on the left  Skin: Skin is warm and dry.  Psychiatric: She has a normal mood and affect. Her behavior is normal.    Assessment/Plan: Her MRI does reveal moderate to severe stenosis throuhgout her TL spine and xrays also show show instability. Neurologically she is stable. Given the scope of the surgery she would need, I do continue to recommend nonoperative treatment and pain medication as needed.  She is very much on board with this plan. She is welcome to call the office for an additional appointment if she feels that her symptoms become unlivable.   [place] 05/01/2017, 1:19 PM

## 2017-05-01 NOTE — Progress Notes (Signed)
Family Medicine Teaching Service Daily Progress Note Intern Pager: [phone]  Patient name: Nicole Perez Medical record number: [id number] Date of birth: 07/05/1933 Age: 83 y.o. Gender: female  Primary Care Provider: McDiarmid, Nicole Ohara, MD Consultants: cardiology Code Status: DNR  Pt Overview and Major Events to Date:  5/15 admitted with bradycardia and bilateral LE numbness 5/16 MRI with significant stenosis, ortho (follows with Dr. Lynann Perez) consulted  Assessment and Plan: Perez Nicole is a 83 y.o. female presenting with dizziness, bradycardia, and bilateral lower extremity numbness. PMH is significant for HLD, insomnia, HTN, OA, chronic back pain, spinal stenosis of thoracic and lumbar region, vit D deficiency, neurogenic bladder, peripheral neuropathy, anemia (iron deficiency).   Dizziness and bradycardia: patient with history of sinus arrhythmia/bradycardia based on her EKG from 04/22/2016. She was evaluated by cardiology in the setting of acute ST depression during active GI bleed during 04/2016. Outpatient follow up with cards 06/14/16 - recommended Myoview, patient did not complete this. Repeat EKG with ectopic atrial rhythm and ventricular trigeminy.  -cardiology consulted from ED, appreciate recs: hold dilt -cardiac monitoring -Hold Diltiazem   Bilateral lower extremity numbness: history of evaluation by [facility] who found severe spinal stenosis on MRI in 2014. Patient elected to forego surgery. Has history of back surgery 30 years ago. Neuro exam normal. She has 4+/5 motor strength in right LE (chronic) and 5/5 in left LE. Light sensation, patelar reflex and proprioception intact. Vitamin B12 normal. TSH 2.5. MRI thoracic spine with Advanced multilevel degenerative disc disease and facet arthrosis. Resultant multifocal canal stenosis, severe at the T12-L1 level, more moderate nature at T6-7, T10-11, and T11-12. Advanced multilevel degenerative spondylolysis and facet  arthrosis with resultant severe multilevel canal stenosis. There is severe canal narrowing at essentially all levels extending from L1-2 through L4-5, and is most severe at the L3-4 level where the thecal sac is essentially effaced. Neuro surgical consultation recommended. -RPR -consult spine ortho (Dr. Lynann Perez follows as an outpatient)  Chronic back pain/OA of multiple joints: patient has been evaluated by [facility] in the past and found to have severe spinal stenosis L1-2, L2-3, L3-4 on MRI in 2014. Discussed with PCP Dr. McDiarmid and through shared decision making, patient has been treated with long term narcotics in hopes to avoid surgery.  -continue home norco  History of GI bleed: patient has had several recurrent GI bleeds due to multiple GI AVMs, requiring transfer to [facility]. Hemoglobin stable today. Given stable hemoglobin, will give heparin subq as DVT prophylaxis.  -daily CBC  HTN: history of HTN. On diltiazem at home.  -hold diltiazem given bradycardia  History of Urinary retention/cystocele: I&O self-cath at home -I&O cath as needed  Chronic constipation:  -Continue home MOM  FEN/GI: heart healthy  Prophylaxis: heparin subq  Disposition: continued inpatient management of bradycardia and numbness  Subjective:  Patient denies dizziness this morning, including while walking to the bathroom last night. Continued LE numbness.   Objective: Temp:  [98 F (36.7 C)-98.4 F (36.9 C)] 98.4 F (36.9 C) (05/16 0500) Pulse Rate:  [36-85] 39 (05/16 0500) Resp:  [12-25] 18 (05/16 0500) BP: (98-146)/(49-105) 136/49 (05/16 0500) SpO2:  [94 %-100 %] 100 % (05/16 0500) Weight:  [154 lb 3.2 oz (69.9 kg)-155 lb (70.3 kg)] 154 lb 3.2 oz (69.9 kg) (05/15 2011) Physical Exam: General: Well appearing elderly female resting in bed inNAD.  Cardiovascular: bradycardia, regular rhythm, no murmur Respiratory: CTAB, easy WOB Abdomen: SNTND, +BS  Extremities: no edema,  warm  Laboratory:  Recent Labs Lab 04/30/17 1224 05/01/17 0543  WBC 4.8 4.5  HGB 12.1 11.3*  HCT 37.7 35.4*  PLT 204 175    Recent Labs Lab 04/30/17 1224 05/01/17 0543  NA 138 140  K 4.0 3.9  CL 107 107  CO2 22 23  BUN 19 20  CREATININE 0.92 0.94  CALCIUM 9.0 8.7*  GLUCOSE 110* 89   TSH 2.5 B12 290  Imaging/Diagnostic Tests: Mr Thoracic Spine Wo Contrast  Result Date: 04/30/2017 CLINICAL DATA:  Initial evaluation for lower extremity numbness. EXAM: MRI THORACIC AND LUMBAR SPINE WITHOUT CONTRAST TECHNIQUE: Multiplanar and multiecho pulse sequences of the thoracic and lumbar spine were obtained without intravenous contrast. COMPARISON:  Prior MRI from 11/17/2013. FINDINGS: MRI THORACIC SPINE FINDINGS Alignment: Trace anterolisthesis of T2 on T3. Vertebral bodies otherwise normally aligned with preservation of the normal thoracic kyphosis. Vertebrae: Vertebral body heights are maintained. No evidence for acute, subacute, or chronic fracture. Reactive endplate changes present about the T7-8 and T12-L1 interspaces. Prominent hemangioma is noted at the T11 and T12 levels. No worrisome osseous lesions. Signal intensity within the vertebral body bone marrow otherwise within normal limits. Cord:  Signal intensity within the thoracic spinal cord is normal. Paraspinal and other soft tissues: Paraspinous soft tissues demonstrate no acute abnormality. Partially visualized lungs are grossly clear. Visualized visceral structures grossly unremarkable. Disc levels: Multilevel degenerate spondylolysis noted within the partially visualized cervical spine on counter sequence, not well evaluated on this exam. T1-2: Diffuse degenerative disc bulge with intervertebral disc space narrowing. Facet and ligamentum flavum hypertrophy. Mild spinal stenosis. T2-3: Trace anterolisthesis of T2 on T3. Diffuse disc bulge with disc desiccation. Posterior element hypertrophy. Resultant mild spinal stenosis. T3-4:  Minimal disc bulge. Posterior element hypertrophy. No significant stenosis. T4-5: Mild disc bulge. Posterior element hypertrophy. No significant stenosis. T5-6: Minimal disc bulge with posterior element hypertrophy. No stenosis. T6-7: Diffuse disc bulge with superimposed right paracentral disc protrusion (series 7, image 20). Protruding disc abuts the ventral thoracic cord with flattening of the right hemi cord. Posterior element hypertrophy with secondary mild flattening of the posterior aspect of the thoracic spinal cord as well. No cord signal changes. Resultant moderate canal stenosis. T7-8: Diffuse degenerative disc bulge largely effaces the ventral thecal sac. Posterior line approach of the. Prominent reactive endplate changes. Mild canal stenosis. T8-9: Mild disc bulge with posterior left protrudes the. No stenosis. T9-10: Diffuse degenerative disc bulge with superimposed small left paracentral disc protrusion (series 7, image 29). Protruding disc abuts the left ventral aspect of the thoracic spinal cord. Mild cord flattening without cord signal changes. Posterior element hypertrophy. Resultant mild canal stenosis. T10-11: Diffuse degenerative disc bulge with intervertebral disc space narrowing. Posterior element hypertrophy. Resultant moderate canal stenosis. Perineural nerve root sleeve cyst noted on the left at this level. Moderate bilateral foraminal narrowing. T11-12: Diffuse degenerative disc bulge with disc desiccation and intervertebral disc space narrowing. Prominent bilateral facet arthrosis. Resultant moderate canal stenosis. Moderate to advanced right with moderate left foraminal stenosis. T12-L1: Diffuse degenerative disc bulge with disc desiccation and intervertebral disc space narrowing. Reactive endplate changes. Advanced bilateral facet arthrosis, slightly worse on the right. Resultant severe canal stenosis. Thecal sac measures approximately 4 mm in AP diameter (series 7, image 38). Fairly  severe bilateral foraminal narrowing present as well, slightly worse on the left. MRI LUMBAR SPINE FINDINGS Segmentation: Normal segmentation. Lowest well-formed disc is labeled the L5-S1 level. Alignment: 8 mm anterolisthesis of L4 on L5. Trace 2 mm anterolisthesis of  L3 on L4. Mild dextroscoliosis. Vertebral bodies otherwise normally aligned with preservation of the normal lumbar lordosis. Vertebrae: Vertebral body heights maintained without evidence for acute or chronic fracture. Prominent reactive endplate changes noted about the T12-L1, L2-3, L4-5, and L5-S1 interspaces. Scattered benign hemangiomas noted. No worrisome osseous lesions. Conus medullaris: Extends to the L1-2 level and appears normal. Paraspinal and other soft tissues: Paraspinous soft tissues demonstrate no acute abnormality. Oblong cystic lesion within the left iliacus muscle noted, of doubtful clinical significance. Visualized visceral structures within normal limits. 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 L1-2: Chronic diffuse degenerative disc bulge with disc desiccation and intervertebral disc space narrowing. Advanced bilateral facet arthrosis. Resultant severe canal and bilateral subarticular stenosis. Thecal sac measures approximately 5 mm in AP diameter. Moderate left with mild right foraminal stenosis. L2-3: Chronic diffuse degenerative intervertebral disc space narrowing with disc desiccation and intervertebral disc space narrowing. Superimposed small central disc protrusion at noted (series 14, image 10). Advanced bilateral facet arthrosis. Resultant severe canal and bilateral subarticular stenosis with the thecal sac measuring 4 mm in AP diameter. Mild to moderate bilateral foraminal stenosis. L3-4: Trace anterolisthesis of L3 on L4. Diffuse disc bulge with disc desiccation. Advanced facet arthrosis with ligamentum flavum hypertrophy. Resultant severe canal  and bilateral subarticular stenosis. Thecal sac is essentially effaced, measuring approximately 3-4 mm in AP diameter (series 14, image 15). Mild to moderate bilateral foraminal stenosis, slightly worse on the left. L4-5: 8 mm anterolisthesis of L4 on L5. Associated broad posterior pseudo disc bulge, eccentric to the left. Prominent reactive endplate changes. Advanced bilateral facet arthrosis, right slightly worse than left. Patient appears to be status post remote posterior decompression at this level. Persistent fairly severe canal and bilateral subarticular stenosis, worse on the left. Thecal sac measures approximately 5 mm in AP diameter. Severe bilateral foraminal stenosis. L5-S1: Chronic diffuse intervertebral disc space narrowing with diffuse disc bulge, disc desiccation, and reactive endplate changes. Moderate bilateral facet arthrosis. Patient appears to be status post remote posterior decompression. Resultant moderate canal stenosis, with mild to moderate bilateral foraminal narrowing. IMPRESSION: MR THORACIC SPINE IMPRESSION Advanced multilevel degenerative disc disease and facet arthrosis as detailed above. Resultant multifocal canal stenosis, severe at the T12-L1 level, more moderate nature at T6-7, T10-11, and T11-12. Please see above report for a full description of these findings. MR LUMBAR SPINE IMPRESSION 1. 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 There is severe canal narrowing at essentially all levels extending from L1-2 through L4-5, and is most severe at the L3-4 level where the thecal sac is essentially effaced. Neuro surgical consultation recommended. 2. 8 mm anterolisthesis of L4 on L5 with associated disc bulge and advanced facet arthropathy, with resultant severe bilateral L4 foraminal stenosis. 3. Sequelae of remote posterior decompression at L4-5 and L5-S1. Electronically Signed   By: Jeannine Boga M.D.   On: 04/30/2017 20:15   Mr Lumbar Spine Wo Contrast  Result Date: 04/30/2017 CLINICAL DATA:  Initial evaluation for lower extremity numbness. EXAM: MRI THORACIC AND LUMBAR SPINE WITHOUT CONTRAST TECHNIQUE: Multiplanar and multiecho pulse sequences of the thoracic and lumbar spine were obtained without intravenous contrast. COMPARISON:  Prior MRI from 11/17/2013. FINDINGS: MRI THORACIC SPINE FINDINGS Alignment: Trace anterolisthesis of T2 on T3. Vertebral bodies otherwise normally aligned with preservation of the normal thoracic kyphosis. Vertebrae: Vertebral body heights are maintained. No evidence for acute, subacute, or chronic fracture.  Reactive endplate changes present about the T7-8 and T12-L1 interspaces. Prominent hemangioma is noted at the T11 and T12 levels. No worrisome osseous lesions. Signal intensity within the vertebral body bone marrow otherwise within normal limits. Cord:  Signal intensity within the thoracic spinal cord is normal. Paraspinal and other soft tissues: Paraspinous soft tissues demonstrate no acute abnormality. Partially visualized lungs are grossly clear. Visualized visceral structures grossly unremarkable. Disc levels: Multilevel degenerate spondylolysis noted within the partially visualized cervical spine on counter sequence, not well evaluated on this exam. T1-2: Diffuse degenerative disc bulge with intervertebral disc space narrowing. Facet and ligamentum flavum hypertrophy. Mild spinal stenosis. T2-3: Trace anterolisthesis of T2 on T3. Diffuse disc bulge with disc desiccation. Posterior element hypertrophy. Resultant mild spinal stenosis. T3-4: Minimal disc bulge. Posterior element hypertrophy. No significant stenosis. T4-5: Mild disc bulge. Posterior element hypertrophy. No significant stenosis. T5-6: Minimal disc bulge with posterior element hypertrophy. No stenosis. T6-7: Diffuse disc bulge with superimposed right paracentral disc protrusion (series 7,  image 20). Protruding disc abuts the ventral thoracic cord with flattening of the right hemi cord. Posterior element hypertrophy with secondary mild flattening of the posterior aspect of the thoracic spinal cord as well. No cord signal changes. Resultant moderate canal stenosis. T7-8: Diffuse degenerative disc bulge largely effaces the ventral thecal sac. Posterior line approach of the. Prominent reactive endplate changes. Mild canal stenosis. T8-9: Mild disc bulge with posterior left protrudes the. No stenosis. T9-10: Diffuse degenerative disc bulge with superimposed small left paracentral disc protrusion (series 7, image 29). Protruding disc abuts the left ventral aspect of the thoracic spinal cord. Mild cord flattening without cord signal changes. Posterior element hypertrophy. Resultant mild canal stenosis. T10-11: Diffuse degenerative disc bulge with intervertebral disc space narrowing. Posterior element hypertrophy. Resultant moderate canal stenosis. Perineural nerve root sleeve cyst noted on the left at this level. Moderate bilateral foraminal narrowing. T11-12: Diffuse degenerative disc bulge with disc desiccation and intervertebral disc space narrowing. Prominent bilateral facet arthrosis. Resultant moderate canal stenosis. Moderate to advanced right with moderate left foraminal stenosis. T12-L1: Diffuse degenerative disc bulge with disc desiccation and intervertebral disc space narrowing. Reactive endplate changes. Advanced bilateral facet arthrosis, slightly worse on the right. Resultant severe canal stenosis. Thecal sac measures approximately 4 mm in AP diameter (series 7, image 38). Fairly severe bilateral foraminal narrowing present as well, slightly worse on the left. MRI LUMBAR SPINE FINDINGS Segmentation: Normal segmentation. Lowest well-formed disc is labeled the L5-S1 level. Alignment: 8 mm anterolisthesis of L4 on L5. Trace 2 mm anterolisthesis of L3 on L4. Mild dextroscoliosis. Vertebral bodies  otherwise normally aligned with preservation of the normal lumbar lordosis. Vertebrae: Vertebral body heights maintained without evidence for acute or chronic fracture. Prominent reactive endplate changes noted about the T12-L1, L2-3, L4-5, and L5-S1 interspaces. Scattered benign hemangiomas noted. No worrisome osseous lesions. Conus medullaris: Extends to the L1-2 level and appears normal. Paraspinal and other soft tissues: Paraspinous soft tissues demonstrate no acute abnormality. Oblong cystic lesion within the left iliacus muscle noted, of doubtful clinical significance. Visualized visceral structures within normal limits. 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 L1-2: Chronic diffuse degenerative disc bulge with disc desiccation and intervertebral disc space narrowing. Advanced bilateral facet arthrosis. Resultant severe canal and bilateral subarticular stenosis. Thecal sac measures approximately 5 mm in AP diameter. Moderate left with mild right foraminal stenosis. L2-3: Chronic diffuse degenerative intervertebral disc space narrowing with disc desiccation and intervertebral disc space narrowing.  Superimposed small central disc protrusion at noted (series 14, image 10). Advanced bilateral facet arthrosis. Resultant severe canal and bilateral subarticular stenosis with the thecal sac measuring 4 mm in AP diameter. Mild to moderate bilateral foraminal stenosis. L3-4: Trace anterolisthesis of L3 on L4. Diffuse disc bulge with disc desiccation. Advanced facet arthrosis with ligamentum flavum hypertrophy. Resultant severe canal and bilateral subarticular stenosis. Thecal sac is essentially effaced, measuring approximately 3-4 mm in AP diameter (series 14, image 15). Mild to moderate bilateral foraminal stenosis, slightly worse on the left. L4-5: 8 mm anterolisthesis of L4 on L5. Associated broad posterior pseudo disc bulge, eccentric to the  left. Prominent reactive endplate changes. Advanced bilateral facet arthrosis, right slightly worse than left. Patient appears to be status post remote posterior decompression at this level. Persistent fairly severe canal and bilateral subarticular stenosis, worse on the left. Thecal sac measures approximately 5 mm in AP diameter. Severe bilateral foraminal stenosis. L5-S1: Chronic diffuse intervertebral disc space narrowing with diffuse disc bulge, disc desiccation, and reactive endplate changes. Moderate bilateral facet arthrosis. Patient appears to be status post remote posterior decompression. Resultant moderate canal stenosis, with mild to moderate bilateral foraminal narrowing. IMPRESSION: MR THORACIC SPINE IMPRESSION Advanced multilevel degenerative disc disease and facet arthrosis as detailed above. Resultant multifocal canal stenosis, severe at the T12-L1 level, more moderate nature at T6-7, T10-11, and T11-12. Please see above report for a full description of these findings. MR LUMBAR SPINE IMPRESSION 1. 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 There is severe canal narrowing at essentially all levels extending from L1-2 through L4-5, and is most severe at the L3-4 level where the thecal sac is essentially effaced. Neuro surgical consultation recommended. 2. 8 mm anterolisthesis of L4 on L5 with associated disc bulge and advanced facet arthropathy, with resultant severe bilateral L4 foraminal stenosis. 3. Sequelae of remote posterior decompression at L4-5 and L5-S1. Electronically Signed   By: Jeannine Boga M.D.   On: 04/30/2017 20:15   Dg Chest Portable 1 View  Result Date: 04/30/2017 CLINICAL DATA:  Bradycardia EXAM: PORTABLE CHEST 1 VIEW COMPARISON:  04/17/2016 FINDINGS: Cardiomegaly and chronic aortic tortuosity. There is no edema, consolidation, effusion, or pneumothorax. IMPRESSION: 1. No acute  finding. 2. Cardiomegaly. Electronically Signed   By: Monte Fantasia M.D.   On: 04/30/2017 13:59     Sela Hilding, MD 05/01/2017, 7:02 AM PGY-1, [place] Intern pager: [phone], text pages welcome

## 2017-05-01 NOTE — Evaluation (Signed)
Occupational Therapy Evaluation Patient Details Name: Nicole Perez MRN: [id number] DOB: Feb 20, 1933 Today's Date: 05/01/2017    History of Present Illness 83 yo female admitted with dizziness bradycardia and BIL LE numbness PMH: anemia, AKI, AVM, chronic back pain, bladder neurogenous, Cervical stenosis, spinal stenosis, OA, memory impairment   Clinical Impression   Patient evaluated by Occupational Therapy with no further acute OT needs identified. All education has been completed and the patient has no further questions. See below for any follow-up Occupational Therapy or equipment needs. OT to sign off. Thank you for referral.      Follow Up Recommendations  No OT follow up    Equipment Recommendations  None recommended by OT    Recommendations for Other Services       Precautions / Restrictions Precautions Precautions: Fall Restrictions Weight Bearing Restrictions: No      Mobility Bed Mobility               General bed mobility comments: in chair on arrival   Transfers Overall transfer level: Independent                    Balance                                           ADL either performed or assessed with clinical judgement   ADL Overall ADL's : Independent                                       General ADL Comments: Pt has family (A) if needed. pt takes a sponge bath every other day and uses shower on the opposite day. pt demonstrates ability to complete transfer     Vision Baseline Vision/History: No visual deficits       Perception     Praxis      Pertinent Vitals/Pain Pain Assessment: No/denies pain Pain Location: BIL LE hip to feet  Pain Descriptors / Indicators: Tingling     Hand Dominance Right   Extremity/Trunk Assessment Upper Extremity Assessment Upper Extremity Assessment: Overall WFL for tasks assessed   Lower Extremity Assessment Lower Extremity Assessment: RLE  deficits/detail;LLE deficits/detail RLE Deficits / Details: tingling is greater than L LE. motor functional WFL, reports being able to feel the floor with ambulation,. able to feel light touch with testing. Pt able to bend down to toes LLE Deficits / Details: motor functional WFL, reports being able to feel the floor with ambulation,. able to feel light touch with testing. Pt able to bend down to toes   Cervical / Trunk Assessment Cervical / Trunk Assessment: Kyphotic   Communication Communication Communication: No difficulties   Cognition Arousal/Alertness: Awake/alert Behavior During Therapy: WFL for tasks assessed/performed Overall Cognitive Status: Within Functional Limits for tasks assessed                                     General Comments       Exercises     Shoulder Instructions      Home Living Family/patient expects to be discharged to:: Private residence Living Arrangements: Alone Available Help at Discharge: Family Type of Home: House Home Access: Ramped entrance     Home Layout:  One level     Bathroom Shower/Tub: Occupational psychologist: Standard     Home Equipment: Clinical cytogeneticist - 2 wheels;Walker - 4 wheels   Additional Comments: pt reports family is consistantly at her house checking on her.       Prior Functioning/Environment Level of Independence: Independent        Comments: uses a walker but occassionally furniture walks. pt educated this session that RW is a must alll the time        OT Problem List:        OT Treatment/Interventions:      OT Goals(Current goals can be found in the care plan section)    OT Frequency:     Barriers to D/C:            Co-evaluation              AM-PAC PT "6 Clicks" Daily Activity     Outcome Measure Help from another person eating meals?: None Help from another person taking care of personal grooming?: None Help from another person toileting, which includes  using toliet, bedpan, or urinal?: None Help from another person bathing (including washing, rinsing, drying)?: None Help from another person to put on and taking off regular upper body clothing?: None Help from another person to put on and taking off regular lower body clothing?: None 6 Click Score: 24   End of Session Equipment Utilized During Treatment: Rolling walker Nurse Communication: Mobility status;Precautions  Activity Tolerance: Patient tolerated treatment well Patient left: in chair;with call bell/phone within reach;with family/visitor present  OT Visit Diagnosis: Unsteadiness on feet (R26.81)                Time: 3568-6168 OT Time Calculation (min): 21 min Charges:  OT General Charges $OT Visit: 1 Procedure OT Evaluation $OT Eval Low Complexity: 1 Procedure G-Codes:      Jeri Modena   OTR/L Pager: [phone] Office: [phone] .   Parke Poisson B 05/01/2017, 12:10 PM

## 2017-05-01 NOTE — Discharge Summary (Signed)
[facility] Discharge Summary  Patient name: Nicole Perez Medical record number: [id number] Date of birth: 22-Aug-1933 Age: 83 y.o. Gender: female Date of Admission: 04/30/2017  Date of Discharge: 05/02/17  Admitting Physician: Nicole Covert, MD  Primary Care Provider: McDiarmid, Blane Ohara, MD Consultants: cardiology  Indication for Hospitalization: bradycardia, BLE numbness  Discharge Diagnoses/Problem List:  Bradycardia, improved Bilateral lower extremity weakness 2/2 severe spinal stenosis Dysuria (?UTI) in the setting of neurogenic bladder Chronic back pain History of GI bleed HTN Chronic constipation  Disposition: home  Discharge Condition: stable  Discharge Exam: see progress note from day of discharge  Brief Hospital Course:  Nicole Perez presented from clinic to the ED with symptomatic bradycardia in the 30s and junctional pacer on EKG. She initially presented to clinic for bilateral lower extremity weakness for several months in the setting of known severe spinal stenosis. She noted that she often feels dizzy and feels her heart beat as "funny." On diltiazem from home for ?Hx of PAF and HTN. For her bradycardia, she was placed on tele and diltiazem was held. Troponins were negative x 3. Cardiology was consulted and felt that improvement off of dilt was sufficient, although pacer may be required in the future. HR in 70s on discharge with improvement in dizziness. For lower extremity numbness, thoracic and lumbar MRI showed worsening spinal stenosis. Her outpatient spine or so was consulted, who saw her and felt that conservative management was appropriate. Norco was increased from twice a day to 3 times a day with half a tab to be taken at lunchtime. Additionally, on the day of discharge patient noted some burning around her urethra. Urine with nitrite suspicious for possible UTI, patient has these chronically. Last urine culture with multiple  species, prior to that Escherichia coli with sensitivity to Keflex. Keflex was apparently prescribed and urine culture will be followed up as an outpatient.  Issues for Follow Up:  1. Bilateral lower extremity numbness - imaging as below, Nicole Perez saw and suggested continued conservative management.  2. Bradycardia - cardiology felt asymptomatic 2nd degree Mobitz type 1 at times (mostly overnight). May need pacing in the future, but not at present. Per cardiology, If antihypertensive meds need to be restarted, avoid beta blockers, diltiazem, verapamil or clonidine. 3. Patient and family endorsed snoring and apneas at night. Recommend sleep study as an outpatient. 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 She would probably not do well with a conventional CPAP device, but may be able to use a full face mask or nasal pillows. ENT advice may be necessary."  4. Vitamin D found to be low, given 50,000 IUs once weekly. May consider recheck in 6-8 weeks.   Significant Procedures: none  Significant Labs and Imaging:   Recent Labs Lab 04/30/17 1224 05/01/17 0543 05/02/17 0248  WBC 4.8 4.5 4.0  HGB 12.1 11.3* 11.2*  HCT 37.7 35.4* 34.4*  PLT 204 175 178    Recent Labs Lab 04/30/17 1224 05/01/17 0543 05/02/17 0248  NA 138 140 139  K 4.0 3.9 3.8  CL 107 107 108  CO2 22 23 25   GLUCOSE 110* 89 99  BUN 19 20 20   CREATININE 0.92 0.94 0.99  CALCIUM 9.0 8.7* 8.5*     Mr Thoracic Spine Wo Contrast  Result Date: 04/30/2017 CLINICAL DATA:  Initial evaluation for lower extremity numbness. EXAM: MRI THORACIC AND LUMBAR SPINE WITHOUT CONTRAST TECHNIQUE: Multiplanar  and multiecho pulse sequences of the thoracic and lumbar spine were obtained without intravenous contrast. COMPARISON:  Prior MRI from 11/17/2013. FINDINGS: MRI THORACIC SPINE FINDINGS Alignment: Trace anterolisthesis of T2 on T3. Vertebral bodies otherwise normally  aligned with preservation of the normal thoracic kyphosis. Vertebrae: Vertebral body heights are maintained. No evidence for acute, subacute, or chronic fracture. Reactive endplate changes present about the T7-8 and T12-L1 interspaces. Prominent hemangioma is noted at the T11 and T12 levels. No worrisome osseous lesions. Signal intensity within the vertebral body bone marrow otherwise within normal limits. Cord:  Signal intensity within the thoracic spinal cord is normal. Paraspinal and other soft tissues: Paraspinous soft tissues demonstrate no acute abnormality. Partially visualized lungs are grossly clear. Visualized visceral structures grossly unremarkable. Disc levels: Multilevel degenerate spondylolysis noted within the partially visualized cervical spine on counter sequence, not well evaluated on this exam. T1-2: Diffuse degenerative disc bulge with intervertebral disc space narrowing. Facet and ligamentum flavum hypertrophy. Mild spinal stenosis. T2-3: Trace anterolisthesis of T2 on T3. Diffuse disc bulge with disc desiccation. Posterior element hypertrophy. Resultant mild spinal stenosis. T3-4: Minimal disc bulge. Posterior element hypertrophy. No significant stenosis. T4-5: Mild disc bulge. Posterior element hypertrophy. No significant stenosis. T5-6: Minimal disc bulge with posterior element hypertrophy. No stenosis. T6-7: Diffuse disc bulge with superimposed right paracentral disc protrusion (series 7, image 20). Protruding disc abuts the ventral thoracic cord with flattening of the right hemi cord. Posterior element hypertrophy with secondary mild flattening of the posterior aspect of the thoracic spinal cord as well. No cord signal changes. Resultant moderate canal stenosis. T7-8: Diffuse degenerative disc bulge largely effaces the ventral thecal sac. Posterior line approach of the. Prominent reactive endplate changes. Mild canal stenosis. T8-9: Mild disc bulge with posterior left protrudes the. No  stenosis. T9-10: Diffuse degenerative disc bulge with superimposed small left paracentral disc protrusion (series 7, image 29). Protruding disc abuts the left ventral aspect of the thoracic spinal cord. Mild cord flattening without cord signal changes. Posterior element hypertrophy. Resultant mild canal stenosis. T10-11: Diffuse degenerative disc bulge with intervertebral disc space narrowing. Posterior element hypertrophy. Resultant moderate canal stenosis. Perineural nerve root sleeve cyst noted on the left at this level. Moderate bilateral foraminal narrowing. T11-12: Diffuse degenerative disc bulge with disc desiccation and intervertebral disc space narrowing. Prominent bilateral facet arthrosis. Resultant moderate canal stenosis. Moderate to advanced right with moderate left foraminal stenosis. T12-L1: Diffuse degenerative disc bulge with disc desiccation and intervertebral disc space narrowing. Reactive endplate changes. Advanced bilateral facet arthrosis, slightly worse on the right. Resultant severe canal stenosis. Thecal sac measures approximately 4 mm in AP diameter (series 7, image 38). Fairly severe bilateral foraminal narrowing present as well, slightly worse on the left. MRI LUMBAR SPINE FINDINGS Segmentation: Normal segmentation. Lowest well-formed disc is labeled the L5-S1 level. Alignment: 8 mm anterolisthesis of L4 on L5. Trace 2 mm anterolisthesis of L3 on L4. Mild dextroscoliosis. Vertebral bodies otherwise normally aligned with preservation of the normal lumbar lordosis. Vertebrae: Vertebral body heights maintained without evidence for acute or chronic fracture. Prominent reactive endplate changes noted about the T12-L1, L2-3, L4-5, and L5-S1 interspaces. Scattered benign hemangiomas noted. No worrisome osseous lesions. Conus medullaris: Extends to the L1-2 level and appears normal. Paraspinal and other soft tissues: Paraspinous soft tissues demonstrate no acute abnormality. Oblong cystic lesion  within the left iliacus muscle noted, of doubtful clinical significance. Visualized visceral structures within normal limits. 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 L1-2: Chronic diffuse degenerative disc bulge with disc desiccation and intervertebral disc space narrowing. Advanced bilateral facet arthrosis. Resultant severe canal and bilateral subarticular stenosis. Thecal sac measures approximately 5 mm in AP diameter. Moderate left with mild right foraminal stenosis. L2-3: Chronic diffuse degenerative intervertebral disc space narrowing with disc desiccation and intervertebral disc space narrowing. Superimposed small central disc protrusion at noted (series 14, image 10). Advanced bilateral facet arthrosis. Resultant severe canal and bilateral subarticular stenosis with the thecal sac measuring 4 mm in AP diameter. Mild to moderate bilateral foraminal stenosis. L3-4: Trace anterolisthesis of L3 on L4. Diffuse disc bulge with disc desiccation. Advanced facet arthrosis with ligamentum flavum hypertrophy. Resultant severe canal and bilateral subarticular stenosis. Thecal sac is essentially effaced, measuring approximately 3-4 mm in AP diameter (series 14, image 15). Mild to moderate bilateral foraminal stenosis, slightly worse on the left. L4-5: 8 mm anterolisthesis of L4 on L5. Associated broad posterior pseudo disc bulge, eccentric to the left. Prominent reactive endplate changes. Advanced bilateral facet arthrosis, right slightly worse than left. Patient appears to be status post remote posterior decompression at this level. Persistent fairly severe canal and bilateral subarticular stenosis, worse on the left. Thecal sac measures approximately 5 mm in AP diameter. Severe bilateral foraminal stenosis. L5-S1: Chronic diffuse intervertebral disc space narrowing with diffuse disc bulge, disc desiccation, and reactive endplate changes.  Moderate bilateral facet arthrosis. Patient appears to be status post remote posterior decompression. Resultant moderate canal stenosis, with mild to moderate bilateral foraminal narrowing. IMPRESSION: MR THORACIC SPINE IMPRESSION Advanced multilevel degenerative disc disease and facet arthrosis as detailed above. Resultant multifocal canal stenosis, severe at the T12-L1 level, more moderate nature at T6-7, T10-11, and T11-12. Please see above report for a full description of these findings. MR LUMBAR SPINE IMPRESSION 1. 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 There is severe canal narrowing at essentially all levels extending from L1-2 through L4-5, and is most severe at the L3-4 level where the thecal sac is essentially effaced. Neuro surgical consultation recommended. 2. 8 mm anterolisthesis of L4 on L5 with associated disc bulge and advanced facet arthropathy, with resultant severe bilateral L4 foraminal stenosis. 3. Sequelae of remote posterior decompression at L4-5 and L5-S1. Electronically Signed   By: Jeannine Boga M.D.   On: 04/30/2017 20:15   Mr Lumbar Spine Wo Contrast  Result Date: 04/30/2017 CLINICAL DATA:  Initial evaluation for lower extremity numbness. EXAM: MRI THORACIC AND LUMBAR SPINE WITHOUT CONTRAST TECHNIQUE: Multiplanar and multiecho pulse sequences of the thoracic and lumbar spine were obtained without intravenous contrast. COMPARISON:  Prior MRI from 11/17/2013. FINDINGS: MRI THORACIC SPINE FINDINGS Alignment: Trace anterolisthesis of T2 on T3. Vertebral bodies otherwise normally aligned with preservation of the normal thoracic kyphosis. Vertebrae: Vertebral body heights are maintained. No evidence for acute, subacute, or chronic fracture. Reactive endplate changes present about the T7-8 and T12-L1 interspaces. Prominent hemangioma is noted at the T11 and T12 levels. No worrisome osseous  lesions. Signal intensity within the vertebral body bone marrow otherwise within normal limits. Cord:  Signal intensity within the thoracic spinal cord is normal. Paraspinal and other soft tissues: Paraspinous soft tissues demonstrate no acute abnormality. Partially visualized lungs are grossly clear. Visualized visceral structures grossly unremarkable. Disc levels: Multilevel degenerate spondylolysis noted within the partially visualized cervical spine on counter sequence, not well evaluated on this exam. T1-2: Diffuse degenerative disc bulge with intervertebral disc space narrowing. Facet and  ligamentum flavum hypertrophy. Mild spinal stenosis. T2-3: Trace anterolisthesis of T2 on T3. Diffuse disc bulge with disc desiccation. Posterior element hypertrophy. Resultant mild spinal stenosis. T3-4: Minimal disc bulge. Posterior element hypertrophy. No significant stenosis. T4-5: Mild disc bulge. Posterior element hypertrophy. No significant stenosis. T5-6: Minimal disc bulge with posterior element hypertrophy. No stenosis. T6-7: Diffuse disc bulge with superimposed right paracentral disc protrusion (series 7, image 20). Protruding disc abuts the ventral thoracic cord with flattening of the right hemi cord. Posterior element hypertrophy with secondary mild flattening of the posterior aspect of the thoracic spinal cord as well. No cord signal changes. Resultant moderate canal stenosis. T7-8: Diffuse degenerative disc bulge largely effaces the ventral thecal sac. Posterior line approach of the. Prominent reactive endplate changes. Mild canal stenosis. T8-9: Mild disc bulge with posterior left protrudes the. No stenosis. T9-10: Diffuse degenerative disc bulge with superimposed small left paracentral disc protrusion (series 7, image 29). Protruding disc abuts the left ventral aspect of the thoracic spinal cord. Mild cord flattening without cord signal changes. Posterior element hypertrophy. Resultant mild canal stenosis.  T10-11: Diffuse degenerative disc bulge with intervertebral disc space narrowing. Posterior element hypertrophy. Resultant moderate canal stenosis. Perineural nerve root sleeve cyst noted on the left at this level. Moderate bilateral foraminal narrowing. T11-12: Diffuse degenerative disc bulge with disc desiccation and intervertebral disc space narrowing. Prominent bilateral facet arthrosis. Resultant moderate canal stenosis. Moderate to advanced right with moderate left foraminal stenosis. T12-L1: Diffuse degenerative disc bulge with disc desiccation and intervertebral disc space narrowing. Reactive endplate changes. Advanced bilateral facet arthrosis, slightly worse on the right. Resultant severe canal stenosis. Thecal sac measures approximately 4 mm in AP diameter (series 7, image 38). Fairly severe bilateral foraminal narrowing present as well, slightly worse on the left. MRI LUMBAR SPINE FINDINGS Segmentation: Normal segmentation. Lowest well-formed disc is labeled the L5-S1 level. Alignment: 8 mm anterolisthesis of L4 on L5. Trace 2 mm anterolisthesis of L3 on L4. Mild dextroscoliosis. Vertebral bodies otherwise normally aligned with preservation of the normal lumbar lordosis. Vertebrae: Vertebral body heights maintained without evidence for acute or chronic fracture. Prominent reactive endplate changes noted about the T12-L1, L2-3, L4-5, and L5-S1 interspaces. Scattered benign hemangiomas noted. No worrisome osseous lesions. Conus medullaris: Extends to the L1-2 level and appears normal. Paraspinal and other soft tissues: Paraspinous soft tissues demonstrate no acute abnormality. Oblong cystic lesion within the left iliacus muscle noted, of doubtful clinical significance. Visualized visceral structures within normal limits. 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 L1-2: Chronic diffuse degenerative disc bulge with disc desiccation and  intervertebral disc space narrowing. Advanced bilateral facet arthrosis. Resultant severe canal and bilateral subarticular stenosis. Thecal sac measures approximately 5 mm in AP diameter. Moderate left with mild right foraminal stenosis. L2-3: Chronic diffuse degenerative intervertebral disc space narrowing with disc desiccation and intervertebral disc space narrowing. Superimposed small central disc protrusion at noted (series 14, image 10). Advanced bilateral facet arthrosis. Resultant severe canal and bilateral subarticular stenosis with the thecal sac measuring 4 mm in AP diameter. Mild to moderate bilateral foraminal stenosis. L3-4: Trace anterolisthesis of L3 on L4. Diffuse disc bulge with disc desiccation. Advanced facet arthrosis with ligamentum flavum hypertrophy. Resultant severe canal and bilateral subarticular stenosis. Thecal sac is essentially effaced, measuring approximately 3-4 mm in AP diameter (series 14, image 15). Mild to moderate bilateral foraminal stenosis, slightly worse on the left. L4-5: 8 mm anterolisthesis of L4 on L5. Associated broad posterior  pseudo disc bulge, eccentric to the left. Prominent reactive endplate changes. Advanced bilateral facet arthrosis, right slightly worse than left. Patient appears to be status post remote posterior decompression at this level. Persistent fairly severe canal and bilateral subarticular stenosis, worse on the left. Thecal sac measures approximately 5 mm in AP diameter. Severe bilateral foraminal stenosis. L5-S1: Chronic diffuse intervertebral disc space narrowing with diffuse disc bulge, disc desiccation, and reactive endplate changes. Moderate bilateral facet arthrosis. Patient appears to be status post remote posterior decompression. Resultant moderate canal stenosis, with mild to moderate bilateral foraminal narrowing. IMPRESSION: MR THORACIC SPINE IMPRESSION Advanced multilevel degenerative disc disease and facet arthrosis as detailed above.  Resultant multifocal canal stenosis, severe at the T12-L1 level, more moderate nature at T6-7, T10-11, and T11-12. Please see above report for a full description of these findings. MR LUMBAR SPINE IMPRESSION 1. 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 There is severe canal narrowing at essentially all levels extending from L1-2 through L4-5, and is most severe at the L3-4 level where the thecal sac is essentially effaced. Neuro surgical consultation recommended. 2. 8 mm anterolisthesis of L4 on L5 with associated disc bulge and advanced facet arthropathy, with resultant severe bilateral L4 foraminal stenosis. 3. Sequelae of remote posterior decompression at L4-5 and L5-S1. Electronically Signed   By: Jeannine Boga M.D.   On: 04/30/2017 20:15   Dg Chest Portable 1 View  Result Date: 04/30/2017 CLINICAL DATA:  Bradycardia EXAM: PORTABLE CHEST 1 VIEW COMPARISON:  04/17/2016 FINDINGS: Cardiomegaly and chronic aortic tortuosity. There is no edema, consolidation, effusion, or pneumothorax. IMPRESSION: 1. No acute finding. 2. Cardiomegaly. Electronically Signed   By: Monte Fantasia M.D.   On: 04/30/2017 13:59     Results/Tests Pending at Time of Discharge: urine culture  Discharge Medications:  Allergies as of 05/02/2017      Reactions   Nitrofurantoin Other (See Comments)   Malaise and profound fatigue      Medication List    STOP taking these medications   CARTIA XT 180 MG 24 hr capsule Generic drug:  diltiazem     TAKE these medications   conjugated estrogens vaginal cream Commonly known as:  PREMARIN Place 1 Applicatorful vaginally daily.   HYDROcodone-acetaminophen 10-325 MG tablet Commonly known as:  NORCO Take 1 tablet by mouth 3 (three) times daily. Take 1 tablet in the morning, 1/2 tablet at lunchtime, and 1 tablet in the evening. What changed:  when to take this  reasons to take  this  additional instructions  Another medication with the same name was removed. Continue taking this medication, and follow the directions you see here.   magnesium hydroxide 400 MG/5ML suspension Commonly known as:  MILK OF MAGNESIA Take 15 mLs by mouth daily as needed for mild constipation.   polyethylene glycol powder powder Commonly known as:  GLYCOLAX/MIRALAX TAKE 17 GRAMS BY MOUTH DAILY AS NEEDED FOR MILD, MODERATE, OR SEVERE CONSTIPATION   Vitamin D (Ergocalciferol) 50000 units Caps capsule Commonly known as:  DRISDOL Take 1 capsule (50,000 Units total) by mouth every 7 (seven) days. Start taking on:  05/09/2017       Discharge Instructions: Please refer to Patient Instructions section of EMR for full details.  Patient was counseled important signs and symptoms that should prompt return to medical care, changes in medications, dietary instructions, activity restrictions, and follow up appointments.   Follow-Up Appointments: Follow-up Information    Bufford Lope,  DO. Go on 05/06/2017.   Why:  9am for hospital follow up Contact information: [place] [address] [phone]           Sela Hilding, MD 05/02/2017, 12:10 PM PGY-1, [place]

## 2017-05-01 NOTE — Progress Notes (Signed)
Progress Note  Patient Name: Nicole Perez Date of Encounter: 05/01/2017  Primary Cardiologist: Hilty  Subjective   Feels better, more energy.  Inpatient Medications    Scheduled Meds: . heparin  5,000 Units Subcutaneous Q8H  . HYDROcodone-acetaminophen  1 tablet Oral NOW   Continuous Infusions:  PRN Meds: HYDROcodone-acetaminophen, magnesium hydroxide, polyethylene glycol   Vital Signs    Vitals:   04/30/17 1645 04/30/17 1715 04/30/17 2011 05/01/17 0500  BP: 125/85 (!) 127/52 121/62 (!) 136/49  Pulse: (!) 47 (!) 49 84 (!) 39  Resp:   18 18  Temp:   98 F (36.7 C) 98.4 F (36.9 C)  TempSrc:   Oral Oral  SpO2: 97% 97% 100% 100%  Weight:   154 lb 3.2 oz (69.9 kg)   Height:   4\' 9"  (1.448 m)     Intake/Output Summary (Last 24 hours) at 05/01/17 1218 Last data filed at 05/01/17 0818  Gross per 24 hour  Intake              240 ml  Output              450 ml  Net             -210 ml   Filed Weights   04/30/17 1219 04/30/17 2011  Weight: 155 lb (70.3 kg) 154 lb 3.2 oz (69.9 kg)    Telemetry    Junctional rhythm in 40s overnight, now NSR with occasional pauses (suspect second degree sinoatrial block, MT1) and HR 70-80s- Personally Reviewed  ECG    Probably Sinus rhythm with 2:1 SA exit block  and junctional escape beats- Personally Reviewed  Physical Exam  Smiling eating lunch GEN: No acute distress.   Neck: No JVD Cardiac: RRR, no murmurs, rubs, or gallops.  Respiratory: Clear to auscultation bilaterally. GI: Soft, nontender, non-distended  MS: No edema; No deformity. Neuro:  Nonfocal  Psych: Normal affect   Labs    Chemistry Recent Labs Lab 04/30/17 1224 05/01/17 0543  NA 138 140  K 4.0 3.9  CL 107 107  CO2 22 23  GLUCOSE 110* 89  BUN 19 20  CREATININE 0.92 0.94  CALCIUM 9.0 8.7*  GFRNONAA 57* 55*  GFRAA >60 >60  ANIONGAP 9 10     Hematology Recent Labs Lab 04/30/17 1224 05/01/17 0543  WBC 4.8 4.5  RBC 4.30 4.02  HGB 12.1  11.3*  HCT 37.7 35.4*  MCV 87.7 88.1  MCH 28.1 28.1  MCHC 32.1 31.9  RDW 13.6 13.6  PLT 204 175    Cardiac Enzymes Recent Labs Lab 04/30/17 1817 05/01/17 0001 05/01/17 0543  TROPONINI <0.03 <0.03 <0.03    Recent Labs Lab 04/30/17 1238  TROPIPOC 0.01     BNPNo results for input(s): BNP, PROBNP in the last 168 hours.   DDimer No results for input(s): DDIMER in the last 168 hours.   Radiology    Mr Thoracic Spine Wo Contrast  Result Date: 04/30/2017 CLINICAL DATA:  Initial evaluation for lower extremity numbness. EXAM: MRI THORACIC AND LUMBAR SPINE WITHOUT CONTRAST TECHNIQUE: Multiplanar and multiecho pulse sequences of the thoracic and lumbar spine were obtained without intravenous contrast. COMPARISON:  Prior MRI from 11/17/2013. FINDINGS: MRI THORACIC SPINE FINDINGS Alignment: Trace anterolisthesis of T2 on T3. Vertebral bodies otherwise normally aligned with preservation of the normal thoracic kyphosis. Vertebrae: Vertebral body heights are maintained. No evidence for acute, subacute, or chronic fracture. Reactive endplate changes present about the T7-8 and T12-L1  interspaces. Prominent hemangioma is noted at the T11 and T12 levels. No worrisome osseous lesions. Signal intensity within the vertebral body bone marrow otherwise within normal limits. Cord:  Signal intensity within the thoracic spinal cord is normal. Paraspinal and other soft tissues: Paraspinous soft tissues demonstrate no acute abnormality. Partially visualized lungs are grossly clear. Visualized visceral structures grossly unremarkable. Disc levels: Multilevel degenerate spondylolysis noted within the partially visualized cervical spine on counter sequence, not well evaluated on this exam. T1-2: Diffuse degenerative disc bulge with intervertebral disc space narrowing. Facet and ligamentum flavum hypertrophy. Mild spinal stenosis. T2-3: Trace anterolisthesis of T2 on T3. Diffuse disc bulge with disc desiccation.  Posterior element hypertrophy. Resultant mild spinal stenosis. T3-4: Minimal disc bulge. Posterior element hypertrophy. No significant stenosis. T4-5: Mild disc bulge. Posterior element hypertrophy. No significant stenosis. T5-6: Minimal disc bulge with posterior element hypertrophy. No stenosis. T6-7: Diffuse disc bulge with superimposed right paracentral disc protrusion (series 7, image 20). Protruding disc abuts the ventral thoracic cord with flattening of the right hemi cord. Posterior element hypertrophy with secondary mild flattening of the posterior aspect of the thoracic spinal cord as well. No cord signal changes. Resultant moderate canal stenosis. T7-8: Diffuse degenerative disc bulge largely effaces the ventral thecal sac. Posterior line approach of the. Prominent reactive endplate changes. Mild canal stenosis. T8-9: Mild disc bulge with posterior left protrudes the. No stenosis. T9-10: Diffuse degenerative disc bulge with superimposed small left paracentral disc protrusion (series 7, image 29). Protruding disc abuts the left ventral aspect of the thoracic spinal cord. Mild cord flattening without cord signal changes. Posterior element hypertrophy. Resultant mild canal stenosis. T10-11: Diffuse degenerative disc bulge with intervertebral disc space narrowing. Posterior element hypertrophy. Resultant moderate canal stenosis. Perineural nerve root sleeve cyst noted on the left at this level. Moderate bilateral foraminal narrowing. T11-12: Diffuse degenerative disc bulge with disc desiccation and intervertebral disc space narrowing. Prominent bilateral facet arthrosis. Resultant moderate canal stenosis. Moderate to advanced right with moderate left foraminal stenosis. T12-L1: Diffuse degenerative disc bulge with disc desiccation and intervertebral disc space narrowing. Reactive endplate changes. Advanced bilateral facet arthrosis, slightly worse on the right. Resultant severe canal stenosis. Thecal sac  measures approximately 4 mm in AP diameter (series 7, image 38). Fairly severe bilateral foraminal narrowing present as well, slightly worse on the left. MRI LUMBAR SPINE FINDINGS Segmentation: Normal segmentation. Lowest well-formed disc is labeled the L5-S1 level. Alignment: 8 mm anterolisthesis of L4 on L5. Trace 2 mm anterolisthesis of L3 on L4. Mild dextroscoliosis. Vertebral bodies otherwise normally aligned with preservation of the normal lumbar lordosis. Vertebrae: Vertebral body heights maintained without evidence for acute or chronic fracture. Prominent reactive endplate changes noted about the T12-L1, L2-3, L4-5, and L5-S1 interspaces. Scattered benign hemangiomas noted. No worrisome osseous lesions. Conus medullaris: Extends to the L1-2 level and appears normal. Paraspinal and other soft tissues: Paraspinous soft tissues demonstrate no acute abnormality. Oblong cystic lesion within the left iliacus muscle noted, of doubtful clinical significance. Visualized visceral structures within normal limits. 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 L1-2: Chronic diffuse degenerative disc bulge with disc desiccation and intervertebral disc space narrowing. Advanced bilateral facet arthrosis. Resultant severe canal and bilateral subarticular stenosis. Thecal sac measures approximately 5 mm in AP diameter. Moderate left with mild right foraminal stenosis. L2-3: Chronic diffuse degenerative intervertebral disc space narrowing with disc desiccation and intervertebral disc space narrowing. Superimposed small central disc protrusion at noted (series 14,  image 10). Advanced bilateral facet arthrosis. Resultant severe canal and bilateral subarticular stenosis with the thecal sac measuring 4 mm in AP diameter. Mild to moderate bilateral foraminal stenosis. L3-4: Trace anterolisthesis of L3 on L4. Diffuse disc bulge with disc desiccation. Advanced  facet arthrosis with ligamentum flavum hypertrophy. Resultant severe canal and bilateral subarticular stenosis. Thecal sac is essentially effaced, measuring approximately 3-4 mm in AP diameter (series 14, image 15). Mild to moderate bilateral foraminal stenosis, slightly worse on the left. L4-5: 8 mm anterolisthesis of L4 on L5. Associated broad posterior pseudo disc bulge, eccentric to the left. Prominent reactive endplate changes. Advanced bilateral facet arthrosis, right slightly worse than left. Patient appears to be status post remote posterior decompression at this level. Persistent fairly severe canal and bilateral subarticular stenosis, worse on the left. Thecal sac measures approximately 5 mm in AP diameter. Severe bilateral foraminal stenosis. L5-S1: Chronic diffuse intervertebral disc space narrowing with diffuse disc bulge, disc desiccation, and reactive endplate changes. Moderate bilateral facet arthrosis. Patient appears to be status post remote posterior decompression. Resultant moderate canal stenosis, with mild to moderate bilateral foraminal narrowing. IMPRESSION: MR THORACIC SPINE IMPRESSION Advanced multilevel degenerative disc disease and facet arthrosis as detailed above. Resultant multifocal canal stenosis, severe at the T12-L1 level, more moderate nature at T6-7, T10-11, and T11-12. Please see above report for a full description of these findings. MR LUMBAR SPINE IMPRESSION 1. 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 There is severe canal narrowing at essentially all levels extending from L1-2 through L4-5, and is most severe at the L3-4 level where the thecal sac is essentially effaced. Neuro surgical consultation recommended. 2. 8 mm anterolisthesis of L4 on L5 with associated disc bulge and advanced facet arthropathy, with resultant severe bilateral L4 foraminal stenosis. 3. Sequelae of remote  posterior decompression at L4-5 and L5-S1. Electronically Signed   By: Jeannine Boga M.D.   On: 04/30/2017 20:15   Mr Lumbar Spine Wo Contrast  Result Date: 04/30/2017 CLINICAL DATA:  Initial evaluation for lower extremity numbness. EXAM: MRI THORACIC AND LUMBAR SPINE WITHOUT CONTRAST TECHNIQUE: Multiplanar and multiecho pulse sequences of the thoracic and lumbar spine were obtained without intravenous contrast. COMPARISON:  Prior MRI from 11/17/2013. FINDINGS: MRI THORACIC SPINE FINDINGS Alignment: Trace anterolisthesis of T2 on T3. Vertebral bodies otherwise normally aligned with preservation of the normal thoracic kyphosis. Vertebrae: Vertebral body heights are maintained. No evidence for acute, subacute, or chronic fracture. Reactive endplate changes present about the T7-8 and T12-L1 interspaces. Prominent hemangioma is noted at the T11 and T12 levels. No worrisome osseous lesions. Signal intensity within the vertebral body bone marrow otherwise within normal limits. Cord:  Signal intensity within the thoracic spinal cord is normal. Paraspinal and other soft tissues: Paraspinous soft tissues demonstrate no acute abnormality. Partially visualized lungs are grossly clear. Visualized visceral structures grossly unremarkable. Disc levels: Multilevel degenerate spondylolysis noted within the partially visualized cervical spine on counter sequence, not well evaluated on this exam. T1-2: Diffuse degenerative disc bulge with intervertebral disc space narrowing. Facet and ligamentum flavum hypertrophy. Mild spinal stenosis. T2-3: Trace anterolisthesis of T2 on T3. Diffuse disc bulge with disc desiccation. Posterior element hypertrophy. Resultant mild spinal stenosis. T3-4: Minimal disc bulge. Posterior element hypertrophy. No significant stenosis. T4-5: Mild disc bulge. Posterior element hypertrophy. No significant stenosis. T5-6: Minimal disc bulge with posterior element hypertrophy. No stenosis. T6-7:  Diffuse disc bulge with superimposed right paracentral disc protrusion (series 7,  image 20). Protruding disc abuts the ventral thoracic cord with flattening of the right hemi cord. Posterior element hypertrophy with secondary mild flattening of the posterior aspect of the thoracic spinal cord as well. No cord signal changes. Resultant moderate canal stenosis. T7-8: Diffuse degenerative disc bulge largely effaces the ventral thecal sac. Posterior line approach of the. Prominent reactive endplate changes. Mild canal stenosis. T8-9: Mild disc bulge with posterior left protrudes the. No stenosis. T9-10: Diffuse degenerative disc bulge with superimposed small left paracentral disc protrusion (series 7, image 29). Protruding disc abuts the left ventral aspect of the thoracic spinal cord. Mild cord flattening without cord signal changes. Posterior element hypertrophy. Resultant mild canal stenosis. T10-11: Diffuse degenerative disc bulge with intervertebral disc space narrowing. Posterior element hypertrophy. Resultant moderate canal stenosis. Perineural nerve root sleeve cyst noted on the left at this level. Moderate bilateral foraminal narrowing. T11-12: Diffuse degenerative disc bulge with disc desiccation and intervertebral disc space narrowing. Prominent bilateral facet arthrosis. Resultant moderate canal stenosis. Moderate to advanced right with moderate left foraminal stenosis. T12-L1: Diffuse degenerative disc bulge with disc desiccation and intervertebral disc space narrowing. Reactive endplate changes. Advanced bilateral facet arthrosis, slightly worse on the right. Resultant severe canal stenosis. Thecal sac measures approximately 4 mm in AP diameter (series 7, image 38). Fairly severe bilateral foraminal narrowing present as well, slightly worse on the left. MRI LUMBAR SPINE FINDINGS Segmentation: Normal segmentation. Lowest well-formed disc is labeled the L5-S1 level. Alignment: 8 mm anterolisthesis of L4 on  L5. Trace 2 mm anterolisthesis of L3 on L4. Mild dextroscoliosis. Vertebral bodies otherwise normally aligned with preservation of the normal lumbar lordosis. Vertebrae: Vertebral body heights maintained without evidence for acute or chronic fracture. Prominent reactive endplate changes noted about the T12-L1, L2-3, L4-5, and L5-S1 interspaces. Scattered benign hemangiomas noted. No worrisome osseous lesions. Conus medullaris: Extends to the L1-2 level and appears normal. Paraspinal and other soft tissues: Paraspinous soft tissues demonstrate no acute abnormality. Oblong cystic lesion within the left iliacus muscle noted, of doubtful clinical significance. Visualized visceral structures within normal limits. 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 L1-2: Chronic diffuse degenerative disc bulge with disc desiccation and intervertebral disc space narrowing. Advanced bilateral facet arthrosis. Resultant severe canal and bilateral subarticular stenosis. Thecal sac measures approximately 5 mm in AP diameter. Moderate left with mild right foraminal stenosis. L2-3: Chronic diffuse degenerative intervertebral disc space narrowing with disc desiccation and intervertebral disc space narrowing. Superimposed small central disc protrusion at noted (series 14, image 10). Advanced bilateral facet arthrosis. Resultant severe canal and bilateral subarticular stenosis with the thecal sac measuring 4 mm in AP diameter. Mild to moderate bilateral foraminal stenosis. L3-4: Trace anterolisthesis of L3 on L4. Diffuse disc bulge with disc desiccation. Advanced facet arthrosis with ligamentum flavum hypertrophy. Resultant severe canal and bilateral subarticular stenosis. Thecal sac is essentially effaced, measuring approximately 3-4 mm in AP diameter (series 14, image 15). Mild to moderate bilateral foraminal stenosis, slightly worse on the left. L4-5: 8 mm  anterolisthesis of L4 on L5. Associated broad posterior pseudo disc bulge, eccentric to the left. Prominent reactive endplate changes. Advanced bilateral facet arthrosis, right slightly worse than left. Patient appears to be status post remote posterior decompression at this level. Persistent fairly severe canal and bilateral subarticular stenosis, worse on the left. Thecal sac measures approximately 5 mm in AP diameter. Severe bilateral foraminal stenosis. L5-S1: Chronic diffuse intervertebral disc space narrowing with diffuse disc bulge,  disc desiccation, and reactive endplate changes. Moderate bilateral facet arthrosis. Patient appears to be status post remote posterior decompression. Resultant moderate canal stenosis, with mild to moderate bilateral foraminal narrowing. IMPRESSION: MR THORACIC SPINE IMPRESSION Advanced multilevel degenerative disc disease and facet arthrosis as detailed above. Resultant multifocal canal stenosis, severe at the T12-L1 level, more moderate nature at T6-7, T10-11, and T11-12. Please see above report for a full description of these findings. MR LUMBAR SPINE IMPRESSION 1. 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 There is severe canal narrowing at essentially all levels extending from L1-2 through L4-5, and is most severe at the L3-4 level where the thecal sac is essentially effaced. Neuro surgical consultation recommended. 2. 8 mm anterolisthesis of L4 on L5 with associated disc bulge and advanced facet arthropathy, with resultant severe bilateral L4 foraminal stenosis. 3. Sequelae of remote posterior decompression at L4-5 and L5-S1. Electronically Signed   By: Jeannine Boga M.D.   On: 04/30/2017 20:15   Dg Chest Portable 1 View  Result Date: 04/30/2017 CLINICAL DATA:  Bradycardia EXAM: PORTABLE CHEST 1 VIEW COMPARISON:  04/17/2016 FINDINGS: Cardiomegaly and chronic aortic  tortuosity. There is no edema, consolidation, effusion, or pneumothorax. IMPRESSION: 1. No acute finding. 2. Cardiomegaly. Electronically Signed   By: Monte Fantasia M.D.   On: 04/30/2017 13:59    Patient Profile     83 y.o. female with symptomatic bradycardia on diltiazem (severe sinus brady versus 2nd degree SA block with junctional escape rhythm), improved off medication.  Assessment & Plan    1. SA block - almost resolved after DC of diltiazem. No symptom free. Can probably avoid PPM implantation at least for near term. 2. HTN - it seems that her BP is normal off meds. If antiHTNive meds need to be restarted, avoid beta blockers, diltiazem, verapamil or clonidine.  Signed, Sanda Klein, MD  05/01/2017, 12:18 PM

## 2017-05-02 DIAGNOSIS — M150 Primary generalized (osteo)arthritis: Secondary | ICD-10-CM

## 2017-05-02 DIAGNOSIS — M549 Dorsalgia, unspecified: Secondary | ICD-10-CM

## 2017-05-02 DIAGNOSIS — G629 Polyneuropathy, unspecified: Secondary | ICD-10-CM

## 2017-05-02 DIAGNOSIS — G8929 Other chronic pain: Secondary | ICD-10-CM

## 2017-05-02 LAB — URINALYSIS, ROUTINE W REFLEX MICROSCOPIC
BILIRUBIN URINE: NEGATIVE
Glucose, UA: NEGATIVE mg/dL
Ketones, ur: NEGATIVE mg/dL
NITRITE: POSITIVE — AB
PH: 6 (ref 5.0–8.0)
Protein, ur: NEGATIVE mg/dL
SPECIFIC GRAVITY, URINE: 1.006 (ref 1.005–1.030)

## 2017-05-02 LAB — CBC
HEMATOCRIT: 34.4 % — AB (ref 36.0–46.0)
HEMOGLOBIN: 11.2 g/dL — AB (ref 12.0–15.0)
MCH: 28.6 pg (ref 26.0–34.0)
MCHC: 32.6 g/dL (ref 30.0–36.0)
MCV: 87.8 fL (ref 78.0–100.0)
Platelets: 178 10*3/uL (ref 150–400)
RBC: 3.92 MIL/uL (ref 3.87–5.11)
RDW: 13.6 % (ref 11.5–15.5)
WBC: 4 10*3/uL (ref 4.0–10.5)

## 2017-05-02 LAB — BASIC METABOLIC PANEL
ANION GAP: 6 (ref 5–15)
BUN: 20 mg/dL (ref 6–20)
CALCIUM: 8.5 mg/dL — AB (ref 8.9–10.3)
CHLORIDE: 108 mmol/L (ref 101–111)
CO2: 25 mmol/L (ref 22–32)
CREATININE: 0.99 mg/dL (ref 0.44–1.00)
GFR calc non Af Amer: 52 mL/min — ABNORMAL LOW (ref >60)
GLUCOSE: 99 mg/dL (ref 65–99)
Potassium: 3.8 mmol/L (ref 3.5–5.1)
Sodium: 139 mmol/L (ref 135–145)

## 2017-05-02 LAB — RPR: RPR: REACTIVE — AB

## 2017-05-02 LAB — RPR, QUANT+TP ABS (REFLEX)
Rapid Plasma Reagin, Quant: 1:1 {titer} — ABNORMAL HIGH
T Pallidum Abs: POSITIVE — AB

## 2017-05-02 LAB — VITAMIN D 25 HYDROXY (VIT D DEFICIENCY, FRACTURES): VIT D 25 HYDROXY: 17.4 ng/mL — AB (ref 30.0–100.0)

## 2017-05-02 MED ORDER — VITAMIN D (ERGOCALCIFEROL) 1.25 MG (50000 UNIT) PO CAPS: 50000.0000 [IU] | ORAL_CAPSULE | ORAL | 0 refills | Status: DC

## 2017-05-02 MED ORDER — CEPHALEXIN 500 MG PO CAPS: 500.0000 mg | ORAL_CAPSULE | Freq: Two times a day (BID) | ORAL | 0 refills | Status: AC

## 2017-05-02 MED ORDER — CEPHALEXIN 500 MG PO CAPS
500.0000 mg | ORAL_CAPSULE | Freq: Two times a day (BID) | ORAL | Status: DC
  Administered 2017-05-02: 500 mg via ORAL
  Filled 2017-05-02: qty 1

## 2017-05-02 MED ORDER — VITAMIN D (ERGOCALCIFEROL) 1.25 MG (50000 UNIT) PO CAPS
50000.0000 [IU] | ORAL_CAPSULE | ORAL | Status: DC
  Administered 2017-05-02: 50000 [IU] via ORAL
  Filled 2017-05-02: qty 1

## 2017-05-02 MED ORDER — HYDROCODONE-ACETAMINOPHEN 10-325 MG PO TABS: 1.0000 | ORAL_TABLET | Freq: Three times a day (TID) | ORAL | 0 refills | Status: DC

## 2017-05-02 NOTE — Progress Notes (Signed)
FPTS Interim Progress Note:   Patient's urine with nitrites, leukocytes and rare bacteria. Given history of recurrent UTIs, will empirically treat. Last urine culture was multiple species, last prior was semi-resistant E. Coli susceptible to Keflex. Will rx Keflex x 7 days plus resumption of Premarin cream vaginally and recommend close follow up of urine cultures at hospital follow up appointment 5/21 should antibiotics need to be altered.   Ralene Ok, MD

## 2017-05-02 NOTE — Progress Notes (Signed)
Family Medicine Teaching Service Daily Progress Note Intern Pager: [phone]  Patient name: Nicole Perez Medical record number: [id number] Date of birth: [birth year] Age: 83 y.o. Gender: female  Primary Care Provider: McDiarmid, Blane Ohara, MD Consultants: cardiology Code Status: DNR  Pt Overview and Major Events to Date:  5/15 admitted with bradycardia and bilateral LE numbness 5/16 MRI with significant stenosis, ortho (follows with Dr. Lynann Bologna) consulted  Assessment and Plan: Nicole Perez is a 83 y.o. female presenting with dizziness, bradycardia, and bilateral lower extremity numbness. PMH is significant for HLD, insomnia, HTN, OA, chronic back pain, spinal stenosis of thoracic and lumbar region, vit D deficiency, neurogenic bladder, peripheral neuropathy, anemia (iron deficiency).   Dizziness and bradycardia: patient with history of sinus arrhythmia/bradycardia based on her EKG from 04/22/2016. She was evaluated by cardiology in the setting of acute ST depression during active GI bleed during 04/2016. Outpatient follow up with cards 06/14/16 - recommended Myoview, patient did not complete this. Repeat EKG with ectopic atrial rhythm and ventricular trigeminy.  -cardiology consulted from ED, appreciate recs: hold dilt -cardiac monitoring -Hold Diltiazem   Bilateral lower extremity numbness: history of evaluation by [facility] who found severe spinal stenosis on MRI in 2014. Patient elected to forego surgery. Has history of back surgery 30 years ago. Neuro exam normal. She has 4+/5 motor strength in right LE (chronic) and 5/5 in left LE. Light sensation, patelar reflex and proprioception intact. Vitamin B12 normal. TSH 2.5. Dr. Lynann Bologna evaluated and recommended continued conservative management.  -RPR pending -vit D low: 50,000 U q7 days  Dysuria: patient reports some burning around her urethra. Self caths due to neurogenic bladder. History of extensive UTIs.  -UA -if UA negative,  resume previously used premarin. Discussed with patient that vaginal tissue is likely atrophic and irritated 2/2 caths  Chronic back pain/OA of multiple joints: severe spinal stenosis.  -increase norco to have 0.5-1 tablet mid day  History of GI bleed: no evidence of bleeding.  -daily CBC  HTN: history of HTN. On diltiazem at home.  -hold diltiazem given bradycardia  History of Urinary retention/cystocele: I&O self-cath at home -I&O cath as needed  Chronic constipation:  -Continue home MOM  FEN/GI: heart healthy  Prophylaxis: heparin subq  Disposition: continued inpatient management of bradycardia and numbness  Subjective:  Patient feels well this morning, one of her other daughters is present. Discussed vitamin D supplementation. Patient reports some burning on urination. Will obtain UA and discussed resuming premarin if UA reassuring.   Objective: Temp:  [97.8 F (36.6 C)-98.2 F (36.8 C)] 97.8 F (36.6 C) (05/17 0605) Pulse Rate:  [66-72] 66 (05/17 0605) Resp:  [18] 18 (05/17 0605) BP: (133-158)/(73-78) 158/78 (05/17 0605) SpO2:  [96 %-100 %] 96 % (05/17 6734) Physical Exam: General: Well appearing elderly female resting in bed inNAD.  Cardiovascular: bradycardia, regular rhythm, no murmur Respiratory: CTAB, easy WOB Abdomen: SNTND, +BS  Extremities: no edema, warm  Laboratory:  Recent Labs Lab 04/30/17 1224 05/01/17 0543 05/02/17 0248  WBC 4.8 4.5 4.0  HGB 12.1 11.3* 11.2*  HCT 37.7 35.4* 34.4*  PLT 204 175 178    Recent Labs Lab 04/30/17 1224 05/01/17 0543 05/02/17 0248  NA 138 140 139  K 4.0 3.9 3.8  CL 107 107 108  CO2 22 23 25   BUN 19 20 20   CREATININE 0.92 0.94 0.99  CALCIUM 9.0 8.7* 8.5*  GLUCOSE 110* 89 99   TSH 2.5 B12 290  Imaging/Diagnostic Tests: Mr Thoracic  Spine Wo Contrast  Result Date: 04/30/2017 CLINICAL DATA:  Initial evaluation for lower extremity numbness. EXAM: MRI THORACIC AND LUMBAR SPINE WITHOUT CONTRAST  TECHNIQUE: Multiplanar and multiecho pulse sequences of the thoracic and lumbar spine were obtained without intravenous contrast. COMPARISON:  Prior MRI from 11/17/2013. FINDINGS: MRI THORACIC SPINE FINDINGS Alignment: Trace anterolisthesis of T2 on T3. Vertebral bodies otherwise normally aligned with preservation of the normal thoracic kyphosis. Vertebrae: Vertebral body heights are maintained. No evidence for acute, subacute, or chronic fracture. Reactive endplate changes present about the T7-8 and T12-L1 interspaces. Prominent hemangioma is noted at the T11 and T12 levels. No worrisome osseous lesions. Signal intensity within the vertebral body bone marrow otherwise within normal limits. Cord:  Signal intensity within the thoracic spinal cord is normal. Paraspinal and other soft tissues: Paraspinous soft tissues demonstrate no acute abnormality. Partially visualized lungs are grossly clear. Visualized visceral structures grossly unremarkable. Disc levels: Multilevel degenerate spondylolysis noted within the partially visualized cervical spine on counter sequence, not well evaluated on this exam. T1-2: Diffuse degenerative disc bulge with intervertebral disc space narrowing. Facet and ligamentum flavum hypertrophy. Mild spinal stenosis. T2-3: Trace anterolisthesis of T2 on T3. Diffuse disc bulge with disc desiccation. Posterior element hypertrophy. Resultant mild spinal stenosis. T3-4: Minimal disc bulge. Posterior element hypertrophy. No significant stenosis. T4-5: Mild disc bulge. Posterior element hypertrophy. No significant stenosis. T5-6: Minimal disc bulge with posterior element hypertrophy. No stenosis. T6-7: Diffuse disc bulge with superimposed right paracentral disc protrusion (series 7, image 20). Protruding disc abuts the ventral thoracic cord with flattening of the right hemi cord. Posterior element hypertrophy with secondary mild flattening of the posterior aspect of the thoracic spinal cord as well.  No cord signal changes. Resultant moderate canal stenosis. T7-8: Diffuse degenerative disc bulge largely effaces the ventral thecal sac. Posterior line approach of the. Prominent reactive endplate changes. Mild canal stenosis. T8-9: Mild disc bulge with posterior left protrudes the. No stenosis. T9-10: Diffuse degenerative disc bulge with superimposed small left paracentral disc protrusion (series 7, image 29). Protruding disc abuts the left ventral aspect of the thoracic spinal cord. Mild cord flattening without cord signal changes. Posterior element hypertrophy. Resultant mild canal stenosis. T10-11: Diffuse degenerative disc bulge with intervertebral disc space narrowing. Posterior element hypertrophy. Resultant moderate canal stenosis. Perineural nerve root sleeve cyst noted on the left at this level. Moderate bilateral foraminal narrowing. T11-12: Diffuse degenerative disc bulge with disc desiccation and intervertebral disc space narrowing. Prominent bilateral facet arthrosis. Resultant moderate canal stenosis. Moderate to advanced right with moderate left foraminal stenosis. T12-L1: Diffuse degenerative disc bulge with disc desiccation and intervertebral disc space narrowing. Reactive endplate changes. Advanced bilateral facet arthrosis, slightly worse on the right. Resultant severe canal stenosis. Thecal sac measures approximately 4 mm in AP diameter (series 7, image 38). Fairly severe bilateral foraminal narrowing present as well, slightly worse on the left. MRI LUMBAR SPINE FINDINGS Segmentation: Normal segmentation. Lowest well-formed disc is labeled the L5-S1 level. Alignment: 8 mm anterolisthesis of L4 on L5. Trace 2 mm anterolisthesis of L3 on L4. Mild dextroscoliosis. Vertebral bodies otherwise normally aligned with preservation of the normal lumbar lordosis. Vertebrae: Vertebral body heights maintained without evidence for acute or chronic fracture. Prominent reactive endplate changes noted about the  T12-L1, L2-3, L4-5, and L5-S1 interspaces. Scattered benign hemangiomas noted. No worrisome osseous lesions. Conus medullaris: Extends to the L1-2 level and appears normal. Paraspinal and other soft tissues: Paraspinous soft tissues demonstrate no acute abnormality. Oblong cystic lesion within the  left iliacus muscle noted, of doubtful clinical significance. Visualized visceral structures within normal limits. 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 L1-2: Chronic diffuse degenerative disc bulge with disc desiccation and intervertebral disc space narrowing. Advanced bilateral facet arthrosis. Resultant severe canal and bilateral subarticular stenosis. Thecal sac measures approximately 5 mm in AP diameter. Moderate left with mild right foraminal stenosis. L2-3: Chronic diffuse degenerative intervertebral disc space narrowing with disc desiccation and intervertebral disc space narrowing. Superimposed small central disc protrusion at noted (series 14, image 10). Advanced bilateral facet arthrosis. Resultant severe canal and bilateral subarticular stenosis with the thecal sac measuring 4 mm in AP diameter. Mild to moderate bilateral foraminal stenosis. L3-4: Trace anterolisthesis of L3 on L4. Diffuse disc bulge with disc desiccation. Advanced facet arthrosis with ligamentum flavum hypertrophy. Resultant severe canal and bilateral subarticular stenosis. Thecal sac is essentially effaced, measuring approximately 3-4 mm in AP diameter (series 14, image 15). Mild to moderate bilateral foraminal stenosis, slightly worse on the left. L4-5: 8 mm anterolisthesis of L4 on L5. Associated broad posterior pseudo disc bulge, eccentric to the left. Prominent reactive endplate changes. Advanced bilateral facet arthrosis, right slightly worse than left. Patient appears to be status post remote posterior decompression at this level. Persistent fairly severe canal  and bilateral subarticular stenosis, worse on the left. Thecal sac measures approximately 5 mm in AP diameter. Severe bilateral foraminal stenosis. L5-S1: Chronic diffuse intervertebral disc space narrowing with diffuse disc bulge, disc desiccation, and reactive endplate changes. Moderate bilateral facet arthrosis. Patient appears to be status post remote posterior decompression. Resultant moderate canal stenosis, with mild to moderate bilateral foraminal narrowing. IMPRESSION: MR THORACIC SPINE IMPRESSION Advanced multilevel degenerative disc disease and facet arthrosis as detailed above. Resultant multifocal canal stenosis, severe at the T12-L1 level, more moderate nature at T6-7, T10-11, and T11-12. Please see above report for a full description of these findings. MR LUMBAR SPINE IMPRESSION 1. 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 There is severe canal narrowing at essentially all levels extending from L1-2 through L4-5, and is most severe at the L3-4 level where the thecal sac is essentially effaced. Neuro surgical consultation recommended. 2. 8 mm anterolisthesis of L4 on L5 with associated disc bulge and advanced facet arthropathy, with resultant severe bilateral L4 foraminal stenosis. 3. Sequelae of remote posterior decompression at L4-5 and L5-S1. Electronically Signed   By: Jeannine Boga M.D.   On: 04/30/2017 20:15   Mr Lumbar Spine Wo Contrast  Result Date: 04/30/2017 CLINICAL DATA:  Initial evaluation for lower extremity numbness. EXAM: MRI THORACIC AND LUMBAR SPINE WITHOUT CONTRAST TECHNIQUE: Multiplanar and multiecho pulse sequences of the thoracic and lumbar spine were obtained without intravenous contrast. COMPARISON:  Prior MRI from 11/17/2013. FINDINGS: MRI THORACIC SPINE FINDINGS Alignment: Trace anterolisthesis of T2 on T3. Vertebral bodies otherwise normally aligned with preservation of the  normal thoracic kyphosis. Vertebrae: Vertebral body heights are maintained. No evidence for acute, subacute, or chronic fracture. Reactive endplate changes present about the T7-8 and T12-L1 interspaces. Prominent hemangioma is noted at the T11 and T12 levels. No worrisome osseous lesions. Signal intensity within the vertebral body bone marrow otherwise within normal limits. Cord:  Signal intensity within the thoracic spinal cord is normal. Paraspinal and other soft tissues: Paraspinous soft tissues demonstrate no acute abnormality. Partially visualized lungs are grossly clear. Visualized visceral structures grossly unremarkable. Disc levels: Multilevel degenerate spondylolysis noted within  the partially visualized cervical spine on counter sequence, not well evaluated on this exam. T1-2: Diffuse degenerative disc bulge with intervertebral disc space narrowing. Facet and ligamentum flavum hypertrophy. Mild spinal stenosis. T2-3: Trace anterolisthesis of T2 on T3. Diffuse disc bulge with disc desiccation. Posterior element hypertrophy. Resultant mild spinal stenosis. T3-4: Minimal disc bulge. Posterior element hypertrophy. No significant stenosis. T4-5: Mild disc bulge. Posterior element hypertrophy. No significant stenosis. T5-6: Minimal disc bulge with posterior element hypertrophy. No stenosis. T6-7: Diffuse disc bulge with superimposed right paracentral disc protrusion (series 7, image 20). Protruding disc abuts the ventral thoracic cord with flattening of the right hemi cord. Posterior element hypertrophy with secondary mild flattening of the posterior aspect of the thoracic spinal cord as well. No cord signal changes. Resultant moderate canal stenosis. T7-8: Diffuse degenerative disc bulge largely effaces the ventral thecal sac. Posterior line approach of the. Prominent reactive endplate changes. Mild canal stenosis. T8-9: Mild disc bulge with posterior left protrudes the. No stenosis. T9-10: Diffuse  degenerative disc bulge with superimposed small left paracentral disc protrusion (series 7, image 29). Protruding disc abuts the left ventral aspect of the thoracic spinal cord. Mild cord flattening without cord signal changes. Posterior element hypertrophy. Resultant mild canal stenosis. T10-11: Diffuse degenerative disc bulge with intervertebral disc space narrowing. Posterior element hypertrophy. Resultant moderate canal stenosis. Perineural nerve root sleeve cyst noted on the left at this level. Moderate bilateral foraminal narrowing. T11-12: Diffuse degenerative disc bulge with disc desiccation and intervertebral disc space narrowing. Prominent bilateral facet arthrosis. Resultant moderate canal stenosis. Moderate to advanced right with moderate left foraminal stenosis. T12-L1: Diffuse degenerative disc bulge with disc desiccation and intervertebral disc space narrowing. Reactive endplate changes. Advanced bilateral facet arthrosis, slightly worse on the right. Resultant severe canal stenosis. Thecal sac measures approximately 4 mm in AP diameter (series 7, image 38). Fairly severe bilateral foraminal narrowing present as well, slightly worse on the left. MRI LUMBAR SPINE FINDINGS Segmentation: Normal segmentation. Lowest well-formed disc is labeled the L5-S1 level. Alignment: 8 mm anterolisthesis of L4 on L5. Trace 2 mm anterolisthesis of L3 on L4. Mild dextroscoliosis. Vertebral bodies otherwise normally aligned with preservation of the normal lumbar lordosis. Vertebrae: Vertebral body heights maintained without evidence for acute or chronic fracture. Prominent reactive endplate changes noted about the T12-L1, L2-3, L4-5, and L5-S1 interspaces. Scattered benign hemangiomas noted. No worrisome osseous lesions. Conus medullaris: Extends to the L1-2 level and appears normal. Paraspinal and other soft tissues: Paraspinous soft tissues demonstrate no acute abnormality. Oblong cystic lesion within the left iliacus  muscle noted, of doubtful clinical significance. Visualized visceral structures within normal limits. 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 L1-2: Chronic diffuse degenerative disc bulge with disc desiccation and intervertebral disc space narrowing. Advanced bilateral facet arthrosis. Resultant severe canal and bilateral subarticular stenosis. Thecal sac measures approximately 5 mm in AP diameter. Moderate left with mild right foraminal stenosis. L2-3: Chronic diffuse degenerative intervertebral disc space narrowing with disc desiccation and intervertebral disc space narrowing. Superimposed small central disc protrusion at noted (series 14, image 10). Advanced bilateral facet arthrosis. Resultant severe canal and bilateral subarticular stenosis with the thecal sac measuring 4 mm in AP diameter. Mild to moderate bilateral foraminal stenosis. L3-4: Trace anterolisthesis of L3 on L4. Diffuse disc bulge with disc desiccation. Advanced facet arthrosis with ligamentum flavum hypertrophy. Resultant severe canal and bilateral subarticular stenosis. Thecal sac is essentially effaced, measuring approximately 3-4 mm in AP diameter (  series 14, image 15). Mild to moderate bilateral foraminal stenosis, slightly worse on the left. L4-5: 8 mm anterolisthesis of L4 on L5. Associated broad posterior pseudo disc bulge, eccentric to the left. Prominent reactive endplate changes. Advanced bilateral facet arthrosis, right slightly worse than left. Patient appears to be status post remote posterior decompression at this level. Persistent fairly severe canal and bilateral subarticular stenosis, worse on the left. Thecal sac measures approximately 5 mm in AP diameter. Severe bilateral foraminal stenosis. L5-S1: Chronic diffuse intervertebral disc space narrowing with diffuse disc bulge, disc desiccation, and reactive endplate changes. Moderate bilateral facet  arthrosis. Patient appears to be status post remote posterior decompression. Resultant moderate canal stenosis, with mild to moderate bilateral foraminal narrowing. IMPRESSION: MR THORACIC SPINE IMPRESSION Advanced multilevel degenerative disc disease and facet arthrosis as detailed above. Resultant multifocal canal stenosis, severe at the T12-L1 level, more moderate nature at T6-7, T10-11, and T11-12. Please see above report for a full description of these findings. MR LUMBAR SPINE IMPRESSION 1. 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 There is severe canal narrowing at essentially all levels extending from L1-2 through L4-5, and is most severe at the L3-4 level where the thecal sac is essentially effaced. Neuro surgical consultation recommended. 2. 8 mm anterolisthesis of L4 on L5 with associated disc bulge and advanced facet arthropathy, with resultant severe bilateral L4 foraminal stenosis. 3. Sequelae of remote posterior decompression at L4-5 and L5-S1. Electronically Signed   By: Jeannine Boga M.D.   On: 04/30/2017 20:15   Dg Chest Portable 1 View  Result Date: 04/30/2017 CLINICAL DATA:  Bradycardia EXAM: PORTABLE CHEST 1 VIEW COMPARISON:  04/17/2016 FINDINGS: Cardiomegaly and chronic aortic tortuosity. There is no edema, consolidation, effusion, or pneumothorax. IMPRESSION: 1. No acute finding. 2. Cardiomegaly. Electronically Signed   By: Monte Fantasia M.D.   On: 04/30/2017 13:59     Sela Hilding, MD 05/02/2017, 7:13 AM PGY-1, [facility] pager: [phone], text pages welcome

## 2017-05-02 NOTE — Discharge Instructions (Signed)
It has been a pleasure taking care of you! You were admitted due to numbness in your legs and dizziness. MRI of her back showed narrowing of the spinal canal which could be contributing to numbness in your legs. You elected to forego surgery and manage this medically with pain medicines. About 2 dizziness, we discontinued one of your medication which could be contributing to this. There could be some other changes made to your home medications during this hospitalization. Please, make sure to read the directions before you take them. The names and directions on how to take these medications are found on this discharge paper under medication section.  Please follow up at your primary care doctor's office. The address, date and time are found on the discharge paper under follow up section.  Take care,

## 2017-05-02 NOTE — Progress Notes (Signed)
[place] to be D/C'd Home per MD order. Discussed with the patient and all questions fully answered.    VVS, Skin clean, dry and intact without evidence of skin break down, no evidence of skin tears noted.  IV catheter discontinued intact. Site without signs and symptoms of complications. Dressing and pressure applied.  An After Visit Summary was printed and given to the patient.  Patient escorted via [place], and D/C home via private auto.  Cyndra Numbers  05/02/2017 6:49 PM

## 2017-05-02 NOTE — Progress Notes (Signed)
Progress Note  Patient Name: Nicole Perez Date of Encounter: 05/02/2017  Primary Cardiologist: Hilty  Subjective   Feels well - no fatigue and no presyncope. HR slower at night with mild pauses due to SA block, 2nd deg MT1. During day, no bradycardia. Her daughter describes repeated episodes of witnessed apnea during sleep. She snores. She often "wakes herself up" with loud breathing.  Inpatient Medications    Scheduled Meds: . heparin  5,000 Units Subcutaneous Q8H  . Vitamin D (Ergocalciferol)  50,000 Units Oral Q7 days   Continuous Infusions:  PRN Meds: HYDROcodone-acetaminophen, magnesium hydroxide, polyethylene glycol   Vital Signs    Vitals:   05/01/17 0500 05/01/17 1353 05/01/17 2200 05/02/17 0605  BP: (!) 136/49 (!) 142/73 133/76 (!) 158/78  Pulse: (!) 39 67 72 66  Resp: 18 18 18 18   Temp: 98.4 F (36.9 C) 98.2 F (36.8 C) 98.1 F (36.7 C) 97.8 F (36.6 C)  TempSrc: Oral Oral Oral Oral  SpO2: 100% 100% 99% 96%  Weight:      Height:        Intake/Output Summary (Last 24 hours) at 05/02/17 0911 Last data filed at 05/01/17 2250  Gross per 24 hour  Intake              240 ml  Output              550 ml  Net             -310 ml   Filed Weights   04/30/17 1219 04/30/17 2011  Weight: 155 lb (70.3 kg) 154 lb 3.2 oz (69.9 kg)    Telemetry    Sinus rhythm with frequent episodes of second degree SA block, Mobitz type I. Occasional pauses mostly at night, max 2.4 seconds. Moderate bradycardia at night to high 30s. During waking hours HR is 70-80 - Personally Reviewed  ECG    No new tracing - Personally Reviewed  Physical Exam  Smiling and relaxed. GEN: No acute distress. Nasal bridge deformity   Neck: No JVD Cardiac: RRR, no murmurs, rubs, or gallops.  Respiratory: Clear to auscultation bilaterally. GI: Soft, nontender, non-distended  MS: No edema; No deformity. Neuro:  Nonfocal  Psych: Normal affect   Labs    Chemistry Recent Labs Lab  04/30/17 1224 05/01/17 0543 05/02/17 0248  NA 138 140 139  K 4.0 3.9 3.8  CL 107 107 108  CO2 22 23 25   GLUCOSE 110* 89 99  BUN 19 20 20   CREATININE 0.92 0.94 0.99  CALCIUM 9.0 8.7* 8.5*  GFRNONAA 57* 55* 52*  GFRAA >60 >60 >60  ANIONGAP 9 10 6      Hematology Recent Labs Lab 04/30/17 1224 05/01/17 0543 05/02/17 0248  WBC 4.8 4.5 4.0  RBC 4.30 4.02 3.92  HGB 12.1 11.3* 11.2*  HCT 37.7 35.4* 34.4*  MCV 87.7 88.1 87.8  MCH 28.1 28.1 28.6  MCHC 32.1 31.9 32.6  RDW 13.6 13.6 13.6  PLT 204 175 178    Cardiac Enzymes Recent Labs Lab 04/30/17 1817 05/01/17 0001 05/01/17 0543  TROPONINI <0.03 <0.03 <0.03    Recent Labs Lab 04/30/17 1238  TROPIPOC 0.01     BNPNo results for input(s): BNP, PROBNP in the last 168 hours.   DDimer No results for input(s): DDIMER in the last 168 hours.   Radiology    Mr Thoracic Spine Wo Contrast  Result Date: 04/30/2017 CLINICAL DATA:  Initial evaluation for lower extremity numbness. EXAM: MRI  THORACIC AND LUMBAR SPINE WITHOUT CONTRAST TECHNIQUE: Multiplanar and multiecho pulse sequences of the thoracic and lumbar spine were obtained without intravenous contrast. COMPARISON:  Prior MRI from 11/17/2013. FINDINGS: MRI THORACIC SPINE FINDINGS Alignment: Trace anterolisthesis of T2 on T3. Vertebral bodies otherwise normally aligned with preservation of the normal thoracic kyphosis. Vertebrae: Vertebral body heights are maintained. No evidence for acute, subacute, or chronic fracture. Reactive endplate changes present about the T7-8 and T12-L1 interspaces. Prominent hemangioma is noted at the T11 and T12 levels. No worrisome osseous lesions. Signal intensity within the vertebral body bone marrow otherwise within normal limits. Cord:  Signal intensity within the thoracic spinal cord is normal. Paraspinal and other soft tissues: Paraspinous soft tissues demonstrate no acute abnormality. Partially visualized lungs are grossly clear. Visualized  visceral structures grossly unremarkable. Disc levels: Multilevel degenerate spondylolysis noted within the partially visualized cervical spine on counter sequence, not well evaluated on this exam. T1-2: Diffuse degenerative disc bulge with intervertebral disc space narrowing. Facet and ligamentum flavum hypertrophy. Mild spinal stenosis. T2-3: Trace anterolisthesis of T2 on T3. Diffuse disc bulge with disc desiccation. Posterior element hypertrophy. Resultant mild spinal stenosis. T3-4: Minimal disc bulge. Posterior element hypertrophy. No significant stenosis. T4-5: Mild disc bulge. Posterior element hypertrophy. No significant stenosis. T5-6: Minimal disc bulge with posterior element hypertrophy. No stenosis. T6-7: Diffuse disc bulge with superimposed right paracentral disc protrusion (series 7, image 20). Protruding disc abuts the ventral thoracic cord with flattening of the right hemi cord. Posterior element hypertrophy with secondary mild flattening of the posterior aspect of the thoracic spinal cord as well. No cord signal changes. Resultant moderate canal stenosis. T7-8: Diffuse degenerative disc bulge largely effaces the ventral thecal sac. Posterior line approach of the. Prominent reactive endplate changes. Mild canal stenosis. T8-9: Mild disc bulge with posterior left protrudes the. No stenosis. T9-10: Diffuse degenerative disc bulge with superimposed small left paracentral disc protrusion (series 7, image 29). Protruding disc abuts the left ventral aspect of the thoracic spinal cord. Mild cord flattening without cord signal changes. Posterior element hypertrophy. Resultant mild canal stenosis. T10-11: Diffuse degenerative disc bulge with intervertebral disc space narrowing. Posterior element hypertrophy. Resultant moderate canal stenosis. Perineural nerve root sleeve cyst noted on the left at this level. Moderate bilateral foraminal narrowing. T11-12: Diffuse degenerative disc bulge with disc desiccation  and intervertebral disc space narrowing. Prominent bilateral facet arthrosis. Resultant moderate canal stenosis. Moderate to advanced right with moderate left foraminal stenosis. T12-L1: Diffuse degenerative disc bulge with disc desiccation and intervertebral disc space narrowing. Reactive endplate changes. Advanced bilateral facet arthrosis, slightly worse on the right. Resultant severe canal stenosis. Thecal sac measures approximately 4 mm in AP diameter (series 7, image 38). Fairly severe bilateral foraminal narrowing present as well, slightly worse on the left. MRI LUMBAR SPINE FINDINGS Segmentation: Normal segmentation. Lowest well-formed disc is labeled the L5-S1 level. Alignment: 8 mm anterolisthesis of L4 on L5. Trace 2 mm anterolisthesis of L3 on L4. Mild dextroscoliosis. Vertebral bodies otherwise normally aligned with preservation of the normal lumbar lordosis. Vertebrae: Vertebral body heights maintained without evidence for acute or chronic fracture. Prominent reactive endplate changes noted about the T12-L1, L2-3, L4-5, and L5-S1 interspaces. Scattered benign hemangiomas noted. No worrisome osseous lesions. Conus medullaris: Extends to the L1-2 level and appears normal. Paraspinal and other soft tissues: Paraspinous soft tissues demonstrate no acute abnormality. Oblong cystic lesion within the left iliacus muscle noted, of doubtful clinical significance. Visualized visceral structures within normal limits. Disc levels: Underlying degree of  congenital spinal stenosis with congenital shortening of the pedicles suspected. L1-2: Chronic diffuse degenerative disc bulge with disc desiccation and intervertebral disc space narrowing. Advanced bilateral facet arthrosis. Resultant severe canal and bilateral subarticular stenosis. Thecal sac measures approximately 5 mm in AP diameter. Moderate left with mild right foraminal stenosis. L2-3: Chronic diffuse degenerative intervertebral disc space narrowing with disc  desiccation and intervertebral disc space narrowing. Superimposed small central disc protrusion at noted (series 14, image 10). Advanced bilateral facet arthrosis. Resultant severe canal and bilateral subarticular stenosis with the thecal sac measuring 4 mm in AP diameter. Mild to moderate bilateral foraminal stenosis. L3-4: Trace anterolisthesis of L3 on L4. Diffuse disc bulge with disc desiccation. Advanced facet arthrosis with ligamentum flavum hypertrophy. Resultant severe canal and bilateral subarticular stenosis. Thecal sac is essentially effaced, measuring approximately 3-4 mm in AP diameter (series 14, image 15). Mild to moderate bilateral foraminal stenosis, slightly worse on the left. L4-5: 8 mm anterolisthesis of L4 on L5. Associated broad posterior pseudo disc bulge, eccentric to the left. Prominent reactive endplate changes. Advanced bilateral facet arthrosis, right slightly worse than left. Patient appears to be status post remote posterior decompression at this level. Persistent fairly severe canal and bilateral subarticular stenosis, worse on the left. Thecal sac measures approximately 5 mm in AP diameter. Severe bilateral foraminal stenosis. L5-S1: Chronic diffuse intervertebral disc space narrowing with diffuse disc bulge, disc desiccation, and reactive endplate changes. Moderate bilateral facet arthrosis. Patient appears to be status post remote posterior decompression. Resultant moderate canal stenosis, with mild to moderate bilateral foraminal narrowing. IMPRESSION: MR THORACIC SPINE IMPRESSION Advanced multilevel degenerative disc disease and facet arthrosis as detailed above. Resultant multifocal canal stenosis, severe at the T12-L1 level, more moderate nature at T6-7, T10-11, and T11-12. Please see above report for a full description of these findings. MR LUMBAR SPINE IMPRESSION 1. Advanced multilevel degenerative spondylolysis and facet arthrosis with resultant severe multilevel canal  stenosis as detailed above. There is severe canal narrowing at essentially all levels extending from L1-2 through L4-5, and is most severe at the L3-4 level where the thecal sac is essentially effaced. Neuro surgical consultation recommended. 2. 8 mm anterolisthesis of L4 on L5 with associated disc bulge and advanced facet arthropathy, with resultant severe bilateral L4 foraminal stenosis. 3. Sequelae of remote posterior decompression at L4-5 and L5-S1. Electronically Signed   By: Jeannine Boga M.D.   On: 04/30/2017 20:15   Mr Lumbar Spine Wo Contrast  Result Date: 04/30/2017 CLINICAL DATA:  Initial evaluation for lower extremity numbness. EXAM: MRI THORACIC AND LUMBAR SPINE WITHOUT CONTRAST TECHNIQUE: Multiplanar and multiecho pulse sequences of the thoracic and lumbar spine were obtained without intravenous contrast. COMPARISON:  Prior MRI from 11/17/2013. FINDINGS: MRI THORACIC SPINE FINDINGS Alignment: Trace anterolisthesis of T2 on T3. Vertebral bodies otherwise normally aligned with preservation of the normal thoracic kyphosis. Vertebrae: Vertebral body heights are maintained. No evidence for acute, subacute, or chronic fracture. Reactive endplate changes present about the T7-8 and T12-L1 interspaces. Prominent hemangioma is noted at the T11 and T12 levels. No worrisome osseous lesions. Signal intensity within the vertebral body bone marrow otherwise within normal limits. Cord:  Signal intensity within the thoracic spinal cord is normal. Paraspinal and other soft tissues: Paraspinous soft tissues demonstrate no acute abnormality. Partially visualized lungs are grossly clear. Visualized visceral structures grossly unremarkable. Disc levels: Multilevel degenerate spondylolysis noted within the partially visualized cervical spine on counter sequence, not well evaluated on this exam. T1-2: Diffuse degenerative disc bulge  with intervertebral disc space narrowing. Facet and ligamentum flavum hypertrophy.  Mild spinal stenosis. T2-3: Trace anterolisthesis of T2 on T3. Diffuse disc bulge with disc desiccation. Posterior element hypertrophy. Resultant mild spinal stenosis. T3-4: Minimal disc bulge. Posterior element hypertrophy. No significant stenosis. T4-5: Mild disc bulge. Posterior element hypertrophy. No significant stenosis. T5-6: Minimal disc bulge with posterior element hypertrophy. No stenosis. T6-7: Diffuse disc bulge with superimposed right paracentral disc protrusion (series 7, image 20). Protruding disc abuts the ventral thoracic cord with flattening of the right hemi cord. Posterior element hypertrophy with secondary mild flattening of the posterior aspect of the thoracic spinal cord as well. No cord signal changes. Resultant moderate canal stenosis. T7-8: Diffuse degenerative disc bulge largely effaces the ventral thecal sac. Posterior line approach of the. Prominent reactive endplate changes. Mild canal stenosis. T8-9: Mild disc bulge with posterior left protrudes the. No stenosis. T9-10: Diffuse degenerative disc bulge with superimposed small left paracentral disc protrusion (series 7, image 29). Protruding disc abuts the left ventral aspect of the thoracic spinal cord. Mild cord flattening without cord signal changes. Posterior element hypertrophy. Resultant mild canal stenosis. T10-11: Diffuse degenerative disc bulge with intervertebral disc space narrowing. Posterior element hypertrophy. Resultant moderate canal stenosis. Perineural nerve root sleeve cyst noted on the left at this level. Moderate bilateral foraminal narrowing. T11-12: Diffuse degenerative disc bulge with disc desiccation and intervertebral disc space narrowing. Prominent bilateral facet arthrosis. Resultant moderate canal stenosis. Moderate to advanced right with moderate left foraminal stenosis. T12-L1: Diffuse degenerative disc bulge with disc desiccation and intervertebral disc space narrowing. Reactive endplate changes. Advanced  bilateral facet arthrosis, slightly worse on the right. Resultant severe canal stenosis. Thecal sac measures approximately 4 mm in AP diameter (series 7, image 38). Fairly severe bilateral foraminal narrowing present as well, slightly worse on the left. MRI LUMBAR SPINE FINDINGS Segmentation: Normal segmentation. Lowest well-formed disc is labeled the L5-S1 level. Alignment: 8 mm anterolisthesis of L4 on L5. Trace 2 mm anterolisthesis of L3 on L4. Mild dextroscoliosis. Vertebral bodies otherwise normally aligned with preservation of the normal lumbar lordosis. Vertebrae: Vertebral body heights maintained without evidence for acute or chronic fracture. Prominent reactive endplate changes noted about the T12-L1, L2-3, L4-5, and L5-S1 interspaces. Scattered benign hemangiomas noted. No worrisome osseous lesions. Conus medullaris: Extends to the L1-2 level and appears normal. Paraspinal and other soft tissues: Paraspinous soft tissues demonstrate no acute abnormality. Oblong cystic lesion within the left iliacus muscle noted, of doubtful clinical significance. Visualized visceral structures within normal limits. 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 L1-2: Chronic diffuse degenerative disc bulge with disc desiccation and intervertebral disc space narrowing. Advanced bilateral facet arthrosis. Resultant severe canal and bilateral subarticular stenosis. Thecal sac measures approximately 5 mm in AP diameter. Moderate left with mild right foraminal stenosis. L2-3: Chronic diffuse degenerative intervertebral disc space narrowing with disc desiccation and intervertebral disc space narrowing. Superimposed small central disc protrusion at noted (series 14, image 10). Advanced bilateral facet arthrosis. Resultant severe canal and bilateral subarticular stenosis with the thecal sac measuring 4 mm in AP diameter. Mild to moderate bilateral foraminal  stenosis. L3-4: Trace anterolisthesis of L3 on L4. Diffuse disc bulge with disc desiccation. Advanced facet arthrosis with ligamentum flavum hypertrophy. Resultant severe canal and bilateral subarticular stenosis. Thecal sac is essentially effaced, measuring approximately 3-4 mm in AP diameter (series 14, image 15). Mild to moderate bilateral foraminal stenosis, slightly worse on the left. L4-5: 8 mm  anterolisthesis of L4 on L5. Associated broad posterior pseudo disc bulge, eccentric to the left. Prominent reactive endplate changes. Advanced bilateral facet arthrosis, right slightly worse than left. Patient appears to be status post remote posterior decompression at this level. Persistent fairly severe canal and bilateral subarticular stenosis, worse on the left. Thecal sac measures approximately 5 mm in AP diameter. Severe bilateral foraminal stenosis. L5-S1: Chronic diffuse intervertebral disc space narrowing with diffuse disc bulge, disc desiccation, and reactive endplate changes. Moderate bilateral facet arthrosis. Patient appears to be status post remote posterior decompression. Resultant moderate canal stenosis, with mild to moderate bilateral foraminal narrowing. IMPRESSION: MR THORACIC SPINE IMPRESSION Advanced multilevel degenerative disc disease and facet arthrosis as detailed above. Resultant multifocal canal stenosis, severe at the T12-L1 level, more moderate nature at T6-7, T10-11, and T11-12. Please see above report for a full description of these findings. MR LUMBAR SPINE IMPRESSION 1. 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 There is severe canal narrowing at essentially all levels extending from L1-2 through L4-5, and is most severe at the L3-4 level where the thecal sac is essentially effaced. Neuro surgical consultation recommended. 2. 8 mm anterolisthesis of L4 on L5 with associated disc bulge and  advanced facet arthropathy, with resultant severe bilateral L4 foraminal stenosis. 3. Sequelae of remote posterior decompression at L4-5 and L5-S1. Electronically Signed   By: Jeannine Boga M.D.   On: 04/30/2017 20:15   Dg Chest Portable 1 View  Result Date: 04/30/2017 CLINICAL DATA:  Bradycardia EXAM: PORTABLE CHEST 1 VIEW COMPARISON:  04/17/2016 FINDINGS: Cardiomegaly and chronic aortic tortuosity. There is no edema, consolidation, effusion, or pneumothorax. IMPRESSION: 1. No acute finding. 2. Cardiomegaly. Electronically Signed   By: Monte Fantasia M.D.   On: 04/30/2017 13:59    Patient Profile     83 y.o. female with symptomatic bradycardia on diltiazem (severe sinus brady versus 2nd degree SA block with junctional escape rhythm), improved off medication.  Assessment & Plan    1. SA block - better after stopping diltiazem. Mostly occurring at night. Now asymptomatic. No pauses >3 seconds. No indication for PPM implantation (at least for near term). Will probably eventually need PPM as the years go by, if she becomes symptomatic. Avoid all negative chronotropic agents. 2. HTN - it seems that her BP is normal off meds. If antiHTNive meds need to be restarted, avoid beta blockers, diltiazem, verapamil or clonidine. 3. Probable sleep apnea - sounds like a pretty convincing description, although she denies daytime hypersomnolence. Recommend sleep study as an outpatient. 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 She would probably not do well with a conventional CPAP device, but may be able to use a full face mask or nasal pillows. ENT advice may be necessary.  Signed, Sanda Klein, MD  05/02/2017, 9:11 AM

## 2017-05-02 NOTE — Progress Notes (Signed)
Transitions of Care Pharmacy Note  Plan:  Educated on new keflex/vitamin D and discontinuation of diltiazem  Outpatient follow-up: heart rate & BP  --------------------------------------------- Nicole Perez is an 83 y.o. female who presents with a chief complaint of dizziness and bradycardia. In anticipation of discharge, pharmacy has reviewed this patient's prior to admission medication history, as well as current inpatient medications listed per the [facility].  Current medication indications, dosing, frequency, and notable side effects reviewed with patient and family. patient and family verbalized understanding of current inpatient medication regimen and is aware that the After Visit Summary when presented, will represent the most accurate medication list at discharge.   Nicole Perez expressed concerns regarding her new vitamin D prescription. Informed patient that she would receive a prescription for this medication. She is aware that she will be taking this once weekly.   Discussed discontinuation of patient's diltiazem. Encouraged patient to remove this medication from her home supply. Patient is also aware that she will need to complete her new antibiotic in its entirety.    Patient and family do not have any concerns or questions at the conclusion of our visit.   Assessment: Understanding of regimen: good Understanding of indications: good Potential of compliance: good Barriers to Obtaining Medications: No  Patient instructed to contact inpatient pharmacy team with further questions or concerns if needed.    Time spent preparing for discharge counseling: 10 min  Time spent counseling patient: 15 min    Nicole Perez, PharmD Pharmacy Resident  Pager [phone] 05/02/17 1:37 PM

## 2017-05-05 LAB — URINE CULTURE

## 2017-05-06 ENCOUNTER — Encounter: Payer: Self-pay | Admitting: Family Medicine

## 2017-05-06 ENCOUNTER — Ambulatory Visit (INDEPENDENT_AMBULATORY_CARE_PROVIDER_SITE_OTHER): Payer: Medicare Other | Admitting: Family Medicine

## 2017-05-06 VITALS — BP 174/82 | HR 52 | Temp 98.2°F | Ht 59.0 in | Wt 153.8 lb

## 2017-05-06 DIAGNOSIS — N3000 Acute cystitis without hematuria: Secondary | ICD-10-CM

## 2017-05-06 DIAGNOSIS — A530 Latent syphilis, unspecified as early or late: Secondary | ICD-10-CM

## 2017-05-06 DIAGNOSIS — I10 Essential (primary) hypertension: Secondary | ICD-10-CM | POA: Diagnosis not present

## 2017-05-06 DIAGNOSIS — M48061 Spinal stenosis, lumbar region without neurogenic claudication: Secondary | ICD-10-CM

## 2017-05-06 DIAGNOSIS — N319 Neuromuscular dysfunction of bladder, unspecified: Secondary | ICD-10-CM

## 2017-05-06 DIAGNOSIS — I441 Atrioventricular block, second degree: Secondary | ICD-10-CM

## 2017-05-06 MED ORDER — HYDROCHLOROTHIAZIDE 25 MG PO TABS: 25.0000 mg | ORAL_TABLET | Freq: Every day | ORAL | 2 refills | Status: DC

## 2017-05-06 MED ORDER — SULFAMETHOXAZOLE-TRIMETHOPRIM 800-160 MG PO TABS: 1.0000 | ORAL_TABLET | Freq: Two times a day (BID) | ORAL | 0 refills | Status: DC

## 2017-05-06 NOTE — Assessment & Plan Note (Addendum)
Uncontrolled since taken off home diltiazem for 2nd degree Mobitz Type 1 in the hospital. Asymptomatic. Will need to avoid BB, CCB, and clonidine in the future. - Start HCTZ 25mg  qd and follow up in 2-3weeks with PCP

## 2017-05-06 NOTE — Assessment & Plan Note (Signed)
Severe spinal stenosis that is unchanged. Doing well on pain control overall though some slight exacerbation with her UTI. Will treat UTI as below and then reassess pain at next visit. - Continue norco 10-325mg  1 tablet in the am, 1/2 tablet at lunchtime, 1 tablet in pm

## 2017-05-06 NOTE — Patient Instructions (Signed)
Thank you for coming in for your hospital follow up.  For your blood pressure, - Please start hydrochlorothiazide 25mg  daily  For your Urinary tract infection, - Keep taking the Keflex until you finish the course - We are adding antibiotic called Bactrim to treat the other bacteria.  Please follow up with Dr. McDiarmid in 2 weeks for your blood pressure  We are checking some labs today, and someone will call you or send you a letter with the results when they are available.   Take care and seek immediate care sooner if you develop any concerns.   Dr. Bufford Lope, [place]

## 2017-05-06 NOTE — Assessment & Plan Note (Signed)
With continued UTI symptoms since hospital discharge. Urine culture showing Ecoli sensitive to Kelflex but also Klebsiella resistant to Keflex. Unfortunately d/t sensitivities, no oral agent available that can target both organisms.  - Continue Keflex, add Bactrim

## 2017-05-06 NOTE — Assessment & Plan Note (Addendum)
Asymptomatic today and HR in 50s, doing well. May need pacing in the future. - Follow up with cardiology. Previously saw Dr. Debara Pickett but has not seen recently so referral placed today.  - Will need sleep study in the future but given nasal deformity d/t nasal bridge reconstruction will likely need full face mask or nasal pillows. Given that, may need ENT advice.

## 2017-05-06 NOTE — Progress Notes (Signed)
Subjective:  Nicole Perez is a 83 y.o. female who presents to the [facility] today for hospital follow up.  Asymptomatic 2nd degree Mobitz Type 1 - Per chart review, admitted for dizziness and palpitations with bradycardia while on home diltiazem for ?Hx of PAF and HTN. Was seen by cardiology who felt patient did well upon discontinuation of diltiazem, although pacer may be required in the future. Cardiology also recommended sleep study as outpatient since patient has h/o snoring at apneas at night. - Since discharge, patient states has been doing well, feels improved. Did have 1 episode of dizziness yesterday but otherwise has been asymptomatic. No CP or palpitations. No SOB - Has not been checking BP at home. Not currently on any medications. No HA or vision changes, no syncope or falls.   Bilateral lower extremity weakness 2/2 severe spinal stenosis - Seen by Orthopedics during hospital stay who increased her home norco and recommended conservative management.  - Still having leg numbness and pain that is manageable. Currently taking norco 10-325mg  1 tablet in the am, 1/2 tablet at lunchtime, 1 tablet in pm as directed. Has not noticed an improvement in her pain control but attributes that to her urinary complaints that keep her active going to and from the bathroom. - Per chart review, RPR obtained during hospital stay for completeness which results positive. Patient has had negative testing for syphilis 2 years ago. Per patient, no sexual activity in 2+ years.  UTI in the setting of neurogenic bladder - Has been taking keflex for her dysuria but has not had any improvement. Continues to have dysuria and urinary frequency. - No fevers/chills, no abdominal pain, no n/v, no hematuria.   ROS: Per HPI  PMH: Smoking history reviewed.   Objective:  Physical Exam: BP (!) 174/82 (BP Location: Right Arm, Patient Position: Sitting, Cuff Size: Large)   Pulse (!) 52   Temp 98.2 F (36.8 C) (Oral)    Ht 4\' 11"  (1.499 m)   Wt 153 lb 12.8 oz (69.8 kg)   LMP 12/17/1962   SpO2 96%   BMI 31.06 kg/m   BP manual recheck 188/110 Gen: NAD, resting comfortably HEENT: PERRL, EOMI. CV: bradycardic, regular rhythm with no murmurs appreciated Pulm: NWOB, CTAB with no crackles, wheezes, or rhonchi GI: Normal bowel sounds present. Soft, Nontender, Nondistended. No CVA tenderness MSK: no edema, cyanosis, or clubbing noted Skin: warm, dry   Assessment/Plan:  Essential hypertension, benign Uncontrolled since taken off home diltiazem for 2nd degree Mobitz Type 1 in the hospital. Asymptomatic. Will need to avoid BB, CCB, and clonidine in the future. - Start HCTZ 25mg  qd and follow up in 2-3weeks with PCP  Mobitz type 1 second degree AV block Asymptomatic today and HR in 50s, doing well. May need pacing in the future. - Follow up with cardiology. Previously saw Dr. Debara Pickett but has not seen recently so referral placed today.  - Will need sleep study in the future but given nasal deformity d/t nasal bridge reconstruction will likely need full face mask or nasal pillows. Given that, may need ENT advice.  Spinal stenosis of lumbar region Severe spinal stenosis that is unchanged. Doing well on pain control overall though some slight exacerbation with her UTI. Will treat UTI as below and then reassess pain at next visit. - Continue norco 10-325mg  1 tablet in the am, 1/2 tablet at lunchtime, 1 tablet in pm  Bladder neurogenous With continued UTI symptoms since hospital discharge. Urine culture showing Ecoli  sensitive to Kelflex but also Klebsiella resistant to Keflex. Unfortunately d/t sensitivities, no oral agent available that can target both organisms.  - Continue Keflex, add Bactrim  Positive RPR Likely false positive 2/2 age given negative testing in the past but no sexual activity in 2+ years. - Treponemal Ag obtained today for confirmatory testing  Low vitamin D Currently on repletion with 50,000  IU weekly that was started during hospital admission - Recheck in 6-8weeks, approximately at the end of June 2018  Bufford Lope, DO PGY-1, [facility] 05/06/2017 9:16 AM

## 2017-05-08 LAB — FLUORESCENT TREPONEMAL AB(FTA)-IGG-BLD: Fluorescent Treponemal Ab, IgG: REACTIVE — AB

## 2017-05-14 ENCOUNTER — Telehealth: Payer: Self-pay | Admitting: Family Medicine

## 2017-05-14 DIAGNOSIS — N952 Postmenopausal atrophic vaginitis: Secondary | ICD-10-CM

## 2017-05-14 DIAGNOSIS — R300 Dysuria: Secondary | ICD-10-CM

## 2017-05-14 MED ORDER — ESTROGENS, CONJUGATED 0.625 MG/GM VA CREA: 1.0000 | TOPICAL_CREAM | Freq: Every day | VAGINAL | 5 refills | Status: DC

## 2017-05-14 NOTE — Telephone Encounter (Signed)
Will forward to MD to advise. Jazmin Hartsell,CMA  

## 2017-05-14 NOTE — Telephone Encounter (Signed)
Pt is burning and hurting at her catherization site.  She was told at the hospital there was a cream she could use. She would like to have something called in to the [place] at [employer]

## 2017-05-14 NOTE — Telephone Encounter (Signed)
CC" "burns at my bottom when I catheterize myself" No fever/chills/new back pain Did not start premarin cream prescribed at recent hospital discharge.   (+) RPR 1:1 and (+) treponemal antibody - Pt recalls being told her saddle nose was due to syphilis.  Recalls being treated with "shots in my butt at [place]" around age 83 years old.  Brother nearest in age was also treated with "shots" around the same time.  [facility] [employer] would pick her up from school to administer the "shots".  Ptient's brother told her later in life that it was for a sexuallytransmitted disease.  Ms Ocampo maiden name was Sabrin Dunlevy.   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  Will decrease frequency to twice a week.   I spoke with Ms Dorinne Graeff with [facility] (private line [phone]) with [facility] for inquiry as to diagnosis of Syphili, treatment regiment, and post-treatment non-treponemal titers that would have occurred in 1940s. She will see if they have local records pertaining to this case.    Ms Karlton Lemon recommended contacting [place] [facility] for communicable diseases in [place], contact: Ventura Sellers (telephone  [phone]) as they are more likely to have records of such a treatment from long ago.   I learned that they do not have records going back this far, but they were able to tell that  Ms Clyne had had an RPR 1:2 at [facility] 2014 per their [place] regional database.  (+) nontreponemal antibody F/U plan  Repeat RPR titer in >= 14 days,  If titer remains 1:2, 1:1 or is undetectable this would be compatible with prior successful treatment, then no further treatment would be needed.   If RPR titer rises to >=  1:4, this would be compatible with reinfection, lack of prior treatment or prior inadequate treatment which should be treated. Will recheck RPR titer at [facility] 05/30/17.

## 2017-05-30 ENCOUNTER — Encounter: Payer: Self-pay | Admitting: Family Medicine

## 2017-05-30 ENCOUNTER — Ambulatory Visit (INDEPENDENT_AMBULATORY_CARE_PROVIDER_SITE_OTHER): Payer: Medicare Other | Admitting: Family Medicine

## 2017-05-30 VITALS — BP 112/70 | HR 70 | Temp 98.8°F | Wt 153.0 lb

## 2017-05-30 DIAGNOSIS — I10 Essential (primary) hypertension: Secondary | ICD-10-CM | POA: Diagnosis not present

## 2017-05-30 DIAGNOSIS — G959 Disease of spinal cord, unspecified: Secondary | ICD-10-CM

## 2017-05-30 DIAGNOSIS — M150 Primary generalized (osteo)arthritis: Secondary | ICD-10-CM

## 2017-05-30 DIAGNOSIS — K6381 Dieulafoy lesion of intestine: Secondary | ICD-10-CM | POA: Diagnosis not present

## 2017-05-30 DIAGNOSIS — E559 Vitamin D deficiency, unspecified: Secondary | ICD-10-CM

## 2017-05-30 DIAGNOSIS — Z79899 Other long term (current) drug therapy: Secondary | ICD-10-CM | POA: Diagnosis not present

## 2017-05-30 DIAGNOSIS — G608 Other hereditary and idiopathic neuropathies: Secondary | ICD-10-CM

## 2017-05-30 DIAGNOSIS — D500 Iron deficiency anemia secondary to blood loss (chronic): Secondary | ICD-10-CM | POA: Diagnosis not present

## 2017-05-30 DIAGNOSIS — G8929 Other chronic pain: Secondary | ICD-10-CM

## 2017-05-30 DIAGNOSIS — Z79891 Long term (current) use of opiate analgesic: Secondary | ICD-10-CM | POA: Insufficient documentation

## 2017-05-30 DIAGNOSIS — G629 Polyneuropathy, unspecified: Secondary | ICD-10-CM | POA: Diagnosis not present

## 2017-05-30 DIAGNOSIS — R339 Retention of urine, unspecified: Secondary | ICD-10-CM | POA: Diagnosis not present

## 2017-05-30 DIAGNOSIS — M159 Polyosteoarthritis, unspecified: Secondary | ICD-10-CM

## 2017-05-30 DIAGNOSIS — Z9289 Personal history of other medical treatment: Secondary | ICD-10-CM | POA: Insufficient documentation

## 2017-05-30 DIAGNOSIS — I441 Atrioventricular block, second degree: Secondary | ICD-10-CM

## 2017-05-30 DIAGNOSIS — M549 Dorsalgia, unspecified: Secondary | ICD-10-CM

## 2017-05-30 DIAGNOSIS — M4716 Other spondylosis with myelopathy, lumbar region: Secondary | ICD-10-CM

## 2017-05-30 DIAGNOSIS — N319 Neuromuscular dysfunction of bladder, unspecified: Secondary | ICD-10-CM

## 2017-05-30 DIAGNOSIS — M48062 Spinal stenosis, lumbar region with neurogenic claudication: Secondary | ICD-10-CM

## 2017-05-30 HISTORY — DX: Personal history of other medical treatment: Z92.89

## 2017-05-30 HISTORY — DX: Disease of spinal cord, unspecified: G95.9

## 2017-05-30 MED ORDER — HYDROCODONE-ACETAMINOPHEN 10-325 MG PO TABS: 2.0000 | ORAL_TABLET | Freq: Two times a day (BID) | ORAL | 0 refills | Status: DC

## 2017-05-30 NOTE — Assessment & Plan Note (Signed)
Will first tryincrease in Norco 10 to two tabs bid Will add Lyrica or Neurontin if inadequate control. With Norco change.

## 2017-05-30 NOTE — Assessment & Plan Note (Signed)
Adequate blood pressure control.  No evidence of new end organ damage.  Tolerating medication without significant adverse effects.  Plan to continue current blood pressure regiment.   

## 2017-05-30 NOTE — Assessment & Plan Note (Addendum)
Established problem Uncontrollled Plan: Increase Norco 10/325 tab to two tab BID. Change based on patient's report of excellent control of pain and ability to perform ADLs with two tablets of Norco 10. If further analgesia required, will start low dose gabapentin RTC 4 weeks to access tolerance and efficacy.

## 2017-05-30 NOTE — Patient Instructions (Signed)
Change your Norco to 2 tablets twice a day If you go three days without having a bowel movement, take two Senokot tablets daily to help push out the stool.   Change the estrogen cream (Premarin) to only using twice a week at bedtime.   Use the lubrication that comes with your bladder catheters to see if it will decrease the pain of catheterizing yourself.   Dr Shenice Dolder will call you if your tests are not good. Otherwise he will send you a letter.  If you sign up for MyChart online, you will be able to see your test results once Dr Urie Loughner has reviewed them.  If you do not hear from [place] with in 2 weeks please call our office

## 2017-05-30 NOTE — Progress Notes (Signed)
   Subjective:    Patient ID: Nicole Perez, female    DOB: Nov 26, 1933, 83 y.o.   MRN: [id number] Nicole Perez is accompanied by daughter Sources of clinical information for visit is/are patient, relative(s) and past medical records. Nursing assessment for this office visit was reviewed with the patient for accuracy and revision.   HPI  Painful bladder catheterization - Onset in last 6 months - patient started on Premarin cream 2 weeks ago which help some - pt not using lubricant when inserting catheter thru urethra. - no suprapubic pain, no urgency, no fever, no nausea  Mobitz Type 1 - Noted during hospitalization 5/15. - Resolved with stopping Diltiazem - No recurrence of palpitations or weakness that resulted in hospitalization - No dizziness, no near-syncope/syncope - Pt declined polysomnography that cardiology had recommended during hospitalization   CHRONIC HYPERTENSION  Disease Monitoring  Blood pressure range: not checking at home   Chest pain: no   Dyspnea: no   Claudication: no   Medication compliance: yes, HCTZ 25 daily  Medication Side Effects  Lightheadedness: no   Urinary frequency: no   Edema: no    Preventitive Healthcare:  Exercise: no   Diet Pattern: good  Salt Restriction: yes  Lumbar spinal stenosis with Myelopathy - Onset several years ago - relatively stable course with intermittent exacerbations - no recent exacerbations - bilateral throbbing severe pain in legs bilaterally - pain not well controlled with Norco 10, 1 in am, 1/2 tab noon, 1 in pm - patient found 2 tab Norco relieved pain adequately to allow completion of ADLs and iADLs independently without confusion, sedation or constipation.  - No falls.  - Orthopedics during hospitalization after revieweing Spine MRI recommended medical mngt only.  - pt keeps lock-box for controlled usbstances and their paper scripts.    SH: No smoking    Review of Systems See HPI    Objective:   Physical Exam VS reviewed GEN: Alert, Cooperative, Groomed, NAD, walking with Rollator in flexed habitus HEENT: PERRL; EAC bilaterally not occluded, TM's translucent with normal LM, (+) LR;                No cervical LAN, No thyromegaly, No palpable masses COR: RRR, No M/G/R, No JVD, Normal PMI size and location LUNGS: BCTA, No Acc mm use, speaking in full sentences EXT: No peripheral leg edema.   Neuro: Oriented to person, place, and time; Strength: 5/5 Bil. UE and LE symmetric; Sensation: dec lite touch bilateral plantar surfaces; Cerebellar: Finger-to-Nose intact  Gait: Slow speed, No significant path deviation, Step through (-),  Psych: Normal affect/thought/speech/language        Assessment & Plan:

## 2017-05-30 NOTE — Assessment & Plan Note (Signed)
Pain with self-catheterization Recommend continue Premarin cream intravaginal at bedtime, decrease from daily to twice a week. Use a water-based lubricant when catheterizing self.   If the family need script for lubricated catheters, they will let Dr Oliviagrace Crisanti know.

## 2017-05-30 NOTE — Assessment & Plan Note (Signed)
Taking ergochalciferol 50K weeklyfor last 8 weeks.  If serum level adeauate will change to either daily or monthly dosing of Vitamin D.

## 2017-05-30 NOTE — Assessment & Plan Note (Signed)
Surveillance titer to see if prior tx in 1940s was successful, not adequate, or did not occur.  Stable titer will be 1:1 to 1:2.

## 2017-06-01 LAB — CBC
HEMATOCRIT: 35.5 % (ref 34.0–46.6)
HEMOGLOBIN: 11.9 g/dL (ref 11.1–15.9)
MCH: 29.2 pg (ref 26.6–33.0)
MCHC: 33.5 g/dL (ref 31.5–35.7)
MCV: 87 fL (ref 79–97)
Platelets: 195 10*3/uL (ref 150–379)
RBC: 4.07 x10E6/uL (ref 3.77–5.28)
RDW: 14.3 % (ref 12.3–15.4)
WBC: 4.1 10*3/uL (ref 3.4–10.8)

## 2017-06-01 LAB — RPR, QUANT+TP ABS (REFLEX): T Pallidum Abs: POSITIVE — AB

## 2017-06-01 LAB — BASIC METABOLIC PANEL
BUN / CREAT RATIO: 25 (ref 12–28)
BUN: 25 mg/dL (ref 8–27)
CO2: 24 mmol/L (ref 20–29)
CREATININE: 1 mg/dL (ref 0.57–1.00)
Calcium: 9.1 mg/dL (ref 8.7–10.3)
Chloride: 101 mmol/L (ref 96–106)
GFR calc Af Amer: 61 mL/min/{1.73_m2} (ref >59)
GFR, EST NON AFRICAN AMERICAN: 53 mL/min/{1.73_m2} — AB (ref >59)
Glucose: 91 mg/dL (ref 65–99)
Potassium: 4.1 mmol/L (ref 3.5–5.2)
SODIUM: 140 mmol/L (ref 134–144)

## 2017-06-01 LAB — RPR: RPR: REACTIVE — AB

## 2017-06-01 LAB — MAGNESIUM: Magnesium: 2.1 mg/dL (ref 1.6–2.3)

## 2017-06-01 LAB — VITAMIN D 25 HYDROXY (VIT D DEFICIENCY, FRACTURES): Vit D, 25-Hydroxy: 30.3 ng/mL (ref 30.0–100.0)

## 2017-06-01 LAB — FERRITIN: FERRITIN: 273 ng/mL — AB (ref 15–150)

## 2017-06-10 ENCOUNTER — Telehealth: Payer: Self-pay | Admitting: Family Medicine

## 2017-06-10 DIAGNOSIS — A506 Late congenital syphilis, latent: Secondary | ICD-10-CM

## 2017-06-10 DIAGNOSIS — Z9289 Personal history of other medical treatment: Secondary | ICD-10-CM

## 2017-06-11 DIAGNOSIS — A506 Late congenital syphilis, latent: Secondary | ICD-10-CM | POA: Insufficient documentation

## 2017-06-11 HISTORY — DX: Late congenital syphilis, latent: A50.6

## 2017-06-11 MED ORDER — CEPHALEXIN 500 MG PO CAPS: 500.0000 mg | ORAL_CAPSULE | Freq: Four times a day (QID) | ORAL | 0 refills | Status: AC

## 2017-06-11 NOTE — Assessment & Plan Note (Signed)
Rise in RPR titer from 1:1 to 1:4 > 2 weeks apart.  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

## 2017-06-11 NOTE — Assessment & Plan Note (Signed)
Rise in RPR titer from 1:1 to 1:4 > 2 weeks apart.  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

## 2017-06-11 NOTE — Telephone Encounter (Signed)
Pt returned dr Wendy Poet call. She is burning again and would like to have an antibotic called in. [facility]

## 2017-06-11 NOTE — Telephone Encounter (Signed)
Complining of urethral burning when catheterizing (+) suprapubic pain No fever/chills.  No new back pain. Pt self-caths  A/ possible UTI P/ Keflex 500 QID for 5 days.      If no improvement after 2 days of antibiotic therapy, pt to come in for evaluation and possible culture.

## 2017-06-11 NOTE — Telephone Encounter (Signed)
I discussed with Nicole Perez the rise in PRP titer from 1:1 to 1:4 greater than 2 weeks apart. I recommend that we put this question of inadequate syphilis treatment as a child to rest by giving 3 doses of Benzathine penicillin G 2.4 million units IM each at 1 week intervals for possible late latent syphilis.   Nicole Perez is to come into [facility] for nursing visits for the penicillin weekly for 3 weeks.

## 2017-06-11 NOTE — Telephone Encounter (Signed)
Will forward to MD to advise. Jazmin Hartsell,CMA  

## 2017-06-12 ENCOUNTER — Telehealth: Payer: Self-pay | Admitting: *Deleted

## 2017-06-12 ENCOUNTER — Ambulatory Visit (INDEPENDENT_AMBULATORY_CARE_PROVIDER_SITE_OTHER): Payer: Medicare Other | Admitting: *Deleted

## 2017-06-12 VITALS — BP 144/70 | HR 67

## 2017-06-12 DIAGNOSIS — A506 Late congenital syphilis, latent: Secondary | ICD-10-CM

## 2017-06-12 MED ORDER — PENICILLIN G BENZATHINE 1200000 UNIT/2ML IM SUSP
2.4000 10*6.[IU] | Freq: Once | INTRAMUSCULAR | Status: AC
  Administered 2017-06-12: 2.4 10*6.[IU] via INTRAMUSCULAR

## 2017-06-12 NOTE — Progress Notes (Signed)
   Patient brought into nursing clinic today for first dose of Bicillin-LA per Dr. Wendy Poet.  RPR titer 1:4. Bicillin 2.4 million Units x 1 RUOQ by Franchot Heidelberg., RN and Gae Bon by Vanessa Barbara., LPN. Advised to schedule another nurse visit next Wednesday 06/19/17([facility] closed for holiday) pt to schedule for Thursday 06/20/17.  Patient tolerated injections. Patient in clinic for about 10-15 minutes after injection for observation.  Derl Barrow, RN

## 2017-06-12 NOTE — Telephone Encounter (Signed)
Pt was upset that she was told her treatment was once a month.  Pt stated that speaking with provider yesterday, treatment would be once a month and last treatment would be August.  Nurse read the provider's instruction and stated it should be once a week for three doses.  Patient requested that PCP give her a call.  Derl Barrow, RN

## 2017-06-13 NOTE — Telephone Encounter (Signed)
I spoke with Nicole Perez.   I informed her that the CDC guidelines is PCN weekly for 3 weeks total.  I had misinformed her when I spoke to her previously on the phone.  She stated she understood.

## 2017-06-20 ENCOUNTER — Ambulatory Visit (INDEPENDENT_AMBULATORY_CARE_PROVIDER_SITE_OTHER): Payer: Medicare Other | Admitting: *Deleted

## 2017-06-20 ENCOUNTER — Encounter: Payer: Self-pay | Admitting: Family Medicine

## 2017-06-20 VITALS — BP 144/62 | HR 77

## 2017-06-20 DIAGNOSIS — A506 Late congenital syphilis, latent: Secondary | ICD-10-CM | POA: Diagnosis not present

## 2017-06-20 MED ORDER — PENICILLIN G BENZATHINE 1200000 UNIT/2ML IM SUSP
1.2000 10*6.[IU] | Freq: Once | INTRAMUSCULAR | Status: AC
  Administered 2017-06-20: 1.2 10*6.[IU] via INTRAMUSCULAR

## 2017-06-20 NOTE — Progress Notes (Signed)
   Patient in nurse clinic for daughter today for Bicillin #2.  Patient has no complaints at this time.  Patient did very well with the first set of injections.  Bicillin 2.4 million Units x 1 RUOQ by Franchot Heidelberg., RN and LUOQ by Estevan Ryder., CMA per Dr. McDiarmid's order. Third injection due Thursday 06/27/17 at 2:30 PM. Patient to follow up with PCP in the 1-2 months.  Derl Barrow, RN

## 2017-06-21 ENCOUNTER — Encounter: Payer: Self-pay | Admitting: Family Medicine

## 2017-06-24 ENCOUNTER — Encounter: Payer: Self-pay | Admitting: Family Medicine

## 2017-06-25 ENCOUNTER — Encounter: Payer: Self-pay | Admitting: Family Medicine

## 2017-06-27 ENCOUNTER — Ambulatory Visit (INDEPENDENT_AMBULATORY_CARE_PROVIDER_SITE_OTHER): Payer: Medicare Other | Admitting: *Deleted

## 2017-06-27 VITALS — BP 144/70 | HR 104

## 2017-06-27 DIAGNOSIS — A506 Late congenital syphilis, latent: Secondary | ICD-10-CM

## 2017-06-27 MED ORDER — PENICILLIN G BENZATHINE 1200000 UNIT/2ML IM SUSP
1.2000 10*6.[IU] | Freq: Once | INTRAMUSCULAR | Status: AC
  Administered 2017-06-27: 1.2 10*6.[IU] via INTRAMUSCULAR

## 2017-06-27 NOTE — Progress Notes (Signed)
   Patient brought into nurse clinic by daughter for Oliver treatment #3.  Patient had complaints of left hip pain since last injection.  Advised patient to try heat/ice and Ibuprofen if she can take. Bicillin 2.4 million Units x 1 RUOQ by Franchot Heidelberg., RN and LUOQ by Vanessa Barbara, LPN given per Dr. McDiarmid's order. Patient also discussed with nurse about incontinent supplies.  Patient is catheterizing herself daily and wearing adult pullups. Will discuss with PCP to see if patient's insurance with cover the cost of supplies.  Patient to follow up with PCP 1-2 months. Derl Barrow, RN

## 2017-07-25 ENCOUNTER — Encounter: Payer: Self-pay | Admitting: Family Medicine

## 2017-07-25 ENCOUNTER — Ambulatory Visit (INDEPENDENT_AMBULATORY_CARE_PROVIDER_SITE_OTHER): Payer: Medicare Other | Admitting: Family Medicine

## 2017-07-25 VITALS — BP 120/72 | HR 75 | Temp 98.4°F | Wt 154.0 lb

## 2017-07-25 DIAGNOSIS — R300 Dysuria: Secondary | ICD-10-CM | POA: Diagnosis not present

## 2017-07-25 DIAGNOSIS — M160 Bilateral primary osteoarthritis of hip: Secondary | ICD-10-CM | POA: Diagnosis not present

## 2017-07-25 LAB — POCT UA - MICROSCOPIC ONLY

## 2017-07-25 LAB — POCT URINALYSIS DIP (MANUAL ENTRY)
Bilirubin, UA: NEGATIVE
GLUCOSE UA: NEGATIVE mg/dL
Ketones, POC UA: NEGATIVE mg/dL
NITRITE UA: NEGATIVE
PH UA: 6.5 (ref 5.0–8.0)
Protein Ur, POC: NEGATIVE mg/dL
Spec Grav, UA: 1.015 (ref 1.010–1.025)
UROBILINOGEN UA: 0.2 U/dL

## 2017-07-25 MED ORDER — CEPHALEXIN 500 MG PO CAPS: 500.0000 mg | ORAL_CAPSULE | Freq: Three times a day (TID) | ORAL | 0 refills | Status: AC

## 2017-07-25 MED ORDER — IBUPROFEN 800 MG PO TABS: 800.0000 mg | ORAL_TABLET | Freq: Three times a day (TID) | ORAL | 0 refills | Status: DC | PRN

## 2017-07-25 NOTE — Patient Instructions (Signed)

## 2017-07-26 ENCOUNTER — Other Ambulatory Visit: Payer: Self-pay | Admitting: Family Medicine

## 2017-07-26 DIAGNOSIS — M150 Primary generalized (osteo)arthritis: Principal | ICD-10-CM

## 2017-07-26 DIAGNOSIS — G608 Other hereditary and idiopathic neuropathies: Secondary | ICD-10-CM

## 2017-07-26 DIAGNOSIS — M549 Dorsalgia, unspecified: Secondary | ICD-10-CM

## 2017-07-26 DIAGNOSIS — G8929 Other chronic pain: Secondary | ICD-10-CM

## 2017-07-26 DIAGNOSIS — M159 Polyosteoarthritis, unspecified: Secondary | ICD-10-CM

## 2017-07-26 DIAGNOSIS — G629 Polyneuropathy, unspecified: Secondary | ICD-10-CM

## 2017-07-26 NOTE — Telephone Encounter (Signed)
Daughter, Asa Lente calling to request refill of:  Name of Medication(s):  norco Last date of OV:  07/25/2017 Pharmacy:  Family to pick up  Will forward to MD.  Andreas Newport

## 2017-07-26 NOTE — Telephone Encounter (Signed)
Daughter called to get a refill on her mother's Hydrocodone.Please call when ready to pick up. jw

## 2017-07-27 NOTE — Progress Notes (Signed)
Subjective:    Patient ID: Nicole Perez, female    DOB: 06/25/1933, 83 y.o.   MRN: [id number]   CC:  Left hip pain and dysuria   HPI: Patient is a 83 yo female who presents today to discuss left hip pain and dysuria.  Left hip pain: Patient reports she has had this shooting pain down her left leg since she received a penicillin injection in her hip for syphilis treatment. Patient has been using here Norco two times a day and one ibuprofen 200 mg once a day.  She describes the pain as severe with minimal improvement with current regimen.  Dysuria: Patient endorses intense burning with voiding. Patient as been self catheterizing for three years for failed management of pelvic organ prolapse and neurogenic bladder. Patient has a history of urethral opening irritation due to unwillingness to lubricate prior to cath insertion.  Smoking status reviewed   ROS: all other systems were reviewed and are negative other than in the HPI   Past Medical History:  Diagnosis Date  . Abnormal angiography 04/22/16   third order arteries pancreatoduodenal occluded br embolization  . Acute blood loss anemia   . Acute renal failure ([place]) 02/23/2014  . AKI (acute kidney injury) ([place]) 04/17/2016  . Anemia due to blood loss, chronic 05/12/2016   Overview:  Added automatically from request for surgery [phone]   . Angiodysplasia of duodenum with hemorrhage   . ANTEROLATERAL ACETABULAR LABRAL TEAR BY MRI 09/11/2007   Qualifier: Diagnosis of  By: McDiarmid MD, Sherren Mocha    . Arterio-venous malformation 05/03/2016  . At risk for falls 08/06/2014  . AVM (arteriovenous malformation) of duodenum, acquired 04/09/16   Dr Henrene Pastor (GI) argon plasm coagulation via EGD  . BACK PAIN, CHRONIC 04/18/2010   degenerative spine disease, spinal stenosis throughout spine  . Bladder neurogenous 02/09/2014   May 2016 begins being followed by Dr Matilde Sprang at [facility] 2015.  Urinary retention (+).  Requiring self-catheterization of  bladder.  ENG.EMG [facility] (11/19/13): Absent H reflex responses raises possibility of concomitant S1 radiculopathies    . Bleeding gastrointestinal   . Bleeding gastrointestinal 05/03/2016  . Blepharitis 11/16/2014   Diagnosis by optometrist, Renaldo Harrison on exam 11/13/2014  . Blood in stool 12/20/2014  . Cervical spondylosis without myelopathy 08/07/2014   Cervical Spine MRI 08/06/14: 1. There is multilevel cervical spondylosis which has progressed compared with a previous MRI performed more than 10 years ago.  Compared with a more recent neck CT from 3 years ago, no significant changes are observed.  2. Posterior osteophytes, uncinate spurring and facet hypertrophy  contribute to mild foraminal narrowing at multiple levels. There is no cord deformity. There is a degenerative grade 1 anterolisthesis at C6-7.  3. No evidence of acute osseous or ligamentous injury.     . Chest pain 04/09/2016  . Cholelithiasis   . Chronic back pain 04/18/2010   Chronic nonspecific low back pain without radiculopathy that bagan after struck by [facility] in [facility].  Spinal Stenosis, Lumbar, diffuse thruoughout lumbar spine, maximal at L2-3 by MRI 11/10 (followed by Dr Phylliss Bob at [place]) Spinal Stenosis, Thoracic, maximal at  T10 -T11 by MRI 11/10 Spondylolisthesis, L4-5 by MRI 11/10.  Foraminal stenosis, bilaterally at L4 and at L5 by MRI 11/10 Foraminal stenosis, right, T11 by MRI 11/10 S/P L3-4, L4-5 facet joint intra-articular injection, Dr Normajean Glasgow ([place])    . Colon polyps 2006. 2016  adenomatous and hyperplaxtic  . Complicated UTI (urinary tract infection) 01/30/2016  . Complicated UTI (urinary tract infection) 01/30/2016  . Constipation 05/15/2016  . Cystocele 08/11/2013  . DEGENERATIVE JOINT DISEASE, HIPS 09/11/2007   Multilevel degenerative spine dz and spinal stenosis  . Demand ischemia ([place]) 05/03/16   [facility] noted during  GIB  . Demand ischemia ([place]) 05/03/2016   [facility] noted during GIB   . Dieulafoy lesion of jejunum 05/04/16   [facility] deep enteroscopy, lesion clipped.   . Dry eye syndrome 11/16/2014  . Duodenal ulcer 04/18/16   visible vessel on EGD  . Duodenal ulcer 04/18/2016   visible vessel on EGD   . Essential hypertension, benign 04/18/2010  . External hemorrhoid   . Gastric AVM 04/16/2016  . Gastrointestinal hemorrhage   . Gastrointestinal hemorrhage associated with angiodysplasia of stomach and duodenum   . Gastrointestinal hemorrhage with melena   . GI bleed 04/17/2016  . Glaucoma suspect 11/16/2014  . Gross hematuria 11/17/2015  . H. pylori infection 2016   h pylori erosive gastritis, treated with PPI, antibiotics.   Marland Kitchen Hearing loss sensory, bilateral 07/26/2011   Right >> Left.  Left ear hearing aid b/c work discrimination in       R. ear is very poor. Audiologist-Stephanie Nance at [facility] in [place].  (05/10/2010)   . Hemorrhoid prolapse 11/16/2016  . Hiatal hernia 05/05/2015   Large Hiatal Hernia found on EGD by Dr Hilarie Fredrickson (GI in [place]) in work up of melena and (+) FOBT.  Marland Kitchen History of colonic polyps 05/05/2015   Colonoscopy for melena and (+) FOBT by Dr Zenovia Jarred. In 03/2015. Eight sessile polyps ranging between 3-53mm in size were found in the ascending colon, transverse colon, and descending colon; polypectomies were performed with a cold snare 2. Multiple sessile polyps were found in the rectosigmoid colon 3. Mild diverticulosis was noted in the transverse colon, descending colon, and sigmoid colon    . History of pneumonia 07/26/2011  . History of syphilis 1940s   T[facility] as child at [facility] per [facility].  [facility] nor [facility] have records from [place]. Saddle nose.  Marland Kitchen HYPERLIPIDEMIA 05/10/2010   Qualifier: Diagnosis of  By: McDiarmid MD, Sherren Mocha    . Impaired functional mobility, balance, gait, and endurance 03/25/2017  . Incomplete bladder emptying 04/28/2014  . Incomplete emptying of bladder  02/09/2014  . Incontinence overflow, urine 04/28/2014  . INSOMNIA, CHRONIC 04/18/2010  . Iron deficiency anemia due to chronic blood loss 09/14/2016  . Junctional bradycardia   . Junctional rhythm 05/03/16   [facility] recommeded outpatient echo and nuclear stress test  . Lichen sclerosus et atrophicus of the vulva   . Melena 03/2016   several AVMs in duodenum on EGD.  ablated.   . Memory impairment 08/06/2014  . Mobitz type 1 second degree AV block 04/30/2017  . Myelopathy of lumbar region ([place]) 05/30/2017  . Obesity, unspecified 04/22/2013  . Orthostatic hypotension 08/06/2014  . Osteoarthritis 04/21/2010   Spinal Stenosis, Lumbar, diffuse thruoughout lumbar spine, maximal at L2-3 by MRI 11/10 (followed by Dr Phylliss Bob at [place]) Spinal Stenosis, Thoracic, maximal at  T10 -T11 by MRI 11/10 Spondylolisthesis, L4-5 by MRI 11/10.  Foraminal stenosis, bilaterally at L4 and at L5 by MRI 11/10 Foraminal stenosis, right, T11 by MRI 11/10 S/P L3-4, L4-5 facet joint intra-articular injection, Dr Normajean Glasgow ([place])    . Osteoarthritis of both hips 09/11/2007  Annotation: associated right hip anterolateral  labral tear, DEGENERATIVE JOINT DISEASE, RIGHT HIP BY MRI Qualifier: Diagnosis of  By: McDiarmid MD, Sherren Mocha    . Osteoarthritis of both knees 04/22/2013   Discussed use of low dose APAP and up to two tablets of hydrocodone/APA 7.5/325 a day as needed for painful exacerbation of knee pain.  Patient had 40 mg Solumedrol with 4 ml 1% lidocaine without epi injected into right knee with anterolateral approach after sterile prep.  No complications.      . Osteoarthritis, multiple sites 08/11/2013  . Overflow incontinence 04/28/2014  . Pain in the chest   . Paresthesia of both feet 08/06/2014  . Parotid adenoma 1990, 2012   Right parotid, recurrent parotid pleimorphic adenoma.   . Pedal edema 05/09/2016  . Peptic ulcer disease with  hemorrhage   . Peripheral artery disease ([place]) 03/07/2017   Left ABI 1.21  and Right ABI 0.94  . 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 Peroneal neuropathy 09/30/2014   EMG/NCS 10/20/13 showed decreased peroneal nerve function and chronic lumbar radiculopathy affecting L4 &L5 on the right and possibly affecting S1 on the right.  - Dr Rexene Alberts though right foot decrease in sensation and right foot drop could be multifactorial icnluding traumatic injury to right foot and degenerative back disease.    . Pure hypercholesterolemia 05/10/2010   Qualifier: Diagnosis of  By: McDiarmid MD, Sherren Mocha    . Rectal fissure 12/16/2013  . Right leg weakness 08/06/2014   [facility] EMG/NCS 10/20/13 showed decreased peroneal nerve function and chronic lumbar radiculopathy affecting L4 &L5 on the right and possibly affecting S1 on the right.  EMG/NCS 10/20/13 showed decreased peroneal nerve function and chronic lumbar radiculopathy affecting L4 &L5 on the right and possibly affecting S1 on the right.  - Dr Rexene Alberts though right foot decrease in sensation and right foot drop could be multifactorial icnluding traumatic injury to right foot and degenerative back disease.  Dr Rexene Alberts checked for peripheral neuropathy conditions from generalized diseases with blood work and repeat EMG/NCS and lumbar spine MRI - Lumbar MRI 11/05/13 showed Severe Degenerative lumbar disease with severe spinal stenosis at L1-2, L2-3, L3-4.  There is moderate stenosis at T12-L1 and multilevel foraminal stenosis.  There has been progression of degeenrative changes compared to 10/18/09 MRI - Cervical MRI 06/23/14 showed mulilevel cervical spondylosis that has progressed compared to MRI over 10 years pri  . Spinal stenosis of lumbar region 07/26/2011   10/20/13 Spine MRI ([facility], Dr Rexene Alberts) severe spinal stenosis L1-2, L2-3, L3-4 Spinal  Stenosis, Lumbar, diffuse thruoughout lumbar spine, maximal at L2-3 by MRI 11/10 (followed by Dr Phylliss Bob at [place]). There is moderate stenosis at T12-L1 and multilevel foraminal stenosis. There has been progression of degeenrative changes compared to 10/18/09 MRI  EMG/NCS 10/20/13 showed decreased peroneal nerve function and chronic lumbar radiculopathy affecting L4 &L5 on the right and possibly affecting S1 on the right.  - Dr Rexene Alberts though right foot decrease in sensation and right foot drop could be multifactorial icnluding traumatic injury to right foot and degenerative back disease.   - Cervical MRI 06/23/14 showed mulilevel cervical spondylosis that has progressed compared to MRI over 10 years prior.  Spinal Stenosis, Thoracic, maximal at  T10 -T11 by MRI 11/10 Spondylolisthesis, L4-5 by MRI 11/10.  Foraminal stenosis, bilaterally at L4 and at L5 by MRI 11/  . Spinal  stenosis of thoracic region 07/26/2011   Spinal Stenosis, Lumbar, diffuse thruoughout lumbar spine, maximal at L2-3 by MRI 11/10 (followed by Dr Phylliss Bob at [place]) Spinal Stenosis, Thoracic, maximal at  T10 -T11 by MRI 11/10 Spondylolisthesis, L4-5 by MRI 11/10.  Foraminal stenosis, bilaterally at L4 and at L5 by MRI 11/10 Foraminal stenosis, right, T11 by MRI 11/10 S/P L3-4, L4-5 facet joint intra-articular injection, Dr Normajean Glasgow ([place])   . ST segment depression 04/17/2016  . Symptomatic anemia 04/09/2016  . Urethral polyp 11/17/2015  . UTI (urinary tract infection) 02/22/2014  . Vitamin D deficiency 09/30/2012    Past Surgical History:  Procedure Laterality Date  . BLADDER SUSPENSION     Bladder tack x 2 (Dr Janice Norrie)  . BREAST LUMPECTOMY     Lumpectomy of benign Breast lumps bilaterally, Dr Bubba Camp  . CARPAL TUNNEL RELEASE  2010   Carpel Tunnel Release of  left wrist  03/2009 (Dr Fredna Dow): Nerve   . CARPAL  TUNNEL RELEASE     right wrist  . CATARACT EXTRACTION W/ INTRAOCULAR LENS IMPLANT  2010   Dr Charise Killian (ophth)  . COLONIC EMBOLIZATION  04/22/16   [facility] IR service  third order arteries pancreatoduodenal occluded by coil embolization x 2  . COLONIC EMBOLIZATION  04/30/16   [facility] IR coil embolization of GDA & last poertion of pancreaticoduodenal branch of SMA  . COLONOSCOPY W/ POLYPECTOMY  2006  . CYSTOCELE REPAIR     Rectal prolapse and cyctocele adter hysterectomy requiring anterior repair Felipa Emory, MD)  . ENTEROSCOPY N/A 04/18/2016   Procedure: ENTEROSCOPY;  Surgeon: Mauri Pole, MD;  Location: [facility] ENDOSCOPY;  Service: Endoscopy;  Laterality: N/A;  . ESOPHAGOGASTRODUODENOSCOPY N/A 04/10/2016   Procedure: ESOPHAGOGASTRODUODENOSCOPY (EGD);  Surgeon: Irene Shipper, MD; argon plasm coagulation duod AVMs Location: [facility] ENDOSCOPY;  Service: Endoscopy;  Laterality: N/A;  . GIVENS CAPSULE STUDY N/A 04/28/2016   Procedure: GIVENS CAPSULE STUDY;  Surgeon: Carol Ada, MD;  Location: [facility];  Service: Endoscopy;  Laterality: N/A;  . LAMINECTOMY     S/P L4-5 Laminectomy (1987) for decompression of spinal stenosis  . OTHER SURGICAL HISTORY  04/27/16   Capsule endoscopy showed bleed in deep small bowel  . PAROTID GLAND TUMOR EXCISION  1990   S/P excision of Right Parotid Gland Benign Tumor, 1990  . PAROTIDECTOMY  08/30/11   Radene Journey, MD (ENT) for recurrent right parotid pleomorphic adenoma by frozen section  . RECONSTRUCTION OF NOSE  1994   Nasal bridge reconstruction (Dr Judie Grieve, 1994) for following  Forklift accident on job. Surgery complicated by nerve damage resulting in difficulty raising right eyebrow   . RECTOCELE REPAIR     Rectal prolapse and cyctocele adter hysterectomy requiring anterior repair Felipa Emory, MD)  . SMALL BOWEL ENTEROSCOPY  05/04/16  . TOTAL ABDOMINAL HYSTERECTOMY W/ BILATERAL SALPINGOOPHORECTOMY  1964   Hysterectomy and bilateral oopherectomy at age 22 for  benign reasons  . URETHRAL DILATION      Past medical history, surgical, family, and social history reviewed and updated in the EMR as appropriate.  Objective:  BP 120/72   Pulse 75   Temp 98.4 F (36.9 C) (Oral)   Wt 154 lb (69.9 kg)   LMP 12/17/1962   SpO2 98%   BMI 31.10 kg/m   Vitals and nursing note reviewed  General: NAD, pleasant, able to participate in exam Cardiac: RRR, normal heart sounds, no murmurs. 2+  radial and PT pulses bilaterally Respiratory: CTAB, normal effort, No wheezes, rales or rhonchi Abdomen: soft, nontender, nondistended, no hepatic or splenomegaly, +BS Extremities: Limited exam on left left due to pain, tender to palpation on left hip, able to ambulate with some favoring of the right  Skin: warm and dry, no rashes noted Neuro: alert and oriented x4, no focal deficits Psych: Normal affect and mood   Assessment & Plan:   #Left hip pain, chronic Patient has a known history of moderate to severe bilateral osteoarthritis of the hips. Currently on Norco. Exacerbation in pain is likely secondary to recent penicillin injection.Will need to manage pain by increasing NSAIDs. Kidney function is normal patient should be able tolerate.  --Continue with current Norco regimen --Add ibuprofen 800 mg bid as needed  #Dysuria, acute Patient reports severe burning, post catheterization. Patient has consistently refused lubrication prior to cath. Likely a contributing fator. UA showed small LE but no nitrite will treat as UTI based on UA results and presenting symptoms. --Kelflex 500 mg tid for 10 days --Follow up as needed   Marjie Skiff, MD [place] PGY-2

## 2017-07-29 ENCOUNTER — Other Ambulatory Visit: Payer: Self-pay | Admitting: *Deleted

## 2017-07-29 DIAGNOSIS — I10 Essential (primary) hypertension: Secondary | ICD-10-CM

## 2017-07-29 MED ORDER — HYDROCODONE-ACETAMINOPHEN 10-325 MG PO TABS: 2.0000 | ORAL_TABLET | Freq: Two times a day (BID) | ORAL | 0 refills | Status: DC

## 2017-07-29 MED ORDER — HYDROCHLOROTHIAZIDE 25 MG PO TABS: 25.0000 mg | ORAL_TABLET | Freq: Every day | ORAL | 2 refills | Status: DC

## 2017-07-29 NOTE — Telephone Encounter (Signed)
Pt contacted and informed of rx ready [facility] pick [facility] at [facility], I could not get a hold of pts daughter.

## 2017-07-29 NOTE — Telephone Encounter (Signed)
Please let patient's daughter, [name], know her mother's prescription(s) are available for pick up from the [facility] front desk.

## 2017-08-08 ENCOUNTER — Telehealth: Payer: Self-pay | Admitting: *Deleted

## 2017-08-08 NOTE — Telephone Encounter (Signed)
Patient called stating that the Penicillin injection given on her left hip area is still painful.  She was seen by Dr. Andy Gauss, and followed all the instructions given. Patient is requesting a call from PCP.  Derl Barrow, RN

## 2017-08-09 MED ORDER — PREDNISONE 20 MG PO TABS: 40.0000 mg | ORAL_TABLET | Freq: Every day | ORAL | 0 refills | Status: AC

## 2017-08-09 NOTE — Telephone Encounter (Signed)
Patient is scheduled on Dr. Arlana Pouch clinic but note made for PCP to see. Najat Olazabal,CMA

## 2017-08-09 NOTE — Telephone Encounter (Signed)
Please schedule SDA for Nicole Perez on Monday morning 8:30 am.  Call Savien Mamula to see patient.

## 2017-08-09 NOTE — Telephone Encounter (Signed)
Pain in left posterior hip with radiation into left foot.  Pain impairing ability to bear weight on left leg enough to walk.  Inadequate relief with Norco, Ibuprofen, ICE/Heat.    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   Patient has known Lumbar spinal stenosis with Myelopathy with bilateral throbing severe pain in legs (05/30/17 ov Marilyn Nihiser), improved with increase Norco 10/325 1-2 tab BID, Admission hospital 5/15 -01/02/17  Dr Lynann Bologna (Orthopedics) recommended medical mngt only bc scope of surgery she would need. He said she is welcome to call his office if symptoms are unlivable.   1) Thoracolumbar MRI (04/30/17) for lower extremity numbness - Lumbar:   - Advanced Multilevel spondylosis with severe               multilevel canal stenosis, worst at L3-4             - Severe bilateral L4 foraminal stenosis -Thorax            - Multilevel DDD with facet arthrosis and severe              canal stenosis  2) painful peripheral sensorimotor polyneuropathy    Recommendation - If unable to tolerate pain over weekend, go to ED  - If able to tolerate pain over weekend, will try trial of prednisone for the radiculopathy and schedule appointment with SDA on Monday 8/27 for evaluation. May increase opioid therapy +/- Gabapentin  Rule out intragluteal abscess. ? MRI.   - Other options,  Dr Normajean Glasgow ([facility]) has performed injections that have helped in past. Dr Lynann Bologna (Ortho) has followed her spinal orthopedic issues inpast. Dr Rexene Alberts [facility]) has followed her spinal disease from Neuro point of view.

## 2017-08-12 ENCOUNTER — Ambulatory Visit (INDEPENDENT_AMBULATORY_CARE_PROVIDER_SITE_OTHER): Payer: Medicare Other | Admitting: Family Medicine

## 2017-08-12 ENCOUNTER — Encounter: Payer: Self-pay | Admitting: Family Medicine

## 2017-08-12 VITALS — BP 158/78 | HR 66 | Temp 98.3°F | Wt 151.0 lb

## 2017-08-12 DIAGNOSIS — Z79899 Other long term (current) drug therapy: Secondary | ICD-10-CM | POA: Diagnosis not present

## 2017-08-12 DIAGNOSIS — T8090XA Unspecified complication following infusion and therapeutic injection, initial encounter: Secondary | ICD-10-CM

## 2017-08-12 DIAGNOSIS — I10 Essential (primary) hypertension: Secondary | ICD-10-CM

## 2017-08-12 DIAGNOSIS — D500 Iron deficiency anemia secondary to blood loss (chronic): Secondary | ICD-10-CM

## 2017-08-12 DIAGNOSIS — R7989 Other specified abnormal findings of blood chemistry: Secondary | ICD-10-CM

## 2017-08-12 DIAGNOSIS — R946 Abnormal results of thyroid function studies: Secondary | ICD-10-CM | POA: Diagnosis not present

## 2017-08-12 DIAGNOSIS — M48062 Spinal stenosis, lumbar region with neurogenic claudication: Secondary | ICD-10-CM | POA: Diagnosis not present

## 2017-08-12 DIAGNOSIS — M5410 Radiculopathy, site unspecified: Secondary | ICD-10-CM

## 2017-08-12 DIAGNOSIS — M5432 Sciatica, left side: Secondary | ICD-10-CM

## 2017-08-12 DIAGNOSIS — M5430 Sciatica, unspecified side: Secondary | ICD-10-CM

## 2017-08-12 DIAGNOSIS — R202 Paresthesia of skin: Secondary | ICD-10-CM | POA: Diagnosis not present

## 2017-08-12 HISTORY — DX: Sciatica, unspecified side: M54.30

## 2017-08-12 MED ORDER — HYDROCHLOROTHIAZIDE 25 MG PO TABS: 25.0000 mg | ORAL_TABLET | Freq: Every day | ORAL | 3 refills | Status: DC

## 2017-08-12 MED ORDER — OXYCODONE HCL 10 MG PO TABS: 20.0000 mg | ORAL_TABLET | Freq: Four times a day (QID) | ORAL | 0 refills | Status: DC | PRN

## 2017-08-12 NOTE — Progress Notes (Signed)
Nicole Perez is accompanied by daughter Sources of clinical information for visit is/are patient, relative(s) and past medical records. Nursing assessment for this office visit was reviewed with the patient for accuracy and revision.   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  . Worse with: Standing, walking, lying.     Better with: flexing left hip and knee   Radiation: report of both "whole leg" but traces pain down posterior thigh then into "whole foot."  Episodes of tingling from left buttock down whole leg to foot.    Trauma: no falls or trauma.   06/12/17 first Bicillin injection recorded as 1.108M unit Bicillin in RUOQ and same into LUOQ -  06/20/17 second Bicillin injection - pt reported as tolerating last injections well.  Similar RUOQ and LUOQ split injections.  No recorded complaint.  06/27/17 third Bicillin nursing note reports complaint of "left hip pain since last injection."  Pt advised to use heat and ice and ibuprofen. Similar RUOQ and LUOQ split injections. No acute complaint after injection recorded.   Best sitting/standing/leaning forward: no  Relief: Norco 10/325 tablets, two tablets 10 am and 10 pm.  Some pain relief at bed time, but awakens around 3 pm with pain when she repositions her left leg or sits in chair with leg propped up and takes half Norco 10/325 tablet with some releif and return to sleep.  No improvement with Ibuprofen 800 mg nor pulse dose of Prednsione since 08/09/17 prescribed by Dr Zitlali Primm after phone conversation with patient about buttock and left leg pain.   Function: Able to perform ADL's independently./ [place] Chronic Urinary retention from severe longstanding lumbar spinal stenosis Numbness/Weakness: yes, chronic bilateral numbness.  New  tingling   Fever/chills/sweats: no  Night pain: yes  Unexplained weight loss: no  No relief with bedrest: yes  h/o cancer/immunosuppression: no  IV drug use: no  PMH:  Review of old records 05/30/17 [facility] office visit with Jailani Hogans: Severe lumbar spinal stenosis with myelopathy and throbbing severe pain in legs responsive to Norco 10/325 1-2 tab BID 5-15-5/17/18 Hospital admission for back and bilateral laeg pain: Dr Lynann Bologna (Ortho) recommended medical management only because of surgery she would need. Reconsult only if symptoms are unlivable.   05/03/18 Thoracolumbar MRI for lower extremity numbness - Lumbar:Advanced Multilevel spondylosis with severe multilevel canal stenosis, worst at L3-4 and Severe bilateral L4 foraminal stenosis -Thorax: Multilevel DDD with facet arthrosis and severe canal stenosis  11/19/13 ENG/EMG [facility]: possible right S1 radiculopathy.  Axonal sensorimotor polyneuropathies bilaterally. Decreased peroneal nerve function and right L4& L5 radiculopathy  10/2011: L3-4, L4-5 facet joint intra-articular injections by Dr Jeanelle Malling ([place])  [place] stuck patient with onset low back pain w/o radiculopathy  Sh: no smoking  ROS: See HPI  Physical exam Vitals:   08/12/17 0858  BP: (!) 158/78  Pulse: 66  Temp: 98.3 F (36.8 C)  SpO2: 99%   Gen: Alert, HOH, cooperative, groomed, mild distress that improves with social interaction MSK:  Back: no visible deformity in parasacral or buttocks observed. Old midline surgical lumbar scar. Tender to moderate palpation just caudal to left post iliac spine and region of left SI joint. No palpable mass.  No midline tenderness to percussion.   (+) tender to  moderate palpation lateral to left ischial tuberosity.  No pain with int/ext rotation left hip.  No muscle wasting of calves nor thighs bilaterally  Neurologic exam:  Straight leg raising:     Seated:   Pain only in left buttock      Seated  slump:     negative  Femoral nerve stretch:    negative    Muscle power testing reveals: Gastroc-soleus / Toe raises  without difficulty Plantar flexion    5 / 5       Foot Inversion    5 / 5       Dorsiflexion of 1st toe   5 / 5       Foot eversion    5 / 5             Hip Flexion    5 / 5       Leg Flexion    5 / 5        Reflexes Knee     Zero bilaterally       Ankle     Zero bilaterally  Babinski negative bilaterally Sensation:  Monofilament: intact bilaterally in feet and forelegs Vibration: intact bilateral great toes Temperature: Intact bilateral feet dorsum         30 minutes face to face where spent in total with counseling / coordination of care took more than 50% of the total time. Counseling involved discussion differential diagnosis, testing, prognosis, adherence, risk reduction, benefits of treatment, instructions, compliance.

## 2017-08-12 NOTE — Assessment & Plan Note (Signed)
New problem While it is possible that the pain could originate at the extrapelvic portion of the sciatic nerve cause by IM injection Bicillin (either 7/5 or 7/12 IM injections) both nursing procedure notes document use of LUOQ IM injections.  The patient and her dgt are quite concerned that the buttock and leg pain is related to the IM injections.    Other possibility is that this pain is originating from compressive injury of a intraspinal lumbar root that contributes to the sciatic nerve (L4 to S1). Inconsistent description of the pain districution would favor a left L5 or S1 root involvement.  No new findings pointing towards cauda equina such as bilateral weakness nor saddle numbness/tingling.  metablolic origin of radicular paresthesia / pain much less likely.   Further workup planned Labs: CBC, CMET, C-RP, Vit B12, TSH Imaging:  Lumbar MRI w/o contrast to assess for compressive radiculopathy Pelvic MRI with and without contrast to assess for extrapelvic process involving left sciatic nerve.    Treatment Stop Norco Start Oxycodone 10 mg tablet, 2 tablets every 6 hour prn moderate to severe pain - nonacute pain  Stop Ibuprofen for lack of benefit Stop Prednisone pulse dose for lack of benefit  May start low dose Gabapentin next ov if inadequate pain control.\ RTC next week to follow up labs/imaging and analgesia.  Possible referral to Ortho for surgical or PM&R for injection therapy.

## 2017-08-12 NOTE — Patient Instructions (Addendum)
Stop the Norco pain pill (hydrocodone pills) Start Oxycodone pain pills, two tablets as needed every six hours.  Watch for constipation and making you sleeping.  Stop the Prednisone since it is not helping with your pain.  Stop the Ibuprofen since it is not helping your pain.   Get MRI of back and pelvis to look for pinched nerves causing back and left leg pain.   Dr Sabastion Hrdlicka will call you if your tests are not good. Otherwise he will send you a letter.  Dr Saachi Zale will see you next week to see how you are doing.

## 2017-08-13 ENCOUNTER — Telehealth: Payer: Self-pay | Admitting: Family Medicine

## 2017-08-13 ENCOUNTER — Encounter: Payer: Self-pay | Admitting: Family Medicine

## 2017-08-13 DIAGNOSIS — R7989 Other specified abnormal findings of blood chemistry: Secondary | ICD-10-CM | POA: Insufficient documentation

## 2017-08-13 LAB — C-REACTIVE PROTEIN: CRP: 3.1 mg/L (ref 0.0–4.9)

## 2017-08-13 LAB — CBC WITH DIFFERENTIAL/PLATELET
BASOS ABS: 0 10*3/uL (ref 0.0–0.2)
Basos: 0 %
EOS (ABSOLUTE): 0.1 10*3/uL (ref 0.0–0.4)
EOS: 1 %
HEMATOCRIT: 36.4 % (ref 34.0–46.6)
Hemoglobin: 12.2 g/dL (ref 11.1–15.9)
IMMATURE GRANULOCYTES: 0 %
Immature Grans (Abs): 0 10*3/uL (ref 0.0–0.1)
Lymphocytes Absolute: 2.8 10*3/uL (ref 0.7–3.1)
Lymphs: 37 %
MCH: 29.6 pg (ref 26.6–33.0)
MCHC: 33.5 g/dL (ref 31.5–35.7)
MCV: 88 fL (ref 79–97)
MONOS ABS: 0.5 10*3/uL (ref 0.1–0.9)
Monocytes: 7 %
NEUTROS PCT: 55 %
Neutrophils Absolute: 4.2 10*3/uL (ref 1.4–7.0)
Platelets: 224 10*3/uL (ref 150–379)
RBC: 4.12 x10E6/uL (ref 3.77–5.28)
RDW: 14.2 % (ref 12.3–15.4)
WBC: 7.6 10*3/uL (ref 3.4–10.8)

## 2017-08-13 LAB — CMP14+EGFR
ALBUMIN: 4.3 g/dL (ref 3.5–4.7)
ALT: 15 IU/L (ref 0–32)
AST: 20 IU/L (ref 0–40)
Albumin/Globulin Ratio: 1.5 (ref 1.2–2.2)
Alkaline Phosphatase: 59 IU/L (ref 39–117)
BILIRUBIN TOTAL: 0.3 mg/dL (ref 0.0–1.2)
BUN / CREAT RATIO: 28 (ref 12–28)
BUN: 31 mg/dL — AB (ref 8–27)
CHLORIDE: 98 mmol/L (ref 96–106)
CO2: 26 mmol/L (ref 20–29)
Calcium: 9.2 mg/dL (ref 8.7–10.3)
Creatinine, Ser: 1.11 mg/dL — ABNORMAL HIGH (ref 0.57–1.00)
GFR calc non Af Amer: 46 mL/min/{1.73_m2} — ABNORMAL LOW (ref >59)
GFR, EST AFRICAN AMERICAN: 53 mL/min/{1.73_m2} — AB (ref >59)
GLOBULIN, TOTAL: 2.8 g/dL (ref 1.5–4.5)
GLUCOSE: 82 mg/dL (ref 65–99)
Potassium: 3.7 mmol/L (ref 3.5–5.2)
SODIUM: 138 mmol/L (ref 134–144)
TOTAL PROTEIN: 7.1 g/dL (ref 6.0–8.5)

## 2017-08-13 LAB — TSH: TSH: 6.7 u[IU]/mL — ABNORMAL HIGH (ref 0.450–4.500)

## 2017-08-13 LAB — VITAMIN B12: Vitamin B-12: 419 pg/mL (ref 232–1245)

## 2017-08-13 NOTE — Telephone Encounter (Signed)
Patient is aware of results and states that her oxycodone has helped ease the pain so she can sleep through the night.  She is also aware that she will be hearing back from [facility] about possibly changing her appointment date with them. Nicole Perez,CMA

## 2017-08-13 NOTE — Telephone Encounter (Signed)
Pt called Triage line and LM to "please call her back".  I wonder if it is in reference to this?     Called back but no answer, asked her to return call.  Gionna Polak, [name], CMA

## 2017-08-13 NOTE — Telephone Encounter (Signed)
Left message about normal blood work except slight rise in TSh that will check FT4 next week. Also wanted to know if the oxycodone was helping with pain and if she was tolerating it. Finally, wanted to let her know I was asking [facility] to reschedule her MRIs to a ssoner date than the current 08/26/17 so we can have the results when she follows up with me on 9/6.

## 2017-08-13 NOTE — Assessment & Plan Note (Signed)
Lab Results  Component Value Date   TSH 6.700 (H) 08/12/2017   Check FT4 next ov next week.

## 2017-08-14 ENCOUNTER — Other Ambulatory Visit: Payer: Medicare Other

## 2017-08-17 ENCOUNTER — Ambulatory Visit
Admission: RE | Admit: 2017-08-17 | Discharge: 2017-08-17 | Disposition: A | Discharge status: Home / Self Care | Payer: Medicare Other | Source: Ambulatory Visit | Attending: Family Medicine | Admitting: Family Medicine

## 2017-08-17 DIAGNOSIS — T8090XA Unspecified complication following infusion and therapeutic injection, initial encounter: Secondary | ICD-10-CM

## 2017-08-17 DIAGNOSIS — M48062 Spinal stenosis, lumbar region with neurogenic claudication: Secondary | ICD-10-CM

## 2017-08-17 DIAGNOSIS — M48061 Spinal stenosis, lumbar region without neurogenic claudication: Secondary | ICD-10-CM | POA: Diagnosis not present

## 2017-08-17 DIAGNOSIS — M5410 Radiculopathy, site unspecified: Secondary | ICD-10-CM

## 2017-08-17 DIAGNOSIS — M461 Sacroiliitis, not elsewhere classified: Secondary | ICD-10-CM | POA: Insufficient documentation

## 2017-08-17 DIAGNOSIS — M5137 Other intervertebral disc degeneration, lumbosacral region: Secondary | ICD-10-CM | POA: Diagnosis not present

## 2017-08-17 MED ORDER — GADOBENATE DIMEGLUMINE 529 MG/ML IV SOLN
14.0000 mL | Freq: Once | INTRAVENOUS | Status: AC | PRN
  Administered 2017-08-17: 14 mL via INTRAVENOUS

## 2017-08-20 ENCOUNTER — Telehealth: Payer: Self-pay | Admitting: Family Medicine

## 2017-08-20 NOTE — Telephone Encounter (Signed)
I spoke with Mrs Nicole Perez about the Spine and pelvic  findings of just SQ fatty induration at injeciton sit, the presence of left iliopsoas bursitis and alar osteoarthritis that may better explain her left buttock and left leg pain than the subcutaneous PCN injection.   She has decreased her oxycodone dose down to one tablet every six prn.  She notes feeling more tired with less desire to start tasks.  Once tasks started she is able to complete tham.  She continues to do her ADLs independently and dgts helping with iADLs as needed.  No change in plans currently.  I will see Mrs Nicole Perez on Thursday, 9/6.

## 2017-08-22 ENCOUNTER — Encounter: Payer: Self-pay | Admitting: Family Medicine

## 2017-08-22 ENCOUNTER — Ambulatory Visit (INDEPENDENT_AMBULATORY_CARE_PROVIDER_SITE_OTHER): Payer: Medicare Other | Admitting: Family Medicine

## 2017-08-22 DIAGNOSIS — M7918 Myalgia, other site: Secondary | ICD-10-CM

## 2017-08-22 DIAGNOSIS — Z7409 Other reduced mobility: Secondary | ICD-10-CM | POA: Diagnosis not present

## 2017-08-22 DIAGNOSIS — M791 Myalgia: Secondary | ICD-10-CM

## 2017-08-22 DIAGNOSIS — Z23 Encounter for immunization: Secondary | ICD-10-CM | POA: Diagnosis not present

## 2017-08-22 DIAGNOSIS — R339 Retention of urine, unspecified: Secondary | ICD-10-CM | POA: Diagnosis not present

## 2017-08-22 NOTE — Patient Instructions (Signed)
I believe your left hip and leg pain are coming from a couple of sites, a bursitis of the hip and arthritis of your sacroiliac joint.   Working with Physical Therapy should help with these problems.    We will ask home Health Physical Therapy to evaluate and treat your hip and leg pain.  It is all right to cut your pain pills so you can take a lower dose.  Use the high dose pain pill if the other pain pills are not providing you with adequate pain relief.   Dr Talma Aguillard will see you back in a few weeks to see how you are doing.

## 2017-08-23 DIAGNOSIS — M7918 Myalgia, other site: Secondary | ICD-10-CM | POA: Insufficient documentation

## 2017-08-23 HISTORY — DX: Myalgia, other site: M79.18

## 2017-08-23 NOTE — Assessment & Plan Note (Addendum)
Established problem that has improving slowly.  Multiple possible origins of pain based on exam and pelvic MRI images: right iliopsoas bursitis, SI joint arthritis.  I suspect minimal if any contribution of medial SI region pain from the fatty tissue enduration at PCN injection region. Relatively superficial IM right  buttock  injection sites would not involve the deeper sciatic nerve.    Will continue Norco 10/325 for moderate pain and Oxycodone 10 mg for severe pain or unresponsive pain in buttock.  Will refer pt for home PT to help mobilize her and improve muscle endurance.

## 2017-08-23 NOTE — Progress Notes (Signed)
   Subjective:    Patient ID: Nicole Perez, female    DOB: Oct 21, 1933, 83 y.o.   MRN: [id number] REEMA CHICK is accompanied by daughter - Daughter was recording visit with her smart phone.  Sources of clinical information for visit is/are patient, relative(s) and past medical records. Nursing assessment for this office visit was reviewed with the patient for accuracy and revision.  Depression screen PHQ 2/9 08/22/2017  Decreased Interest 0  Down, Depressed, Hopeless 0  PHQ - 2 Score 0  Some recent data might be hidden   Fall Risk  08/22/2017 08/12/2017 07/25/2017 06/27/2017 06/20/2017  Falls in the past year? No No No No No  Number falls in past yr: - - - - -  Injury with Fall? - - - - -  Risk Factor Category  - - - - -  Risk for fall due to : - - - - -  Risk for fall due to: Comment - - - - -    HPI  Left Buttock pain - onset around 06/20/17 - course: slow improving. - Less pain - less radiation of pain down into left leg. .  - Relief: has returned to using Norco 10/325 two tablets for moderate pain, and using oxycodone 10 mg tablet one tablet for severe pain or pain unresponsive to Norco. - Denies dizziness, confusion, worsening constipation - Continues to be independent in all her iADLs and ADLs. Good appetite.   Review of Systems No fever No abdominal pain    Objective:   Physical Exam VS reviewed GEN: Alert, Cooperative, Groomed, NAD Back; Tender to mod palpation over left SI region. No masses nor deformities. Tender over left ishial tuberosity. No pain with resisted left hip flexion or int/ext rotation.  Strength 5/5 ankle Flex/Ext. Knee flex ext, hip flex / ext. Bilaterally throughout. Gait: uising rolling walker, slight decrease in speed, No significant path deviation, Step through not present, leans forward onto walker.  Able to rise from chair unassisted other than using walker.  Psych: Normal affect/thought/speech/language    Assessment & Plan:   30 minutes face to  face where spent in total with counseling / coordination of care took more than 50% of the total time. Counseling involved discussion differential diagnosis, testing, prognosis, adherence, risk reduction, benefits of treatment, instructions, compliance. Went over Pelvic MRI images with patient and her dgt. [name] took photos of monitor screen displays of MRI images.

## 2017-08-26 ENCOUNTER — Other Ambulatory Visit: Payer: Medicare Other

## 2017-08-26 DIAGNOSIS — M47812 Spondylosis without myelopathy or radiculopathy, cervical region: Secondary | ICD-10-CM | POA: Diagnosis not present

## 2017-08-26 DIAGNOSIS — M4805 Spinal stenosis, thoracolumbar region: Secondary | ICD-10-CM | POA: Diagnosis not present

## 2017-08-26 DIAGNOSIS — M7072 Other bursitis of hip, left hip: Secondary | ICD-10-CM | POA: Diagnosis not present

## 2017-08-26 DIAGNOSIS — M4698 Unspecified inflammatory spondylopathy, sacral and sacrococcygeal region: Secondary | ICD-10-CM | POA: Diagnosis not present

## 2017-08-26 DIAGNOSIS — M150 Primary generalized (osteo)arthritis: Secondary | ICD-10-CM | POA: Diagnosis not present

## 2017-09-02 DIAGNOSIS — M47812 Spondylosis without myelopathy or radiculopathy, cervical region: Secondary | ICD-10-CM | POA: Diagnosis not present

## 2017-09-02 DIAGNOSIS — M7072 Other bursitis of hip, left hip: Secondary | ICD-10-CM | POA: Diagnosis not present

## 2017-09-02 DIAGNOSIS — M150 Primary generalized (osteo)arthritis: Secondary | ICD-10-CM | POA: Diagnosis not present

## 2017-09-02 DIAGNOSIS — M4805 Spinal stenosis, thoracolumbar region: Secondary | ICD-10-CM | POA: Diagnosis not present

## 2017-09-02 DIAGNOSIS — M4698 Unspecified inflammatory spondylopathy, sacral and sacrococcygeal region: Secondary | ICD-10-CM | POA: Diagnosis not present

## 2017-09-06 DIAGNOSIS — M7072 Other bursitis of hip, left hip: Secondary | ICD-10-CM | POA: Diagnosis not present

## 2017-09-06 DIAGNOSIS — M4805 Spinal stenosis, thoracolumbar region: Secondary | ICD-10-CM | POA: Diagnosis not present

## 2017-09-06 DIAGNOSIS — M47812 Spondylosis without myelopathy or radiculopathy, cervical region: Secondary | ICD-10-CM | POA: Diagnosis not present

## 2017-09-06 DIAGNOSIS — M4698 Unspecified inflammatory spondylopathy, sacral and sacrococcygeal region: Secondary | ICD-10-CM | POA: Diagnosis not present

## 2017-09-06 DIAGNOSIS — M150 Primary generalized (osteo)arthritis: Secondary | ICD-10-CM | POA: Diagnosis not present

## 2017-09-06 IMAGING — CR DG CHEST 1V PORT
1 series · 1 of 1 positions shown · non-contrast
Comparison: 04/09/2016

CLINICAL DATA: Pain between the shoulder blades and in the chest
for 2 hours. Heart monitor.

EXAM:
PORTABLE CHEST 1 VIEW

[AP]
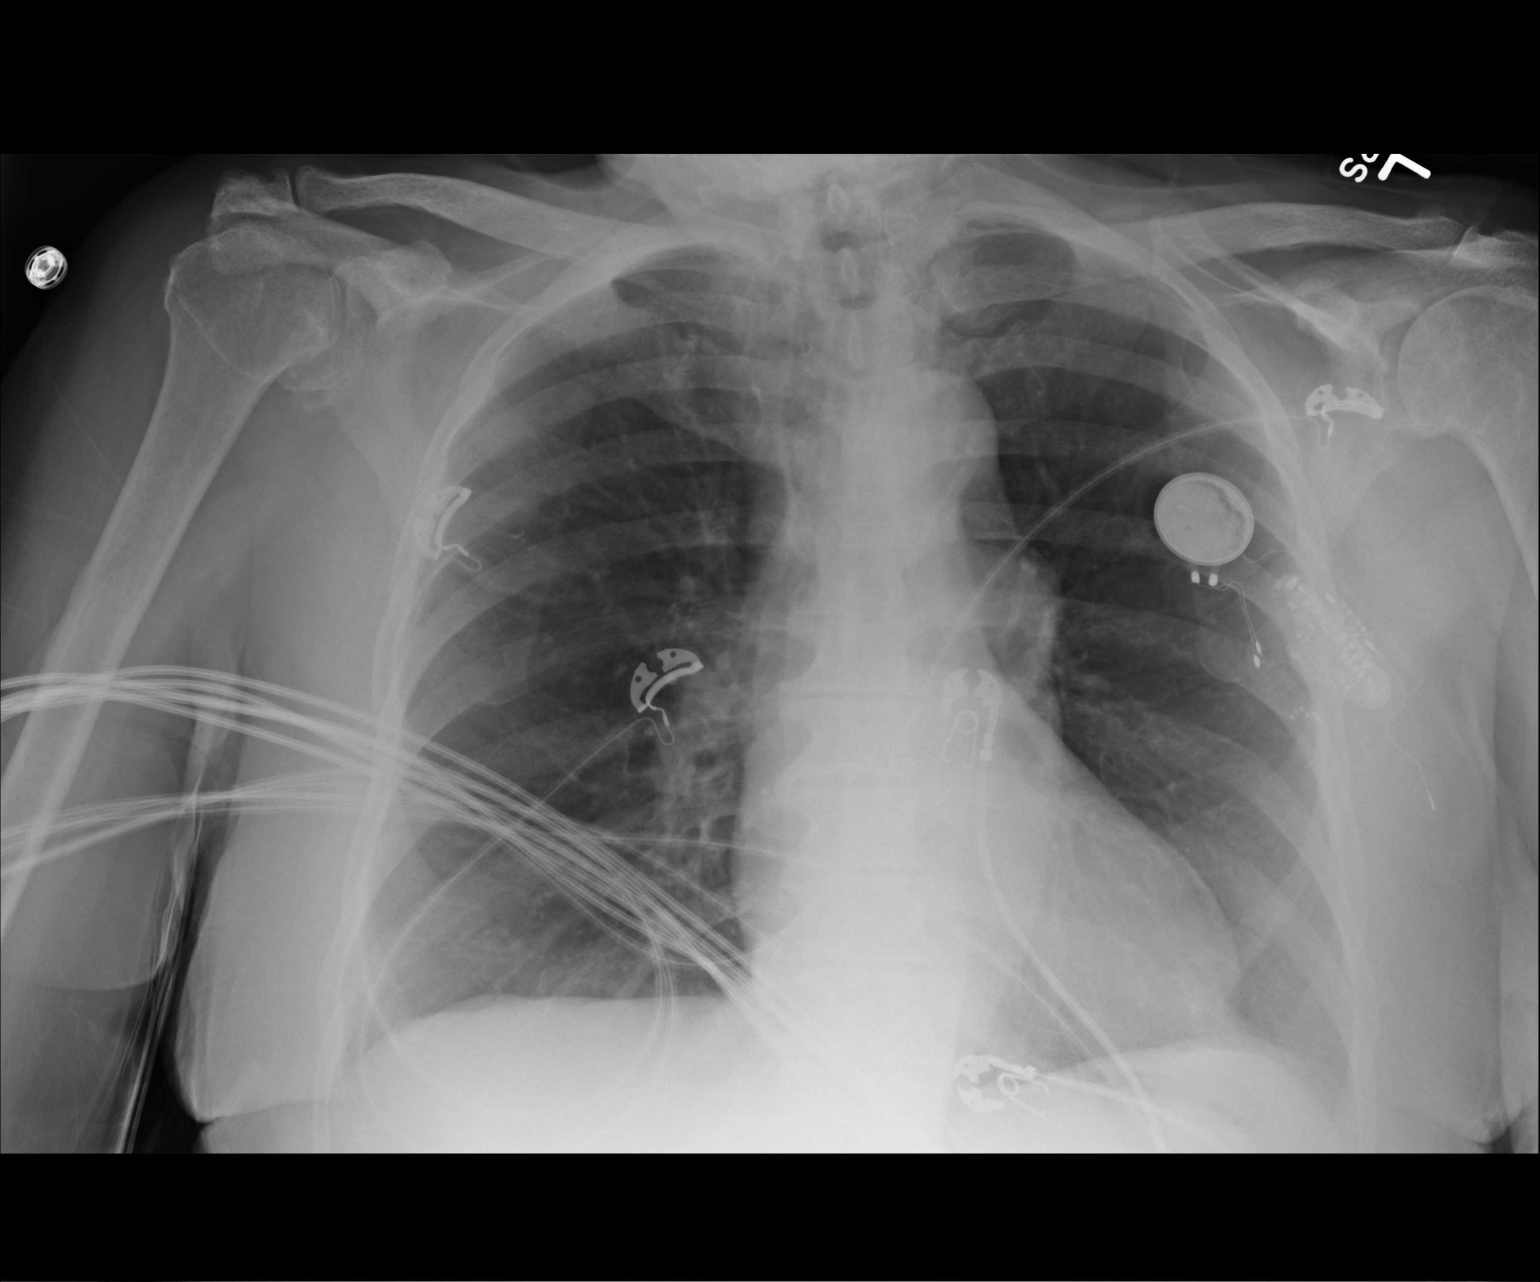

[1 of 1 positions shown; findings below may reference images not displayed]

FINDINGS: Normal heart size and pulmonary vascularity. No focal airspace
disease or consolidation in the lungs. No blunting of costophrenic
angles. No pneumothorax. Mediastinal contours appear intact.
Degenerative changes in the spine and shoulders. Calcified and
tortuous aorta.
IMPRESSION: No active disease.

## 2017-09-09 DIAGNOSIS — M4805 Spinal stenosis, thoracolumbar region: Secondary | ICD-10-CM | POA: Diagnosis not present

## 2017-09-09 DIAGNOSIS — M4698 Unspecified inflammatory spondylopathy, sacral and sacrococcygeal region: Secondary | ICD-10-CM | POA: Diagnosis not present

## 2017-09-09 DIAGNOSIS — M47812 Spondylosis without myelopathy or radiculopathy, cervical region: Secondary | ICD-10-CM | POA: Diagnosis not present

## 2017-09-09 DIAGNOSIS — M150 Primary generalized (osteo)arthritis: Secondary | ICD-10-CM | POA: Diagnosis not present

## 2017-09-09 DIAGNOSIS — M7072 Other bursitis of hip, left hip: Secondary | ICD-10-CM | POA: Diagnosis not present

## 2017-09-11 IMAGING — US IR ANGIO/ADD [PERSON_NAME]
1 series · 2 of 2 positions shown · non-contrast
Comparison: none

ADDENDUM:
The EXAM title should correctly read:

1. ULTRASOUND GUIDANCE FOR VASCULAR ACCESS OF THE RIGHT COMMON
FEMORAL ARTERY
2. SELECTIVE ARTERIOGRAPHY OF THE CELIAC AXIS
3. SELECTIVE ARTERIOGRAPHY OF THE SUPERIOR MESENTERIC ARTERY
4. ADDITIONAL SELECTIVE THIRD ORDER ARTERIOGRAPHY OF
PANCREATICODUODENAL SUPPLY ON OF SUPERIOR MESENTERIC ARTERY
5. ADDITIONAL THIRD ORDER PANCREATICODUODENAL SELECTIVE
ARTERIOGRAPHY
6. TRANSCATHETER EMBOLIZATION OF PANCREATICODUODENAL BRANCH ARTERIES
TO TREAT ARTERIAL HEMORRHAGE
7. FOLLOWUP ARTERIOGRAPHY AFTER EMBOLIZATION
CLINICAL DATA: Refractory upper GI bleed from bleeding duodenal
angiodysplasia with two prior endoscopic laser treatments. The
patient continues to have bleeding requiring transfusion and now
presents for arteriography with possible embolization to treat
persistent hemorrhage.

[Series 1: ir (id) (id)/(id)/(id) ir · 2 of 2 slices shown]
[im 1/2]
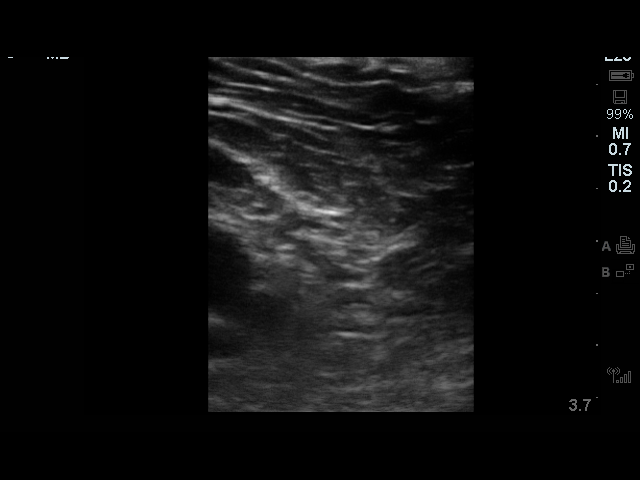
[im 2/2]
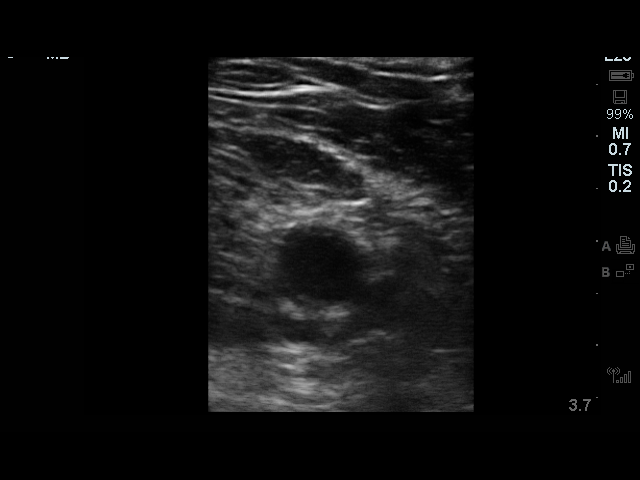

[2 of 2 positions shown; findings below may reference images not displayed]

EXAM:
UTERINE FIBROID EMBOLIZATION

ANESTHESIA/SEDATION:
1.5 Mg IV Versed; 75 mcg IV Fentanyl.

Total Moderate Sedation Time: 2 hours and 20 minutes.

The patient's level of consciousness and physiologic status were
continuously monitored during the procedure by Radiology nursing.

CONTRAST:  150 mL Jsovue-BWW

FLUOROSCOPY TIME:  58 minutes and 18 seconds.

PROCEDURE:
The procedure, risks, benefits, and alternatives were explained to
the patient. Questions regarding the procedure were encouraged and
answered. The patient understands and consents to the procedure. A
time-out was performed prior to initiating the procedure.

The right groin was prepped with chlorhexidine in a sterile fashion,
and a sterile drape was applied covering the operative field. A
sterile gown and sterile gloves were used for the procedure. Local
anesthesia was provided with 1% Lidocaine.

Ultrasound was used to confirm patency of the right common femoral
artery. After small skin incision, a 21 gauge needle was advanced
into the right common femoral artery under direct ultrasound
guidance. Ultrasound image documentation was performed. After
establishing guide wire access, a 5-French sheath was placed.

A 5 Fr Cobra catheter was advanced over a guidewire into the distal
abdominal aorta. The catheter was then used to selectively
catheterize the superior mesenteric artery. Selective arteriography
was performed.

The Cobra catheter was then advanced into the celiac axis and
selective arteriography performed.

The Cobra catheter was then reintroduced into the superior
mesenteric artery and advanced into the proximal SMA trunk.
Additional arteriography was performed. A Renegade STC micro
catheter was then advanced through the 5 French catheter and into a
third order branch off of the superior mesenteric artery at the
level of pancreaticoduodenal supply. Selective arteriography was
performed. Transcatheter embolization was then performed through the
micro catheter with introduction of 2 mm x 4 cm, 2 mm x 4 cm and 3
mm x 6 cm Interlock soft coils. Additional arteriography was
performed through the micro catheter after embolization of this
branch artery.

Attempt was made to catheterize an additional third order
pancreaticoduodenal branch off of the SMA with the same catheter
system. Ultimately, a 5 French Sos catheter was used to catheterize
the SMA trunk followed by advancement of a Lantern micro catheter.
After selective catheterization of a third order pancreaticoduodenal
branch, selective arteriography was performed. Transcatheter
embolization was then performed of this branch via the micro
catheter with introduction of 2 mm x 4 cm, 2 mm x 2 cm, 2 mm x 4 cm
and 3 mm x 5 cm Ruby soft coils.

Additional arteriography was performed through the micro catheter
after embolization. The micro catheter was further retracted and
additional arteriography performed at the level of a second order
trunk off of the SMA.

Catheters were removed. Oblique arteriography centered over the
femoral head was performed through the right femoral sheath prior to
use of a closure device.

Right femoral arteriotomy hemostasis: Cordis Exoseal

COMPLICATIONS:
None.
FINDINGS: Initial first order SMA arteriography demonstrates prominent
pancreaticoduodenal vessels off of the first second order trunk of
the SMA which extends to the regions of the second, third and fourth
portions of the duodenum. There are two endo clips in close
proximity to each other at the level of the third portion of the
duodenum. Focal areas of hypervascular blush are noted in the second
and third portions of the duodenum which correlate in location to
the described areas of angiodysplasia by prior endoscopy. Other SMA
supply is normal in appearance without evidence of additional
hypervascular abnormalities or AV malformations. Supply to the rest
of the small bowel and proximal colon appears normal.

Additional selective celiac arteriography demonstrates normal
anatomy with normally patent celiac trunk, a widely patent left
gastric artery and normally patent common hepatic and splenic
arteries. The gastroduodenal artery is the first major branch off of
the common hepatic artery and supplies branches to the region of the
duodenal bulb and distal stomach. No branches are seen extending to
the region of the endo clips and no hypervascular areas in the
duodenum are identified with celiac injection. For this reason,
embolization was targeted to the pancreaticoduodenal arcade off of
the superior mesenteric artery.

The pancreaticoduodenal trunk off of the SMA demonstrates a superior
branch as well as middle and inferior branches. Based on
arteriography, hypervascular areas of abnormality were predominantly
supplied by the superior and inferior branches. The inferior branch
was initially catheterized with selective arteriography
demonstrating subtle hypervascular abnormality near the 2 adjacent
endoscopic clips. This vessel was successfully embolized resulting
in occlusion of antegrade flow with proximal injection.

The superior pancreaticoduodenal branch supplying the region of the
second and third portions of the duodenum was extremely difficult to
catheterize due to tortuous course. Ultimately, different catheter
systems did allow selective catheterization and coil embolization
resulting in cessation of antegrade flow in this branch. Completion
arteriography demonstrates markedly reduced vascularity of the
duodenum with persistent patent supply via the middle
pancreaticoduodenal branch which supplies the region of the third
portion of the duodenum. Additional branches supplying the region of
the fourth portion of the duodenum also remained normally patent on
completion.

Adequate hemostasis was achieved at the femoral arteriotomy site.
IMPRESSION: The region of abnormal duodenal bleeding due to areas of
angiodysplasia visualized and treated by endoscopy and also marked
by endoscopic clips were supplied by branches of the
pancreaticoduodenal arcade off of the proximal SMA. The
gastroduodenal artery did not appear to supply this region by
arteriography. Two separate pancreaticoduodenal third order arterial
branches were successfully occluded with embolization coils placed
via micro catheters. This resulted [REDACTED]reased vascularity to the
second and third portions of the duodenum.

## 2017-09-12 ENCOUNTER — Encounter: Payer: Self-pay | Admitting: Family Medicine

## 2017-09-12 ENCOUNTER — Ambulatory Visit (INDEPENDENT_AMBULATORY_CARE_PROVIDER_SITE_OTHER): Payer: Medicare Other | Admitting: Family Medicine

## 2017-09-12 VITALS — BP 124/70 | HR 73 | Temp 98.0°F | Ht 59.0 in | Wt 156.0 lb

## 2017-09-12 DIAGNOSIS — M150 Primary generalized (osteo)arthritis: Secondary | ICD-10-CM | POA: Diagnosis not present

## 2017-09-12 DIAGNOSIS — M159 Polyosteoarthritis, unspecified: Secondary | ICD-10-CM

## 2017-09-12 DIAGNOSIS — M170 Bilateral primary osteoarthritis of knee: Secondary | ICD-10-CM

## 2017-09-12 DIAGNOSIS — G629 Polyneuropathy, unspecified: Secondary | ICD-10-CM | POA: Diagnosis not present

## 2017-09-12 DIAGNOSIS — G959 Disease of spinal cord, unspecified: Secondary | ICD-10-CM | POA: Diagnosis not present

## 2017-09-12 DIAGNOSIS — I10 Essential (primary) hypertension: Secondary | ICD-10-CM | POA: Diagnosis not present

## 2017-09-12 DIAGNOSIS — M48062 Spinal stenosis, lumbar region with neurogenic claudication: Secondary | ICD-10-CM | POA: Diagnosis not present

## 2017-09-12 DIAGNOSIS — Z79891 Long term (current) use of opiate analgesic: Secondary | ICD-10-CM

## 2017-09-12 DIAGNOSIS — M7071 Other bursitis of hip, right hip: Secondary | ICD-10-CM | POA: Diagnosis not present

## 2017-09-12 MED ORDER — HYDROCHLOROTHIAZIDE 25 MG PO TABS: 25.0000 mg | ORAL_TABLET | Freq: Every day | ORAL | 3 refills | Status: DC

## 2017-09-12 MED ORDER — OXYCODONE HCL 10 MG PO TABS: 20.0000 mg | ORAL_TABLET | Freq: Four times a day (QID) | ORAL | 0 refills | Status: DC | PRN

## 2017-09-12 NOTE — Patient Instructions (Signed)
I am glad that your pain is better enough for you to do your activities.  Try the TENS unit on the painful site in your hip.

## 2017-09-13 ENCOUNTER — Telehealth: Payer: Self-pay

## 2017-09-13 ENCOUNTER — Encounter: Payer: Self-pay | Admitting: Family Medicine

## 2017-09-13 DIAGNOSIS — M7071 Other bursitis of hip, right hip: Secondary | ICD-10-CM

## 2017-09-13 DIAGNOSIS — M47812 Spondylosis without myelopathy or radiculopathy, cervical region: Secondary | ICD-10-CM | POA: Diagnosis not present

## 2017-09-13 DIAGNOSIS — M150 Primary generalized (osteo)arthritis: Secondary | ICD-10-CM | POA: Diagnosis not present

## 2017-09-13 DIAGNOSIS — M7072 Other bursitis of hip, left hip: Secondary | ICD-10-CM | POA: Diagnosis not present

## 2017-09-13 DIAGNOSIS — M4698 Unspecified inflammatory spondylopathy, sacral and sacrococcygeal region: Secondary | ICD-10-CM | POA: Diagnosis not present

## 2017-09-13 DIAGNOSIS — M4805 Spinal stenosis, thoracolumbar region: Secondary | ICD-10-CM | POA: Diagnosis not present

## 2017-09-13 HISTORY — DX: Other bursitis of hip, right hip: M70.71

## 2017-09-13 NOTE — Telephone Encounter (Signed)
Patient needs an order to be put in for outpatient physical therapy.Nicole Perez

## 2017-09-13 NOTE — Progress Notes (Signed)
   Subjective:    Patient ID: Nicole Perez, female    DOB: January 11, 1933, 83 y.o.   MRN: [id number] Nicole Perez is accompanied by daughter, Nicole Perez Sources of clinical information for visit is/are patient and relative(s). Nursing assessment for this office visit was reviewed with the patient for accuracy and revision.  Depression screen PHQ 2/9 09/12/2017  Decreased Interest 0  Down, Depressed, Hopeless 0  PHQ - 2 Score 0  Some recent data might be hidden   Fall Risk  09/12/2017 08/22/2017 08/12/2017 07/25/2017 06/27/2017  Falls in the past year? No No No No No  Number falls in past yr: - - - - -  Injury with Fall? - - - - -  Risk Factor Category  - - - - -  Risk for fall due to : - - - - -  Risk for fall due to: Comment - - - - -    HPI Left Buttock pain - onset around 06/20/17 - course: slow improving. - Less pain - less radiation of pain down into left leg. .  - Relief: has returned to using Norco 10/325 two tablets for moderate pain, and using oxycodone 10 mg tablet one tablet for severe pain or pain unresponsive to Norco. - Denies dizziness, confusion, worsening constipation - Continues to be independent in all her iADLs and ADLs. Good appetite.   Review of Systems No fever No abdominal pain    Review of Systems     Objective:   Physical Exam  Physical Exam VS reviewed GEN: Alert, Cooperative, Groomed, NAD Back; Tender to mod palpation over left SI region. No masses nor deformities. Tender over left ishial tuberosity. No pain with resisted left hip flexion or int/ext rotation.  Gait: uising rolling walker, slight decrease in speed, No significant path deviation, Step through not present, leans forward onto walker.  Able to rise from chair unassisted other than using walker.  Psych: Alert, social, inquisitive, normal affect/thought/speech/language       Assessment & Plan:  30 minutes face to face where spent in total with counseling patient and family about risks of  opioids, possible multiple sites of origins of pain, both peripheral and central, both neuropathic and somatic. Discussed indications for spinal surgery if pain became unbearable.

## 2017-09-13 NOTE — Assessment & Plan Note (Signed)
Established problem. Stable. Able to perform her ADLs when takes Norco.  She manages severe breakthru pain in buttocks and legs with oxycodone.  Denies sedation, falls, confusion.  [name] confirms that her mother is her usual self.  Multiple possible origins of pain based on exam and pelvic MRI images: right iliopsoas bursitis, SI joint arthritis, severe lumbar stenosis with myelopathy.  Review of N[facility]SD[facility] showed no evidence of doctor shopping.  Will continue Norco 10/325 for moderate pain and Oxycodone 10 mg for severe pain or unresponsive pain in buttock.  Pt to continue home health PT and HEP. Start use of TENS unit to painful area of buttocks.

## 2017-09-13 NOTE — Telephone Encounter (Signed)
Does this order for physical therapy need to be put in EPIC as an order?

## 2017-09-17 DIAGNOSIS — M4698 Unspecified inflammatory spondylopathy, sacral and sacrococcygeal region: Secondary | ICD-10-CM | POA: Diagnosis not present

## 2017-09-17 DIAGNOSIS — M47812 Spondylosis without myelopathy or radiculopathy, cervical region: Secondary | ICD-10-CM | POA: Diagnosis not present

## 2017-09-17 DIAGNOSIS — M7072 Other bursitis of hip, left hip: Secondary | ICD-10-CM | POA: Diagnosis not present

## 2017-09-17 DIAGNOSIS — M150 Primary generalized (osteo)arthritis: Secondary | ICD-10-CM | POA: Diagnosis not present

## 2017-09-17 DIAGNOSIS — M4805 Spinal stenosis, thoracolumbar region: Secondary | ICD-10-CM | POA: Diagnosis not present

## 2017-09-20 ENCOUNTER — Ambulatory Visit: Payer: Medicare Other | Admitting: *Deleted

## 2017-09-23 ENCOUNTER — Telehealth: Payer: Self-pay | Admitting: *Deleted

## 2017-09-23 MED ORDER — CEPHALEXIN 500 MG PO CAPS: 500.0000 mg | ORAL_CAPSULE | Freq: Four times a day (QID) | ORAL | 0 refills | Status: AC

## 2017-09-23 NOTE — Telephone Encounter (Signed)
Patient called requesting antibiotics for burning with urination.  Patient has not sleep well in about one week to her symptoms. Please give her a call.  Derl Barrow, RN

## 2017-09-23 NOTE — Telephone Encounter (Signed)
Please let patient know that their prescription(s) for Antibiotics for her possible urinary tract infection sent to patient's pharmacy.

## 2017-09-23 NOTE — Telephone Encounter (Signed)
LM for patient that medication was sent to the pharmacy. Jazmin Hartsell,CMA

## 2017-09-23 NOTE — Telephone Encounter (Signed)
Pt informed. Kelley Polinsky Dawn, CMA  

## 2017-09-23 NOTE — Addendum Note (Signed)
Addended byWendy Poet, Anny Sayler D on: 09/23/2017 12:29 PM   Modules accepted: Orders

## 2017-10-02 ENCOUNTER — Ambulatory Visit (INDEPENDENT_AMBULATORY_CARE_PROVIDER_SITE_OTHER): Payer: Medicare Other | Admitting: *Deleted

## 2017-10-02 ENCOUNTER — Encounter: Payer: Self-pay | Admitting: *Deleted

## 2017-10-02 VITALS — BP 136/72 | HR 84 | Temp 98.5°F | Ht 59.0 in | Wt 154.4 lb

## 2017-10-02 DIAGNOSIS — R7303 Prediabetes: Secondary | ICD-10-CM | POA: Diagnosis not present

## 2017-10-02 DIAGNOSIS — Z0000 Encounter for general adult medical examination without abnormal findings: Secondary | ICD-10-CM

## 2017-10-02 LAB — POCT GLYCOSYLATED HEMOGLOBIN (HGB A1C): HEMOGLOBIN A1C: 5.5
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
## 2017-10-02 NOTE — Patient Instructions (Addendum)
[name],  Thank you for taking time to come for yourMedicare Wellness Visit. I appreciate your ongoing commitment to your health goals. Please review the following plan we discussed and let me know if I can assist you in the future.   These are the goals we discussed:  Goals    . LDL CALC < 130    . Maintain current level of physical activity          Keep Moving!        Fall Prevention in the Home Falls can cause injuries. They can happen to people of all ages. There are many things you can do to make your home safe and to help prevent falls. What can I do on the outside of my home?  Regularly fix the edges of walkways and driveways and fix any cracks.  Remove anything that might make you trip as you walk through a door, such as a raised step or threshold.  Trim any bushes or trees on the path to your home.  Use bright outdoor lighting.  Clear any walking paths of anything that might make someone trip, such as rocks or tools.  Regularly check to see if handrails are loose or broken. Make sure that both sides of any steps have handrails.  Any raised decks and porches should have guardrails on the edges.  Have any leaves, snow, or ice cleared regularly.  Use sand or salt on walking paths during winter.  Clean up any spills in your garage right away. This includes oil or grease spills. What can I do in the bathroom?  Use night lights.  Install grab bars by the toilet and in the tub and shower. Do not use towel bars as grab bars.  Use non-skid mats or decals in the tub or shower.  If you need to sit down in the shower, use a plastic, non-slip stool.  Keep the floor dry. Clean up any water that spills on the floor as soon as it happens.  Remove soap buildup in the tub or shower regularly.  Attach bath mats securely with double-sided non-slip rug tape.  Do not have throw rugs and other things on the floor that can make you trip. What can I do in the  bedroom?  Use night lights.  Make sure that you have a light by your bed that is easy to reach.  Do not use any sheets or blankets that are too big for your bed. They should not hang down onto the floor.  Have a firm chair that has side arms. You can use this for support while you get dressed.  Do not have throw rugs and other things on the floor that can make you trip. What can I do in the kitchen?  Clean up any spills right away.  Avoid walking on wet floors.  Keep items that you use a lot in easy-to-reach places.  If you need to reach something above you, use a strong step stool that has a grab bar.  Keep electrical cords out of the way.  Do not use floor polish or wax that makes floors slippery. If you must use wax, use non-skid floor wax.  Do not have throw rugs and other things on the floor that can make you trip. What can I do with my stairs?  Do not leave any items on the stairs.  Make sure that there are handrails on both sides of the stairs and use them. Fix handrails that  are broken or loose. Make sure that handrails are as long as the stairways.  Check any carpeting to make sure that it is firmly attached to the stairs. Fix any carpet that is loose or worn.  Avoid having throw rugs at the top or bottom of the stairs. If you do have throw rugs, attach them to the floor with carpet tape.  Make sure that you have a light switch at the top of the stairs and the bottom of the stairs. If you do not have them, ask someone to add them for you. What else can I do to help prevent falls?  Wear shoes that: ? Do not have high heels. ? Have rubber bottoms. ? Are comfortable and fit you well. ? Are closed at the toe. Do not wear sandals.  If you use a stepladder: ? Make sure that it is fully opened. Do not climb a closed stepladder. ? Make sure that both sides of the stepladder are locked into place. ? Ask someone to hold it for you, if possible.  Clearly mark and make  sure that you can see: ? Any grab bars or handrails. ? First and last steps. ? Where the edge of each step is.  Use tools that help you move around (mobility aids) if they are needed. These include: ? Canes. ? Walkers. ? Scooters. ? Crutches.  Turn on the lights when you go into a dark area. Replace any light bulbs as soon as they burn out.  Set up your furniture so you have a clear path. Avoid moving your furniture around.  If any of your floors are uneven, fix them.  If there are any pets around you, be aware of where they are.  Review your medicines with your doctor. Some medicines can make you feel dizzy. This can increase your chance of falling. Ask your doctor what other things that you can do to help prevent falls. This information is not intended to replace advice given to you by your health care provider. Make sure you discuss any questions you have with your health care provider. Document Released: 09/29/2009 Document Revised: 05/10/2016 Document Reviewed: 01/07/2015 Elsevier Interactive Patient Education  2018 Jud Maintenance, Female Adopting a healthy lifestyle and getting preventive care can go a long way to promote health and wellness. Talk with your health care provider about what schedule of regular examinations is right for you. This is a good chance for you to check in with your provider about disease prevention and staying healthy. In between checkups, there are plenty of things you can do on your own. Experts have done a lot of research about which lifestyle changes and preventive measures are most likely to keep you healthy. Ask your health care provider for more information. Weight and diet Eat a healthy diet  Be sure to include plenty of vegetables, fruits, low-fat dairy products, and lean protein.  Do not eat a lot of foods high in solid fats, added sugars, or salt.  Get regular exercise. This is one of the most important things you can do  for your health. ? Most adults should exercise for at least 150 minutes each week. The exercise should increase your heart rate and make you sweat (moderate-intensity exercise). ? Most adults should also do strengthening exercises at least twice a week. This is in addition to the moderate-intensity exercise.  Maintain a healthy weight  Body mass index (BMI) is a measurement that can be used to identify  possible weight problems. It estimates body fat based on height and weight. Your health care provider can help determine your BMI and help you achieve or maintain a healthy weight.  For females 81 years of age and older: ? A BMI below 18.5 is considered underweight. ? A BMI of 18.5 to 24.9 is normal. ? A BMI of 25 to 29.9 is considered overweight. ? A BMI of 30 and above is considered obese.  Watch levels of cholesterol and blood lipids  You should start having your blood tested for lipids and cholesterol at 81 years of age, then have this test every 5 years.  You may need to have your cholesterol levels checked more often if: ? Your lipid or cholesterol levels are high. ? You are older than 81 years of age. ? You are at high risk for heart disease.  Cancer screening Lung Cancer  Lung cancer screening is recommended for adults 72-2 years old who are at high risk for lung cancer because of a history of smoking.  A yearly low-dose CT scan of the lungs is recommended for people who: ? Currently smoke. ? Have quit within the past 15 years. ? Have at least a 30-pack-year history of smoking. A pack year is smoking an average of one pack of cigarettes a day for 1 year.  Yearly screening should continue until it has been 15 years since you quit.  Yearly screening should stop if you develop a health problem that would prevent you from having lung cancer treatment.  Breast Cancer  Practice breast self-awareness. This means understanding how your breasts normally appear and feel.  It  also means doing regular breast self-exams. Let your health care provider know about any changes, no matter how small.  If you are in your 20s or 30s, you should have a clinical breast exam (CBE) by a health care provider every 1-3 years as part of a regular health exam.  If you are 61 or older, have a CBE every year. Also consider having a breast X-ray (mammogram) every year.  If you have a family history of breast cancer, talk to your health care provider about genetic screening.  If you are at high risk for breast cancer, talk to your health care provider about having an MRI and a mammogram every year.  Breast cancer gene (BRCA) assessment is recommended for women who have family members with BRCA-related cancers. BRCA-related cancers include: ? Breast. ? Ovarian. ? Tubal. ? Peritoneal cancers.  Results of the assessment will determine the need for genetic counseling and BRCA1 and BRCA2 testing.  Cervical Cancer Your health care provider may recommend that you be screened regularly for cancer of the pelvic organs (ovaries, uterus, and vagina). This screening involves a pelvic examination, including checking for microscopic changes to the surface of your cervix (Pap test). You may be encouraged to have this screening done every 3 years, beginning at age 36.  For women ages 50-65, health care providers may recommend pelvic exams and Pap testing every 3 years, or they may recommend the Pap and pelvic exam, combined with testing for human papilloma virus (HPV), every 5 years. Some types of HPV increase your risk of cervical cancer. Testing for HPV may also be done on women of any age with unclear Pap test results.  Other health care providers may not recommend any screening for nonpregnant women who are considered low risk for pelvic cancer and who do not have symptoms. Ask your health care provider  if a screening pelvic exam is right for you.  If you have had past treatment for cervical  cancer or a condition that could lead to cancer, you need Pap tests and screening for cancer for at least 20 years after your treatment. If Pap tests have been discontinued, your risk factors (such as having a new sexual partner) need to be reassessed to determine if screening should resume. Some women have medical problems that increase the chance of getting cervical cancer. In these cases, your health care provider may recommend more frequent screening and Pap tests.  Colorectal Cancer  This type of cancer can be detected and often prevented.  Routine colorectal cancer screening usually begins at 81 years of age and continues through 81 years of age.  Your health care provider may recommend screening at an earlier age if you have risk factors for colon cancer.  Your health care provider may also recommend using home test kits to check for hidden blood in the stool.  A small camera at the end of a tube can be used to examine your colon directly (sigmoidoscopy or colonoscopy). This is done to check for the earliest forms of colorectal cancer.  Routine screening usually begins at age 58.  Direct examination of the colon should be repeated every 5-10 years through 81 years of age. However, you may need to be screened more often if early forms of precancerous polyps or small growths are found.  Skin Cancer  Check your skin from head to toe regularly.  Tell your health care provider about any new moles or changes in moles, especially if there is a change in a mole's shape or color.  Also tell your health care provider if you have a mole that is larger than the size of a pencil eraser.  Always use sunscreen. Apply sunscreen liberally and repeatedly throughout the day.  Protect yourself by wearing long sleeves, pants, a wide-brimmed hat, and sunglasses whenever you are outside.  Heart disease, diabetes, and high blood pressure  High blood pressure causes heart disease and increases the risk  of stroke. High blood pressure is more likely to develop in: ? People who have blood pressure in the high end of the normal range (130-139/85-89 mm Hg). ? People who are overweight or obese. ? People who are African American.  If you are 69-40 years of age, have your blood pressure checked every 3-5 years. If you are 14 years of age or older, have your blood pressure checked every year. You should have your blood pressure measured twice-once when you are at a hospital or clinic, and once when you are not at a hospital or clinic. Record the average of the two measurements. To check your blood pressure when you are not at a hospital or clinic, you can use: ? An automated blood pressure machine at a pharmacy. ? A home blood pressure monitor.  If you are between 50 years and 66 years old, ask your health care provider if you should take aspirin to prevent strokes.  Have regular diabetes screenings. This involves taking a blood sample to check your fasting blood sugar level. ? If you are at a normal weight and have a low risk for diabetes, have this test once every three years after 81 years of age. ? If you are overweight and have a high risk for diabetes, consider being tested at a younger age or more often. Preventing infection Hepatitis B  If you have a higher risk for  hepatitis B, you should be screened for this virus. You are considered at high risk for hepatitis B if: ? You were born in a country where hepatitis B is common. Ask your health care provider which countries are considered high risk. ? Your parents were born in a high-risk country, and you have not been immunized against hepatitis B (hepatitis B vaccine). ? You have HIV or AIDS. ? You use needles to inject street drugs. ? You live with someone who has hepatitis B. ? You have had sex with someone who has hepatitis B. ? You get hemodialysis treatment. ? You take certain medicines for conditions, including cancer, organ  transplantation, and autoimmune conditions.  Hepatitis C  Blood testing is recommended for: ? Everyone born from [birth year] through 1963. ? Anyone with known risk factors for hepatitis C.  Sexually transmitted infections (STIs)  You should be screened for sexually transmitted infections (STIs) including gonorrhea and chlamydia if: ? You are sexually active and are younger than 81 years of age. ? You are older than 81 years of age and your health care provider tells you that you are at risk for this type of infection. ? Your sexual activity has changed since you were last screened and you are at an increased risk for chlamydia or gonorrhea. Ask your health care provider if you are at risk.  If you do not have HIV, but are at risk, it may be recommended that you take a prescription medicine daily to prevent HIV infection. This is called pre-exposure prophylaxis (PrEP). You are considered at risk if: ? You are sexually active and do not regularly use condoms or know the HIV status of your partner(s). ? You take drugs by injection. ? You are sexually active with a partner who has HIV.  Talk with your health care provider about whether you are at high risk of being infected with HIV. If you choose to begin PrEP, you should first be tested for HIV. You should then be tested every 3 months for as long as you are taking PrEP. Pregnancy  If you are premenopausal and you may become pregnant, ask your health care provider about preconception counseling.  If you may become pregnant, take 400 to 800 micrograms (mcg) of folic acid every day.  If you want to prevent pregnancy, talk to your health care provider about birth control (contraception). Osteoporosis and menopause  Osteoporosis is a disease in which the bones lose minerals and strength with aging. This can result in serious bone fractures. Your risk for osteoporosis can be identified using a bone density scan.  If you are 42 years of age or  older, or if you are at risk for osteoporosis and fractures, ask your health care provider if you should be screened.  Ask your health care provider whether you should take a calcium or vitamin D supplement to lower your risk for osteoporosis.  Menopause may have certain physical symptoms and risks.  Hormone replacement therapy may reduce some of these symptoms and risks. Talk to your health care provider about whether hormone replacement therapy is right for you. Follow these instructions at home:  Schedule regular health, dental, and eye exams.  Stay current with your immunizations.  Do not use any tobacco products including cigarettes, chewing tobacco, or electronic cigarettes.  If you are pregnant, do not drink alcohol.  If you are breastfeeding, limit how much and how often you drink alcohol.  Limit alcohol intake to no more than 1 drink  per day for nonpregnant women. One drink equals 12 ounces of beer, 5 ounces of wine, or 1 ounces of hard liquor.  Do not use street drugs.  Do not share needles.  Ask your health care provider for help if you need support or information about quitting drugs.  Tell your health care provider if you often feel depressed.  Tell your health care provider if you have ever been abused or do not feel safe at home. This information is not intended to replace advice given to you by your health care provider. Make sure you discuss any questions you have with your health care provider. Document Released: 06/18/2011 Document Revised: 05/10/2016 Document Reviewed: 09/06/2015 Elsevier Interactive Patient Education  [employer].

## 2017-10-04 ENCOUNTER — Encounter: Payer: Self-pay | Admitting: *Deleted

## 2017-10-04 NOTE — Progress Notes (Signed)
I have reviewed this visit and discussed with Lauren Ducatte, RN, BSN, and agree with her documentation.   

## 2017-10-22 ENCOUNTER — Other Ambulatory Visit: Payer: Self-pay | Admitting: *Deleted

## 2017-10-23 ENCOUNTER — Telehealth: Payer: Self-pay

## 2017-10-23 MED ORDER — OXYCODONE HCL 10 MG PO TABS: 20.0000 mg | ORAL_TABLET | Freq: Four times a day (QID) | ORAL | 0 refills | Status: DC | PRN

## 2017-10-23 NOTE — Telephone Encounter (Signed)
Pt contacted and informed of prescriptions ready for pick up at [facility]. Pt voiced understanding.

## 2017-10-23 NOTE — Telephone Encounter (Signed)
-----   Message from Blane Ohara McDiarmid, MD sent at 10/23/2017  8:46 AM EST ----- Regarding: Pick up Rx Please let patient know her prescription(s) are available for pick up from the [facility] front desk.

## 2017-11-06 ENCOUNTER — Other Ambulatory Visit: Payer: Self-pay | Admitting: *Deleted

## 2017-11-06 NOTE — Telephone Encounter (Signed)
[name] left message on nurse line requesting refill on oxycodone. States pt is completely out. Has first available appt with PCP on 11/28/2017. Hubbard Hartshorn, RN, BSN

## 2017-11-11 ENCOUNTER — Other Ambulatory Visit: Payer: Self-pay | Admitting: Family Medicine

## 2017-11-11 MED ORDER — OXYCODONE HCL 10 MG PO TABS: 20.0000 mg | ORAL_TABLET | Freq: Four times a day (QID) | ORAL | 0 refills | Status: DC | PRN

## 2017-11-11 NOTE — Telephone Encounter (Signed)
Please let patient know her prescription(s) are available for pick up from the [facility] front desk.  

## 2017-11-11 NOTE — Telephone Encounter (Signed)
LM for patient that script is ready for pick up. Jazmin Hartsell,CMA  

## 2017-11-11 NOTE — Telephone Encounter (Signed)
Second request. Patient and [name] both left messages on nurse line today requesting refill on "pain med". Patient has been out X 2 days. Hubbard Hartshorn, RN, BSN

## 2017-11-14 ENCOUNTER — Other Ambulatory Visit: Payer: Self-pay | Admitting: Family Medicine

## 2017-11-14 MED ORDER — HYDROCODONE-ACETAMINOPHEN 10-325 MG PO TABS: 1.0000 | ORAL_TABLET | Freq: Three times a day (TID) | ORAL | 0 refills | Status: DC | PRN

## 2017-11-14 NOTE — Telephone Encounter (Signed)
Called patient to follow up and got no answer.Nicole Perez

## 2017-11-16 DIAGNOSIS — M7072 Other bursitis of hip, left hip: Secondary | ICD-10-CM

## 2017-11-16 HISTORY — DX: Other bursitis of hip, left hip: M70.72

## 2017-11-28 ENCOUNTER — Encounter: Payer: Self-pay | Admitting: Family Medicine

## 2017-11-28 ENCOUNTER — Ambulatory Visit (INDEPENDENT_AMBULATORY_CARE_PROVIDER_SITE_OTHER): Payer: Medicare Other | Admitting: Family Medicine

## 2017-11-28 ENCOUNTER — Other Ambulatory Visit: Payer: Self-pay

## 2017-11-28 VITALS — BP 118/58 | HR 74 | Temp 98.0°F | Ht 59.0 in | Wt 155.0 lb

## 2017-11-28 DIAGNOSIS — M4715 Other spondylosis with myelopathy, thoracolumbar region: Secondary | ICD-10-CM

## 2017-11-28 DIAGNOSIS — D500 Iron deficiency anemia secondary to blood loss (chronic): Secondary | ICD-10-CM

## 2017-11-28 DIAGNOSIS — I10 Essential (primary) hypertension: Secondary | ICD-10-CM

## 2017-11-28 DIAGNOSIS — N3000 Acute cystitis without hematuria: Secondary | ICD-10-CM | POA: Diagnosis not present

## 2017-11-28 DIAGNOSIS — G959 Disease of spinal cord, unspecified: Secondary | ICD-10-CM

## 2017-11-28 MED ORDER — OXYCODONE HCL 10 MG PO TABS: 10.0000 mg | ORAL_TABLET | Freq: Two times a day (BID) | ORAL | 0 refills | Status: DC | PRN

## 2017-11-28 MED ORDER — CEPHALEXIN 500 MG PO CAPS: 500.0000 mg | ORAL_CAPSULE | Freq: Four times a day (QID) | ORAL | 0 refills | Status: DC

## 2017-11-28 MED ORDER — HYDROCODONE-ACETAMINOPHEN 10-325 MG PO TABS: 1.0000 | ORAL_TABLET | Freq: Four times a day (QID) | ORAL | 0 refills | Status: DC | PRN

## 2017-11-28 NOTE — Patient Instructions (Signed)
Start Cephazolin antibiotic for urinary tract infection. Take one tablet four times a day for 7 days.

## 2017-11-28 NOTE — Assessment & Plan Note (Signed)
New problem Clinical diagnosis Keflex 500 mg QID x 7 d

## 2017-11-28 NOTE — Assessment & Plan Note (Addendum)
Check CBC and ferritin 

## 2017-11-28 NOTE — Assessment & Plan Note (Signed)
Established problem. Stable. Able to perform her ADLs when takes Norco.  She manages severe breakthru pain in buttocks and legs with oxycodone.  Denies sedation, falls, confusion.  [name] confirms that her mother is her usual self.  Multiple possible origins of pain based on exam and pelvic MRI images: right iliopsoas bursitis, SI joint arthritis, severe lumbar stenosis with myelopathy.  Review of NCCSDB showed no evidence of doctor shopping.  Will continue Norco 10/325 for moderate pain and Oxycodone 10 mg for severe pain or unresponsive pain in buttock.  Pt to continue HEP. Start use of TENS unit to painful area of buttocks.

## 2017-11-28 NOTE — Assessment & Plan Note (Signed)
Adequate blood pressure control.  No evidence of new end organ damage.  Tolerating medication without significant adverse effects.  Plan to continue current blood pressure regiment.   

## 2017-11-28 NOTE — Progress Notes (Signed)
Subjective:    Patient ID: Nicole Perez, female    DOB: 1933/12/10, 83 y.o.   MRN: [id number] Nicole Perez is accompanied by daughter Sources of clinical information for visit is/are patient, relative(s) and past medical records. Nursing assessment for this office visit was reviewed with the patient for accuracy and revision.  Depression screen PHQ 2/9 11/28/2017  Decreased Interest 0  Down, Depressed, Hopeless 0  PHQ - 2 Score 0  Some recent data might be hidden   Fall Risk  11/28/2017 10/02/2017 09/12/2017 08/22/2017 08/12/2017  Falls in the past year? No No No No No  Number falls in past yr: - - - - -  Injury with Fall? - - - - -  Risk Factor Category  - - - - -  Risk for fall due to : - - - - -  Risk for fall due to: Comment - - - - -    HPI Problem List Items Addressed This Visit      High   Myelopathy of lumbar region [facility]) - onset 2011 HPI Uses Norco 10 as well as Oxycodone 10  which does help  ADE with analgesic therapies: constipation controlled by castor oil Non-allopathic therapites: TENS unit   Pain Inventory General activity: Able to perform ADLs independently Relation with others: good Enjoyment of life: yes What TIME of day is your pain at its worst? night Sleep (in general): DFA  Pain is worse with: prolonged exertion Pain improves with: rest and pain medication Relief from Meds: 50 (percentage relief)  Mobility walk without assistance: no use a cane/walker: rolling walker ability to climb steps?: no do you drive?:  no Difficulty driving with use of pain medications?: no  Function Employed (Y/N): no  date last employed: retired    Neuro/Psych Prolonged sadness / depressed mood: no Anxiety: no       Medium   Essential hypertension, benign (Chronic) CHRONIC HYPERTENSION  Disease Monitoring  Blood pressure range: not checking at home  Chest pain: no   Dyspnea: no   Claudication: no   Medication compliance: yes  Medication Side  Effects  Lightheadedness: no   Urinary frequency: no   Edema: no        History of Iron deficiency anemia due to chronic blood loss - Primary - History arterial bleeding stomach ulcer. - denies abdominal pain, n/v, melenca and BRBPR - No lightheadedness   Relevant Orders   CBC   Ferritin     Low   Osteoarthritis of spine with myelopathy, thoracolumbar region - severe spinal stenosis and foraminal stenosis with involvement of spinal cord and roots. - Not a surgical candidate per Dr Berlin Hun (Ortho-spine) unless emergency. - Able to feel feet.  No new weakness in legs. Able to rise from chair and transfer to rolling walker without assistance.    Relevant Medications   HYDROcodone-acetaminophen (NORCO) 10-325 MG tablet   Oxycodone HCl 10 MG TABS     Unprioritized   UTI (urinary tract infection) - Self-catheterizes for neurogenic bladder. - suprapubic discomfort is new with dysuria from residual urine that comes out urethra - No back pain, no fever/chills.  (+) good appetite.    Relevant Medications   cephALEXin (KEFLEX) 500 MG capsule     SH: no smiking    Review of Systems    see hpi Objective:   Physical Exam  VS reviewed GEN: Alert, Cooperative, Groomed, NAD HEENT: PERRL; EAC bilaterally not occluded, TM's translucent with normal LM, (+)  LR; COR: RRR, No M/G/R, No JVD, Normal PMI size and location LUNGS: No Acc mm use, speaking in full sentences ABDOMEN: (+)BS, soft, NT, ND EXT: trace bilateral pretibial  leg edema. Gait: slower gait speed with rolling walker, No significant path deviation, No step thru  Psych: Normal affect/thought/speech/language   Assessment & Plan:

## 2017-11-29 ENCOUNTER — Encounter: Payer: Self-pay | Admitting: Family Medicine

## 2017-11-29 LAB — CBC
Hematocrit: 34.8 % (ref 34.0–46.6)
Hemoglobin: 11.3 g/dL (ref 11.1–15.9)
MCH: 28.8 pg (ref 26.6–33.0)
MCHC: 32.5 g/dL (ref 31.5–35.7)
MCV: 89 fL (ref 79–97)
PLATELETS: 215 10*3/uL (ref 150–379)
RBC: 3.92 x10E6/uL (ref 3.77–5.28)
RDW: 14.4 % (ref 12.3–15.4)
WBC: 4.1 10*3/uL (ref 3.4–10.8)

## 2017-11-29 LAB — FERRITIN: FERRITIN: 241 ng/mL — AB (ref 15–150)

## 2017-12-25 ENCOUNTER — Telehealth: Payer: Self-pay | Admitting: *Deleted

## 2017-12-25 DIAGNOSIS — N3000 Acute cystitis without hematuria: Secondary | ICD-10-CM

## 2017-12-25 NOTE — Telephone Encounter (Signed)
Pt lm on nurse line wanting Dr. McDiarmid to "call in something for her legs"  Called back x 3, each time someone picked up and then hung up the phone.   Will forward to MD. Tannen Vandezande, [name], [place]

## 2017-12-25 NOTE — Telephone Encounter (Signed)
Patient left message on nurse line requesting additional pain med. States from waist to feet is throbbing. Legs and feet hurt "really bad." Current pain med not helping. Hubbard Hartshorn, RN, BSN

## 2017-12-26 ENCOUNTER — Ambulatory Visit (INDEPENDENT_AMBULATORY_CARE_PROVIDER_SITE_OTHER): Payer: Medicare Other | Admitting: Family Medicine

## 2017-12-26 ENCOUNTER — Other Ambulatory Visit: Payer: Self-pay

## 2017-12-26 VITALS — BP 140/86 | HR 77 | Temp 98.6°F | Ht 59.0 in | Wt 154.4 lb

## 2017-12-26 DIAGNOSIS — R300 Dysuria: Secondary | ICD-10-CM

## 2017-12-26 DIAGNOSIS — N3000 Acute cystitis without hematuria: Secondary | ICD-10-CM | POA: Diagnosis not present

## 2017-12-26 LAB — POCT URINALYSIS DIP (MANUAL ENTRY)
Bilirubin, UA: NEGATIVE
Glucose, UA: NEGATIVE mg/dL
Ketones, POC UA: NEGATIVE mg/dL
NITRITE UA: POSITIVE — AB
PH UA: 6 (ref 5.0–8.0)
Protein Ur, POC: NEGATIVE mg/dL
Spec Grav, UA: <=1.005 — AB (ref 1.010–1.025)
Urobilinogen, UA: 0.2 E.U./dL

## 2017-12-26 LAB — POCT UA - MICROSCOPIC ONLY: WBC, Ur, HPF, POC: >20

## 2017-12-26 MED ORDER — CEPHALEXIN 500 MG PO CAPS: 500.0000 mg | ORAL_CAPSULE | Freq: Four times a day (QID) | ORAL | 0 refills | Status: DC

## 2017-12-26 NOTE — Patient Instructions (Signed)
It was a pleasure to see you today! Thank you for choosing [facility] for your primary care. Nicole Perez was seen for urinary tract infection.   Our plans for today were:  I will send the urine to the lab to see which bacteria are growing. We may need to add another antibiotic if there are multiple bacteria. I will call you if so. Call or go to the ED if you are getting worse, having fevers, or feeling unwell.   Call Dr. McDiarmid for your pain medicine refill.   Best,  Dr. Lindell Noe

## 2017-12-26 NOTE — Addendum Note (Signed)
Addended by: Maryland Pink on: 12/26/2017 03:56 PM   Modules accepted: Orders

## 2017-12-26 NOTE — Telephone Encounter (Signed)
LM for patient ok per DPR. Naquita Nappier,CMA  

## 2017-12-26 NOTE — Progress Notes (Signed)
   CC: Same day burning with urination  HPI Urine burning since the few days after christmas. Finished keflex  prescribed earlier in December. Noticed odor in urine. No change with self catherization routine. In the past red pill has resolved UTIs. No fevers. This "new" infection has been going on for 2 weeks. No culture on 11/28/17.  Denies feeling systemically ill.  Notes a bad odor to her urine, as well as burning.  She has chronic lower extremity pain, described as throbbing.  She has a pain contract for oxycodone related to this.  She notes some worsening in her leg throbbing over the past couple of weeks.  She feels legs throbbing is related to keflex. No falls or injury. Has 10 in one bottle left and 7 in the other.    ROS: Denies chest pain, shortness of breath, endorses suprapubic abdominal pain, denies changes in bowel movements or rash.  CC, SH/smoking status, and VS noted  Objective: BP 140/86 (BP Location: Left Arm, Patient Position: Sitting, Cuff Size: Large)   Pulse 77   Temp 98.6 F (37 C) (Oral)   Ht 4\' 11"  (1.499 m)   Wt 154 lb 6.4 oz (70 kg)   LMP 12/17/1962   SpO2 96%   BMI 31.19 kg/m  Gen: NAD, alert, cooperative, and pleasant. HEENT: NCAT, EOMI, PERRL CV: RRR, no murmur Resp: CTAB, no wheezes, non-labored Abd: SNTND, BS present, no guarding or organomegaly, no suprapubic pain on exam Ext: No edema, warm, nontender to palpation Neuro: Alert and oriented, Speech clear, No gross deficits  Assessment and plan:  UTI (urinary tract infection) Presumed UTI based on nitrites and leukocytes present on UA from home collected urine.  Last urine culture in May with E. coli and Klebsiella with mixed resistance.  Should have been covered by Keflex.  Offered patient either Keflex or Keflex and Bactrim to possibly cover resistant Klebsiella.  Patient elected for Keflex alone, will call with culture results if need to change antibiotics.  Reassured patient that I do not suspect  her leg pain is worsened by Keflex.  Of note, obtained new urine culture sterilely in clinic.   Orders Placed This Encounter  Procedures  . Urine Culture  . POCT urinalysis dipstick    Meds ordered this encounter  Medications  . cephALEXin (KEFLEX) 500 MG capsule    Sig: Take 1 capsule (500 mg total) by mouth 4 (four) times daily.    Dispense:  28 capsule    Refill:  0   Patient will call Dr. Vikki Ports for her chronic pain refills in 3 days.  Ralene Ok, MD, PGY2 12/26/2017 3:06 PM

## 2017-12-26 NOTE — Telephone Encounter (Signed)
Please let Nicole Perez know that we called in Keflex antibiotic for her possible bladder infection.  Take the antibiotic four times a day for 10 days.   Call office or go to urgent care if not better within 48 hours of starting antibiotic

## 2017-12-26 NOTE — Assessment & Plan Note (Signed)
Presumed UTI based on nitrites and leukocytes present on UA from home collected urine.  Last urine culture in May with E. coli and Klebsiella with mixed resistance.  Should have been covered by Keflex.  Offered patient either Keflex or Keflex and Bactrim to possibly cover resistant Klebsiella.  Patient elected for Keflex alone, will call with culture results if need to change antibiotics.  Reassured patient that I do not suspect her leg pain is worsened by Keflex.  Of note, obtained new urine culture sterilely in clinic.

## 2017-12-26 NOTE — Telephone Encounter (Signed)
Patient left message on nurse line stating her body is hurting and her urine burns so much she can't walk. Attempted to reach patient to schedule same day appt. Line went directly to VM. Left message requesting she return call and schedule appt. If patient calls back please place in overflow if no appts available. Thank you. Hubbard Hartshorn, RN, BSN

## 2017-12-27 ENCOUNTER — Other Ambulatory Visit: Payer: Self-pay | Admitting: *Deleted

## 2017-12-27 ENCOUNTER — Telehealth: Payer: Self-pay | Admitting: *Deleted

## 2017-12-27 DIAGNOSIS — M4715 Other spondylosis with myelopathy, thoracolumbar region: Secondary | ICD-10-CM

## 2017-12-27 DIAGNOSIS — Z79891 Long term (current) use of opiate analgesic: Secondary | ICD-10-CM

## 2017-12-27 DIAGNOSIS — M48062 Spinal stenosis, lumbar region with neurogenic claudication: Secondary | ICD-10-CM

## 2017-12-27 DIAGNOSIS — M159 Polyosteoarthritis, unspecified: Secondary | ICD-10-CM

## 2017-12-27 DIAGNOSIS — M160 Bilateral primary osteoarthritis of hip: Secondary | ICD-10-CM

## 2017-12-27 DIAGNOSIS — M150 Primary generalized (osteo)arthritis: Secondary | ICD-10-CM

## 2017-12-27 DIAGNOSIS — N3000 Acute cystitis without hematuria: Secondary | ICD-10-CM

## 2017-12-27 DIAGNOSIS — M4804 Spinal stenosis, thoracic region: Secondary | ICD-10-CM

## 2017-12-27 MED ORDER — CIPROFLOXACIN HCL 100 MG PO TABS: 300.0000 mg | ORAL_TABLET | Freq: Two times a day (BID) | ORAL | 0 refills | Status: AC

## 2017-12-27 NOTE — Telephone Encounter (Signed)
Daughter informed. Jazmin Hartsell,CMA  

## 2017-12-27 NOTE — Telephone Encounter (Signed)
ase let patient know that Ciprofloxacin antibiotic for her bladder infection was sent in to her pharmacy.

## 2017-12-27 NOTE — Telephone Encounter (Signed)
Pt caregiver calling in about pt needing a refill on her hydrocodone. Pl;ease advise. Deseree Kennon Holter, CMA

## 2017-12-27 NOTE — Telephone Encounter (Signed)
Pt's daughter lm on nurse line.  She was given keflex yesterday and it "makes her sick".  Asa Lente is asking that Dr. McDiarmid call in something else for her. Pepper Wyndham, [name], CMA

## 2017-12-30 LAB — URINE CULTURE

## 2017-12-30 MED ORDER — HYDROCODONE-ACETAMINOPHEN 10-325 MG PO TABS: 1.0000 | ORAL_TABLET | Freq: Four times a day (QID) | ORAL | 0 refills | Status: DC | PRN

## 2017-12-30 NOTE — Telephone Encounter (Signed)
LM for patient letting her know that script was sent to pharmacy. Braydn Carneiro,CMA

## 2017-12-30 NOTE — Telephone Encounter (Signed)
Please let patient know their prescription has been sent to their pharmacy of record.

## 2017-12-31 DIAGNOSIS — H04551 Acquired stenosis of right nasolacrimal duct: Secondary | ICD-10-CM | POA: Diagnosis not present

## 2017-12-31 DIAGNOSIS — T1511XA Foreign body in conjunctival sac, right eye, initial encounter: Secondary | ICD-10-CM | POA: Diagnosis not present

## 2018-01-06 DIAGNOSIS — R339 Retention of urine, unspecified: Secondary | ICD-10-CM | POA: Diagnosis not present

## 2018-01-07 ENCOUNTER — Encounter (HOSPITAL_COMMUNITY): Payer: Self-pay | Admitting: Emergency Medicine

## 2018-01-07 ENCOUNTER — Other Ambulatory Visit: Payer: Self-pay

## 2018-01-07 ENCOUNTER — Emergency Department (HOSPITAL_COMMUNITY)
Admission: EM | Admit: 2018-01-07 | Discharge: 2018-01-07 | Disposition: A | Discharge status: Home / Self Care | Payer: Medicare Other | Attending: Emergency Medicine | Admitting: Emergency Medicine

## 2018-01-07 ENCOUNTER — Ambulatory Visit: Payer: Medicare Other | Admitting: Family Medicine

## 2018-01-07 DIAGNOSIS — R350 Frequency of micturition: Secondary | ICD-10-CM | POA: Insufficient documentation

## 2018-01-07 DIAGNOSIS — R3915 Urgency of urination: Secondary | ICD-10-CM | POA: Diagnosis not present

## 2018-01-07 DIAGNOSIS — R102 Pelvic and perineal pain: Secondary | ICD-10-CM | POA: Diagnosis not present

## 2018-01-07 DIAGNOSIS — N390 Urinary tract infection, site not specified: Secondary | ICD-10-CM | POA: Diagnosis not present

## 2018-01-07 DIAGNOSIS — N319 Neuromuscular dysfunction of bladder, unspecified: Secondary | ICD-10-CM | POA: Diagnosis not present

## 2018-01-07 LAB — URINALYSIS, ROUTINE W REFLEX MICROSCOPIC
Bilirubin Urine: NEGATIVE
Glucose, UA: NEGATIVE mg/dL
Hgb urine dipstick: NEGATIVE
KETONES UR: NEGATIVE mg/dL
Nitrite: POSITIVE — AB
Protein, ur: NEGATIVE mg/dL
Specific Gravity, Urine: 1.015 (ref 1.005–1.030)
pH: 5 (ref 5.0–8.0)

## 2018-01-07 MED ORDER — PHENAZOPYRIDINE HCL 200 MG PO TABS: 200.0000 mg | ORAL_TABLET | Freq: Three times a day (TID) | ORAL | 0 refills | Status: DC

## 2018-01-07 MED ORDER — CEFDINIR 300 MG PO CAPS: 300.0000 mg | ORAL_CAPSULE | Freq: Two times a day (BID) | ORAL | 0 refills | Status: DC

## 2018-01-07 MED ORDER — CLOTRIMAZOLE 1 % VA CREA: 1.0000 | TOPICAL_CREAM | Freq: Every day | VAGINAL | 0 refills | Status: AC

## 2018-01-07 NOTE — ED Triage Notes (Signed)
Pt reports recurrent UTI, finihsed 2nd abx on Saturday.  Pt reports dysuria, states, "It's swollen all up and is numb down there."  Pt reports self-cathing for years.

## 2018-01-07 NOTE — ED Provider Notes (Signed)
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 intervention or inpatient treatment. I explained the diagnosis to the patient. Pain has been managed &  has no complaints prior to dc. Pt is comfortable with above plan and is stable for discharge at this time. All questions were answered prior to disposition. Strict return precautions for f/u to the ED were discussed. Encouraged follow up with PCP.  Patient was seen and evaluated by attending who was agreeable with the above plan including outpatient antibiotics and outpatient follow-up.  Final Clinical Impressions(s) / ED Diagnoses   Final diagnoses:  Pelvic pain  Urinary tract infection without hematuria, site unspecified    ED Discharge Orders        Ordered    cefdinir (OMNICEF) 300 MG capsule  2 times daily     01/07/18 1600    phenazopyridine (PYRIDIUM) 200 MG tablet  3 times daily     01/07/18 1600    clotrimazole (GYNE-LOTRIMIN) 1 % vaginal cream  Daily at bedtime     01/07/18 1600       Doristine Devoid, PA-C 01/07/18 1623    Doristine Devoid,  PA-C 01/07/18 1624    Isla Pence, MD 01/08/18 [phone]

## 2018-01-07 NOTE — ED Notes (Signed)
Pt verbalized understanding discharge instructions and denies any further needs or questions at this time. VS stable, ambulatory and steady gait.   

## 2018-01-07 NOTE — ED Notes (Signed)
Patient reports that she uses catheter at home about 3 times daily

## 2018-01-07 NOTE — Progress Notes (Deleted)
Subjective:    Patient ID: Nicole Perez , female   DOB: 10/30/1933 , 84 y.o..   MRN: [id number]  HPI  Nicole Perez is a 84 year old F past medical history of cough, iron deficiency anemia, hemorrhoids, here for No chief complaint on file.   1. ***  Review of Systems: Per HPI. All other systems reviewed and are negative.  There are no preventive care reminders to display for this patient.  Past Medical History: Patient Active Problem List   Diagnosis Date Noted  . Bursitis of pelvic region, right 09/13/2017  . Left buttock pain 08/23/2017  . TSH elevation 08/13/2017  . Left Leg Sciatica neuralgia 08/12/2017  . 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 History of RPR test 05/30/2017  . Encounter for long-term opiate analgesic use 05/30/2017  . Myelopathy of lumbar region ([place]) 05/30/2017  . Sinoatrial block   . Impaired functional mobility, balance, gait, and endurance 03/25/2017  . Hemorrhoid prolapse 11/16/2016  . Iron deficiency anemia due to chronic blood loss 09/14/2016  . Dieulafoy lesion of jejunum 05/04/2016  . External hemorrhoid   . AVM (arteriovenous malformation) of duodenum, acquired 04/09/2016  . Hiatal hernia 05/05/2015  . Dry eye syndrome 11/16/2014  . Glaucoma suspect, bilateral 11/16/2014  . Peroneal neuropathy 09/30/2014  . Cervical spondylosis without myelopathy 08/07/2014  . At risk for falls 08/06/2014  . Memory impairment 08/06/2014  . Peripheral painful Neuropathy ([place]) 08/06/2014  . UTI (urinary tract infection) 02/22/2014  . Bladder neurogenous 02/09/2014  . Osteoarthritis, multiple sites 08/11/2013  . Cystocele 08/11/2013  . Osteoarthritis of both knees 04/22/2013  . Obesity, unspecified 04/22/2013  . Vitamin D deficiency 09/30/2012  . Hearing loss sensory, bilateral 07/26/2011  . Spinal stenosis of thoracic region 07/26/2011  . Spinal stenosis of lumbar region 07/26/2011  . Pain in right buttock 07/26/2011  . Lichen sclerosus et  atrophicus of the vulva   . Pure hypercholesterolemia 05/10/2010  . Osteoarthritis of spine with myelopathy, thoracolumbar region 04/21/2010  . INSOMNIA, CHRONIC 04/18/2010  . Essential hypertension, benign 04/18/2010  . Chronic back pain 04/18/2010  . Osteoarthritis of both hips 09/11/2007    Medications: reviewed and updated Current Outpatient Medications  Medication Sig Dispense Refill  . benzocaine-resorcinol (VAGISIL) 5-2 % vaginal cream Place vaginally at bedtime. 60 g 0  . hydrochlorothiazide (HYDRODIURIL) 25 MG tablet Take 1 tablet (25 mg total) by mouth daily. 90 tablet 3  . HYDROcodone-acetaminophen (NORCO) 10-325 MG tablet Take 1 tablet by mouth every 6 (six) hours as needed. 120 tablet 0  . magnesium hydroxide (MILK OF MAGNESIA) 400 MG/5ML suspension Take 15 mLs by mouth daily as needed for mild constipation.    . Oxycodone HCl 10 MG TABS Take 1 tablet (10 mg total) by mouth every 12 (twelve) hours as needed. 60 tablet 0  . Vitamin D, Ergocalciferol, (DRISDOL) 50000 units CAPS capsule Take 1 capsule (50,000 Units total) by mouth every 7 (seven) days. (Patient not taking: Reported on 10/02/2017) 30 capsule 0   No current facility-administered medications for this visit.     Social Hx:  reports that she quit smoking about 34 years ago. She has a 5.00 pack-year smoking history. She has quit using smokeless tobacco.   Objective:   LMP 12/17/1962  Physical Exam  Gen: NAD, alert, cooperative with exam, well-appearing HEENT: NCAT, PERRL, clear conjunctiva, oropharynx clear, supple neck Cardiac: Regular rate and rhythm, normal S1/S2, no murmur, no edema, capillary refill brisk  Respiratory: Clear to auscultation  bilaterally, no wheezes, non-labored breathing Gastrointestinal: soft, non tender, non distended, bowel sounds present Skin: no rashes, normal turgor  Neurological: no gross deficits.  Psych: good insight, normal mood and affect  Assessment & Plan:  No  problem-specific Assessment & Plan notes found for this encounter.  No orders of the defined types were placed in this encounter.  No orders of the defined types were placed in this encounter.   Smitty Cords, MD [facility], PGY-3

## 2018-01-07 NOTE — Discharge Instructions (Signed)
vomiting, fevers or worsening pain.Please take the antibiotic as prescribed.  I also given you Pyridium to take for maximum of 2 days to help with bladder spasms.  Have also given you vaginal cream to help with any yeast infection.  I would encourage you to follow back up with your uro-gynecologist and your primary care doctor.  Return to the ED with any

## 2018-01-10 DIAGNOSIS — N3020 Other chronic cystitis without hematuria: Secondary | ICD-10-CM | POA: Diagnosis not present

## 2018-01-30 ENCOUNTER — Other Ambulatory Visit: Payer: Self-pay

## 2018-01-30 ENCOUNTER — Ambulatory Visit (INDEPENDENT_AMBULATORY_CARE_PROVIDER_SITE_OTHER): Payer: Medicare Other | Admitting: Family Medicine

## 2018-01-30 ENCOUNTER — Encounter: Payer: Self-pay | Admitting: Family Medicine

## 2018-01-30 VITALS — BP 136/74 | HR 54 | Temp 97.4°F | Ht 59.0 in | Wt 151.0 lb

## 2018-01-30 DIAGNOSIS — G959 Disease of spinal cord, unspecified: Secondary | ICD-10-CM

## 2018-01-30 DIAGNOSIS — M4715 Other spondylosis with myelopathy, thoracolumbar region: Secondary | ICD-10-CM

## 2018-01-30 DIAGNOSIS — M159 Polyosteoarthritis, unspecified: Secondary | ICD-10-CM

## 2018-01-30 DIAGNOSIS — Z79891 Long term (current) use of opiate analgesic: Secondary | ICD-10-CM

## 2018-01-30 DIAGNOSIS — G629 Polyneuropathy, unspecified: Secondary | ICD-10-CM

## 2018-01-30 DIAGNOSIS — M160 Bilateral primary osteoarthritis of hip: Secondary | ICD-10-CM | POA: Diagnosis not present

## 2018-01-30 DIAGNOSIS — M150 Primary generalized (osteo)arthritis: Secondary | ICD-10-CM | POA: Diagnosis not present

## 2018-01-30 DIAGNOSIS — M4804 Spinal stenosis, thoracic region: Secondary | ICD-10-CM

## 2018-01-30 DIAGNOSIS — G8929 Other chronic pain: Secondary | ICD-10-CM

## 2018-01-30 DIAGNOSIS — M48062 Spinal stenosis, lumbar region with neurogenic claudication: Secondary | ICD-10-CM | POA: Diagnosis not present

## 2018-01-30 DIAGNOSIS — M5441 Lumbago with sciatica, right side: Secondary | ICD-10-CM

## 2018-01-30 DIAGNOSIS — M170 Bilateral primary osteoarthritis of knee: Secondary | ICD-10-CM

## 2018-01-30 MED ORDER — HYDROCODONE-ACETAMINOPHEN 10-325 MG PO TABS: 1.0000 | ORAL_TABLET | Freq: Three times a day (TID) | ORAL | 0 refills | Status: DC | PRN

## 2018-01-30 MED ORDER — PREGABALIN 25 MG PO CAPS: 25.0000 mg | ORAL_CAPSULE | Freq: Three times a day (TID) | ORAL | 0 refills | Status: DC

## 2018-01-30 MED ORDER — HYDROCODONE-ACETAMINOPHEN 10-325 MG PO TABS: 1.0000 | ORAL_TABLET | Freq: Four times a day (QID) | ORAL | 0 refills | Status: DC | PRN

## 2018-01-30 NOTE — Patient Instructions (Signed)
Doctor McDiarmid will set up an Ultrasound of your kidney's to make sure that your bladder problem is not affecting them.  Start a month trial of Lyrica (Pregabalin) one capsule three times a day to help with you back, buttock and leg pain.  If it is helping without too bothersome side effect (leg swelling) then we can refill the medication and consider whether we should go up on the dosing. We will set up an appointment with a spine specialist who can see if we can help your pain with other options besides pain medications or surgery.

## 2018-01-31 ENCOUNTER — Encounter: Payer: Self-pay | Admitting: Family Medicine

## 2018-01-31 NOTE — Progress Notes (Signed)
   Subjective:    Patient ID: Nicole Perez, female    DOB: 03-Jun-1933, 84 y.o.   MRN: [id number] Nicole Perez is accompanied by daughter, Nicole Perez and granddgt, Nicole Perez Sources of clinical information for visit is/are patient, relative(s) and past medical records. Nursing assessment for this office visit was reviewed with the patient for accuracy and revision.  Previous Report(s) Reviewed: imaging reports, office notes Depression screen PHQ 2/9 01/30/2018  Decreased Interest 0  Down, Depressed, Hopeless 0  PHQ - 2 Score 0  Some recent data might be hidden   Fall Risk  01/30/2018 11/28/2017 10/02/2017 09/12/2017 08/22/2017  Falls in the past year? No No No No No  Number falls in past yr: - - - - -  Injury with Fall? - - - - -  Risk Factor Category  - - - - -  Risk for fall due to : - - - - -  Risk for fall due to: Comment - - - - -    HPI Myelopathy of lumbar region ([place]) - onset 2011 HPI Uses Norco 10 which does help except during acute exacerbations of pain with radiations into legs/below knees/ sole of foot on right  ADE with analgesic therapies: constipation controlled by castor oil Non-allopathic therapites: TENS unit   Pain Inventory General activity: Able to perform ADLs independently Relation with others: good Enjoyment of life: yes What TIME of day is your pain at its worst? night Sleep (in general): pain interferes with sleep  Pain is worse with: prolonged exertion Pain improves with: rest and pain medication Relief from Meds: 50 (percentage relief)  Mobility walk without assistance: no use a cane/walker: rolling walker ability to climb steps?: no Driving?:  no Difficulty driving with use of pain medications?: no  Function Employed (Y/N): no  date last employed: retired. Worked in [employer] until age in 40s   Neuro/Psych Prolonged sadness / depressed mood: no Anxiety: no  SH: Smoking reviewed  Review of Systems No fever No n/v      Objective:   Physical Exam VS reviewed GEN: Alert, Cooperative, Groomed, NAD, seated in chair, HOH with biaural hearing aids in palce COR: RRR, No M/G/R, No JVD, Normal PMI size and location LUNGS: BCTA, No Acc mm use, speaking in full sentences EXT: No peripheral leg edema. Feet without deformity or lesions. Palpable bilateral pedal pulses.  SKIN: No lesion nor rashes of face/trunk/extremities Neuro: right foot withplantar flexion weakness and decrease sensation monofil Gait: slow speed using rolling walker No significant path deviation, Step through absent  Psych: Normal affect/thought/speech/language    Assessment & Plan:  See problem list

## 2018-01-31 NOTE — Assessment & Plan Note (Addendum)
Established problem worsened.  Pain into right leg radiating below knee into right foot.  Pt with prior lumbar laminectomy 1987 Not a good candidate for spinal surgical intervention per Nicole Perez (consultation opinion during pt's 04/30/17 hospitalization for bilateral leg paresthesia) Nicole Perez ([facility]) has treated Nicole Perez back pain in past. Nicole Perez requesting to see Nicole Perez again for the radicular right leg pain exacerbation.  Referral order submitted.  Refill of Norco 5/325 #120 for now, 30 days, and 90 days sent online to pharmacy/ Start trial of Lyrica 25 oral TID and titrate to pain as renal function and ADE allow. RTC 6 weeks.

## 2018-02-14 ENCOUNTER — Other Ambulatory Visit: Payer: Self-pay | Admitting: Family Medicine

## 2018-02-14 DIAGNOSIS — Z139 Encounter for screening, unspecified: Secondary | ICD-10-CM

## 2018-02-20 DIAGNOSIS — M48062 Spinal stenosis, lumbar region with neurogenic claudication: Secondary | ICD-10-CM | POA: Diagnosis not present

## 2018-02-26 ENCOUNTER — Other Ambulatory Visit: Payer: Self-pay | Admitting: *Deleted

## 2018-02-26 DIAGNOSIS — G959 Disease of spinal cord, unspecified: Secondary | ICD-10-CM

## 2018-02-26 DIAGNOSIS — M4804 Spinal stenosis, thoracic region: Secondary | ICD-10-CM

## 2018-02-26 DIAGNOSIS — M150 Primary generalized (osteo)arthritis: Secondary | ICD-10-CM

## 2018-02-26 DIAGNOSIS — M4715 Other spondylosis with myelopathy, thoracolumbar region: Secondary | ICD-10-CM

## 2018-02-26 DIAGNOSIS — M48062 Spinal stenosis, lumbar region with neurogenic claudication: Secondary | ICD-10-CM

## 2018-02-26 DIAGNOSIS — G629 Polyneuropathy, unspecified: Secondary | ICD-10-CM

## 2018-02-26 DIAGNOSIS — M170 Bilateral primary osteoarthritis of knee: Secondary | ICD-10-CM

## 2018-02-26 DIAGNOSIS — M159 Polyosteoarthritis, unspecified: Secondary | ICD-10-CM

## 2018-02-26 DIAGNOSIS — Z79891 Long term (current) use of opiate analgesic: Secondary | ICD-10-CM

## 2018-02-26 DIAGNOSIS — M5441 Lumbago with sciatica, right side: Secondary | ICD-10-CM

## 2018-02-26 DIAGNOSIS — G8929 Other chronic pain: Secondary | ICD-10-CM

## 2018-02-26 DIAGNOSIS — M160 Bilateral primary osteoarthritis of hip: Secondary | ICD-10-CM

## 2018-02-27 ENCOUNTER — Telehealth: Payer: Self-pay

## 2018-02-27 DIAGNOSIS — M4804 Spinal stenosis, thoracic region: Secondary | ICD-10-CM

## 2018-02-27 DIAGNOSIS — M170 Bilateral primary osteoarthritis of knee: Secondary | ICD-10-CM

## 2018-02-27 DIAGNOSIS — M160 Bilateral primary osteoarthritis of hip: Secondary | ICD-10-CM

## 2018-02-27 DIAGNOSIS — G8929 Other chronic pain: Secondary | ICD-10-CM

## 2018-02-27 DIAGNOSIS — M48062 Spinal stenosis, lumbar region with neurogenic claudication: Secondary | ICD-10-CM

## 2018-02-27 DIAGNOSIS — M5441 Lumbago with sciatica, right side: Secondary | ICD-10-CM

## 2018-02-27 DIAGNOSIS — G959 Disease of spinal cord, unspecified: Secondary | ICD-10-CM

## 2018-02-27 DIAGNOSIS — Z79891 Long term (current) use of opiate analgesic: Secondary | ICD-10-CM

## 2018-02-27 DIAGNOSIS — M4715 Other spondylosis with myelopathy, thoracolumbar region: Secondary | ICD-10-CM

## 2018-02-27 DIAGNOSIS — M150 Primary generalized (osteo)arthritis: Secondary | ICD-10-CM

## 2018-02-27 DIAGNOSIS — G629 Polyneuropathy, unspecified: Secondary | ICD-10-CM

## 2018-02-27 DIAGNOSIS — M159 Polyosteoarthritis, unspecified: Secondary | ICD-10-CM

## 2018-02-27 NOTE — Telephone Encounter (Signed)
Pt called requesting refill of pain medication. Pt call back [phone] Wallace Cullens, RN

## 2018-02-27 NOTE — Telephone Encounter (Signed)
Refill not appropriate  Pt given 3 Rxs one monthly on 01/30/18

## 2018-02-28 ENCOUNTER — Telehealth: Payer: Self-pay | Admitting: Internal Medicine

## 2018-02-28 DIAGNOSIS — M170 Bilateral primary osteoarthritis of knee: Secondary | ICD-10-CM

## 2018-02-28 DIAGNOSIS — G959 Disease of spinal cord, unspecified: Secondary | ICD-10-CM

## 2018-02-28 DIAGNOSIS — M48062 Spinal stenosis, lumbar region with neurogenic claudication: Secondary | ICD-10-CM

## 2018-02-28 DIAGNOSIS — M4804 Spinal stenosis, thoracic region: Secondary | ICD-10-CM

## 2018-02-28 DIAGNOSIS — Z79891 Long term (current) use of opiate analgesic: Secondary | ICD-10-CM

## 2018-02-28 DIAGNOSIS — M159 Polyosteoarthritis, unspecified: Secondary | ICD-10-CM

## 2018-02-28 DIAGNOSIS — M150 Primary generalized (osteo)arthritis: Secondary | ICD-10-CM

## 2018-02-28 DIAGNOSIS — G629 Polyneuropathy, unspecified: Secondary | ICD-10-CM

## 2018-02-28 DIAGNOSIS — M4715 Other spondylosis with myelopathy, thoracolumbar region: Secondary | ICD-10-CM

## 2018-02-28 DIAGNOSIS — M5441 Lumbago with sciatica, right side: Secondary | ICD-10-CM

## 2018-02-28 DIAGNOSIS — G8929 Other chronic pain: Secondary | ICD-10-CM

## 2018-02-28 DIAGNOSIS — M160 Bilateral primary osteoarthritis of hip: Secondary | ICD-10-CM

## 2018-02-28 MED ORDER — HYDROCODONE-ACETAMINOPHEN 10-325 MG PO TABS: 1.0000 | ORAL_TABLET | Freq: Four times a day (QID) | ORAL | 0 refills | Status: DC | PRN

## 2018-02-28 NOTE — Telephone Encounter (Signed)
[facility] After Hours Telephone Line   Number received via page: [phone] Person calling: Kinzee Happel Reason for call: Patient is calling about her Norco prescription. She state a prescription for April was sent in but not one for March. I agreed to provide 6 pills over the weekend until patient is able to get to the clinic on Monday  Aastha Dayley PGY-3 [place]

## 2018-02-28 NOTE — Telephone Encounter (Signed)
Pharmacy is telling daughter they dont have Rx for 30 days.  Please call the pharmacy  [address] to straighten this out.  The Rxs show 30 and 60 day refill dates

## 2018-02-28 NOTE — Telephone Encounter (Signed)
Abby- pharmacist at [facility] called, states they received 2 rx's for patients hydrocodone, one is for 3- days from now, the other for 60 but did not receive a rx for right now. Can we resend a 30 day rx to start now to [facility]. Thanks. Wallace Cullens, RN

## 2018-03-03 MED ORDER — HYDROCODONE-ACETAMINOPHEN 10-325 MG PO TABS: 1.0000 | ORAL_TABLET | Freq: Four times a day (QID) | ORAL | 0 refills | Status: DC | PRN

## 2018-03-03 NOTE — Telephone Encounter (Signed)
Spoke with daughter and she is aware that script was sent to the pharmacy.  Jazmin Hartsell,CMA

## 2018-03-03 NOTE — Telephone Encounter (Signed)
Spoke with pharmacist.  He confirms received one Rx written on 2/14 for immediate fill and one written for 60 days after write date but none for 30 days after write date even though this is present in [place].  Will send in Rx fpr 120 to be filled today  Please notify patient  Thanks [place]

## 2018-03-03 NOTE — Telephone Encounter (Signed)
Patient and daughter have called about this. Pharmacy has confirmed that prescription for March has not been received. Danley Danker, RN [facility] RN)

## 2018-03-06 ENCOUNTER — Encounter: Payer: Self-pay | Admitting: *Deleted

## 2018-03-07 ENCOUNTER — Encounter: Payer: Self-pay | Admitting: Internal Medicine

## 2018-03-18 NOTE — Telephone Encounter (Signed)
Pt contacted and informed of next refill due for ~4/12. Pt was ok with this and was very appreciative. Pt scheduled for a FU apt with pcp in May.

## 2018-03-18 NOTE — Telephone Encounter (Signed)
Patient has a prescription for Norco dated 01/30/18 for #120 tablets that can be filled in 60 days which would be in about 03/28/18.   I confirmed this with her [facility].    Patient received #120 Norco from Dr Erin Hearing 03/03/18 that should last until she can refill it around 03/28/18.

## 2018-03-24 ENCOUNTER — Ambulatory Visit
Admission: RE | Admit: 2018-03-24 | Discharge: 2018-03-24 | Disposition: A | Discharge status: Home / Self Care | Payer: Medicare Other | Source: Ambulatory Visit | Attending: Family Medicine | Admitting: Family Medicine

## 2018-03-24 DIAGNOSIS — Z139 Encounter for screening, unspecified: Secondary | ICD-10-CM

## 2018-03-24 DIAGNOSIS — Z1231 Encounter for screening mammogram for malignant neoplasm of breast: Secondary | ICD-10-CM | POA: Diagnosis not present

## 2018-03-31 ENCOUNTER — Telehealth: Payer: Self-pay | Admitting: *Deleted

## 2018-03-31 NOTE — Telephone Encounter (Signed)
Left message on patient's phone.  Please let Nicole Perez know that she should be seen in the clinic because she has developed germs that are very resistant to antibiotics.    We will need to get urine for culture in the office and will need to make sure that this is a true urinary tract infection before prescribing antibiotics.

## 2018-03-31 NOTE — Telephone Encounter (Signed)
LM for patient to call back and schedule an appointment to be seen for burning with urination. Jazmin Hartsell,CMA

## 2018-03-31 NOTE — Telephone Encounter (Signed)
LM on nurse line. Wanting to know if Dr. McDiarmid will call her in some antibiotics because she is having "burning in her tract".  Will forward to MD. Ad Guttman, [name], [place]

## 2018-04-07 ENCOUNTER — Other Ambulatory Visit: Payer: Self-pay

## 2018-04-07 ENCOUNTER — Encounter: Payer: Self-pay | Admitting: Internal Medicine

## 2018-04-07 ENCOUNTER — Ambulatory Visit (INDEPENDENT_AMBULATORY_CARE_PROVIDER_SITE_OTHER): Payer: Medicare Other | Admitting: Internal Medicine

## 2018-04-07 VITALS — BP 134/72 | HR 69 | Temp 98.5°F | Ht 59.0 in | Wt 154.4 lb

## 2018-04-07 DIAGNOSIS — R7989 Other specified abnormal findings of blood chemistry: Secondary | ICD-10-CM

## 2018-04-07 DIAGNOSIS — R300 Dysuria: Secondary | ICD-10-CM | POA: Diagnosis not present

## 2018-04-07 DIAGNOSIS — R399 Unspecified symptoms and signs involving the genitourinary system: Secondary | ICD-10-CM | POA: Diagnosis not present

## 2018-04-07 LAB — POCT UA - MICROSCOPIC ONLY

## 2018-04-07 LAB — POCT URINALYSIS DIP (MANUAL ENTRY)
BILIRUBIN UA: NEGATIVE
BILIRUBIN UA: NEGATIVE mg/dL
Glucose, UA: NEGATIVE mg/dL
Nitrite, UA: POSITIVE — AB
PH UA: 6.5 (ref 5.0–8.0)
PROTEIN UA: NEGATIVE mg/dL
SPEC GRAV UA: 1.01 (ref 1.010–1.025)
Urobilinogen, UA: 0.2 E.U./dL

## 2018-04-07 NOTE — Patient Instructions (Signed)
Ms. Rojero,  I will call with your urine results. This test will help me know which antibiotic will treat your infection. Return if you have fevers or have nausea, as this could be a sign infection has spread to your kidneys or blood.  Best, Dr. Ola Spurr

## 2018-04-08 LAB — BASIC METABOLIC PANEL
BUN/Creatinine Ratio: 19 (ref 12–28)
BUN: 20 mg/dL (ref 8–27)
CALCIUM: 9.4 mg/dL (ref 8.7–10.3)
CO2: 26 mmol/L (ref 20–29)
CREATININE: 1.07 mg/dL — AB (ref 0.57–1.00)
Chloride: 103 mmol/L (ref 96–106)
GFR calc Af Amer: 56 mL/min/{1.73_m2} — ABNORMAL LOW (ref >59)
GFR calc non Af Amer: 48 mL/min/{1.73_m2} — ABNORMAL LOW (ref >59)
GLUCOSE: 92 mg/dL (ref 65–99)
Potassium: 4 mmol/L (ref 3.5–5.2)
Sodium: 142 mmol/L (ref 134–144)

## 2018-04-10 ENCOUNTER — Encounter: Payer: Self-pay | Admitting: Internal Medicine

## 2018-04-10 NOTE — Assessment & Plan Note (Addendum)
-   Suspect UTI given risk with self catheterization and history of the same. - Patient brought her own urine sample from home; ordered UA and urine culture on this. - Counseled that we would need to wait for urine culture results to know how to treat given history of antibiotic resistance - Recommended finding smaller gloves (should feel tight) to improve ability to feel landmarks while catheterizing and reduce risk of infection - Will order BMP, as patient had slight increase in SCr when last checked - Gave strict return precautions of fever, increased weakness, confusion or nausea/vomiting - Will call patient with antibiotic recommendation once urine culture results

## 2018-04-10 NOTE — Progress Notes (Signed)
[facility] Progress Note  Subjective:  Nicole Perez is a 84 y.o. female with history of spinal stenosis with sciatica, HTN, and pelvic organ prolapse and neurogenic bladder who self catheterizes. She presents for concern of UTI. She reports she has been having burning with urination and bladder spasms for about 2 weeks. She has been self-catheterizing since 2012 and has a history of UTIs with resistant sensitivities. She follows with [facility] and has been taking trimethoprim 100 mg daily since January as prophylaxis. She reports her urine has looked darker. In the past, there was thought not using lubrication with cathing was leading to infections, but patient now is using lubricant. Her daughter accompanies her today and says she wonders if patient's not using gloves could be contributing--patient says she cannot feel where she needs to place the catheter while wearing gloves. She does struggle with constipation and is on chronic opioids but will use a stool softener or milk of magnesia to have a BM if she has gone longer than 3 days. ROS: No fevers, no n/v/d  Allergies  Allergen Reactions  . Nitrofurantoin Other (See Comments)    Malaise and profound fatigue   . Keflex [Cephalexin] Nausea Only    Social History   Tobacco Use  . Smoking status: Former Smoker    Packs/day: 0.25    Years: 20.00    Pack years: 5.00    Last attempt to quit: 01/14/1984    Years since quitting: 34.2  . Smokeless tobacco: Former Network engineer Use Topics  . Alcohol use: No    Objective: Blood pressure 134/72, pulse 69, temperature 98.5 F (36.9 C), temperature source Oral, height 4\' 11"  (1.499 m), weight 154 lb 6.4 oz (70 kg), last menstrual period 12/17/1962, SpO2 98 %. Body mass index is 31.19 kg/m. Constitutional: Pleasant elderly female, sitting in chair with walker beside her Cardiovascular: RRR, S1, S2, no m/r/g.  Pulmonary/Chest: Effort normal and breath sounds normal.   Abdominal: Soft. +BS, NT Musculoskeletal: No CVA tenderness Neurological: AOx3, no focal deficits. Psychiatric: Normal mood and affect.  Vitals reviewed  Assessment/Plan: Dysuria - Suspect UTI given risk with self catheterization and history of the same. - Patient brought her own urine sample from home; ordered UA and urine culture on this. - Counseled that we would need to wait for urine culture results to know how to treat given history of antibiotic resistance - Recommended finding smaller gloves (should feel tight) to improve ability to feel landmarks while catheterizing and reduce risk of infection - Will order BMP, as patient had slight increase in SCr when last checked - Gave strict return precautions of fever, increased weakness, confusion or nausea/vomiting - Will call patient with antibiotic recommendation once urine culture results  Follow-up as scheduled in May with PCP.  Olene Floss, MD [place], PGY-3

## 2018-04-11 ENCOUNTER — Telehealth: Payer: Self-pay | Admitting: Internal Medicine

## 2018-04-11 LAB — URINE CULTURE

## 2018-04-11 MED ORDER — NITROFURANTOIN MONOHYD MACRO 100 MG PO CAPS: 100.0000 mg | ORAL_CAPSULE | Freq: Two times a day (BID) | ORAL | 0 refills | Status: DC

## 2018-04-11 NOTE — Telephone Encounter (Signed)
Spoke with patient about urine culture results. She still is having dysuria. She has intermediate sensitivities for nitrofurantoin and cefuroxime. Precepted with attending Dr. Erin Hearing. Asked her about listed nitrofurantoin allergy of extreme fatigue--she does not recall having this. Ordered nitrofurantoin 100 mg BID x 5 days with patient to stop the medication and call if any problems.  Olene Floss, MD [place], PGY-3

## 2018-04-24 ENCOUNTER — Encounter: Payer: Self-pay | Admitting: Family Medicine

## 2018-04-24 ENCOUNTER — Ambulatory Visit (INDEPENDENT_AMBULATORY_CARE_PROVIDER_SITE_OTHER): Payer: Medicare Other | Admitting: Family Medicine

## 2018-04-24 ENCOUNTER — Other Ambulatory Visit: Payer: Self-pay

## 2018-04-24 VITALS — BP 124/72 | HR 98 | Temp 97.7°F | Ht 59.0 in | Wt 152.0 lb

## 2018-04-24 DIAGNOSIS — M4804 Spinal stenosis, thoracic region: Secondary | ICD-10-CM

## 2018-04-24 DIAGNOSIS — G8929 Other chronic pain: Secondary | ICD-10-CM

## 2018-04-24 DIAGNOSIS — G629 Polyneuropathy, unspecified: Secondary | ICD-10-CM

## 2018-04-24 DIAGNOSIS — M150 Primary generalized (osteo)arthritis: Secondary | ICD-10-CM | POA: Diagnosis not present

## 2018-04-24 DIAGNOSIS — N390 Urinary tract infection, site not specified: Secondary | ICD-10-CM | POA: Diagnosis not present

## 2018-04-24 DIAGNOSIS — M5441 Lumbago with sciatica, right side: Secondary | ICD-10-CM | POA: Diagnosis not present

## 2018-04-24 DIAGNOSIS — Z79891 Long term (current) use of opiate analgesic: Secondary | ICD-10-CM

## 2018-04-24 DIAGNOSIS — M170 Bilateral primary osteoarthritis of knee: Secondary | ICD-10-CM

## 2018-04-24 DIAGNOSIS — M160 Bilateral primary osteoarthritis of hip: Secondary | ICD-10-CM | POA: Diagnosis not present

## 2018-04-24 DIAGNOSIS — M48062 Spinal stenosis, lumbar region with neurogenic claudication: Secondary | ICD-10-CM | POA: Diagnosis not present

## 2018-04-24 DIAGNOSIS — G959 Disease of spinal cord, unspecified: Secondary | ICD-10-CM

## 2018-04-24 DIAGNOSIS — M4715 Other spondylosis with myelopathy, thoracolumbar region: Secondary | ICD-10-CM

## 2018-04-24 DIAGNOSIS — M159 Polyosteoarthritis, unspecified: Secondary | ICD-10-CM

## 2018-04-24 MED ORDER — HYDROCODONE-ACETAMINOPHEN 10-325 MG PO TABS: 1.0000 | ORAL_TABLET | Freq: Four times a day (QID) | ORAL | 0 refills | Status: DC | PRN

## 2018-04-24 MED ORDER — HYDROCODONE-ACETAMINOPHEN 10-325 MG PO TABS
1.0000 | ORAL_TABLET | Freq: Four times a day (QID) | ORAL | 0 refills | Status: DC | PRN
Start: 2018-04-24 — End: 2018-05-24

## 2018-04-24 MED ORDER — ESTROGENS, CONJUGATED 0.625 MG/GM VA CREA: TOPICAL_CREAM | VAGINAL | 12 refills | Status: DC

## 2018-04-24 NOTE — Patient Instructions (Addendum)
We increased your Oxycodone back to every 6 hours as needed, 120 tablets every 6 hours as needed.   Continue taking your Lyrica as you are  We will list Nitrofurantoin as causing sleepiness for you.  Start Premarin dream twice week, pea size dollop of cream rubbed around you pee (urethra) whole)  This is to help prevent some urinary tract infections.  Put the cream on after your last catheterization of the day.

## 2018-04-25 NOTE — Assessment & Plan Note (Signed)
Established problem Uncontrolled Patient's number of monthly hydrocodone/apap were reduced to 90 tables in Dr Shakea Isip's absence.  Nocturanl pain was was worse with reduction to q 8 hour dosing and disp 90. Plan: Return to hydrocodone/apap 10/325 to Q 6 hr prn Disp #120/month/ Rxs sent online for now, in 30 days and in 60 days.  Review of [place] [facility] showed no aberrant behavior.  Low risk of diversion.

## 2018-04-25 NOTE — Assessment & Plan Note (Signed)
Established problem that has improved.  Continue Lyrica which she takes on a prn basis with good control of neuropathic pain.

## 2018-04-25 NOTE — Progress Notes (Signed)
Nicole Perez is accompanied by daughter and granddgt.  Sources of clinical information for visit is/are patient, relative(s) and past medical records. Nursing assessment for this office visit was reviewed with the patient for accuracy and revision.  Previous Report(s) Reviewed: historical medical records of visist for UTI on 04/07/18. Depression screen PHQ 2/9 04/24/2018  Decreased Interest 0  Down, Depressed, Hopeless 0  PHQ - 2 Score 0  Some recent data might be hidden   Fall Risk  04/24/2018 01/30/2018 11/28/2017 10/02/2017 09/12/2017  Falls in the past year? No No No No No  Number falls in past yr: - - - - -  Injury with Fall? - - - - -  Risk Factor Category  - - - - -  Risk for fall due to : - - - - -  Risk for fall due to: Comment - - - - -   UTI F/U and Recurrent UTIs - Patient experienced sleepiness and significant fatigue with start of Nitrofurantoin for UTI on 04/11/18.  Her Adverse drug reaction list had Nitrofurantoin listed as associated with "extreme fatigue."  Patient did not recall this reaction to nitrofurantoin, so decision was to prescribe it for the E. Coli UTI which showed intermideiate sensitivity to the abx.  - Patient completed 4 of the five days prescribed 5 days of nitrofurantoin.  Her fatigue resolved soon after the last dose of the abx. - No current dysuria, abdominal pain  Spinal Stenosis - Hydrocoddone/apap 10/325 four times a day controls apin from severe multilevel spinal stenosis.   - Ms Kotowski saw Dr Mina Marble ([facility], [place] and sports medicine).  He was uncertain if injections would be helpful.  Ms Sylvester Harder was very concerned that spinal injecitions may make her condition worse.  She declined the injection therapy, but left it open that if we were unable to control her pain with opiates and anti-neuronal pain meds Ms Marcucci would reconsider spinal injections.  - Ms Fouche controls her constipation with Milk of Magnesia about once a week. - Denies confusion,  falls  30 minutes face to face where spent in total with counseling / coordination of care took more than 50% of the total time. Counseling involved discussion of saving antibiotic therapy for UTI to times of severe symptoms o/w managing occassions of dysuria with increase fluid intake and OTC pyridium and antispasmotics.

## 2018-05-13 ENCOUNTER — Ambulatory Visit (INDEPENDENT_AMBULATORY_CARE_PROVIDER_SITE_OTHER): Payer: Medicare Other | Admitting: Internal Medicine

## 2018-05-13 VITALS — BP 146/80 | HR 69 | Temp 98.8°F | Ht 59.0 in

## 2018-05-13 DIAGNOSIS — N3000 Acute cystitis without hematuria: Secondary | ICD-10-CM | POA: Diagnosis not present

## 2018-05-13 MED ORDER — AMOXICILLIN-POT CLAVULANATE 875-125 MG PO TABS: 1.0000 | ORAL_TABLET | Freq: Two times a day (BID) | ORAL | 0 refills | Status: DC

## 2018-05-13 NOTE — Progress Notes (Signed)
   [facility] Nicole Mo, MD Phone: [phone]  Reason For Visit: SDA for UTI   UTI Patient states she was recently seen for UTI at the beginning of April.  She was given Macrobid which she states made her feel very sleepy.  She never finished this medication.  She is continued to have burning sensation when she urinates, she states the pain is sharp when it comes.  She self catheterizes.  She believes that she needs treatment for her previous UTI still. Medications tried: She is allergic to both Macrobid and Keflex.   Any antibiotics in the last 30 days: She has received antibiotics recently. More than 3 UTIs in the last 12 months: She has had multiple UTIs in the recent past.  Symptoms Urgency: yes   Frequency:None  Blood in urine: None  Pain in back:chronic back pain.  Fever: None   Review of Symptoms - see HPI PMH - Smoking status noted.    Objective: BP (!) 146/80   Pulse 69   Temp 98.8 F (37.1 C) (Oral)   Ht 4\' 11"  (1.499 m)   LMP 12/17/1962   SpO2 96%   BMI 30.70 kg/m  Gen: NAD, alert, cooperative with exam Cardio: regular rate and rhythm, S1S2 heard, no murmurs appreciated Pulm: clear to auscultation bilaterally, no wheezes, rhonchi or rales GI: soft, non-tender, non-distended, bowel sounds present, no hepatomegaly, no splenomegaly Skin: dry, intact, no rashes or lesions  Assessment/Plan: See problem based a/p  UTI (urinary tract infection) History of urinary tract infections for several yearas.  She states that she did not complete her nitrofurantoin prescribed towards the end of April due side effects and still has symptoms of dysuria.  No systemic symptoms or concerns for pyelonephritis.  Urinary history complicated by neurogenic bladder - patient self caths. Last cultures positive for E.coli and sensitive to Augmentin  - Will prescribe Augmentin  - Follow with urology in the future for further evaluation  - Discussed with Dr.  McDiramid her PCP

## 2018-05-13 NOTE — Patient Instructions (Addendum)
Please take Augmentin for 10 days to see if that helps with your symptoms. Please follow up with urology.

## 2018-05-14 ENCOUNTER — Encounter: Payer: Self-pay | Admitting: Internal Medicine

## 2018-05-14 NOTE — Assessment & Plan Note (Addendum)
History of urinary tract infections for several yearas.  She states that she did not complete her nitrofurantoin prescribed towards the end of April due side effects and still has symptoms of dysuria.  No systemic symptoms or concerns for pyelonephritis.  Urinary history complicated by neurogenic bladder - patient self caths. Last cultures positive for E.coli and sensitive to Augmentin  - Will prescribe Augmentin  - Follow with urology in the future for further evaluation  - Discussed with Dr. McDiramid her PCP

## 2018-05-19 DIAGNOSIS — M7061 Trochanteric bursitis, right hip: Secondary | ICD-10-CM | POA: Insufficient documentation

## 2018-05-19 DIAGNOSIS — M545 Low back pain: Secondary | ICD-10-CM | POA: Diagnosis not present

## 2018-05-19 DIAGNOSIS — M25551 Pain in right hip: Secondary | ICD-10-CM | POA: Diagnosis not present

## 2018-05-19 HISTORY — DX: Trochanteric bursitis, right hip: M70.61

## 2018-06-03 DIAGNOSIS — R339 Retention of urine, unspecified: Secondary | ICD-10-CM | POA: Diagnosis not present

## 2018-06-11 DIAGNOSIS — M25552 Pain in left hip: Secondary | ICD-10-CM | POA: Diagnosis not present

## 2018-07-09 ENCOUNTER — Encounter (HOSPITAL_COMMUNITY): Payer: Self-pay

## 2018-07-09 ENCOUNTER — Ambulatory Visit (HOSPITAL_COMMUNITY)
Admission: EM | Admit: 2018-07-09 | Discharge: 2018-07-09 | Disposition: A | Discharge status: Home / Self Care | Payer: Medicare Other | Attending: Family Medicine | Admitting: Family Medicine

## 2018-07-09 DIAGNOSIS — R42 Dizziness and giddiness: Secondary | ICD-10-CM | POA: Diagnosis not present

## 2018-07-09 DIAGNOSIS — D649 Anemia, unspecified: Secondary | ICD-10-CM

## 2018-07-09 DIAGNOSIS — M25552 Pain in left hip: Secondary | ICD-10-CM | POA: Diagnosis not present

## 2018-07-09 LAB — POCT I-STAT, CHEM 8
BUN: 19 mg/dL (ref 8–23)
CALCIUM ION: 1.21 mmol/L (ref 1.15–1.40)
CHLORIDE: 101 mmol/L (ref 98–111)
Creatinine, Ser: 1.3 mg/dL — ABNORMAL HIGH (ref 0.44–1.00)
Glucose, Bld: 94 mg/dL (ref 70–99)
HCT: 32 % — ABNORMAL LOW (ref 36.0–46.0)
HEMOGLOBIN: 10.9 g/dL — AB (ref 12.0–15.0)
POTASSIUM: 3.9 mmol/L (ref 3.5–5.1)
Sodium: 140 mmol/L (ref 135–145)
TCO2: 28 mmol/L (ref 22–32)

## 2018-07-09 NOTE — ED Triage Notes (Signed)
Pt presents with spells of dizziness

## 2018-07-23 NOTE — ED Provider Notes (Signed)
[facility]   [id number] 07/09/18 Arrival Time: [place]:  1. Lightheadedness   2. Anemia, unspecified type   Anemia appears to be chronic but is slowly worsening.  No current symptoms. Reassured that these symptoms do not appear to represent a serious or threatening condition.   Follow-up Information    Schedule an appointment as soon as possible for a visit  with McDiarmid, Blane Ohara, MD.   Specialty:  Family Medicine Why:  To discuss further workup for your lightheadedness. Contact information: [place] [address] [phone]          Reviewed expectations re: course of current medical issues. Questions answered. Outlined signs and symptoms indicating need for more acute intervention. Patient verbalized understanding. After Visit Summary given.   SUBJECTIVE:  Nicole Perez is a 84 y.o. female who presents for evaluation of dizziness described as lightheadedness. Symptoms began several days ago and have gradually improved. Frequency: daily lasting a few seconds. Symptoms are exacerbated by rising from supine position. Previous workup/treatments: none. Associated ear symptoms: none. Associated CNS symptoms: none. Patient denies otalgia tinnitus hearing loss. Recent infections: none. Head trauma: denied. Drug ingestion: none. Noise exposure: no occupational exposure. No FH of vertigo. No headaches. No palpitations or syncope.   ROS: As per HPI.   OBJECTIVE:  Vitals:   07/09/18 1924  BP: (!) 177/82  Pulse: 74  Resp: 20  Temp: 98.4 F (36.9 C)  SpO2: 98%    General appearance: alert; no distress Increased BP noted. Ears normal. Neck supple. No adenopathy or masses in the neck or supraclavicular regions. Cranial nerves are normal. PERLA. EOM's intact. DTR's normal and symmetric. Mental status normal. Gait and station normal. Romberg negative. Cerebellar function is normal. Pulse regular. CV: RRR. Lungs:  CTAB.  Investigations: Results for orders placed or performed during the hospital encounter of 07/09/18  I-STAT, chem 8  Result Value Ref Range   Sodium 140 135 - 145 mmol/L   Potassium 3.9 3.5 - 5.1 mmol/L   Chloride 101 98 - 111 mmol/L   BUN 19 8 - 23 mg/dL   Creatinine, Ser 1.30 (H) 0.44 - 1.00 mg/dL   Glucose, Bld 94 70 - 99 mg/dL   Calcium, Ion 1.21 1.15 - 1.40 mmol/L   TCO2 28 22 - 32 mmol/L   Hemoglobin 10.9 (L) 12.0 - 15.0 g/dL   HCT 32.0 (L) 36.0 - 46.0 %   Labs Reviewed  POCT I-STAT, CHEM 8 - Abnormal; Notable for the following components:      Result Value   Creatinine, Ser 1.30 (*)    Hemoglobin 10.9 (*)    HCT 32.0 (*)    All other components within normal limits   No results found.  Allergies  Allergen Reactions  . Nitrofurantoin Other (See Comments)    Malaise and profound fatigue   . Keflex [Cephalexin] Nausea Only    Past Medical History:  Diagnosis Date  . Abnormal angiography 04/22/16   third order arteries pancreatoduodenal occluded br embolization  . Acute blood loss anemia   . Acute renal failure ([place]) 02/23/2014  . AKI (acute kidney injury) ([place]) 04/17/2016  . Anemia due to blood loss, chronic 05/12/2016   Overview:  Added automatically from request for surgery [phone]   . Angiodysplasia of duodenum with hemorrhage   . ANTEROLATERAL ACETABULAR LABRAL TEAR BY MRI 09/11/2007   Qualifier: Diagnosis of  By: McDiarmid MD, Sherren Mocha    . Arterio-venous malformation 05/03/2016  .  At risk for falls 08/06/2014  . AVM (arteriovenous malformation) of duodenum, acquired 04/09/16   Dr Henrene Pastor (GI) argon plasm coagulation via EGD  . BACK PAIN, CHRONIC 04/18/2010   degenerative spine disease, spinal stenosis throughout spine  . Bladder neurogenous 02/09/2014   May 2016 begins being followed by Dr Matilde Sprang at [facility] 2015.  Urinary retention (+).  Requiring self-catheterization of bladder.  ENG.EMG [facility] (11/19/13): Absent H reflex responses raises  possibility of concomitant S1 radiculopathies    . Bleeding gastrointestinal 05/03/2016  . Blepharitis 11/16/2014   Diagnosis by optometrist, Renaldo Harrison on exam 11/13/2014  . Blood in stool 12/20/2014  . Cervical spondylosis without myelopathy 08/07/2014   Cervical Spine MRI 08/06/14: 1. There is multilevel cervical spondylosis which has progressed compared with a previous MRI performed more than 10 years ago.  Compared with a more recent neck CT from 3 years ago, no significant changes are observed.  2. Posterior osteophytes, uncinate spurring and facet hypertrophy  contribute to mild foraminal narrowing at multiple levels. There is no cord deformity. There is a degenerative grade 1 anterolisthesis at C6-7.  3. No evidence of acute osseous or ligamentous injury.     . Chest pain 04/09/2016  . Cholelithiasis   . Chronic back pain 04/18/2010   Chronic nonspecific low back pain without radiculopathy that bagan after struck by [facility] in 1995.  Spinal Stenosis, Lumbar, diffuse thruoughout lumbar spine, maximal at L2-3 by MRI 11/10 (followed by Dr Phylliss Bob at [place]) Spinal Stenosis, Thoracic, maximal at  T10 -T11 by MRI 11/10 Spondylolisthesis, L4-5 by MRI 11/10.  Foraminal stenosis, bilaterally at L4 and at L5 by MRI 11/10 Foraminal stenosis, right, T11 by MRI 11/10 S/P L3-4, L4-5 facet joint intra-articular injection, Dr Normajean Glasgow ([place])    . Colon polyps 2006. 2016   adenomatous and hyperplaxtic  . Complicated UTI (urinary tract infection) 01/30/2016  . Constipation 05/15/2016  . Cystocele 08/11/2013  . DEGENERATIVE JOINT DISEASE, HIPS 09/11/2007   Multilevel degenerative spine dz and spinal stenosis  . Demand ischemia ([place]) 05/03/2016   [facility] noted during GIB   . Dieulafoy lesion of jejunum 05/04/16   [facility] deep enteroscopy, lesion clipped.   . Dry eye syndrome 11/16/2014  . Duodenal ulcer 04/18/16   visible vessel on EGD   . Essential hypertension, benign 04/18/2010  . External hemorrhoid   . Gastric AVM 04/16/2016  . Gastrointestinal hemorrhage   . Gastrointestinal hemorrhage associated with angiodysplasia of stomach and duodenum   . Gastrointestinal hemorrhage with melena   . GI bleed 04/17/2016  . Glaucoma suspect 11/16/2014  . Gross hematuria 11/17/2015  . H. pylori infection 2016   h pylori erosive gastritis, treated with PPI, antibiotics.   Marland Kitchen Hearing loss sensory, bilateral 07/26/2011   Right >> Left.  Left ear hearing aid b/c work discrimination in       R. ear is very poor. Audiologist-Stephanie Nance at [facility] in [place].  (05/10/2010)   . Hemorrhoid prolapse 11/16/2016  . Hiatal hernia 05/05/2015   Large Hiatal Hernia found on EGD by Dr Hilarie Fredrickson (GI in [place]) in work up of melena and (+) FOBT.  Marland Kitchen History of colonic polyps 05/05/2015   Colonoscopy for melena and (+) FOBT by Dr Zenovia Jarred. In 03/2015. Eight sessile polyps ranging between 3-69mm in size were found in the ascending colon, transverse colon, and descending colon; polypectomies were performed with a cold snare 2. Multiple  sessile polyps were found in the rectosigmoid colon 3. Mild diverticulosis was noted in the transverse colon, descending colon, and sigmoid colon    . History of pneumonia 07/26/2011  . History of syphilis 1940s   Treated as child at [facility] per pt.  [facility] nor [facility] have records from [place]. Saddle nose.  Marland Kitchen HYPERLIPIDEMIA 05/10/2010   Qualifier: Diagnosis of  By: McDiarmid MD, Sherren Mocha    . Iliopsoas bursitis of left hip 11/16/2017  . Impaired functional mobility, balance, gait, and endurance 03/25/2017  . Incomplete bladder emptying 04/28/2014  . Incomplete emptying of bladder 02/09/2014  . Incontinence overflow, urine 04/28/2014  . INSOMNIA, CHRONIC 04/18/2010  . Iron deficiency anemia due to chronic blood loss 09/14/2016  . Junctional bradycardia   . Junctional rhythm 05/03/16   [facility] recommeded outpatient  echo and nuclear stress test  . Lichen sclerosus et atrophicus of the vulva   . Melena 03/2016   several AVMs in duodenum on EGD.  ablated.   . Memory impairment 08/06/2014  . Mobitz type 1 second degree AV block 04/30/2017  . Myelopathy of lumbar region ([place]) 05/30/2017  . Obesity, unspecified 04/22/2013  . Orthostatic hypotension 08/06/2014  . Osteoarthritis 04/21/2010   Spinal Stenosis, Lumbar, diffuse thruoughout lumbar spine, maximal at L2-3 by MRI 11/10 (followed by Dr Phylliss Bob at [place]) Spinal Stenosis, Thoracic, maximal at  T10 -T11 by MRI 11/10 Spondylolisthesis, L4-5 by MRI 11/10.  Foraminal stenosis, bilaterally at L4 and at L5 by MRI 11/10 Foraminal stenosis, right, T11 by MRI 11/10 S/P L3-4, L4-5 facet joint intra-articular injection, Dr Normajean Glasgow ([place])    . Osteoarthritis of both hips 09/11/2007   Annotation: associated right hip anterolateral  labral tear, DEGENERATIVE JOINT DISEASE, RIGHT HIP BY MRI Qualifier: Diagnosis of  By: McDiarmid MD, Sherren Mocha    . Osteoarthritis of both knees 04/22/2013   Discussed use of low dose APAP and up to two tablets of hydrocodone/APA 7.5/325 a day as needed for painful exacerbation of knee pain.  Patient had 40 mg Solumedrol with 4 ml 1% lidocaine without epi injected into right knee with anterolateral approach after sterile prep.  No complications.      . Osteoarthritis, multiple sites 08/11/2013  . Overflow incontinence 04/28/2014  . Pain in the chest   . Paresthesia of both feet 08/06/2014  . Parotid adenoma 1990, 2012   Right parotid, recurrent parotid pleimorphic adenoma.   . Pedal edema 05/09/2016  . Peptic ulcer disease with hemorrhage   . Peripheral artery disease ([place]) 03/07/2017   Left ABI 1.21  and Right ABI 0.94  . Peripheral painful Neuropathy ([place]) 08/06/2014   11/2013 ENG/EMG [facility]) Length-dependent axonal sensorimotor polyneuropathy bilaterally     . Peroneal neuropathy 09/30/2014   EMG/NCS 10/20/13 showed decreased peroneal nerve function and chronic lumbar radiculopathy affecting L4 &L5 on the right and possibly affecting S1 on the right.  - Dr Rexene Alberts though right foot decrease in sensation and right foot drop could be multifactorial icnluding traumatic injury to right foot and degenerative back disease.    . Pure hypercholesterolemia 05/10/2010   Qualifier: Diagnosis of  By: McDiarmid MD, Sherren Mocha    . Rectal fissure 12/16/2013  . Right leg weakness 08/06/2014   [facility] EMG/NCS 10/20/13 showed decreased peroneal nerve function and chronic lumbar radiculopathy affecting L4 &L5 on the right and possibly affecting S1 on the right.  EMG/NCS 10/20/13 showed decreased  peroneal nerve function and chronic lumbar radiculopathy affecting L4 &L5 on the right and possibly affecting S1 on the right.  - Dr Rexene Alberts though right foot decrease in sensation and right foot drop could be multifactorial icnluding traumatic injury to right foot and degenerative back disease.  Dr Rexene Alberts checked for peripheral neuropathy conditions from generalized diseases with blood work and repeat EMG/NCS and lumbar spine MRI - Lumbar MRI 11/05/13 showed Severe Degenerative lumbar disease with severe spinal stenosis at L1-2, L2-3, L3-4.  There is moderate stenosis at T12-L1 and multilevel foraminal stenosis.  There has been progression of degeenrative changes compared to 10/18/09 MRI - Cervical MRI 06/23/14 showed mulilevel cervical spondylosis that has progressed compared to MRI over 10 years pri  . Spinal stenosis of lumbar region 07/26/2011   10/20/13 Spine MRI ([facility], Dr Rexene Alberts) severe spinal stenosis L1-2, L2-3, L3-4 Spinal Stenosis, Lumbar, diffuse thruoughout lumbar spine, maximal at L2-3 by MRI 11/10 (followed by Dr Phylliss Bob at [place]). There is moderate stenosis at T12-L1 and multilevel foraminal stenosis. There has been  progression of degeenrative changes compared to 10/18/09 MRI  EMG/NCS 10/20/13 showed decreased peroneal nerve function and chronic lumbar radiculopathy affecting L4 &L5 on the right and possibly affecting S1 on the right.  - Dr Rexene Alberts though right foot decrease in sensation and right foot drop could be multifactorial icnluding traumatic injury to right foot and degenerative back disease.   - Cervical MRI 06/23/14 showed mulilevel cervical spondylosis that has progressed compared to MRI over 10 years prior.  Spinal Stenosis, Thoracic, maximal at  T10 -T11 by MRI 11/10 Spondylolisthesis, L4-5 by MRI 11/10.  Foraminal stenosis, bilaterally at L4 and at L5 by MRI 11/  . Spinal stenosis of thoracic region 07/26/2011   Spinal Stenosis, Lumbar, diffuse thruoughout lumbar spine, maximal at L2-3 by MRI 11/10 (followed by Dr Phylliss Bob at [place]) Spinal Stenosis, Thoracic, maximal at  T10 -T11 by MRI 11/10 Spondylolisthesis, L4-5 by MRI 11/10.  Foraminal stenosis, bilaterally at L4 and at L5 by MRI 11/10 Foraminal stenosis, right, T11 by MRI 11/10 S/P L3-4, L4-5 facet joint intra-articular injection, Dr Normajean Glasgow ([place])   . ST segment depression 04/17/2016  . Symptomatic anemia 04/09/2016  . Urethral polyp 11/17/2015  . UTI (urinary tract infection) 02/22/2014  . Vitamin D deficiency 09/30/2012   Social History   Socioeconomic History  . Marital status: Widowed    Spouse name: Not on file  . Number of children: Not on file  . Years of education: Not on file  . Highest education level: Not on file  Occupational History  . Occupation: retired    Fish farm manager: RETIRED  Social Needs  . Financial resource strain: Not on file  . Food insecurity:    Worry: Not on file    Inability: Not on file  . Transportation needs:    Medical: Not on file    Non-medical: Not on file  Tobacco Use  . Smoking status: Former Smoker    Packs/day: 0.25      Years: 20.00    Pack years: 5.00    Last attempt to quit: 01/14/1984    Years since quitting: 34.5  . Smokeless tobacco: Former Network engineer and Sexual Activity  . Alcohol use: No  . Drug use: No  . Sexual activity: Never    Birth control/protection: Surgical, None    Comment: hysterectomy  Lifestyle  .  Physical activity:    Days per week: Not on file    Minutes per session: Not on file  . Stress: Not on file  Relationships  . Social connections:    Talks on phone: Not on file    Gets together: Not on file    Attends religious service: Not on file    Active member of club or organization: Not on file    Attends meetings of clubs or organizations: Not on file    Relationship status: Not on file  . Intimate partner violence:    Fear of current or ex partner: Not on file    Emotionally abused: Not on file    Physically abused: Not on file    Forced sexual activity: Not on file  Other Topics Concern  . Not on file  Social History Narrative   Has 8 children   [name], [name], involved in her care   Thornell Mule ([name]), involved in care   Erlinda Hong ([name]), involved in care      Pt with 4 brothers, 5 sisters, 3 deceased siblings   Cares for her great-grandchildren during the day   Widowed in 2009   Lives alone.   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       Current Social History 10/02/2017        Who lives at home: Patient lives alone in one level home with ramp and handrails 10/02/2017    Transportation: Daughter drives her to appts 70/26/3785   Important Relationships 7 children, 30 grandchildren and 4 great grandchildren 10/02/2017    Pets: None 10/02/2017   Education / Work:  10 th grade/ Retired 10/02/2017   Interests / Fun: Shopping, especially thrift stores 10/02/2017    Current Stressors: Grandchildren making bad choices 10/02/2017   Religious / Personal Beliefs: Holiness 10/02/2017   Other: "I want to be happy and live a normal life." 10/02/2017   L. Ducatte, RN, BSN                                                                                                    Family History  Problem Relation Age of Onset  . Coronary artery disease Mother   . Stroke Father 7  . Hypertension Father   . Coronary artery disease Sister        open heart surgery  . Diabetes type II Sister   . Heart attack Sister   . Lupus Brother   . Heart disease Brother   . Hypertension Brother   . Heart attack Sister   . Heart disease Sister   . Breast cancer Daughter   . Breast cancer Daughter   . Hypertension Daughter   . Diabetes Daughter   . Kidney disease Daughter   . Drug abuse Daughter    Past Surgical History:  Procedure Laterality Date  . BLADDER SUSPENSION     Bladder tack x 2 (Dr Janice Norrie)  . BREAST LUMPECTOMY Bilateral    Lumpectomy of benign Breast lumps bilaterally, Dr Bubba Camp no scar seen   . CARPAL TUNNEL RELEASE  2010   Carpel Tunnel Release of  left wrist  03/2009 (Dr Fredna Dow): Nerve   . CARPAL TUNNEL RELEASE     right wrist  . CATARACT EXTRACTION W/ INTRAOCULAR LENS IMPLANT  2010   Dr Charise Killian (ophth)  . COLONIC EMBOLIZATION  04/22/16   [facility] service  third order arteries pancreatoduodenal occluded by coil embolization x 2  . COLONIC EMBOLIZATION  04/30/16   [facility] IR coil embolization of GDA & last poertion of pancreaticoduodenal branch of SMA  . COLONOSCOPY W/ POLYPECTOMY  2006  . CYSTOCELE REPAIR     Rectal prolapse and cyctocele adter hysterectomy requiring anterior repair Felipa Emory, MD)  . ENTEROSCOPY N/A 04/18/2016   Procedure: ENTEROSCOPY;  Surgeon: Mauri Pole, MD;  Location: [facility] ENDOSCOPY;  Service: Endoscopy;  Laterality: N/A;  . ESOPHAGOGASTRODUODENOSCOPY N/A 04/10/2016   Procedure: ESOPHAGOGASTRODUODENOSCOPY (EGD);  Surgeon: Irene Shipper, MD; argon plasm coagulation duod AVMs Location: [facility] ENDOSCOPY;  Service: Endoscopy;  Laterality: N/A;  . GIVENS CAPSULE STUDY N/A 04/28/2016   Procedure: GIVENS CAPSULE STUDY;  Surgeon: Carol Ada, MD;  Location: [place];  Service: Endoscopy;  Laterality: N/A;  . LAMINECTOMY     S/P L4-5 Laminectomy (1987) for decompression of spinal stenosis  . OTHER SURGICAL HISTORY  04/27/16   Capsule endoscopy showed bleed in deep small bowel  . PAROTID GLAND TUMOR EXCISION  1990   S/P excision of Right Parotid Gland Benign Tumor, 1990  . PAROTIDECTOMY  08/30/11   Radene Journey, MD (ENT) for recurrent right parotid pleomorphic adenoma by frozen section  . RECONSTRUCTION OF NOSE  1994   Nasal bridge reconstruction (Dr Judie Grieve, 1994) for following  Forklift accident on job. Surgery complicated by nerve damage resulting in difficulty raising right eyebrow   . RECTOCELE REPAIR     Rectal prolapse and cyctocele adter hysterectomy requiring anterior repair Felipa Emory, MD)  . SMALL BOWEL ENTEROSCOPY  05/04/16  . TOTAL ABDOMINAL HYSTERECTOMY W/ BILATERAL SALPINGOOPHORECTOMY  1964   Hysterectomy and bilateral oopherectomy at age 48 for benign reasons  . URETHRAL Seward Carol, MD 07/23/18 [phone]

## 2018-07-29 ENCOUNTER — Other Ambulatory Visit: Payer: Self-pay

## 2018-07-29 DIAGNOSIS — G959 Disease of spinal cord, unspecified: Secondary | ICD-10-CM

## 2018-07-29 DIAGNOSIS — M159 Polyosteoarthritis, unspecified: Secondary | ICD-10-CM

## 2018-07-29 DIAGNOSIS — G629 Polyneuropathy, unspecified: Secondary | ICD-10-CM

## 2018-07-29 DIAGNOSIS — M48062 Spinal stenosis, lumbar region with neurogenic claudication: Secondary | ICD-10-CM

## 2018-07-29 DIAGNOSIS — Z79891 Long term (current) use of opiate analgesic: Secondary | ICD-10-CM

## 2018-07-29 DIAGNOSIS — G8929 Other chronic pain: Secondary | ICD-10-CM

## 2018-07-29 DIAGNOSIS — M5441 Lumbago with sciatica, right side: Secondary | ICD-10-CM

## 2018-07-29 DIAGNOSIS — M150 Primary generalized (osteo)arthritis: Secondary | ICD-10-CM

## 2018-07-29 DIAGNOSIS — M4715 Other spondylosis with myelopathy, thoracolumbar region: Secondary | ICD-10-CM

## 2018-07-29 DIAGNOSIS — M170 Bilateral primary osteoarthritis of knee: Secondary | ICD-10-CM

## 2018-07-29 DIAGNOSIS — M4804 Spinal stenosis, thoracic region: Secondary | ICD-10-CM

## 2018-07-29 DIAGNOSIS — M160 Bilateral primary osteoarthritis of hip: Secondary | ICD-10-CM

## 2018-07-29 MED ORDER — HYDROCODONE-ACETAMINOPHEN 10-325 MG PO TABS: 1.0000 | ORAL_TABLET | Freq: Four times a day (QID) | ORAL | 0 refills | Status: DC | PRN

## 2018-08-07 ENCOUNTER — Ambulatory Visit (INDEPENDENT_AMBULATORY_CARE_PROVIDER_SITE_OTHER): Payer: Medicare Other | Admitting: Family Medicine

## 2018-08-07 ENCOUNTER — Other Ambulatory Visit: Payer: Self-pay

## 2018-08-07 ENCOUNTER — Encounter: Payer: Self-pay | Admitting: Family Medicine

## 2018-08-07 VITALS — BP 112/78 | HR 77 | Temp 98.3°F | Ht 59.0 in | Wt 152.0 lb

## 2018-08-07 DIAGNOSIS — M150 Primary generalized (osteo)arthritis: Secondary | ICD-10-CM

## 2018-08-07 DIAGNOSIS — G8929 Other chronic pain: Secondary | ICD-10-CM

## 2018-08-07 DIAGNOSIS — T3695XA Adverse effect of unspecified systemic antibiotic, initial encounter: Secondary | ICD-10-CM

## 2018-08-07 DIAGNOSIS — G959 Disease of spinal cord, unspecified: Secondary | ICD-10-CM | POA: Diagnosis not present

## 2018-08-07 DIAGNOSIS — R300 Dysuria: Secondary | ICD-10-CM | POA: Diagnosis not present

## 2018-08-07 DIAGNOSIS — M159 Polyosteoarthritis, unspecified: Secondary | ICD-10-CM

## 2018-08-07 DIAGNOSIS — G629 Polyneuropathy, unspecified: Secondary | ICD-10-CM | POA: Diagnosis not present

## 2018-08-07 DIAGNOSIS — M48062 Spinal stenosis, lumbar region with neurogenic claudication: Secondary | ICD-10-CM

## 2018-08-07 DIAGNOSIS — M160 Bilateral primary osteoarthritis of hip: Secondary | ICD-10-CM

## 2018-08-07 DIAGNOSIS — R42 Dizziness and giddiness: Secondary | ICD-10-CM

## 2018-08-07 DIAGNOSIS — M5441 Lumbago with sciatica, right side: Secondary | ICD-10-CM

## 2018-08-07 LAB — POCT URINALYSIS DIP (MANUAL ENTRY)
BILIRUBIN UA: NEGATIVE
BILIRUBIN UA: NEGATIVE mg/dL
Blood, UA: NEGATIVE
Glucose, UA: NEGATIVE mg/dL
Nitrite, UA: POSITIVE — AB
PH UA: 7 (ref 5.0–8.0)
Protein Ur, POC: NEGATIVE mg/dL
SPEC GRAV UA: 1.015 (ref 1.010–1.025)
Urobilinogen, UA: 0.2 E.U./dL

## 2018-08-07 LAB — POCT UA - MICROSCOPIC ONLY

## 2018-08-07 NOTE — Progress Notes (Signed)
   Subjective:    Patient ID: Nicole Perez, female    DOB: Feb 05, 1933, 84 y.o.   MRN: [id number] Nicole Perez is accompanied by patient and daughter Sources of clinical information for visit is/are patient, relative(s) and past medical records. Nursing assessment for this office visit was reviewed with the patient for accuracy and revision.  Previous Report(s) Reviewed: historical medical records  Depression screen [facility] 2/9 08/07/2018  Decreased Interest 0  Down, Depressed, Hopeless 0  PHQ - 2 Score 0  Some recent data might be hidden   Fall Risk  08/07/2018 04/24/2018 01/30/2018 11/28/2017 10/02/2017  Falls in the past year? No No No No No  Number falls in past yr: - - - - -  Injury with Fall? - - - - -  Risk Factor Category  - - - - -  Risk for fall due to : - - - - -  Risk for fall due to: Comment - - - - -    HPI  Dizziness - [name] seen in ED month ago for lightheadedness was thought to be orthostasis.  CBC was near her normal range Hgb - It has since resolved without medical intervention. - [name] relates that a month ago she awoke from sleep with the world spinning.  She was a little nauseous.   - No falls, no syncope - No PMH of same   Dysuria - Onset burning when peeing 3 days prior to ov. Pt self caths for neurogenic bladder - No suprapubic pain, no urgency, no fever/chills, no back pain - She took nitrofurantoin capsule she has leftover - Soon became sleepy and fatigued that has lasted until today with gradual imrovement.  - Pt had similar reaction to nitrofurantoin in past.  The abx is listed in her "Allergies" - The dysuria resolved  Chronic Pain Syndrome -HPI Uses Norco 10/325 as well as Lyrica  which does help  ADE with analgesic therapies: none Non-allopathic therapites: "Back injections" by [facility]   Pain Inventory Average Pain: moderate Pain Right Now: mild My pain is aching When aching at worst, unable to do iADLs, go out of home, enjoy life - Uses  rolling walker. Does not drive - Pt seen by Dr Normajean Glasgow ([facility]) since last ov, had "injections" in back that did not help with pain.  - Origins of pain:  Chronic nonspecific low back pain without radiculopathy that bagan after struck by [facility] in 1995.  Spinal Stenosis, Lumbar, diffuse thruoughout lumbar spine, maximal at L2-3 by MRI 11/10 (followed by Dr Phylliss Bob at [place]) Spinal Stenosis, Thoracic, maximal at  T10 -T11 by MRI 11/10 Spondylolisthesis, L4-5 by MRI 11/10.  Foraminal stenosis, bilaterally at L4 and at L5 by MRI 11/10 Foraminal stenosis, right, T11 by MRI 11/10    SH: No smoking        Review of Systems See HPI    Objective:   Physical Exam VS reviewed Gen: NAD, groomed  Conversant and social    Assessment & Plan:  Visit Problem List with A/P  Chronic back pain Adequate symptom control. Tolerating Norco 10/325 medication. Continue current medication regiment.   Dysuria Resolved with dose of nitrofurantoin. Pt urine cultured to see what resistance pattern No treatment  Dizziness and giddiness Resolved Suspect that event a month ago was BPPV rather than orthostasis  Discussed use of PT in future if reoccurs.

## 2018-08-07 NOTE — Patient Instructions (Addendum)
We are sending the urine for culture to see what antibiotics the germs are resistant to.  We won't treat with antibiotics unless you start getting bladder infection symptoms like burning with peeing, or abdominal pain over the bladder area.  There is only one antibiotic left that can treat bladder infections.  It is Augmentin.  We are trying to limit its use in you so the germs do not become resistant to it, leaving [place] with only IV antibiotics to treat bladder infections.   When you need refills of the Pregabapentin (Lyrica), let our office know about a week in advance.  We will change to a medicine that is a cousin to Pregabapentin, called gabapentin.  Nicole Perez is paid for by almost all insurances.   When you need a refill of your hydrocodone, notify your pharmacy tow to three days in advance.  They will send a request for refill to our office, then we can send an electronic prescription refill to the pharmacy.  Avoid checking the antibiotic Nitrofurantion (MacroBid).  It makes you tired and sleepy.

## 2018-08-08 ENCOUNTER — Encounter: Payer: Self-pay | Admitting: Family Medicine

## 2018-08-08 NOTE — Assessment & Plan Note (Signed)
Resolved Suspect that event a month ago was BPPV rather than orthostasis  Discussed use of PT in future if reoccurs.

## 2018-08-08 NOTE — Assessment & Plan Note (Signed)
Resolved with dose of nitrofurantoin. Pt urine cultured to see what resistance pattern No treatment

## 2018-08-08 NOTE — Assessment & Plan Note (Signed)
Adequate symptom control. Tolerating Norco 10/325 medication. Continue current medication regiment.

## 2018-08-10 LAB — URINE CULTURE

## 2018-08-21 ENCOUNTER — Other Ambulatory Visit: Payer: Self-pay

## 2018-08-21 DIAGNOSIS — N3000 Acute cystitis without hematuria: Secondary | ICD-10-CM

## 2018-08-21 NOTE — Telephone Encounter (Signed)
Pt's daughter called nurse line requesting refill of her antibiotic- that patient is having burning with urination and she is usually prescribed abx when this happens. Call back [phone] Wallace Cullens, RN

## 2018-08-22 MED ORDER — AMOXICILLIN 500 MG PO CAPS: 500.0000 mg | ORAL_CAPSULE | Freq: Three times a day (TID) | ORAL | 0 refills | Status: DC

## 2018-08-22 NOTE — Addendum Note (Signed)
Addended byWendy Poet, Carlen Rebuck D on: 08/22/2018 08:57 AM   Modules accepted: Orders

## 2018-08-22 NOTE — Telephone Encounter (Signed)
Patient performs In/OUT bladder caths. Hx highly resistant to oral antibiotics Last E.coli susceptible to ampicillin  Will Rx amoxicillin 500 mg tid for three days for possible UTI associated I/O bladder self-cath

## 2018-08-26 ENCOUNTER — Encounter: Payer: Self-pay | Admitting: Family Medicine

## 2018-08-26 DIAGNOSIS — M7061 Trochanteric bursitis, right hip: Secondary | ICD-10-CM

## 2018-08-26 NOTE — Progress Notes (Signed)
Summary from record of [facility] 05/19/18 - 7-24-1. 05/19/09 Fabian Sharp MD A/P Right greater trochanteric bursitis - tx'd with bursa injection solumedrol 40 mg  06/10/18 OV Fredonia Highland MD A/P Xrays show severe degenerative changes lumbar spine including degenerative listhesis.        Radicular symptoms with Lumbago on left.  Rx Toradol/DepoMedrol injection and Dosepak.   07/09/18 OV Fredonia Highland MD A/P Acute on Chronic Lumbago with radiating symptoms        Consultation with Dr Ron Agee for nonoperative options.         Rx Medrol Dose pack

## 2018-08-28 ENCOUNTER — Other Ambulatory Visit: Payer: Self-pay

## 2018-08-28 DIAGNOSIS — M4715 Other spondylosis with myelopathy, thoracolumbar region: Secondary | ICD-10-CM

## 2018-08-28 DIAGNOSIS — M48062 Spinal stenosis, lumbar region with neurogenic claudication: Secondary | ICD-10-CM

## 2018-08-28 DIAGNOSIS — G8929 Other chronic pain: Secondary | ICD-10-CM

## 2018-08-28 DIAGNOSIS — Z79891 Long term (current) use of opiate analgesic: Secondary | ICD-10-CM

## 2018-08-28 DIAGNOSIS — M159 Polyosteoarthritis, unspecified: Secondary | ICD-10-CM

## 2018-08-28 DIAGNOSIS — M5441 Lumbago with sciatica, right side: Secondary | ICD-10-CM

## 2018-08-28 DIAGNOSIS — G629 Polyneuropathy, unspecified: Secondary | ICD-10-CM

## 2018-08-28 DIAGNOSIS — M170 Bilateral primary osteoarthritis of knee: Secondary | ICD-10-CM

## 2018-08-28 DIAGNOSIS — G959 Disease of spinal cord, unspecified: Secondary | ICD-10-CM

## 2018-08-28 DIAGNOSIS — M4804 Spinal stenosis, thoracic region: Secondary | ICD-10-CM

## 2018-08-28 DIAGNOSIS — M160 Bilateral primary osteoarthritis of hip: Secondary | ICD-10-CM

## 2018-08-28 DIAGNOSIS — R339 Retention of urine, unspecified: Secondary | ICD-10-CM | POA: Diagnosis not present

## 2018-08-28 DIAGNOSIS — M150 Primary generalized (osteo)arthritis: Secondary | ICD-10-CM

## 2018-08-28 MED ORDER — HYDROCODONE-ACETAMINOPHEN 10-325 MG PO TABS: 1.0000 | ORAL_TABLET | Freq: Four times a day (QID) | ORAL | 0 refills | Status: DC | PRN

## 2018-09-01 DIAGNOSIS — R339 Retention of urine, unspecified: Secondary | ICD-10-CM | POA: Diagnosis not present

## 2018-09-22 ENCOUNTER — Telehealth: Payer: Self-pay

## 2018-09-22 ENCOUNTER — Other Ambulatory Visit: Payer: Self-pay | Admitting: Family Medicine

## 2018-09-22 DIAGNOSIS — N3000 Acute cystitis without hematuria: Secondary | ICD-10-CM

## 2018-09-22 NOTE — Telephone Encounter (Signed)
Pt LVM on nurse line stating, "someone from this number called me." I didn't see any notes in chart, will forward to PCP to see if he tried to reach out to patient.

## 2018-09-22 NOTE — Telephone Encounter (Signed)
I did not try to reach Nicole Perez.

## 2018-09-23 ENCOUNTER — Other Ambulatory Visit: Payer: Self-pay

## 2018-09-23 ENCOUNTER — Telehealth: Payer: Self-pay

## 2018-09-23 DIAGNOSIS — Z79891 Long term (current) use of opiate analgesic: Secondary | ICD-10-CM

## 2018-09-23 DIAGNOSIS — M159 Polyosteoarthritis, unspecified: Secondary | ICD-10-CM

## 2018-09-23 DIAGNOSIS — M4804 Spinal stenosis, thoracic region: Secondary | ICD-10-CM

## 2018-09-23 DIAGNOSIS — M170 Bilateral primary osteoarthritis of knee: Secondary | ICD-10-CM

## 2018-09-23 DIAGNOSIS — M5441 Lumbago with sciatica, right side: Secondary | ICD-10-CM

## 2018-09-23 DIAGNOSIS — G629 Polyneuropathy, unspecified: Secondary | ICD-10-CM

## 2018-09-23 DIAGNOSIS — M160 Bilateral primary osteoarthritis of hip: Secondary | ICD-10-CM

## 2018-09-23 DIAGNOSIS — M150 Primary generalized (osteo)arthritis: Secondary | ICD-10-CM

## 2018-09-23 DIAGNOSIS — M48062 Spinal stenosis, lumbar region with neurogenic claudication: Secondary | ICD-10-CM

## 2018-09-23 DIAGNOSIS — M4715 Other spondylosis with myelopathy, thoracolumbar region: Secondary | ICD-10-CM

## 2018-09-23 DIAGNOSIS — G8929 Other chronic pain: Secondary | ICD-10-CM

## 2018-09-23 DIAGNOSIS — G959 Disease of spinal cord, unspecified: Secondary | ICD-10-CM

## 2018-09-23 MED ORDER — HYDROCODONE-ACETAMINOPHEN 10-325 MG PO TABS: 1.0000 | ORAL_TABLET | Freq: Four times a day (QID) | ORAL | 0 refills | Status: DC | PRN

## 2018-09-23 NOTE — Telephone Encounter (Signed)
[name] LVM on nurse line requesting antibiotics for UTI sxs. Per pcp note, the medication was denied, patient needs an apt. Tried to call patient to set up apt, I had to LVM.

## 2018-09-23 NOTE — Telephone Encounter (Signed)
Asa Lente calls to check status. Evert Wenrich, [name], CMA

## 2018-09-23 NOTE — Telephone Encounter (Signed)
Asa Lente called nurse line requesting a refill on patients pain medication. Please advise.

## 2018-09-30 ENCOUNTER — Telehealth: Payer: Self-pay | Admitting: Family Medicine

## 2018-09-30 DIAGNOSIS — N3000 Acute cystitis without hematuria: Secondary | ICD-10-CM

## 2018-09-30 MED ORDER — AMOXICILLIN 500 MG PO CAPS
500.0000 mg | ORAL_CAPSULE | Freq: Three times a day (TID) | ORAL | 0 refills | Status: DC
Start: 2018-09-30 — End: 2018-12-15

## 2018-09-30 NOTE — Telephone Encounter (Signed)
Pt informed

## 2018-09-30 NOTE — Telephone Encounter (Signed)
Pt daughter Nicole Perez is calling to see if there is anyway Dr. McDiarmid can send in a refill for her UTI medications without being seen in the office. If this is not an option she would like to know if she could bring pt urine sample in tomorrow. Nicole Perez would like for someone to call her at [phone].

## 2018-09-30 NOTE — Telephone Encounter (Signed)
Please let dgt know that antibiotic for a possible UTI sent to patient's pharmacy. She should let us know if symptoms are not better within 4 days.

## 2018-10-22 ENCOUNTER — Telehealth: Payer: Self-pay | Admitting: *Deleted

## 2018-10-22 DIAGNOSIS — M160 Bilateral primary osteoarthritis of hip: Secondary | ICD-10-CM

## 2018-10-22 DIAGNOSIS — M48062 Spinal stenosis, lumbar region with neurogenic claudication: Secondary | ICD-10-CM

## 2018-10-22 DIAGNOSIS — G959 Disease of spinal cord, unspecified: Secondary | ICD-10-CM

## 2018-10-22 DIAGNOSIS — M159 Polyosteoarthritis, unspecified: Secondary | ICD-10-CM

## 2018-10-22 DIAGNOSIS — M4804 Spinal stenosis, thoracic region: Secondary | ICD-10-CM

## 2018-10-22 DIAGNOSIS — M5441 Lumbago with sciatica, right side: Secondary | ICD-10-CM

## 2018-10-22 DIAGNOSIS — Z79891 Long term (current) use of opiate analgesic: Secondary | ICD-10-CM

## 2018-10-22 DIAGNOSIS — M4715 Other spondylosis with myelopathy, thoracolumbar region: Secondary | ICD-10-CM

## 2018-10-22 DIAGNOSIS — G629 Polyneuropathy, unspecified: Secondary | ICD-10-CM

## 2018-10-22 DIAGNOSIS — M150 Primary generalized (osteo)arthritis: Secondary | ICD-10-CM

## 2018-10-22 DIAGNOSIS — M170 Bilateral primary osteoarthritis of knee: Secondary | ICD-10-CM

## 2018-10-22 DIAGNOSIS — G8929 Other chronic pain: Secondary | ICD-10-CM

## 2018-10-22 NOTE — Telephone Encounter (Signed)
pts daughter would like 3 months called in. Akram Kissick, [name], CMA

## 2018-10-23 MED ORDER — HYDROCODONE-ACETAMINOPHEN 10-325 MG PO TABS: 1.0000 | ORAL_TABLET | Freq: Three times a day (TID) | ORAL | 0 refills | Status: DC | PRN

## 2018-10-23 MED ORDER — HYDROCODONE-ACETAMINOPHEN 10-325 MG PO TABS: 1.0000 | ORAL_TABLET | Freq: Four times a day (QID) | ORAL | 0 refills | Status: DC | PRN

## 2018-10-23 NOTE — Addendum Note (Signed)
Addended byWendy Poet, Jamisha Hoeschen D on: 10/23/2018 01:50 PM   Modules accepted: Orders

## 2018-10-23 NOTE — Telephone Encounter (Signed)
Only one script filled that states do not fill until Dec 23, 2018.    Pt needs one for November and December. Will forward to MD. , [name], [place]

## 2018-10-23 NOTE — Telephone Encounter (Signed)
Rx for Norco 10/325 #60 fill now and Rx for Norco 10/325 #60 fill in 30 days  These two prescriptions were added to the [place] 10/325 #60 fill in 60 days prescribed and sent this morning.

## 2018-10-25 ENCOUNTER — Other Ambulatory Visit: Payer: Self-pay | Admitting: Family Medicine

## 2018-10-25 DIAGNOSIS — I10 Essential (primary) hypertension: Secondary | ICD-10-CM

## 2018-11-06 NOTE — Telephone Encounter (Signed)
Pt states that she normally gets #120 every month and was not sure why it was decreased.  Would like call from Dr. Wendy Poet. Fannye Myer, [name], CMA

## 2018-11-07 ENCOUNTER — Ambulatory Visit (INDEPENDENT_AMBULATORY_CARE_PROVIDER_SITE_OTHER): Payer: Medicare Other | Admitting: Family Medicine

## 2018-11-07 ENCOUNTER — Other Ambulatory Visit: Payer: Self-pay | Admitting: Family Medicine

## 2018-11-07 ENCOUNTER — Other Ambulatory Visit: Payer: Self-pay

## 2018-11-07 VITALS — BP 136/61 | HR 80 | Temp 98.0°F | Wt 154.0 lb

## 2018-11-07 DIAGNOSIS — M48062 Spinal stenosis, lumbar region with neurogenic claudication: Secondary | ICD-10-CM

## 2018-11-07 DIAGNOSIS — M47812 Spondylosis without myelopathy or radiculopathy, cervical region: Secondary | ICD-10-CM

## 2018-11-07 DIAGNOSIS — G8929 Other chronic pain: Secondary | ICD-10-CM

## 2018-11-07 DIAGNOSIS — N3001 Acute cystitis with hematuria: Secondary | ICD-10-CM

## 2018-11-07 DIAGNOSIS — M159 Polyosteoarthritis, unspecified: Secondary | ICD-10-CM

## 2018-11-07 DIAGNOSIS — M160 Bilateral primary osteoarthritis of hip: Secondary | ICD-10-CM

## 2018-11-07 DIAGNOSIS — M4804 Spinal stenosis, thoracic region: Secondary | ICD-10-CM

## 2018-11-07 DIAGNOSIS — G629 Polyneuropathy, unspecified: Secondary | ICD-10-CM

## 2018-11-07 DIAGNOSIS — N390 Urinary tract infection, site not specified: Secondary | ICD-10-CM

## 2018-11-07 DIAGNOSIS — G5731 Lesion of lateral popliteal nerve, right lower limb: Secondary | ICD-10-CM

## 2018-11-07 DIAGNOSIS — G959 Disease of spinal cord, unspecified: Secondary | ICD-10-CM

## 2018-11-07 DIAGNOSIS — M5441 Lumbago with sciatica, right side: Secondary | ICD-10-CM

## 2018-11-07 DIAGNOSIS — M150 Primary generalized (osteo)arthritis: Secondary | ICD-10-CM

## 2018-11-07 DIAGNOSIS — M170 Bilateral primary osteoarthritis of knee: Secondary | ICD-10-CM

## 2018-11-07 LAB — POCT UA - MICROSCOPIC ONLY: WBC, Ur, HPF, POC: >20

## 2018-11-07 LAB — POCT URINALYSIS DIP (MANUAL ENTRY)
BILIRUBIN UA: NEGATIVE
Glucose, UA: NEGATIVE mg/dL
NITRITE UA: POSITIVE — AB
PH UA: 5.5 (ref 5.0–8.0)
Protein Ur, POC: NEGATIVE mg/dL
Spec Grav, UA: 1.02 (ref 1.010–1.025)
Urobilinogen, UA: 0.2 E.U./dL

## 2018-11-07 MED ORDER — HYDROCODONE-ACETAMINOPHEN 10-325 MG PO TABS: 1.0000 | ORAL_TABLET | Freq: Four times a day (QID) | ORAL | 0 refills | Status: DC | PRN

## 2018-11-07 MED ORDER — CIPROFLOXACIN HCL 250 MG PO TABS: 250.0000 mg | ORAL_TABLET | Freq: Two times a day (BID) | ORAL | 0 refills | Status: DC

## 2018-11-07 NOTE — Progress Notes (Signed)
DDU:Nicole Perez, Nicole Ohara, MD Chief Complaint  Patient presents with  . Dysuria    Current Issues:  Presents with 14 days of dysuria, urinary urgency and urinary frequency Associated symptoms include:  dysuria, lower abdominal pain, urinary hesitancy and urinary retention  There is a previous history of of similar symptoms.  Patient has history of recurrent  UTIs.  Last one was 07/2018.  Patient treated with nitrofurantoin, however patient declining treatment with measures return or Keflex due to what she describes as "bad reactions" including nausea.  Sexually active:  No   No concern for STI.  Prior to Admission medications   Medication Sig Start Date End Date Taking? Authorizing Provider  amoxicillin (AMOXIL) 500 MG capsule Take 1 capsule (500 mg total) by mouth 3 (three) times daily. 09/30/18   McDiarmid, Nicole Ohara, MD  benzocaine-resorcinol (VAGISIL) 5-2 % vaginal cream Place 1 application vaginally at bedtime.  08/12/17   McDiarmid, Nicole Ohara, MD  conjugated estrogens (PREMARIN) vaginal cream Apply one pea size dollop to pee (urethra) opening twice a week. 04/24/18   McDiarmid, Nicole Ohara, MD  hydrochlorothiazide (HYDRODIURIL) 25 MG tablet TAKE 1 TABLET(25 MG) BY MOUTH DAILY 10/27/18   McDiarmid, Nicole Ohara, MD  HYDROcodone-acetaminophen (NORCO) 10-325 MG tablet Take 1 tablet by mouth every 6 (six) hours as needed for moderate pain. 11/07/18   McDiarmid, Nicole Ohara, MD  pregabalin (LYRICA) 25 MG capsule Take 1 capsule (25 mg total) by mouth 3 (three) times daily. 01/30/18   McDiarmid, Nicole Ohara, MD  trimethoprim (TRIMPEX) 100 MG tablet TK 1 T PO QD 01/10/18   Bjorn Loser, MD    Review of Systems: Review of Systems  All other systems reviewed and are negative.    PE:  BP 136/61   Pulse 80   Temp 98 F (36.7 C) (Oral)   Wt 154 lb (69.9 kg)   LMP 12/17/1962   SpO2 96%   BMI 31.10 kg/m  Physical Exam  Constitutional: She appears well-developed and well-nourished. No distress.  HENT:  Head:  Normocephalic and atraumatic.  Cardiovascular: Normal rate and regular rhythm.  Pulmonary/Chest: Effort normal and breath sounds normal.  Abdominal: Soft. She exhibits no distension. There is tenderness in the suprapubic area.  No CVA tenderness   Results for orders placed or performed in visit on 11/07/18  POCT urinalysis dipstick  Result Value Ref Range   Color, UA yellow yellow   Clarity, UA cloudy (A) clear   Glucose, UA negative negative mg/dL   Bilirubin, UA negative negative   Ketones, POC UA trace (5) (A) negative mg/dL   Spec Grav, UA 1.020 1.010 - 1.025   Blood, UA small (A) negative   pH, UA 5.5 5.0 - 8.0   Protein Ur, POC negative negative mg/dL   Urobilinogen, UA 0.2 0.2 or 1.0 E.U./dL   Nitrite, UA Positive (A) Negative   Leukocytes, UA Large (3+) (A) Negative    Assessment and Plan:   Recurrent acute, simple cystitis as evidenced on UA.   - Will treat with 5 days of ciprofloxacin given intolerance to other antibiotics - Urine culture -Follow-up PRN

## 2018-11-07 NOTE — Progress Notes (Unsigned)
A/ Patient had incorrectly had Norco 10/325 prescriptions for 30 and 60 days after 10/23/18 written for 60 tablets instead of usual #120 tablets.  P/  Canceled two prescriptions for Norco 10/325 #60 tablets to be dispensed 30 days and 60 days after 10/23/18.   Prescribed one prescription for Norco 10/325 #120 tablets to be dispensed now.  Patient will need to contact our office for refill of Norco 10/325 #120 tablets on or after 12/08/18.

## 2018-11-07 NOTE — Telephone Encounter (Signed)
Please let Ms Street know:    Dr Haim Hansson rescribed one prescription for [place] 10/325 #120 tablets to be dispensed now.  Ms Ellett will need to contact our office for her next refill of Norco 10/325 #120 tablets on or after 12/08/18.

## 2018-11-07 NOTE — Patient Instructions (Signed)

## 2018-11-07 NOTE — Telephone Encounter (Signed)
Patient has an appointment this afternoon, will inform her when she comes in.  [facility]

## 2018-11-10 ENCOUNTER — Encounter: Payer: Self-pay | Admitting: Family Medicine

## 2018-11-10 LAB — URINE CULTURE

## 2018-12-12 ENCOUNTER — Other Ambulatory Visit: Payer: Self-pay

## 2018-12-12 ENCOUNTER — Telehealth: Payer: Self-pay

## 2018-12-12 DIAGNOSIS — M5441 Lumbago with sciatica, right side: Secondary | ICD-10-CM

## 2018-12-12 DIAGNOSIS — M4804 Spinal stenosis, thoracic region: Secondary | ICD-10-CM

## 2018-12-12 DIAGNOSIS — M159 Polyosteoarthritis, unspecified: Secondary | ICD-10-CM

## 2018-12-12 DIAGNOSIS — G8929 Other chronic pain: Secondary | ICD-10-CM

## 2018-12-12 DIAGNOSIS — G959 Disease of spinal cord, unspecified: Secondary | ICD-10-CM

## 2018-12-12 DIAGNOSIS — M47812 Spondylosis without myelopathy or radiculopathy, cervical region: Secondary | ICD-10-CM

## 2018-12-12 DIAGNOSIS — M48062 Spinal stenosis, lumbar region with neurogenic claudication: Secondary | ICD-10-CM

## 2018-12-12 DIAGNOSIS — G5731 Lesion of lateral popliteal nerve, right lower limb: Secondary | ICD-10-CM

## 2018-12-12 DIAGNOSIS — M170 Bilateral primary osteoarthritis of knee: Secondary | ICD-10-CM

## 2018-12-12 DIAGNOSIS — M150 Primary generalized (osteo)arthritis: Secondary | ICD-10-CM

## 2018-12-12 DIAGNOSIS — N3000 Acute cystitis without hematuria: Secondary | ICD-10-CM

## 2018-12-12 DIAGNOSIS — M160 Bilateral primary osteoarthritis of hip: Secondary | ICD-10-CM

## 2018-12-12 DIAGNOSIS — G629 Polyneuropathy, unspecified: Secondary | ICD-10-CM

## 2018-12-12 MED ORDER — HYDROCODONE-ACETAMINOPHEN 10-325 MG PO TABS: 1.0000 | ORAL_TABLET | Freq: Four times a day (QID) | ORAL | 0 refills | Status: DC | PRN

## 2018-12-12 NOTE — Telephone Encounter (Signed)
Pt called nurse line stating she is having dysuria again and wants pcp to call in an antibiotic. I informed patient she may need to make an apt, however we have no openings today. Pt wanted to see if pcp could call something in since its the weekend. If not, we have some openings for Monday. Please advise.

## 2018-12-15 ENCOUNTER — Ambulatory Visit: Payer: Medicare Other

## 2018-12-15 MED ORDER — AMOXICILLIN-POT CLAVULANATE 875-125 MG PO TABS: 1.0000 | ORAL_TABLET | Freq: Two times a day (BID) | ORAL | 0 refills | Status: DC

## 2018-12-15 NOTE — Telephone Encounter (Signed)
Pt contacted and informed of antibiotic to pharmacy. Pt very grateful. Will cancel her apt for this afternoon. Pt instructed to come in if antibiotic does not relieve her symptoms.

## 2018-12-15 NOTE — Addendum Note (Signed)
Addended byWendy Poet, Mikias Lanz D on: 12/15/2018 09:19 AM   Modules accepted: Orders

## 2018-12-15 NOTE — Telephone Encounter (Signed)
Please inform patient that Augmentin Antibiotic sent into [facility] [address].   Dx: possible UTI, patient self catheterizes bladder   Tx: Augmentin 850 mg BID PO x 5d

## 2018-12-18 ENCOUNTER — Ambulatory Visit (HOSPITAL_COMMUNITY)
Admission: EM | Admit: 2018-12-18 | Discharge: 2018-12-18 | Disposition: A | Discharge status: Home / Self Care | Payer: Medicare Other

## 2018-12-18 ENCOUNTER — Telehealth: Payer: Self-pay | Admitting: *Deleted

## 2018-12-18 ENCOUNTER — Telehealth: Payer: Self-pay

## 2018-12-18 DIAGNOSIS — N3001 Acute cystitis with hematuria: Secondary | ICD-10-CM

## 2018-12-18 MED ORDER — CIPROFLOXACIN HCL 250 MG PO TABS: 250.0000 mg | ORAL_TABLET | Freq: Two times a day (BID) | ORAL | 0 refills | Status: DC

## 2018-12-18 NOTE — Telephone Encounter (Signed)
Patient calling to request an appt for "burning".  States she self-caths and feels she has an infection.  No appts available today or tomorrow.  Scheduled appt for 12/22/18 at 11:10 am with Dr. Ky Barban.  Informed patient to go to urgent care or ED if symptoms worsen and she is unable to wait until appt.  Patient verbalized understanding and will call back if appt no longer needed.  Burna Forts, MSN, RN-BC

## 2018-12-18 NOTE — ED Notes (Signed)
Pt was prescribed a different antibiotic by her primary care doctor for her recurrent UTI and did not know.  Pt was informed and left.

## 2018-12-18 NOTE — Telephone Encounter (Signed)
Left message with [name]'s voice mail that Rx cipro sent in to pharmacy.

## 2018-12-18 NOTE — Telephone Encounter (Signed)
Patient daughter [name] left message that Augmentin, prescribed on Monday, is making patient sick every time she takes it. Requests that antibiotic that was sent in last time be prescribed.  Call back is [phone]  Danley Danker, RN [facility])

## 2018-12-19 DIAGNOSIS — R339 Retention of urine, unspecified: Secondary | ICD-10-CM | POA: Diagnosis not present

## 2018-12-21 NOTE — Progress Notes (Deleted)
  Subjective:   Patient ID: Nicole Perez    DOB: 12-14-1933, 85 y.o. female   MRN: [id number]  Nicole Perez is a 85 y.o. female with a history of HTN, hemorrhoids, bilateral hearing loss, OA of spine, peripheral neuropathy, lichen sclerosis of vulva, cystocele, h/o recurrent UTI, HLD, chronic insomnia, obesity, vit D deficiency, bladder neurogenesis with self cath, memory impairment, glaucoma, IDA, congenital syphilis here for   Dysuria - with cystocele, bladder neurogenesis with self cath. H/o recurrent UTIs, most recent 11/07/18, Ecoli, with sx reported again 12/27. Most recently treated with cipro given previous bad reactions (nausea***) to keflex, augmentin and nitrofurantoin. - cipro 1/2***  DYSURIA  Pain urinating started *** days ago. Pain is: *** Medications tried: *** Any antibiotics in the last 30 days: *** More than 3 UTIs in the last 12 months: *** STD exposure: *** Possibly pregnant: ***  Symptoms Urgency: *** Frequency: *** Blood in urine: *** Pain in back:*** Fever: *** Vaginal discharge: *** Mouth Ulcers: ***  Review of Systems:  Per HPI.  [place], medications and smoking status reviewed.  Objective:   LMP 12/17/1962  Vitals and nursing note reviewed.  General: well nourished, well developed, in no acute distress with non-toxic appearance HEENT: normocephalic, atraumatic, moist mucous membranes Neck: supple, non-tender without lymphadenopathy CV: regular rate and rhythm without murmurs, rubs, or gallops, no lower extremity edema Lungs: clear to auscultation bilaterally with normal work of breathing Abdomen: soft, non-tender, non-distended, no masses or organomegaly palpable, normoactive bowel sounds Skin: warm, dry, no rashes or lesions Extremities: warm and well perfused, normal tone MSK: ROM grossly intact, strength intact, gait normal Neuro: Alert and oriented, speech normal  Assessment & Plan:   No problem-specific Assessment & Plan notes found for  this encounter.  No orders of the defined types were placed in this encounter.  No orders of the defined types were placed in this encounter.   Rory Percy, DO PGY-2, [facility] 12/21/2018 9:16 PM

## 2018-12-22 ENCOUNTER — Ambulatory Visit: Payer: Medicare Other | Admitting: Family Medicine

## 2019-01-01 ENCOUNTER — Telehealth: Payer: Self-pay

## 2019-01-01 DIAGNOSIS — N3001 Acute cystitis with hematuria: Secondary | ICD-10-CM

## 2019-01-01 NOTE — Telephone Encounter (Signed)
Patient left message she has finished all her Cipro but is having burning again. Wants to know if she can get refill.  Call back is [phone]  Danley Danker, RN [facility])

## 2019-01-02 MED ORDER — CIPROFLOXACIN HCL 250 MG PO TABS: 250.0000 mg | ORAL_TABLET | Freq: Two times a day (BID) | ORAL | 0 refills | Status: AC

## 2019-01-02 NOTE — Telephone Encounter (Signed)
Patient made aware by Cherrie Distance.  Nicole Perez,CMA

## 2019-01-02 NOTE — Telephone Encounter (Signed)
I sent in refill of Cipro antibiotic for presumed acute cystitis in patient who performs self-caths of bladder.    Please let Ms Vivero know that if the burning returns again, Ms Ator will need to be seen by a provider before further treatment for burning symptoms.

## 2019-01-05 NOTE — Telephone Encounter (Signed)
Patient called and was not aware. Made aware. Danley Danker, RN [facility] RN)

## 2019-01-16 ENCOUNTER — Other Ambulatory Visit: Payer: Self-pay

## 2019-01-16 DIAGNOSIS — M159 Polyosteoarthritis, unspecified: Secondary | ICD-10-CM

## 2019-01-16 DIAGNOSIS — G959 Disease of spinal cord, unspecified: Secondary | ICD-10-CM

## 2019-01-16 DIAGNOSIS — G5731 Lesion of lateral popliteal nerve, right lower limb: Secondary | ICD-10-CM

## 2019-01-16 DIAGNOSIS — M47812 Spondylosis without myelopathy or radiculopathy, cervical region: Secondary | ICD-10-CM

## 2019-01-16 DIAGNOSIS — M160 Bilateral primary osteoarthritis of hip: Secondary | ICD-10-CM

## 2019-01-16 DIAGNOSIS — M48062 Spinal stenosis, lumbar region with neurogenic claudication: Secondary | ICD-10-CM

## 2019-01-16 DIAGNOSIS — G629 Polyneuropathy, unspecified: Secondary | ICD-10-CM

## 2019-01-16 DIAGNOSIS — M4804 Spinal stenosis, thoracic region: Secondary | ICD-10-CM

## 2019-01-16 DIAGNOSIS — G8929 Other chronic pain: Secondary | ICD-10-CM

## 2019-01-16 DIAGNOSIS — M5441 Lumbago with sciatica, right side: Secondary | ICD-10-CM

## 2019-01-16 DIAGNOSIS — M170 Bilateral primary osteoarthritis of knee: Secondary | ICD-10-CM

## 2019-01-16 DIAGNOSIS — M150 Primary generalized (osteo)arthritis: Secondary | ICD-10-CM

## 2019-01-16 NOTE — Telephone Encounter (Signed)
Hi patients daughter came in she states Mom has called pharmacy [facility] [address] in [place] trying to get refill on Oxycodone only has 2 pills left  Please call patient P#[phone] or [phone].

## 2019-01-19 MED ORDER — HYDROCODONE-ACETAMINOPHEN 10-325 MG PO TABS: 1.0000 | ORAL_TABLET | Freq: Four times a day (QID) | ORAL | 0 refills | Status: DC | PRN

## 2019-01-19 NOTE — Telephone Encounter (Signed)
Please let this patient know their prescription(s) are available for pick up from their pharmacy.

## 2019-01-19 NOTE — Addendum Note (Signed)
Addended by: Lissa Morales D on: 01/19/2019 07:56 AM   Modules accepted: Orders

## 2019-02-02 ENCOUNTER — Other Ambulatory Visit: Payer: Self-pay | Admitting: Family Medicine

## 2019-02-02 DIAGNOSIS — I10 Essential (primary) hypertension: Secondary | ICD-10-CM

## 2019-02-03 ENCOUNTER — Other Ambulatory Visit: Payer: Self-pay

## 2019-02-03 ENCOUNTER — Ambulatory Visit (INDEPENDENT_AMBULATORY_CARE_PROVIDER_SITE_OTHER): Payer: Medicare Other | Admitting: Family Medicine

## 2019-02-03 VITALS — BP 110/80 | HR 71 | Temp 97.8°F | Wt 158.0 lb

## 2019-02-03 DIAGNOSIS — N3001 Acute cystitis with hematuria: Secondary | ICD-10-CM | POA: Diagnosis not present

## 2019-02-03 DIAGNOSIS — Z87448 Personal history of other diseases of urinary system: Secondary | ICD-10-CM | POA: Diagnosis not present

## 2019-02-03 LAB — POCT URINALYSIS DIP (MANUAL ENTRY)
BILIRUBIN UA: NEGATIVE mg/dL
Bilirubin, UA: NEGATIVE
Glucose, UA: NEGATIVE mg/dL
Nitrite, UA: POSITIVE — AB
Protein Ur, POC: NEGATIVE mg/dL
Spec Grav, UA: 1.02 (ref 1.010–1.025)
Urobilinogen, UA: 0.2 E.U./dL
pH, UA: 5.5 (ref 5.0–8.0)

## 2019-02-03 LAB — POCT UA - MICROSCOPIC ONLY

## 2019-02-03 MED ORDER — CIPROFLOXACIN HCL 500 MG PO TABS: 500.0000 mg | ORAL_TABLET | Freq: Two times a day (BID) | ORAL | 0 refills | Status: AC

## 2019-02-03 NOTE — Patient Instructions (Signed)
It was nice meeting you today Ms. Belflower!  For your UTI, I am prescribing ciprofloxacin, which you will need to take twice per day for the next 7 days.  I will let you know what your urine culture results show.  Please return to the clinic in about 1 or 2 weeks to have your urine retested to make sure that there is no more blood in it, since the blood that you saw today could be due to this infection.  If your urine does show blood, you may need a referral to urology.  I am also placing a referral to gynecology to see if you could benefit from a different sized pessary.  If you have any questions or concerns, please feel free to call the clinic.   Be well,  Dr. Shan Levans

## 2019-02-03 NOTE — Progress Notes (Signed)
Subjective:    Nicole Perez - 85 y.o. female MRN [id number]  Date of birth: [birth year]  CC:  Nicole Perez is here for urinary symptoms.  HPI: - has had dysuria for the last three weeks, but this morning she noticed gross blood when she catheterized herself, so she decided to come in.  This is been the only episode of hematuria that she has noticed for a long time - urine has been malodorous for the last week - has had suprapubic pain and pressure, as if her bladder is very full.  She has had a pessary put in before, but it did not fit well and was taken out.  He also says that she had her bladder "tacked up" when she was in her 26s - feels very cold for the last week and wonders if her blood count is low.  Denies subjective fevers. -She says that she tolerates the antibiotic ciprofloxacin better than other antibiotics for her UTIs  Health Maintenance:  Health Maintenance Due  Topic Date Due  . INFLUENZA VACCINE  07/17/2018    -  reports that she quit smoking about 35 years ago. She has a 5.00 pack-year smoking history. She has quit using smokeless tobacco. - Review of Systems: Per HPI. - Past Medical History: Patient Active Problem List   Diagnosis Date Noted  . History of cystocele 02/05/2019  . Antibiotic reaction, initial encounter 08/07/2018  . Greater trochanteric bursitis of right hip 05/19/2018  . Recurrent UTI 04/24/2018  . Iliopsoas bursitis of left hip 11/16/2017  . Bursitis of pelvic region, right 09/13/2017  . Left buttock pain 08/23/2017  . TSH elevation 08/13/2017  . Left Leg Sciatica neuralgia 08/12/2017  . 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 History of RPR test 05/30/2017  . Encounter for long-term opiate analgesic use 05/30/2017  . 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[place]) 05/30/2017  . Sinoatrial block   . Impaired functional mobility, balance, gait, and endurance 03/25/2017  . Hemorrhoid prolapse 11/16/2016  . Iron deficiency anemia due  to chronic blood loss 09/14/2016  . Dieulafoy lesion of jejunum 05/04/2016  . External hemorrhoid   . AVM (arteriovenous malformation) of duodenum, acquired 04/09/2016  . Hiatal hernia 05/05/2015  . Dry eye syndrome 11/16/2014  . Glaucoma suspect, bilateral 11/16/2014  . Peroneal neuropathy 09/30/2014  . Cervical spondylosis without myelopathy 08/07/2014  . At risk for falls 08/06/2014  . Memory impairment 08/06/2014  . Peripheral painful Neuropathy ([place]) 08/06/2014  . UTI (urinary tract infection) 02/22/2014  . Bladder neurogenous 02/09/2014  . Osteoarthritis, multiple sites 08/11/2013  . Cystocele 08/11/2013  . Dysuria 07/21/2013  . Dizziness and giddiness 05/12/2013  . Osteoarthritis of both knees 04/22/2013  . Obesity, unspecified 04/22/2013  . Vitamin D deficiency 09/30/2012  . Hearing loss sensory, bilateral 07/26/2011  . Spinal stenosis of thoracic region 07/26/2011  . Spinal stenosis of lumbar region 07/26/2011  . Pain in right buttock 07/26/2011  . Lichen sclerosus et atrophicus of the vulva   . Pure hypercholesterolemia 05/10/2010  . Osteoarthritis of spine with myelopathy, thoracolumbar region 04/21/2010  . INSOMNIA, CHRONIC 04/18/2010  . Essential hypertension, benign 04/18/2010  . Chronic back pain 04/18/2010  . Osteoarthritis of both hips 09/11/2007   - Medications: reviewed and updated   Objective:   Physical Exam BP 110/80   Pulse 71   Temp 97.8 F (36.6 C) (Oral)   Wt 158 lb (71.7 kg)   LMP 12/17/1962   SpO2 97%  BMI 31.91 kg/m  Gen: NAD, alert, cooperative with exam, appears stated age CV: RRR, good S1/S2, no murmur, no edema Resp: CTABL, no wheezes, non-labored Abd: SNTND, BS present, no guarding or organomegaly, no CVA tenderness, no suprapubic tenderness Psych: good insight, alert and oriented     Assessment & Plan:   UTI (urinary tract infection) UA and microscopy along with patient symptoms are consistent with UTI.  Although  ciprofloxacin can be contraindicated in elderly patients, will prescribe this medication because patient has allergies to Keflex and nitrofurantoin and prefers ciprofloxacin.  Patient's hematuria may have been due to irritation from the catheter or due to her current UTI, although microscopy only showed 0-2 RBCs.  Urine culture was performed to ensure that the appropriate antibiotic was given in light of patient's frequent UTIs.  Would like to retest patient's urine in about 2 weeks after her infection has been treated to make sure that her hematuria goes away.  If it does not, she may need a referral to urology.  Cystocele Patient appears to live with symptoms of a cystocele.  Will refer her to gynecology for reassessment of a pessary, since this may improve patient's quality of life.    Maia Breslow, M.D. 02/05/2019, 8:36 AM PGY-2, [place]

## 2019-02-04 LAB — CBC WITH DIFFERENTIAL/PLATELET
Basophils Absolute: 0.1 10*3/uL (ref 0.0–0.2)
Basos: 1 %
EOS (ABSOLUTE): 0.2 10*3/uL (ref 0.0–0.4)
Eos: 4 %
Hematocrit: 33.2 % — ABNORMAL LOW (ref 34.0–46.6)
Hemoglobin: 11 g/dL — ABNORMAL LOW (ref 11.1–15.9)
Immature Grans (Abs): 0 10*3/uL (ref 0.0–0.1)
Immature Granulocytes: 0 %
LYMPHS ABS: 1.8 10*3/uL (ref 0.7–3.1)
LYMPHS: 33 %
MCH: 28.9 pg (ref 26.6–33.0)
MCHC: 33.1 g/dL (ref 31.5–35.7)
MCV: 87 fL (ref 79–97)
MONOCYTES: 7 %
Monocytes Absolute: 0.4 10*3/uL (ref 0.1–0.9)
Neutrophils Absolute: 3 10*3/uL (ref 1.4–7.0)
Neutrophils: 55 %
Platelets: 258 10*3/uL (ref 150–450)
RBC: 3.8 x10E6/uL (ref 3.77–5.28)
RDW: 13.2 % (ref 11.7–15.4)
WBC: 5.4 10*3/uL (ref 3.4–10.8)

## 2019-02-05 DIAGNOSIS — Z87448 Personal history of other diseases of urinary system: Secondary | ICD-10-CM

## 2019-02-05 HISTORY — DX: Personal history of other diseases of urinary system: Z87.448

## 2019-02-05 LAB — URINE CULTURE

## 2019-02-05 NOTE — Assessment & Plan Note (Addendum)
UA and microscopy along with patient symptoms are consistent with UTI.  Although ciprofloxacin can be contraindicated in elderly patients, will prescribe this medication because patient has allergies to Keflex and nitrofurantoin and prefers ciprofloxacin.  Patient's hematuria may have been due to irritation from the catheter or due to her current UTI, although microscopy only showed 0-2 RBCs.  Urine culture was performed to ensure that the appropriate antibiotic was given in light of patient's frequent UTIs.  Would like to retest patient's urine in about 2 weeks after her infection has been treated to make sure that her hematuria goes away.  If it does not, she may need a referral to urology.

## 2019-02-05 NOTE — Assessment & Plan Note (Signed)
Patient appears to live with symptoms of a cystocele.  Will refer her to gynecology for reassessment of a pessary, since this may improve patient's quality of life.

## 2019-02-06 ENCOUNTER — Telehealth: Payer: Self-pay | Admitting: Family Medicine

## 2019-02-06 ENCOUNTER — Other Ambulatory Visit: Payer: Self-pay | Admitting: Family Medicine

## 2019-02-06 MED ORDER — SULFAMETHOXAZOLE-TRIMETHOPRIM 400-80 MG PO TABS: 2.0000 | ORAL_TABLET | Freq: Two times a day (BID) | ORAL | 0 refills | Status: AC

## 2019-02-06 NOTE — Progress Notes (Signed)
Bactrim sent d/t resistance to ciprofloxacin

## 2019-02-06 NOTE — Telephone Encounter (Signed)
Called patient and let her know that her urine culture grew E. Coli that is resistant to ciprofloxacin, which is the antibiotic she's been taking.  It is sensitive to Bactrim, however, so I told her that I am sending that prescription to her pharmacy.  I told her that she needs to take two pills twice daily for two weeks, and patient repeated these instructions back to me.

## 2019-02-10 DIAGNOSIS — H26493 Other secondary cataract, bilateral: Secondary | ICD-10-CM | POA: Diagnosis not present

## 2019-02-12 ENCOUNTER — Other Ambulatory Visit: Payer: Self-pay

## 2019-02-12 ENCOUNTER — Ambulatory Visit (INDEPENDENT_AMBULATORY_CARE_PROVIDER_SITE_OTHER): Payer: Medicare Other | Admitting: Family Medicine

## 2019-02-12 ENCOUNTER — Encounter: Payer: Self-pay | Admitting: Family Medicine

## 2019-02-12 VITALS — BP 122/64 | Temp 98.3°F | Wt 156.0 lb

## 2019-02-12 DIAGNOSIS — G959 Disease of spinal cord, unspecified: Secondary | ICD-10-CM | POA: Diagnosis not present

## 2019-02-12 DIAGNOSIS — D500 Iron deficiency anemia secondary to blood loss (chronic): Secondary | ICD-10-CM | POA: Diagnosis not present

## 2019-02-12 DIAGNOSIS — Z23 Encounter for immunization: Secondary | ICD-10-CM | POA: Diagnosis not present

## 2019-02-12 DIAGNOSIS — M5441 Lumbago with sciatica, right side: Secondary | ICD-10-CM

## 2019-02-12 DIAGNOSIS — M48062 Spinal stenosis, lumbar region with neurogenic claudication: Secondary | ICD-10-CM

## 2019-02-12 DIAGNOSIS — G629 Polyneuropathy, unspecified: Secondary | ICD-10-CM

## 2019-02-12 DIAGNOSIS — M4804 Spinal stenosis, thoracic region: Secondary | ICD-10-CM

## 2019-02-12 DIAGNOSIS — I10 Essential (primary) hypertension: Secondary | ICD-10-CM | POA: Diagnosis not present

## 2019-02-12 DIAGNOSIS — M150 Primary generalized (osteo)arthritis: Secondary | ICD-10-CM

## 2019-02-12 DIAGNOSIS — G8929 Other chronic pain: Secondary | ICD-10-CM

## 2019-02-12 DIAGNOSIS — M160 Bilateral primary osteoarthritis of hip: Secondary | ICD-10-CM

## 2019-02-12 DIAGNOSIS — M170 Bilateral primary osteoarthritis of knee: Secondary | ICD-10-CM

## 2019-02-12 DIAGNOSIS — N814 Uterovaginal prolapse, unspecified: Secondary | ICD-10-CM | POA: Diagnosis not present

## 2019-02-12 DIAGNOSIS — M159 Polyosteoarthritis, unspecified: Secondary | ICD-10-CM

## 2019-02-12 DIAGNOSIS — G5731 Lesion of lateral popliteal nerve, right lower limb: Secondary | ICD-10-CM

## 2019-02-12 DIAGNOSIS — M47812 Spondylosis without myelopathy or radiculopathy, cervical region: Secondary | ICD-10-CM

## 2019-02-12 MED ORDER — MICONAZOLE NITRATE 2 % VA CREA: 1.0000 | TOPICAL_CREAM | Freq: Every day | VAGINAL | 0 refills | Status: DC

## 2019-02-12 NOTE — Assessment & Plan Note (Addendum)
Established problem. Stable. Established problem Controlled and allowing pt ability to complete ADLs and iADLs in efficient fashion.  No significant ADE identifies. No aberrant behaviors demostrated Continue opioid analgesic current therapy regiment. Three monthly refills of hydrocodone/apa 10/325 #120 tabs/month - start date for the three monthly refills is 03/14/19.

## 2019-02-12 NOTE — Patient Instructions (Signed)
Your hemoglobin (red blood cells) look great.  It is at 11 gram/dL.  Your blood pressure is good.    Dr Meilani Edmundson will make sure your pain medication is refilled before 02/17/19.

## 2019-02-12 NOTE — Progress Notes (Signed)
Subjective:    Patient ID: Nicole Perez, female    DOB: Feb 17, 1933, 85 y.o.   MRN: [id number] Nicole Perez is accompanied by daughter Sources of clinical information for visit is/are patient and past medical records. Nursing assessment for this office visit was reviewed with the patient for accuracy and revision.   Previous Report(s) Reviewed: historical medical records  Depression screen [facility] 2/9 02/12/2019  Decreased Interest 0  Down, Depressed, Hopeless 0  PHQ - 2 Score 0  Some recent data might be hidden   Fall Risk  02/12/2019 02/03/2019 11/07/2018 08/07/2018 04/24/2018  Falls in the past year? 1 0 0 No No  Number falls in past yr: 0 - - - -  Injury with Fall? 0 - - - -  Risk Factor Category  - - - - -  Risk for fall due to : - - - - -  Risk for fall due to: Comment - - - - -    History/P.E. limitations: none  Adult vaccines due  Topic Date Due  . TETANUS/TDAP  07/10/2020   There are no preventive care reminders to display for this patient.  There are no preventive care reminders to display for this patient.   Chief Complaint  Patient presents with  . Abdominal Pain     HPI  Abdominal Pain - Duration: Intermittent for several months  - Management: takes hydrocodone - Severity: mild to moderate - Associated Symptoms: fullness in midline lower abdomen, No nausea/vomiting, no constipation (using stool softner), no jaundice    Compression myelopathy of lumbar spine - Duration: before 11/2017 - Management: hydrocodone 10/325 and Lyrica 25 mg prn TID - Severity: moderate with occasional severe - Associated Symptoms: fatigue, tiredness in legs, urinary retention, unstaedy gait   CHRONIC HYPERTENSION   Disease Monitoring  Blood pressure range: not checking at home  Chest pain: no   Dyspnea: no   Claudication: no   Medication compliance: yes  Medication Side Effects  Lightheadedness: no   Urinary frequency: no   Edema: no     Pelvic Prolapse - Duration:  before - Management: history of rectal prolaspe and cystocele after hyterectomy requiring anterior repair.  Rectal prolapse noted on Sagital MRI 2016 by [facility]. - Severity: mild-moderate - Associated Symptoms: fullness in perineum, no perineal pain - PMH: known cystocele - has been treated with pessary in past but stopped as pt developed neurogenic bladder  SH: smoking status reviewed.  Review of Systems See HPI    Objective:   Physical Exam  VS reviewed GEN: Alert, Cooperative, Groomed, NAD HEENT: PERRL; EAC bilaterally not occluded, TM's translucent with normal LM, (+) LR;               COR: RRR, No M/G/R LUNGS: BCTA, No Acc mm use, speaking in full sentences ABDOMEN: (+)BS, soft, NT, ND, No palpable masses  Gait: using rolling walker with slower speed, No significant path deviation, Step through +, no shuffle Psych: Normal affect/thought/speech/language    Assessment & Plan:  Visit Problem List with A/P  Compression myelopathy of lumbar region [facility]) Established problem. Stable. Continue current therapy   Cystocele with prolapse Established problem. Stable. Nicole Perez is not interested in pursuing a pessary intervention at this time. Monitor.    Iron deficiency anemia due to chronic blood loss CBC:    Component Value Date/Time   WBC 5.4 02/03/2019 1615   WBC 4.0 05/02/2017 0248   HGB 11.0 (L) 02/03/2019 1615   HCT  33.2 (L) 02/03/2019 1615   PLT 258 02/03/2019 1615   MCV 87 02/03/2019 1615   NEUTROABS 3.0 02/03/2019 1615   LYMPHSABS 1.8 02/03/2019 1615   MONOABS 0.3 04/09/2016 1646   EOSABS 0.2 02/03/2019 1615   BASOSABS 0.1 02/03/2019 1615   Established problem. Stable. Monitor periodically    Essential hypertension, benign Adequate blood pressure control.  No evidence of new end organ damage.  Tolerating medication without significant adverse effects.  Plan to continue current blood pressure regiment.

## 2019-02-13 ENCOUNTER — Encounter: Payer: Self-pay | Admitting: Family Medicine

## 2019-02-13 MED ORDER — HYDROCODONE-ACETAMINOPHEN 10-325 MG PO TABS: 1.0000 | ORAL_TABLET | Freq: Four times a day (QID) | ORAL | 0 refills | Status: DC | PRN

## 2019-02-13 NOTE — Assessment & Plan Note (Signed)
Adequate blood pressure control.  No evidence of new end organ damage.  Tolerating medication without significant adverse effects.  Plan to continue current blood pressure regiment.   

## 2019-02-13 NOTE — Assessment & Plan Note (Signed)
Established problem Controlled Continue current therapy regiment. Using lyrica on PRN basis.  Pt has found a OTC topical that helps with feet pain

## 2019-02-13 NOTE — Assessment & Plan Note (Signed)
Established problem. Stable. Ms Buck is not interested in pursuing a pessary intervention at this time. Monitor.

## 2019-02-13 NOTE — Assessment & Plan Note (Signed)
CBC:    Component Value Date/Time   WBC 5.4 02/03/2019 1615   WBC 4.0 05/02/2017 0248   HGB 11.0 (L) 02/03/2019 1615   HCT 33.2 (L) 02/03/2019 1615   PLT 258 02/03/2019 1615   MCV 87 02/03/2019 1615   NEUTROABS 3.0 02/03/2019 1615   LYMPHSABS 1.8 02/03/2019 1615   MONOABS 0.3 04/09/2016 1646   EOSABS 0.2 02/03/2019 1615   BASOSABS 0.1 02/03/2019 1615   Established problem. Stable. Monitor periodically

## 2019-02-23 ENCOUNTER — Encounter: Payer: Medicare Other | Admitting: Obstetrics & Gynecology

## 2019-02-23 NOTE — Progress Notes (Deleted)
   Patient did not show up today for her scheduled appointment.   UGONNA  ANYANWU, MD, FACOG Obstetrician & Gynecologist, [facility], [facility]  

## 2019-02-26 ENCOUNTER — Ambulatory Visit (INDEPENDENT_AMBULATORY_CARE_PROVIDER_SITE_OTHER): Payer: Medicare Other | Admitting: Family Medicine

## 2019-02-26 ENCOUNTER — Other Ambulatory Visit: Payer: Self-pay

## 2019-02-26 ENCOUNTER — Encounter: Payer: Self-pay | Admitting: Family Medicine

## 2019-02-26 VITALS — BP 132/70 | HR 93 | Temp 98.2°F | Wt 159.0 lb

## 2019-02-26 DIAGNOSIS — Z79891 Long term (current) use of opiate analgesic: Secondary | ICD-10-CM | POA: Diagnosis not present

## 2019-02-26 DIAGNOSIS — G894 Chronic pain syndrome: Secondary | ICD-10-CM | POA: Diagnosis not present

## 2019-02-26 DIAGNOSIS — G4700 Insomnia, unspecified: Secondary | ICD-10-CM | POA: Diagnosis not present

## 2019-02-26 DIAGNOSIS — N8183 Incompetence or weakening of rectovaginal tissue: Secondary | ICD-10-CM

## 2019-02-26 DIAGNOSIS — K648 Other hemorrhoids: Secondary | ICD-10-CM

## 2019-02-26 DIAGNOSIS — F5104 Psychophysiologic insomnia: Secondary | ICD-10-CM

## 2019-02-26 DIAGNOSIS — G629 Polyneuropathy, unspecified: Secondary | ICD-10-CM | POA: Diagnosis not present

## 2019-02-26 MED ORDER — TRAZODONE HCL 50 MG PO TABS: 25.0000 mg | ORAL_TABLET | Freq: Every evening | ORAL | 3 refills | Status: DC | PRN

## 2019-02-26 NOTE — Patient Instructions (Signed)
Please try this medicine called trazadone 50 mg tablet.  Take a half tablet one hour before bedtime.  If it is not causing adie effects but you still need more help with sleep, then start taking a whole tablets.

## 2019-02-27 ENCOUNTER — Encounter: Payer: Self-pay | Admitting: Family Medicine

## 2019-02-27 DIAGNOSIS — G894 Chronic pain syndrome: Secondary | ICD-10-CM

## 2019-02-27 HISTORY — DX: Chronic pain syndrome: G89.4

## 2019-02-27 NOTE — Assessment & Plan Note (Signed)
Established problem. Stable. 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   Continue current therapy Hydrocone/apap 10/325 q 6 hr prn, #120/month Tolerating opioid therapy.  No evidence of aberant behavior on [place] CSRS DB.  Continue Pregabalin 25 mg TID. Tolerating well with good benefits to analgesia

## 2019-02-27 NOTE — Assessment & Plan Note (Addendum)
Established problem Active problem for patient, primarily bc she does not like to be awake at not, not necessarily because she has daytime drowsiness or it is interfering with her activities. Follows sleep hygiene rules except does not get out of bed if not sleeping.  Some contribution to insomnia from General Medical Disorder (Painful sensorineural polyneuropathy) but also had a preceding insomnia disorder extending back to 2009 or 2010.  Need to explore if some of her 'leg numbness" could be RLS.  Will try trial of trazodone 25-50 mg qhs prn.  Continue non-pharmacoligic measures.  Discuss getting out of bed until tired.  Check RLS screen next ov in 3 months.

## 2019-02-27 NOTE — Assessment & Plan Note (Signed)
Established problem. Stable. 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   Continue current therapy Hydrocone/apap 10/325 q 6 hr prn, #120/month Tolerating opioid therapy.  No evidence of aberant behavior on [place] CSRS DB.  Continue Pregabalin 25 mg TID. Tolerating well with good benefits to analgesia

## 2019-02-27 NOTE — Assessment & Plan Note (Signed)
Established problem. Stable. 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   Continue current therapy Hydrocone/apap 10/325 q 6 hr prn, #120/month Tolerating opioid therapy.  No evidence of aberant behavior on [place] CSRS DB.  Continue Pregabalin 25 mg TID. Tolerating well with good benefits to analgesia

## 2019-02-27 NOTE — Assessment & Plan Note (Signed)
Established problem. Stable. I am now uncertain if Nicole Perez is describing a grade 3 internal hemorrhoid or rectal prolapse.  We agreed that as long as she is able to push the extruded part back up into pelvis, will just monitor.  We also agreed that if she becomes unable to restore the lower pelvis then she will let our office know and she would agree to see a colorectal surgeon.

## 2019-02-27 NOTE — Progress Notes (Signed)
Subjective:    Patient ID: Nicole Perez, female    DOB: [birth year], 85 y.o.   MRN: [id number] Nicole Perez is alone Sources of clinical information for visit is/are patient and past medical records. Nursing assessment for this office visit was reviewed with the patient for accuracy and revision.   Previous Report(s) Reviewed: historical medical records  Depression screen [facility] 2/9 02/12/2019  Decreased Interest 0  Down, Depressed, Hopeless 0  PHQ - 2 Score 0  Some recent data might be hidden   Fall Risk  02/12/2019 02/03/2019 11/07/2018 08/07/2018 04/24/2018  Falls in the past year? 1 0 0 No No  Number falls in past yr: 0 - - - -  Injury with Fall? 0 - - - -  Risk Factor Category  - - - - -  Risk for fall due to : - - - - -  Risk for fall due to: Comment - - - - -    History/P.E. limitations: mild dementia  Adult vaccines due  Topic Date Due  . TETANUS/TDAP  07/10/2020   There are no preventive care reminders to display for this patient. There are no preventive care reminders to display for this patient.   Chief Complaint  Patient presents with  . Annual Exam  . leg numbness     HPI  Leg numbness - Present of several years now - Pins n' Needles feeling in both feet and lower legs.   - No numbness in hands.  No weakness in feet or legs.  - Ms Menton has known very severe of osteoarthritis of thoracolumbar spine with myelopathy. Dr Jerilynn Mages. Dumonski has said that he would operate only if it were an emergency situation.  - Dr Normajean Glasgow ([facility]) has provided intra-articular spinae injections in past  - Interferes with falling and maintaining sleep  - Good relief from hydrocodone (admits she occasionally take an extra half tablet at bedtime if her limbs are activng up severely. - She rubs an OTC cream "Magnilife" that lists eucalyptis, menthol, methy salicylate that helpse  - She believes the Lyrica 25 mg TID also helps. She know this is true when she misses a dose.  -    Pelvic  Prolapse - Recent problem with descent of a fullness in area of anus after defecation - She is always able to manually restore the fullness into the pelvis - She denies pain with these defecatory events.  - Patient has known recent histry of cystocele with bladder descending to vaginal introitus with increase intraabdominal pressure.  Pt has failed pessary therapy previously for this problem..  The bladder has never trespassed the introitus.  These events are not painful either.  - Patient has had rectal prolapse and cystocele anterior repairs after hysterectomy by Dr Carmine Savoy.  - Pt has been seen by Dr Marcello Moores (colorectal surgeon) at [facility] for rectal pain in 01/ Apr 03, 2014. Dr Marcello Moores did not see rectal prolapse but did see a paritlly thrombosed internal hemorrhoid she planned to band.  Dr Yong [facility] resident had referred pt for "rectal prolapse" after CT abdomen showed possible rectal prolapse.    Insomnia - onset several years ago, worsened after death of her husband around 04/03/2009.  - both DFA and EMA - Is awaken by chronic painful neuropathy pain worsening at night in bed. She takes hydrocodone 10 mg regularly at bedtime for neuropathic pain.  - Lays down 10:30 pm, gets up around 10:30 am. - PCP around 2009/04/03 tried duloxetine  for perhaps depression-related insomnia without help.  - She denies being awoken by pain, SHOB.  She self-caths around 4:00 am.   - She occasionally naps in the day.   - She denies feeling sleepy during the day.  - She lives alone - no knowledge of snoring. She denies gasping awake.  - No TV/Monitor screen in bedroom.  - She has not tried OTC sleep aids or herbals.  SH: No smoking  Review of Systems See hpi    Objective:   Physical Exam  VS reviewed GEN: Alert, Cooperative, Groomed, NAD HEENT: PERRL; EAC bilaterally not occluded, TM's translucent with normal LM, (+) LR                COR: RRR, No M/G/R LUNGS: BCTA, No Acc mm use, speaking in full  sentences  GU: Deferred EXT: No peripheral leg edema Gait: Adequate pace speed for walking with sliding walker, No significant path deviation, Not complete step thru Psych: Normal affect/thought/speech/language    Assessment & Plan:  Visit Problem List with A/P  Hemorrhoid prolapse Established problem. Stable. I am now uncertain if Ms Appleby is describing a grade 3 internal hemorrhoid or rectal prolapse.  We agreed that as long as she is able to push the extruded part back up into pelvis, will just monitor.  We also agreed that if she becomes unable to restore the lower pelvis then she will let our office know and she would agree to see a colorectal surgeon.

## 2019-04-15 ENCOUNTER — Telehealth: Payer: Self-pay | Admitting: *Deleted

## 2019-04-15 ENCOUNTER — Other Ambulatory Visit: Payer: Self-pay | Admitting: Family Medicine

## 2019-04-15 DIAGNOSIS — N3001 Acute cystitis with hematuria: Secondary | ICD-10-CM

## 2019-04-15 NOTE — Telephone Encounter (Signed)
Pt calls requesting a refill of her abx.  She states that she is burning when she urinates.  Will forward to MD.  Christen Bame, CMA

## 2019-04-16 DIAGNOSIS — R339 Retention of urine, unspecified: Secondary | ICD-10-CM | POA: Diagnosis not present

## 2019-04-17 MED ORDER — CIPROFLOXACIN HCL 100 MG PO TABS: 250.0000 mg | ORAL_TABLET | Freq: Two times a day (BID) | ORAL | 0 refills | Status: AC

## 2019-04-17 NOTE — Telephone Encounter (Signed)
Patient informed that medication has been called into the pharmacy.  Briasia Flinders,CMA

## 2019-04-17 NOTE — Telephone Encounter (Signed)
Please let patient know ciprofloxacin sent to her pharmacy.  She is too let us know if she has not seen at least some improvement after three days of ciprofloxacin therapy.

## 2019-04-17 NOTE — Assessment & Plan Note (Signed)
Complaining of dysuria Trial cipro 250 mg BID x 5 days empiric. Pt to inform us if no improvement after 3 days.

## 2019-05-14 ENCOUNTER — Telehealth: Payer: Self-pay | Admitting: *Deleted

## 2019-05-14 DIAGNOSIS — N390 Urinary tract infection, site not specified: Secondary | ICD-10-CM

## 2019-05-14 MED ORDER — SULFAMETHOXAZOLE-TRIMETHOPRIM 800-160 MG PO TABS: 1.0000 | ORAL_TABLET | Freq: Two times a day (BID) | ORAL | 0 refills | Status: AC

## 2019-05-14 NOTE — Telephone Encounter (Signed)
Please let pt know abx sent into pharmacy

## 2019-05-14 NOTE — Telephone Encounter (Signed)
Pt calls because she is having dysuria again.  She wants to know if Dr. McDiarmid will call in Sulfamethoxazole-Trimethoprim 400-80 MG this time.  She states that when Dr. Shan Levans called that in fer her she did not having "burning for a long time".  Will forward to MD.  Christen Bame, CMA

## 2019-05-14 NOTE — Telephone Encounter (Signed)
A/ possilbe UTI P/ Septra DS tab, 1 tab BID x 5 d

## 2019-05-14 NOTE — Telephone Encounter (Signed)
Pt called back and was informed. Juandiego Kolenovic, CMA  

## 2019-05-21 ENCOUNTER — Other Ambulatory Visit: Payer: Self-pay | Admitting: Family Medicine

## 2019-05-21 DIAGNOSIS — I10 Essential (primary) hypertension: Secondary | ICD-10-CM

## 2019-05-27 DIAGNOSIS — M25552 Pain in left hip: Secondary | ICD-10-CM | POA: Diagnosis not present

## 2019-06-01 ENCOUNTER — Other Ambulatory Visit: Payer: Self-pay | Admitting: *Deleted

## 2019-06-01 DIAGNOSIS — M48062 Spinal stenosis, lumbar region with neurogenic claudication: Secondary | ICD-10-CM

## 2019-06-01 DIAGNOSIS — M47812 Spondylosis without myelopathy or radiculopathy, cervical region: Secondary | ICD-10-CM

## 2019-06-01 DIAGNOSIS — G629 Polyneuropathy, unspecified: Secondary | ICD-10-CM

## 2019-06-01 DIAGNOSIS — G959 Disease of spinal cord, unspecified: Secondary | ICD-10-CM

## 2019-06-01 DIAGNOSIS — G8929 Other chronic pain: Secondary | ICD-10-CM

## 2019-06-01 DIAGNOSIS — M4804 Spinal stenosis, thoracic region: Secondary | ICD-10-CM

## 2019-06-01 DIAGNOSIS — M159 Polyosteoarthritis, unspecified: Secondary | ICD-10-CM

## 2019-06-01 DIAGNOSIS — G5731 Lesion of lateral popliteal nerve, right lower limb: Secondary | ICD-10-CM

## 2019-06-01 DIAGNOSIS — M160 Bilateral primary osteoarthritis of hip: Secondary | ICD-10-CM

## 2019-06-01 DIAGNOSIS — M5441 Lumbago with sciatica, right side: Secondary | ICD-10-CM

## 2019-06-01 DIAGNOSIS — M170 Bilateral primary osteoarthritis of knee: Secondary | ICD-10-CM

## 2019-06-01 NOTE — Telephone Encounter (Signed)
Pt called to check status.  She is out of medication.  She thought I said that I would send in medication when we spoke earlier, explained that I could not send that in and I meant that I was sending request to MD.  Educated pt on not waiting till she runs out of medication.  Will forward to MD. Christen Bame, CMA

## 2019-06-02 MED ORDER — HYDROCODONE-ACETAMINOPHEN 10-325 MG PO TABS: 1.0000 | ORAL_TABLET | Freq: Four times a day (QID) | ORAL | 0 refills | Status: DC | PRN

## 2019-06-08 DIAGNOSIS — M545 Low back pain: Secondary | ICD-10-CM | POA: Diagnosis not present

## 2019-06-08 DIAGNOSIS — M48061 Spinal stenosis, lumbar region without neurogenic claudication: Secondary | ICD-10-CM | POA: Diagnosis not present

## 2019-06-10 DIAGNOSIS — M48061 Spinal stenosis, lumbar region without neurogenic claudication: Secondary | ICD-10-CM | POA: Diagnosis not present

## 2019-06-10 DIAGNOSIS — M545 Low back pain: Secondary | ICD-10-CM | POA: Diagnosis not present

## 2019-06-16 DIAGNOSIS — R339 Retention of urine, unspecified: Secondary | ICD-10-CM | POA: Diagnosis not present

## 2019-06-16 DIAGNOSIS — N3020 Other chronic cystitis without hematuria: Secondary | ICD-10-CM | POA: Diagnosis not present

## 2019-06-29 ENCOUNTER — Encounter: Payer: Self-pay | Admitting: Family Medicine

## 2019-06-29 DIAGNOSIS — N3946 Mixed incontinence: Secondary | ICD-10-CM | POA: Insufficient documentation

## 2019-06-29 DIAGNOSIS — N3020 Other chronic cystitis without hematuria: Secondary | ICD-10-CM

## 2019-06-29 DIAGNOSIS — R339 Retention of urine, unspecified: Secondary | ICD-10-CM

## 2019-06-29 HISTORY — DX: Mixed incontinence: N39.46

## 2019-06-29 HISTORY — DX: Other chronic cystitis without hematuria: N30.20

## 2019-06-29 HISTORY — DX: Retention of urine, unspecified: R33.9

## 2019-06-29 NOTE — Progress Notes (Unsigned)
Office visit note 06/16/2019 [facility]  Pt presenting spontaneous large volume urine leakage that began after spinal injection six days PTV.  A/ Chronic Cystitis       Mixed Incontinence      Urinary retention  May be secondary to spinal injection or possible infection (Cath'd UA: (+) nitrite, mod bacteria, LE negative;  0-5 WBC, 0-5 RBC) Urine sent for cx Start empiric doxycycline x 1 wk Continue self-cath's F/U in two weeks for reassessment of sxs

## 2019-07-03 DIAGNOSIS — N3020 Other chronic cystitis without hematuria: Secondary | ICD-10-CM | POA: Diagnosis not present

## 2019-07-03 DIAGNOSIS — R8271 Bacteriuria: Secondary | ICD-10-CM | POA: Diagnosis not present

## 2019-07-08 DIAGNOSIS — M48061 Spinal stenosis, lumbar region without neurogenic claudication: Secondary | ICD-10-CM | POA: Diagnosis not present

## 2019-07-08 DIAGNOSIS — M545 Low back pain: Secondary | ICD-10-CM | POA: Diagnosis not present

## 2019-07-09 ENCOUNTER — Other Ambulatory Visit: Payer: Self-pay

## 2019-07-09 ENCOUNTER — Ambulatory Visit (INDEPENDENT_AMBULATORY_CARE_PROVIDER_SITE_OTHER): Payer: Medicare Other | Admitting: Family Medicine

## 2019-07-09 ENCOUNTER — Encounter: Payer: Self-pay | Admitting: Family Medicine

## 2019-07-09 ENCOUNTER — Ambulatory Visit: Payer: Medicare Other | Admitting: Family Medicine

## 2019-07-09 VITALS — BP 122/74 | HR 79

## 2019-07-09 DIAGNOSIS — R7989 Other specified abnormal findings of blood chemistry: Secondary | ICD-10-CM | POA: Diagnosis not present

## 2019-07-09 DIAGNOSIS — D500 Iron deficiency anemia secondary to blood loss (chronic): Secondary | ICD-10-CM | POA: Diagnosis not present

## 2019-07-09 DIAGNOSIS — Z79899 Other long term (current) drug therapy: Secondary | ICD-10-CM

## 2019-07-09 DIAGNOSIS — I10 Essential (primary) hypertension: Secondary | ICD-10-CM | POA: Diagnosis not present

## 2019-07-09 DIAGNOSIS — N319 Neuromuscular dysfunction of bladder, unspecified: Secondary | ICD-10-CM

## 2019-07-09 DIAGNOSIS — G629 Polyneuropathy, unspecified: Secondary | ICD-10-CM | POA: Diagnosis not present

## 2019-07-09 DIAGNOSIS — K31819 Angiodysplasia of stomach and duodenum without bleeding: Secondary | ICD-10-CM

## 2019-07-09 DIAGNOSIS — G959 Disease of spinal cord, unspecified: Secondary | ICD-10-CM

## 2019-07-09 NOTE — Patient Instructions (Signed)
Your pain medication is available to get now. You may ask the pharmacy to refill the pain medicine again in 30 days.  We are checking your hemoglobin  For anemia, your kidneys, electrolytes and blood sugar.   We will wait and see what Dr Percell Miller thinks once he sees your back MRI.

## 2019-07-10 LAB — BASIC METABOLIC PANEL
BUN/Creatinine Ratio: 27 (ref 12–28)
BUN: 30 mg/dL — ABNORMAL HIGH (ref 8–27)
CO2: 24 mmol/L (ref 20–29)
Calcium: 9.7 mg/dL (ref 8.7–10.3)
Chloride: 103 mmol/L (ref 96–106)
Creatinine, Ser: 1.11 mg/dL — ABNORMAL HIGH (ref 0.57–1.00)
GFR calc Af Amer: 53 mL/min/{1.73_m2} — ABNORMAL LOW (ref >59)
GFR calc non Af Amer: 46 mL/min/{1.73_m2} — ABNORMAL LOW (ref >59)
Glucose: 87 mg/dL (ref 65–99)
Potassium: 4.5 mmol/L (ref 3.5–5.2)
Sodium: 143 mmol/L (ref 134–144)

## 2019-07-10 LAB — CBC
Hematocrit: 33.9 % — ABNORMAL LOW (ref 34.0–46.6)
Hemoglobin: 11.2 g/dL (ref 11.1–15.9)
MCH: 29.2 pg (ref 26.6–33.0)
MCHC: 33 g/dL (ref 31.5–35.7)
MCV: 89 fL (ref 79–97)
Platelets: 205 10*3/uL (ref 150–450)
RBC: 3.83 x10E6/uL (ref 3.77–5.28)
RDW: 12.7 % (ref 11.7–15.4)
WBC: 4 10*3/uL (ref 3.4–10.8)

## 2019-07-10 LAB — TSH: TSH: 4.06 u[IU]/mL (ref 0.450–4.500)

## 2019-07-12 ENCOUNTER — Encounter: Payer: Self-pay | Admitting: Family Medicine

## 2019-07-12 NOTE — Assessment & Plan Note (Signed)
Adequate blood pressure control.  No evidence of new end organ damage.  Tolerating medication without significant adverse effects.  Plan to continue currenthctz 25mg  dailyt.

## 2019-07-12 NOTE — Assessment & Plan Note (Signed)
Established problem. Stable.  lLngth-dependent sensorimotor polyneuropathy bilaterally, along with contribution from spinal stenosis  Continue current therapy Hydrocone/apap 10/325 q 6 hr prn, #120/month Tolerating opioid therapy.  No evidence of aberant behavior on [place] CSRS DB.  Continue Pregabalin 25 mg TID- pt actually taking prn 2 to 3 times a week.. Tolerating well with fair benefits to analgesia in legs.  Pt

## 2019-07-12 NOTE — Progress Notes (Signed)
Subjective:    Patient ID: Nicole Perez, female    DOB: 1933-12-14, 85 y.o.   MRN: [id number] Nicole Perez is alone Sources of clinical information for visit is/are patient and past medical records. Nursing assessment for this office visit was reviewed with the patient for accuracy and revision.   Previous Report(s) Reviewed: historical medical records  Depression screen [facility] 2/9 07/09/2019  Decreased Interest 0  Down, Depressed, Hopeless 0  PHQ - 2 Score 0  Some recent data might be hidden   Fall Risk  07/09/2019 02/12/2019 02/03/2019 11/07/2018 08/07/2018  Falls in the past year? 0 1 0 0 No  Number falls in past yr: - 0 - - -  Injury with Fall? - 0 - - -  Risk Factor Category  - - - - -  Risk for fall due to : - - - - -  Risk for fall due to: Comment - - - - -    History/P.E. limitations: mild memory impairment  Adult vaccines due  Topic Date Due  . TETANUS/TDAP  07/10/2020   There are no preventive care reminders to display for this patient. There are no preventive care reminders to display for this patient.   Chief Complaint  Patient presents with  . Back Pain     HPI  Neurogenc Bladder w retention - long standing issue - secondary to compression myelopathy - recent episode of large volume urinary incontne nce after PM&R spinal column inkection (?epidural) and/or possible uti.  Pt consulted [facility] where tx'd possible uti with doxy 1 week.  F/U app't showed improved symptoms. Ur cx grew E coli that was sens beta-lactam and tmp/smx, but resist to Tet, fluroquin's. - continues self-cath - No ur sxs current - No fever/chills. No new back pain. No n/v  Compressive myelopathy thoracolumbar region - mix of hern discs and osteoarthrosis of spinal column - Recent worsening pain with little response to oral opioids Tx'd by [facility] with spinal injection ?type, at [employer] [facility] [employer]  - pt has had intraarticular spinal column injection by Normajean Glasgow ([place]) - see above for possible adverse effect from recent inj. Raliegh Ip ordered spinal column MRI for her chronic back pain  Painful peripheral Neuropathy - longstanding issue, related to coopressive myelopathy and radiculopathy -some  conrtolled of pain with pregabalin 25 mg T ID prn, she takes two to three times a week. Sedating. -Able to walk with walker.  Able sit-stand unassisted x walker.  opiod therapy.  - No falls since last visit. - Pain worse when not taking.   Able to perform ADLs independently with tx -  Has been seeking nonopioid therapeutic options for pain contol. - no adverse symptoms of opioids - no display abberant  Behaviors.  PDMP reviewed.  CHRONIC HYPERTENSION  Disease Monitoring  Checking Blood pressure at home: [place] check [id number] abt.    Chest pain: no   Dyspnea: no   Claudication: no   Medication compliance: yes, hctz 25 mg daily  Medication Side Effects  Lightheadedness: no   Urinary frequency: no, not currently   Edema: no   Palpitations: no   Preventitive Healthcare:  Exercise: no   Diet type: regular  Salt Restriction: no    SH: lives alone, family very supportive  Review of Systems No cough No  shob No headache See  hpi    Objective:   Physical Exam VS reviewed GEN: Alert, Cooperative, Groomed, NAD  HEENT:  EAC bilaterally not occluded COR: RRR, No M/G/R EXT: no ankleedema LUNGS: BCTA, No Acc mm use, speaking in full sentence  Gait: Sit-stand using walker, Normal speed w walker, No significant path deviation, slight Step through +,  Psych: Normal affect/thought/speech/language    Assessment & Plan:

## 2019-07-12 NOTE — Assessment & Plan Note (Signed)
Established problem Recent exac with uti/adverse event after spinal injection or both.   Controlled Continue practice of tid self-cath. Interestinly, empiric tx doxy helped urinary incont sx thought related to e coli (tet resistant) uti

## 2019-07-12 NOTE — Assessment & Plan Note (Addendum)
Established problem. Stable though back pain contoll may be less effective per pain ratings.    Pt remains functional. Little response to spianl inj proc. At [facility]. New consuttion orthopedics Raliegh Ip w LS spine MRI pending. . Continue current therapy PDMP reviewed.  No red flags.  May refill 3 months next request.

## 2019-07-12 NOTE — Assessment & Plan Note (Signed)
Established problem No sign/sxs GI bleed. Check cbc today.  No longer on iron tab suppl

## 2019-07-23 ENCOUNTER — Ambulatory Visit (INDEPENDENT_AMBULATORY_CARE_PROVIDER_SITE_OTHER): Payer: Medicare Other | Admitting: Family Medicine

## 2019-07-23 ENCOUNTER — Other Ambulatory Visit: Payer: Self-pay

## 2019-07-23 ENCOUNTER — Telehealth (INDEPENDENT_AMBULATORY_CARE_PROVIDER_SITE_OTHER): Payer: Medicare Other | Admitting: Family Medicine

## 2019-07-23 VITALS — BP 124/68 | HR 100 | Wt 161.4 lb

## 2019-07-23 DIAGNOSIS — R5383 Other fatigue: Secondary | ICD-10-CM

## 2019-07-23 MED ORDER — PREDNISONE 10 MG PO TABS: ORAL_TABLET | ORAL | 0 refills | Status: AC

## 2019-07-23 NOTE — Progress Notes (Signed)
Subjective:    Nicole Perez - 85 y.o. female MRN [id number]  Date of birth: [birth year]  CC:  Nicole Perez is here for fatigue.  HPI:  Fatigue Patient reports that she was prescribed a 6-day course of prednisone about 2 weeks ago by her spine doctor to help alleviate her lower back pain.  She took it as prescribed, but the morning after her last dose, she felt completely "wiped out."  She denies any shortness of breath, chest pain, abdominal pain, urinary symptoms, or lightheadedness.  She says that she just lacks energy.  She denies focal or generalized weakness.  She denies changes in her food and fluid intake.  She denies melena or bright red blood per rectum.  There have been no other recent medication changes.  Health Maintenance: Health Maintenance Due  Topic Date Due  . INFLUENZA VACCINE  07/18/2019    -  reports that she quit smoking about 35 years ago. She has a 5.00 pack-year smoking history. She has quit using smokeless tobacco. - Review of Systems: Per HPI. - Past Medical History: Patient Active Problem List   Diagnosis Date Noted  . Chronic cystitis 06/29/2019  . Chronic pain syndrome 02/27/2019  . Recurrent UTI 04/24/2018  . TSH elevation 08/13/2017  . Encounter for long-term opiate analgesic use 05/30/2017  . 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[place]) 05/30/2017  . Sinoatrial block   . Impaired functional mobility, balance, gait, and endurance 03/25/2017  . Hemorrhoid prolapse 11/16/2016  . Iron deficiency anemia due to chronic blood loss 09/14/2016  . AVM (arteriovenous malformation) of duodenum, acquired 04/09/2016  . Dry eye syndrome 11/16/2014  . Glaucoma suspect, bilateral 11/16/2014  . Peroneal neuropathy 09/30/2014  . Cervical spondylosis without myelopathy 08/07/2014  . At risk for falls 08/06/2014  . Memory impairment 08/06/2014  . Peripheral painful Neuropathy ([place]) 08/06/2014  . Bladder neurogenous 02/09/2014  . Osteoarthritis, multiple sites  08/11/2013  . Cystocele with prolapse 08/11/2013  . Osteoarthritis of both knees 04/22/2013  . Obesity, unspecified 04/22/2013  . Vitamin D deficiency 09/30/2012  . Fatigue 07/30/2012  . Hearing loss sensory, bilateral 07/26/2011  . Spinal stenosis of thoracic region 07/26/2011  . Spinal stenosis of lumbar region 07/26/2011  . Lichen sclerosus et atrophicus of the vulva   . Pure hypercholesterolemia 05/10/2010  . Osteoarthritis of spine with myelopathy, thoracolumbar region 04/21/2010  . Insomnia disorder, psychophysiologic type 04/18/2010  . Essential hypertension, benign 04/18/2010  . Chronic back pain 04/18/2010  . Osteoarthritis of both hips 09/11/2007   - Medications: reviewed and updated   Objective:   Physical Exam BP 124/68   Pulse 100   Wt 161 lb 6.4 oz (73.2 kg)   LMP 12/17/1962   SpO2 97%   BMI 32.60 kg/m  Gen: NAD, alert, cooperative with exam, well-appearing CV: RRR, good S1/S2, no murmur, capillary refill brisk  Resp: CTABL, no wheezes, non-labored Abd: SNTND, BS present, no guarding or organomegaly Skin: no rashes, normal turgor  Neuro: no gross deficits  Psych: good insight, alert and oriented    Assessment & Plan:   Fatigue Her symptoms certainly have a broad differential, but it is reassuring that she has no signs of systemic illness, her vital signs appear normal, and a large variety of labs, including TSH, CBC, and BMP, were performed about 2 weeks ago which were within normal limits.  It is possible that her fatigue is due to withdrawal from prednisone, so we will try a longer taper of  this medication.  Patient was told that if her fatigue does not improve after taking this medication, she should come back in to be evaluated.  She was also given return precautions, including chest pain and shortness of breath.    Maia Breslow, M.D. 07/24/2019, 9:37 AM PGY-3, [place]

## 2019-07-23 NOTE — Patient Instructions (Signed)
It was nice meeting you today [name]!  Today, we are restarting the prednisone that you were taking earlier, but we will take it for a much longer time and wean off of it very slowly so that you will not have any withdrawal side effects.  If you are fatigue does not improve with this medication, please let us know.  If you develop chest pain or shortness of breath along with your fatigue, please give [place] a call.  Please follow the instructions very carefully on the medicine bottle.  If you have any questions or concerns, please feel free to call the clinic.   Be well,  Dr. Shan Levans

## 2019-07-23 NOTE — Progress Notes (Signed)
[place] Telemedicine Visit  Patient consented to have virtual visit. Method of visit: Telephone  Encounter participants: Patient: Nicole Perez - located at home Provider: Bonnita Hollow - located at office Others (if applicable): Daughter - [name]  Chief Complaint: fatigue  HPI: Nicole Perez is a 85 y.o. female who presents with 1 week of feeling tired and fatigued.  Patient said that this occurred after completing a course of prednisone which he was taking for back pain.  Patient denies any infectious symptoms including fevers, chills, shortness of breath, rash.  Patient is tolerating p.o. well.  ROS: per HPI  Pertinent PMHx: Noncontributory  Exam:  Respiratory: Able to speak in full sentences without issue  Assessment/Plan: Fatigue Generalized fatigue without any infectious symptoms.  Occurred after taking prednisone, possibly ending without tapering?  Given the vagueness of symptoms, recommended patient come in to be seen for further evaluation and physical exam.  Patient scheduled at 3:10 PM and access to care No problem-specific Assessment & Plan notes found for this encounter.    Time spent during visit with patient: 5 minutes

## 2019-07-24 NOTE — Assessment & Plan Note (Addendum)
Her symptoms certainly have a broad differential, but it is reassuring that she has no signs of systemic illness, her vital signs appear normal, and a large variety of labs, including TSH, CBC, and BMP, were performed about 2 weeks ago which were within normal limits.  It is possible that her fatigue is due to withdrawal from prednisone, so we will try a longer taper of this medication.  Patient was told that if her fatigue does not improve after taking this medication, she should come back in to be evaluated.  She was also given return precautions, including chest pain and shortness of breath.

## 2019-08-01 DIAGNOSIS — M545 Low back pain: Secondary | ICD-10-CM | POA: Diagnosis not present

## 2019-08-07 DIAGNOSIS — N3020 Other chronic cystitis without hematuria: Secondary | ICD-10-CM | POA: Diagnosis not present

## 2019-08-07 DIAGNOSIS — R339 Retention of urine, unspecified: Secondary | ICD-10-CM | POA: Diagnosis not present

## 2019-08-11 DIAGNOSIS — M48061 Spinal stenosis, lumbar region without neurogenic claudication: Secondary | ICD-10-CM | POA: Diagnosis not present

## 2019-08-11 DIAGNOSIS — M545 Low back pain: Secondary | ICD-10-CM | POA: Diagnosis not present

## 2019-08-13 ENCOUNTER — Telehealth: Payer: Self-pay | Admitting: *Deleted

## 2019-08-13 NOTE — Telephone Encounter (Signed)
Request to schedule her for tomorrow early afternoon for telephone visit with Aranda Bihm

## 2019-08-13 NOTE — Telephone Encounter (Signed)
Pt states that she still "feels tired all the time and has since my last visit".  We have no openings today or tomorrow. Will forward to PCP for next steps.  Christen Bame, CMA

## 2019-08-14 ENCOUNTER — Other Ambulatory Visit: Payer: Self-pay

## 2019-08-14 ENCOUNTER — Encounter: Payer: Self-pay | Admitting: Family Medicine

## 2019-08-14 ENCOUNTER — Telehealth (INDEPENDENT_AMBULATORY_CARE_PROVIDER_SITE_OTHER): Payer: Medicare Other | Admitting: Family Medicine

## 2019-08-14 DIAGNOSIS — K31819 Angiodysplasia of stomach and duodenum without bleeding: Secondary | ICD-10-CM

## 2019-08-14 DIAGNOSIS — N183 Chronic kidney disease, stage 3 unspecified: Secondary | ICD-10-CM

## 2019-08-14 DIAGNOSIS — R5383 Other fatigue: Secondary | ICD-10-CM

## 2019-08-14 DIAGNOSIS — G8929 Other chronic pain: Secondary | ICD-10-CM

## 2019-08-14 DIAGNOSIS — D500 Iron deficiency anemia secondary to blood loss (chronic): Secondary | ICD-10-CM

## 2019-08-14 DIAGNOSIS — G894 Chronic pain syndrome: Secondary | ICD-10-CM

## 2019-08-14 DIAGNOSIS — F1190 Opioid use, unspecified, uncomplicated: Secondary | ICD-10-CM | POA: Insufficient documentation

## 2019-08-14 DIAGNOSIS — Z79891 Long term (current) use of opiate analgesic: Secondary | ICD-10-CM

## 2019-08-14 DIAGNOSIS — Z79899 Other long term (current) drug therapy: Secondary | ICD-10-CM

## 2019-08-14 HISTORY — DX: Chronic kidney disease, stage 3 unspecified: N18.30

## 2019-08-14 NOTE — Addendum Note (Signed)
Addended by: Lissa Morales D on: 08/14/2019 03:27 PM   Modules accepted: Orders

## 2019-08-14 NOTE — Assessment & Plan Note (Signed)
Ongoing new problem without clear causation. While may be spontaneously resolving given patient's description of feeling better today, will need to follow this up.  Requested Ms Snelling come into [facility] lab at [place] hrs on 08/17/19 for a morning serum cortisol.  This should help with this question of adrenal insufficiency as source of pt's fatigue.   Will schedule Ms Senters in my clinic 08/20/19 morning for in-person evaluation of this complaint.  Is there a role for opiate withdrawal in this week's fatigue complaint? Will request urine opiate assay on 08/17/19 as well.

## 2019-08-14 NOTE — Progress Notes (Signed)
[place] / E- Visit   This visit type was conducted due to national recommendations for restrictions regarding the COVID-19 Pandemic (e.g. social distancing) in an effort to limit this patient's exposure and mitigate transmission in our community.   Due to their co-morbid illnesses, this patient is at least at moderate risk for complications without adequate follow up: yes   This format is felt to be most appropriate for this patient at this time.  All issues noted in this document were discussed and addressed.  A limited physical exam was performed with this format.   Patient consented to have visit conducted via telepnone  Encounter participants: Patient: Nicole Perez  Patient Location: Home Provider: Sherren Mocha Guage Perez at office  Others (if applicable): dgt [facility] ov medical records reviewed Chief Complaint: "tired all the time"  HPI: FATIGUE  Onset: Patient believes onset of fatigue was after she received a Prednisone dose pack around the time of some type of spinal therapeutic injection on or around 06/10/19 for chronic back pain.     She was seen at [facility] (06/16/19) for large volume urine leakage that was thought to be due to E coli UTI (tx'd with Doxy) or related to the 06/10/19 spinal injection.  [facility] ov with Nicole Perez 07/16/19: the patient related the above story though I did not record complaint of fatigue at that time. Labs this visit CBC/BMET/TSH were unremarkable.   [facility] ov with Nicole Perez 07/23/19: Chief complaint was fatigue that pt related to a course of prednisone she had received two weeks prior.  Going back, there is no mention of prednisone course in [facility] record until the 06/10/19 date of spinal injection.  Given the patient's report her prednisone course had been just 2 weeks previous, the possibility of exogenously-produced adrenal insufficiency was considered and  A 19-day taper prednisone taper was prescribed, starting at 40 mg  prednisone.     Ms Pinch reports feeling worse on this prednisone taper.    [name] reports being seen at [facility] on Friday, 8/21, where she complained of her fatigues.  She reports that she was told that the prednisone "was bad for her." She says she was told to stop it and drink lots of water.   Today, [name] says she feels a little better.   Associated symptoms of soaking night sweats, butut no fever/chills. Lightheaded with standing sometimes, but normal BP at [facility] on 8/21. No other new medications other than prednisone two courses and the spinal injection ~6/24.  Denies loss of appetite.  Denies weight loss.    Able to perform her ADLs  ROS: see HPI  Pertinent PMHx:  Chronic Opiate therapy - PDMP review shows last Norco 10/325 #120 tab was on 07/09/19, 30 day supply.   Exam:  Respiratory: speaking in full sentence, no audible wheeze  Assessment/Plan:  Fatigue Ongoing new problem without clear causation. While may be spontaneously resolving given patient's description of feeling better today, will need to follow this up.  Requested [name] come into [facility] lab at [place] hrs on 08/17/19 for a morning serum cortisol.  This should help with this question of adrenal insufficiency as source of pt's fatigue.   Will schedule [name] in my clinic 08/20/19 morning for in-person evaluation of this complaint.  Is there a role for opiate withdrawal in this week's fatigue complaint? Will request urine opiate assay on 08/17/19 as well.     COVID-19 Education: The signs and  symptoms of COVID-19 were discussed with the patient and how to seek care for testing (follow up with PCP or arrange E-visit).  The importance of social distancing was discussed today.  Time spent on phone with patient: 20 minutes

## 2019-08-17 ENCOUNTER — Other Ambulatory Visit: Payer: Self-pay

## 2019-08-17 ENCOUNTER — Other Ambulatory Visit: Payer: Medicare Other

## 2019-08-17 DIAGNOSIS — R5383 Other fatigue: Secondary | ICD-10-CM

## 2019-08-17 DIAGNOSIS — K31819 Angiodysplasia of stomach and duodenum without bleeding: Secondary | ICD-10-CM

## 2019-08-17 DIAGNOSIS — D500 Iron deficiency anemia secondary to blood loss (chronic): Secondary | ICD-10-CM

## 2019-08-17 DIAGNOSIS — Z79899 Other long term (current) drug therapy: Secondary | ICD-10-CM

## 2019-08-17 DIAGNOSIS — G894 Chronic pain syndrome: Secondary | ICD-10-CM

## 2019-08-17 DIAGNOSIS — F1190 Opioid use, unspecified, uncomplicated: Secondary | ICD-10-CM

## 2019-08-17 DIAGNOSIS — Z79891 Long term (current) use of opiate analgesic: Secondary | ICD-10-CM

## 2019-08-18 ENCOUNTER — Telehealth: Payer: Self-pay | Admitting: Family Medicine

## 2019-08-18 LAB — CBC
Hematocrit: 32.7 % — ABNORMAL LOW (ref 34.0–46.6)
Hemoglobin: 10.5 g/dL — ABNORMAL LOW (ref 11.1–15.9)
MCH: 28.2 pg (ref 26.6–33.0)
MCHC: 32.1 g/dL (ref 31.5–35.7)
MCV: 88 fL (ref 79–97)
Platelets: 224 10*3/uL (ref 150–450)
RBC: 3.73 x10E6/uL — ABNORMAL LOW (ref 3.77–5.28)
RDW: 13.1 % (ref 11.7–15.4)
WBC: 4.8 10*3/uL (ref 3.4–10.8)

## 2019-08-18 LAB — CMP14+EGFR
ALT: 8 IU/L (ref 0–32)
AST: 15 IU/L (ref 0–40)
Albumin/Globulin Ratio: 1.3 (ref 1.2–2.2)
Albumin: 3.8 g/dL (ref 3.6–4.6)
Alkaline Phosphatase: 48 IU/L (ref 39–117)
BUN/Creatinine Ratio: 27 (ref 12–28)
BUN: 35 mg/dL — ABNORMAL HIGH (ref 8–27)
Bilirubin Total: 0.2 mg/dL (ref 0.0–1.2)
CO2: 24 mmol/L (ref 20–29)
Calcium: 9.3 mg/dL (ref 8.7–10.3)
Chloride: 101 mmol/L (ref 96–106)
Creatinine, Ser: 1.31 mg/dL — ABNORMAL HIGH (ref 0.57–1.00)
GFR calc Af Amer: 43 mL/min/{1.73_m2} — ABNORMAL LOW (ref >59)
GFR calc non Af Amer: 37 mL/min/{1.73_m2} — ABNORMAL LOW (ref >59)
Globulin, Total: 2.9 g/dL (ref 1.5–4.5)
Glucose: 99 mg/dL (ref 65–99)
Potassium: 4 mmol/L (ref 3.5–5.2)
Sodium: 140 mmol/L (ref 134–144)
Total Protein: 6.7 g/dL (ref 6.0–8.5)

## 2019-08-18 LAB — CORTISOL: Cortisol: 8.6 ug/dL

## 2019-08-18 NOTE — Telephone Encounter (Signed)
ERROR

## 2019-08-20 ENCOUNTER — Ambulatory Visit (INDEPENDENT_AMBULATORY_CARE_PROVIDER_SITE_OTHER): Payer: Medicare Other | Admitting: Family Medicine

## 2019-08-20 ENCOUNTER — Encounter: Payer: Self-pay | Admitting: Family Medicine

## 2019-08-20 ENCOUNTER — Other Ambulatory Visit: Payer: Self-pay

## 2019-08-20 VITALS — BP 144/74 | HR 94 | Wt 161.0 lb

## 2019-08-20 DIAGNOSIS — N390 Urinary tract infection, site not specified: Secondary | ICD-10-CM

## 2019-08-20 DIAGNOSIS — R634 Abnormal weight loss: Secondary | ICD-10-CM | POA: Diagnosis not present

## 2019-08-20 DIAGNOSIS — G8929 Other chronic pain: Secondary | ICD-10-CM | POA: Diagnosis not present

## 2019-08-20 DIAGNOSIS — R5383 Other fatigue: Secondary | ICD-10-CM | POA: Diagnosis not present

## 2019-08-20 DIAGNOSIS — M5441 Lumbago with sciatica, right side: Secondary | ICD-10-CM

## 2019-08-20 DIAGNOSIS — Z7409 Other reduced mobility: Secondary | ICD-10-CM

## 2019-08-20 DIAGNOSIS — R300 Dysuria: Secondary | ICD-10-CM | POA: Diagnosis not present

## 2019-08-20 DIAGNOSIS — G894 Chronic pain syndrome: Secondary | ICD-10-CM

## 2019-08-20 DIAGNOSIS — N3020 Other chronic cystitis without hematuria: Secondary | ICD-10-CM

## 2019-08-20 LAB — POCT URINALYSIS DIP (MANUAL ENTRY)
Bilirubin, UA: NEGATIVE
Blood, UA: NEGATIVE
Glucose, UA: NEGATIVE mg/dL
Ketones, POC UA: NEGATIVE mg/dL
Nitrite, UA: POSITIVE — AB
Protein Ur, POC: NEGATIVE mg/dL
Spec Grav, UA: 1.015 (ref 1.010–1.025)
Urobilinogen, UA: 0.2 E.U./dL
pH, UA: 7 (ref 5.0–8.0)

## 2019-08-20 NOTE — Progress Notes (Signed)
Nicole Perez is accompanied by daughter, Nicole Perez Sources of clinical information for visit is/are patient, relative(s) and past medical records. Nursing assessment for this office visit was reviewed with the patient for accuracy and revision.   Previous Report(s) Reviewed: historical medical records  Depression screen [facility] 2/9 07/23/2019  Decreased Interest 0  Down, Depressed, Hopeless 0  PHQ - 2 Score 0  Some recent data might be hidden   Fall Risk  07/23/2019 07/09/2019 02/12/2019 02/03/2019 11/07/2018  Falls in the past year? 0 0 1 0 0  Number falls in past yr: 0 - 0 - -  Injury with Fall? - - 0 - -  Risk Factor Category  - - - - -  Risk for fall due to : - - - - -  Risk for fall due to: Comment - - - - -  Follow up Falls evaluation completed - - - -   Adult vaccines due  Topic Date Due  . TETANUS/TDAP  07/10/2020   Health Maintenance Due  Topic Date Due  . INFLUENZA VACCINE  07/18/2019    History/P.E. limitations:HOH  Adult vaccines due  Topic Date Due  . TETANUS/TDAP  07/10/2020   There are no preventive care reminders to display for this patient.  Health Maintenance Due  Topic Date Due  . INFLUENZA VACCINE  07/18/2019     Chief Complaint  Patient presents with  . Fatigue  HPI: FATIGUE  Onset: Patient believes onset of fatigue was after she received a Prednisone dose pack around the time of some type of spinal therapeutic injection on or around 06/10/19 for chronic back pain.     She was seen at [facility] (06/16/19) for large volume urine leakage that was thought to be due to E coli UTI (tx'd with Doxy) or related to the 06/10/19 spinal injection.  [facility] ov with Nicole Perez 07/16/19: the patient related the above story though I did not record complaint of fatigue at that time. Labs this visit CBC/BMET/TSH were unremarkable.   [facility] ov with Nicole Perez 07/23/19: Chief complaint was fatigue that pt related to a course of prednisone she had received two weeks prior.   Going back, there is no mention of prednisone course in [facility] record until the 06/10/19 date of spinal injection.  Given the patient's report her prednisone course had been just 2 weeks previous, the possibility of exogenously-produced adrenal insufficiency was considered and  A 19-day taper prednisone taper was prescribed, starting at 40 mg prednisone.     Nicole Perez reports feeling worse on this prednisone taper.  She denies running out of her Norco prescription.  She brought Norco bottle with her to visit.  It contains about 10 tablets.     Nicole Perez reports being seen at [facility] on Friday, 8/21, where she complained of her fatigues.  She reports that she was told that the prednisone "was bad for her." She says she was told to stop it and drink lots of water.   Today, Nicole Perez says she feels a little better.   Associated symptoms of soaking night sweats, butut no fever/chills. Lightheaded with standing sometimes, but normal BP at [facility] on 8/21. No other new medications other than prednisone two courses and the spinal injection ~6/24.  Denies loss of appetite.  Denies weight loss.    Able to perform her ADLs SH: No smoking  ROS: see HPI  Pertinent PMHx:  Chronic Opiate therapy - PDMP review shows last Norco 10/325 #  120 tab was on 07/09/19, 30 day supply.   Exam:  Physical Exam VS: revieved Wt Readings from Last 3 Encounters:  08/20/19 161 lb (73 kg)  07/23/19 161 lb 6.4 oz (73.2 kg)  02/26/19 159 lb (72.1 kg)    GEN: alert, cooperative and no distress HEENT: Head: Normocephalic, no lesions, without obvious abnormality. Eye: Normal external eye, conjunctiva, lids cornea, PERL. Ears: Wearing biaural hearing aids Normal auditory canals and external ears. Non-tender. Neck / Thyroid: Supple, no masses, nodes, nodules or enlargement. COR: Heart sounds are normal.  Regular rate and rhythm without murmur, gallop or rub. LUNG: clear to auscultation bilaterally ABD:  normal appearance, normal bowel sounds, nontender to palpation,, no palpable masses, No HSM EXT: trace bilateral ankle edema ; no deformities, gait was normal for age, using one-point cane NEURO: alert, oriented, normal speech, no focal findings or movement disorder noted PSYCH: euthymic, normal speech/language, HOH wearing, concrete thoughts, good insight into her condition and recent medical history, and recent general news  Labs: Unremarkable morning cortisol, CBC, CMET, TSH  Urinalysis: small LE, (+) nitrite,  Urine opiate screen: opiate present Urine Culture: E coli > 10^5 cfu/mL.  E coli  Amoxicillin/Clavulanic Acid  R  Ampicillin           I  Cefepime            S  Ceftriaxone          S  Cefuroxime           R  Ciprofloxacin         R  Ertapenem           S  Gentamicin           S  Imipenem            S  Levofloxacin          R  Meropenem           S  Nitrofurantoin         R  Piperacillin/Tazobactam    S  Tetracycline          R  Tobramycin           S  Trimethoprim/Sulfa       S   Assessment Visit Problem List with A/P  No problem-specific Assessment & Plan notes found for this encounter.

## 2019-08-20 NOTE — Patient Instructions (Signed)
We will request Physical Therapy to come out to your house to work on your strength and endurance.   We are checking your urine for infection to see if it could be making you tired.   Dr Talissa Apple would like to see you in three weeks.

## 2019-08-21 ENCOUNTER — Telehealth: Payer: Self-pay | Admitting: Family Medicine

## 2019-08-21 DIAGNOSIS — N3000 Acute cystitis without hematuria: Secondary | ICD-10-CM

## 2019-08-21 DIAGNOSIS — N3020 Other chronic cystitis without hematuria: Secondary | ICD-10-CM

## 2019-08-21 LAB — OPIATE SCREEN, URINE: Opiate Scrn, Ur: POSITIVE ng/mL — AB

## 2019-08-21 MED ORDER — DOXYCYCLINE HYCLATE 100 MG PO TABS: 100.0000 mg | ORAL_TABLET | Freq: Two times a day (BID) | ORAL | 0 refills | Status: AC

## 2019-08-21 NOTE — Telephone Encounter (Signed)
I spoke with Nicole Perez about her mother's abnormal urinalysis that seemed like it could indicate cystitis.   Rx'd empiric Doxycycline given her recent response to this abx when prescribed recently by [facility].   Await Urine culture results with sensitivities.

## 2019-08-21 NOTE — Telephone Encounter (Signed)
Nicole Perez calls nurse line stating she just received a missed call from PCP. I did not see any notes, will forward to provider. You can call Nicole Perez at [phone].

## 2019-08-24 LAB — URINE CULTURE

## 2019-08-25 ENCOUNTER — Telehealth: Payer: Self-pay | Admitting: Family Medicine

## 2019-08-25 ENCOUNTER — Encounter: Payer: Self-pay | Admitting: Family Medicine

## 2019-08-25 DIAGNOSIS — M170 Bilateral primary osteoarthritis of knee: Secondary | ICD-10-CM

## 2019-08-25 DIAGNOSIS — M160 Bilateral primary osteoarthritis of hip: Secondary | ICD-10-CM

## 2019-08-25 DIAGNOSIS — G629 Polyneuropathy, unspecified: Secondary | ICD-10-CM

## 2019-08-25 DIAGNOSIS — M4804 Spinal stenosis, thoracic region: Secondary | ICD-10-CM

## 2019-08-25 DIAGNOSIS — G5731 Lesion of lateral popliteal nerve, right lower limb: Secondary | ICD-10-CM

## 2019-08-25 DIAGNOSIS — M159 Polyosteoarthritis, unspecified: Secondary | ICD-10-CM

## 2019-08-25 DIAGNOSIS — G959 Disease of spinal cord, unspecified: Secondary | ICD-10-CM

## 2019-08-25 DIAGNOSIS — M47812 Spondylosis without myelopathy or radiculopathy, cervical region: Secondary | ICD-10-CM

## 2019-08-25 DIAGNOSIS — M48062 Spinal stenosis, lumbar region with neurogenic claudication: Secondary | ICD-10-CM

## 2019-08-25 DIAGNOSIS — G8929 Other chronic pain: Secondary | ICD-10-CM

## 2019-08-25 MED ORDER — HYDROCODONE-ACETAMINOPHEN 10-325 MG PO TABS: 1.0000 | ORAL_TABLET | Freq: Four times a day (QID) | ORAL | 0 refills | Status: DC | PRN

## 2019-08-25 NOTE — Assessment & Plan Note (Signed)
Ongoing subacute problem Onset about 2 months ago. Non-progressive. Decreased daily energy expenditure, decreased walking, decreasing walking, and genralized feeling of weakness.  No focal cause domain fatigue on screening physical and labs.   Will treat possible UTI, but do not suspect, if it is a contributing cause, that it is the only causal factor to her fatigue.   Referral made for physical therapy [facility]) for assessment and treatment endurance and strength.   Marland Kitchen

## 2019-08-25 NOTE — Telephone Encounter (Signed)
I informed Ms Liuzzo that the urine culture form 08/20/19 grew E. Coli that was resistant to her Doxycycline she is currently taking for a presumed acute exac of her chronic cystitis with dysuria when she has urinary incontinence.   She is happy that she is tolerating the Doxycycline, as this has not been the case with several antibiotics in the past.  She says her fatigue is a little less since starting the Doxy last week.    In June, [facility] successfully treated Ms Amedeo Plenty incontinence-assoicated dysuria with oral Dosycycline despite the E coli uropathogen being resistant to Doxy in vitro.   A/      Working cause of patient's incontinence dysuria is acute E coli exacerbation of patient's chronic cystitis - dysuria persisting      E. Coli in vitro resistance to Tetracycline.       Fatigue and weight loss - possible slight improvement  P/    Continue Doxycycline 100 mg BID for completion of 10 day course - perhaps urinary doxycycline effective in vivo when not in vitro.     Ms Windon to notify office if dysuria does not imporve or if her condition worsens.

## 2019-08-25 NOTE — Assessment & Plan Note (Signed)
(+)   Incontinence dysuria, recurrent problem Prior response to Doxy in June when tx'd by [facility] Rx Doxy 100 mg BID 10 days.   See phone note 08/25/19 for reason continuing doxy despite in vitro resistance.

## 2019-08-26 ENCOUNTER — Telehealth: Payer: Self-pay | Admitting: Licensed Clinical Social Worker

## 2019-08-26 NOTE — Telephone Encounter (Signed)
   Unsuccessful Phone Outreach Note  08/26/2019 Name: Nicole Perez MRN: [id number] DOB: May 15, 1933  Referred by: McDiarmid, Blane Ohara, MD,  Reason for referral : Care Coordination ;.  Nicole Perez is a 85 y.o. year old female who sees McDiarmid, Blane Ohara, MD for primary care.  LCSW received referral to contact the daughter of Ms Nicole Perez, to discuss future options for caring for her mother in the community?  .  Called patient's daughte to assess needs and barriers reference the above referral. Telephone outreach was unsuccessful. A HIPPA compliant phone message was left for the patient providing contact information and requesting a return call.  Follow Up Plan:  If no return call is received. LCSW will call again in 3 days.  Casimer Lanius, LCSW Clinical Social Worker [place] / [place]   [phone] 1:52 PM

## 2019-08-27 NOTE — Telephone Encounter (Addendum)
Care Coordination Phone outreach Note Social Work   08/27/2019 Name: JACY SALYARDS MRN: [id number] DOB: 1933/09/01  TIFFANYANN CANIPE is a 85 y.o. year old female who sees McDiarmid, Blane Ohara, MD for primary care. LCSW was consulted for assistance with Community Resources to support patient while living at home. Received return phone call from patient's daughter Quincy Sheehan. Assessment : Patient lives alone, uses a walker and has an unsteady gait, is often in a lot of pain and does not want to cook meals. Daughter support patient but all work during the day. Family is not interested in placement the goal is to keep patient home.  Interventions: Review of difference between Medicaid and Medicare and services covered by each, discussed the following and provided information ( PACE program, CAP program at [place], [place] if patient has Medicaid, In Home Aide program at [place] and private pay options for custodial care needs. Plan:  1. Daughter will explore all options discussed 2. She will call LCSW if she has questions 3.   No additional LCSW services needed at this tim Dr. McDiarmid has been informed of this outreach and plan.  Casimer Lanius, LCSW Clinical Social Worker [place] / [place]   [phone] 12:14 PM

## 2019-08-31 DIAGNOSIS — R339 Retention of urine, unspecified: Secondary | ICD-10-CM | POA: Diagnosis not present

## 2019-09-07 ENCOUNTER — Other Ambulatory Visit: Payer: Self-pay | Admitting: Family Medicine

## 2019-09-07 ENCOUNTER — Telehealth (INDEPENDENT_AMBULATORY_CARE_PROVIDER_SITE_OTHER): Payer: Medicare Other | Admitting: *Deleted

## 2019-09-07 DIAGNOSIS — J45901 Unspecified asthma with (acute) exacerbation: Secondary | ICD-10-CM | POA: Diagnosis not present

## 2019-09-07 DIAGNOSIS — J22 Unspecified acute lower respiratory infection: Secondary | ICD-10-CM

## 2019-09-07 DIAGNOSIS — N3000 Acute cystitis without hematuria: Secondary | ICD-10-CM

## 2019-09-07 DIAGNOSIS — N3020 Other chronic cystitis without hematuria: Secondary | ICD-10-CM

## 2019-09-07 MED ORDER — DOXYCYCLINE HYCLATE 100 MG PO TABS: 100.0000 mg | ORAL_TABLET | Freq: Two times a day (BID) | ORAL | 0 refills | Status: AC

## 2019-09-07 MED ORDER — ALBUTEROL SULFATE HFA 108 (90 BASE) MCG/ACT IN AERS: 2.0000 | INHALATION_SPRAY | RESPIRATORY_TRACT | 0 refills | Status: DC | PRN

## 2019-09-07 MED ORDER — BENZONATATE 100 MG PO CAPS: 100.0000 mg | ORAL_CAPSULE | Freq: Three times a day (TID) | ORAL | 0 refills | Status: DC | PRN

## 2019-09-07 NOTE — Telephone Encounter (Signed)
[place] / E- Visit   This visit type was conducted due to national recommendations for restrictions regarding the COVID-19 Pandemic (e.g. social distancing) in an effort to limit this patient's exposure and mitigate transmission in our community.   Due to their co-morbid illnesses, this patient is at least at moderate risk for complications without adequate follow up: yes   This format is felt to be most appropriate for this patient at this time.  All issues noted in this document were discussed and addressed.  A limited physical exam was performed with this format.   Encounter participants: Patient: Nicole Perez  Patient Location: Home Provider: Sherren Mocha Christinea Brizuela at office  Others (if applicable): [name] (dgt)      Time spent on phone with patient: 12 minutes  Covid-qualifying symptoms - Onset: abrupt and 3 days ago - Course of symptoms: gradually improved-  - Fever: absent - Rigors: episodes of shaking on 3 days ago, has not recurred - Soaking sweats: no - Cough:  productive of yellow sputum    - Shortness of Breath: felt SOB 3 days ago, gradually improved - What does breathlessness Keep you from doing? Had trouble walking up ramp at home  - Comorbidities: CKD-III, recurrent uti with neurogenic bladder - Immunostatus: immunocompetent - Flu vaccination (+) but recent  CoVid19 Risk factors Travel to [place] high risk areas in last 14 days from date of symptom onset: no  Exposure to laboratory confirmed [place] patient in last 14 days: no  Exposure to a Person Under Investigation (PUI) in last 3 days: no  Known exposure to any person, including health care worker, who has had close contact with a laboratory-confirmed CoVid19 patient within last 14 days: no  Occupation: retired  Flu symptoms (Acute onset symptoms, Fever, Cough, malaise, absence nasal congestion, absence sneezing) Presence in Community:  no or Sick Contact:   no Headache: no Muscle aches: no Severe fatigue: no  UPPER RESPIRATORY INFECTION nasal congestion Sick contacts: no   Sinusitis symptoms nasal congestion Double sickening: no    Allergy symptoms: nasal congestion  PMH Eczema: no  Itchy scratchy throat: no  Seasonal sxs: no   Symptoms Recent head cold or chest cold:no    Sputum:yes  Fever: no  Shortness of breath:no  Leg Swelling:no  Heart Burn or Reflux:no  Wheezing:yes  Rhinorrhea: no     PMH Asthma or COPD: no  PMH of Smoking: former smoker, distant  Using ACEIs: no     SH:  Social History   Tobacco Use  Smoking Status Former Smoker  . Packs/day: 0.25  . Years: 20.00  . Pack years: 5.00  . Quit date: 01/14/1984  . Years since quitting: 35.6  Smokeless Tobacco Former User    ROS: See HPI  PHYSICAL EXAM Respiratory: speaking in full sentence, no audible wheeze, voice prosodic  Assessment/Plan   Lower Respiratory Tract Infection Possible Asthmatic Bronchitis/Pneumonia  - Low risk exposure to Covid other than a PCP ov 4 days ago.  Does not match known incubation period for COVID19.  Plan - Doxyclycline 100 mg BID for 7 days - Albuterol MDI q4h prn cough shob - Tessalon Pearls 100mg  if not too expensive.  OTC alternative Delsym DM - Call office if condition worsens or fails to imprioe after 2 days.       - COVID-19 Education: The signs and symptoms of COVID-19 were discussed with the patient and how to seek care for testing (follow up with PCP  or arrange E-visit).  The importance of social distancing was discussed today.

## 2019-09-07 NOTE — Telephone Encounter (Signed)
[name] calls because mom is coughing and wheezing, wants to take to urgent care.  Spoke with Dr. McDiarmid. He will given them a call.  [name] informed.  Christen Bame, CMA

## 2019-09-07 NOTE — Telephone Encounter (Signed)
Daughter states that pt has an infection again. Christen Bame, CMA

## 2019-09-08 DIAGNOSIS — H905 Unspecified sensorineural hearing loss: Secondary | ICD-10-CM | POA: Diagnosis not present

## 2019-09-10 ENCOUNTER — Ambulatory Visit: Payer: Medicare Other | Admitting: Family Medicine

## 2019-09-16 DIAGNOSIS — N3020 Other chronic cystitis without hematuria: Secondary | ICD-10-CM | POA: Diagnosis not present

## 2019-09-23 ENCOUNTER — Telehealth: Payer: Self-pay | Admitting: *Deleted

## 2019-09-23 DIAGNOSIS — N390 Urinary tract infection, site not specified: Secondary | ICD-10-CM

## 2019-09-23 MED ORDER — DOXYCYCLINE HYCLATE 100 MG PO TABS: 100.0000 mg | ORAL_TABLET | Freq: Two times a day (BID) | ORAL | 2 refills | Status: DC

## 2019-09-23 NOTE — Telephone Encounter (Signed)
Attempted to call patient, voicemail was full.  Carlyne Keehan,CMA

## 2019-09-23 NOTE — Telephone Encounter (Signed)
Pt is requesting a refill on her abx because she is having burning when she urinates again.   To PCP.  Christen Bame, CMA

## 2019-09-23 NOTE — Telephone Encounter (Signed)
Presumptive treatment of recurrent UTI based on pt's report of symptoms, self-catheterization for neurogenic bladder, history of recurrent UTIs. Pt intolerant of multiple medications.  She has tolerated doxy before and suprisingly, her UTI-like symptoms have resolved with use of doxy twice in recent past  Please let [name] know that doxycycline was sent into her phamacy.  Advise her that should she not show improvement in her symptoms within 72 hours, she should be evaluated at [facility].  Should her condition worsen, she should either contact the [facility] or go to ED.

## 2019-09-23 NOTE — Telephone Encounter (Signed)
Patient and daughter informed and will pick up script from pharmacy. Franck Vinal,CMA

## 2019-10-29 ENCOUNTER — Ambulatory Visit (INDEPENDENT_AMBULATORY_CARE_PROVIDER_SITE_OTHER): Payer: Medicare Other | Admitting: Family Medicine

## 2019-10-29 ENCOUNTER — Other Ambulatory Visit: Payer: Self-pay

## 2019-10-29 VITALS — BP 130/80 | HR 77 | Ht 59.0 in | Wt 160.2 lb

## 2019-10-29 DIAGNOSIS — M47812 Spondylosis without myelopathy or radiculopathy, cervical region: Secondary | ICD-10-CM

## 2019-10-29 DIAGNOSIS — M4804 Spinal stenosis, thoracic region: Secondary | ICD-10-CM

## 2019-10-29 DIAGNOSIS — G959 Disease of spinal cord, unspecified: Secondary | ICD-10-CM

## 2019-10-29 DIAGNOSIS — G5731 Lesion of lateral popliteal nerve, right lower limb: Secondary | ICD-10-CM | POA: Diagnosis not present

## 2019-10-29 DIAGNOSIS — G629 Polyneuropathy, unspecified: Secondary | ICD-10-CM

## 2019-10-29 DIAGNOSIS — M160 Bilateral primary osteoarthritis of hip: Secondary | ICD-10-CM

## 2019-10-29 DIAGNOSIS — M5441 Lumbago with sciatica, right side: Secondary | ICD-10-CM

## 2019-10-29 DIAGNOSIS — R300 Dysuria: Secondary | ICD-10-CM | POA: Diagnosis not present

## 2019-10-29 DIAGNOSIS — M159 Polyosteoarthritis, unspecified: Secondary | ICD-10-CM

## 2019-10-29 DIAGNOSIS — Z23 Encounter for immunization: Secondary | ICD-10-CM | POA: Diagnosis not present

## 2019-10-29 DIAGNOSIS — M8949 Other hypertrophic osteoarthropathy, multiple sites: Secondary | ICD-10-CM

## 2019-10-29 DIAGNOSIS — G8929 Other chronic pain: Secondary | ICD-10-CM

## 2019-10-29 DIAGNOSIS — N319 Neuromuscular dysfunction of bladder, unspecified: Secondary | ICD-10-CM

## 2019-10-29 DIAGNOSIS — M170 Bilateral primary osteoarthritis of knee: Secondary | ICD-10-CM

## 2019-10-29 DIAGNOSIS — M48062 Spinal stenosis, lumbar region with neurogenic claudication: Secondary | ICD-10-CM

## 2019-10-29 LAB — POCT URINALYSIS DIP (MANUAL ENTRY)
Bilirubin, UA: NEGATIVE
Glucose, UA: NEGATIVE mg/dL
Ketones, POC UA: NEGATIVE mg/dL
Leukocytes, UA: NEGATIVE
Nitrite, UA: POSITIVE — AB
Protein Ur, POC: NEGATIVE mg/dL
Spec Grav, UA: 1.025 (ref 1.010–1.025)
Urobilinogen, UA: 0.2 E.U./dL
pH, UA: 5.5 (ref 5.0–8.0)

## 2019-10-29 LAB — POCT UA - MICROSCOPIC ONLY

## 2019-10-29 MED ORDER — DOXYCYCLINE HYCLATE 100 MG PO CAPS: 100.0000 mg | ORAL_CAPSULE | Freq: Two times a day (BID) | ORAL | 0 refills | Status: AC

## 2019-10-29 MED ORDER — HYDROCODONE-ACETAMINOPHEN 10-325 MG PO TABS: 1.0000 | ORAL_TABLET | Freq: Four times a day (QID) | ORAL | 0 refills | Status: DC | PRN

## 2019-10-29 NOTE — Patient Instructions (Signed)
Thank you for coming to see me today. It was a pleasure meeting you Ms. Haw! Today we talked about:   I have sent in another refill of that doxycycline for the burning that you are having when you pee.  Please be sure to take all of the antibiotic.  I have also refilled your pain medication after speaking with Dr. Wendy Poet.  Please follow-up as needed.  If you have any questions or concerns, please do not hesitate to call the office at [phone].  Take Care,   [place] Alahia Whicker, DO

## 2019-10-29 NOTE — Progress Notes (Signed)
Subjective:  Patient ID: Nicole Perez  DOB: July 03, 1933 MRN: [id number]  Nicole Perez is a 85 y.o. female with a PMH of HTN, neuropathy, OA, glaucoma, bladder neurogenous (self catheterizes), here today for dysuria.   HPI:  Dysuria: -Patient currently on doxycycline given to her by Dr. Arther Dames for her presumed acute exacerbation of her chronic cystitis with dysuria.  Patient states that she has had a problem with this for a long time.  She reports that the doxycycline tends to help her but she is running out.  She reports that she is not having any trouble with fatigue at this time.  She denies any fevers, chills.  She is followed by [facility] and closely with Dr. McDiarmid here in the office.  She reports that she self catheterizes and sometimes notices depending on what she eats that her urine will burn more as it is coming down.  Patient denies any abdominal pain.  She does report some bladder pressure.  Chronic pain on opioids Patient also requesting refill of her chronic opioids that she is running out and was told by Dr. McDiarmid not to run out.  ROS: As mentioned in HPI  Social hx: Denies use of illicit drugs, alcohol use Smoking status reviewed  Patient Active Problem List   Diagnosis Date Noted  . Chronic kidney disease (CKD), stage III (moderate) 08/14/2019  . Chronic, continuous use of opioids 08/14/2019  . Chronic cystitis 06/29/2019  . Chronic pain syndrome 02/27/2019  . Recurrent UTI 04/24/2018  . Encounter for long-term opiate analgesic use 05/30/2017  . 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 Sinoatrial block   . Impaired functional mobility, balance, gait, and endurance 03/25/2017  . Hemorrhoid prolapse 11/16/2016  . Iron deficiency anemia due to chronic blood loss 09/14/2016  . AVM (arteriovenous malformation) of duodenum, acquired 04/09/2016  . Dry eye syndrome 11/16/2014  . Glaucoma suspect, bilateral 11/16/2014  . Peroneal  neuropathy 09/30/2014  . Cervical spondylosis without myelopathy 08/07/2014  . At risk for falls 08/06/2014  . Memory impairment 08/06/2014  . Peripheral painful Neuropathy ([place]) 08/06/2014  . Bladder neurogenous 02/09/2014  . Osteoarthritis, multiple sites 08/11/2013  . Cystocele with prolapse 08/11/2013  . Osteoarthritis of both knees 04/22/2013  . Obesity, unspecified 04/22/2013  . Vitamin D deficiency 09/30/2012  . Fatigue 07/30/2012  . Hearing loss sensory, bilateral 07/26/2011  . Spinal stenosis of thoracic region 07/26/2011  . Spinal stenosis of lumbar region 07/26/2011  . Lichen sclerosus et atrophicus of the vulva   . Pure hypercholesterolemia 05/10/2010  . Osteoarthritis of spine with myelopathy, thoracolumbar region 04/21/2010  . Insomnia disorder, psychophysiologic type 04/18/2010  . Essential hypertension, benign 04/18/2010  . Chronic back pain 04/18/2010  . Osteoarthritis of both hips 09/11/2007     Objective:  BP 130/80   Pulse 77   Ht 4\' 11"  (1.499 m)   Wt 160 lb 4 oz (72.7 kg)   LMP 12/17/1962   SpO2 98%   BMI 32.37 kg/m   Vitals and nursing note reviewed  General: NAD, pleasant, walking in alone with walker Pulm: normal effort GI: soft, nontender, nondistended Extremities: no edema or cyanosis. WWP. Skin: warm and dry, no rashes noted Neuro: alert and oriented, no focal deficits Psych: normal affect, normal thought content  Assessment & Plan:   Bladder neurogenous Patient to continue self cathing.  Discussed with her PCP in the office and he recommended continuing longer course of doxycycline, will continue  Chronic back pain  Refilled patient's opioid prescription that is appropriately filled up per PMP aware with permission of patient's PCP.   [place] Zelig Gacek, DO Family Medicine Resident PGY-3

## 2019-10-30 NOTE — Assessment & Plan Note (Signed)
Patient to continue self cathing.  Discussed with her PCP in the office and he recommended continuing longer course of doxycycline, will continue

## 2019-10-30 NOTE — Assessment & Plan Note (Signed)
Refilled patient's opioid prescription that is appropriately filled up per PMP aware with permission of patient's PCP.

## 2019-12-15 ENCOUNTER — Other Ambulatory Visit: Payer: Self-pay | Admitting: Family Medicine

## 2019-12-15 DIAGNOSIS — R339 Retention of urine, unspecified: Secondary | ICD-10-CM | POA: Diagnosis not present

## 2019-12-15 DIAGNOSIS — N390 Urinary tract infection, site not specified: Secondary | ICD-10-CM

## 2019-12-16 NOTE — Telephone Encounter (Signed)
Pt daughter calls nurse line to check status of rx for UTI. States pt has recurrent UTIs related to self catheterizations.   Talbot Grumbling, RN

## 2019-12-16 NOTE — Telephone Encounter (Signed)
According to November office visit note patient has frequent urinary irritation infection that is treated with doxycycline.     Attempted to contact daughter but no answer  Given Covid crisis which precludes in person visit and repetitive nature of complaints will send in refill for doxycycline

## 2019-12-28 ENCOUNTER — Other Ambulatory Visit: Payer: Self-pay

## 2019-12-28 ENCOUNTER — Ambulatory Visit (INDEPENDENT_AMBULATORY_CARE_PROVIDER_SITE_OTHER): Payer: Medicare Other | Admitting: Family Medicine

## 2019-12-28 ENCOUNTER — Ambulatory Visit
Admission: RE | Admit: 2019-12-28 | Discharge: 2019-12-28 | Disposition: A | Discharge status: Home / Self Care | Payer: Medicare Other | Source: Ambulatory Visit | Attending: Family Medicine | Admitting: Family Medicine

## 2019-12-28 VITALS — BP 150/70 | HR 85 | Temp 97.9°F

## 2019-12-28 DIAGNOSIS — M25552 Pain in left hip: Secondary | ICD-10-CM | POA: Diagnosis not present

## 2019-12-28 DIAGNOSIS — S79912A Unspecified injury of left hip, initial encounter: Secondary | ICD-10-CM | POA: Diagnosis not present

## 2019-12-28 NOTE — Patient Instructions (Signed)
Thank you for coming to see me today. It was a pleasure. Today we talked about:   Your left hip pain: I have placed an order for X-rays.  Please go to [place] at [facility] or at [facility] to have this completed.  You do not need an appointment, but if you would like to call them beforehand, their number is [phone].  We will contact you with your results afterwards.  Continue to use your pain medication.  Ice the area.  You can use over the counter creams for pain including capsicin cream or voltaren gel on the area.  We will consider referral to PT after the x-rays.  If you have any questions or concerns, please do not hesitate to call the office at [phone].  Best,   [place] Constable, DO

## 2019-12-28 NOTE — Progress Notes (Signed)
Subjective: Chief Complaint  Patient presents with  . Hip Pain     HPI: Nicole Perez is a 86 y.o. presenting to clinic today to discuss the following:  1 Left Hip Pain 2 days ago, patient was climbing into bed, when she felt a pop in her left hip.  States that she climbs into bed this way every day.  Had excruciating pain at the time of the pop.  Has not noticed any redness or swelling.  Has had pain that radiates from her left hip down her left leg all the way to her feet.  This comes and goes.  Worsens when she tries to bear weight.  Normally uses a walker to walk, but has been having difficulty with this secondary to pain.  Rates the pain 9/10 at worst when she is trying to bear weight, 6/10 at present, sitting in wheelchair.  Takes hydrocodone/acetaminophen for chronic back pain, has been taking this for her pain as well.  Also using ice and heat.  Is concerned that she may have broken her hip.       ROS noted in HPI. Chief complaint noted.  Other Pertinent PMH: Hypertension, SA block, peripheral neuropathy, peroneal neuropathy, osteoarthritis of spine Past Medical, Surgical, Social, and Family History Reviewed & Updated per EMR.      Social History   Tobacco Use  Smoking Status Former Smoker  . Packs/day: 0.25  . Years: 20.00  . Pack years: 5.00  . Quit date: 01/14/1984  . Years since quitting: 35.9  Smokeless Tobacco Former User   Smoking status noted.    Objective: BP (!) 150/70   Pulse 85   Temp 97.9 F (36.6 C) (Oral)   LMP 12/17/1962   SpO2 96%  Vitals and nursing notes reviewed  Physical Exam:  General: 86 y.o. female in NAD, sitting in wheelchair Lungs: Breathing comfortably on room air Skin: warm and dry Left Hip:  - Inspection: No gross deformity, no swelling, erythema, or ecchymosis bilaterally - Palpation: TTP left hip at greater trochanter and ASIS, no TTP right hip, no TTP along length of femur - ROM: Exam limited 2/2 pain, flexion WNL,  extension mildly decreased, pain with internal and external rotation on left, full ROM on right - Strength: Normal strength b/l - Neuro/vasc: NV intact distally - Special Tests: Positive FABER and FADIR on left, negative on right.   - no TTP b/l knees, full ROM b/l knees  No results found for this or any previous visit (from the past 68 hour(s)).  Assessment/Plan:  Left hip pain Concern for fracture given pain with weightbearing and patient's age.  Will perform left hip x-rays today.  Discussed with patient and her daughter possibility of physical therapy in the future, daughter would like to wait until we get results of x-ray.  Also discussed with daughter that if x-rays are negative, it is likely that MRI would not add to the picture, this would be a soft tissue injury, patient would likely not be a candidate for surgery given her age and comorbidities.  Her daughter voiced understanding of this.  For now, we will get x-rays, continue with icing, continue with slight range of motion exercises that do not cause pain, can use topical Voltaren gel or capsaicin cream.  We will continue with patient's home Norco.  Advised to follow-up if worsening or no improvement.     PATIENT EDUCATION PROVIDED: See AVS    Diagnosis and plan along with any  newly prescribed medication(s) were discussed in detail with this patient today. The patient verbalized understanding and agreed with the plan. Patient advised if symptoms worsen return to clinic or ER.   Health Maintainance:   Orders Placed This Encounter  Procedures  . DG Hip Unilat W OR W/O Pelvis 2-3 Views Left    Standing Status:   Future    Standing Expiration Date:   02/24/2021    Order Specific Question:   Reason for Exam (SYMPTOM  OR DIAGNOSIS REQUIRED)    Answer:   left hip pain    Order Specific Question:   Preferred imaging location?    Answer:   [facility]    Order Specific Question:   Radiology Contrast Protocol - do NOT  remove file path    Answer:   [email or web address]    No orders of the defined types were placed in this encounter.    [place] Constable, DO 12/28/2019, 2:11 PM PGY-2 [place]

## 2019-12-28 NOTE — Assessment & Plan Note (Signed)
Concern for fracture given pain with weightbearing and patient's age.  Will perform left hip x-rays today.  Discussed with patient and her daughter possibility of physical therapy in the future, daughter would like to wait until we get results of x-ray.  Also discussed with daughter that if x-rays are negative, it is likely that MRI would not add to the picture, this would be a soft tissue injury, patient would likely not be a candidate for surgery given her age and comorbidities.  Her daughter voiced understanding of this.  For now, we will get x-rays, continue with icing, continue with slight range of motion exercises that do not cause pain, can use topical Voltaren gel or capsaicin cream.  We will continue with patient's home Norco.  Advised to follow-up if worsening or no improvement.

## 2019-12-29 ENCOUNTER — Other Ambulatory Visit: Payer: Self-pay | Admitting: Family Medicine

## 2019-12-29 DIAGNOSIS — M25552 Pain in left hip: Secondary | ICD-10-CM

## 2019-12-29 NOTE — Progress Notes (Signed)
Ref placed to HHPT for patient given left hip pain and no fracture on XR

## 2020-01-01 ENCOUNTER — Telehealth: Payer: Self-pay

## 2020-01-01 DIAGNOSIS — M25552 Pain in left hip: Secondary | ICD-10-CM | POA: Diagnosis not present

## 2020-01-01 DIAGNOSIS — M48061 Spinal stenosis, lumbar region without neurogenic claudication: Secondary | ICD-10-CM | POA: Diagnosis not present

## 2020-01-01 DIAGNOSIS — N183 Chronic kidney disease, stage 3 unspecified: Secondary | ICD-10-CM | POA: Diagnosis not present

## 2020-01-01 DIAGNOSIS — I129 Hypertensive chronic kidney disease with stage 1 through stage 4 chronic kidney disease, or unspecified chronic kidney disease: Secondary | ICD-10-CM | POA: Diagnosis not present

## 2020-01-01 DIAGNOSIS — G629 Polyneuropathy, unspecified: Secondary | ICD-10-CM | POA: Diagnosis not present

## 2020-01-01 DIAGNOSIS — Z9181 History of falling: Secondary | ICD-10-CM | POA: Diagnosis not present

## 2020-01-01 DIAGNOSIS — Z792 Long term (current) use of antibiotics: Secondary | ICD-10-CM | POA: Diagnosis not present

## 2020-01-01 DIAGNOSIS — Z87440 Personal history of urinary (tract) infections: Secondary | ICD-10-CM | POA: Diagnosis not present

## 2020-01-01 DIAGNOSIS — M1612 Unilateral primary osteoarthritis, left hip: Secondary | ICD-10-CM | POA: Diagnosis not present

## 2020-01-01 DIAGNOSIS — N3020 Other chronic cystitis without hematuria: Secondary | ICD-10-CM | POA: Diagnosis not present

## 2020-01-01 DIAGNOSIS — M170 Bilateral primary osteoarthritis of knee: Secondary | ICD-10-CM | POA: Diagnosis not present

## 2020-01-01 DIAGNOSIS — M4715 Other spondylosis with myelopathy, thoracolumbar region: Secondary | ICD-10-CM | POA: Diagnosis not present

## 2020-01-01 DIAGNOSIS — Z79891 Long term (current) use of opiate analgesic: Secondary | ICD-10-CM | POA: Diagnosis not present

## 2020-01-01 DIAGNOSIS — G894 Chronic pain syndrome: Secondary | ICD-10-CM | POA: Diagnosis not present

## 2020-01-01 DIAGNOSIS — H903 Sensorineural hearing loss, bilateral: Secondary | ICD-10-CM | POA: Diagnosis not present

## 2020-01-01 NOTE — Telephone Encounter (Signed)
Nicole Perez, home health PT, calls nurse line requesting VO for home health physical therapy as follows.  3x a week for 2 weeks 2x a week for 4 weeks  1x a week for 2 weeks  [phone], you can LVM

## 2020-01-04 DIAGNOSIS — Z792 Long term (current) use of antibiotics: Secondary | ICD-10-CM | POA: Diagnosis not present

## 2020-01-04 DIAGNOSIS — N3020 Other chronic cystitis without hematuria: Secondary | ICD-10-CM | POA: Diagnosis not present

## 2020-01-04 DIAGNOSIS — M170 Bilateral primary osteoarthritis of knee: Secondary | ICD-10-CM | POA: Diagnosis not present

## 2020-01-04 DIAGNOSIS — Z87440 Personal history of urinary (tract) infections: Secondary | ICD-10-CM | POA: Diagnosis not present

## 2020-01-04 DIAGNOSIS — M4715 Other spondylosis with myelopathy, thoracolumbar region: Secondary | ICD-10-CM | POA: Diagnosis not present

## 2020-01-04 DIAGNOSIS — M1612 Unilateral primary osteoarthritis, left hip: Secondary | ICD-10-CM | POA: Diagnosis not present

## 2020-01-04 DIAGNOSIS — Z9181 History of falling: Secondary | ICD-10-CM | POA: Diagnosis not present

## 2020-01-04 DIAGNOSIS — I129 Hypertensive chronic kidney disease with stage 1 through stage 4 chronic kidney disease, or unspecified chronic kidney disease: Secondary | ICD-10-CM | POA: Diagnosis not present

## 2020-01-04 DIAGNOSIS — M25552 Pain in left hip: Secondary | ICD-10-CM | POA: Diagnosis not present

## 2020-01-04 DIAGNOSIS — N183 Chronic kidney disease, stage 3 unspecified: Secondary | ICD-10-CM | POA: Diagnosis not present

## 2020-01-04 DIAGNOSIS — G629 Polyneuropathy, unspecified: Secondary | ICD-10-CM | POA: Diagnosis not present

## 2020-01-04 DIAGNOSIS — M48061 Spinal stenosis, lumbar region without neurogenic claudication: Secondary | ICD-10-CM | POA: Diagnosis not present

## 2020-01-04 DIAGNOSIS — G894 Chronic pain syndrome: Secondary | ICD-10-CM | POA: Diagnosis not present

## 2020-01-04 DIAGNOSIS — Z79891 Long term (current) use of opiate analgesic: Secondary | ICD-10-CM | POA: Diagnosis not present

## 2020-01-04 DIAGNOSIS — H903 Sensorineural hearing loss, bilateral: Secondary | ICD-10-CM | POA: Diagnosis not present

## 2020-01-04 NOTE — Telephone Encounter (Signed)
Please call verbal order to [place], home health PT, for home health physical therapy as follows.   3x a week for 2 weeks 2x a week for 4 weeks  1x a week for 2 weeks   [phone], you can LVM

## 2020-01-04 NOTE — Telephone Encounter (Signed)
LM for sean.  Maleko Greulich,CMA

## 2020-01-06 ENCOUNTER — Other Ambulatory Visit: Payer: Self-pay

## 2020-01-06 DIAGNOSIS — M4804 Spinal stenosis, thoracic region: Secondary | ICD-10-CM

## 2020-01-06 DIAGNOSIS — M48061 Spinal stenosis, lumbar region without neurogenic claudication: Secondary | ICD-10-CM | POA: Diagnosis not present

## 2020-01-06 DIAGNOSIS — M25552 Pain in left hip: Secondary | ICD-10-CM | POA: Diagnosis not present

## 2020-01-06 DIAGNOSIS — G5731 Lesion of lateral popliteal nerve, right lower limb: Secondary | ICD-10-CM

## 2020-01-06 DIAGNOSIS — N183 Chronic kidney disease, stage 3 unspecified: Secondary | ICD-10-CM | POA: Diagnosis not present

## 2020-01-06 DIAGNOSIS — M160 Bilateral primary osteoarthritis of hip: Secondary | ICD-10-CM

## 2020-01-06 DIAGNOSIS — M1612 Unilateral primary osteoarthritis, left hip: Secondary | ICD-10-CM | POA: Diagnosis not present

## 2020-01-06 DIAGNOSIS — M170 Bilateral primary osteoarthritis of knee: Secondary | ICD-10-CM

## 2020-01-06 DIAGNOSIS — G959 Disease of spinal cord, unspecified: Secondary | ICD-10-CM

## 2020-01-06 DIAGNOSIS — Z79891 Long term (current) use of opiate analgesic: Secondary | ICD-10-CM | POA: Diagnosis not present

## 2020-01-06 DIAGNOSIS — G8929 Other chronic pain: Secondary | ICD-10-CM

## 2020-01-06 DIAGNOSIS — Z792 Long term (current) use of antibiotics: Secondary | ICD-10-CM | POA: Diagnosis not present

## 2020-01-06 DIAGNOSIS — I129 Hypertensive chronic kidney disease with stage 1 through stage 4 chronic kidney disease, or unspecified chronic kidney disease: Secondary | ICD-10-CM | POA: Diagnosis not present

## 2020-01-06 DIAGNOSIS — G894 Chronic pain syndrome: Secondary | ICD-10-CM | POA: Diagnosis not present

## 2020-01-06 DIAGNOSIS — H903 Sensorineural hearing loss, bilateral: Secondary | ICD-10-CM | POA: Diagnosis not present

## 2020-01-06 DIAGNOSIS — Z9181 History of falling: Secondary | ICD-10-CM | POA: Diagnosis not present

## 2020-01-06 DIAGNOSIS — G629 Polyneuropathy, unspecified: Secondary | ICD-10-CM | POA: Diagnosis not present

## 2020-01-06 DIAGNOSIS — M48062 Spinal stenosis, lumbar region with neurogenic claudication: Secondary | ICD-10-CM

## 2020-01-06 DIAGNOSIS — M47812 Spondylosis without myelopathy or radiculopathy, cervical region: Secondary | ICD-10-CM

## 2020-01-06 DIAGNOSIS — Z87440 Personal history of urinary (tract) infections: Secondary | ICD-10-CM | POA: Diagnosis not present

## 2020-01-06 DIAGNOSIS — M159 Polyosteoarthritis, unspecified: Secondary | ICD-10-CM

## 2020-01-06 DIAGNOSIS — M5441 Lumbago with sciatica, right side: Secondary | ICD-10-CM

## 2020-01-06 DIAGNOSIS — M4715 Other spondylosis with myelopathy, thoracolumbar region: Secondary | ICD-10-CM | POA: Diagnosis not present

## 2020-01-06 DIAGNOSIS — N390 Urinary tract infection, site not specified: Secondary | ICD-10-CM

## 2020-01-06 DIAGNOSIS — N3020 Other chronic cystitis without hematuria: Secondary | ICD-10-CM | POA: Diagnosis not present

## 2020-01-06 MED ORDER — DOXYCYCLINE HYCLATE 100 MG PO TABS: ORAL_TABLET | ORAL | 0 refills | Status: DC

## 2020-01-06 MED ORDER — HYDROCODONE-ACETAMINOPHEN 10-325 MG PO TABS: 1.0000 | ORAL_TABLET | Freq: Four times a day (QID) | ORAL | 0 refills | Status: DC | PRN

## 2020-01-08 DIAGNOSIS — G894 Chronic pain syndrome: Secondary | ICD-10-CM | POA: Diagnosis not present

## 2020-01-08 DIAGNOSIS — I129 Hypertensive chronic kidney disease with stage 1 through stage 4 chronic kidney disease, or unspecified chronic kidney disease: Secondary | ICD-10-CM | POA: Diagnosis not present

## 2020-01-08 DIAGNOSIS — M25552 Pain in left hip: Secondary | ICD-10-CM | POA: Diagnosis not present

## 2020-01-08 DIAGNOSIS — Z792 Long term (current) use of antibiotics: Secondary | ICD-10-CM | POA: Diagnosis not present

## 2020-01-08 DIAGNOSIS — Z79891 Long term (current) use of opiate analgesic: Secondary | ICD-10-CM | POA: Diagnosis not present

## 2020-01-08 DIAGNOSIS — N183 Chronic kidney disease, stage 3 unspecified: Secondary | ICD-10-CM | POA: Diagnosis not present

## 2020-01-08 DIAGNOSIS — M170 Bilateral primary osteoarthritis of knee: Secondary | ICD-10-CM | POA: Diagnosis not present

## 2020-01-08 DIAGNOSIS — Z9181 History of falling: Secondary | ICD-10-CM | POA: Diagnosis not present

## 2020-01-08 DIAGNOSIS — Z87440 Personal history of urinary (tract) infections: Secondary | ICD-10-CM | POA: Diagnosis not present

## 2020-01-08 DIAGNOSIS — M4715 Other spondylosis with myelopathy, thoracolumbar region: Secondary | ICD-10-CM | POA: Diagnosis not present

## 2020-01-08 DIAGNOSIS — M48061 Spinal stenosis, lumbar region without neurogenic claudication: Secondary | ICD-10-CM | POA: Diagnosis not present

## 2020-01-08 DIAGNOSIS — H903 Sensorineural hearing loss, bilateral: Secondary | ICD-10-CM | POA: Diagnosis not present

## 2020-01-08 DIAGNOSIS — M1612 Unilateral primary osteoarthritis, left hip: Secondary | ICD-10-CM | POA: Diagnosis not present

## 2020-01-08 DIAGNOSIS — N3020 Other chronic cystitis without hematuria: Secondary | ICD-10-CM | POA: Diagnosis not present

## 2020-01-08 DIAGNOSIS — G629 Polyneuropathy, unspecified: Secondary | ICD-10-CM | POA: Diagnosis not present

## 2020-01-11 DIAGNOSIS — M170 Bilateral primary osteoarthritis of knee: Secondary | ICD-10-CM | POA: Diagnosis not present

## 2020-01-11 DIAGNOSIS — Z9181 History of falling: Secondary | ICD-10-CM | POA: Diagnosis not present

## 2020-01-11 DIAGNOSIS — Z87440 Personal history of urinary (tract) infections: Secondary | ICD-10-CM | POA: Diagnosis not present

## 2020-01-11 DIAGNOSIS — N3020 Other chronic cystitis without hematuria: Secondary | ICD-10-CM | POA: Diagnosis not present

## 2020-01-11 DIAGNOSIS — I129 Hypertensive chronic kidney disease with stage 1 through stage 4 chronic kidney disease, or unspecified chronic kidney disease: Secondary | ICD-10-CM | POA: Diagnosis not present

## 2020-01-11 DIAGNOSIS — Z792 Long term (current) use of antibiotics: Secondary | ICD-10-CM | POA: Diagnosis not present

## 2020-01-11 DIAGNOSIS — M1612 Unilateral primary osteoarthritis, left hip: Secondary | ICD-10-CM | POA: Diagnosis not present

## 2020-01-11 DIAGNOSIS — H903 Sensorineural hearing loss, bilateral: Secondary | ICD-10-CM | POA: Diagnosis not present

## 2020-01-11 DIAGNOSIS — G894 Chronic pain syndrome: Secondary | ICD-10-CM | POA: Diagnosis not present

## 2020-01-11 DIAGNOSIS — M4715 Other spondylosis with myelopathy, thoracolumbar region: Secondary | ICD-10-CM | POA: Diagnosis not present

## 2020-01-11 DIAGNOSIS — G629 Polyneuropathy, unspecified: Secondary | ICD-10-CM | POA: Diagnosis not present

## 2020-01-11 DIAGNOSIS — M25552 Pain in left hip: Secondary | ICD-10-CM | POA: Diagnosis not present

## 2020-01-11 DIAGNOSIS — N183 Chronic kidney disease, stage 3 unspecified: Secondary | ICD-10-CM | POA: Diagnosis not present

## 2020-01-11 DIAGNOSIS — M48061 Spinal stenosis, lumbar region without neurogenic claudication: Secondary | ICD-10-CM | POA: Diagnosis not present

## 2020-01-11 DIAGNOSIS — Z79891 Long term (current) use of opiate analgesic: Secondary | ICD-10-CM | POA: Diagnosis not present

## 2020-01-13 DIAGNOSIS — Z87440 Personal history of urinary (tract) infections: Secondary | ICD-10-CM | POA: Diagnosis not present

## 2020-01-13 DIAGNOSIS — G629 Polyneuropathy, unspecified: Secondary | ICD-10-CM | POA: Diagnosis not present

## 2020-01-13 DIAGNOSIS — M170 Bilateral primary osteoarthritis of knee: Secondary | ICD-10-CM | POA: Diagnosis not present

## 2020-01-13 DIAGNOSIS — Z79891 Long term (current) use of opiate analgesic: Secondary | ICD-10-CM | POA: Diagnosis not present

## 2020-01-13 DIAGNOSIS — M48061 Spinal stenosis, lumbar region without neurogenic claudication: Secondary | ICD-10-CM | POA: Diagnosis not present

## 2020-01-13 DIAGNOSIS — N183 Chronic kidney disease, stage 3 unspecified: Secondary | ICD-10-CM | POA: Diagnosis not present

## 2020-01-13 DIAGNOSIS — N3020 Other chronic cystitis without hematuria: Secondary | ICD-10-CM | POA: Diagnosis not present

## 2020-01-13 DIAGNOSIS — M25552 Pain in left hip: Secondary | ICD-10-CM | POA: Diagnosis not present

## 2020-01-13 DIAGNOSIS — I129 Hypertensive chronic kidney disease with stage 1 through stage 4 chronic kidney disease, or unspecified chronic kidney disease: Secondary | ICD-10-CM | POA: Diagnosis not present

## 2020-01-13 DIAGNOSIS — Z9181 History of falling: Secondary | ICD-10-CM | POA: Diagnosis not present

## 2020-01-13 DIAGNOSIS — Z792 Long term (current) use of antibiotics: Secondary | ICD-10-CM | POA: Diagnosis not present

## 2020-01-13 DIAGNOSIS — H903 Sensorineural hearing loss, bilateral: Secondary | ICD-10-CM | POA: Diagnosis not present

## 2020-01-13 DIAGNOSIS — M1612 Unilateral primary osteoarthritis, left hip: Secondary | ICD-10-CM | POA: Diagnosis not present

## 2020-01-13 DIAGNOSIS — M4715 Other spondylosis with myelopathy, thoracolumbar region: Secondary | ICD-10-CM | POA: Diagnosis not present

## 2020-01-13 DIAGNOSIS — G894 Chronic pain syndrome: Secondary | ICD-10-CM | POA: Diagnosis not present

## 2020-01-14 ENCOUNTER — Other Ambulatory Visit: Payer: Self-pay

## 2020-01-14 ENCOUNTER — Encounter: Payer: Self-pay | Admitting: Family Medicine

## 2020-01-14 ENCOUNTER — Ambulatory Visit (INDEPENDENT_AMBULATORY_CARE_PROVIDER_SITE_OTHER): Payer: Medicare Other | Admitting: Family Medicine

## 2020-01-14 VITALS — BP 138/78 | HR 74 | Wt 162.0 lb

## 2020-01-14 DIAGNOSIS — G894 Chronic pain syndrome: Secondary | ICD-10-CM

## 2020-01-14 DIAGNOSIS — D500 Iron deficiency anemia secondary to blood loss (chronic): Secondary | ICD-10-CM | POA: Diagnosis not present

## 2020-01-14 DIAGNOSIS — G629 Polyneuropathy, unspecified: Secondary | ICD-10-CM | POA: Diagnosis not present

## 2020-01-14 DIAGNOSIS — R413 Other amnesia: Secondary | ICD-10-CM

## 2020-01-14 DIAGNOSIS — G8929 Other chronic pain: Secondary | ICD-10-CM

## 2020-01-14 DIAGNOSIS — N319 Neuromuscular dysfunction of bladder, unspecified: Secondary | ICD-10-CM | POA: Diagnosis not present

## 2020-01-14 DIAGNOSIS — N1831 Chronic kidney disease, stage 3a: Secondary | ICD-10-CM | POA: Diagnosis not present

## 2020-01-14 DIAGNOSIS — I10 Essential (primary) hypertension: Secondary | ICD-10-CM

## 2020-01-14 DIAGNOSIS — G959 Disease of spinal cord, unspecified: Secondary | ICD-10-CM | POA: Diagnosis not present

## 2020-01-14 DIAGNOSIS — M48062 Spinal stenosis, lumbar region with neurogenic claudication: Secondary | ICD-10-CM

## 2020-01-14 DIAGNOSIS — N3020 Other chronic cystitis without hematuria: Secondary | ICD-10-CM

## 2020-01-14 DIAGNOSIS — M5441 Lumbago with sciatica, right side: Secondary | ICD-10-CM

## 2020-01-14 NOTE — Patient Instructions (Signed)
We are checking your hemoglobin for anemia and checking your kidney's and electrolytes.  If they are abnormal, Dr Vraj Denardo will call you.  If they are normal, I will send you a letter.

## 2020-01-15 DIAGNOSIS — M4715 Other spondylosis with myelopathy, thoracolumbar region: Secondary | ICD-10-CM | POA: Diagnosis not present

## 2020-01-15 DIAGNOSIS — M1612 Unilateral primary osteoarthritis, left hip: Secondary | ICD-10-CM | POA: Diagnosis not present

## 2020-01-15 DIAGNOSIS — Z9181 History of falling: Secondary | ICD-10-CM | POA: Diagnosis not present

## 2020-01-15 DIAGNOSIS — H903 Sensorineural hearing loss, bilateral: Secondary | ICD-10-CM | POA: Diagnosis not present

## 2020-01-15 DIAGNOSIS — Z87440 Personal history of urinary (tract) infections: Secondary | ICD-10-CM | POA: Diagnosis not present

## 2020-01-15 DIAGNOSIS — M48061 Spinal stenosis, lumbar region without neurogenic claudication: Secondary | ICD-10-CM | POA: Diagnosis not present

## 2020-01-15 DIAGNOSIS — G894 Chronic pain syndrome: Secondary | ICD-10-CM | POA: Diagnosis not present

## 2020-01-15 DIAGNOSIS — I129 Hypertensive chronic kidney disease with stage 1 through stage 4 chronic kidney disease, or unspecified chronic kidney disease: Secondary | ICD-10-CM | POA: Diagnosis not present

## 2020-01-15 DIAGNOSIS — Z79891 Long term (current) use of opiate analgesic: Secondary | ICD-10-CM | POA: Diagnosis not present

## 2020-01-15 DIAGNOSIS — M170 Bilateral primary osteoarthritis of knee: Secondary | ICD-10-CM | POA: Diagnosis not present

## 2020-01-15 DIAGNOSIS — N3020 Other chronic cystitis without hematuria: Secondary | ICD-10-CM | POA: Diagnosis not present

## 2020-01-15 DIAGNOSIS — N183 Chronic kidney disease, stage 3 unspecified: Secondary | ICD-10-CM | POA: Diagnosis not present

## 2020-01-15 DIAGNOSIS — G629 Polyneuropathy, unspecified: Secondary | ICD-10-CM | POA: Diagnosis not present

## 2020-01-15 DIAGNOSIS — M25552 Pain in left hip: Secondary | ICD-10-CM | POA: Diagnosis not present

## 2020-01-15 DIAGNOSIS — Z792 Long term (current) use of antibiotics: Secondary | ICD-10-CM | POA: Diagnosis not present

## 2020-01-15 LAB — BASIC METABOLIC PANEL
BUN/Creatinine Ratio: 21 (ref 12–28)
BUN: 21 mg/dL (ref 8–27)
CO2: 24 mmol/L (ref 20–29)
Calcium: 9.8 mg/dL (ref 8.7–10.3)
Chloride: 103 mmol/L (ref 96–106)
Creatinine, Ser: 1 mg/dL (ref 0.57–1.00)
GFR calc Af Amer: 60 mL/min/{1.73_m2} (ref >59)
GFR calc non Af Amer: 52 mL/min/{1.73_m2} — ABNORMAL LOW (ref >59)
Glucose: 87 mg/dL (ref 65–99)
Potassium: 3.8 mmol/L (ref 3.5–5.2)
Sodium: 141 mmol/L (ref 134–144)

## 2020-01-15 LAB — CBC
Hematocrit: 35.1 % (ref 34.0–46.6)
Hemoglobin: 11.4 g/dL (ref 11.1–15.9)
MCH: 28.3 pg (ref 26.6–33.0)
MCHC: 32.5 g/dL (ref 31.5–35.7)
MCV: 87 fL (ref 79–97)
Platelets: 263 10*3/uL (ref 150–450)
RBC: 4.03 x10E6/uL (ref 3.77–5.28)
RDW: 13.3 % (ref 11.7–15.4)
WBC: 4.6 10*3/uL (ref 3.4–10.8)

## 2020-01-15 NOTE — Assessment & Plan Note (Signed)
Established problem. Stable. Able to perform her ADLs when takes Norco.  She manages severe breakthru pain in buttocks and legs with oxycodone.  Denies sedation, falls, confusion.  [place] CSDB reviewed.  No evidence aberent behavior

## 2020-01-15 NOTE — Assessment & Plan Note (Addendum)
Adequate blood pressure control.  Stable BMET. No evidence of new end organ damage.  Tolerating medication without significant adverse effects.  Plan to continue current blood pressure regiment.

## 2020-01-15 NOTE — Assessment & Plan Note (Signed)
Established problem. Stable. 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   Continue current therapy Hydrocone/apap 10/325 q 6 hr prn, #120/month Tolerating opioid therapy.  No evidence of aberant behavior on [place] CSRS DB.  No longer taking Lyrica

## 2020-01-15 NOTE — Assessment & Plan Note (Signed)
Established problem. Stable. Continue current therapy Check BMET

## 2020-01-15 NOTE — Assessment & Plan Note (Signed)
Established problem Stable Hbg. Monitor periodically

## 2020-01-15 NOTE — Assessment & Plan Note (Signed)
Established problem. Stable. Continue current therapy  

## 2020-01-15 NOTE — Assessment & Plan Note (Signed)
Established problem. Stable. Able to perform her ADLs when takes Norco.  She manages severe breakthru pain in buttocks and legs with oxycodone.  Denies sedation, falls, confusion.  [place] CSDB reviewed.  No evidence aberent behavior

## 2020-01-15 NOTE — Assessment & Plan Note (Signed)
Established problem. Stable. Passed MiniCog test by [facility] nurse on 12/21/18 with 3/3 recall and normal Clock draw.

## 2020-01-15 NOTE — Assessment & Plan Note (Signed)
Established problem Responding to recent course of Doxycycline.

## 2020-01-18 ENCOUNTER — Encounter: Payer: Self-pay | Admitting: Family Medicine

## 2020-01-19 ENCOUNTER — Telehealth: Payer: Self-pay

## 2020-01-19 DIAGNOSIS — G894 Chronic pain syndrome: Secondary | ICD-10-CM | POA: Diagnosis not present

## 2020-01-19 DIAGNOSIS — N183 Chronic kidney disease, stage 3 unspecified: Secondary | ICD-10-CM | POA: Diagnosis not present

## 2020-01-19 DIAGNOSIS — M48061 Spinal stenosis, lumbar region without neurogenic claudication: Secondary | ICD-10-CM | POA: Diagnosis not present

## 2020-01-19 DIAGNOSIS — Z79891 Long term (current) use of opiate analgesic: Secondary | ICD-10-CM | POA: Diagnosis not present

## 2020-01-19 DIAGNOSIS — M170 Bilateral primary osteoarthritis of knee: Secondary | ICD-10-CM | POA: Diagnosis not present

## 2020-01-19 DIAGNOSIS — Z87440 Personal history of urinary (tract) infections: Secondary | ICD-10-CM | POA: Diagnosis not present

## 2020-01-19 DIAGNOSIS — G629 Polyneuropathy, unspecified: Secondary | ICD-10-CM | POA: Diagnosis not present

## 2020-01-19 DIAGNOSIS — H903 Sensorineural hearing loss, bilateral: Secondary | ICD-10-CM | POA: Diagnosis not present

## 2020-01-19 DIAGNOSIS — N3020 Other chronic cystitis without hematuria: Secondary | ICD-10-CM | POA: Diagnosis not present

## 2020-01-19 DIAGNOSIS — M25552 Pain in left hip: Secondary | ICD-10-CM | POA: Diagnosis not present

## 2020-01-19 DIAGNOSIS — I129 Hypertensive chronic kidney disease with stage 1 through stage 4 chronic kidney disease, or unspecified chronic kidney disease: Secondary | ICD-10-CM | POA: Diagnosis not present

## 2020-01-19 DIAGNOSIS — M1612 Unilateral primary osteoarthritis, left hip: Secondary | ICD-10-CM | POA: Diagnosis not present

## 2020-01-19 DIAGNOSIS — M4715 Other spondylosis with myelopathy, thoracolumbar region: Secondary | ICD-10-CM | POA: Diagnosis not present

## 2020-01-19 DIAGNOSIS — Z792 Long term (current) use of antibiotics: Secondary | ICD-10-CM | POA: Diagnosis not present

## 2020-01-19 DIAGNOSIS — Z9181 History of falling: Secondary | ICD-10-CM | POA: Diagnosis not present

## 2020-01-19 NOTE — Telephone Encounter (Signed)
Shanon Brow, home health PT, calls nurse line requesting a dme order placed for:  2 wheel rolling walker in youth size  The one the patient has now was given to her and is too big. Therefore, her posture is off. I can fax to [employer] when ready, so her daughter can pick up.   Let me know when placed. Thanks!

## 2020-01-21 ENCOUNTER — Telehealth: Payer: Self-pay

## 2020-01-21 ENCOUNTER — Other Ambulatory Visit: Payer: Self-pay | Admitting: Family Medicine

## 2020-01-21 DIAGNOSIS — Z792 Long term (current) use of antibiotics: Secondary | ICD-10-CM | POA: Diagnosis not present

## 2020-01-21 DIAGNOSIS — Z9181 History of falling: Secondary | ICD-10-CM | POA: Diagnosis not present

## 2020-01-21 DIAGNOSIS — Z7409 Other reduced mobility: Secondary | ICD-10-CM

## 2020-01-21 DIAGNOSIS — M4715 Other spondylosis with myelopathy, thoracolumbar region: Secondary | ICD-10-CM | POA: Diagnosis not present

## 2020-01-21 DIAGNOSIS — I129 Hypertensive chronic kidney disease with stage 1 through stage 4 chronic kidney disease, or unspecified chronic kidney disease: Secondary | ICD-10-CM | POA: Diagnosis not present

## 2020-01-21 DIAGNOSIS — N183 Chronic kidney disease, stage 3 unspecified: Secondary | ICD-10-CM | POA: Diagnosis not present

## 2020-01-21 DIAGNOSIS — M1612 Unilateral primary osteoarthritis, left hip: Secondary | ICD-10-CM | POA: Diagnosis not present

## 2020-01-21 DIAGNOSIS — Z87440 Personal history of urinary (tract) infections: Secondary | ICD-10-CM | POA: Diagnosis not present

## 2020-01-21 DIAGNOSIS — M170 Bilateral primary osteoarthritis of knee: Secondary | ICD-10-CM | POA: Diagnosis not present

## 2020-01-21 DIAGNOSIS — Z79891 Long term (current) use of opiate analgesic: Secondary | ICD-10-CM | POA: Diagnosis not present

## 2020-01-21 DIAGNOSIS — G629 Polyneuropathy, unspecified: Secondary | ICD-10-CM | POA: Diagnosis not present

## 2020-01-21 DIAGNOSIS — M25552 Pain in left hip: Secondary | ICD-10-CM | POA: Diagnosis not present

## 2020-01-21 DIAGNOSIS — N3020 Other chronic cystitis without hematuria: Secondary | ICD-10-CM | POA: Diagnosis not present

## 2020-01-21 DIAGNOSIS — M48061 Spinal stenosis, lumbar region without neurogenic claudication: Secondary | ICD-10-CM | POA: Diagnosis not present

## 2020-01-21 DIAGNOSIS — H903 Sensorineural hearing loss, bilateral: Secondary | ICD-10-CM | POA: Diagnosis not present

## 2020-01-21 DIAGNOSIS — G894 Chronic pain syndrome: Secondary | ICD-10-CM | POA: Diagnosis not present

## 2020-01-21 NOTE — Telephone Encounter (Signed)
Signed DME faxed to [place]. Family updated.

## 2020-01-21 NOTE — Telephone Encounter (Signed)
Order submitted for rolling walker, youth.

## 2020-01-26 DIAGNOSIS — Z9181 History of falling: Secondary | ICD-10-CM | POA: Diagnosis not present

## 2020-01-26 DIAGNOSIS — N3020 Other chronic cystitis without hematuria: Secondary | ICD-10-CM | POA: Diagnosis not present

## 2020-01-26 DIAGNOSIS — I129 Hypertensive chronic kidney disease with stage 1 through stage 4 chronic kidney disease, or unspecified chronic kidney disease: Secondary | ICD-10-CM | POA: Diagnosis not present

## 2020-01-26 DIAGNOSIS — G629 Polyneuropathy, unspecified: Secondary | ICD-10-CM | POA: Diagnosis not present

## 2020-01-26 DIAGNOSIS — M1612 Unilateral primary osteoarthritis, left hip: Secondary | ICD-10-CM | POA: Diagnosis not present

## 2020-01-26 DIAGNOSIS — G894 Chronic pain syndrome: Secondary | ICD-10-CM | POA: Diagnosis not present

## 2020-01-26 DIAGNOSIS — Z792 Long term (current) use of antibiotics: Secondary | ICD-10-CM | POA: Diagnosis not present

## 2020-01-26 DIAGNOSIS — M170 Bilateral primary osteoarthritis of knee: Secondary | ICD-10-CM | POA: Diagnosis not present

## 2020-01-26 DIAGNOSIS — N183 Chronic kidney disease, stage 3 unspecified: Secondary | ICD-10-CM | POA: Diagnosis not present

## 2020-01-26 DIAGNOSIS — Z87440 Personal history of urinary (tract) infections: Secondary | ICD-10-CM | POA: Diagnosis not present

## 2020-01-26 DIAGNOSIS — Z79891 Long term (current) use of opiate analgesic: Secondary | ICD-10-CM | POA: Diagnosis not present

## 2020-01-26 DIAGNOSIS — M4715 Other spondylosis with myelopathy, thoracolumbar region: Secondary | ICD-10-CM | POA: Diagnosis not present

## 2020-01-26 DIAGNOSIS — M48061 Spinal stenosis, lumbar region without neurogenic claudication: Secondary | ICD-10-CM | POA: Diagnosis not present

## 2020-01-26 DIAGNOSIS — M25552 Pain in left hip: Secondary | ICD-10-CM | POA: Diagnosis not present

## 2020-01-26 DIAGNOSIS — H903 Sensorineural hearing loss, bilateral: Secondary | ICD-10-CM | POA: Diagnosis not present

## 2020-01-29 DIAGNOSIS — N3020 Other chronic cystitis without hematuria: Secondary | ICD-10-CM | POA: Diagnosis not present

## 2020-01-29 DIAGNOSIS — M25552 Pain in left hip: Secondary | ICD-10-CM | POA: Diagnosis not present

## 2020-01-29 DIAGNOSIS — M170 Bilateral primary osteoarthritis of knee: Secondary | ICD-10-CM | POA: Diagnosis not present

## 2020-01-29 DIAGNOSIS — G629 Polyneuropathy, unspecified: Secondary | ICD-10-CM | POA: Diagnosis not present

## 2020-01-29 DIAGNOSIS — M1612 Unilateral primary osteoarthritis, left hip: Secondary | ICD-10-CM | POA: Diagnosis not present

## 2020-01-29 DIAGNOSIS — M4715 Other spondylosis with myelopathy, thoracolumbar region: Secondary | ICD-10-CM | POA: Diagnosis not present

## 2020-01-29 DIAGNOSIS — N183 Chronic kidney disease, stage 3 unspecified: Secondary | ICD-10-CM | POA: Diagnosis not present

## 2020-01-29 DIAGNOSIS — Z87440 Personal history of urinary (tract) infections: Secondary | ICD-10-CM | POA: Diagnosis not present

## 2020-01-29 DIAGNOSIS — M48061 Spinal stenosis, lumbar region without neurogenic claudication: Secondary | ICD-10-CM | POA: Diagnosis not present

## 2020-01-29 DIAGNOSIS — H903 Sensorineural hearing loss, bilateral: Secondary | ICD-10-CM | POA: Diagnosis not present

## 2020-01-29 DIAGNOSIS — Z79891 Long term (current) use of opiate analgesic: Secondary | ICD-10-CM | POA: Diagnosis not present

## 2020-01-29 DIAGNOSIS — Z9181 History of falling: Secondary | ICD-10-CM | POA: Diagnosis not present

## 2020-01-29 DIAGNOSIS — I129 Hypertensive chronic kidney disease with stage 1 through stage 4 chronic kidney disease, or unspecified chronic kidney disease: Secondary | ICD-10-CM | POA: Diagnosis not present

## 2020-01-29 DIAGNOSIS — Z792 Long term (current) use of antibiotics: Secondary | ICD-10-CM | POA: Diagnosis not present

## 2020-01-29 DIAGNOSIS — G894 Chronic pain syndrome: Secondary | ICD-10-CM | POA: Diagnosis not present

## 2020-02-02 DIAGNOSIS — Z87440 Personal history of urinary (tract) infections: Secondary | ICD-10-CM | POA: Diagnosis not present

## 2020-02-02 DIAGNOSIS — N183 Chronic kidney disease, stage 3 unspecified: Secondary | ICD-10-CM | POA: Diagnosis not present

## 2020-02-02 DIAGNOSIS — M25552 Pain in left hip: Secondary | ICD-10-CM | POA: Diagnosis not present

## 2020-02-02 DIAGNOSIS — M48061 Spinal stenosis, lumbar region without neurogenic claudication: Secondary | ICD-10-CM | POA: Diagnosis not present

## 2020-02-02 DIAGNOSIS — M1612 Unilateral primary osteoarthritis, left hip: Secondary | ICD-10-CM | POA: Diagnosis not present

## 2020-02-02 DIAGNOSIS — M170 Bilateral primary osteoarthritis of knee: Secondary | ICD-10-CM | POA: Diagnosis not present

## 2020-02-02 DIAGNOSIS — N3020 Other chronic cystitis without hematuria: Secondary | ICD-10-CM | POA: Diagnosis not present

## 2020-02-02 DIAGNOSIS — G894 Chronic pain syndrome: Secondary | ICD-10-CM | POA: Diagnosis not present

## 2020-02-02 DIAGNOSIS — M4715 Other spondylosis with myelopathy, thoracolumbar region: Secondary | ICD-10-CM | POA: Diagnosis not present

## 2020-02-02 DIAGNOSIS — Z792 Long term (current) use of antibiotics: Secondary | ICD-10-CM | POA: Diagnosis not present

## 2020-02-02 DIAGNOSIS — Z9181 History of falling: Secondary | ICD-10-CM | POA: Diagnosis not present

## 2020-02-02 DIAGNOSIS — I129 Hypertensive chronic kidney disease with stage 1 through stage 4 chronic kidney disease, or unspecified chronic kidney disease: Secondary | ICD-10-CM | POA: Diagnosis not present

## 2020-02-02 DIAGNOSIS — G629 Polyneuropathy, unspecified: Secondary | ICD-10-CM | POA: Diagnosis not present

## 2020-02-02 DIAGNOSIS — H903 Sensorineural hearing loss, bilateral: Secondary | ICD-10-CM | POA: Diagnosis not present

## 2020-02-02 DIAGNOSIS — Z79891 Long term (current) use of opiate analgesic: Secondary | ICD-10-CM | POA: Diagnosis not present

## 2020-02-05 DIAGNOSIS — M4715 Other spondylosis with myelopathy, thoracolumbar region: Secondary | ICD-10-CM | POA: Diagnosis not present

## 2020-02-05 DIAGNOSIS — N3020 Other chronic cystitis without hematuria: Secondary | ICD-10-CM | POA: Diagnosis not present

## 2020-02-05 DIAGNOSIS — Z87440 Personal history of urinary (tract) infections: Secondary | ICD-10-CM | POA: Diagnosis not present

## 2020-02-05 DIAGNOSIS — Z792 Long term (current) use of antibiotics: Secondary | ICD-10-CM | POA: Diagnosis not present

## 2020-02-05 DIAGNOSIS — M1612 Unilateral primary osteoarthritis, left hip: Secondary | ICD-10-CM | POA: Diagnosis not present

## 2020-02-05 DIAGNOSIS — M170 Bilateral primary osteoarthritis of knee: Secondary | ICD-10-CM | POA: Diagnosis not present

## 2020-02-05 DIAGNOSIS — G629 Polyneuropathy, unspecified: Secondary | ICD-10-CM | POA: Diagnosis not present

## 2020-02-05 DIAGNOSIS — H903 Sensorineural hearing loss, bilateral: Secondary | ICD-10-CM | POA: Diagnosis not present

## 2020-02-05 DIAGNOSIS — N183 Chronic kidney disease, stage 3 unspecified: Secondary | ICD-10-CM | POA: Diagnosis not present

## 2020-02-05 DIAGNOSIS — G894 Chronic pain syndrome: Secondary | ICD-10-CM | POA: Diagnosis not present

## 2020-02-05 DIAGNOSIS — Z79891 Long term (current) use of opiate analgesic: Secondary | ICD-10-CM | POA: Diagnosis not present

## 2020-02-05 DIAGNOSIS — M48061 Spinal stenosis, lumbar region without neurogenic claudication: Secondary | ICD-10-CM | POA: Diagnosis not present

## 2020-02-05 DIAGNOSIS — I129 Hypertensive chronic kidney disease with stage 1 through stage 4 chronic kidney disease, or unspecified chronic kidney disease: Secondary | ICD-10-CM | POA: Diagnosis not present

## 2020-02-05 DIAGNOSIS — Z9181 History of falling: Secondary | ICD-10-CM | POA: Diagnosis not present

## 2020-02-05 DIAGNOSIS — M25552 Pain in left hip: Secondary | ICD-10-CM | POA: Diagnosis not present

## 2020-02-06 ENCOUNTER — Ambulatory Visit: Payer: Medicare Other | Attending: Internal Medicine

## 2020-02-06 DIAGNOSIS — Z23 Encounter for immunization: Secondary | ICD-10-CM | POA: Insufficient documentation

## 2020-02-06 NOTE — Progress Notes (Signed)
   Covid-19 Vaccination Clinic  Name:  ALAZIAH PRANTE    MRN: [id number] DOB: Nov 07, 1933  02/06/2020  Ms. Hunley was observed post Covid-19 immunization for 15 minutes without incidence. She was provided with Vaccine Information Sheet and instruction to access the V-Safe system.   Ms. Kurz was instructed to call 911 with any severe reactions post vaccine: Marland Kitchen Difficulty breathing  . Swelling of your face and throat  . A fast heartbeat  . A bad rash all over your body  . Dizziness and weakness    Immunizations Administered    Name Date Dose VIS Date Route   Pfizer COVID-19 Vaccine 02/06/2020  8:09 AM 0.3 mL 11/27/2019 Intramuscular   Manufacturer: [place]   Lot: [id number]   [place]: [id number]

## 2020-02-09 DIAGNOSIS — G894 Chronic pain syndrome: Secondary | ICD-10-CM | POA: Diagnosis not present

## 2020-02-09 DIAGNOSIS — M25552 Pain in left hip: Secondary | ICD-10-CM | POA: Diagnosis not present

## 2020-02-09 DIAGNOSIS — I129 Hypertensive chronic kidney disease with stage 1 through stage 4 chronic kidney disease, or unspecified chronic kidney disease: Secondary | ICD-10-CM | POA: Diagnosis not present

## 2020-02-09 DIAGNOSIS — M170 Bilateral primary osteoarthritis of knee: Secondary | ICD-10-CM | POA: Diagnosis not present

## 2020-02-09 DIAGNOSIS — Z79891 Long term (current) use of opiate analgesic: Secondary | ICD-10-CM | POA: Diagnosis not present

## 2020-02-09 DIAGNOSIS — H903 Sensorineural hearing loss, bilateral: Secondary | ICD-10-CM | POA: Diagnosis not present

## 2020-02-09 DIAGNOSIS — Z7409 Other reduced mobility: Secondary | ICD-10-CM | POA: Diagnosis not present

## 2020-02-09 DIAGNOSIS — G629 Polyneuropathy, unspecified: Secondary | ICD-10-CM | POA: Diagnosis not present

## 2020-02-09 DIAGNOSIS — M48061 Spinal stenosis, lumbar region without neurogenic claudication: Secondary | ICD-10-CM | POA: Diagnosis not present

## 2020-02-09 DIAGNOSIS — M1612 Unilateral primary osteoarthritis, left hip: Secondary | ICD-10-CM | POA: Diagnosis not present

## 2020-02-09 DIAGNOSIS — Z87440 Personal history of urinary (tract) infections: Secondary | ICD-10-CM | POA: Diagnosis not present

## 2020-02-09 DIAGNOSIS — N183 Chronic kidney disease, stage 3 unspecified: Secondary | ICD-10-CM | POA: Diagnosis not present

## 2020-02-09 DIAGNOSIS — M4715 Other spondylosis with myelopathy, thoracolumbar region: Secondary | ICD-10-CM | POA: Diagnosis not present

## 2020-02-09 DIAGNOSIS — N3020 Other chronic cystitis without hematuria: Secondary | ICD-10-CM | POA: Diagnosis not present

## 2020-02-09 DIAGNOSIS — Z9181 History of falling: Secondary | ICD-10-CM | POA: Diagnosis not present

## 2020-02-09 DIAGNOSIS — Z792 Long term (current) use of antibiotics: Secondary | ICD-10-CM | POA: Diagnosis not present

## 2020-02-10 ENCOUNTER — Other Ambulatory Visit: Payer: Self-pay

## 2020-02-10 DIAGNOSIS — G629 Polyneuropathy, unspecified: Secondary | ICD-10-CM

## 2020-02-10 DIAGNOSIS — M160 Bilateral primary osteoarthritis of hip: Secondary | ICD-10-CM

## 2020-02-10 DIAGNOSIS — G959 Disease of spinal cord, unspecified: Secondary | ICD-10-CM

## 2020-02-10 DIAGNOSIS — M159 Polyosteoarthritis, unspecified: Secondary | ICD-10-CM

## 2020-02-10 DIAGNOSIS — G5731 Lesion of lateral popliteal nerve, right lower limb: Secondary | ICD-10-CM

## 2020-02-10 DIAGNOSIS — M48062 Spinal stenosis, lumbar region with neurogenic claudication: Secondary | ICD-10-CM

## 2020-02-10 DIAGNOSIS — G8929 Other chronic pain: Secondary | ICD-10-CM

## 2020-02-10 DIAGNOSIS — M170 Bilateral primary osteoarthritis of knee: Secondary | ICD-10-CM

## 2020-02-10 DIAGNOSIS — M47812 Spondylosis without myelopathy or radiculopathy, cervical region: Secondary | ICD-10-CM

## 2020-02-10 DIAGNOSIS — M4804 Spinal stenosis, thoracic region: Secondary | ICD-10-CM

## 2020-02-11 ENCOUNTER — Other Ambulatory Visit: Payer: Self-pay

## 2020-02-11 DIAGNOSIS — M4715 Other spondylosis with myelopathy, thoracolumbar region: Secondary | ICD-10-CM | POA: Diagnosis not present

## 2020-02-11 DIAGNOSIS — I129 Hypertensive chronic kidney disease with stage 1 through stage 4 chronic kidney disease, or unspecified chronic kidney disease: Secondary | ICD-10-CM | POA: Diagnosis not present

## 2020-02-11 DIAGNOSIS — Z79891 Long term (current) use of opiate analgesic: Secondary | ICD-10-CM | POA: Diagnosis not present

## 2020-02-11 DIAGNOSIS — M1612 Unilateral primary osteoarthritis, left hip: Secondary | ICD-10-CM | POA: Diagnosis not present

## 2020-02-11 DIAGNOSIS — M170 Bilateral primary osteoarthritis of knee: Secondary | ICD-10-CM | POA: Diagnosis not present

## 2020-02-11 DIAGNOSIS — Z87440 Personal history of urinary (tract) infections: Secondary | ICD-10-CM | POA: Diagnosis not present

## 2020-02-11 DIAGNOSIS — G629 Polyneuropathy, unspecified: Secondary | ICD-10-CM | POA: Diagnosis not present

## 2020-02-11 DIAGNOSIS — Z9181 History of falling: Secondary | ICD-10-CM | POA: Diagnosis not present

## 2020-02-11 DIAGNOSIS — M25552 Pain in left hip: Secondary | ICD-10-CM | POA: Diagnosis not present

## 2020-02-11 DIAGNOSIS — Z792 Long term (current) use of antibiotics: Secondary | ICD-10-CM | POA: Diagnosis not present

## 2020-02-11 DIAGNOSIS — N3020 Other chronic cystitis without hematuria: Secondary | ICD-10-CM | POA: Diagnosis not present

## 2020-02-11 DIAGNOSIS — N183 Chronic kidney disease, stage 3 unspecified: Secondary | ICD-10-CM | POA: Diagnosis not present

## 2020-02-11 DIAGNOSIS — H903 Sensorineural hearing loss, bilateral: Secondary | ICD-10-CM | POA: Diagnosis not present

## 2020-02-11 DIAGNOSIS — G894 Chronic pain syndrome: Secondary | ICD-10-CM | POA: Diagnosis not present

## 2020-02-11 DIAGNOSIS — M48061 Spinal stenosis, lumbar region without neurogenic claudication: Secondary | ICD-10-CM | POA: Diagnosis not present

## 2020-02-11 NOTE — Telephone Encounter (Signed)
Pt calling to check on refill request. Pt only has one pill left. Ottis Stain, CMA

## 2020-02-12 MED ORDER — HYDROCODONE-ACETAMINOPHEN 10-325 MG PO TABS: 1.0000 | ORAL_TABLET | Freq: Four times a day (QID) | ORAL | 0 refills | Status: DC | PRN

## 2020-02-16 DIAGNOSIS — Z9181 History of falling: Secondary | ICD-10-CM | POA: Diagnosis not present

## 2020-02-16 DIAGNOSIS — G629 Polyneuropathy, unspecified: Secondary | ICD-10-CM | POA: Diagnosis not present

## 2020-02-16 DIAGNOSIS — Z792 Long term (current) use of antibiotics: Secondary | ICD-10-CM | POA: Diagnosis not present

## 2020-02-16 DIAGNOSIS — H903 Sensorineural hearing loss, bilateral: Secondary | ICD-10-CM | POA: Diagnosis not present

## 2020-02-16 DIAGNOSIS — M48061 Spinal stenosis, lumbar region without neurogenic claudication: Secondary | ICD-10-CM | POA: Diagnosis not present

## 2020-02-16 DIAGNOSIS — M170 Bilateral primary osteoarthritis of knee: Secondary | ICD-10-CM | POA: Diagnosis not present

## 2020-02-16 DIAGNOSIS — N3020 Other chronic cystitis without hematuria: Secondary | ICD-10-CM | POA: Diagnosis not present

## 2020-02-16 DIAGNOSIS — I129 Hypertensive chronic kidney disease with stage 1 through stage 4 chronic kidney disease, or unspecified chronic kidney disease: Secondary | ICD-10-CM | POA: Diagnosis not present

## 2020-02-16 DIAGNOSIS — M4715 Other spondylosis with myelopathy, thoracolumbar region: Secondary | ICD-10-CM | POA: Diagnosis not present

## 2020-02-16 DIAGNOSIS — Z87440 Personal history of urinary (tract) infections: Secondary | ICD-10-CM | POA: Diagnosis not present

## 2020-02-16 DIAGNOSIS — G894 Chronic pain syndrome: Secondary | ICD-10-CM | POA: Diagnosis not present

## 2020-02-16 DIAGNOSIS — M25552 Pain in left hip: Secondary | ICD-10-CM | POA: Diagnosis not present

## 2020-02-16 DIAGNOSIS — Z79891 Long term (current) use of opiate analgesic: Secondary | ICD-10-CM | POA: Diagnosis not present

## 2020-02-16 DIAGNOSIS — M1612 Unilateral primary osteoarthritis, left hip: Secondary | ICD-10-CM | POA: Diagnosis not present

## 2020-02-16 DIAGNOSIS — N183 Chronic kidney disease, stage 3 unspecified: Secondary | ICD-10-CM | POA: Diagnosis not present

## 2020-02-23 DIAGNOSIS — M4715 Other spondylosis with myelopathy, thoracolumbar region: Secondary | ICD-10-CM | POA: Diagnosis not present

## 2020-02-23 DIAGNOSIS — M25552 Pain in left hip: Secondary | ICD-10-CM | POA: Diagnosis not present

## 2020-02-23 DIAGNOSIS — Z79891 Long term (current) use of opiate analgesic: Secondary | ICD-10-CM | POA: Diagnosis not present

## 2020-02-23 DIAGNOSIS — M48061 Spinal stenosis, lumbar region without neurogenic claudication: Secondary | ICD-10-CM | POA: Diagnosis not present

## 2020-02-23 DIAGNOSIS — N183 Chronic kidney disease, stage 3 unspecified: Secondary | ICD-10-CM | POA: Diagnosis not present

## 2020-02-23 DIAGNOSIS — M170 Bilateral primary osteoarthritis of knee: Secondary | ICD-10-CM | POA: Diagnosis not present

## 2020-02-23 DIAGNOSIS — G894 Chronic pain syndrome: Secondary | ICD-10-CM | POA: Diagnosis not present

## 2020-02-23 DIAGNOSIS — G629 Polyneuropathy, unspecified: Secondary | ICD-10-CM | POA: Diagnosis not present

## 2020-02-23 DIAGNOSIS — N3020 Other chronic cystitis without hematuria: Secondary | ICD-10-CM | POA: Diagnosis not present

## 2020-02-23 DIAGNOSIS — M1612 Unilateral primary osteoarthritis, left hip: Secondary | ICD-10-CM | POA: Diagnosis not present

## 2020-02-23 DIAGNOSIS — Z87440 Personal history of urinary (tract) infections: Secondary | ICD-10-CM | POA: Diagnosis not present

## 2020-02-23 DIAGNOSIS — I129 Hypertensive chronic kidney disease with stage 1 through stage 4 chronic kidney disease, or unspecified chronic kidney disease: Secondary | ICD-10-CM | POA: Diagnosis not present

## 2020-02-23 DIAGNOSIS — Z792 Long term (current) use of antibiotics: Secondary | ICD-10-CM | POA: Diagnosis not present

## 2020-02-23 DIAGNOSIS — H903 Sensorineural hearing loss, bilateral: Secondary | ICD-10-CM | POA: Diagnosis not present

## 2020-02-23 DIAGNOSIS — Z9181 History of falling: Secondary | ICD-10-CM | POA: Diagnosis not present

## 2020-03-01 ENCOUNTER — Ambulatory Visit: Payer: Medicare Other | Attending: Internal Medicine

## 2020-03-01 DIAGNOSIS — Z23 Encounter for immunization: Secondary | ICD-10-CM

## 2020-03-01 NOTE — Progress Notes (Signed)
   Covid-19 Vaccination Clinic  Name:  Nicole Perez    MRN: [id number] DOB: 1933/06/19  03/01/2020  [name] was observed post Covid-19 immunization for 15 minutes without incident. She was provided with Vaccine Information Sheet and instruction to access the V-Safe system.   [name] was instructed to call 911 with any severe reactions post vaccine: Marland Kitchen Difficulty breathing  . Swelling of face and throat  . A fast heartbeat  . A bad rash all over body  . Dizziness and weakness   Immunizations Administered    Name Date Dose VIS Date Route   Pfizer COVID-19 Vaccine 03/01/2020  1:46 PM 0.3 mL 11/27/2019 Intramuscular   Manufacturer: [place]   Lot: [id number]   [place]: [id number]

## 2020-03-04 ENCOUNTER — Other Ambulatory Visit: Payer: Self-pay | Admitting: Family Medicine

## 2020-03-04 ENCOUNTER — Telehealth: Payer: Self-pay | Admitting: Family Medicine

## 2020-03-04 ENCOUNTER — Encounter: Payer: Self-pay | Admitting: Family Medicine

## 2020-03-04 DIAGNOSIS — N3020 Other chronic cystitis without hematuria: Secondary | ICD-10-CM

## 2020-03-04 DIAGNOSIS — N390 Urinary tract infection, site not specified: Secondary | ICD-10-CM

## 2020-03-04 MED ORDER — DOXYCYCLINE HYCLATE 100 MG PO TABS: 100.0000 mg | ORAL_TABLET | Freq: Two times a day (BID) | ORAL | 3 refills | Status: DC

## 2020-03-04 NOTE — Telephone Encounter (Signed)
Patient contacted me thru my cell phoe c/o severe dysuria similar to her prior symptoms that have responded to doxy in past. Pt with freq recurrent dysuria ascibred to her self-catheterization of neurogenic bladder and acute exacerbations of her chronic cystitis.  Will treat dysuria symptom with Doxy 100 mg po bid x 7 days.    Will provide 3 refills so pt may sefl=initiate this tx with future recurrence of sxs.  Pt knows that if she does not improve or worsens with doxy, she is to call our office or go to UC/ED.

## 2020-03-04 NOTE — Telephone Encounter (Signed)
Patient calling again to check the status 

## 2020-03-08 ENCOUNTER — Other Ambulatory Visit: Payer: Self-pay | Admitting: Family Medicine

## 2020-03-08 DIAGNOSIS — Z1231 Encounter for screening mammogram for malignant neoplasm of breast: Secondary | ICD-10-CM

## 2020-03-08 NOTE — Telephone Encounter (Signed)
Opened in error

## 2020-03-22 DIAGNOSIS — R339 Retention of urine, unspecified: Secondary | ICD-10-CM | POA: Diagnosis not present

## 2020-03-24 ENCOUNTER — Other Ambulatory Visit: Payer: Self-pay

## 2020-03-24 DIAGNOSIS — M48062 Spinal stenosis, lumbar region with neurogenic claudication: Secondary | ICD-10-CM

## 2020-03-24 DIAGNOSIS — G629 Polyneuropathy, unspecified: Secondary | ICD-10-CM

## 2020-03-24 DIAGNOSIS — M170 Bilateral primary osteoarthritis of knee: Secondary | ICD-10-CM

## 2020-03-24 DIAGNOSIS — M47812 Spondylosis without myelopathy or radiculopathy, cervical region: Secondary | ICD-10-CM

## 2020-03-24 DIAGNOSIS — G959 Disease of spinal cord, unspecified: Secondary | ICD-10-CM

## 2020-03-24 DIAGNOSIS — M5441 Lumbago with sciatica, right side: Secondary | ICD-10-CM

## 2020-03-24 DIAGNOSIS — M160 Bilateral primary osteoarthritis of hip: Secondary | ICD-10-CM

## 2020-03-24 DIAGNOSIS — M159 Polyosteoarthritis, unspecified: Secondary | ICD-10-CM

## 2020-03-24 DIAGNOSIS — M4804 Spinal stenosis, thoracic region: Secondary | ICD-10-CM

## 2020-03-24 DIAGNOSIS — G8929 Other chronic pain: Secondary | ICD-10-CM

## 2020-03-24 DIAGNOSIS — G5731 Lesion of lateral popliteal nerve, right lower limb: Secondary | ICD-10-CM

## 2020-03-24 MED ORDER — HYDROCODONE-ACETAMINOPHEN 10-325 MG PO TABS: 1.0000 | ORAL_TABLET | Freq: Four times a day (QID) | ORAL | 0 refills | Status: DC | PRN

## 2020-04-13 ENCOUNTER — Ambulatory Visit: Payer: Medicare Other

## 2020-04-18 ENCOUNTER — Other Ambulatory Visit: Payer: Self-pay

## 2020-04-18 ENCOUNTER — Ambulatory Visit
Admission: RE | Admit: 2020-04-18 | Discharge: 2020-04-18 | Disposition: A | Discharge status: Home / Self Care | Payer: Medicare Other | Source: Ambulatory Visit | Attending: Family Medicine | Admitting: Family Medicine

## 2020-04-18 DIAGNOSIS — Z1231 Encounter for screening mammogram for malignant neoplasm of breast: Secondary | ICD-10-CM

## 2020-04-26 ENCOUNTER — Other Ambulatory Visit: Payer: Self-pay

## 2020-04-26 DIAGNOSIS — M47812 Spondylosis without myelopathy or radiculopathy, cervical region: Secondary | ICD-10-CM

## 2020-04-26 DIAGNOSIS — G959 Disease of spinal cord, unspecified: Secondary | ICD-10-CM

## 2020-04-26 DIAGNOSIS — M170 Bilateral primary osteoarthritis of knee: Secondary | ICD-10-CM

## 2020-04-26 DIAGNOSIS — M5441 Lumbago with sciatica, right side: Secondary | ICD-10-CM

## 2020-04-26 DIAGNOSIS — G8929 Other chronic pain: Secondary | ICD-10-CM

## 2020-04-26 DIAGNOSIS — M4804 Spinal stenosis, thoracic region: Secondary | ICD-10-CM

## 2020-04-26 DIAGNOSIS — M159 Polyosteoarthritis, unspecified: Secondary | ICD-10-CM

## 2020-04-26 DIAGNOSIS — M48062 Spinal stenosis, lumbar region with neurogenic claudication: Secondary | ICD-10-CM

## 2020-04-26 DIAGNOSIS — M160 Bilateral primary osteoarthritis of hip: Secondary | ICD-10-CM

## 2020-04-26 DIAGNOSIS — G5731 Lesion of lateral popliteal nerve, right lower limb: Secondary | ICD-10-CM

## 2020-04-26 DIAGNOSIS — G629 Polyneuropathy, unspecified: Secondary | ICD-10-CM

## 2020-04-27 MED ORDER — HYDROCODONE-ACETAMINOPHEN 10-325 MG PO TABS: 1.0000 | ORAL_TABLET | Freq: Four times a day (QID) | ORAL | 0 refills | Status: DC | PRN

## 2020-05-13 ENCOUNTER — Other Ambulatory Visit: Payer: Self-pay | Admitting: Family Medicine

## 2020-05-13 DIAGNOSIS — I10 Essential (primary) hypertension: Secondary | ICD-10-CM

## 2020-05-25 ENCOUNTER — Telehealth: Payer: Self-pay | Admitting: Family Medicine

## 2020-05-25 NOTE — Telephone Encounter (Signed)
Patients daughter brought in paperwork to be filled out for patient to receive at [facility]. Last DOS: 01/14/2020 They would like to pick it up when it is complete. [phone] OR [phone]

## 2020-05-27 ENCOUNTER — Encounter (HOSPITAL_COMMUNITY): Payer: Self-pay

## 2020-05-27 ENCOUNTER — Ambulatory Visit (HOSPITAL_COMMUNITY)
Admission: EM | Admit: 2020-05-27 | Discharge: 2020-05-27 | Disposition: A | Discharge status: Home / Self Care | Payer: Medicare Other | Attending: Family Medicine | Admitting: Family Medicine

## 2020-05-27 ENCOUNTER — Other Ambulatory Visit: Payer: Self-pay

## 2020-05-27 DIAGNOSIS — N12 Tubulo-interstitial nephritis, not specified as acute or chronic: Secondary | ICD-10-CM

## 2020-05-27 DIAGNOSIS — R109 Unspecified abdominal pain: Secondary | ICD-10-CM

## 2020-05-27 LAB — POCT URINALYSIS DIP (DEVICE)
Bilirubin Urine: NEGATIVE
Glucose, UA: NEGATIVE mg/dL
Hgb urine dipstick: NEGATIVE
Ketones, ur: NEGATIVE mg/dL
Nitrite: POSITIVE — AB
Protein, ur: NEGATIVE mg/dL
Specific Gravity, Urine: 1.025 (ref 1.005–1.030)
Urobilinogen, UA: 0.2 mg/dL (ref 0.0–1.0)
pH: 6 (ref 5.0–8.0)

## 2020-05-27 MED ORDER — HYDROCODONE-ACETAMINOPHEN 5-325 MG PO TABS: 1.0000 | ORAL_TABLET | Freq: Four times a day (QID) | ORAL | 0 refills | Status: DC | PRN

## 2020-05-27 MED ORDER — SULFAMETHOXAZOLE-TRIMETHOPRIM 800-160 MG PO TABS
1.0000 | ORAL_TABLET | Freq: Two times a day (BID) | ORAL | 0 refills | Status: AC
Start: 2020-05-27 — End: 2020-06-10

## 2020-05-27 MED ORDER — ONDANSETRON HCL 4 MG PO TABS
4.0000 mg | ORAL_TABLET | Freq: Four times a day (QID) | ORAL | 0 refills | Status: DC
Start: 2020-05-27 — End: 2020-09-29

## 2020-05-27 NOTE — ED Triage Notes (Signed)
Pt c/o 8/10 right flank painx1 wk. Pt states she catheterizes herself.
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
## 2020-05-27 NOTE — Telephone Encounter (Signed)
Clinical info completed on Handicap form.  Handed to PCP in clinic for completion.  Salvatore Marvel, CMA

## 2020-05-27 NOTE — Discharge Instructions (Addendum)
Take the antibiotic 2 times a day Plenty of water Take extra pain medication if you need them.  Take your usual pain pill every 6 hours as needed.  After 30 minutes to 1 hour if your pain is still severe, you may take a second pill. You may take a maximum of 6 pills a day  I have prescribed Zofran in case you needed for nausea or vomiting  Call your doctor Monday for follow-up next week

## 2020-05-29 LAB — URINE CULTURE: Culture: >=100000 — AB

## 2020-05-30 NOTE — Telephone Encounter (Signed)
Attempted to reach pt to inform that form is ready. No anwer. LVM stating that form is at the front and ready for pick up. Salvatore Marvel, CMA

## 2020-05-31 ENCOUNTER — Telehealth: Payer: Self-pay

## 2020-05-31 ENCOUNTER — Other Ambulatory Visit: Payer: Self-pay

## 2020-05-31 ENCOUNTER — Encounter: Payer: Self-pay | Admitting: Family Medicine

## 2020-05-31 ENCOUNTER — Ambulatory Visit (INDEPENDENT_AMBULATORY_CARE_PROVIDER_SITE_OTHER): Payer: Medicare Other | Admitting: Family Medicine

## 2020-05-31 ENCOUNTER — Ambulatory Visit
Admission: RE | Admit: 2020-05-31 | Discharge: 2020-05-31 | Disposition: A | Discharge status: Home / Self Care | Payer: Medicare Other | Source: Ambulatory Visit | Attending: Family Medicine | Admitting: Family Medicine

## 2020-05-31 ENCOUNTER — Other Ambulatory Visit: Payer: Self-pay | Admitting: Family Medicine

## 2020-05-31 VITALS — BP 158/80 | HR 90 | Wt 156.2 lb

## 2020-05-31 DIAGNOSIS — R109 Unspecified abdominal pain: Secondary | ICD-10-CM | POA: Diagnosis not present

## 2020-05-31 DIAGNOSIS — M170 Bilateral primary osteoarthritis of knee: Secondary | ICD-10-CM

## 2020-05-31 DIAGNOSIS — G5731 Lesion of lateral popliteal nerve, right lower limb: Secondary | ICD-10-CM

## 2020-05-31 DIAGNOSIS — M47812 Spondylosis without myelopathy or radiculopathy, cervical region: Secondary | ICD-10-CM

## 2020-05-31 DIAGNOSIS — M4804 Spinal stenosis, thoracic region: Secondary | ICD-10-CM

## 2020-05-31 DIAGNOSIS — M159 Polyosteoarthritis, unspecified: Secondary | ICD-10-CM

## 2020-05-31 DIAGNOSIS — G8929 Other chronic pain: Secondary | ICD-10-CM | POA: Insufficient documentation

## 2020-05-31 DIAGNOSIS — M48062 Spinal stenosis, lumbar region with neurogenic claudication: Secondary | ICD-10-CM

## 2020-05-31 DIAGNOSIS — G6289 Other specified polyneuropathies: Secondary | ICD-10-CM

## 2020-05-31 DIAGNOSIS — R0781 Pleurodynia: Secondary | ICD-10-CM | POA: Diagnosis not present

## 2020-05-31 DIAGNOSIS — M160 Bilateral primary osteoarthritis of hip: Secondary | ICD-10-CM

## 2020-05-31 DIAGNOSIS — G629 Polyneuropathy, unspecified: Secondary | ICD-10-CM

## 2020-05-31 DIAGNOSIS — G959 Disease of spinal cord, unspecified: Secondary | ICD-10-CM

## 2020-05-31 DIAGNOSIS — M5441 Lumbago with sciatica, right side: Secondary | ICD-10-CM

## 2020-05-31 MED ORDER — GABAPENTIN 100 MG PO CAPS: 300.0000 mg | ORAL_CAPSULE | Freq: Three times a day (TID) | ORAL | 3 refills | Status: DC

## 2020-05-31 NOTE — Telephone Encounter (Signed)
Patient (on speaker phone) and daughter call nurse line to ask about recent OV. Patient is upset that she went to pharmacy and oxycodone was not there, instead Gabapentin. Per office notes, looks like [facility] did mention Oxycodone, however it was not sent in. Patient is on Hydrocodone, however she is out of that. Last refill was on 05/12 by PCP. Will forward to No Name to see if he intended on sending in Oxycodone or preferred to defer Hydrocodone refill to PCP. Per daughter, the patient can not take Gabapentin due to undesired side effects.

## 2020-05-31 NOTE — Patient Instructions (Signed)
Please go get your oxycodone refilled for pain. We are also starting you on gabapentin for pain. Finish you antibiotic. Go get your rib xray at   [facility] Address: [address], [place], [place] [address] Hours:  Open ? Closes 5PM Phone: [phone]

## 2020-05-31 NOTE — Assessment & Plan Note (Signed)
Flank pain.  Seems musculoskeletal.  Although urine did grow E. coli, patient is a history of prior urines growing the same thing.  1 would expect patient to be improving clinically as E. coli was sensitive to Bactrim,  -Continue to biotic -Continue oxycodone for pain -Rib x-ray to assess for possible rib fracture

## 2020-05-31 NOTE — Assessment & Plan Note (Signed)
Plan to restart gabapentin

## 2020-05-31 NOTE — Progress Notes (Signed)
    SUBJECTIVE:   CHIEF COMPLAINT / HPI:   Follow up on right flank pain.  Dx on 6/11 at UC with pyonephritis.  Urine was positive for leukocytes esterase and nitrites.  Urine culture showed E. coli sensitive to Bactrim.  Patient was treated with a course of Bactrim.  She still having right flank pain.  Patient has had right flank pain for about 1 week. No f/c, no n/v, no dysuria, endorses urinary frequency. Describes flank pain it as a cutting feeling. Patient does self cath.  Denies any injury to the area  PERTINENT  PMH / PSH: Patient with history of chronic pain that is controlled on oxycodone.  Patient with history of bilateral peripheral neuropathy previously on gabapentin, but no longer.  She is endorsing recurrence of her bilateral extremity neuropathy.  OBJECTIVE:   BP (!) 158/80   Pulse 90   Wt 156 lb 3.2 oz (70.9 kg)   LMP 12/17/1962   SpO2 97%   BMI 31.55 kg/m   Gen: NAD, resting comfortably CV: RRR with no murmurs appreciated Pulm: NWOB, CTAB with no crackles, wheezes, or rhonchi GI: Soft, Nontender, Nondistended. MSK: focal right flank tenderness, no overlying bruising or contusion Skin: warm, dry Neuro: grossly normal, moves all extremities Psych: Normal affect and thought content   ASSESSMENT/PLAN:   Flank pain Flank pain.  Seems musculoskeletal.  Although urine did grow E. coli, patient is a history of prior urines growing the same thing.  1 would expect patient to be improving clinically as E. coli was sensitive to Bactrim,  -Continue to biotic -Continue oxycodone for pain -Rib x-ray to assess for possible rib fracture  Peripheral neuropathy Plan to restart gabapentin     Bonnita Hollow, MD [place]

## 2020-06-01 ENCOUNTER — Other Ambulatory Visit: Payer: Self-pay | Admitting: Family Medicine

## 2020-06-01 DIAGNOSIS — I712 Thoracic aortic aneurysm, without rupture, unspecified: Secondary | ICD-10-CM

## 2020-06-01 MED ORDER — HYDROCODONE-ACETAMINOPHEN 10-325 MG PO TABS: 1.0000 | ORAL_TABLET | Freq: Four times a day (QID) | ORAL | 0 refills | Status: DC | PRN

## 2020-06-01 NOTE — Telephone Encounter (Signed)
Pt is calling again to ask about pain meds.  Also, received call report from [facility].  Please see impression #1 and #4 in particular.  To PCP and provider who saw yesterday.  Christen Bame, CMA

## 2020-06-01 NOTE — Telephone Encounter (Signed)
Left voice message on cell phone of Ms Nicole Perez, patient's dgt, requesting her to call back our office to discuss her mother's abnormal Xray findings from yesterday.   Indicated concern that aorta may have develped an aneurysm and whether this change accounts for her mother's new thoracic back pain.  I also spoke with Ms Nicole Perez by phone about her new back pain, that was better today, her abnormal Xray findings with its concern for aorta dilation, role an aortic dilation could have in back pain, and recommendation for Chest CT.   I informed Ms Nicole Perez that her Norco prescription for the month was sent into pharmacy today.  I asked Ms Nicole Perez to not take gabapentin for her legs' neuropathic pains.  I will discuss gabpentin with her at our next ov.  I asked her to keep the bottle of gabapentin dispensed to her yesterday for possible future use.

## 2020-06-01 NOTE — Telephone Encounter (Signed)
Spoke with pcp and order has been placed for Ct scan.  I left message for daughter and patient that ct has been ordered for 06-06-20 at 4pm at [facility] imaging at [address] ([facility]).  Will continue to try and reach patient/daughter to let them know.  Will also send to MD so he is aware. Evyn Putzier,CMA

## 2020-06-01 NOTE — Telephone Encounter (Signed)
[name] called back and LM on nurse line.  Pt told her that she was going to get an MRI.   I do not see an order for MRI or CT.  Back to PCP for next steps. Christen Bame, CMA

## 2020-06-02 NOTE — Telephone Encounter (Signed)
Spoke with pt. Informed her of time of CT scan. She stated that she was informed and she told her daughter the time, day, and location of appt. Salvatore Marvel, CMA

## 2020-06-06 ENCOUNTER — Other Ambulatory Visit: Payer: Self-pay | Admitting: Family Medicine

## 2020-06-06 ENCOUNTER — Ambulatory Visit
Admission: RE | Admit: 2020-06-06 | Discharge: 2020-06-06 | Disposition: A | Discharge status: Home / Self Care | Payer: Medicare Other | Source: Ambulatory Visit | Attending: Family Medicine | Admitting: Family Medicine

## 2020-06-06 DIAGNOSIS — I712 Thoracic aortic aneurysm, without rupture, unspecified: Secondary | ICD-10-CM

## 2020-06-06 DIAGNOSIS — J439 Emphysema, unspecified: Secondary | ICD-10-CM

## 2020-06-06 HISTORY — DX: Emphysema, unspecified: J43.9

## 2020-06-08 ENCOUNTER — Encounter: Payer: Self-pay | Admitting: Family Medicine

## 2020-06-08 ENCOUNTER — Telehealth: Payer: Self-pay | Admitting: Family Medicine

## 2020-06-08 DIAGNOSIS — K8020 Calculus of gallbladder without cholecystitis without obstruction: Secondary | ICD-10-CM | POA: Insufficient documentation

## 2020-06-08 DIAGNOSIS — I712 Thoracic aortic aneurysm, without rupture, unspecified: Secondary | ICD-10-CM

## 2020-06-08 DIAGNOSIS — I719 Aortic aneurysm of unspecified site, without rupture: Secondary | ICD-10-CM | POA: Insufficient documentation

## 2020-06-08 DIAGNOSIS — I700 Atherosclerosis of aorta: Secondary | ICD-10-CM

## 2020-06-08 HISTORY — DX: Thoracic aortic aneurysm, without rupture: I71.2

## 2020-06-08 HISTORY — DX: Thoracic aortic aneurysm, without rupture, unspecified: I71.20

## 2020-06-08 HISTORY — DX: Atherosclerosis of aorta: I70.0

## 2020-06-08 NOTE — Telephone Encounter (Signed)
I spokw by phone with Ms Nicole Perez, daughter of Ms Nicole Perez.  We discused the Chest CT finding from today of a thoracic aortic aneurysm that should be evaluated and likely monitored by Vascular Surgery.    Ms Nicole Perez will relay this information and referral recommendation to Ms Nicole Perez.   Ms Nicole Perez agreed to referral to [facility].   Referral [facility] placed.

## 2020-06-28 ENCOUNTER — Other Ambulatory Visit: Payer: Self-pay

## 2020-06-28 DIAGNOSIS — M4804 Spinal stenosis, thoracic region: Secondary | ICD-10-CM

## 2020-06-28 DIAGNOSIS — G629 Polyneuropathy, unspecified: Secondary | ICD-10-CM

## 2020-06-28 DIAGNOSIS — M47812 Spondylosis without myelopathy or radiculopathy, cervical region: Secondary | ICD-10-CM

## 2020-06-28 DIAGNOSIS — G8929 Other chronic pain: Secondary | ICD-10-CM

## 2020-06-28 DIAGNOSIS — G959 Disease of spinal cord, unspecified: Secondary | ICD-10-CM

## 2020-06-28 DIAGNOSIS — G5731 Lesion of lateral popliteal nerve, right lower limb: Secondary | ICD-10-CM

## 2020-06-28 DIAGNOSIS — M159 Polyosteoarthritis, unspecified: Secondary | ICD-10-CM

## 2020-06-28 DIAGNOSIS — M48062 Spinal stenosis, lumbar region with neurogenic claudication: Secondary | ICD-10-CM

## 2020-06-28 DIAGNOSIS — M170 Bilateral primary osteoarthritis of knee: Secondary | ICD-10-CM

## 2020-06-28 DIAGNOSIS — M160 Bilateral primary osteoarthritis of hip: Secondary | ICD-10-CM

## 2020-06-28 MED ORDER — HYDROCODONE-ACETAMINOPHEN 10-325 MG PO TABS: 1.0000 | ORAL_TABLET | Freq: Four times a day (QID) | ORAL | 0 refills | Status: DC | PRN

## 2020-07-28 ENCOUNTER — Other Ambulatory Visit: Payer: Self-pay

## 2020-07-28 DIAGNOSIS — G5731 Lesion of lateral popliteal nerve, right lower limb: Secondary | ICD-10-CM

## 2020-07-28 DIAGNOSIS — M160 Bilateral primary osteoarthritis of hip: Secondary | ICD-10-CM

## 2020-07-28 DIAGNOSIS — M48062 Spinal stenosis, lumbar region with neurogenic claudication: Secondary | ICD-10-CM

## 2020-07-28 DIAGNOSIS — M159 Polyosteoarthritis, unspecified: Secondary | ICD-10-CM

## 2020-07-28 DIAGNOSIS — M170 Bilateral primary osteoarthritis of knee: Secondary | ICD-10-CM

## 2020-07-28 DIAGNOSIS — M4804 Spinal stenosis, thoracic region: Secondary | ICD-10-CM

## 2020-07-28 DIAGNOSIS — G8929 Other chronic pain: Secondary | ICD-10-CM

## 2020-07-28 DIAGNOSIS — G959 Disease of spinal cord, unspecified: Secondary | ICD-10-CM

## 2020-07-28 DIAGNOSIS — M150 Primary generalized (osteo)arthritis: Secondary | ICD-10-CM

## 2020-07-28 DIAGNOSIS — M5441 Lumbago with sciatica, right side: Secondary | ICD-10-CM

## 2020-07-28 DIAGNOSIS — M47812 Spondylosis without myelopathy or radiculopathy, cervical region: Secondary | ICD-10-CM

## 2020-07-28 DIAGNOSIS — G629 Polyneuropathy, unspecified: Secondary | ICD-10-CM

## 2020-07-28 MED ORDER — HYDROCODONE-ACETAMINOPHEN 10-325 MG PO TABS: 1.0000 | ORAL_TABLET | Freq: Four times a day (QID) | ORAL | 0 refills | Status: DC | PRN

## 2020-07-29 DIAGNOSIS — R339 Retention of urine, unspecified: Secondary | ICD-10-CM | POA: Diagnosis not present

## 2020-08-25 ENCOUNTER — Telehealth: Payer: Self-pay | Admitting: Vascular Surgery

## 2020-08-29 ENCOUNTER — Other Ambulatory Visit: Payer: Self-pay

## 2020-08-29 DIAGNOSIS — G959 Disease of spinal cord, unspecified: Secondary | ICD-10-CM

## 2020-08-29 DIAGNOSIS — M48062 Spinal stenosis, lumbar region with neurogenic claudication: Secondary | ICD-10-CM

## 2020-08-29 DIAGNOSIS — M159 Polyosteoarthritis, unspecified: Secondary | ICD-10-CM

## 2020-08-29 DIAGNOSIS — G5731 Lesion of lateral popliteal nerve, right lower limb: Secondary | ICD-10-CM

## 2020-08-29 DIAGNOSIS — M160 Bilateral primary osteoarthritis of hip: Secondary | ICD-10-CM

## 2020-08-29 DIAGNOSIS — M4804 Spinal stenosis, thoracic region: Secondary | ICD-10-CM

## 2020-08-29 DIAGNOSIS — M5441 Lumbago with sciatica, right side: Secondary | ICD-10-CM

## 2020-08-29 DIAGNOSIS — G629 Polyneuropathy, unspecified: Secondary | ICD-10-CM

## 2020-08-29 DIAGNOSIS — M170 Bilateral primary osteoarthritis of knee: Secondary | ICD-10-CM

## 2020-08-29 DIAGNOSIS — M47812 Spondylosis without myelopathy or radiculopathy, cervical region: Secondary | ICD-10-CM

## 2020-08-29 MED ORDER — HYDROCODONE-ACETAMINOPHEN 10-325 MG PO TABS: 1.0000 | ORAL_TABLET | Freq: Four times a day (QID) | ORAL | 0 refills | Status: DC | PRN

## 2020-09-06 ENCOUNTER — Encounter: Payer: Medicare Other | Admitting: Vascular Surgery

## 2020-09-26 ENCOUNTER — Other Ambulatory Visit: Payer: Self-pay

## 2020-09-26 DIAGNOSIS — G959 Disease of spinal cord, unspecified: Secondary | ICD-10-CM

## 2020-09-26 DIAGNOSIS — M160 Bilateral primary osteoarthritis of hip: Secondary | ICD-10-CM

## 2020-09-26 DIAGNOSIS — M170 Bilateral primary osteoarthritis of knee: Secondary | ICD-10-CM

## 2020-09-26 DIAGNOSIS — M4804 Spinal stenosis, thoracic region: Secondary | ICD-10-CM

## 2020-09-26 DIAGNOSIS — M5441 Lumbago with sciatica, right side: Secondary | ICD-10-CM

## 2020-09-26 DIAGNOSIS — G5731 Lesion of lateral popliteal nerve, right lower limb: Secondary | ICD-10-CM

## 2020-09-26 DIAGNOSIS — M48062 Spinal stenosis, lumbar region with neurogenic claudication: Secondary | ICD-10-CM

## 2020-09-26 DIAGNOSIS — G8929 Other chronic pain: Secondary | ICD-10-CM

## 2020-09-26 DIAGNOSIS — M47812 Spondylosis without myelopathy or radiculopathy, cervical region: Secondary | ICD-10-CM

## 2020-09-26 DIAGNOSIS — G629 Polyneuropathy, unspecified: Secondary | ICD-10-CM

## 2020-09-26 DIAGNOSIS — M159 Polyosteoarthritis, unspecified: Secondary | ICD-10-CM

## 2020-09-26 MED ORDER — HYDROCODONE-ACETAMINOPHEN 10-325 MG PO TABS: 1.0000 | ORAL_TABLET | Freq: Four times a day (QID) | ORAL | 0 refills | Status: DC | PRN

## 2020-09-29 ENCOUNTER — Other Ambulatory Visit: Payer: Self-pay

## 2020-09-29 ENCOUNTER — Encounter: Payer: Self-pay | Admitting: Family Medicine

## 2020-09-29 ENCOUNTER — Ambulatory Visit (INDEPENDENT_AMBULATORY_CARE_PROVIDER_SITE_OTHER): Payer: Medicare Other | Admitting: Family Medicine

## 2020-09-29 VITALS — BP 140/70 | HR 79 | Ht 59.0 in | Wt 154.0 lb

## 2020-09-29 DIAGNOSIS — N904 Leukoplakia of vulva: Secondary | ICD-10-CM

## 2020-09-29 DIAGNOSIS — Z23 Encounter for immunization: Secondary | ICD-10-CM

## 2020-09-29 DIAGNOSIS — M4804 Spinal stenosis, thoracic region: Secondary | ICD-10-CM | POA: Diagnosis not present

## 2020-09-29 DIAGNOSIS — I455 Other specified heart block: Secondary | ICD-10-CM

## 2020-09-29 DIAGNOSIS — M159 Polyosteoarthritis, unspecified: Secondary | ICD-10-CM

## 2020-09-29 DIAGNOSIS — G959 Disease of spinal cord, unspecified: Secondary | ICD-10-CM

## 2020-09-29 DIAGNOSIS — N1831 Chronic kidney disease, stage 3a: Secondary | ICD-10-CM

## 2020-09-29 DIAGNOSIS — I712 Thoracic aortic aneurysm, without rupture, unspecified: Secondary | ICD-10-CM

## 2020-09-29 DIAGNOSIS — N814 Uterovaginal prolapse, unspecified: Secondary | ICD-10-CM | POA: Diagnosis not present

## 2020-09-29 DIAGNOSIS — D500 Iron deficiency anemia secondary to blood loss (chronic): Secondary | ICD-10-CM | POA: Diagnosis not present

## 2020-09-29 DIAGNOSIS — Z79891 Long term (current) use of opiate analgesic: Secondary | ICD-10-CM

## 2020-09-29 DIAGNOSIS — M4715 Other spondylosis with myelopathy, thoracolumbar region: Secondary | ICD-10-CM

## 2020-09-29 DIAGNOSIS — Z79899 Other long term (current) drug therapy: Secondary | ICD-10-CM

## 2020-09-29 DIAGNOSIS — I10 Essential (primary) hypertension: Secondary | ICD-10-CM | POA: Diagnosis not present

## 2020-09-29 DIAGNOSIS — M160 Bilateral primary osteoarthritis of hip: Secondary | ICD-10-CM

## 2020-09-29 DIAGNOSIS — M8949 Other hypertrophic osteoarthropathy, multiple sites: Secondary | ICD-10-CM

## 2020-09-29 DIAGNOSIS — M5441 Lumbago with sciatica, right side: Secondary | ICD-10-CM | POA: Diagnosis not present

## 2020-09-29 DIAGNOSIS — G629 Polyneuropathy, unspecified: Secondary | ICD-10-CM

## 2020-09-29 DIAGNOSIS — G8929 Other chronic pain: Secondary | ICD-10-CM

## 2020-09-29 DIAGNOSIS — M170 Bilateral primary osteoarthritis of knee: Secondary | ICD-10-CM | POA: Diagnosis not present

## 2020-09-29 MED ORDER — LOSARTAN POTASSIUM 25 MG PO TABS: 25.0000 mg | ORAL_TABLET | Freq: Every day | ORAL | 3 refills | Status: DC

## 2020-09-29 NOTE — Patient Instructions (Addendum)
Please start a new medicine to decrease you blood pressure a little bit.  It is called Losartan.  It is a 25 mg tablet.  Please take it once a day.  You may take it with your other medications.   Please come by the [facility] around Thursday next week to have your blood work rechecked to make sure this new medication is not making you too dehydrated.

## 2020-09-30 LAB — BASIC METABOLIC PANEL
BUN/Creatinine Ratio: 33 — ABNORMAL HIGH (ref 12–28)
BUN: 35 mg/dL — ABNORMAL HIGH (ref 8–27)
CO2: 23 mmol/L (ref 20–29)
Calcium: 9.3 mg/dL (ref 8.7–10.3)
Chloride: 101 mmol/L (ref 96–106)
Creatinine, Ser: 1.06 mg/dL — ABNORMAL HIGH (ref 0.57–1.00)
GFR calc Af Amer: 55 mL/min/{1.73_m2} — ABNORMAL LOW (ref >59)
GFR calc non Af Amer: 48 mL/min/{1.73_m2} — ABNORMAL LOW (ref >59)
Glucose: 94 mg/dL (ref 65–99)
Potassium: 3.9 mmol/L (ref 3.5–5.2)
Sodium: 141 mmol/L (ref 134–144)

## 2020-09-30 LAB — CBC
Hematocrit: 33.7 % — ABNORMAL LOW (ref 34.0–46.6)
Hemoglobin: 11.1 g/dL (ref 11.1–15.9)
MCH: 28.2 pg (ref 26.6–33.0)
MCHC: 32.9 g/dL (ref 31.5–35.7)
MCV: 86 fL (ref 79–97)
Platelets: 206 10*3/uL (ref 150–450)
RBC: 3.93 x10E6/uL (ref 3.77–5.28)
RDW: 13.4 % (ref 11.7–15.4)
WBC: 3.8 10*3/uL (ref 3.4–10.8)

## 2020-09-30 LAB — FERRITIN: Ferritin: 228 ng/mL — ABNORMAL HIGH (ref 15–150)

## 2020-10-03 ENCOUNTER — Encounter: Payer: Self-pay | Admitting: Family Medicine

## 2020-10-03 NOTE — Assessment & Plan Note (Addendum)
Established problem. Stable. No chest or thoracic back pains.  Explained nature of the aneurysm to Nicole Perez. She is scheduled to see [facility] in November.   Nicole Perez had a hospital admission in 2018 with symptomatic bradycardia with junctional rhythm in 30s BPM that resolved with stopping home Diltiazem (HTN).  I am hesitant to start a beta-blocker given this history.   Will start Losartan at low dose (25 mg daily) with goal of getting BP < 130/80 without significant adverse effects. She is resistant to starting a statin given her history of low LDL serum levels (~ 60 last check).

## 2020-10-03 NOTE — Progress Notes (Signed)
Nicole Perez is accompanied by daughter Sources of clinical information for visit is/are patient and relative(s). Nursing assessment for this office visit was reviewed with the patient for accuracy and revision.     Previous Report(s) Reviewed: lab reports and office notes  Depression screen [facility] 2/9 09/29/2020  Decreased Interest 0  Down, Depressed, Hopeless 0  PHQ - 2 Score 0  Altered sleeping 2  Tired, decreased energy 3  Change in appetite 0  Feeling bad or failure about yourself  0  Trouble concentrating 0  Moving slowly or fidgety/restless 0  Suicidal thoughts 0  PHQ-9 Score 5  Difficult doing work/chores -  Some recent data might be hidden    Fall Risk  09/29/2020 05/31/2020 01/14/2020 12/28/2019 07/23/2019  Falls in the past year? 0 0 0 0 0  Number falls in past yr: - 0 - - 0  Injury with Fall? - 0 - - -  Risk Factor Category  - - - - -  Risk for fall due to : - - - - -  Risk for fall due to: Comment - - - - -  Follow up - - - - Falls evaluation completed    PHQ9 SCORE ONLY 09/29/2020 05/31/2020 01/14/2020  PHQ-9 Total Score 5 6 0    Adult vaccines due  Topic Date Due  . TETANUS/TDAP  07/10/2020    Health Maintenance Due  Topic Date Due  . TETANUS/TDAP  07/10/2020      History/P.E. limitations: communication barrier hearing impairment.  Nicole Perez was wearing a hearing aid in her left ear.  Adult vaccines due  Topic Date Due  . TETANUS/TDAP  07/10/2020   There are no preventive care reminders to display for this patient.  Health Maintenance Due  Topic Date Due  . Samul Dada  07/10/2020     Chief Complaint  Patient presents with  . Hip Pain    right   . Skin Problem

## 2020-10-03 NOTE — Assessment & Plan Note (Signed)
Established problem. No reported signs of GI bleeding.  Stable Hemoglobin. Periodic monitoring, prn symptoms

## 2020-10-03 NOTE — Assessment & Plan Note (Signed)
Established problem Uncontrolled Given Thoracic AA, goal < 130/80 initially Start: Losartan 25 mg daily (SCr 1.1) Continue: HCTZ 25 mg daily. 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

## 2020-10-03 NOTE — Assessment & Plan Note (Signed)
Established problem Not well controlled except with use of the Norco tablets.  She has seen practioners she has seen on TV advertizing to treat painful peripheral neuropathy.  They wanted over $8000 up front and treatment seemed to include supplements, light therapy, and peripheral nerve injections with marcaine.   Continue current gabapentin 100 mg tid, Norco qid prn. I did recommend trial of Alpha-Lipoic Acid (OTC). Future options include topical voltaren, topical lidocaine gel or patches.

## 2020-10-03 NOTE — Assessment & Plan Note (Signed)
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

## 2020-10-03 NOTE — Assessment & Plan Note (Signed)
Established problem Controlled Continue Norco 10/325 bid prn Opiate therapy helps patient continue ADLs without significant adverse effects or abberrant behaviors.  PDMP reviewed - no evidence doctor shopping.

## 2020-10-03 NOTE — Assessment & Plan Note (Signed)
Established problem The Norco tablets help with peripheral pain enough to allow her to perform her ADLs independently.

## 2020-10-03 NOTE — Assessment & Plan Note (Signed)
Established problem Unchanged. Nicole Perez feels the cystocele at the introitus

## 2020-10-14 ENCOUNTER — Other Ambulatory Visit: Payer: Self-pay | Admitting: Family Medicine

## 2020-10-14 DIAGNOSIS — N3020 Other chronic cystitis without hematuria: Secondary | ICD-10-CM

## 2020-10-21 ENCOUNTER — Encounter: Payer: Medicare Other | Admitting: Vascular Surgery

## 2020-10-24 ENCOUNTER — Other Ambulatory Visit: Payer: Self-pay

## 2020-10-24 DIAGNOSIS — M4804 Spinal stenosis, thoracic region: Secondary | ICD-10-CM

## 2020-10-24 DIAGNOSIS — M170 Bilateral primary osteoarthritis of knee: Secondary | ICD-10-CM

## 2020-10-24 DIAGNOSIS — M159 Polyosteoarthritis, unspecified: Secondary | ICD-10-CM

## 2020-10-24 DIAGNOSIS — G8929 Other chronic pain: Secondary | ICD-10-CM

## 2020-10-24 DIAGNOSIS — M5441 Lumbago with sciatica, right side: Secondary | ICD-10-CM

## 2020-10-24 DIAGNOSIS — M48062 Spinal stenosis, lumbar region with neurogenic claudication: Secondary | ICD-10-CM

## 2020-10-24 DIAGNOSIS — M8949 Other hypertrophic osteoarthropathy, multiple sites: Secondary | ICD-10-CM

## 2020-10-24 DIAGNOSIS — M160 Bilateral primary osteoarthritis of hip: Secondary | ICD-10-CM

## 2020-10-24 DIAGNOSIS — M47812 Spondylosis without myelopathy or radiculopathy, cervical region: Secondary | ICD-10-CM

## 2020-10-24 DIAGNOSIS — G5731 Lesion of lateral popliteal nerve, right lower limb: Secondary | ICD-10-CM

## 2020-10-24 DIAGNOSIS — G629 Polyneuropathy, unspecified: Secondary | ICD-10-CM

## 2020-10-24 DIAGNOSIS — G959 Disease of spinal cord, unspecified: Secondary | ICD-10-CM

## 2020-10-24 MED ORDER — HYDROCODONE-ACETAMINOPHEN 10-325 MG PO TABS: 1.0000 | ORAL_TABLET | Freq: Four times a day (QID) | ORAL | 0 refills | Status: DC | PRN

## 2020-11-23 ENCOUNTER — Other Ambulatory Visit: Payer: Self-pay

## 2020-11-23 DIAGNOSIS — M160 Bilateral primary osteoarthritis of hip: Secondary | ICD-10-CM

## 2020-11-23 DIAGNOSIS — M48062 Spinal stenosis, lumbar region with neurogenic claudication: Secondary | ICD-10-CM

## 2020-11-23 DIAGNOSIS — M159 Polyosteoarthritis, unspecified: Secondary | ICD-10-CM

## 2020-11-23 DIAGNOSIS — G8929 Other chronic pain: Secondary | ICD-10-CM

## 2020-11-23 DIAGNOSIS — G629 Polyneuropathy, unspecified: Secondary | ICD-10-CM

## 2020-11-23 DIAGNOSIS — M47812 Spondylosis without myelopathy or radiculopathy, cervical region: Secondary | ICD-10-CM

## 2020-11-23 DIAGNOSIS — M170 Bilateral primary osteoarthritis of knee: Secondary | ICD-10-CM

## 2020-11-23 DIAGNOSIS — G959 Disease of spinal cord, unspecified: Secondary | ICD-10-CM

## 2020-11-23 DIAGNOSIS — M4804 Spinal stenosis, thoracic region: Secondary | ICD-10-CM

## 2020-11-23 DIAGNOSIS — G5731 Lesion of lateral popliteal nerve, right lower limb: Secondary | ICD-10-CM

## 2020-11-23 DIAGNOSIS — M8949 Other hypertrophic osteoarthropathy, multiple sites: Secondary | ICD-10-CM

## 2020-11-24 MED ORDER — HYDROCODONE-ACETAMINOPHEN 10-325 MG PO TABS: 1.0000 | ORAL_TABLET | Freq: Four times a day (QID) | ORAL | 0 refills | Status: DC | PRN

## 2020-12-02 DIAGNOSIS — R339 Retention of urine, unspecified: Secondary | ICD-10-CM | POA: Diagnosis not present

## 2020-12-04 ENCOUNTER — Encounter (HOSPITAL_COMMUNITY): Payer: Self-pay | Admitting: Emergency Medicine

## 2020-12-04 ENCOUNTER — Emergency Department (HOSPITAL_COMMUNITY): Payer: Medicare Other

## 2020-12-04 ENCOUNTER — Other Ambulatory Visit: Payer: Self-pay

## 2020-12-04 ENCOUNTER — Ambulatory Visit (HOSPITAL_COMMUNITY)
Admission: EM | Admit: 2020-12-04 | Discharge: 2020-12-04 | Disposition: A | Discharge status: Home / Self Care | Payer: Medicare Other

## 2020-12-04 ENCOUNTER — Encounter (HOSPITAL_COMMUNITY): Payer: Self-pay

## 2020-12-04 ENCOUNTER — Emergency Department (HOSPITAL_COMMUNITY)
Admission: EM | Admit: 2020-12-04 | Discharge: 2020-12-04 | Disposition: A | Discharge status: Home / Self Care | Payer: Medicare Other | Attending: Emergency Medicine | Admitting: Emergency Medicine

## 2020-12-04 DIAGNOSIS — N183 Chronic kidney disease, stage 3 unspecified: Secondary | ICD-10-CM | POA: Insufficient documentation

## 2020-12-04 DIAGNOSIS — I129 Hypertensive chronic kidney disease with stage 1 through stage 4 chronic kidney disease, or unspecified chronic kidney disease: Secondary | ICD-10-CM | POA: Insufficient documentation

## 2020-12-04 DIAGNOSIS — B029 Zoster without complications: Secondary | ICD-10-CM | POA: Diagnosis not present

## 2020-12-04 DIAGNOSIS — K5790 Diverticulosis of intestine, part unspecified, without perforation or abscess without bleeding: Secondary | ICD-10-CM | POA: Diagnosis not present

## 2020-12-04 DIAGNOSIS — K8020 Calculus of gallbladder without cholecystitis without obstruction: Secondary | ICD-10-CM | POA: Diagnosis not present

## 2020-12-04 DIAGNOSIS — R109 Unspecified abdominal pain: Secondary | ICD-10-CM

## 2020-12-04 DIAGNOSIS — K429 Umbilical hernia without obstruction or gangrene: Secondary | ICD-10-CM | POA: Diagnosis not present

## 2020-12-04 DIAGNOSIS — Z79899 Other long term (current) drug therapy: Secondary | ICD-10-CM | POA: Diagnosis not present

## 2020-12-04 DIAGNOSIS — Z87891 Personal history of nicotine dependence: Secondary | ICD-10-CM | POA: Diagnosis not present

## 2020-12-04 DIAGNOSIS — M4316 Spondylolisthesis, lumbar region: Secondary | ICD-10-CM | POA: Diagnosis not present

## 2020-12-04 LAB — URINALYSIS, ROUTINE W REFLEX MICROSCOPIC
Bilirubin Urine: NEGATIVE
Glucose, UA: NEGATIVE mg/dL
Hgb urine dipstick: NEGATIVE
Ketones, ur: NEGATIVE mg/dL
Leukocytes,Ua: NEGATIVE
Nitrite: NEGATIVE
Protein, ur: NEGATIVE mg/dL
Specific Gravity, Urine: 1.013 (ref 1.005–1.030)
pH: 7 (ref 5.0–8.0)

## 2020-12-04 LAB — CBC
HCT: 35 % — ABNORMAL LOW (ref 36.0–46.0)
Hemoglobin: 11.2 g/dL — ABNORMAL LOW (ref 12.0–15.0)
MCH: 27.7 pg (ref 26.0–34.0)
MCHC: 32 g/dL (ref 30.0–36.0)
MCV: 86.4 fL (ref 80.0–100.0)
Platelets: 214 10*3/uL (ref 150–400)
RBC: 4.05 MIL/uL (ref 3.87–5.11)
RDW: 13.2 % (ref 11.5–15.5)
WBC: 4.6 10*3/uL (ref 4.0–10.5)
nRBC: 0 % (ref 0.0–0.2)

## 2020-12-04 LAB — BASIC METABOLIC PANEL
Anion gap: 15 (ref 5–15)
BUN: 25 mg/dL — ABNORMAL HIGH (ref 8–23)
CO2: 25 mmol/L (ref 22–32)
Calcium: 9.8 mg/dL (ref 8.9–10.3)
Chloride: 98 mmol/L (ref 98–111)
Creatinine, Ser: 1.01 mg/dL — ABNORMAL HIGH (ref 0.44–1.00)
GFR, Estimated: 55 mL/min — ABNORMAL LOW (ref >60)
Glucose, Bld: 138 mg/dL — ABNORMAL HIGH (ref 70–99)
Potassium: 3.1 mmol/L — ABNORMAL LOW (ref 3.5–5.1)
Sodium: 138 mmol/L (ref 135–145)

## 2020-12-04 MED ORDER — OXYCODONE-ACETAMINOPHEN 5-325 MG PO TABS: 1.0000 | ORAL_TABLET | Freq: Four times a day (QID) | ORAL | 0 refills | Status: DC | PRN

## 2020-12-04 MED ORDER — VALACYCLOVIR HCL 1 G PO TABS: 1000.0000 mg | ORAL_TABLET | Freq: Three times a day (TID) | ORAL | 0 refills | Status: AC

## 2020-12-04 MED ORDER — ONDANSETRON HCL 4 MG/2ML IJ SOLN
4.0000 mg | Freq: Once | INTRAMUSCULAR | Status: AC
  Administered 2020-12-04: 15:00:00 4 mg via INTRAVENOUS
  Filled 2020-12-04: qty 2

## 2020-12-04 MED ORDER — SODIUM CHLORIDE 0.9 % IV BOLUS
500.0000 mL | Freq: Once | INTRAVENOUS | Status: AC
  Administered 2020-12-04: 15:00:00 500 mL via INTRAVENOUS

## 2020-12-04 MED ORDER — POLYETHYLENE GLYCOL 3350 17 G PO PACK: 17.0000 g | PACK | Freq: Every day | ORAL | 0 refills | Status: DC

## 2020-12-04 MED ORDER — OXYCODONE-ACETAMINOPHEN 5-325 MG PO TABS
1.0000 | ORAL_TABLET | Freq: Once | ORAL | Status: AC
  Administered 2020-12-04: 16:00:00 1 via ORAL
  Filled 2020-12-04: qty 1

## 2020-12-04 MED ORDER — DOCUSATE SODIUM 100 MG PO CAPS: 100.0000 mg | ORAL_CAPSULE | Freq: Two times a day (BID) | ORAL | 0 refills | Status: DC

## 2020-12-04 MED ORDER — MORPHINE SULFATE (PF) 4 MG/ML IV SOLN
4.0000 mg | Freq: Once | INTRAVENOUS | Status: AC
  Administered 2020-12-04: 4 mg via INTRAVENOUS
  Filled 2020-12-04: qty 1

## 2020-12-04 NOTE — ED Notes (Signed)
Nicole Perez (Daughter#[phone]) called/would like a call once patient is in a room. Waiting outside of ED to visit.

## 2020-12-04 NOTE — Discharge Instructions (Addendum)
-

## 2020-12-04 NOTE — ED Triage Notes (Signed)
PT referred to ED for hypertensive event. By PA

## 2020-12-04 NOTE — ED Provider Notes (Signed)
CT scan without acute findings.  Patient has been fairly well pain controlled with morphine.  She continues to identify her pain is a very focal point on the right lower flank region.  She has no reproducible abdominal pain.  The abdomen is soft without guarding.  Flank exam is normal without lymphadenopathy or mass.  Femoral pulses are normal.  There is a patch of erythema on the right flank. Physical Exam  BP (!) 191/88   Pulse 66   Temp 97.7 F (36.5 C) (Oral)   Resp (!) 22   LMP 12/17/1962   SpO2 95%   Physical Exam    ED Course/Procedures     Procedures  MDM  No signs of kidney stone.  No signs of vascular emergency.  Patient has a benign abdominal exam and no radiation of pain from a focus clearly on the flank.  Will provide Percocet for pain.  I reviewed extensive the patient's daughter and patient using Colace twice a day and MiraLAX daily to avoid constipation.  We reviewed patient's blood pressure and plan for monitoring at home and close follow-up with PCP.  No signs of endorgan damage from hypertension today.       Charlesetta Shanks, MD 12/04/20 1626
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
## 2020-12-04 NOTE — ED Notes (Signed)
Patient is being discharged from the Urgent Care and sent to the Emergency Department via [facility] Roof, [place] . Per POV, patient is in need of higher level of care due to hypertensive emergency. Patient is aware and verbalizes understanding of plan of care.  Vitals:   12/04/20 1131 12/04/20 1138  BP: (!) 183/138   Pulse:    Resp: 12   Temp:  98.1 F (36.7 C)  SpO2:
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
## 2020-12-04 NOTE — ED Triage Notes (Signed)
Pt presents with Flank pain x last night. Pt daughter states she has been constipated x 2 days. Pt states she has not had fever or trouble urinating.Pt states it is a sharp pain. Pt denies headaches and dizziness.

## 2020-12-04 NOTE — ED Triage Notes (Addendum)
Pt reports R flank pain since last night that woke her up.  Denies urinary complaint.  States she has to self-cath.  Also reports chills.  Went to [facility] first and sent to ED due to HTN.

## 2020-12-04 NOTE — ED Notes (Signed)
Pt discharged via wheelchair. All questions and concerns addressed. No complaints at this time.  ° °

## 2020-12-04 NOTE — Discharge Instructions (Addendum)
1.  While you are taking Percocet for pain, you need to take a stool softener and a laxative to avoid constipation.  Take Colace twice a day.  Take MiraLAX once daily.  If your stools are becoming too soft or liquidy, decrease the frequency of the MiraLAX. 2.  You have what appears to be early shingles.  You have been prescribed Val acyclovir.  Start this medication today.  Continue to observe for changes in the rash.  Shingles can developed a very large rash with a lot of blisters and scabs.  This is typical.  The rash however typically stays in a large patch or band on the same side of the body. 3.  Return if you develop a fever, abdominal pain, weakness, numbness of the leg or other concerning symptoms. 4.  Your blood pressure was very elevated in the emergency department.  Continue to take your blood pressure medication, losartan.  You are taking 25 mg a day.  If your blood pressure remains elevated after your pain is controlled, you may increase your dose to 50 mg a day.  Contact your doctor Monday to schedule a follow-up recheck and monitoring of your blood pressure and your rash.

## 2020-12-06 ENCOUNTER — Ambulatory Visit (INDEPENDENT_AMBULATORY_CARE_PROVIDER_SITE_OTHER): Payer: Medicare Other | Admitting: Family Medicine

## 2020-12-06 ENCOUNTER — Other Ambulatory Visit: Payer: Self-pay

## 2020-12-06 ENCOUNTER — Encounter: Payer: Self-pay | Admitting: Family Medicine

## 2020-12-06 VITALS — BP 158/82 | HR 72 | Wt 155.0 lb

## 2020-12-06 DIAGNOSIS — B029 Zoster without complications: Secondary | ICD-10-CM | POA: Diagnosis not present

## 2020-12-06 DIAGNOSIS — B0223 Postherpetic polyneuropathy: Secondary | ICD-10-CM

## 2020-12-06 LAB — URINE CULTURE: Culture: >=100000 — AB

## 2020-12-06 MED ORDER — OXYCODONE-ACETAMINOPHEN 5-325 MG PO TABS
2.0000 | ORAL_TABLET | ORAL | 0 refills | Status: AC | PRN
Start: 2020-12-06 — End: 2020-12-11

## 2020-12-06 MED ORDER — CAPSAICIN 0.075 % EX CREA: TOPICAL_CREAM | Freq: Two times a day (BID) | CUTANEOUS | Status: DC

## 2020-12-06 NOTE — Assessment & Plan Note (Signed)
Likely diagnosis given sharp unilateral sudden onset pain in back that does not radiate, faint rash present though no obvious vesicular pattern.  CT negative for Pyelonephritis, Kidney stones or other abdominal abnormality noted.  No bony tenderness or tendeness to palpation or preceding trauma or inciting event making musculoskeletal cause for pain less likely.  Patient gets frequent UTI, but indicates this pain much different. - Take Valcyclovir 3 times as instructed - Prescribed Capsaicin cream to apply topically - Can take 2 Percocet 5-325 every 6 hours.  Patient likely to have higher tolerance given opioid dose.  Encouraged to decrease dose if having weakness or other issues.  Daughter also staying with patient and encouraged to watch patient closely as she will be at greater risk of falls.  Daughter agreed to this - Prescribe 5 day Oxycodone supply in conjunction with ED prescription to last patient for 7-10 days

## 2020-12-06 NOTE — Patient Instructions (Signed)
It was good to see you today.  Thank you for coming in.  I think you have Shingles.   You should be better in 1-2 weeks.  Continue to take Valcyclovir 3 times a day.  I will prescribe you Oxycodone 5 mg.  Continue to take 2 of these every 6 hours as needed for the pain.  Be very careful walking and I am telling daughter to supervise very closely when you are walking.  If you are not better by 2 weeks then come back to see [place].  Be Well, Dr Manus Rudd

## 2020-12-06 NOTE — Progress Notes (Signed)
    SUBJECTIVE:   CHIEF COMPLAINT / HPI: Back Pain  Nicole Perez is here following going to ED for severe back pain.  Indicates woke up with pain on Sunday morning.  Rates pain as 10 out of 10.  Describes pain as sharp, knife-like "like someone cutting into my back.  Indicates the pain does not radiate anywhere.  Does not recall any trauma to the area.  In ED was diagnosed as likely Zoster.  Prescribed Valcyclovir and Percocet 5-325.  Indicates has been taking spordically and only minimally helping with pain.    Patient has Osteoarthritis for which she has taken Hydrocodone 10 mg for a long time.  Patient has frequent UTI's but gets burning with urination with these and indicates this pain is much different.      PERTINENT  PMH / PSH: Frequent UTI, AAA, Osteoarthritis of lumbar spine, Peripheral neuropathy   OBJECTIVE:   BP (!) 158/82   Pulse 72   Wt 155 lb (70.3 kg)   LMP 12/17/1962   SpO2 97%   BMI 31.31 kg/m    Physical Exam Constitutional:      General: She is not in acute distress.    Appearance: She is not ill-appearing.  Abdominal:     General: Abdomen is flat. There is no distension.     Palpations: Abdomen is soft.     Tenderness: There is no abdominal tenderness. There is no right CVA tenderness.  Musculoskeletal:     Lumbar back: No deformity, tenderness or bony tenderness.  Skin:    General: Skin is warm.     Findings: Rash present.     Comments: Faint rash in dermatomal pattern on lower right back  Neurological:     Mental Status: She is alert.     ASSESSMENT/PLAN:   Zoster Likely diagnosis given sharp unilateral sudden onset pain in back that does not radiate, faint rash present though no obvious vesicular pattern.  CT negative for Pyelonephritis, Kidney stones or other abdominal abnormality noted.  No bony tenderness or tendeness to palpation or preceding trauma or inciting event making musculoskeletal cause for pain less likely.  Patient gets frequent UTI,  but indicates this pain much different. - Take Valcyclovir 3 times as instructed - Prescribed Capsaicin cream to apply topically - Can take 2 Percocet 5-325 every 6 hours.  Patient likely to have higher tolerance given opioid dose.  Encouraged to decrease dose if having weakness or other issues.  Daughter also staying with patient and encouraged to watch patient closely as she will be at greater risk of falls.  Daughter agreed to this - Prescribe 5 day Oxycodone supply in conjunction with ED prescription to last patient for 7-10 days     Delora Fuel, MD [place]

## 2020-12-07 ENCOUNTER — Telehealth: Payer: Self-pay

## 2020-12-07 NOTE — Telephone Encounter (Signed)
No treatment for UC ED 12/04/20 per  Delia Heady PA

## 2020-12-07 NOTE — Telephone Encounter (Signed)
Daughter calls nurse line reporting they have not been able to pick up Capsicum cream at pharmacy. Medication was sent in yesterday, however was set as clinic administered. I called the pharmacy and gave a verbal and updated [name].

## 2020-12-12 ENCOUNTER — Other Ambulatory Visit: Payer: Self-pay

## 2020-12-12 MED ORDER — OXYCODONE-ACETAMINOPHEN 5-325 MG PO TABS: 1.0000 | ORAL_TABLET | Freq: Four times a day (QID) | ORAL | 0 refills | Status: DC | PRN

## 2020-12-12 NOTE — Telephone Encounter (Signed)
Patient's daughter calls nurse line regarding continued pain from shingles. Daughter reports that patient is currently out of prescribed percocet and would like a refill.   Forwarding to PCP  Please advise.   Veronda Prude, RN

## 2020-12-19 ENCOUNTER — Other Ambulatory Visit: Payer: Self-pay | Admitting: Family Medicine

## 2020-12-19 DIAGNOSIS — M150 Primary generalized (osteo)arthritis: Secondary | ICD-10-CM

## 2020-12-19 DIAGNOSIS — M8949 Other hypertrophic osteoarthropathy, multiple sites: Secondary | ICD-10-CM

## 2020-12-19 DIAGNOSIS — M47812 Spondylosis without myelopathy or radiculopathy, cervical region: Secondary | ICD-10-CM

## 2020-12-19 DIAGNOSIS — G5731 Lesion of lateral popliteal nerve, right lower limb: Secondary | ICD-10-CM

## 2020-12-19 DIAGNOSIS — M159 Polyosteoarthritis, unspecified: Secondary | ICD-10-CM

## 2020-12-19 DIAGNOSIS — M48062 Spinal stenosis, lumbar region with neurogenic claudication: Secondary | ICD-10-CM

## 2020-12-19 DIAGNOSIS — M170 Bilateral primary osteoarthritis of knee: Secondary | ICD-10-CM

## 2020-12-19 DIAGNOSIS — M160 Bilateral primary osteoarthritis of hip: Secondary | ICD-10-CM

## 2020-12-19 DIAGNOSIS — M4804 Spinal stenosis, thoracic region: Secondary | ICD-10-CM

## 2020-12-19 DIAGNOSIS — G629 Polyneuropathy, unspecified: Secondary | ICD-10-CM

## 2020-12-19 DIAGNOSIS — G959 Disease of spinal cord, unspecified: Secondary | ICD-10-CM

## 2020-12-19 DIAGNOSIS — G8929 Other chronic pain: Secondary | ICD-10-CM

## 2020-12-19 MED ORDER — HYDROCODONE-ACETAMINOPHEN 10-325 MG PO TABS: 1.0000 | ORAL_TABLET | Freq: Four times a day (QID) | ORAL | 0 refills | Status: DC | PRN

## 2020-12-19 NOTE — Telephone Encounter (Signed)
Sent in her Vicodin 10 /325 tablet #120 a few days early bc of dx of shingles

## 2020-12-19 NOTE — Telephone Encounter (Signed)
Nicole Perez calls nurse line requesting another percocet refill. Nicole Perez reports due to shingles her mother has been taking 2 tabs every 4-6 hours, therefore she is almost out of 12/27 prescription. Will forward to PCP to advise.

## 2021-01-23 ENCOUNTER — Other Ambulatory Visit: Payer: Self-pay

## 2021-01-23 DIAGNOSIS — G8929 Other chronic pain: Secondary | ICD-10-CM

## 2021-01-23 DIAGNOSIS — M8949 Other hypertrophic osteoarthropathy, multiple sites: Secondary | ICD-10-CM

## 2021-01-23 DIAGNOSIS — M170 Bilateral primary osteoarthritis of knee: Secondary | ICD-10-CM

## 2021-01-23 DIAGNOSIS — M159 Polyosteoarthritis, unspecified: Secondary | ICD-10-CM

## 2021-01-23 DIAGNOSIS — M47812 Spondylosis without myelopathy or radiculopathy, cervical region: Secondary | ICD-10-CM

## 2021-01-23 DIAGNOSIS — G629 Polyneuropathy, unspecified: Secondary | ICD-10-CM

## 2021-01-23 DIAGNOSIS — G5731 Lesion of lateral popliteal nerve, right lower limb: Secondary | ICD-10-CM

## 2021-01-23 DIAGNOSIS — M160 Bilateral primary osteoarthritis of hip: Secondary | ICD-10-CM

## 2021-01-23 DIAGNOSIS — G959 Disease of spinal cord, unspecified: Secondary | ICD-10-CM

## 2021-01-23 DIAGNOSIS — M48062 Spinal stenosis, lumbar region with neurogenic claudication: Secondary | ICD-10-CM

## 2021-01-23 DIAGNOSIS — M4804 Spinal stenosis, thoracic region: Secondary | ICD-10-CM

## 2021-01-23 MED ORDER — HYDROCODONE-ACETAMINOPHEN 10-325 MG PO TABS: 1.0000 | ORAL_TABLET | Freq: Four times a day (QID) | ORAL | 0 refills | Status: DC | PRN

## 2021-02-10 DIAGNOSIS — M25551 Pain in right hip: Secondary | ICD-10-CM | POA: Diagnosis not present

## 2021-02-21 ENCOUNTER — Other Ambulatory Visit: Payer: Self-pay | Admitting: Family Medicine

## 2021-02-21 ENCOUNTER — Telehealth: Payer: Self-pay

## 2021-02-21 DIAGNOSIS — G5731 Lesion of lateral popliteal nerve, right lower limb: Secondary | ICD-10-CM

## 2021-02-21 DIAGNOSIS — G629 Polyneuropathy, unspecified: Secondary | ICD-10-CM

## 2021-02-21 DIAGNOSIS — M170 Bilateral primary osteoarthritis of knee: Secondary | ICD-10-CM

## 2021-02-21 DIAGNOSIS — M47812 Spondylosis without myelopathy or radiculopathy, cervical region: Secondary | ICD-10-CM

## 2021-02-21 DIAGNOSIS — M4804 Spinal stenosis, thoracic region: Secondary | ICD-10-CM

## 2021-02-21 DIAGNOSIS — G8929 Other chronic pain: Secondary | ICD-10-CM

## 2021-02-21 DIAGNOSIS — M159 Polyosteoarthritis, unspecified: Secondary | ICD-10-CM

## 2021-02-21 DIAGNOSIS — M48062 Spinal stenosis, lumbar region with neurogenic claudication: Secondary | ICD-10-CM

## 2021-02-21 DIAGNOSIS — M8949 Other hypertrophic osteoarthropathy, multiple sites: Secondary | ICD-10-CM

## 2021-02-21 DIAGNOSIS — M160 Bilateral primary osteoarthritis of hip: Secondary | ICD-10-CM

## 2021-02-21 DIAGNOSIS — G959 Disease of spinal cord, unspecified: Secondary | ICD-10-CM

## 2021-02-21 NOTE — Telephone Encounter (Signed)
Patients daughter is coming in to request mothers pain medications. Please advise. Thanks!

## 2021-02-21 NOTE — Telephone Encounter (Signed)
Patient calls nurse line stating she has been seeing Dr. Percell Miller for her hip pain.  Dr. Percell Miller believes she would benefit from [facility] PT, however he can not place the [facility] order, patients PCP needs to be the one to do this. Patients last [facility] PT referral was placed over one year ago. I will forward to PCP to advise.

## 2021-02-22 ENCOUNTER — Other Ambulatory Visit: Payer: Self-pay | Admitting: Family Medicine

## 2021-02-22 DIAGNOSIS — M4715 Other spondylosis with myelopathy, thoracolumbar region: Secondary | ICD-10-CM

## 2021-02-22 DIAGNOSIS — M8949 Other hypertrophic osteoarthropathy, multiple sites: Secondary | ICD-10-CM

## 2021-02-22 DIAGNOSIS — M47812 Spondylosis without myelopathy or radiculopathy, cervical region: Secondary | ICD-10-CM

## 2021-02-22 DIAGNOSIS — M160 Bilateral primary osteoarthritis of hip: Secondary | ICD-10-CM

## 2021-02-22 DIAGNOSIS — M159 Polyosteoarthritis, unspecified: Secondary | ICD-10-CM

## 2021-02-22 DIAGNOSIS — M170 Bilateral primary osteoarthritis of knee: Secondary | ICD-10-CM

## 2021-02-22 DIAGNOSIS — G8929 Other chronic pain: Secondary | ICD-10-CM

## 2021-02-22 MED ORDER — HYDROCODONE-ACETAMINOPHEN 10-325 MG PO TABS: 1.0000 | ORAL_TABLET | Freq: Four times a day (QID) | ORAL | 0 refills | Status: DC | PRN

## 2021-02-22 NOTE — Telephone Encounter (Signed)
Contacted pt home number and she did not answer. Contacted pt mobile number and her daughter picked up. I informed her that her mother home health PT and OT was ordered.

## 2021-02-22 NOTE — Progress Notes (Signed)
Ordewr for Home Health PT/OT placed for hip OA and multiple other sites with OA. Causing pain

## 2021-02-22 NOTE — Telephone Encounter (Signed)
Patient returns call to nurse line regarding pain medication refill. I have pended medication to this encounter.   Patient is also asking when she can receive a shingles vaccine. Patient was in the hospital on 12/04/20 with this diagnosis.   Please advise.   Talbot Grumbling, RN

## 2021-02-22 NOTE — Telephone Encounter (Signed)
Please let patient know that order was placed for Home Health PT and OT

## 2021-02-23 NOTE — Telephone Encounter (Signed)
noted 

## 2021-03-02 ENCOUNTER — Encounter: Payer: Self-pay | Admitting: Family Medicine

## 2021-03-02 ENCOUNTER — Other Ambulatory Visit: Payer: Self-pay

## 2021-03-02 ENCOUNTER — Ambulatory Visit (INDEPENDENT_AMBULATORY_CARE_PROVIDER_SITE_OTHER): Payer: Medicare Other | Admitting: Family Medicine

## 2021-03-02 VITALS — BP 147/62 | HR 93 | Ht 59.0 in | Wt 154.4 lb

## 2021-03-02 DIAGNOSIS — M160 Bilateral primary osteoarthritis of hip: Secondary | ICD-10-CM

## 2021-03-02 DIAGNOSIS — R413 Other amnesia: Secondary | ICD-10-CM

## 2021-03-02 DIAGNOSIS — M8949 Other hypertrophic osteoarthropathy, multiple sites: Secondary | ICD-10-CM | POA: Diagnosis not present

## 2021-03-02 DIAGNOSIS — E876 Hypokalemia: Secondary | ICD-10-CM

## 2021-03-02 DIAGNOSIS — R2689 Other abnormalities of gait and mobility: Secondary | ICD-10-CM

## 2021-03-02 DIAGNOSIS — M170 Bilateral primary osteoarthritis of knee: Secondary | ICD-10-CM | POA: Diagnosis not present

## 2021-03-02 DIAGNOSIS — M4715 Other spondylosis with myelopathy, thoracolumbar region: Secondary | ICD-10-CM | POA: Diagnosis not present

## 2021-03-02 DIAGNOSIS — Z23 Encounter for immunization: Secondary | ICD-10-CM | POA: Diagnosis not present

## 2021-03-02 DIAGNOSIS — G629 Polyneuropathy, unspecified: Secondary | ICD-10-CM | POA: Diagnosis not present

## 2021-03-02 DIAGNOSIS — M47812 Spondylosis without myelopathy or radiculopathy, cervical region: Secondary | ICD-10-CM

## 2021-03-02 DIAGNOSIS — M159 Polyosteoarthritis, unspecified: Secondary | ICD-10-CM

## 2021-03-02 DIAGNOSIS — D509 Iron deficiency anemia, unspecified: Secondary | ICD-10-CM | POA: Diagnosis not present

## 2021-03-02 DIAGNOSIS — G894 Chronic pain syndrome: Secondary | ICD-10-CM | POA: Diagnosis not present

## 2021-03-02 DIAGNOSIS — M4804 Spinal stenosis, thoracic region: Secondary | ICD-10-CM | POA: Diagnosis not present

## 2021-03-02 DIAGNOSIS — N3020 Other chronic cystitis without hematuria: Secondary | ICD-10-CM | POA: Diagnosis not present

## 2021-03-02 DIAGNOSIS — D500 Iron deficiency anemia secondary to blood loss (chronic): Secondary | ICD-10-CM

## 2021-03-02 DIAGNOSIS — M48062 Spinal stenosis, lumbar region with neurogenic claudication: Secondary | ICD-10-CM

## 2021-03-02 MED ORDER — SHINGRIX 50 MCG/0.5ML IM SUSR: 0.5000 mL | Freq: Once | INTRAMUSCULAR | 0 refills | Status: AC

## 2021-03-02 MED ORDER — DOXYCYCLINE HYCLATE 100 MG PO TABS: ORAL_TABLET | ORAL | 3 refills | Status: DC

## 2021-03-02 NOTE — Patient Instructions (Signed)
Your heart sounds good.   I requested that the [facility] to come out to your nhome to work on your balance and leg strength.    Due to healthcare laws, you may see the results of your imaging and laboratory studies on MyChart before your provider has had a chance to review them.   Your doctors understand that in some cases there may be results that are confusing or concerning to you.   Another cause of confusion is that all your laboratory results may not show up at the same time.   Your doctor may be waiting for multiple results to finally show up before your doctor can dicuss the results with you.     Please give [place] 72 hours in order for your doctor to thoroughly review all the results before contacting the office for clarification of your results.  If everything is normal, you will get a letter in the mail or a message in My Chart.  If there is a test or imaging study that is concerning to your doctor, they will call you to discuss them.   Please give [place] a call if you do not hear from [place] after 2 weeks.

## 2021-03-03 ENCOUNTER — Encounter: Payer: Self-pay | Admitting: Family Medicine

## 2021-03-03 DIAGNOSIS — R2689 Other abnormalities of gait and mobility: Secondary | ICD-10-CM | POA: Insufficient documentation

## 2021-03-03 DIAGNOSIS — R339 Retention of urine, unspecified: Secondary | ICD-10-CM | POA: Diagnosis not present

## 2021-03-03 LAB — CBC
Hematocrit: 32.5 % — ABNORMAL LOW (ref 34.0–46.6)
Hemoglobin: 11 g/dL — ABNORMAL LOW (ref 11.1–15.9)
MCH: 29.4 pg (ref 26.6–33.0)
MCHC: 33.8 g/dL (ref 31.5–35.7)
MCV: 87 fL (ref 79–97)
Platelets: 212 10*3/uL (ref 150–450)
RBC: 3.74 x10E6/uL — ABNORMAL LOW (ref 3.77–5.28)
RDW: 13.4 % (ref 11.7–15.4)
WBC: 4.9 10*3/uL (ref 3.4–10.8)

## 2021-03-03 LAB — BASIC METABOLIC PANEL
BUN/Creatinine Ratio: 22 (ref 12–28)
BUN: 23 mg/dL (ref 8–27)
CO2: 23 mmol/L (ref 20–29)
Calcium: 9.3 mg/dL (ref 8.7–10.3)
Chloride: 101 mmol/L (ref 96–106)
Creatinine, Ser: 1.05 mg/dL — ABNORMAL HIGH (ref 0.57–1.00)
Glucose: 110 mg/dL — ABNORMAL HIGH (ref 65–99)
Potassium: 4.2 mmol/L (ref 3.5–5.2)
Sodium: 139 mmol/L (ref 134–144)
eGFR: 52 mL/min/{1.73_m2} — ABNORMAL LOW (ref >59)

## 2021-03-03 NOTE — Assessment & Plan Note (Signed)
Established problem. Controlled Continue curretn analgesic therapy

## 2021-03-03 NOTE — Assessment & Plan Note (Signed)
CBC:    Component Value Date/Time   WBC 4.9 03/02/2021 1155   WBC 4.6 12/04/2020 1303   HGB 11.0 (L) 03/02/2021 1155   HCT 32.5 (L) 03/02/2021 1155   PLT 212 03/02/2021 1155   MCV 87 03/02/2021 1155   NEUTROABS 3.0 02/03/2019 1615   LYMPHSABS 1.8 02/03/2019 1615   MONOABS 0.3 04/09/2016 1646   EOSABS 0.2 02/03/2019 1615   BASOSABS 0.1 02/03/2019 1615   Stable Hemoglobin.  Normal MCV. No intervention needed. Continue periodic surveillance.

## 2021-03-03 NOTE — Progress Notes (Signed)
Nicole Perez is accompanied by her dgt, [name] Sources of clinical information for visit is/are patient and relative(s) and past office visit records Nursing assessment for this office visit was reviewed with the patient for accuracy and revision.     Previous Report(s) Reviewed: lab reports and office notes  Depression screen [facility] 2/9 03/02/2021  Decreased Interest 0  Down, Depressed, Hopeless 0  PHQ - 2 Score 0  Altered sleeping -  Tired, decreased energy 3  Change in appetite 0  Feeling bad or failure about yourself  0  Trouble concentrating 0  Moving slowly or fidgety/restless -  Suicidal thoughts 0  PHQ-9 Score -  Difficult doing work/chores Not difficult at all  Some recent data might be hidden    Fall Risk  03/02/2021 12/06/2020 09/29/2020 05/31/2020 01/14/2020  Falls in the past year? 0 0 0 0 0  Number falls in past yr: 0 - - 0 -  Injury with Fall? 0 - - 0 -  Risk Factor Category  - - - - -  Risk for fall due to : - - - - -  Risk for fall due to: Comment - - - - -  Follow up - - - - -    PHQ9 SCORE ONLY 03/02/2021 12/06/2020 09/29/2020  PHQ-9 Total Score 3 0 5    Adult vaccines due  Topic Date Due  . TETANUS/TDAP  07/10/2020    Health Maintenance Due  Topic Date Due  . TETANUS/TDAP  07/10/2020      History/P.E. limitations: communication barrier Hearing loss  Adult vaccines due  Topic Date Due  . TETANUS/TDAP  07/10/2020   There are no preventive care reminders to display for this patient.  Health Maintenance Due  Topic Date Due  . Samul Dada  07/10/2020     Chief Complaint  Patient presents with  . Referral    Homecare

## 2021-03-03 NOTE — Assessment & Plan Note (Signed)
Established problem Well Controlled. No signs of complications, medication side effects, or red flags. Continue current medications and other regiments.  

## 2021-03-03 NOTE — Assessment & Plan Note (Signed)
Established problem Intermittent symptoms for which pt self-initiates Doxycycline Refill for Doxy sent Pt advised to contact us if symptoms fail to improve within 3 days of Doxy start or worsen during the 3 days.

## 2021-03-03 NOTE — Assessment & Plan Note (Signed)
Established problem worsened.  Last [facility] pT was in April of last year.  Pt failed Chair sit-stand repetition test and semi-tandem stand balance test.  Slow gait observed using rolling walker.   Patient would benefit, as she did back in April, from home health PT.   Home health F2F completed and signed and referral made to [facility] agency for home PT.

## 2021-03-03 NOTE — Assessment & Plan Note (Signed)
Established problem. Stable. 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  Will continue hydrocodone-APAP 10/325, #120 tab per month Tolerating opioid thearpy.  Consitpation is adequately controlled with Miralax and dulcolax prn  PDMp review shows no evidenc of aberrant behaviors.

## 2021-03-13 ENCOUNTER — Telehealth: Payer: Self-pay

## 2021-03-13 NOTE — Telephone Encounter (Signed)
Patient calls nurse line requesting to speak with provider regarding lab results from 03/02/21.   Please advise.   Talbot Grumbling, RN

## 2021-03-14 ENCOUNTER — Encounter: Payer: Self-pay | Admitting: Family Medicine

## 2021-03-14 NOTE — Telephone Encounter (Signed)
Please let Nicole Perez know that her blood work looked good, including her hemoglobin of 11.0, her kidneys, electrolytes and blood sugar levels.

## 2021-03-15 NOTE — Telephone Encounter (Signed)
Patient calls back to nurse line returning call. Patient advised of lab results.

## 2021-03-15 NOTE — Telephone Encounter (Signed)
Attempted to reach pt. No answer LVM of note below. Salvatore Marvel, CMA

## 2021-03-23 ENCOUNTER — Other Ambulatory Visit: Payer: Self-pay

## 2021-03-23 DIAGNOSIS — M4804 Spinal stenosis, thoracic region: Secondary | ICD-10-CM

## 2021-03-23 DIAGNOSIS — M8949 Other hypertrophic osteoarthropathy, multiple sites: Secondary | ICD-10-CM

## 2021-03-23 DIAGNOSIS — M48062 Spinal stenosis, lumbar region with neurogenic claudication: Secondary | ICD-10-CM

## 2021-03-23 DIAGNOSIS — G8929 Other chronic pain: Secondary | ICD-10-CM

## 2021-03-23 DIAGNOSIS — G629 Polyneuropathy, unspecified: Secondary | ICD-10-CM

## 2021-03-23 DIAGNOSIS — M160 Bilateral primary osteoarthritis of hip: Secondary | ICD-10-CM

## 2021-03-23 DIAGNOSIS — G5731 Lesion of lateral popliteal nerve, right lower limb: Secondary | ICD-10-CM

## 2021-03-23 DIAGNOSIS — M170 Bilateral primary osteoarthritis of knee: Secondary | ICD-10-CM

## 2021-03-23 DIAGNOSIS — M47812 Spondylosis without myelopathy or radiculopathy, cervical region: Secondary | ICD-10-CM

## 2021-03-23 DIAGNOSIS — G959 Disease of spinal cord, unspecified: Secondary | ICD-10-CM

## 2021-03-23 DIAGNOSIS — M159 Polyosteoarthritis, unspecified: Secondary | ICD-10-CM

## 2021-03-23 DIAGNOSIS — M5441 Lumbago with sciatica, right side: Secondary | ICD-10-CM

## 2021-03-23 MED ORDER — HYDROCODONE-ACETAMINOPHEN 10-325 MG PO TABS: 1.0000 | ORAL_TABLET | Freq: Four times a day (QID) | ORAL | 0 refills | Status: DC | PRN

## 2021-04-13 ENCOUNTER — Telehealth: Payer: Self-pay

## 2021-04-13 NOTE — Telephone Encounter (Signed)
Patient's daughter calls nurse line requesting update on home health referral. Daughter reports that they have not heard from [place]. Daughter is now requesting that new referral be sent to [facility] for in home PT.   Will route to PCP and referral coordinator.   Betti Cruz Esaw Knippel

## 2021-04-14 NOTE — Telephone Encounter (Signed)
Received a message from Marisue Brooklyn with [facility] and they are starting PT/OT with patient today.  I will call the daughter this morning to confirm she is aware of this.  Jerid Catherman,CMA

## 2021-04-14 NOTE — Telephone Encounter (Signed)
Reviewed conversation string

## 2021-04-17 DIAGNOSIS — M48062 Spinal stenosis, lumbar region with neurogenic claudication: Secondary | ICD-10-CM | POA: Diagnosis not present

## 2021-04-17 DIAGNOSIS — M150 Primary generalized (osteo)arthritis: Secondary | ICD-10-CM | POA: Diagnosis not present

## 2021-04-17 DIAGNOSIS — M4804 Spinal stenosis, thoracic region: Secondary | ICD-10-CM | POA: Diagnosis not present

## 2021-04-17 DIAGNOSIS — G894 Chronic pain syndrome: Secondary | ICD-10-CM | POA: Diagnosis not present

## 2021-04-17 DIAGNOSIS — I10 Essential (primary) hypertension: Secondary | ICD-10-CM | POA: Diagnosis not present

## 2021-04-17 DIAGNOSIS — R269 Unspecified abnormalities of gait and mobility: Secondary | ICD-10-CM | POA: Diagnosis not present

## 2021-04-20 ENCOUNTER — Other Ambulatory Visit: Payer: Self-pay

## 2021-04-20 ENCOUNTER — Telehealth: Payer: Self-pay

## 2021-04-20 DIAGNOSIS — G629 Polyneuropathy, unspecified: Secondary | ICD-10-CM

## 2021-04-20 DIAGNOSIS — G5731 Lesion of lateral popliteal nerve, right lower limb: Secondary | ICD-10-CM

## 2021-04-20 DIAGNOSIS — M48062 Spinal stenosis, lumbar region with neurogenic claudication: Secondary | ICD-10-CM

## 2021-04-20 DIAGNOSIS — G8929 Other chronic pain: Secondary | ICD-10-CM

## 2021-04-20 DIAGNOSIS — M170 Bilateral primary osteoarthritis of knee: Secondary | ICD-10-CM

## 2021-04-20 DIAGNOSIS — M159 Polyosteoarthritis, unspecified: Secondary | ICD-10-CM

## 2021-04-20 DIAGNOSIS — M47812 Spondylosis without myelopathy or radiculopathy, cervical region: Secondary | ICD-10-CM

## 2021-04-20 DIAGNOSIS — M160 Bilateral primary osteoarthritis of hip: Secondary | ICD-10-CM

## 2021-04-20 DIAGNOSIS — M4804 Spinal stenosis, thoracic region: Secondary | ICD-10-CM

## 2021-04-20 DIAGNOSIS — M8949 Other hypertrophic osteoarthropathy, multiple sites: Secondary | ICD-10-CM

## 2021-04-20 DIAGNOSIS — G959 Disease of spinal cord, unspecified: Secondary | ICD-10-CM

## 2021-04-20 MED ORDER — HYDROCODONE-ACETAMINOPHEN 10-325 MG PO TABS: 1.0000 | ORAL_TABLET | Freq: Four times a day (QID) | ORAL | 0 refills | Status: DC | PRN

## 2021-04-20 NOTE — Telephone Encounter (Signed)
PT from [place] for PT verbal orders as follows:  1 time(s) weekly for 1 week(s), then 2 time(s) weekly for 4 week(s), then 1 time (s) weekly for 3 week(s).   Sharyn Lull from [place] calling for OT verbal orders as follows:  1 time(s) weekly for 1 week(s), then 2 time(s) weekly for 4 week(s)  Verbal orders given per [facility] protocol  Talbot Grumbling, RN

## 2021-04-24 DIAGNOSIS — G894 Chronic pain syndrome: Secondary | ICD-10-CM | POA: Diagnosis not present

## 2021-04-24 DIAGNOSIS — M48062 Spinal stenosis, lumbar region with neurogenic claudication: Secondary | ICD-10-CM | POA: Diagnosis not present

## 2021-04-24 DIAGNOSIS — R269 Unspecified abnormalities of gait and mobility: Secondary | ICD-10-CM | POA: Diagnosis not present

## 2021-04-24 DIAGNOSIS — M150 Primary generalized (osteo)arthritis: Secondary | ICD-10-CM | POA: Diagnosis not present

## 2021-04-24 DIAGNOSIS — I10 Essential (primary) hypertension: Secondary | ICD-10-CM | POA: Diagnosis not present

## 2021-04-24 DIAGNOSIS — M4804 Spinal stenosis, thoracic region: Secondary | ICD-10-CM | POA: Diagnosis not present

## 2021-04-26 DIAGNOSIS — I10 Essential (primary) hypertension: Secondary | ICD-10-CM | POA: Diagnosis not present

## 2021-04-26 DIAGNOSIS — M48062 Spinal stenosis, lumbar region with neurogenic claudication: Secondary | ICD-10-CM | POA: Diagnosis not present

## 2021-04-26 DIAGNOSIS — M150 Primary generalized (osteo)arthritis: Secondary | ICD-10-CM | POA: Diagnosis not present

## 2021-04-26 DIAGNOSIS — R269 Unspecified abnormalities of gait and mobility: Secondary | ICD-10-CM | POA: Diagnosis not present

## 2021-04-26 DIAGNOSIS — G894 Chronic pain syndrome: Secondary | ICD-10-CM | POA: Diagnosis not present

## 2021-04-26 DIAGNOSIS — M4804 Spinal stenosis, thoracic region: Secondary | ICD-10-CM | POA: Diagnosis not present

## 2021-04-28 DIAGNOSIS — M4804 Spinal stenosis, thoracic region: Secondary | ICD-10-CM | POA: Diagnosis not present

## 2021-04-28 DIAGNOSIS — M48062 Spinal stenosis, lumbar region with neurogenic claudication: Secondary | ICD-10-CM | POA: Diagnosis not present

## 2021-04-28 DIAGNOSIS — R269 Unspecified abnormalities of gait and mobility: Secondary | ICD-10-CM | POA: Diagnosis not present

## 2021-04-28 DIAGNOSIS — M150 Primary generalized (osteo)arthritis: Secondary | ICD-10-CM | POA: Diagnosis not present

## 2021-04-28 DIAGNOSIS — I10 Essential (primary) hypertension: Secondary | ICD-10-CM | POA: Diagnosis not present

## 2021-04-28 DIAGNOSIS — G894 Chronic pain syndrome: Secondary | ICD-10-CM | POA: Diagnosis not present

## 2021-05-01 ENCOUNTER — Telehealth: Payer: Self-pay

## 2021-05-01 ENCOUNTER — Ambulatory Visit (HOSPITAL_COMMUNITY)
Admission: EM | Admit: 2021-05-01 | Discharge: 2021-05-01 | Disposition: A | Discharge status: Home / Self Care | Payer: Medicare Other

## 2021-05-01 ENCOUNTER — Other Ambulatory Visit: Payer: Self-pay

## 2021-05-01 ENCOUNTER — Encounter (HOSPITAL_COMMUNITY): Payer: Self-pay

## 2021-05-01 DIAGNOSIS — I10 Essential (primary) hypertension: Secondary | ICD-10-CM | POA: Diagnosis not present

## 2021-05-01 DIAGNOSIS — M4804 Spinal stenosis, thoracic region: Secondary | ICD-10-CM | POA: Diagnosis not present

## 2021-05-01 DIAGNOSIS — M150 Primary generalized (osteo)arthritis: Secondary | ICD-10-CM | POA: Diagnosis not present

## 2021-05-01 DIAGNOSIS — R269 Unspecified abnormalities of gait and mobility: Secondary | ICD-10-CM | POA: Diagnosis not present

## 2021-05-01 DIAGNOSIS — M48062 Spinal stenosis, lumbar region with neurogenic claudication: Secondary | ICD-10-CM | POA: Diagnosis not present

## 2021-05-01 DIAGNOSIS — G894 Chronic pain syndrome: Secondary | ICD-10-CM | POA: Diagnosis not present

## 2021-05-01 NOTE — Discharge Instructions (Addendum)
-  Follow-up with your primary care provider as scheduled tomorrow morning -If you develop symptoms overnight like dizziness, headaches, chest pain, shortness of breath-stop and check your blood pressure.  If this is elevated over 140/90, call 911 or have a family member transport you to the emergency room.

## 2021-05-01 NOTE — Telephone Encounter (Signed)
**  Epic went down for a moment**   Continued note from below. Lysbeth Galas stated he did not feel she needed to go to the ED, however she seemed confused when I was I was talking to her. I scheduled patient an apt for tomorrow morning in [facility]. I called her daughter and she has concerns for her high blood pressure and does report the patient has been complaining of a headache. Informed daughter to take her to an UC or ED. Daughter agreed with plan and will followup tomorrow.

## 2021-05-01 NOTE — Telephone Encounter (Signed)
[place] nurse calls nurse line reporting abnormal BP at home visit today. Lysbeth Galas reports a blood pressure of 198/108 when he arrived and 20 minutes later "no real change." Lysbeth Galas reports the patient denies any headaches, blurry vision, SOB, or chest pain. Patient is on antibiotic for a UTI.
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
## 2021-05-01 NOTE — ED Triage Notes (Signed)
Pt in with c/o HTN that was noted today while pt was in physical therapy  Pt states her systolic was 045, but she does not remember what her diastolic was.  Pt states last week she experienced some dizziness and a headache  Denies any cp or sob

## 2021-05-02 ENCOUNTER — Telehealth: Payer: Self-pay

## 2021-05-02 ENCOUNTER — Ambulatory Visit (INDEPENDENT_AMBULATORY_CARE_PROVIDER_SITE_OTHER): Payer: Medicare Other | Admitting: Family Medicine

## 2021-05-02 ENCOUNTER — Other Ambulatory Visit: Payer: Self-pay | Admitting: Family Medicine

## 2021-05-02 VITALS — BP 152/91 | HR 80 | Ht 59.0 in | Wt 149.6 lb

## 2021-05-02 DIAGNOSIS — I10 Essential (primary) hypertension: Secondary | ICD-10-CM | POA: Diagnosis not present

## 2021-05-02 NOTE — Assessment & Plan Note (Addendum)
BP 170/80>152/91 today. Reviewed ED note from 5/16 patient asymptomatic with BP of 202/95. Asymptomatic today. Patient and unsure which medications she is taking whether it is losartan or HCTZ or both.  Plan to call when they get home and will make appropriate medication changes at that time. - Increased BP medication when appropriate as above (see consider increasing losartan or adding amlodipine) - Follow-up in 1 week for BP check and BMP if needed - BP reading precautions given

## 2021-05-02 NOTE — Telephone Encounter (Signed)
Daughter LVM on nurse line wanting to let provider from this am know her BP meds. Daughter states she has Losartan and HCTZ. However she has not been taking Losartan because it makes her dizzy. I attempted to call her back to discuss, however no answer. Will forward to provider.

## 2021-05-02 NOTE — Patient Instructions (Signed)
It was wonderful to see you today.  Please bring ALL of your medications with you to every visit.   Please call with the BP medication that you are taking so we can make the proper medication changes.   Call clinic if BP continues to be elevate 180/100 or above.   Please be sure to schedule follow up in 1 week at the front  desk before you leave today.   Please call the clinic at [phone] if your symptoms worsen or you have any concerns. It was our pleasure to serve you.  Dr. Janus Molder

## 2021-05-02 NOTE — Progress Notes (Signed)
    SUBJECTIVE:   CHIEF COMPLAINT / HPI:   Ms. Inabinet is a 87 yo F who presents for the following:  Hypertension: - Medications: Losartan 25 mg daily and HCTZ 25 mg daily on medication list - Compliance: Unclear. She is taking something for her blood pressure.  - Checking BP at home: No, but daughter has a wrist cuff at home - Denies any SOB, CP, vision changes, LE edema, medication SEs, or symptoms of hypotension - Diet: Eats a lot of food high in sodium including pork and canned soup - Exercise: PT 3x/wk   PERTINENT  PMH / PSH: TAA  OBJECTIVE:   BP (!) 152/91   Pulse 80   Ht 4\' 11"  (1.499 m)   Wt 149 lb 9.6 oz (67.9 kg)   LMP 12/17/1962   SpO2 98%   BMI 30.22 kg/m   General: Appears well, no acute distress. Age appropriate. Cardiac: RRR, normal heart sounds, no murmurs Respiratory: CTAB, normal effort Extremities: No edema or cyanosis.  ASSESSMENT/PLAN:   Essential hypertension, benign BP 170/80>152/91 today. Reviewed ED note from 5/16 patient asymptomatic with BP of 202/95. Asymptomatic today. Patient and unsure which medications she is taking whether it is losartan or HCTZ or both.  Plan to call when they get home and will make appropriate medication changes at that time. - Increased BP medication when appropriate as above (see consider increasing losartan or adding amlodipine) - Follow-up in 1 week for BP check and BMP if needed - BP reading precautions given  Gerlene Fee, [place]

## 2021-05-03 DIAGNOSIS — I10 Essential (primary) hypertension: Secondary | ICD-10-CM | POA: Diagnosis not present

## 2021-05-03 DIAGNOSIS — M150 Primary generalized (osteo)arthritis: Secondary | ICD-10-CM | POA: Diagnosis not present

## 2021-05-03 DIAGNOSIS — G894 Chronic pain syndrome: Secondary | ICD-10-CM | POA: Diagnosis not present

## 2021-05-03 DIAGNOSIS — M48062 Spinal stenosis, lumbar region with neurogenic claudication: Secondary | ICD-10-CM | POA: Diagnosis not present

## 2021-05-03 DIAGNOSIS — M4804 Spinal stenosis, thoracic region: Secondary | ICD-10-CM | POA: Diagnosis not present

## 2021-05-03 DIAGNOSIS — R269 Unspecified abnormalities of gait and mobility: Secondary | ICD-10-CM | POA: Diagnosis not present

## 2021-05-05 DIAGNOSIS — G894 Chronic pain syndrome: Secondary | ICD-10-CM | POA: Diagnosis not present

## 2021-05-05 DIAGNOSIS — M4804 Spinal stenosis, thoracic region: Secondary | ICD-10-CM | POA: Diagnosis not present

## 2021-05-05 DIAGNOSIS — R269 Unspecified abnormalities of gait and mobility: Secondary | ICD-10-CM | POA: Diagnosis not present

## 2021-05-05 DIAGNOSIS — M48062 Spinal stenosis, lumbar region with neurogenic claudication: Secondary | ICD-10-CM | POA: Diagnosis not present

## 2021-05-05 DIAGNOSIS — M150 Primary generalized (osteo)arthritis: Secondary | ICD-10-CM | POA: Diagnosis not present

## 2021-05-05 DIAGNOSIS — I10 Essential (primary) hypertension: Secondary | ICD-10-CM | POA: Diagnosis not present

## 2021-05-05 NOTE — Telephone Encounter (Signed)
Spoke with patient and daughter and both confirm BP of 118/76 with home cuff. No medication changes at this time. Plan to follow up next week.  Gerlene Fee, DO 05/05/2021, 6:39 PM PGY-2, [place]

## 2021-05-08 DIAGNOSIS — M4804 Spinal stenosis, thoracic region: Secondary | ICD-10-CM | POA: Diagnosis not present

## 2021-05-08 DIAGNOSIS — R269 Unspecified abnormalities of gait and mobility: Secondary | ICD-10-CM | POA: Diagnosis not present

## 2021-05-08 DIAGNOSIS — M150 Primary generalized (osteo)arthritis: Secondary | ICD-10-CM | POA: Diagnosis not present

## 2021-05-08 DIAGNOSIS — I10 Essential (primary) hypertension: Secondary | ICD-10-CM | POA: Diagnosis not present

## 2021-05-08 DIAGNOSIS — M48062 Spinal stenosis, lumbar region with neurogenic claudication: Secondary | ICD-10-CM | POA: Diagnosis not present

## 2021-05-08 DIAGNOSIS — G894 Chronic pain syndrome: Secondary | ICD-10-CM | POA: Diagnosis not present

## 2021-05-11 DIAGNOSIS — I10 Essential (primary) hypertension: Secondary | ICD-10-CM | POA: Diagnosis not present

## 2021-05-11 DIAGNOSIS — R269 Unspecified abnormalities of gait and mobility: Secondary | ICD-10-CM | POA: Diagnosis not present

## 2021-05-11 DIAGNOSIS — G894 Chronic pain syndrome: Secondary | ICD-10-CM | POA: Diagnosis not present

## 2021-05-11 DIAGNOSIS — M4804 Spinal stenosis, thoracic region: Secondary | ICD-10-CM | POA: Diagnosis not present

## 2021-05-11 DIAGNOSIS — M150 Primary generalized (osteo)arthritis: Secondary | ICD-10-CM | POA: Diagnosis not present

## 2021-05-11 DIAGNOSIS — M48062 Spinal stenosis, lumbar region with neurogenic claudication: Secondary | ICD-10-CM | POA: Diagnosis not present

## 2021-05-12 DIAGNOSIS — M4804 Spinal stenosis, thoracic region: Secondary | ICD-10-CM | POA: Diagnosis not present

## 2021-05-12 DIAGNOSIS — M150 Primary generalized (osteo)arthritis: Secondary | ICD-10-CM | POA: Diagnosis not present

## 2021-05-12 DIAGNOSIS — R269 Unspecified abnormalities of gait and mobility: Secondary | ICD-10-CM | POA: Diagnosis not present

## 2021-05-12 DIAGNOSIS — M48062 Spinal stenosis, lumbar region with neurogenic claudication: Secondary | ICD-10-CM | POA: Diagnosis not present

## 2021-05-12 DIAGNOSIS — G894 Chronic pain syndrome: Secondary | ICD-10-CM | POA: Diagnosis not present

## 2021-05-12 DIAGNOSIS — I10 Essential (primary) hypertension: Secondary | ICD-10-CM | POA: Diagnosis not present

## 2021-05-18 DIAGNOSIS — R269 Unspecified abnormalities of gait and mobility: Secondary | ICD-10-CM | POA: Diagnosis not present

## 2021-05-18 DIAGNOSIS — M4804 Spinal stenosis, thoracic region: Secondary | ICD-10-CM | POA: Diagnosis not present

## 2021-05-18 DIAGNOSIS — M48062 Spinal stenosis, lumbar region with neurogenic claudication: Secondary | ICD-10-CM | POA: Diagnosis not present

## 2021-05-18 DIAGNOSIS — G894 Chronic pain syndrome: Secondary | ICD-10-CM | POA: Diagnosis not present

## 2021-05-18 DIAGNOSIS — M150 Primary generalized (osteo)arthritis: Secondary | ICD-10-CM | POA: Diagnosis not present

## 2021-05-18 DIAGNOSIS — I10 Essential (primary) hypertension: Secondary | ICD-10-CM | POA: Diagnosis not present

## 2021-05-19 DIAGNOSIS — R269 Unspecified abnormalities of gait and mobility: Secondary | ICD-10-CM | POA: Diagnosis not present

## 2021-05-19 DIAGNOSIS — M48062 Spinal stenosis, lumbar region with neurogenic claudication: Secondary | ICD-10-CM | POA: Diagnosis not present

## 2021-05-19 DIAGNOSIS — I10 Essential (primary) hypertension: Secondary | ICD-10-CM | POA: Diagnosis not present

## 2021-05-19 DIAGNOSIS — M4804 Spinal stenosis, thoracic region: Secondary | ICD-10-CM | POA: Diagnosis not present

## 2021-05-19 DIAGNOSIS — M150 Primary generalized (osteo)arthritis: Secondary | ICD-10-CM | POA: Diagnosis not present

## 2021-05-19 DIAGNOSIS — G894 Chronic pain syndrome: Secondary | ICD-10-CM | POA: Diagnosis not present

## 2021-05-24 ENCOUNTER — Other Ambulatory Visit: Payer: Self-pay

## 2021-05-24 DIAGNOSIS — M48062 Spinal stenosis, lumbar region with neurogenic claudication: Secondary | ICD-10-CM

## 2021-05-24 DIAGNOSIS — M4804 Spinal stenosis, thoracic region: Secondary | ICD-10-CM | POA: Diagnosis not present

## 2021-05-24 DIAGNOSIS — M159 Polyosteoarthritis, unspecified: Secondary | ICD-10-CM

## 2021-05-24 DIAGNOSIS — G629 Polyneuropathy, unspecified: Secondary | ICD-10-CM

## 2021-05-24 DIAGNOSIS — G5731 Lesion of lateral popliteal nerve, right lower limb: Secondary | ICD-10-CM

## 2021-05-24 DIAGNOSIS — G959 Disease of spinal cord, unspecified: Secondary | ICD-10-CM

## 2021-05-24 DIAGNOSIS — I10 Essential (primary) hypertension: Secondary | ICD-10-CM | POA: Diagnosis not present

## 2021-05-24 DIAGNOSIS — R269 Unspecified abnormalities of gait and mobility: Secondary | ICD-10-CM | POA: Diagnosis not present

## 2021-05-24 DIAGNOSIS — M170 Bilateral primary osteoarthritis of knee: Secondary | ICD-10-CM

## 2021-05-24 DIAGNOSIS — G8929 Other chronic pain: Secondary | ICD-10-CM

## 2021-05-24 DIAGNOSIS — G894 Chronic pain syndrome: Secondary | ICD-10-CM | POA: Diagnosis not present

## 2021-05-24 DIAGNOSIS — M160 Bilateral primary osteoarthritis of hip: Secondary | ICD-10-CM

## 2021-05-24 DIAGNOSIS — M150 Primary generalized (osteo)arthritis: Secondary | ICD-10-CM | POA: Diagnosis not present

## 2021-05-24 DIAGNOSIS — M47812 Spondylosis without myelopathy or radiculopathy, cervical region: Secondary | ICD-10-CM

## 2021-05-24 MED ORDER — HYDROCODONE-ACETAMINOPHEN 10-325 MG PO TABS: 1.0000 | ORAL_TABLET | Freq: Four times a day (QID) | ORAL | 0 refills | Status: DC | PRN

## 2021-05-25 DIAGNOSIS — H524 Presbyopia: Secondary | ICD-10-CM | POA: Diagnosis not present

## 2021-05-25 DIAGNOSIS — H26493 Other secondary cataract, bilateral: Secondary | ICD-10-CM | POA: Diagnosis not present

## 2021-05-25 DIAGNOSIS — Z961 Presence of intraocular lens: Secondary | ICD-10-CM | POA: Diagnosis not present

## 2021-05-29 ENCOUNTER — Other Ambulatory Visit: Payer: Self-pay

## 2021-05-29 ENCOUNTER — Ambulatory Visit (INDEPENDENT_AMBULATORY_CARE_PROVIDER_SITE_OTHER): Payer: Medicare Other | Admitting: Family Medicine

## 2021-05-29 ENCOUNTER — Encounter: Payer: Self-pay | Admitting: Family Medicine

## 2021-05-29 DIAGNOSIS — B029 Zoster without complications: Secondary | ICD-10-CM | POA: Diagnosis not present

## 2021-05-29 MED ORDER — VALACYCLOVIR HCL 1 G PO TABS
1000.0000 mg | ORAL_TABLET | Freq: Three times a day (TID) | ORAL | 0 refills | Status: DC
Start: 2021-05-29 — End: 2021-06-22

## 2021-05-29 NOTE — Progress Notes (Signed)
    SUBJECTIVE:   CHIEF COMPLAINT / HPI:   Right flank pain  Patient presents for several day history of right flank pain.  She has been dealing with this intermittently for the last 6 months.  Reports that in December she went to the emergency department and was then evaluated in our clinic and it was determined that she had shingles.  She reports the pain is like it is "tearing" on her side.  She is unaware if she has a rash or not.  Denies any fever, chills.  Denies any urinary symptoms.  Chronically takes oxycodone for pain but reports that this does not help it.  Intermittently takes 200 mg ibuprofen and she says that is the only thing that really helps it.  With her previous event she was given valacyclovir which she reports helped the symptoms resolve almost immediately.  OBJECTIVE:   BP (!) 138/96   Pulse 78   Ht 4\' 11"  (1.499 m)   Wt 147 lb (66.7 kg)   LMP 12/17/1962   SpO2 98%   BMI 29.69 kg/m   General: Well-appearing 87 year old female in no acute distress Cardiac: Regular rate and rhythm, no murmur appreciated Respiratory: Normal work of breathing, lungs clear to auscultation bilaterally Abdomen: Soft, nontender, positive bowel sounds MSK: Patient with tenderness to palpation on right rib line, erythematous rash noted (see below)     ASSESSMENT/PLAN:   Zoster Patient's symptoms consistent with zoster although rash is not quite consistent with this.  Patient has no history of kidney stones or urinary issues at this time.  No recent injuries to cause this pain or rash. - Prescription sent to patient's pharmacy for valacyclovir which she will take 3 times daily for 7 days - Continue topical creams - Continue opiates as prescribed - Can use ibuprofen 2-400 mg every 8 hours for a day or 2 as needed - Strict ED and return precautions given and patient is agreeable to this     Gifford Shave, MD [place]

## 2021-05-29 NOTE — Patient Instructions (Signed)
It was wonderful seeing you today.  I am sorry you are having issues with this pain.  It may be a recurrence of shingles and I think it is worth treating to see if you recover.  I have sent a prescription for Valtrex to your pharmacy.  You will take this 3 times a day for 7 days.  Please let us know if the symptoms worsen.  You can also take 2-400 mg of ibuprofen every 6-8 hours but please do not take this for more than a day or 2 at a time.  I hope you have a wonderful afternoon!   Shingles  Shingles is an infection. It gives you a painful skin rash and blisters that have fluid in them. Shingles is caused by the same germ (virus) that causes chickenpox. Shingles only happens in people who: Have had chickenpox. Have been given a shot (vaccine) to protect against chickenpox. Shingles is rare in this group. What are the causes? This condition is caused by varicella-zoster virus. This is the same germ that causes chickenpox. After a person is exposed to the germ, the germ stays in the body but is not active (dormant). Shingles develops if the germ becomes active again (is reactivated). This can happen many years after the first exposure to the germ. It is notknown what causes this germ to become active again. What increases the risk? People who have had chickenpox or received the chickenpox shot are at risk for shingles. This infection is more common in people who: Are older than 85 years of age. Have a weakened disease-fighting system (immune system), such as people with: HIV (human immunodeficiency virus). AIDS (acquired immunodeficiency syndrome). Cancer. Are taking medicines that weaken the immune system, such as organ transplant medicines. Have a lot of stress. What are the signs or symptoms? The first symptoms of shingles may be itching, tingling, or pain in an area onyour skin. A rash will show on your skin a few days or weeks later. This is what usually happens: The rash is likely to be  on one side of your body. The rash usually has a shape like a belt or a band. Over time, the rash turns into fluid-filled blisters. The blisters will break open and change into scabs. The scabs usually dry up in about 2-3 weeks. You may also have: A fever. Chills. A headache. A feeling like you may vomit (nausea). How is this treated? The rash may last for several weeks. There is not a specific cure for thiscondition. Your doctor may prescribe medicines. Medicines may: Help with pain. Help you get better sooner. Help to prevent long-term problems. Help with itching (antihistamines). If the area involved is on your face, you may need to see a specialist. Thismay be an eye doctor or an ear, nose, and throat (ENT) doctor. Follow these instructions at home: Medicines Take over-the-counter and prescription medicines only as told by your doctor. Put on an anti-itch cream or numbing cream where you have a rash, blisters, or scabs. Do this as told by your doctor. Helping with itching and discomfort  Put cold, wet cloths (cold compresses) on the area of the rash or blisters as told by your doctor. Cool baths can help you feel better. Try adding baking soda or dry oatmeal to the water to lessen itching. Do not bathe in hot water. Use calamine lotion as told by your doctor.  Blister and rash care Keep your rash covered with a loose bandage (dressing). Wear loose clothing  that does not rub on your rash. Wash your hands with soap and water for at least 20 seconds before and after you change your bandage. If you cannot use soap and water, use hand sanitizer. Change your bandage as told by your doctor. Keep your rash and blisters clean. To do this, wash the area with mild soap and cool water as told by your doctor. Check your rash every day for signs of infection. Check for: More redness, swelling, or pain. Fluid or blood. Warmth. Pus or a bad smell. Do not scratch your rash. Do not pick at  your blisters. To help you to not scratch: Keep your fingernails clean and cut short. Wear gloves or mittens when you sleep, if scratching is a problem. General instructions Rest as told by your doctor. Wash your hands often with soap and water for at least 20 seconds. If you cannot use soap and water, use hand sanitizer. Doing this lowers your chance of getting a skin infection. Your infection can cause chickenpox in people who have never had chickenpox or never got a chickenpox vaccine shot. If you have blisters that did not change into scabs yet, try not to touch other people or be around other people, especially: Babies. Pregnant women. Children who have areas of red, itchy, or rough skin (eczema). Older people who have organ transplants. People who have a long-term (chronic) illness, like cancer or AIDS. Keep all follow-up visits. How is this prevented? A vaccine shot is the best way to prevent shingles and protect against shinglesproblems. If you have not had a vaccine shot, talk with your doctor about getting it. Where to find more information Centers for Disease Control and Prevention: http://www.wolf.info/ Contact a doctor if: Your pain does not get better with medicine. Your pain does not get better after the rash heals. You have any of these signs of infection around the rash: More redness, swelling, or pain. Fluid or blood. Warmth. Pus or a bad smell. You have a fever. Get help right away if: The rash is on your face or nose. You have pain in your face or pain by your eye. You lose feeling on one side of your face. You have trouble seeing. You have ear pain, or you have ringing in your ear. You have a loss of taste. Your condition gets worse. Summary Shingles gives you a painful skin rash and blisters that have fluid in them. Shingles is caused by the same germ (virus) that causes chickenpox. Keep your rash covered with a loose bandage. Wear loose clothing that does not rub on  your rash. If you have blisters that did not change into scabs yet, try not to touch other people or be around people. This information is not intended to replace advice given to you by your health care provider. Make sure you discuss any questions you have with your healthcare provider. Document Revised: 11/28/2020 Document Reviewed: 11/28/2020 Elsevier Patient Education  2022 [employer].

## 2021-05-30 ENCOUNTER — Ambulatory Visit: Payer: Medicare Other

## 2021-05-30 ENCOUNTER — Other Ambulatory Visit: Payer: Self-pay | Admitting: Family Medicine

## 2021-05-30 DIAGNOSIS — Z1231 Encounter for screening mammogram for malignant neoplasm of breast: Secondary | ICD-10-CM

## 2021-05-30 NOTE — Assessment & Plan Note (Addendum)
Patient's symptoms consistent with zoster although rash is not quite consistent with this.  Patient has no history of kidney stones or urinary issues at this time.  No recent injuries to cause this pain or rash. - Prescription sent to patient's pharmacy for valacyclovir which she will take 3 times daily for 7 days - Continue topical creams - Continue opiates as prescribed - Can use ibuprofen 2-400 mg every 8 hours for a day or 2 as needed - Strict ED and return precautions given and patient is agreeable to this

## 2021-05-31 DIAGNOSIS — M48062 Spinal stenosis, lumbar region with neurogenic claudication: Secondary | ICD-10-CM | POA: Diagnosis not present

## 2021-05-31 DIAGNOSIS — M150 Primary generalized (osteo)arthritis: Secondary | ICD-10-CM | POA: Diagnosis not present

## 2021-05-31 DIAGNOSIS — G894 Chronic pain syndrome: Secondary | ICD-10-CM | POA: Diagnosis not present

## 2021-05-31 DIAGNOSIS — R269 Unspecified abnormalities of gait and mobility: Secondary | ICD-10-CM | POA: Diagnosis not present

## 2021-05-31 DIAGNOSIS — I10 Essential (primary) hypertension: Secondary | ICD-10-CM | POA: Diagnosis not present

## 2021-05-31 DIAGNOSIS — M4804 Spinal stenosis, thoracic region: Secondary | ICD-10-CM | POA: Diagnosis not present

## 2021-06-06 ENCOUNTER — Telehealth: Payer: Self-pay

## 2021-06-06 DIAGNOSIS — G6289 Other specified polyneuropathies: Secondary | ICD-10-CM

## 2021-06-07 MED ORDER — GABAPENTIN 100 MG PO CAPS: 300.0000 mg | ORAL_CAPSULE | Freq: Three times a day (TID) | ORAL | 3 refills | Status: DC

## 2021-06-07 NOTE — Telephone Encounter (Signed)
Rx sent for gabapentin.

## 2021-06-22 ENCOUNTER — Ambulatory Visit (INDEPENDENT_AMBULATORY_CARE_PROVIDER_SITE_OTHER): Payer: Medicare Other | Admitting: Family Medicine

## 2021-06-22 ENCOUNTER — Encounter: Payer: Self-pay | Admitting: Family Medicine

## 2021-06-22 ENCOUNTER — Other Ambulatory Visit: Payer: Self-pay

## 2021-06-22 VITALS — BP 150/78 | HR 75 | Ht 59.0 in | Wt 152.1 lb

## 2021-06-22 DIAGNOSIS — G959 Disease of spinal cord, unspecified: Secondary | ICD-10-CM | POA: Diagnosis not present

## 2021-06-22 DIAGNOSIS — G5731 Lesion of lateral popliteal nerve, right lower limb: Secondary | ICD-10-CM

## 2021-06-22 DIAGNOSIS — M5441 Lumbago with sciatica, right side: Secondary | ICD-10-CM

## 2021-06-22 DIAGNOSIS — R059 Cough, unspecified: Secondary | ICD-10-CM

## 2021-06-22 DIAGNOSIS — M170 Bilateral primary osteoarthritis of knee: Secondary | ICD-10-CM | POA: Diagnosis not present

## 2021-06-22 DIAGNOSIS — M160 Bilateral primary osteoarthritis of hip: Secondary | ICD-10-CM | POA: Diagnosis not present

## 2021-06-22 DIAGNOSIS — M159 Polyosteoarthritis, unspecified: Secondary | ICD-10-CM

## 2021-06-22 DIAGNOSIS — R109 Unspecified abdominal pain: Secondary | ICD-10-CM | POA: Diagnosis not present

## 2021-06-22 DIAGNOSIS — G629 Polyneuropathy, unspecified: Secondary | ICD-10-CM

## 2021-06-22 DIAGNOSIS — M8949 Other hypertrophic osteoarthropathy, multiple sites: Secondary | ICD-10-CM | POA: Diagnosis not present

## 2021-06-22 DIAGNOSIS — M48062 Spinal stenosis, lumbar region with neurogenic claudication: Secondary | ICD-10-CM

## 2021-06-22 DIAGNOSIS — M47812 Spondylosis without myelopathy or radiculopathy, cervical region: Secondary | ICD-10-CM

## 2021-06-22 DIAGNOSIS — G63 Polyneuropathy in diseases classified elsewhere: Secondary | ICD-10-CM

## 2021-06-22 DIAGNOSIS — M461 Sacroiliitis, not elsewhere classified: Secondary | ICD-10-CM

## 2021-06-22 DIAGNOSIS — K5903 Drug induced constipation: Secondary | ICD-10-CM

## 2021-06-22 DIAGNOSIS — M4804 Spinal stenosis, thoracic region: Secondary | ICD-10-CM

## 2021-06-22 DIAGNOSIS — I10 Essential (primary) hypertension: Secondary | ICD-10-CM

## 2021-06-22 DIAGNOSIS — G8929 Other chronic pain: Secondary | ICD-10-CM

## 2021-06-22 DIAGNOSIS — M4807 Spinal stenosis, lumbosacral region: Secondary | ICD-10-CM

## 2021-06-22 MED ORDER — BISACODYL EC 5 MG PO TBEC: 10.0000 mg | DELAYED_RELEASE_TABLET | Freq: Every day | ORAL | 3 refills | Status: DC

## 2021-06-22 MED ORDER — POLYETHYLENE GLYCOL 3350 17 G PO PACK: 17.0000 g | PACK | Freq: Every day | ORAL | 3 refills | Status: AC

## 2021-06-22 MED ORDER — HYDROCODONE-ACETAMINOPHEN 10-325 MG PO TABS: 1.0000 | ORAL_TABLET | Freq: Four times a day (QID) | ORAL | 0 refills | Status: DC | PRN

## 2021-06-22 MED ORDER — ALBUTEROL SULFATE HFA 108 (90 BASE) MCG/ACT IN AERS
2.0000 | INHALATION_SPRAY | Freq: Four times a day (QID) | RESPIRATORY_TRACT | 0 refills | Status: DC | PRN
Start: 2021-06-22 — End: 2022-03-29

## 2021-06-22 MED ORDER — PREDNISONE 20 MG PO TABS: 20.0000 mg | ORAL_TABLET | Freq: Every day | ORAL | 0 refills | Status: DC

## 2021-06-22 NOTE — Patient Instructions (Signed)
The working explanation of your pain seems to be in your back muscle or back bones.    To decrease the pain without taking Ibuprofen, take prednisone 20 mg tablet, one tablet daily for 7 days.    For your constipation, drink one packet of Mira-lax powder (polyethylenene glycol) to soften your stool.  To make mush.  Also take your Bisacodyl tablets, two daily, every day to give your bowels push.    Together they will help keep you having a soft bowel movement each day.

## 2021-06-23 ENCOUNTER — Encounter: Payer: Self-pay | Admitting: Family Medicine

## 2021-06-23 NOTE — Progress Notes (Addendum)
Nicole Perez is accompanied by daughter [name]) Sources of clinical information for visit is/are patient, relative(s), and past medical records. Nursing assessment for this office visit was reviewed with the patient for accuracy and revision.     Previous Report(s) Reviewed: ER records, lab reports, office notes, radiology reports, and x-ray reports  Depression screen [facility] 2/9 06/22/2021  Decreased Interest 0  Down, Depressed, Hopeless 0  PHQ - 2 Score 0  Altered sleeping 0  Tired, decreased energy 3  Change in appetite 0  Feeling bad or failure about yourself  0  Trouble concentrating 0  Moving slowly or fidgety/restless 0  Suicidal thoughts 0  PHQ-9 Score 3  Difficult doing work/chores -  Some recent data might be hidden    Fall Risk  06/22/2021 03/02/2021 12/06/2020 09/29/2020 05/31/2020  Falls in the past year? 0 0 0 0 0  Number falls in past yr: 0 0 - - 0  Injury with Fall? 0 0 - - 0  Risk Factor Category  - - - - -  Risk for fall due to : - - - - -  Risk for fall due to: Comment - - - - -  Follow up - - - - -    PHQ9 SCORE ONLY 06/22/2021 05/02/2021 03/02/2021  PHQ-9 Total Score 3 3 3     Adult vaccines due  Topic Date Due   TETANUS/TDAP  07/10/2020    Health Maintenance Due  Topic Date Due   Zoster Vaccines- Shingrix (1 of 2) Never done   TETANUS/TDAP  07/10/2020   COVID-19 Vaccine (4 - Booster for Pfizer series) 12/30/2020      History/P.E. limitations: Hearing loss and cognitive impairment  Adult vaccines due  Topic Date Due   TETANUS/TDAP  07/10/2020   There are no preventive care reminders to display for this patient.  Health Maintenance Due  Topic Date Due   Zoster Vaccines- Shingrix (1 of 2) Never done   TETANUS/TDAP  07/10/2020   COVID-19 Vaccine (4 - Booster for Pfizer series) 12/30/2020     Chief Complaint  Patient presents with   Follow-up    Right side pain

## 2021-06-26 ENCOUNTER — Encounter: Payer: Self-pay | Admitting: Family Medicine

## 2021-06-26 DIAGNOSIS — G629 Polyneuropathy, unspecified: Secondary | ICD-10-CM | POA: Insufficient documentation

## 2021-06-26 DIAGNOSIS — M461 Sacroiliitis, not elsewhere classified: Secondary | ICD-10-CM

## 2021-06-26 HISTORY — DX: Sacroiliitis, not elsewhere classified: M46.1

## 2021-06-26 NOTE — Assessment & Plan Note (Addendum)
Established problem Uncontrolled Complaint twice diagnosed as Shingles and once as UTI. Onset: At least one year ago Location: around mid to lower back on right side Quality: tight aching Severity: varies mild to severe Function: makes ADLs slower  Pattern: constant with intermittent worsening Course: stable - neither worsening nor improving Radiation: no Relief: Ibuprofen helps some, but she knows to limit it.  Her Norco do not seem to help.  Precipitant: no recalled injury Associated Symptoms:       Restricted ROM/stiffness/swelling:  Stiff in right lower lumbar       Muscle ache/cramp/spasms: ache            Muscle strength change: no       Change in sensation (dysesthesia/itch or numbness): no change in chronic peripheral neuropathy pain Functional Impact:        Change in sleep from pain: can interfere with falling asleep       Trauma (Acute or Chronic): denies Prior Diagnostic Testing or Treatments: CT Chest 06/21 in ED for right sided pain;  CT AP 11/2020 in ED for right sided pain,  Known inoperable thoracolumbar severe spinal stenosis History on left Iliopsoas bursitis (MRI Pelvis) 08/18/2027.  Bilateral sacroiliac joint osteoarthritis bilateral, MRI Pelvis 08/17/2017  A/ Multiple possible origin sites of pain, Lumbar spine, peripheral nerves, pelvic OA, iliopsoas bursitis again. Only specificity comes from TPP of right lumbar paraspinal muscle.  Is this chronic pain muscular in origin?  Plan: Outpatient Physical Therapy MRI lumbar spine  Trial of prednisone 20 mg daily for 7 days since NSAIDs had help with the pain but was limited in use by age.  Basic Metabolic Panel Plan discussed with Nicole Perez, Nicole Perez' daughter.  Nicole Perez defers to Nicole Perez's decisions

## 2021-06-26 NOTE — Addendum Note (Signed)
Addended byWendy Poet, Caidin Heidenreich D on: 06/26/2021 11:47 AM   Modules accepted: Orders

## 2021-06-26 NOTE — Addendum Note (Signed)
Addended byWendy Poet, Alexis Reber D on: 06/26/2021 11:49 AM   Modules accepted: Orders

## 2021-06-30 ENCOUNTER — Other Ambulatory Visit: Payer: Medicare Other

## 2021-06-30 ENCOUNTER — Other Ambulatory Visit: Payer: Self-pay

## 2021-06-30 DIAGNOSIS — G629 Polyneuropathy, unspecified: Secondary | ICD-10-CM | POA: Diagnosis not present

## 2021-06-30 DIAGNOSIS — I10 Essential (primary) hypertension: Secondary | ICD-10-CM

## 2021-07-01 LAB — BASIC METABOLIC PANEL
BUN/Creatinine Ratio: 25 (ref 12–28)
BUN: 23 mg/dL (ref 8–27)
CO2: 25 mmol/L (ref 20–29)
Calcium: 9.4 mg/dL (ref 8.7–10.3)
Chloride: 104 mmol/L (ref 96–106)
Creatinine, Ser: 0.93 mg/dL (ref 0.57–1.00)
Glucose: 70 mg/dL (ref 65–99)
Potassium: 3.7 mmol/L (ref 3.5–5.2)
Sodium: 143 mmol/L (ref 134–144)
eGFR: 60 mL/min/{1.73_m2} (ref >59)

## 2021-07-06 ENCOUNTER — Other Ambulatory Visit: Payer: Self-pay

## 2021-07-06 ENCOUNTER — Ambulatory Visit
Admission: RE | Admit: 2021-07-06 | Discharge: 2021-07-06 | Disposition: A | Discharge status: Home / Self Care | Payer: Medicare Other | Source: Ambulatory Visit | Attending: Family Medicine | Admitting: Family Medicine

## 2021-07-06 DIAGNOSIS — M48061 Spinal stenosis, lumbar region without neurogenic claudication: Secondary | ICD-10-CM | POA: Diagnosis not present

## 2021-07-06 DIAGNOSIS — G959 Disease of spinal cord, unspecified: Secondary | ICD-10-CM

## 2021-07-06 DIAGNOSIS — M4807 Spinal stenosis, lumbosacral region: Secondary | ICD-10-CM

## 2021-07-06 DIAGNOSIS — M48062 Spinal stenosis, lumbar region with neurogenic claudication: Secondary | ICD-10-CM

## 2021-07-10 ENCOUNTER — Telehealth: Payer: Self-pay | Admitting: Family Medicine

## 2021-07-10 DIAGNOSIS — G8929 Other chronic pain: Secondary | ICD-10-CM

## 2021-07-10 DIAGNOSIS — M4804 Spinal stenosis, thoracic region: Secondary | ICD-10-CM

## 2021-07-10 DIAGNOSIS — M48062 Spinal stenosis, lumbar region with neurogenic claudication: Secondary | ICD-10-CM

## 2021-07-10 NOTE — Telephone Encounter (Signed)
I spoke with patient's daughter, [name], about her mother's [place] lumbar spine MrI on 7.21.22.   There is progression at multiple levels creating greater stenoses that could be causing radicular, possibly myelopathic, pains, particularly right peristent right flank pain the patients has had for many months.    The patient's right flank pain persists.  Since Ms gradney is not considered a surgical candidate by her neurosurgeon, it seems reasonalable to ask her Physiatrist, Dr Normajean Glasgow, to consult as to whether she may benefit from spinal injections. Dr Mina Marble has provided Ms Shattles with such injections in the past.   Ms Quentin Cornwall requested we set up a consultation with Dr Mina Marble.  A referral order for Dr Mina Marble at [facility] was entered.

## 2021-07-11 NOTE — Telephone Encounter (Signed)
Routed to [employer].Salvatore Marvel, CMA

## 2021-07-11 NOTE — Telephone Encounter (Signed)
Referral faxed.  Sylena Lotter,CMA  

## 2021-07-16 ENCOUNTER — Other Ambulatory Visit: Payer: Self-pay | Admitting: Family Medicine

## 2021-07-16 DIAGNOSIS — M48062 Spinal stenosis, lumbar region with neurogenic claudication: Secondary | ICD-10-CM

## 2021-07-24 ENCOUNTER — Other Ambulatory Visit: Payer: Self-pay

## 2021-07-24 ENCOUNTER — Ambulatory Visit
Admission: RE | Admit: 2021-07-24 | Discharge: 2021-07-24 | Disposition: A | Discharge status: Home / Self Care | Payer: Medicare Other | Source: Ambulatory Visit | Attending: Family Medicine | Admitting: Family Medicine

## 2021-07-24 DIAGNOSIS — G5731 Lesion of lateral popliteal nerve, right lower limb: Secondary | ICD-10-CM

## 2021-07-24 DIAGNOSIS — M159 Polyosteoarthritis, unspecified: Secondary | ICD-10-CM

## 2021-07-24 DIAGNOSIS — M5441 Lumbago with sciatica, right side: Secondary | ICD-10-CM

## 2021-07-24 DIAGNOSIS — Z1231 Encounter for screening mammogram for malignant neoplasm of breast: Secondary | ICD-10-CM | POA: Diagnosis not present

## 2021-07-24 DIAGNOSIS — G629 Polyneuropathy, unspecified: Secondary | ICD-10-CM

## 2021-07-24 DIAGNOSIS — M48062 Spinal stenosis, lumbar region with neurogenic claudication: Secondary | ICD-10-CM

## 2021-07-24 DIAGNOSIS — M8949 Other hypertrophic osteoarthropathy, multiple sites: Secondary | ICD-10-CM

## 2021-07-24 DIAGNOSIS — M4804 Spinal stenosis, thoracic region: Secondary | ICD-10-CM

## 2021-07-24 DIAGNOSIS — M160 Bilateral primary osteoarthritis of hip: Secondary | ICD-10-CM

## 2021-07-24 DIAGNOSIS — G8929 Other chronic pain: Secondary | ICD-10-CM

## 2021-07-24 DIAGNOSIS — M170 Bilateral primary osteoarthritis of knee: Secondary | ICD-10-CM

## 2021-07-24 DIAGNOSIS — G959 Disease of spinal cord, unspecified: Secondary | ICD-10-CM

## 2021-07-24 DIAGNOSIS — M47812 Spondylosis without myelopathy or radiculopathy, cervical region: Secondary | ICD-10-CM

## 2021-07-24 MED ORDER — HYDROCODONE-ACETAMINOPHEN 10-325 MG PO TABS: 1.0000 | ORAL_TABLET | Freq: Four times a day (QID) | ORAL | 0 refills | Status: DC | PRN

## 2021-07-25 ENCOUNTER — Other Ambulatory Visit: Payer: Self-pay

## 2021-07-25 ENCOUNTER — Encounter: Payer: Self-pay | Admitting: Physical Therapy

## 2021-07-25 ENCOUNTER — Ambulatory Visit: Payer: Medicare Other | Attending: Family Medicine | Admitting: Physical Therapy

## 2021-07-25 ENCOUNTER — Encounter: Payer: Self-pay | Admitting: Family Medicine

## 2021-07-25 DIAGNOSIS — G8929 Other chronic pain: Secondary | ICD-10-CM | POA: Insufficient documentation

## 2021-07-25 DIAGNOSIS — M6281 Muscle weakness (generalized): Secondary | ICD-10-CM | POA: Insufficient documentation

## 2021-07-25 DIAGNOSIS — M62830 Muscle spasm of back: Secondary | ICD-10-CM | POA: Insufficient documentation

## 2021-07-25 DIAGNOSIS — M545 Low back pain, unspecified: Secondary | ICD-10-CM | POA: Insufficient documentation

## 2021-07-25 DIAGNOSIS — R293 Abnormal posture: Secondary | ICD-10-CM | POA: Insufficient documentation

## 2021-07-25 HISTORY — DX: Abnormal posture: R29.3

## 2021-07-25 MED ORDER — HYDROCODONE-ACETAMINOPHEN 10-325 MG PO TABS: 1.0000 | ORAL_TABLET | Freq: Four times a day (QID) | ORAL | 0 refills | Status: DC | PRN

## 2021-07-25 NOTE — Addendum Note (Signed)
Addended by: Lona Millard C on: 07/25/2021 09:30 AM   Modules accepted: Orders

## 2021-07-25 NOTE — Telephone Encounter (Signed)
Patient's daughter returns call to nurse line regarding rx. Reports that [facility] on [address] does not have medication in stock and they are unsure when they will receive a shipment.   Pharmacist recommended having prescription transferred to [facility] on [address].   Called and canceled original rx at [facility] on [address].   Please resend new rx to [facility] on [address].   Talbot Grumbling, RN

## 2021-07-25 NOTE — Telephone Encounter (Signed)
Replacement Rx for hydrocodone sent to [facility] on [address].
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
## 2021-07-28 DIAGNOSIS — M47816 Spondylosis without myelopathy or radiculopathy, lumbar region: Secondary | ICD-10-CM | POA: Diagnosis not present

## 2021-08-07 ENCOUNTER — Ambulatory Visit: Payer: Medicare Other | Admitting: Physical Therapy

## 2021-08-08 ENCOUNTER — Ambulatory Visit: Payer: Medicare Other | Admitting: Physical Therapy

## 2021-08-08 ENCOUNTER — Other Ambulatory Visit: Payer: Self-pay

## 2021-08-08 DIAGNOSIS — M6281 Muscle weakness (generalized): Secondary | ICD-10-CM

## 2021-08-08 DIAGNOSIS — M62830 Muscle spasm of back: Secondary | ICD-10-CM | POA: Diagnosis not present

## 2021-08-08 DIAGNOSIS — R293 Abnormal posture: Secondary | ICD-10-CM | POA: Diagnosis not present

## 2021-08-08 DIAGNOSIS — G8929 Other chronic pain: Secondary | ICD-10-CM

## 2021-08-08 DIAGNOSIS — M545 Low back pain, unspecified: Secondary | ICD-10-CM | POA: Diagnosis not present
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
## 2021-08-15 ENCOUNTER — Encounter: Payer: Medicare Other | Admitting: Physical Therapy

## 2021-08-16 DIAGNOSIS — R339 Retention of urine, unspecified: Secondary | ICD-10-CM | POA: Diagnosis not present

## 2021-08-17 DIAGNOSIS — M47816 Spondylosis without myelopathy or radiculopathy, lumbar region: Secondary | ICD-10-CM | POA: Diagnosis not present

## 2021-08-22 ENCOUNTER — Telehealth: Payer: Self-pay

## 2021-08-22 NOTE — Telephone Encounter (Signed)
Called pt to schedule AWV. Pt dtr stated that pt brother passed away recently.

## 2021-08-24 ENCOUNTER — Other Ambulatory Visit: Payer: Self-pay

## 2021-08-24 DIAGNOSIS — G629 Polyneuropathy, unspecified: Secondary | ICD-10-CM

## 2021-08-24 DIAGNOSIS — M47812 Spondylosis without myelopathy or radiculopathy, cervical region: Secondary | ICD-10-CM

## 2021-08-24 DIAGNOSIS — M5441 Lumbago with sciatica, right side: Secondary | ICD-10-CM

## 2021-08-24 DIAGNOSIS — M4804 Spinal stenosis, thoracic region: Secondary | ICD-10-CM

## 2021-08-24 DIAGNOSIS — G959 Disease of spinal cord, unspecified: Secondary | ICD-10-CM

## 2021-08-24 DIAGNOSIS — M170 Bilateral primary osteoarthritis of knee: Secondary | ICD-10-CM

## 2021-08-24 DIAGNOSIS — M8949 Other hypertrophic osteoarthropathy, multiple sites: Secondary | ICD-10-CM

## 2021-08-24 DIAGNOSIS — M160 Bilateral primary osteoarthritis of hip: Secondary | ICD-10-CM

## 2021-08-24 DIAGNOSIS — G5731 Lesion of lateral popliteal nerve, right lower limb: Secondary | ICD-10-CM

## 2021-08-24 DIAGNOSIS — M159 Polyosteoarthritis, unspecified: Secondary | ICD-10-CM

## 2021-08-24 DIAGNOSIS — G8929 Other chronic pain: Secondary | ICD-10-CM

## 2021-08-24 DIAGNOSIS — M48062 Spinal stenosis, lumbar region with neurogenic claudication: Secondary | ICD-10-CM

## 2021-08-24 MED ORDER — HYDROCODONE-ACETAMINOPHEN 10-325 MG PO TABS: 1.0000 | ORAL_TABLET | Freq: Four times a day (QID) | ORAL | 0 refills | Status: DC | PRN

## 2021-08-25 MED ORDER — HYDROCODONE-ACETAMINOPHEN 10-325 MG PO TABS: 1.0000 | ORAL_TABLET | Freq: Four times a day (QID) | ORAL | 0 refills | Status: DC | PRN

## 2021-08-25 NOTE — Telephone Encounter (Signed)
Patient's daughter returns call to nurse line. Daughter reports that [facility] on [address] does not have enough stock of hydrocodone to fill rx.   Daughter is requesting that rx be sent to [facility] on [address].   Called and canceled rx at [facility].   Please send new rx to [facility] on [address].   Talbot Grumbling, RN

## 2021-08-25 NOTE — Addendum Note (Signed)
Addended by: Talbot Grumbling on: 08/25/2021 09:19 AM   Modules accepted: Orders

## 2021-08-28 NOTE — Telephone Encounter (Signed)
Nicole Perez left a message stating that pt was interested in scheduling the medicare wellness visit.   LVM for Nicole Perez to call me or the office back to schedule AWV.

## 2021-08-30 ENCOUNTER — Ambulatory Visit: Payer: Medicare Other | Admitting: Physical Therapy

## 2021-09-01 ENCOUNTER — Other Ambulatory Visit: Payer: Self-pay | Admitting: Family Medicine

## 2021-09-01 DIAGNOSIS — N3020 Other chronic cystitis without hematuria: Secondary | ICD-10-CM

## 2021-09-05 ENCOUNTER — Other Ambulatory Visit: Payer: Self-pay

## 2021-09-05 ENCOUNTER — Other Ambulatory Visit: Payer: Self-pay | Admitting: Family Medicine

## 2021-09-05 ENCOUNTER — Encounter: Payer: Self-pay | Admitting: Physical Therapy

## 2021-09-05 ENCOUNTER — Ambulatory Visit: Payer: Medicare Other | Attending: Family Medicine | Admitting: Physical Therapy

## 2021-09-05 DIAGNOSIS — G8929 Other chronic pain: Secondary | ICD-10-CM | POA: Diagnosis not present

## 2021-09-05 DIAGNOSIS — G6289 Other specified polyneuropathies: Secondary | ICD-10-CM

## 2021-09-05 DIAGNOSIS — M6281 Muscle weakness (generalized): Secondary | ICD-10-CM | POA: Diagnosis not present

## 2021-09-05 DIAGNOSIS — R293 Abnormal posture: Secondary | ICD-10-CM | POA: Diagnosis not present

## 2021-09-05 DIAGNOSIS — M62830 Muscle spasm of back: Secondary | ICD-10-CM | POA: Diagnosis not present

## 2021-09-05 DIAGNOSIS — M545 Low back pain, unspecified: Secondary | ICD-10-CM | POA: Diagnosis not present
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
## 2021-09-08 DIAGNOSIS — M47816 Spondylosis without myelopathy or radiculopathy, lumbar region: Secondary | ICD-10-CM | POA: Diagnosis not present

## 2021-09-19 ENCOUNTER — Encounter: Payer: Self-pay | Admitting: Physical Therapy

## 2021-09-19 ENCOUNTER — Ambulatory Visit: Payer: Medicare Other | Attending: Family Medicine | Admitting: Physical Therapy

## 2021-09-19 ENCOUNTER — Other Ambulatory Visit: Payer: Self-pay

## 2021-09-19 DIAGNOSIS — M62830 Muscle spasm of back: Secondary | ICD-10-CM | POA: Insufficient documentation

## 2021-09-19 DIAGNOSIS — G8929 Other chronic pain: Secondary | ICD-10-CM | POA: Diagnosis not present

## 2021-09-19 DIAGNOSIS — R293 Abnormal posture: Secondary | ICD-10-CM | POA: Diagnosis not present

## 2021-09-19 DIAGNOSIS — M545 Low back pain, unspecified: Secondary | ICD-10-CM | POA: Diagnosis not present
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
## 2021-09-25 ENCOUNTER — Other Ambulatory Visit: Payer: Self-pay

## 2021-09-25 DIAGNOSIS — M159 Polyosteoarthritis, unspecified: Secondary | ICD-10-CM

## 2021-09-25 DIAGNOSIS — M48062 Spinal stenosis, lumbar region with neurogenic claudication: Secondary | ICD-10-CM

## 2021-09-25 DIAGNOSIS — G959 Disease of spinal cord, unspecified: Secondary | ICD-10-CM

## 2021-09-25 DIAGNOSIS — M160 Bilateral primary osteoarthritis of hip: Secondary | ICD-10-CM

## 2021-09-25 DIAGNOSIS — G8929 Other chronic pain: Secondary | ICD-10-CM

## 2021-09-25 DIAGNOSIS — M47812 Spondylosis without myelopathy or radiculopathy, cervical region: Secondary | ICD-10-CM

## 2021-09-25 DIAGNOSIS — G629 Polyneuropathy, unspecified: Secondary | ICD-10-CM

## 2021-09-25 DIAGNOSIS — M4804 Spinal stenosis, thoracic region: Secondary | ICD-10-CM

## 2021-09-25 DIAGNOSIS — G5731 Lesion of lateral popliteal nerve, right lower limb: Secondary | ICD-10-CM

## 2021-09-25 DIAGNOSIS — M170 Bilateral primary osteoarthritis of knee: Secondary | ICD-10-CM

## 2021-09-25 MED ORDER — HYDROCODONE-ACETAMINOPHEN 10-325 MG PO TABS: 1.0000 | ORAL_TABLET | Freq: Four times a day (QID) | ORAL | 0 refills | Status: DC | PRN

## 2021-10-11 ENCOUNTER — Encounter: Payer: Medicare Other | Admitting: Physical Therapy

## 2021-10-23 ENCOUNTER — Other Ambulatory Visit: Payer: Self-pay

## 2021-10-23 DIAGNOSIS — G629 Polyneuropathy, unspecified: Secondary | ICD-10-CM

## 2021-10-23 DIAGNOSIS — G5731 Lesion of lateral popliteal nerve, right lower limb: Secondary | ICD-10-CM

## 2021-10-23 DIAGNOSIS — M170 Bilateral primary osteoarthritis of knee: Secondary | ICD-10-CM

## 2021-10-23 DIAGNOSIS — M159 Polyosteoarthritis, unspecified: Secondary | ICD-10-CM

## 2021-10-23 DIAGNOSIS — M4804 Spinal stenosis, thoracic region: Secondary | ICD-10-CM

## 2021-10-23 DIAGNOSIS — M47812 Spondylosis without myelopathy or radiculopathy, cervical region: Secondary | ICD-10-CM

## 2021-10-23 DIAGNOSIS — G959 Disease of spinal cord, unspecified: Secondary | ICD-10-CM

## 2021-10-23 DIAGNOSIS — M48062 Spinal stenosis, lumbar region with neurogenic claudication: Secondary | ICD-10-CM

## 2021-10-23 DIAGNOSIS — G8929 Other chronic pain: Secondary | ICD-10-CM

## 2021-10-23 DIAGNOSIS — M160 Bilateral primary osteoarthritis of hip: Secondary | ICD-10-CM

## 2021-10-24 ENCOUNTER — Encounter: Payer: Self-pay | Admitting: Family Medicine

## 2021-10-24 MED ORDER — HYDROCODONE-ACETAMINOPHEN 10-325 MG PO TABS: 1.0000 | ORAL_TABLET | Freq: Four times a day (QID) | ORAL | 0 refills | Status: DC | PRN

## 2021-10-26 ENCOUNTER — Ambulatory Visit (INDEPENDENT_AMBULATORY_CARE_PROVIDER_SITE_OTHER): Payer: Medicare Other | Admitting: Family Medicine

## 2021-10-26 ENCOUNTER — Other Ambulatory Visit: Payer: Self-pay

## 2021-10-26 ENCOUNTER — Ambulatory Visit (INDEPENDENT_AMBULATORY_CARE_PROVIDER_SITE_OTHER): Payer: Medicare Other

## 2021-10-26 ENCOUNTER — Encounter: Payer: Self-pay | Admitting: Family Medicine

## 2021-10-26 VITALS — BP 169/112 | HR 84 | Ht 59.0 in | Wt 151.2 lb

## 2021-10-26 DIAGNOSIS — G629 Polyneuropathy, unspecified: Secondary | ICD-10-CM

## 2021-10-26 DIAGNOSIS — M170 Bilateral primary osteoarthritis of knee: Secondary | ICD-10-CM

## 2021-10-26 DIAGNOSIS — D500 Iron deficiency anemia secondary to blood loss (chronic): Secondary | ICD-10-CM | POA: Diagnosis not present

## 2021-10-26 DIAGNOSIS — M4715 Other spondylosis with myelopathy, thoracolumbar region: Secondary | ICD-10-CM

## 2021-10-26 DIAGNOSIS — I10 Essential (primary) hypertension: Secondary | ICD-10-CM

## 2021-10-26 DIAGNOSIS — N3289 Other specified disorders of bladder: Secondary | ICD-10-CM

## 2021-10-26 DIAGNOSIS — M150 Primary generalized (osteo)arthritis: Secondary | ICD-10-CM

## 2021-10-26 DIAGNOSIS — G5731 Lesion of lateral popliteal nerve, right lower limb: Secondary | ICD-10-CM

## 2021-10-26 DIAGNOSIS — R3915 Urgency of urination: Secondary | ICD-10-CM

## 2021-10-26 DIAGNOSIS — G8929 Other chronic pain: Secondary | ICD-10-CM

## 2021-10-26 DIAGNOSIS — I712 Thoracic aortic aneurysm, without rupture, unspecified: Secondary | ICD-10-CM

## 2021-10-26 DIAGNOSIS — I700 Atherosclerosis of aorta: Secondary | ICD-10-CM

## 2021-10-26 DIAGNOSIS — G959 Disease of spinal cord, unspecified: Secondary | ICD-10-CM | POA: Diagnosis not present

## 2021-10-26 DIAGNOSIS — M159 Polyosteoarthritis, unspecified: Secondary | ICD-10-CM

## 2021-10-26 DIAGNOSIS — M47812 Spondylosis without myelopathy or radiculopathy, cervical region: Secondary | ICD-10-CM

## 2021-10-26 DIAGNOSIS — M48062 Spinal stenosis, lumbar region with neurogenic claudication: Secondary | ICD-10-CM | POA: Diagnosis not present

## 2021-10-26 DIAGNOSIS — M4804 Spinal stenosis, thoracic region: Secondary | ICD-10-CM

## 2021-10-26 DIAGNOSIS — Z23 Encounter for immunization: Secondary | ICD-10-CM

## 2021-10-26 DIAGNOSIS — M5441 Lumbago with sciatica, right side: Secondary | ICD-10-CM | POA: Diagnosis not present

## 2021-10-26 DIAGNOSIS — G609 Hereditary and idiopathic neuropathy, unspecified: Secondary | ICD-10-CM

## 2021-10-26 DIAGNOSIS — M160 Bilateral primary osteoarthritis of hip: Secondary | ICD-10-CM

## 2021-10-26 DIAGNOSIS — M461 Sacroiliitis, not elsewhere classified: Secondary | ICD-10-CM

## 2021-10-26 DIAGNOSIS — I872 Venous insufficiency (chronic) (peripheral): Secondary | ICD-10-CM

## 2021-10-26 MED ORDER — SOLIFENACIN SUCCINATE 5 MG PO TABS: 5.0000 mg | ORAL_TABLET | Freq: Every day | ORAL | 5 refills | Status: DC

## 2021-10-26 MED ORDER — HYDROCODONE-ACETAMINOPHEN 10-325 MG PO TABS: 1.0000 | ORAL_TABLET | Freq: Four times a day (QID) | ORAL | 0 refills | Status: DC | PRN

## 2021-10-26 NOTE — Patient Instructions (Addendum)
We sent your pain medication to the [facility] on [address].   Watch your blood pressure at home.   If the top number is more than 150, let Dr Husain Costabile know.     Start Solifenacin (Vesicare) to decrease the the leaking of urinary.

## 2021-10-26 NOTE — Assessment & Plan Note (Addendum)
Patient's symptoms well controlled on gabapentin. Continue current regimen. Will recheck vitamin B12 levels today given last levels multiple years ago.

## 2021-10-26 NOTE — Assessment & Plan Note (Signed)
Patient's BP on exam today high; however, normal ranges at home are more reassuring. Given the anomaly in the office, will not make changes to BP medications at this time. Will reassess BP control at next visit. Continue current HCTZ and losartan regimen.

## 2021-10-26 NOTE — Progress Notes (Signed)
SUBJECTIVE:   CHIEF COMPLAINT / HPI:   Nicole Perez (MRN: [id number]) is a 87 y.o. female with a history of HTN, sensory hearing loss, peroneal neuropathy, osteoarthritis, and cervical spondylosis who presents for a check up.  HTN Patient states she has been taking her HCTZ and losartan as prescribed at home. Her BPs at home have been normal.  Neuropathy Patient has been taking her gabapentin as prescribed. She states that at times, she feels her feet are on fire. However, most of the time, her symptoms are well controlled on her current regimen.  Osteoarthritis Patient feels her pain is well controlled with Norco. She has been experiencing some difficulty obtaining this medication due to the [place] moving buildings. She took 650 mg of Tylenol due to being out of Norco, and it provided minimal relief.  Bowel movements Patient's bowel habits are controlled and stable on docusate and bisacodyl. She is tolerating the medications well.  Leg swelling Patient notices her right leg tends to swell after being up on her legs for a while. She notes the swelling goes down after having them up in bed. She does not endorse any pain, redness, or warmth of the area when swollen.  Bladder dysfunction Patient's bladder dysfunction secondary to compression myelopathy has been causing some "spasms" that she feels intermittently. She notices dribbling of urine during this time. She has also been self-cathing herself about 4 times a day to relieve her bladder. She takes doxycycline as prophylaxis when she feels she is getting a UTI.  OBJECTIVE:   BP (!) 169/112   Pulse 84   Ht 4\' 11"  (1.499 m)   Wt 151 lb 3.2 oz (68.6 kg)   LMP 12/17/1962   SpO2 100%   BMI 30.54 kg/m    PHYSICAL EXAM  GEN: Well-developed, in NAD HEAD: NCAT CVS: Regular rate and rhythm, occasional PVCs, normal S1/S2, no murmurs, rubs, gallops RESP: Breathing comfortably on RA, no retractions, wheezes, rhonchi, or  crackles ABD: Non-distended EXT: Moves all extremities grossly equally, no swelling, erythema, or pain of lower extremities, ambulates with walker  ASSESSMENT/PLAN:   Osteoarthritis of spine with myelopathy, thoracolumbar region Patient's pain is stable while on Norco. Given difficulty obtaining medication from current pharmacy, will send in prescription to new pharmacy, [facility] on [address].  Bladder spasm Patient's feeling of spasms in lower abdomen with dribbling of urine, in the context of compression myelopathy, is likely secondary to neurological bladder dysfunction. Will prescribe Vesicare to relax bladder. Follow up at next visit after Christmas and assess improvement.  Venous insufficiency Patient's symptoms of increased swelling after being on her feet for long periods of time that resolves with leg elevation is likely venous insufficiency. Lack of marked swelling, erythema, and pain to the touch on exam is reassuring against more serious diagnoses such as DVT. Counseled patient on this typical age-related change and trying to keep feet up as much as possible throughout the day.  Essential hypertension, benign Patient's BP on exam today high; however, normal ranges at home are more reassuring. Given the anomaly in the office, will not make changes to BP medications at this time. Will reassess BP control at next visit. Continue current HCTZ and losartan regimen.  Peripheral painful Neuropathy (HCC) Patient's symptoms well controlled on gabapentin. Continue current regimen. Will recheck vitamin B12 levels today given last levels multiple years ago.   Bleeding gastrointestinal Patient has seen GI with [facility]. She reports no noticeable sites of bleeding. Given history of GI  bleeding and last CBC demonstrating anemia, will recheck CBC today.   Bowel dysfunction Patient's bowel movements more regular on docusate and bisacodyl. Continue current medication regimen.  Health  maintenance Patient received flu and COVID-19 booster without incident.  [place]

## 2021-10-26 NOTE — Assessment & Plan Note (Signed)
Patient's pain is stable while on Norco. Given difficulty obtaining medication from current pharmacy, will send in prescription to new pharmacy, [facility] on [address].

## 2021-10-26 NOTE — Assessment & Plan Note (Signed)
Patient has seen GI with [facility]. She reports no noticeable sites of bleeding. Given history of GI bleeding and last CBC demonstrating anemia, will recheck CBC today.

## 2021-10-26 NOTE — Assessment & Plan Note (Signed)
Patient's symptoms of increased swelling after being on her feet for long periods of time that resolves with leg elevation is likely venous insufficiency. Lack of marked swelling, erythema, and pain to the touch on exam is reassuring against more serious diagnoses such as DVT. Counseled patient on this typical age-related change and trying to keep feet up as much as possible throughout the day.

## 2021-10-26 NOTE — Assessment & Plan Note (Signed)
Patient's feeling of spasms in lower abdomen with dribbling of urine, in the context of compression myelopathy, is likely secondary to neurological bladder dysfunction. Will prescribe Vesicare to relax bladder. Follow up at next visit after Christmas and assess improvement.

## 2021-10-27 ENCOUNTER — Encounter: Payer: Self-pay | Admitting: Family Medicine

## 2021-10-27 LAB — CBC
Hematocrit: 35.1 % (ref 34.0–46.6)
Hemoglobin: 11.8 g/dL (ref 11.1–15.9)
MCH: 28.6 pg (ref 26.6–33.0)
MCHC: 33.6 g/dL (ref 31.5–35.7)
MCV: 85 fL (ref 79–97)
Platelets: 233 10*3/uL (ref 150–450)
RBC: 4.13 x10E6/uL (ref 3.77–5.28)
RDW: 14.2 % (ref 11.7–15.4)
WBC: 5.2 10*3/uL (ref 3.4–10.8)

## 2021-10-27 LAB — VITAMIN B12: Vitamin B-12: 291 pg/mL (ref 232–1245)

## 2021-10-27 NOTE — Assessment & Plan Note (Signed)
Established problem Well Controlled. No signs of complications, medication side effects, or red flags. Continue current medications and other regiments.  

## 2021-10-27 NOTE — Assessment & Plan Note (Signed)
Established problem Well Controlled. Will refer to Vascular surgery at next office visit

## 2021-10-31 ENCOUNTER — Telehealth: Payer: Self-pay

## 2021-10-31 NOTE — Telephone Encounter (Signed)
Daughter calls nurse line reporting undesired side effects to pain medication. Daughter reports they had to get her pain medication from a different pharmacy and the pills are different. Daughter reports the tablets don't appear to have a "coating," therefore they are hurting her stomach. Daughter reports diarrhea and a "burning sensation" after she takes the medication.   Advised daughter to have her try and take with food. Will forward to PCP for recommendations.

## 2021-10-31 NOTE — Telephone Encounter (Signed)
I agree with recommendation of taking medication with food.  Contact our office if her symtpoms do not improve.

## 2021-11-03 ENCOUNTER — Other Ambulatory Visit: Payer: Self-pay | Admitting: Family Medicine

## 2021-11-03 DIAGNOSIS — G6289 Other specified polyneuropathies: Secondary | ICD-10-CM

## 2021-11-23 ENCOUNTER — Other Ambulatory Visit: Payer: Self-pay

## 2021-11-23 DIAGNOSIS — M47812 Spondylosis without myelopathy or radiculopathy, cervical region: Secondary | ICD-10-CM

## 2021-11-23 DIAGNOSIS — M5441 Lumbago with sciatica, right side: Secondary | ICD-10-CM

## 2021-11-23 DIAGNOSIS — G629 Polyneuropathy, unspecified: Secondary | ICD-10-CM

## 2021-11-23 DIAGNOSIS — M160 Bilateral primary osteoarthritis of hip: Secondary | ICD-10-CM

## 2021-11-23 DIAGNOSIS — G5731 Lesion of lateral popliteal nerve, right lower limb: Secondary | ICD-10-CM

## 2021-11-23 DIAGNOSIS — M170 Bilateral primary osteoarthritis of knee: Secondary | ICD-10-CM

## 2021-11-23 DIAGNOSIS — M48062 Spinal stenosis, lumbar region with neurogenic claudication: Secondary | ICD-10-CM

## 2021-11-23 DIAGNOSIS — G8929 Other chronic pain: Secondary | ICD-10-CM

## 2021-11-23 DIAGNOSIS — M4804 Spinal stenosis, thoracic region: Secondary | ICD-10-CM

## 2021-11-23 DIAGNOSIS — G959 Disease of spinal cord, unspecified: Secondary | ICD-10-CM

## 2021-11-23 DIAGNOSIS — M159 Polyosteoarthritis, unspecified: Secondary | ICD-10-CM

## 2021-11-23 MED ORDER — HYDROCODONE-ACETAMINOPHEN 10-325 MG PO TABS: 1.0000 | ORAL_TABLET | Freq: Four times a day (QID) | ORAL | 0 refills | Status: DC | PRN

## 2021-11-23 NOTE — Telephone Encounter (Signed)
Patient's daughter calls nurse line regarding request for pain medication refill.   Daughter reports that generic medication breaks down faster in patient's mouth and does not work as well. They are requesting name brand medication to be sent to the pharmacy.   Please advise.   Talbot Grumbling, RN

## 2021-11-27 ENCOUNTER — Other Ambulatory Visit: Payer: Self-pay

## 2021-11-27 DIAGNOSIS — M159 Polyosteoarthritis, unspecified: Secondary | ICD-10-CM

## 2021-11-27 DIAGNOSIS — M170 Bilateral primary osteoarthritis of knee: Secondary | ICD-10-CM

## 2021-11-27 DIAGNOSIS — M47812 Spondylosis without myelopathy or radiculopathy, cervical region: Secondary | ICD-10-CM

## 2021-11-27 DIAGNOSIS — G5731 Lesion of lateral popliteal nerve, right lower limb: Secondary | ICD-10-CM

## 2021-11-27 DIAGNOSIS — M48062 Spinal stenosis, lumbar region with neurogenic claudication: Secondary | ICD-10-CM

## 2021-11-27 DIAGNOSIS — M4804 Spinal stenosis, thoracic region: Secondary | ICD-10-CM

## 2021-11-27 DIAGNOSIS — G629 Polyneuropathy, unspecified: Secondary | ICD-10-CM

## 2021-11-27 DIAGNOSIS — G8929 Other chronic pain: Secondary | ICD-10-CM

## 2021-11-27 DIAGNOSIS — G959 Disease of spinal cord, unspecified: Secondary | ICD-10-CM

## 2021-11-27 DIAGNOSIS — M160 Bilateral primary osteoarthritis of hip: Secondary | ICD-10-CM

## 2021-11-28 NOTE — Telephone Encounter (Signed)
Patient's daughter returns call to nurse line regarding issues with rx. Called pharmacy. The pharmacy does not have stock of name brand Norco.   Called [facility] on [address]. They also do not have name brand Norco.   Called patient's daughter. She is requesting that generic be resent to [facility] on [address]. New rx is needed, as current states that brand name needs to be dispensed. The original rx has been canceled at the pharmacy. Please resend to [facility] on [address].   Talbot Grumbling, RN

## 2021-11-28 NOTE — Telephone Encounter (Signed)
Patient's daughter returns call to nurse line. Daughter is requesting increased quantity as patient feels like they are not working as well.   Please advise.   Talbot Grumbling, RN

## 2021-11-29 NOTE — Telephone Encounter (Signed)
Patient's daughter returns call to nurse line. Advised that quantity would not be able to be increased without appointment. Daughter states that she will call back to schedule.   Please resend rx for generic norco to [facility] on [address].   Talbot Grumbling, RN

## 2021-11-30 MED ORDER — HYDROCODONE-ACETAMINOPHEN 10-325 MG PO TABS: 1.0000 | ORAL_TABLET | Freq: Four times a day (QID) | ORAL | 0 refills | Status: DC | PRN

## 2021-12-25 DIAGNOSIS — R339 Retention of urine, unspecified: Secondary | ICD-10-CM | POA: Diagnosis not present

## 2021-12-27 ENCOUNTER — Other Ambulatory Visit: Payer: Self-pay

## 2021-12-27 DIAGNOSIS — M48062 Spinal stenosis, lumbar region with neurogenic claudication: Secondary | ICD-10-CM

## 2021-12-27 DIAGNOSIS — M150 Primary generalized (osteo)arthritis: Secondary | ICD-10-CM

## 2021-12-27 DIAGNOSIS — G959 Disease of spinal cord, unspecified: Secondary | ICD-10-CM

## 2021-12-27 DIAGNOSIS — M160 Bilateral primary osteoarthritis of hip: Secondary | ICD-10-CM

## 2021-12-27 DIAGNOSIS — M170 Bilateral primary osteoarthritis of knee: Secondary | ICD-10-CM

## 2021-12-27 DIAGNOSIS — M4804 Spinal stenosis, thoracic region: Secondary | ICD-10-CM

## 2021-12-27 DIAGNOSIS — G8929 Other chronic pain: Secondary | ICD-10-CM

## 2021-12-27 DIAGNOSIS — N3020 Other chronic cystitis without hematuria: Secondary | ICD-10-CM

## 2021-12-27 DIAGNOSIS — M47812 Spondylosis without myelopathy or radiculopathy, cervical region: Secondary | ICD-10-CM

## 2021-12-27 DIAGNOSIS — M159 Polyosteoarthritis, unspecified: Secondary | ICD-10-CM

## 2021-12-27 DIAGNOSIS — G629 Polyneuropathy, unspecified: Secondary | ICD-10-CM

## 2021-12-27 DIAGNOSIS — G5731 Lesion of lateral popliteal nerve, right lower limb: Secondary | ICD-10-CM

## 2021-12-27 MED ORDER — HYDROCODONE-ACETAMINOPHEN 10-325 MG PO TABS: 1.0000 | ORAL_TABLET | Freq: Four times a day (QID) | ORAL | 0 refills | Status: DC | PRN

## 2021-12-27 MED ORDER — DOXYCYCLINE HYCLATE 100 MG PO TABS: ORAL_TABLET | ORAL | 3 refills | Status: DC

## 2022-01-26 ENCOUNTER — Other Ambulatory Visit: Payer: Self-pay

## 2022-01-26 DIAGNOSIS — M4804 Spinal stenosis, thoracic region: Secondary | ICD-10-CM

## 2022-01-26 DIAGNOSIS — G8929 Other chronic pain: Secondary | ICD-10-CM

## 2022-01-26 DIAGNOSIS — G629 Polyneuropathy, unspecified: Secondary | ICD-10-CM

## 2022-01-26 DIAGNOSIS — G959 Disease of spinal cord, unspecified: Secondary | ICD-10-CM

## 2022-01-26 DIAGNOSIS — M170 Bilateral primary osteoarthritis of knee: Secondary | ICD-10-CM

## 2022-01-26 DIAGNOSIS — M150 Primary generalized (osteo)arthritis: Secondary | ICD-10-CM

## 2022-01-26 DIAGNOSIS — M47812 Spondylosis without myelopathy or radiculopathy, cervical region: Secondary | ICD-10-CM

## 2022-01-26 DIAGNOSIS — N3020 Other chronic cystitis without hematuria: Secondary | ICD-10-CM

## 2022-01-26 DIAGNOSIS — G5731 Lesion of lateral popliteal nerve, right lower limb: Secondary | ICD-10-CM

## 2022-01-26 DIAGNOSIS — M160 Bilateral primary osteoarthritis of hip: Secondary | ICD-10-CM

## 2022-01-26 DIAGNOSIS — M48062 Spinal stenosis, lumbar region with neurogenic claudication: Secondary | ICD-10-CM

## 2022-01-26 DIAGNOSIS — M159 Polyosteoarthritis, unspecified: Secondary | ICD-10-CM

## 2022-01-26 MED ORDER — DOXYCYCLINE HYCLATE 100 MG PO TABS: ORAL_TABLET | ORAL | 3 refills | Status: DC

## 2022-01-26 MED ORDER — HYDROCODONE-ACETAMINOPHEN 10-325 MG PO TABS: 1.0000 | ORAL_TABLET | Freq: Four times a day (QID) | ORAL | 0 refills | Status: DC | PRN

## 2022-01-31 DIAGNOSIS — M792 Neuralgia and neuritis, unspecified: Secondary | ICD-10-CM | POA: Diagnosis not present

## 2022-01-31 DIAGNOSIS — M79671 Pain in right foot: Secondary | ICD-10-CM | POA: Diagnosis not present

## 2022-01-31 DIAGNOSIS — M79672 Pain in left foot: Secondary | ICD-10-CM | POA: Diagnosis not present

## 2022-02-09 ENCOUNTER — Other Ambulatory Visit: Payer: Self-pay | Admitting: Family Medicine

## 2022-02-09 DIAGNOSIS — G6289 Other specified polyneuropathies: Secondary | ICD-10-CM

## 2022-02-10 ENCOUNTER — Other Ambulatory Visit: Payer: Self-pay | Admitting: Family Medicine

## 2022-02-10 NOTE — Progress Notes (Signed)
x

## 2022-02-20 ENCOUNTER — Other Ambulatory Visit: Payer: Self-pay | Admitting: Family Medicine

## 2022-02-20 DIAGNOSIS — G5731 Lesion of lateral popliteal nerve, right lower limb: Secondary | ICD-10-CM

## 2022-02-20 DIAGNOSIS — M159 Polyosteoarthritis, unspecified: Secondary | ICD-10-CM

## 2022-02-20 DIAGNOSIS — G629 Polyneuropathy, unspecified: Secondary | ICD-10-CM

## 2022-02-20 DIAGNOSIS — M48062 Spinal stenosis, lumbar region with neurogenic claudication: Secondary | ICD-10-CM

## 2022-02-20 DIAGNOSIS — G8929 Other chronic pain: Secondary | ICD-10-CM

## 2022-02-20 DIAGNOSIS — M170 Bilateral primary osteoarthritis of knee: Secondary | ICD-10-CM

## 2022-02-20 DIAGNOSIS — M160 Bilateral primary osteoarthritis of hip: Secondary | ICD-10-CM

## 2022-02-20 DIAGNOSIS — M4804 Spinal stenosis, thoracic region: Secondary | ICD-10-CM

## 2022-02-20 DIAGNOSIS — M47812 Spondylosis without myelopathy or radiculopathy, cervical region: Secondary | ICD-10-CM

## 2022-02-20 DIAGNOSIS — G959 Disease of spinal cord, unspecified: Secondary | ICD-10-CM

## 2022-02-20 DIAGNOSIS — M5441 Lumbago with sciatica, right side: Secondary | ICD-10-CM

## 2022-02-21 ENCOUNTER — Other Ambulatory Visit: Payer: Self-pay

## 2022-02-21 NOTE — Telephone Encounter (Signed)
Patient's daughter returns call to nurse line to check status of rx refill.  ? ?Talbot Grumbling, RN ? ?

## 2022-02-21 NOTE — Telephone Encounter (Signed)
Opened in error.   Thomasina Housley C Jalyric Kaestner, RN  

## 2022-02-22 MED ORDER — HYDROCODONE-ACETAMINOPHEN 10-325 MG PO TABS: 1.0000 | ORAL_TABLET | Freq: Four times a day (QID) | ORAL | 0 refills | Status: DC | PRN

## 2022-02-22 NOTE — Telephone Encounter (Signed)
Please let daughter know Rx at pharmacy. Let me know if issues. ? ?Dorris Singh, MD  ?Family Medicine Teaching Service  ? ?

## 2022-02-22 NOTE — Telephone Encounter (Signed)
Attempted to reach patients daughter. No answer. LVM informing that Rx was sent to Pharmacy. Salvatore Marvel, CMA ? ?

## 2022-02-28 DIAGNOSIS — M19079 Primary osteoarthritis, unspecified ankle and foot: Secondary | ICD-10-CM | POA: Diagnosis not present

## 2022-02-28 DIAGNOSIS — M792 Neuralgia and neuritis, unspecified: Secondary | ICD-10-CM | POA: Diagnosis not present

## 2022-02-28 DIAGNOSIS — M2042 Other hammer toe(s) (acquired), left foot: Secondary | ICD-10-CM | POA: Diagnosis not present

## 2022-02-28 DIAGNOSIS — I739 Peripheral vascular disease, unspecified: Secondary | ICD-10-CM | POA: Diagnosis not present

## 2022-03-08 ENCOUNTER — Other Ambulatory Visit: Payer: Self-pay

## 2022-03-08 ENCOUNTER — Encounter: Payer: Self-pay | Admitting: Family Medicine

## 2022-03-08 ENCOUNTER — Ambulatory Visit (INDEPENDENT_AMBULATORY_CARE_PROVIDER_SITE_OTHER): Payer: Medicare Other | Admitting: Family Medicine

## 2022-03-08 VITALS — BP 150/68 | HR 68 | Ht 59.0 in | Wt 155.5 lb

## 2022-03-08 DIAGNOSIS — G959 Disease of spinal cord, unspecified: Secondary | ICD-10-CM

## 2022-03-08 DIAGNOSIS — M150 Primary generalized (osteo)arthritis: Secondary | ICD-10-CM

## 2022-03-08 DIAGNOSIS — M48062 Spinal stenosis, lumbar region with neurogenic claudication: Secondary | ICD-10-CM | POA: Diagnosis not present

## 2022-03-08 DIAGNOSIS — G894 Chronic pain syndrome: Secondary | ICD-10-CM

## 2022-03-08 DIAGNOSIS — I739 Peripheral vascular disease, unspecified: Secondary | ICD-10-CM | POA: Diagnosis not present

## 2022-03-08 DIAGNOSIS — M4804 Spinal stenosis, thoracic region: Secondary | ICD-10-CM

## 2022-03-08 DIAGNOSIS — G609 Hereditary and idiopathic neuropathy, unspecified: Secondary | ICD-10-CM | POA: Diagnosis not present

## 2022-03-08 DIAGNOSIS — G629 Polyneuropathy, unspecified: Secondary | ICD-10-CM

## 2022-03-08 DIAGNOSIS — G8929 Other chronic pain: Secondary | ICD-10-CM

## 2022-03-08 DIAGNOSIS — M160 Bilateral primary osteoarthritis of hip: Secondary | ICD-10-CM | POA: Diagnosis not present

## 2022-03-08 DIAGNOSIS — N319 Neuromuscular dysfunction of bladder, unspecified: Secondary | ICD-10-CM

## 2022-03-08 DIAGNOSIS — M159 Polyosteoarthritis, unspecified: Secondary | ICD-10-CM | POA: Diagnosis not present

## 2022-03-08 DIAGNOSIS — I10 Essential (primary) hypertension: Secondary | ICD-10-CM

## 2022-03-08 DIAGNOSIS — M5441 Lumbago with sciatica, right side: Secondary | ICD-10-CM | POA: Diagnosis not present

## 2022-03-08 DIAGNOSIS — M461 Sacroiliitis, not elsewhere classified: Secondary | ICD-10-CM | POA: Diagnosis not present

## 2022-03-08 DIAGNOSIS — M47812 Spondylosis without myelopathy or radiculopathy, cervical region: Secondary | ICD-10-CM

## 2022-03-08 DIAGNOSIS — M4715 Other spondylosis with myelopathy, thoracolumbar region: Secondary | ICD-10-CM

## 2022-03-08 DIAGNOSIS — F1190 Opioid use, unspecified, uncomplicated: Secondary | ICD-10-CM

## 2022-03-08 DIAGNOSIS — M170 Bilateral primary osteoarthritis of knee: Secondary | ICD-10-CM

## 2022-03-08 DIAGNOSIS — I700 Atherosclerosis of aorta: Secondary | ICD-10-CM

## 2022-03-08 MED ORDER — DOCUSATE SODIUM 100 MG PO CAPS: 100.0000 mg | ORAL_CAPSULE | Freq: Two times a day (BID) | ORAL | 0 refills | Status: DC

## 2022-03-08 MED ORDER — OXYCODONE HCL 5 MG PO TABS: 7.5000 mg | ORAL_TABLET | Freq: Four times a day (QID) | ORAL | 0 refills | Status: DC | PRN

## 2022-03-08 NOTE — Patient Instructions (Signed)
Lets try a higher potency pain medication than your hydrocodone. It is Oxycodone  mg tablet.  Take one and a half tablet every six hours if you need it for leg pain.   ? ?Dr Rachid Parham wants to see you back in 2 to 3 weeks to see how you are tolerating the new pain medicine and seeif it is enough pain medicine.   ? ?Your blood pressure is a little high and has been on several past visits.  I would like you to take two Losartan tablets at bedtime instead of one.  We will recheck your blood pressure at your next office visit. ? ?We are checking your hemoglobin and kidneys today.  ?

## 2022-03-09 ENCOUNTER — Encounter: Payer: Self-pay | Admitting: Family Medicine

## 2022-03-09 LAB — CBC
Hematocrit: 32.9 % — ABNORMAL LOW (ref 34.0–46.6)
Hemoglobin: 10.8 g/dL — ABNORMAL LOW (ref 11.1–15.9)
MCH: 28.7 pg (ref 26.6–33.0)
MCHC: 32.8 g/dL (ref 31.5–35.7)
MCV: 88 fL (ref 79–97)
Platelets: 220 10*3/uL (ref 150–450)
RBC: 3.76 x10E6/uL — ABNORMAL LOW (ref 3.77–5.28)
RDW: 13.5 % (ref 11.7–15.4)
WBC: 4.8 10*3/uL (ref 3.4–10.8)

## 2022-03-09 LAB — BASIC METABOLIC PANEL
BUN/Creatinine Ratio: 21 (ref 12–28)
BUN: 25 mg/dL (ref 8–27)
CO2: 24 mmol/L (ref 20–29)
Calcium: 9.2 mg/dL (ref 8.7–10.3)
Chloride: 104 mmol/L (ref 96–106)
Creatinine, Ser: 1.17 mg/dL — ABNORMAL HIGH (ref 0.57–1.00)
Glucose: 80 mg/dL (ref 70–99)
Potassium: 3.8 mmol/L (ref 3.5–5.2)
Sodium: 143 mmol/L (ref 134–144)
eGFR: 45 mL/min/{1.73_m2} — ABNORMAL LOW (ref >59)

## 2022-03-09 NOTE — Assessment & Plan Note (Signed)
Established problem Well Controlled. No signs of complications, medication side effects, or red flags. Continue current medications and other regiments.  

## 2022-03-09 NOTE — Progress Notes (Signed)
Nicole Perez is accompanied by daughter, [name]. ?Sources of clinical information for visit is/are patient and relative(s). ?Nursing assessment for this office visit was reviewed with the patient for accuracy and revision.  ? ? ? ?Previous Report(s) Reviewed: none  ? ?  03/08/2022  ?  9:41 AM  ?Depression screen PHQ 2/9  ?Decreased Interest 0  ?Down, Depressed, Hopeless 0  ?PHQ - 2 Score 0  ?Altered sleeping 0  ?Tired, decreased energy 0  ?Change in appetite 0  ?Feeling bad or failure about yourself  0  ?Trouble concentrating 0  ?Moving slowly or fidgety/restless 0  ?Suicidal thoughts 0  ?PHQ-9 Score 0  ? ?[place] Office Visit from 03/08/2022 in [place] Office Visit from 10/26/2021 in [place] Office Visit from 06/22/2021 in [place]  ?Thoughts that you would be better off dead, or of hurting yourself in some way Not at all Not at all Not at all  ?PHQ-9 Total Score 0 -- 3  ? ?  ?  ? ?  03/08/2022  ?  9:41 AM 03/08/2022  ?  9:33 AM 10/26/2021  ?  3:32 PM 06/22/2021  ? 11:03 AM 03/02/2021  ? 10:04 AM  ?Fall Risk   ?Falls in the past year? 0 0 0 0 0  ?Number falls in past yr: 0 0 0 0 0  ?Injury with Fall? 0 0 0 0 0  ? ? ? ?  03/08/2022  ?  9:41 AM 10/26/2021  ?  3:32 PM 06/22/2021  ? 11:03 AM  ?PHQ9 SCORE ONLY  ?PHQ-9 Total Score 0 1 3  ? ? ?Adult vaccines due  ?Topic Date Due  ? TETANUS/TDAP  07/10/2020  ? ? ?Health Maintenance Due  ?Topic Date Due  ? Zoster Vaccines- Shingrix (1 of 2) Never done  ? TETANUS/TDAP  07/10/2020  ?  ? ? ?History/P.E. limitations: hard of hearing  ? ?Adult vaccines due  ?Topic Date Due  ? TETANUS/TDAP  07/10/2020  ? There are no preventive care reminders to display for this patient.  ?Health Maintenance Due  ?Topic Date Due  ? Zoster Vaccines- Shingrix (1 of 2) Never done  ? TETANUS/TDAP  07/10/2020  ?  ? ?Chief Complaint  ?Patient presents with  ? Osteoarthritis  ?  ? ?

## 2022-03-09 NOTE — Assessment & Plan Note (Signed)
Established problem ?Uncontrolled ?See plan for peripheral neuropathy ? ?

## 2022-03-09 NOTE — Assessment & Plan Note (Signed)
Established problem ?Uncontrolled pain ?She went to see a Podiatrist who may have found abnormal screening lower extremity arteries. They said they would send her for further testing.   ?She and her daughter believe that the efficacy the used to receive from hydrocodone analgesics is no longer.  ? ?Start: trial of oxycodone 5 mg tablet, 1.5 tablet every six hours as needed for severe pain.  RTC 2-3 weeks to assess response and tolerance.  ?Stop: hydrocodone-APAP ?Continue gabapentin:  ? ?Can increase gabapentin in future, and add topical lidocaine gel or patches. Topical diclofenac can also be tried.  ? ?

## 2022-03-09 NOTE — Assessment & Plan Note (Addendum)
Established problem ?Await results of vascular testing by patient's podiatrist. ?Continue current treatments for now.  ? ?Re-address statin therapy at next office visit. ?

## 2022-03-09 NOTE — Assessment & Plan Note (Signed)
Established problem ?Change to oxycodone 7.5 mg q6h from hydrocodone-apap 10-325 q6h ?RTC 2-3 weeks to assess tolerance and efficacy. ? ?

## 2022-03-09 NOTE — Assessment & Plan Note (Signed)
Established problem. ?Stable. ?No signs of complications, medication side effects, or red flags. ?Nicole Perez stopped the Vesicare for bladder spasm leaking.  She is no longer having leaking.  ? ?

## 2022-03-19 DIAGNOSIS — M792 Neuralgia and neuritis, unspecified: Secondary | ICD-10-CM | POA: Diagnosis not present

## 2022-03-19 DIAGNOSIS — M2042 Other hammer toe(s) (acquired), left foot: Secondary | ICD-10-CM | POA: Diagnosis not present

## 2022-03-20 ENCOUNTER — Telehealth: Payer: Self-pay

## 2022-03-20 DIAGNOSIS — M159 Polyosteoarthritis, unspecified: Secondary | ICD-10-CM

## 2022-03-20 DIAGNOSIS — G894 Chronic pain syndrome: Secondary | ICD-10-CM

## 2022-03-20 DIAGNOSIS — M461 Sacroiliitis, not elsewhere classified: Secondary | ICD-10-CM

## 2022-03-20 DIAGNOSIS — M4715 Other spondylosis with myelopathy, thoracolumbar region: Secondary | ICD-10-CM

## 2022-03-20 DIAGNOSIS — G609 Hereditary and idiopathic neuropathy, unspecified: Secondary | ICD-10-CM

## 2022-03-20 DIAGNOSIS — F1190 Opioid use, unspecified, uncomplicated: Secondary | ICD-10-CM

## 2022-03-20 DIAGNOSIS — M4804 Spinal stenosis, thoracic region: Secondary | ICD-10-CM

## 2022-03-20 DIAGNOSIS — G8929 Other chronic pain: Secondary | ICD-10-CM

## 2022-03-20 DIAGNOSIS — M48062 Spinal stenosis, lumbar region with neurogenic claudication: Secondary | ICD-10-CM

## 2022-03-20 DIAGNOSIS — M47812 Spondylosis without myelopathy or radiculopathy, cervical region: Secondary | ICD-10-CM

## 2022-03-20 DIAGNOSIS — M170 Bilateral primary osteoarthritis of knee: Secondary | ICD-10-CM

## 2022-03-20 DIAGNOSIS — M160 Bilateral primary osteoarthritis of hip: Secondary | ICD-10-CM

## 2022-03-20 DIAGNOSIS — G959 Disease of spinal cord, unspecified: Secondary | ICD-10-CM

## 2022-03-20 MED ORDER — HYDROCODONE-ACETAMINOPHEN 10-325 MG PO TABS: 1.0000 | ORAL_TABLET | Freq: Three times a day (TID) | ORAL | 0 refills | Status: DC | PRN

## 2022-03-20 NOTE — Telephone Encounter (Signed)
Patients daughter calls nurse line reporting the recent Oxycodone prescription is making her dizzy. (Oxycodone '5mg'$  #63 dispensed 03/08/2022) ? ?Daughter is requesting the previous Hydrocodone medication she was on previously.  ? ?I asked daughter to bring Oxycodone '5mg'$  tablets up for destruction. I counted #49 and so did Jazmin. Will give to [facility] for destruction.  ? ?Will forward to PCP for advisement on Hydrocodone.  ? ? ?

## 2022-03-20 NOTE — Telephone Encounter (Signed)
Supplemental Norco 10-325 tablets #106 prescribed.  ?

## 2022-03-29 ENCOUNTER — Ambulatory Visit (INDEPENDENT_AMBULATORY_CARE_PROVIDER_SITE_OTHER): Payer: Medicare Other | Admitting: Family Medicine

## 2022-03-29 ENCOUNTER — Encounter: Payer: Self-pay | Admitting: Family Medicine

## 2022-03-29 VITALS — BP 157/70 | HR 66 | Ht 59.0 in | Wt 153.4 lb

## 2022-03-29 DIAGNOSIS — I739 Peripheral vascular disease, unspecified: Secondary | ICD-10-CM

## 2022-03-29 DIAGNOSIS — M461 Sacroiliitis, not elsewhere classified: Secondary | ICD-10-CM | POA: Diagnosis not present

## 2022-03-29 DIAGNOSIS — G894 Chronic pain syndrome: Secondary | ICD-10-CM

## 2022-03-29 DIAGNOSIS — M48062 Spinal stenosis, lumbar region with neurogenic claudication: Secondary | ICD-10-CM | POA: Diagnosis not present

## 2022-03-29 DIAGNOSIS — M4804 Spinal stenosis, thoracic region: Secondary | ICD-10-CM

## 2022-03-29 DIAGNOSIS — M160 Bilateral primary osteoarthritis of hip: Secondary | ICD-10-CM

## 2022-03-29 DIAGNOSIS — M159 Polyosteoarthritis, unspecified: Secondary | ICD-10-CM | POA: Diagnosis not present

## 2022-03-29 DIAGNOSIS — M47812 Spondylosis without myelopathy or radiculopathy, cervical region: Secondary | ICD-10-CM

## 2022-03-29 DIAGNOSIS — F1190 Opioid use, unspecified, uncomplicated: Secondary | ICD-10-CM

## 2022-03-29 DIAGNOSIS — M5441 Lumbago with sciatica, right side: Secondary | ICD-10-CM

## 2022-03-29 DIAGNOSIS — G63 Polyneuropathy in diseases classified elsewhere: Secondary | ICD-10-CM | POA: Diagnosis not present

## 2022-03-29 DIAGNOSIS — M170 Bilateral primary osteoarthritis of knee: Secondary | ICD-10-CM

## 2022-03-29 DIAGNOSIS — G609 Hereditary and idiopathic neuropathy, unspecified: Secondary | ICD-10-CM

## 2022-03-29 DIAGNOSIS — G959 Disease of spinal cord, unspecified: Secondary | ICD-10-CM | POA: Diagnosis not present

## 2022-03-29 DIAGNOSIS — G6289 Other specified polyneuropathies: Secondary | ICD-10-CM

## 2022-03-29 DIAGNOSIS — M4715 Other spondylosis with myelopathy, thoracolumbar region: Secondary | ICD-10-CM

## 2022-03-29 DIAGNOSIS — G8929 Other chronic pain: Secondary | ICD-10-CM

## 2022-03-29 MED ORDER — GABAPENTIN 300 MG PO CAPS: 300.0000 mg | ORAL_CAPSULE | Freq: Three times a day (TID) | ORAL | 3 refills | Status: DC

## 2022-03-29 NOTE — Patient Instructions (Addendum)
Your blood pressure still is high. ? ?Remember to take two Losartan tablets at night, ?Continue taking your HCTZ (Hydrochlorothiazide) daily ? ?Let Dr Tarin Navarez know about a week before your pain medication runs out so that Dr Yatziry Deakins will refill it in time.  ? ?Once Dr Shelvy Heckert sees the tests from your foot and ankle doctor, he will send you a message in [place] to let you know what he thinks the test means.  ? ? ?

## 2022-03-30 ENCOUNTER — Encounter: Payer: Self-pay | Admitting: Family Medicine

## 2022-03-30 DIAGNOSIS — M19071 Primary osteoarthritis, right ankle and foot: Secondary | ICD-10-CM | POA: Insufficient documentation

## 2022-03-30 DIAGNOSIS — I739 Peripheral vascular disease, unspecified: Secondary | ICD-10-CM | POA: Insufficient documentation

## 2022-03-30 DIAGNOSIS — M19079 Primary osteoarthritis, unspecified ankle and foot: Secondary | ICD-10-CM | POA: Insufficient documentation

## 2022-03-30 DIAGNOSIS — M205X2 Other deformities of toe(s) (acquired), left foot: Secondary | ICD-10-CM | POA: Insufficient documentation

## 2022-03-30 NOTE — Assessment & Plan Note (Signed)
Established problem ?Uncontrolled pain ?See problem polyneuropathy for details of Rx management ?

## 2022-03-30 NOTE — Assessment & Plan Note (Addendum)
Per report of Ms Nicole Perez, the podiatrist who assessed arterial flow of legs because of pain, particularly in left leg, has made a referral to Vascular Surgery for management. ?Podiatry's records are not available in [place] ? ?Examination of feet today are cool, good cap refill, I thought palpable DPP.  No skin lesions.  ? ?ABI 02/28/2022 ?Left ABI = 0.75 ?Right ABI 0.85 ?

## 2022-03-30 NOTE — Assessment & Plan Note (Addendum)
Established problem ?Uncontrolled pain ?See problem polyneuropathy for details of Rx management ? ?

## 2022-03-30 NOTE — Progress Notes (Signed)
Nicole Perez is accompanied by daughter, Nicole Perez ?Sources of clinical information for visit is/are patient and relative(s). ?Nursing assessment for this office visit was reviewed with the patient for accuracy and revision.  ? ? ? ?Previous Report(s) Reviewed: Review of phone notes about intolerance of oxycodone and need to restart hydrocodone-apap  ? ?  03/29/2022  ?  9:32 AM  ?Depression screen PHQ 2/9  ?Decreased Interest 0  ?Down, Depressed, Hopeless 0  ?PHQ - 2 Score 0  ?Altered sleeping 0  ?Tired, decreased energy 1  ?Change in appetite 0  ?Feeling bad or failure about yourself  0  ?Trouble concentrating 0  ?Moving slowly or fidgety/restless 0  ?Suicidal thoughts 0  ?PHQ-9 Score 1  ? ?[place] Office Visit from 03/29/2022 in [place] Office Visit from 03/08/2022 in [place] Office Visit from 10/26/2021 in [place]  ?Thoughts that you would be better off dead, or of hurting yourself in some way Not at all Not at all Not at all  ?PHQ-9 Total Score 1 0 --  ? ?  ?  ? ?  03/08/2022  ?  9:41 AM 03/08/2022  ?  9:33 AM 10/26/2021  ?  3:32 PM 06/22/2021  ? 11:03 AM 03/02/2021  ? 10:04 AM  ?Fall Risk   ?Falls in the past year? 0 0 0 0 0  ?Number falls in past yr: 0 0 0 0 0  ?Injury with Fall? 0 0 0 0 0  ? ? ? ?  03/29/2022  ?  9:32 AM 03/08/2022  ?  9:41 AM 10/26/2021  ?  3:32 PM  ?PHQ9 SCORE ONLY  ?PHQ-9 Total Score 1 0 1  ? ? ?Adult vaccines due  ?Topic Date Due  ? TETANUS/TDAP  07/10/2020  ? ? ?Health Maintenance Due  ?Topic Date Due  ? TETANUS/TDAP  07/10/2020  ?  ?History/P.E. limitations: dementia and communication barrier Hearing loss ? ?Adult vaccines due  ?Topic Date Due  ? TETANUS/TDAP  07/10/2020  ? There are no preventive care reminders to display for this patient.  ?Health Maintenance Due  ?Topic Date Due  ? TETANUS/TDAP  07/10/2020  ?  ? ?Chief Complaint  ?Patient presents with  ? Leg Pain  ? Foot Swelling  ?  ? ? ?

## 2022-03-30 NOTE — Assessment & Plan Note (Signed)
Requested records form Antionette Poles 02/28/22 office visit , DPM ([facility]) ?XR Bil foot: degenerative changes in right midfoot. 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 ?

## 2022-03-30 NOTE — Assessment & Plan Note (Signed)
Established problem ?Uncontrolled pain ?See problem polyneuropathy for details of Rx management ? ?

## 2022-03-30 NOTE — Assessment & Plan Note (Signed)
Established problem ?Uncontrolled pain ?patient demonstrated intolerance of oxycodone.  She had sedation, and imbalance feelings even when she reduced it to half-tablet oxycodone (2.5 mg).  Restarted her prior Hydrocodone-apap 10-325 tablet, one tablet every 8 hours prn (it should have been every 6 hours prn) on 03/20/22 with Disp of #90 tablets. Oxycodone was stopped and 47 tablets were destroyed by our pharmacist of the initial 63 tablets of oxycodone 5 mg dispensed on 03/08/22. ?Continue Gabapentin with change in formulation to one 300 mg capsule three times a day rather than 100 mg caps x 3 tid.  ? ?

## 2022-04-02 ENCOUNTER — Other Ambulatory Visit: Payer: Self-pay

## 2022-04-02 DIAGNOSIS — I712 Thoracic aortic aneurysm, without rupture, unspecified: Secondary | ICD-10-CM

## 2022-04-02 DIAGNOSIS — I10 Essential (primary) hypertension: Secondary | ICD-10-CM

## 2022-04-03 MED ORDER — LOSARTAN POTASSIUM 25 MG PO TABS: 50.0000 mg | ORAL_TABLET | Freq: Every day | ORAL | 0 refills | Status: DC

## 2022-04-05 ENCOUNTER — Other Ambulatory Visit: Payer: Self-pay | Admitting: Family Medicine

## 2022-04-05 DIAGNOSIS — I10 Essential (primary) hypertension: Secondary | ICD-10-CM

## 2022-04-10 ENCOUNTER — Other Ambulatory Visit: Payer: Self-pay

## 2022-04-10 DIAGNOSIS — M160 Bilateral primary osteoarthritis of hip: Secondary | ICD-10-CM

## 2022-04-10 DIAGNOSIS — M48062 Spinal stenosis, lumbar region with neurogenic claudication: Secondary | ICD-10-CM

## 2022-04-10 DIAGNOSIS — M159 Polyosteoarthritis, unspecified: Secondary | ICD-10-CM

## 2022-04-10 DIAGNOSIS — G8929 Other chronic pain: Secondary | ICD-10-CM

## 2022-04-10 DIAGNOSIS — M4715 Other spondylosis with myelopathy, thoracolumbar region: Secondary | ICD-10-CM

## 2022-04-10 DIAGNOSIS — G959 Disease of spinal cord, unspecified: Secondary | ICD-10-CM

## 2022-04-10 DIAGNOSIS — M170 Bilateral primary osteoarthritis of knee: Secondary | ICD-10-CM

## 2022-04-10 DIAGNOSIS — M47812 Spondylosis without myelopathy or radiculopathy, cervical region: Secondary | ICD-10-CM

## 2022-04-10 DIAGNOSIS — F1190 Opioid use, unspecified, uncomplicated: Secondary | ICD-10-CM

## 2022-04-10 DIAGNOSIS — G894 Chronic pain syndrome: Secondary | ICD-10-CM

## 2022-04-10 DIAGNOSIS — M461 Sacroiliitis, not elsewhere classified: Secondary | ICD-10-CM

## 2022-04-10 DIAGNOSIS — M4804 Spinal stenosis, thoracic region: Secondary | ICD-10-CM

## 2022-04-10 MED ORDER — HYDROCODONE-ACETAMINOPHEN 10-325 MG PO TABS: 1.0000 | ORAL_TABLET | Freq: Four times a day (QID) | ORAL | 0 refills | Status: DC | PRN

## 2022-04-10 NOTE — Telephone Encounter (Signed)
Robin calls nurse line requesting pain medication refill.  ? ?Shirlean Mylar reports they did not receive "all of the pills" because insurance would not cover it.  ? ?I called the pharmacy and confirmed only #90 was dispensed on 4/4. Pharmacist reports no record of 4/13 prescription.  ? ?Will forward to PCP.  ?

## 2022-04-11 ENCOUNTER — Ambulatory Visit: Payer: Medicare Other | Admitting: Vascular Surgery

## 2022-04-11 ENCOUNTER — Encounter: Payer: Self-pay | Admitting: Vascular Surgery

## 2022-04-11 VITALS — BP 148/60 | HR 69 | Temp 98.2°F | Resp 18 | Ht 59.0 in | Wt 152.0 lb

## 2022-04-11 DIAGNOSIS — I739 Peripheral vascular disease, unspecified: Secondary | ICD-10-CM
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
## 2022-04-18 ENCOUNTER — Other Ambulatory Visit: Payer: Self-pay | Admitting: *Deleted

## 2022-04-18 DIAGNOSIS — I739 Peripheral vascular disease, unspecified: Secondary | ICD-10-CM

## 2022-04-18 DIAGNOSIS — R339 Retention of urine, unspecified: Secondary | ICD-10-CM | POA: Diagnosis not present

## 2022-04-20 DIAGNOSIS — M21371 Foot drop, right foot: Secondary | ICD-10-CM | POA: Diagnosis not present

## 2022-04-20 DIAGNOSIS — I739 Peripheral vascular disease, unspecified: Secondary | ICD-10-CM | POA: Diagnosis not present

## 2022-04-20 DIAGNOSIS — M19071 Primary osteoarthritis, right ankle and foot: Secondary | ICD-10-CM | POA: Diagnosis not present

## 2022-04-20 DIAGNOSIS — M2042 Other hammer toe(s) (acquired), left foot: Secondary | ICD-10-CM | POA: Diagnosis not present

## 2022-05-07 ENCOUNTER — Other Ambulatory Visit: Payer: Self-pay

## 2022-05-07 DIAGNOSIS — G8929 Other chronic pain: Secondary | ICD-10-CM

## 2022-05-07 DIAGNOSIS — M48062 Spinal stenosis, lumbar region with neurogenic claudication: Secondary | ICD-10-CM

## 2022-05-07 DIAGNOSIS — M160 Bilateral primary osteoarthritis of hip: Secondary | ICD-10-CM

## 2022-05-07 DIAGNOSIS — M4804 Spinal stenosis, thoracic region: Secondary | ICD-10-CM

## 2022-05-07 DIAGNOSIS — M461 Sacroiliitis, not elsewhere classified: Secondary | ICD-10-CM

## 2022-05-07 DIAGNOSIS — M47812 Spondylosis without myelopathy or radiculopathy, cervical region: Secondary | ICD-10-CM

## 2022-05-07 DIAGNOSIS — F1190 Opioid use, unspecified, uncomplicated: Secondary | ICD-10-CM

## 2022-05-07 DIAGNOSIS — G959 Disease of spinal cord, unspecified: Secondary | ICD-10-CM

## 2022-05-07 DIAGNOSIS — M4715 Other spondylosis with myelopathy, thoracolumbar region: Secondary | ICD-10-CM

## 2022-05-07 DIAGNOSIS — M170 Bilateral primary osteoarthritis of knee: Secondary | ICD-10-CM

## 2022-05-07 DIAGNOSIS — M159 Polyosteoarthritis, unspecified: Secondary | ICD-10-CM

## 2022-05-07 DIAGNOSIS — G894 Chronic pain syndrome: Secondary | ICD-10-CM

## 2022-05-08 ENCOUNTER — Emergency Department (HOSPITAL_BASED_OUTPATIENT_CLINIC_OR_DEPARTMENT_OTHER)
Admit: 2022-05-08 | Discharge: 2022-05-08 | Disposition: A | Discharge status: Home / Self Care | Payer: Medicare Other | Attending: Emergency Medicine | Admitting: Emergency Medicine

## 2022-05-08 ENCOUNTER — Emergency Department (HOSPITAL_COMMUNITY)
Admission: EM | Admit: 2022-05-08 | Discharge: 2022-05-08 | Disposition: A | Discharge status: Home / Self Care | Payer: Medicare Other | Attending: Emergency Medicine | Admitting: Emergency Medicine

## 2022-05-08 DIAGNOSIS — M79604 Pain in right leg: Secondary | ICD-10-CM | POA: Diagnosis not present

## 2022-05-08 DIAGNOSIS — I82401 Acute embolism and thrombosis of unspecified deep veins of right lower extremity: Secondary | ICD-10-CM | POA: Insufficient documentation

## 2022-05-08 DIAGNOSIS — R7989 Other specified abnormal findings of blood chemistry: Secondary | ICD-10-CM | POA: Insufficient documentation

## 2022-05-08 DIAGNOSIS — D649 Anemia, unspecified: Secondary | ICD-10-CM | POA: Diagnosis not present

## 2022-05-08 DIAGNOSIS — I824Z1 Acute embolism and thrombosis of unspecified deep veins of right distal lower extremity: Secondary | ICD-10-CM | POA: Diagnosis not present

## 2022-05-08 DIAGNOSIS — Z7901 Long term (current) use of anticoagulants: Secondary | ICD-10-CM | POA: Diagnosis not present

## 2022-05-08 DIAGNOSIS — M79661 Pain in right lower leg: Secondary | ICD-10-CM | POA: Diagnosis not present

## 2022-05-08 DIAGNOSIS — I825Z3 Chronic embolism and thrombosis of unspecified deep veins of distal lower extremity, bilateral: Secondary | ICD-10-CM | POA: Diagnosis not present

## 2022-05-08 DIAGNOSIS — E876 Hypokalemia: Secondary | ICD-10-CM | POA: Insufficient documentation

## 2022-05-08 DIAGNOSIS — D72829 Elevated white blood cell count, unspecified: Secondary | ICD-10-CM | POA: Insufficient documentation

## 2022-05-08 HISTORY — DX: Acute embolism and thrombosis of unspecified deep veins of right lower extremity: I82.401

## 2022-05-08 LAB — CBC WITH DIFFERENTIAL/PLATELET
Abs Immature Granulocytes: 0.01 10*3/uL (ref 0.00–0.07)
Basophils Absolute: 0 10*3/uL (ref 0.0–0.1)
Basophils Relative: 1 %
Eosinophils Absolute: 0.1 10*3/uL (ref 0.0–0.5)
Eosinophils Relative: 3 %
HCT: 34 % — ABNORMAL LOW (ref 36.0–46.0)
Hemoglobin: 11 g/dL — ABNORMAL LOW (ref 12.0–15.0)
Immature Granulocytes: 0 %
Lymphocytes Relative: 31 %
Lymphs Abs: 1.1 10*3/uL (ref 0.7–4.0)
MCH: 28.6 pg (ref 26.0–34.0)
MCHC: 32.4 g/dL (ref 30.0–36.0)
MCV: 88.3 fL (ref 80.0–100.0)
Monocytes Absolute: 0.3 10*3/uL (ref 0.1–1.0)
Monocytes Relative: 8 %
Neutro Abs: 2 10*3/uL (ref 1.7–7.7)
Neutrophils Relative %: 57 %
Platelets: 221 10*3/uL (ref 150–400)
RBC: 3.85 MIL/uL — ABNORMAL LOW (ref 3.87–5.11)
RDW: 13.2 % (ref 11.5–15.5)
WBC: 3.4 10*3/uL — ABNORMAL LOW (ref 4.0–10.5)
nRBC: 0 % (ref 0.0–0.2)

## 2022-05-08 LAB — BASIC METABOLIC PANEL
Anion gap: 8 (ref 5–15)
BUN: 33 mg/dL — ABNORMAL HIGH (ref 8–23)
CO2: 27 mmol/L (ref 22–32)
Calcium: 9.6 mg/dL (ref 8.9–10.3)
Chloride: 105 mmol/L (ref 98–111)
Creatinine, Ser: 1.12 mg/dL — ABNORMAL HIGH (ref 0.44–1.00)
GFR, Estimated: 48 mL/min — ABNORMAL LOW (ref >60)
Glucose, Bld: 104 mg/dL — ABNORMAL HIGH (ref 70–99)
Potassium: 3.1 mmol/L — ABNORMAL LOW (ref 3.5–5.1)
Sodium: 140 mmol/L (ref 135–145)

## 2022-05-08 MED ORDER — APIXABAN 5 MG PO TABS
10.0000 mg | ORAL_TABLET | Freq: Two times a day (BID) | ORAL | Status: DC
  Administered 2022-05-08: 10 mg via ORAL
  Filled 2022-05-08: qty 2

## 2022-05-08 MED ORDER — HYDROCODONE-ACETAMINOPHEN 10-325 MG PO TABS: 1.0000 | ORAL_TABLET | Freq: Four times a day (QID) | ORAL | 0 refills | Status: DC | PRN

## 2022-05-08 MED ORDER — POTASSIUM CHLORIDE CRYS ER 20 MEQ PO TBCR
40.0000 meq | EXTENDED_RELEASE_TABLET | Freq: Once | ORAL | Status: AC
  Administered 2022-05-08: 40 meq via ORAL
  Filled 2022-05-08: qty 2

## 2022-05-08 MED ORDER — HYDROCODONE-ACETAMINOPHEN 5-325 MG PO TABS
1.0000 | ORAL_TABLET | Freq: Once | ORAL | Status: AC
  Administered 2022-05-08: 1 via ORAL
  Filled 2022-05-08: qty 1

## 2022-05-08 MED ORDER — APIXABAN 5 MG PO TABS: ORAL_TABLET | ORAL | 0 refills | Status: DC

## 2022-05-08 MED ORDER — APIXABAN 5 MG PO TABS: 5.0000 mg | ORAL_TABLET | Freq: Two times a day (BID) | ORAL | Status: DC

## 2022-05-08 NOTE — Discharge Instructions (Addendum)
Like we discussed, you have been diagnosed with a blood clot in the right leg.  This is called a deep vein thrombosis or DVT.  You have been given a dose of Eliquis in the emergency department and I have sent an additional prescription to your pharmacy that you need to fill and begin taking tomorrow. This is a blood thinning medication that should help with your DVT.  Please keep in mind that this increases your risk of bleeding.    Dr. Claretha Cooper office is going to set up an appointment in about 2 weeks to follow-up with him.  Please call his office to confirm the appointment.  If you develop any new or worsening symptoms please come back to the emergency department.  Information on my medicine - ELIQUIS (apixaban)  This medication education was reviewed with me or my healthcare representative as part of my discharge preparation.  The pharmacist that spoke with me during my hospital stay was:  Marcene Corning, [facility]  Why was Eliquis prescribed for you? Eliquis was prescribed to treat blood clots that may have been found in the veins of your legs (deep vein thrombosis) or in your lungs (pulmonary embolism) and to reduce the risk of them occurring again.  What do You need to know about Eliquis ? The starting dose is 10 mg (two 5 mg tablets) taken TWICE daily for the FIRST SEVEN (7) DAYS, then on (enter date)  05/15/2022   the dose is reduced to ONE 5 mg tablet taken TWICE daily.  Eliquis may be taken with or without food.   Try to take the dose about the same time in the morning and in the evening. If you have difficulty swallowing the tablet whole please discuss with your pharmacist how to take the medication safely.  Take Eliquis exactly as prescribed and DO NOT stop taking Eliquis without talking to the doctor who prescribed the medication.  Stopping may increase your risk of developing a new blood clot.  Refill your prescription before you run out.  After discharge, you should have regular  check-up appointments with your healthcare provider that is prescribing your Eliquis.    What do you do if you miss a dose? If a dose of ELIQUIS is not taken at the scheduled time, take it as soon as possible on the same day and twice-daily administration should be resumed. The dose should not be doubled to make up for a missed dose.  Important Safety Information A possible side effect of Eliquis is bleeding. You should call your healthcare provider right away if you experience any of the following: Bleeding from an injury or your nose that does not stop. Unusual colored urine (red or dark brown) or unusual colored stools (red or black). Unusual bruising for unknown reasons. A serious fall or if you hit your head (even if there is no bleeding).  Some medicines may interact with Eliquis and might increase your risk of bleeding or clotting while on Eliquis. To help avoid this, consult your healthcare provider or pharmacist prior to using any new prescription or non-prescription medications, including herbals, vitamins, non-steroidal anti-inflammatory drugs (NSAIDs) and supplements.  This website has more information on Eliquis (apixaban): http://www.eliquis.com/eliquis/home

## 2022-05-08 NOTE — ED Provider Notes (Signed)
[facility]  EMERGENCY DEPARTMENT Provider Note   CSN: [id number] Arrival date & time: 05/08/22  1323     History  Chief Complaint  Patient presents with   Foot Pain   Foot Swelling    Nicole Perez is a 88 y.o. female.  HPI Patient is an 88 year old female with a complex medical history who presents to the emergency department for reevaluation of right leg pain.  States that has been ongoing for the past 2 months.  She states she has been evaluated by vascular surgery, a "vein specialist", as well as podiatry.  She has a history of neuropathy.  Reports pain and swelling in the right lower leg and foot.  No numbness.  No chest pain or shortness of breath.    Home Medications Prior to Admission medications   Medication Sig Start Date End Date Taking? Authorizing Provider  apixaban (ELIQUIS) 5 MG TABS tablet Take 2 tablets ('10mg'$ ) twice daily for 7 days, then 1 tablet ('5mg'$ ) twice daily 05/08/22  Yes Rayna Sexton, PA-C  bisacodyl 5 MG EC tablet Take 2 tablets (10 mg total) by mouth daily. 06/22/21   McDiarmid, Blane Ohara, MD  docusate sodium (COLACE) 100 MG capsule Take 1 capsule (100 mg total) by mouth 2 (two) times daily. 03/08/22   McDiarmid, Blane Ohara, MD  doxycycline (VIBRA-TABS) 100 MG tablet Take one tablet by mouth twice a day for 7 days. 01/26/22   McDiarmid, Blane Ohara, MD  gabapentin (NEURONTIN) 300 MG capsule Take 1 capsule (300 mg total) by mouth 3 (three) times daily. 03/29/22   McDiarmid, Blane Ohara, MD  hydrochlorothiazide (HYDRODIURIL) 25 MG tablet TAKE 1 TABLET(25 MG) BY MOUTH DAILY 04/06/22   McDiarmid, Blane Ohara, MD  HYDROcodone-acetaminophen (NORCO) 10-325 MG tablet Take 1 tablet by mouth every 6 (six) hours as needed for moderate pain. 05/08/22   McDiarmid, Blane Ohara, MD  losartan (COZAAR) 25 MG tablet Take 2 tablets (50 mg total) by mouth at bedtime. 04/03/22 07/02/22  McDiarmid, Blane Ohara, MD      Allergies    Ciprofloxacin, Nitrofurantoin, and Keflex [cephalexin]     Review of Systems   Review of Systems  All other systems reviewed and are negative. Ten systems reviewed and are negative for acute change, except as noted in the HPI.   Physical Exam Updated Vital Signs BP (!) 183/123   Pulse 71   Temp 97.6 F (36.4 C) (Oral)   Resp 14   LMP 12/17/1962   SpO2 96%  Physical Exam Vitals and nursing note reviewed.  Constitutional:      General: She is not in acute distress.    Appearance: Normal appearance. She is not ill-appearing, toxic-appearing or diaphoretic.  HENT:     Head: Normocephalic and atraumatic.     Right Ear: External ear normal.     Left Ear: External ear normal.     Nose: Nose normal.     Mouth/Throat:     Mouth: Mucous membranes are moist.     Pharynx: Oropharynx is clear. No oropharyngeal exudate or posterior oropharyngeal erythema.  Eyes:     Extraocular Movements: Extraocular movements intact.  Cardiovascular:     Rate and Rhythm: Normal rate and regular rhythm.     Pulses: Normal pulses.     Heart sounds: Normal heart sounds. No murmur heard.   No friction rub. No gallop.  Pulmonary:     Effort: Pulmonary effort is normal. No respiratory distress.     Breath sounds:  Normal breath sounds. No stridor. No wheezing, rhonchi or rales.  Abdominal:     General: Abdomen is flat.     Palpations: Abdomen is soft.     Tenderness: There is no abdominal tenderness.  Musculoskeletal:        General: Normal range of motion.     Cervical back: Normal range of motion and neck supple. No tenderness.     Right lower leg: Edema present.     Left lower leg: No edema.     Comments: Trace pedal edema noted in the right lower leg.  Palpable DP pulses noted bilaterally.  Distal sensation intact.  Right foot is slightly cool to the touch when compared to the left.  Skin:    General: Skin is warm and dry.  Neurological:     General: No focal deficit present.     Mental Status: She is alert and oriented to person, place, and time.   Psychiatric:        Mood and Affect: Mood normal.        Behavior: Behavior normal.   ED Results / Procedures / Treatments   Labs (all labs ordered are listed, but only abnormal results are displayed) Labs Reviewed  BASIC METABOLIC PANEL - Abnormal; Notable for the following components:      Result Value   Potassium 3.1 (*)    Glucose, Bld 104 (*)    BUN 33 (*)    Creatinine, Ser 1.12 (*)    GFR, Estimated 48 (*)    All other components within normal limits  CBC WITH DIFFERENTIAL/PLATELET - Abnormal; Notable for the following components:   WBC 3.4 (*)    RBC 3.85 (*)    Hemoglobin 11.0 (*)    HCT 34.0 (*)    All other components within normal limits    EKG None  Radiology VAS [place] LOWER EXTREMITY VENOUS (DVT) (7a-7p)  Result Date: 05/08/2022  Lower Venous DVT Study Patient Name:  Nicole Perez  Date of Exam:   05/08/2022 Medical Rec #: [id number]      Accession #:    [id number] Date of Birth: [birth year]      Patient Gender: F Patient Age:   89 years Exam Location:  [facility] Procedure:      VAS [place] LOWER EXTREMITY VENOUS (DVT) Referring Phys: HINA KHATRI --------------------------------------------------------------------------------  Indications: Right lower extremity pain.  Comparison Study: No prior study Performing Technologist: Maudry Mayhew MHA, RDMS, RVT, RDCS  Examination Guidelines: A complete evaluation includes B-mode imaging, spectral Doppler, color Doppler, and power Doppler as needed of all accessible portions of each vessel. Bilateral testing is considered an integral part of a complete examination. Limited examinations for reoccurring indications may be performed as noted. The reflux portion of the exam is performed with the patient in reverse Trendelenburg.  +---------+---------------+---------+-----------+----------+-----------------+ RIGHT    CompressibilityPhasicitySpontaneityPropertiesThrombus Aging     +---------+---------------+---------+-----------+----------+-----------------+ CFV      Full           Yes      Yes                                    +---------+---------------+---------+-----------+----------+-----------------+ SFJ      Full                                                           +---------+---------------+---------+-----------+----------+-----------------+  FV Prox  Full                                                           +---------+---------------+---------+-----------+----------+-----------------+ FV Mid   Full                                                           +---------+---------------+---------+-----------+----------+-----------------+ FV DistalFull                                                           +---------+---------------+---------+-----------+----------+-----------------+ PFV      Full                                                           +---------+---------------+---------+-----------+----------+-----------------+ POP      Full           Yes      Yes                                    +---------+---------------+---------+-----------+----------+-----------------+ PTV      Full                    No                   Age Indeterminate +---------+---------------+---------+-----------+----------+-----------------+ PERO     Full                    No                   Age Indeterminate +---------+---------------+---------+-----------+----------+-----------------+   +----+---------------+---------+-----------+----------+--------------+ LEFTCompressibilityPhasicitySpontaneityPropertiesThrombus Aging +----+---------------+---------+-----------+----------+--------------+ CFV Full           Yes      Yes                                 +----+---------------+---------+-----------+----------+--------------+     Summary: RIGHT: - Findings consistent with age indeterminate deep vein thrombosis  involving the right posterior tibial veins, and right peroneal veins. Incidental finding: occluded right posterior tibial artery and peroneal artery. - No cystic structure found in the popliteal fossa.  LEFT: - No evidence of common femoral vein obstruction.  *See table(s) above for measurements and observations. Electronically signed by Servando Snare MD on 05/08/2022 at 5:27:51 PM.    Final     Procedures Procedures   Medications Ordered in ED Medications  potassium chloride SA (KLOR-CON M) CR tablet 40 mEq (has no administration in time range)  HYDROcodone-acetaminophen (NORCO/VICODIN) 5-325 MG per tablet 1 tablet (1 tablet Oral Given 05/08/22 1847)   ED Course/ Medical Decision Making/ A&P Clinical Course as  of 05/08/22 [place] May 08, 2022  1753 Patient discussed with Dr. Donzetta Matters with vascular surgery who previously evaluated the patient.  He is going to evaluate the patient in the ED and provide further recommendations. [LJ]  [id number] Dr. Donzetta Matters evaluated the patient.  Agrees with anticoagulation.  He will have the patient follow-up in his office in 2 weeks for reevaluation. [LJ]    Clinical Course User Index [LJ] Rayna Sexton, PA-C                           Medical Decision Making Risk Prescription drug management.  Pt is a 88 y.o. female who presents to the emergency department due to right lower leg pain and swelling that began about 2 months ago.  Labs: CBC with RBCs of 3.85, white blood cells of 3.4, hemoglobin of 11, hematocrit 34. BMP with a potassium of 3.1, glucose of 104, BUN of 33, creatinine of 1.12, GFR 48.  Imaging: DVT ultrasound of the right leg shows findings consistent with age-indeterminate DVT involving the right posterior tibial veins, and right peroneal veins.  Incidental finding shows occluded right posterior tibial artery and peroneal artery.  No cystic structure found in the popliteal fossa.  In the left leg there is no evidence of common femoral vein  obstruction.  I, Rayna Sexton, PA-C, personally reviewed and evaluated these images and lab results as part of my medical decision-making.  On my exam patient has trace pedal edema in the right lower leg.  No palpable tenderness.  Palpable pedal pulses.  Ultrasound concerning for DVT in the right posterior tibial veins and right peroneal veins.  Patient has been evaluated by Dr. Donzetta Matters with vascular surgery before and he was consulted today.  He evaluated the patient and agrees with anticoagulation.  This was discussed with the patient as well as her daughter at bedside.  They are agreeable with this plan.  We discussed risks associated with anticoagulation.  Given her renal function will start patient on Eliquis.  First dose given in the emergency department.  Our pharmacy team educated the patient as well as her daughter on this medication.  Patient denying any chest pain or shortness of breath.  She is nontachycardic and nonhypoxic at this visit.  Doubt PE at this time.  Patient appears stable for discharge at this time and she and her daughter at bedside are agreeable.  We discussed return precautions at length.  They are going to follow-up with vascular surgery in 2 weeks for reevaluation.  Their questions were answered and they were amicable at the time of discharge.  Note: Portions of this report may have been transcribed using voice recognition software. Every effort was made to ensure accuracy; however, inadvertent computerized transcription errors may be present.   Final Clinical Impression(s) / ED Diagnoses Final diagnoses:  Deep vein thrombosis (DVT) of distal vein of right lower extremity, unspecified chronicity ([place])   Rx / DC Orders ED Discharge Orders          Ordered    apixaban (ELIQUIS) 5 MG TABS tablet        05/08/22 1840              Rayna Sexton, PA-C 05/08/22 1852    Isla Pence, MD 05/08/22 2008

## 2022-05-08 NOTE — Progress Notes (Signed)
Right lower extremity venous duplex completed. Refer to "CV Proc" under chart review to view preliminary results.  05/08/2022 3:03 PM Kelby Aline., MHA, RVT, RDCS, RDMS

## 2022-05-08 NOTE — H&P (Signed)
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 structure found in the popliteal fossa.     LEFT:  - No evidence of common femoral vein obstruction.    ASSESSMENT/PLAN: 88 y.o. female with persistent right lower extremity pain with new finding of age-indeterminate DVT of the right lower extremity tibial veins.  She does not appear to have an ischemic extremity particularly as her foot is sensorimotor intact and she does not have any pain there either.  Given the swelling in the right lower extremity with a new finding of DVT I discussed treating her with anticoagulation.  The family is in agreement with this.  I will set her up to see me in a few weeks to see if this is remedying her  issue we can also check ABIs and a right lower extremity arterial duplex at that time and consider angiography if she has considerable pain.  Is also possible that her pain is musculoskeletal in nature and will possibly require orthopedic evaluation in the near future.  Harsimran Westman C. Donzetta Matters, MD [facility] Office: [phone] Pager: [phone]

## 2022-05-08 NOTE — ED Triage Notes (Signed)
Pt. Stated, Ive had foot pain and swelling for 2 months. Ive went to a couple of Dr and they say its neroapathy

## 2022-05-08 NOTE — ED Provider Triage Note (Signed)
Emergency Medicine Provider Triage Evaluation Note  Nicole Perez , a 88 y.o. female  was evaluated in triage.  Pt complains of pain and swelling to her right lower extremity.  Symptoms have gradually worsened over the past 2 months.  She has seen vascular specialist for this.  No history of DVT.  No injuries.  Review of Systems  Positive: Leg pain and foot pain Negative: Chest pain  Physical Exam  BP (!) 148/116 (BP Location: Left Arm)   Pulse 74   Temp 98.4 F (36.9 C) (Oral)   Resp 17   LMP 12/17/1960   SpO2 97%  Gen:   Awake, no distress   Resp:  Normal effort  MSK:   Moves extremities without difficulty  Other:  No significant edema noted of the right lower extremity.  Doppler noted DP pulse on right side extremity is cold to the touch but patient states that it is "always like this"  Medical Decision Making  Medically screening exam initiated at 2:22 PM.  Appropriate orders placed.  Nicole Perez was informed that the remainder of the evaluation will be completed by another provider, this initial triage assessment does not replace that evaluation, and the importance of remaining in the ED until their evaluation is complete.  We will order ultrasound and labs   Delia Heady, PA-C 05/08/22 1423

## 2022-05-22 ENCOUNTER — Encounter: Payer: Self-pay | Admitting: *Deleted

## 2022-05-30 ENCOUNTER — Other Ambulatory Visit: Payer: Self-pay

## 2022-05-30 NOTE — Telephone Encounter (Signed)
Received phone call from [facility] nurse regarding patient needing refill of Eliquis.   Patient only has enough medication for today and will be out tomorrow.   Will forward to PCP for refill.   Talbot Grumbling, RN

## 2022-05-31 ENCOUNTER — Other Ambulatory Visit: Payer: Self-pay

## 2022-05-31 ENCOUNTER — Telehealth: Payer: Self-pay

## 2022-05-31 DIAGNOSIS — M150 Primary generalized (osteo)arthritis: Secondary | ICD-10-CM

## 2022-05-31 DIAGNOSIS — G894 Chronic pain syndrome: Secondary | ICD-10-CM

## 2022-05-31 DIAGNOSIS — M170 Bilateral primary osteoarthritis of knee: Secondary | ICD-10-CM

## 2022-05-31 DIAGNOSIS — M47812 Spondylosis without myelopathy or radiculopathy, cervical region: Secondary | ICD-10-CM

## 2022-05-31 DIAGNOSIS — M160 Bilateral primary osteoarthritis of hip: Secondary | ICD-10-CM

## 2022-05-31 DIAGNOSIS — M48062 Spinal stenosis, lumbar region with neurogenic claudication: Secondary | ICD-10-CM

## 2022-05-31 DIAGNOSIS — M461 Sacroiliitis, not elsewhere classified: Secondary | ICD-10-CM

## 2022-05-31 DIAGNOSIS — M159 Polyosteoarthritis, unspecified: Secondary | ICD-10-CM

## 2022-05-31 DIAGNOSIS — G8929 Other chronic pain: Secondary | ICD-10-CM

## 2022-05-31 DIAGNOSIS — F1190 Opioid use, unspecified, uncomplicated: Secondary | ICD-10-CM

## 2022-05-31 DIAGNOSIS — M4804 Spinal stenosis, thoracic region: Secondary | ICD-10-CM

## 2022-05-31 DIAGNOSIS — G959 Disease of spinal cord, unspecified: Secondary | ICD-10-CM

## 2022-05-31 DIAGNOSIS — M4715 Other spondylosis with myelopathy, thoracolumbar region: Secondary | ICD-10-CM

## 2022-05-31 MED ORDER — APIXABAN 5 MG PO TABS: 5.0000 mg | ORAL_TABLET | Freq: Two times a day (BID) | ORAL | 1 refills | Status: DC

## 2022-05-31 MED ORDER — HYDROCODONE-ACETAMINOPHEN 10-325 MG PO TABS: 1.0000 | ORAL_TABLET | Freq: Four times a day (QID) | ORAL | 0 refills | Status: DC | PRN

## 2022-05-31 NOTE — Telephone Encounter (Signed)
Patient's daughter calls nurse line requesting pain medication refill. Advised that refill is not due until 6/23. Daughter states that she was told to call one week in advance for medication.   Will forward to PCP.   Talbot Grumbling, RN

## 2022-05-31 NOTE — Telephone Encounter (Signed)
Pt's daughter called stating that the pt was almost out of her blood thinner and wanted to make sure a refill was sent in.  Returned call, two identifiers used. After review of the pt's chart, pt's PCP had sent in a refill on her Eliquis today, so the pt will not run out before she comes back in for her f/u appt on 7/5. Pt's daughter voiced understanding and stated her mother, the pt, must have called him and she didn't know about it.

## 2022-06-08 ENCOUNTER — Other Ambulatory Visit: Payer: Self-pay | Admitting: *Deleted

## 2022-06-08 DIAGNOSIS — I739 Peripheral vascular disease, unspecified: Secondary | ICD-10-CM

## 2022-06-12 ENCOUNTER — Telehealth: Payer: Self-pay
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
## 2022-06-14 ENCOUNTER — Telehealth: Payer: Self-pay

## 2022-06-14 ENCOUNTER — Ambulatory Visit (INDEPENDENT_AMBULATORY_CARE_PROVIDER_SITE_OTHER): Payer: Medicare Other | Admitting: Family Medicine

## 2022-06-14 DIAGNOSIS — I10 Essential (primary) hypertension: Secondary | ICD-10-CM | POA: Diagnosis not present

## 2022-06-14 DIAGNOSIS — R051 Acute cough: Secondary | ICD-10-CM | POA: Diagnosis not present

## 2022-06-14 DIAGNOSIS — R059 Cough, unspecified: Secondary | ICD-10-CM | POA: Insufficient documentation

## 2022-06-14 MED ORDER — HYDROCHLOROTHIAZIDE 25 MG PO TABS: ORAL_TABLET | ORAL | 3 refills | Status: DC

## 2022-06-14 NOTE — Telephone Encounter (Signed)
Patient's daughter calls nurse line requesting refill on albuterol inhaler. This is not on current medication list.   Patient's daughter reports that patient has been having cold symptoms for approx 3 days. Reports that she has had a bad cough in the evenings.   Daughter requesting appointment for today. Scheduled for this afternoon with Dr. Larae Grooms.   Talbot Grumbling, RN

## 2022-06-14 NOTE — Assessment & Plan Note (Signed)
-  BP 169/57, repeat 136/55 -continue HCTZ daily, refills provided  -instructed to avoid OTC cough meds as these can increase BP -ED precautions discussed  -follow up with PCP as appropriate

## 2022-06-14 NOTE — Assessment & Plan Note (Signed)
-  acute cough likely secondary to viral etiology. No signs of otitis media, pneumonia or other possible bacterial etiology.  -supportive care discussed, instructed to drink warm fluids and honey for cough -follow up in 1 week if symptoms persist, may consider antibiotics at that time but no indication for antibiotics at this time

## 2022-06-14 NOTE — Patient Instructions (Signed)
It was great seeing you today!  I am sorry that you are not feeling well. It seems that you have a viral infection, this should clear up on its own. Please make sure to drink 6-8 glasses of water and get plenty of rest. You do not need antibiotics at this time however if your symptoms continue for a week then please schedule another appointment to get reevaluated.   Your blood pressure is high and most likely from congestion. Please make sure to take your hydrochlorothiazide daily. If you experience chest pain, shortness of breath, weakness or vision changes then please go to the emergency department.   Please follow up at your next scheduled appointment, if anything arises between now and then, please don't hesitate to contact our office.   Thank you for allowing [place] to be a part of your medical care!  Thank you, Dr. Larae Grooms

## 2022-06-14 NOTE — Progress Notes (Signed)
    SUBJECTIVE:   CHIEF COMPLAINT / HPI:   Patient presents with cough, she is accompanied by daughter. Started to have a cough about 3 days ago, cough with yellowish clear phlegm. Denies fever, headache, nausea, vomiting, dyspnea and dysuria. Endorsing chills and fatigue. Denies any sick contacts. Received COVID vaccine. The cough is her main issue today.   Denies chest pain, dyspnea and leg swelling. Typically blood pressures at home have been within normal range per daughter. Last one was 123/70. Compliant on HCTZ daily. Losartan was discontinued by her PCP a few months ago and she was doing well. Denies vision changes, headache and weakness.   OBJECTIVE:   BP (!) 136/55   Pulse 85   Ht '4\' 11"'$  (1.499 m)   Wt 144 lb (65.3 kg)   LMP 12/17/1962   SpO2 96%   BMI 29.08 kg/m   General: Patient tired appearing, in no acute distress. HEENT: PERRLA, presence of cervical LAD, moist mucous membranes, no erythema or tonsillar exudate noted, non-bulging TM bilaterally without drainage or erythema  CV: RRR, no murmurs or gallops auscultated  Resp: CTAB, no wheezing, rales or rhonchi noted, congestion noted, good air movement throughout all lung fields without focal findings Abdomen: soft, nontender, nondistended, presence of bowel sounds Ext: radial pulses strong and equal bilaterally, capillary refill less than 2 sec, no LE edema noted bilaterally  ASSESSMENT/PLAN:   Cough -acute cough likely secondary to viral etiology. No signs of otitis media, pneumonia or other possible bacterial etiology.  -supportive care discussed, instructed to drink warm fluids and honey for cough -follow up in 1 week if symptoms persist, may consider antibiotics at that time but no indication for antibiotics at this time  Essential hypertension, benign -BP 169/57, repeat 136/55 -continue HCTZ daily, refills provided  -instructed to avoid OTC cough meds as these can increase BP -ED precautions discussed  -follow  up with PCP as appropriate    -PHQ-9 score of 2 with negative question 9 reviewed.    Donney Dice, [place]

## 2022-06-16 ENCOUNTER — Other Ambulatory Visit: Payer: Self-pay | Admitting: Family Medicine

## 2022-06-16 DIAGNOSIS — I712 Thoracic aortic aneurysm, without rupture, unspecified: Secondary | ICD-10-CM

## 2022-06-16 DIAGNOSIS — I10 Essential (primary) hypertension: Secondary | ICD-10-CM

## 2022-06-20 ENCOUNTER — Telehealth: Payer: Self-pay

## 2022-06-20 ENCOUNTER — Encounter (HOSPITAL_COMMUNITY): Payer: Medicare Other

## 2022-06-20 ENCOUNTER — Ambulatory Visit: Payer: Medicare Other

## 2022-06-20 NOTE — Telephone Encounter (Signed)
Pt's daughter, [name], called stating that she had to reschedule her mother's appt d/t a bad cold. She wanted to clarify her anticoagulant prescription as well.  Reviewed pt's chart, returned call, two identifiers used. Clarified Eliquis prescription. Pt has been taking as prescribed. Pt to have f/u appt tomorrow, 7/6, with PCP. Will ask for more refills then. Confirmed understanding.

## 2022-06-21 ENCOUNTER — Encounter: Payer: Self-pay | Admitting: Family Medicine

## 2022-06-21 ENCOUNTER — Ambulatory Visit (INDEPENDENT_AMBULATORY_CARE_PROVIDER_SITE_OTHER): Payer: Medicare Other | Admitting: Family Medicine

## 2022-06-21 VITALS — BP 108/60 | Wt 144.0 lb

## 2022-06-21 DIAGNOSIS — R052 Subacute cough: Secondary | ICD-10-CM

## 2022-06-21 DIAGNOSIS — J441 Chronic obstructive pulmonary disease with (acute) exacerbation: Secondary | ICD-10-CM | POA: Diagnosis not present

## 2022-06-21 DIAGNOSIS — I82461 Acute embolism and thrombosis of right calf muscular vein: Secondary | ICD-10-CM | POA: Diagnosis not present

## 2022-06-21 DIAGNOSIS — J22 Unspecified acute lower respiratory infection: Secondary | ICD-10-CM | POA: Diagnosis not present

## 2022-06-21 DIAGNOSIS — Z79899 Other long term (current) drug therapy: Secondary | ICD-10-CM

## 2022-06-21 DIAGNOSIS — J449 Chronic obstructive pulmonary disease, unspecified: Secondary | ICD-10-CM | POA: Insufficient documentation

## 2022-06-21 DIAGNOSIS — R634 Abnormal weight loss: Secondary | ICD-10-CM | POA: Diagnosis not present

## 2022-06-21 DIAGNOSIS — J439 Emphysema, unspecified: Secondary | ICD-10-CM | POA: Diagnosis not present

## 2022-06-21 DIAGNOSIS — M19071 Primary osteoarthritis, right ankle and foot: Secondary | ICD-10-CM | POA: Insufficient documentation

## 2022-06-21 DIAGNOSIS — I739 Peripheral vascular disease, unspecified: Secondary | ICD-10-CM | POA: Insufficient documentation

## 2022-06-21 MED ORDER — AMOXICILLIN-POT CLAVULANATE 875-125 MG PO TABS: 1.0000 | ORAL_TABLET | Freq: Two times a day (BID) | ORAL | 0 refills | Status: DC

## 2022-06-21 MED ORDER — PREDNISONE 20 MG PO TABS: 40.0000 mg | ORAL_TABLET | Freq: Every day | ORAL | 0 refills | Status: DC

## 2022-06-21 NOTE — Assessment & Plan Note (Signed)
Continue Apixaban F/U with [facility]

## 2022-06-21 NOTE — Patient Instructions (Addendum)
Please start the antibiotic called Augmentin - take one tablet twice a day for 5 days Please start Prednisone 20 mg tablets, two tablets every day for 5 days.  This is to help bring down the inflammation in your lungs and should help decrease your coughing.   Please try to drink two cans of Ensure or Boost a day.  Stop taking your HCTZ and Losartan blood pressure medications.  Please go get a Chest Xray today.

## 2022-06-21 NOTE — Progress Notes (Signed)
Nicole Perez is accompanied by daughter Sources of clinical information for visit is/are patient and relative(s). Nursing assessment for this office visit was reviewed with the patient for accuracy and revision.     Previous Report(s) Reviewed: ER records 05/08/22 and office notes 06/14/22 and Telephone messages with [facility]    06/14/2022    3:52 PM  Depression screen PHQ 2/9  Decreased Interest 0  Down, Depressed, Hopeless 0  PHQ - 2 Score 0  Altered sleeping 0  Tired, decreased energy 0  Change in appetite 0  Feeling bad or failure about yourself  0  Trouble concentrating 0  Moving slowly or fidgety/restless 0  Suicidal thoughts 0  PHQ-9 Score 0   [place] Office Visit from 06/14/2022 in [place] Office Visit from 03/29/2022 in [place] Office Visit from 03/08/2022 in [place]  Thoughts that you would be better off dead, or of hurting yourself in some way Not at all Not at all Not at all  PHQ-9 Total Score 0 1 0          06/14/2022    3:52 PM 03/08/2022    9:41 AM 03/08/2022    9:33 AM 10/26/2021    3:32 PM 06/22/2021   11:03 AM  Fall Risk   Falls in the past year? 0 0 0 0 0  Number falls in past yr: 0 0 0 0 0  Injury with Fall? 0 0 0 0 0       06/14/2022    3:52 PM 03/29/2022    9:32 AM 03/08/2022    9:41 AM  PHQ9 SCORE ONLY  PHQ-9 Total Score 0 1 0    Adult vaccines due  Topic Date Due   TETANUS/TDAP  07/10/2020    Health Maintenance Due  Topic Date Due   TETANUS/TDAP  07/10/2020   Zoster Vaccines- Shingrix (2 of 2) 04/03/2022      History/P.E. limitations: communication barrier HOH  Adult vaccines due  Topic Date Due   TETANUS/TDAP  07/10/2020   There are no preventive care reminders to display for this patient.  Health Maintenance Due  Topic Date Due   TETANUS/TDAP  07/10/2020   Zoster Vaccines- Shingrix (2 of 2) 04/03/2022     Chief Complaint  Patient presents  with   Hospitalization Follow-up

## 2022-06-21 NOTE — Assessment & Plan Note (Addendum)
Established problem worsened.   - Onset: Two weeks ago - Course of symptoms: unchanged-  - Fever: absent - Rigors: no - Soaking sweats: no sweating but "clammy" - Cough:  productive of yellow sputum and thick mucus plugs    - Shortness of Breath: mild shortness of breath - What does breathlessness Keep you from doing?  none  - Comorbidities: Essential (primary) hypertension - Immunostatus:competent  Evaluated [facility] 6/29 - diagnosis viral URI - treatment recommendation was supportive care Severity: interfering with sleep.     sweaty Sick contacts: no    No leg edema  Weight Loss:  yes, down 8 pounds since late April  PMH Asthma or COPD: yes, emphysematous lung changes on Chest Ct 2021. Diagnosed with right distal DVT 05/08/22 PMH of Smoking: yes  Using ACEIs: no     SH:  Social History   Tobacco Use  Smoking Status Former  . Packs/day: 0.25  . Years: 20.00  . Total pack years: 5.00  . Types: Cigarettes  . Quit date: 01/14/1984  . Years since quitting: 38.4  Smokeless Tobacco Former    ROS: See HPI  PHYSICAL EXAM Vitals:   06/21/22 1107  BP: 108/60  SpO2: 97%   Today's Vitals   06/21/22 1107  BP: 108/60  SpO2: 97%  Weight: 144 lb (65.3 kg)  105/78 P88 reg Body mass index is 29.08 kg/m.  General: alert HEENT: Head: Normocephalic, no lesions, without obvious abnormality. Cor: Heart sounds are normal.  Regular rate and rhythm without murmur, gallop or rub. Lungs: clear to auscultation left, decreased breath sounds right posterior field Ext: no edema of right leg.    Labs/Imaging: CXR pending    Assessment/Plan   Working diagnosis: AE-COPD complicated with lower SBP and weight loss Differential includes: Postviral cought, PN drip, Pneumonia  PlaN Hold HCTZ and Losartan BBMET and CBC CXR Prescription Augmentin x 5 day Prescription Prednisone 40 mg x 5day

## 2022-06-22 ENCOUNTER — Ambulatory Visit
Admission: RE | Admit: 2022-06-22 | Discharge: 2022-06-22 | Disposition: A | Discharge status: Home / Self Care | Payer: Medicare Other | Source: Ambulatory Visit | Attending: Family Medicine | Admitting: Family Medicine

## 2022-06-22 ENCOUNTER — Telehealth: Payer: Self-pay | Admitting: Family Medicine

## 2022-06-22 DIAGNOSIS — R052 Subacute cough: Secondary | ICD-10-CM

## 2022-06-22 DIAGNOSIS — R059 Cough, unspecified: Secondary | ICD-10-CM | POA: Diagnosis not present

## 2022-06-22 LAB — BASIC METABOLIC PANEL
BUN/Creatinine Ratio: 27 (ref 12–28)
BUN: 39 mg/dL — ABNORMAL HIGH (ref 8–27)
CO2: 23 mmol/L (ref 20–29)
Calcium: 9 mg/dL (ref 8.7–10.3)
Chloride: 100 mmol/L (ref 96–106)
Creatinine, Ser: 1.43 mg/dL — ABNORMAL HIGH (ref 0.57–1.00)
Glucose: 115 mg/dL — ABNORMAL HIGH (ref 70–99)
Potassium: 3.9 mmol/L (ref 3.5–5.2)
Sodium: 141 mmol/L (ref 134–144)
eGFR: 35 mL/min/{1.73_m2} — ABNORMAL LOW (ref >59)

## 2022-06-22 LAB — CBC WITH DIFFERENTIAL/PLATELET
Basophils Absolute: 0.1 10*3/uL (ref 0.0–0.2)
Basos: 1 %
EOS (ABSOLUTE): 0.1 10*3/uL (ref 0.0–0.4)
Eos: 2 %
Hematocrit: 28.8 % — ABNORMAL LOW (ref 34.0–46.6)
Hemoglobin: 9.5 g/dL — ABNORMAL LOW (ref 11.1–15.9)
Immature Grans (Abs): 0.1 10*3/uL (ref 0.0–0.1)
Immature Granulocytes: 1 %
Lymphocytes Absolute: 1.3 10*3/uL (ref 0.7–3.1)
Lymphs: 16 %
MCH: 28.3 pg (ref 26.6–33.0)
MCHC: 33 g/dL (ref 31.5–35.7)
MCV: 86 fL (ref 79–97)
Monocytes Absolute: 0.6 10*3/uL (ref 0.1–0.9)
Monocytes: 7 %
Neutrophils Absolute: 6 10*3/uL (ref 1.4–7.0)
Neutrophils: 73 %
Platelets: 371 10*3/uL (ref 150–450)
RBC: 3.36 x10E6/uL — ABNORMAL LOW (ref 3.77–5.28)
RDW: 13.1 % (ref 11.7–15.4)
WBC: 8.2 10*3/uL (ref 3.4–10.8)

## 2022-06-22 NOTE — Telephone Encounter (Signed)
Patient's daughter returns call to nurse line. Daughter took patient and received CXR today.   Reports that appetite has improved. Denies fever.   Informed of message from Dr. Wendy Poet. Daughter is requesting that we schedule follow up appointment on Monday to ensure that patient is improving.   Scheduled with Dr. Owens Shark for 8:30 on Monday morning.   ED precautions given. Supportive measures provided.   Talbot Grumbling, RN

## 2022-06-22 NOTE — Telephone Encounter (Signed)
Left message about relatively normal CBC and Basic Metabolic Panel.  Slight decrease in Hemoglobin and slight increase in serum creatinine.  Encourgaed pushing fluids Let us know if she worsens or is not improving by Monday.

## 2022-06-24 ENCOUNTER — Encounter (HOSPITAL_BASED_OUTPATIENT_CLINIC_OR_DEPARTMENT_OTHER): Payer: Self-pay

## 2022-06-24 ENCOUNTER — Inpatient Hospital Stay (HOSPITAL_BASED_OUTPATIENT_CLINIC_OR_DEPARTMENT_OTHER)
Admission: EM | Admit: 2022-06-24 | Discharge: 2022-06-28 | DRG: 378 | Disposition: A | Discharge status: Home Health | Payer: Medicare Other | Attending: Family Medicine | Admitting: Family Medicine

## 2022-06-24 ENCOUNTER — Emergency Department (HOSPITAL_BASED_OUTPATIENT_CLINIC_OR_DEPARTMENT_OTHER): Payer: Medicare Other

## 2022-06-24 ENCOUNTER — Other Ambulatory Visit: Payer: Self-pay

## 2022-06-24 DIAGNOSIS — Z832 Family history of diseases of the blood and blood-forming organs and certain disorders involving the immune mechanism: Secondary | ICD-10-CM | POA: Diagnosis not present

## 2022-06-24 DIAGNOSIS — F1190 Opioid use, unspecified, uncomplicated: Secondary | ICD-10-CM | POA: Diagnosis not present

## 2022-06-24 DIAGNOSIS — K5520 Angiodysplasia of colon without hemorrhage: Secondary | ICD-10-CM

## 2022-06-24 DIAGNOSIS — D649 Anemia, unspecified: Secondary | ICD-10-CM

## 2022-06-24 DIAGNOSIS — Z803 Family history of malignant neoplasm of breast: Secondary | ICD-10-CM | POA: Diagnosis not present

## 2022-06-24 DIAGNOSIS — I480 Paroxysmal atrial fibrillation: Secondary | ICD-10-CM | POA: Diagnosis not present

## 2022-06-24 DIAGNOSIS — R918 Other nonspecific abnormal finding of lung field: Secondary | ICD-10-CM | POA: Diagnosis not present

## 2022-06-24 DIAGNOSIS — I129 Hypertensive chronic kidney disease with stage 1 through stage 4 chronic kidney disease, or unspecified chronic kidney disease: Secondary | ICD-10-CM | POA: Diagnosis not present

## 2022-06-24 DIAGNOSIS — Z823 Family history of stroke: Secondary | ICD-10-CM | POA: Diagnosis not present

## 2022-06-24 DIAGNOSIS — J439 Emphysema, unspecified: Secondary | ICD-10-CM | POA: Diagnosis present

## 2022-06-24 DIAGNOSIS — N179 Acute kidney failure, unspecified: Secondary | ICD-10-CM | POA: Diagnosis not present

## 2022-06-24 DIAGNOSIS — Z8701 Personal history of pneumonia (recurrent): Secondary | ICD-10-CM

## 2022-06-24 DIAGNOSIS — Z87891 Personal history of nicotine dependence: Secondary | ICD-10-CM

## 2022-06-24 DIAGNOSIS — D6832 Hemorrhagic disorder due to extrinsic circulating anticoagulants: Secondary | ICD-10-CM | POA: Diagnosis present

## 2022-06-24 DIAGNOSIS — T45515A Adverse effect of anticoagulants, initial encounter: Secondary | ICD-10-CM | POA: Diagnosis not present

## 2022-06-24 DIAGNOSIS — I10 Essential (primary) hypertension: Secondary | ICD-10-CM | POA: Diagnosis present

## 2022-06-24 DIAGNOSIS — J441 Chronic obstructive pulmonary disease with (acute) exacerbation: Secondary | ICD-10-CM | POA: Diagnosis present

## 2022-06-24 DIAGNOSIS — Z86718 Personal history of other venous thrombosis and embolism: Secondary | ICD-10-CM | POA: Diagnosis not present

## 2022-06-24 DIAGNOSIS — I4891 Unspecified atrial fibrillation: Secondary | ICD-10-CM | POA: Diagnosis not present

## 2022-06-24 DIAGNOSIS — J449 Chronic obstructive pulmonary disease, unspecified: Secondary | ICD-10-CM | POA: Diagnosis present

## 2022-06-24 DIAGNOSIS — D62 Acute posthemorrhagic anemia: Secondary | ICD-10-CM | POA: Diagnosis not present

## 2022-06-24 DIAGNOSIS — G63 Polyneuropathy in diseases classified elsewhere: Secondary | ICD-10-CM | POA: Diagnosis present

## 2022-06-24 DIAGNOSIS — K922 Gastrointestinal hemorrhage, unspecified: Secondary | ICD-10-CM

## 2022-06-24 DIAGNOSIS — K31811 Angiodysplasia of stomach and duodenum with bleeding: Secondary | ICD-10-CM | POA: Diagnosis not present

## 2022-06-24 DIAGNOSIS — G8929 Other chronic pain: Secondary | ICD-10-CM | POA: Diagnosis present

## 2022-06-24 DIAGNOSIS — I7123 Aneurysm of the descending thoracic aorta, without rupture: Secondary | ICD-10-CM | POA: Diagnosis present

## 2022-06-24 DIAGNOSIS — Z8249 Family history of ischemic heart disease and other diseases of the circulatory system: Secondary | ICD-10-CM | POA: Diagnosis not present

## 2022-06-24 DIAGNOSIS — Z79899 Other long term (current) drug therapy: Secondary | ICD-10-CM

## 2022-06-24 DIAGNOSIS — Z8719 Personal history of other diseases of the digestive system: Secondary | ICD-10-CM

## 2022-06-24 DIAGNOSIS — I82409 Acute embolism and thrombosis of unspecified deep veins of unspecified lower extremity: Secondary | ICD-10-CM | POA: Diagnosis not present

## 2022-06-24 DIAGNOSIS — R911 Solitary pulmonary nodule: Secondary | ICD-10-CM | POA: Diagnosis not present

## 2022-06-24 DIAGNOSIS — Z7901 Long term (current) use of anticoagulants: Secondary | ICD-10-CM

## 2022-06-24 DIAGNOSIS — N1831 Chronic kidney disease, stage 3a: Secondary | ICD-10-CM | POA: Insufficient documentation

## 2022-06-24 DIAGNOSIS — N1832 Chronic kidney disease, stage 3b: Secondary | ICD-10-CM | POA: Insufficient documentation

## 2022-06-24 DIAGNOSIS — K6381 Dieulafoy lesion of intestine: Secondary | ICD-10-CM

## 2022-06-24 DIAGNOSIS — Z20822 Contact with and (suspected) exposure to covid-19: Secondary | ICD-10-CM | POA: Diagnosis not present

## 2022-06-24 DIAGNOSIS — N183 Chronic kidney disease, stage 3 unspecified: Secondary | ICD-10-CM | POA: Diagnosis present

## 2022-06-24 DIAGNOSIS — Z66 Do not resuscitate: Secondary | ICD-10-CM | POA: Diagnosis not present

## 2022-06-24 DIAGNOSIS — I82401 Acute embolism and thrombosis of unspecified deep veins of right lower extremity: Secondary | ICD-10-CM | POA: Diagnosis present

## 2022-06-24 DIAGNOSIS — N319 Neuromuscular dysfunction of bladder, unspecified: Secondary | ICD-10-CM | POA: Diagnosis present

## 2022-06-24 DIAGNOSIS — K31819 Angiodysplasia of stomach and duodenum without bleeding: Secondary | ICD-10-CM | POA: Diagnosis not present

## 2022-06-24 DIAGNOSIS — Z833 Family history of diabetes mellitus: Secondary | ICD-10-CM

## 2022-06-24 DIAGNOSIS — I739 Peripheral vascular disease, unspecified: Secondary | ICD-10-CM | POA: Diagnosis present

## 2022-06-24 HISTORY — DX: Paroxysmal atrial fibrillation: I48.0

## 2022-06-24 LAB — CBC
HCT: 28.4 % — ABNORMAL LOW (ref 36.0–46.0)
Hemoglobin: 9.1 g/dL — ABNORMAL LOW (ref 12.0–15.0)
MCH: 28.6 pg (ref 26.0–34.0)
MCHC: 32 g/dL (ref 30.0–36.0)
MCV: 89.3 fL (ref 80.0–100.0)
Platelets: 439 10*3/uL — ABNORMAL HIGH (ref 150–400)
RBC: 3.18 MIL/uL — ABNORMAL LOW (ref 3.87–5.11)
RDW: 13.4 % (ref 11.5–15.5)
WBC: 7.4 10*3/uL (ref 4.0–10.5)
nRBC: 0 % (ref 0.0–0.2)

## 2022-06-24 LAB — BASIC METABOLIC PANEL
Anion gap: 9 (ref 5–15)
BUN: 30 mg/dL — ABNORMAL HIGH (ref 8–23)
CO2: 27 mmol/L (ref 22–32)
Calcium: 9.5 mg/dL (ref 8.9–10.3)
Chloride: 97 mmol/L — ABNORMAL LOW (ref 98–111)
Creatinine, Ser: 0.96 mg/dL (ref 0.44–1.00)
GFR, Estimated: 57 mL/min — ABNORMAL LOW (ref >60)
Glucose, Bld: 125 mg/dL — ABNORMAL HIGH (ref 70–99)
Potassium: 4.1 mmol/L (ref 3.5–5.1)
Sodium: 133 mmol/L — ABNORMAL LOW (ref 135–145)

## 2022-06-24 LAB — CBG MONITORING, ED: Glucose-Capillary: 131 mg/dL — ABNORMAL HIGH (ref 70–99)

## 2022-06-24 LAB — TROPONIN I (HIGH SENSITIVITY)
Troponin I (High Sensitivity): 14 ng/L (ref <18)
Troponin I (High Sensitivity): 16 ng/L (ref <18)

## 2022-06-24 LAB — OCCULT BLOOD X 1 CARD TO LAB, STOOL: Fecal Occult Bld: POSITIVE — AB

## 2022-06-24 LAB — RESP PANEL BY RT-PCR (FLU A&B, COVID) ARPGX2
Influenza A by PCR: NEGATIVE
Influenza B by PCR: NEGATIVE
SARS Coronavirus 2 by RT PCR: NEGATIVE

## 2022-06-24 MED ORDER — IOHEXOL 350 MG/ML SOLN
100.0000 mL | Freq: Once | INTRAVENOUS | Status: AC | PRN
  Administered 2022-06-24: 100 mL via INTRAVENOUS

## 2022-06-24 MED ORDER — PANTOPRAZOLE SODIUM 40 MG IV SOLR
40.0000 mg | Freq: Once | INTRAVENOUS | Status: AC
  Administered 2022-06-24: 40 mg via INTRAVENOUS
  Filled 2022-06-24: qty 10

## 2022-06-24 NOTE — ED Provider Notes (Signed)
[facility] EMERGENCY DEPT Provider Note   CSN: [id number] Arrival date & time: 06/24/22  1732     History  Chief Complaint  Patient presents with   Fatigue    Nicole Perez is a 88 y.o. female.  HPI Patient presents with URI symptoms weakness and fatigue.  Has had for the last few weeks.  Found to have DVT in the right lower leg.  Started on anticoagulation.  Also recently started on Augmentin for COPD exacerbation and prednisone.  Just feels more fatigued.  Decreased appetite. Appears to be in atrial fibrillation here.  With a mildly rapid rate.  Questionable A-fib diagnosed in the past.  Had not been on anticoagulation until recent DVT.  Appears to be bradycardia that was junctional without a clear A-fib diagnosis from what I can see in the notes.    Home Medications Prior to Admission medications   Medication Sig Start Date End Date Taking? Authorizing Provider  amoxicillin-clavulanate (AUGMENTIN) 875-125 MG tablet Take 1 tablet by mouth 2 (two) times daily for 5 days. 06/21/22 06/26/22  McDiarmid, Blane Ohara, MD  apixaban (ELIQUIS) 5 MG TABS tablet Take 1 tablet (5 mg total) by mouth 2 (two) times daily. Take 2 tablets ('10mg'$ ) twice daily for 7 days, then 1 tablet ('5mg'$ ) twice daily 05/31/22 07/30/22  McDiarmid, Blane Ohara, MD  bisacodyl 5 MG EC tablet Take 2 tablets (10 mg total) by mouth daily. 06/22/21   McDiarmid, Blane Ohara, MD  docusate sodium (COLACE) 100 MG capsule Take 1 capsule (100 mg total) by mouth 2 (two) times daily. 03/08/22   McDiarmid, Blane Ohara, MD  gabapentin (NEURONTIN) 300 MG capsule Take 1 capsule (300 mg total) by mouth 3 (three) times daily. 03/29/22   McDiarmid, Blane Ohara, MD  hydrochlorothiazide (HYDRODIURIL) 25 MG tablet TAKE 1 TABLET(25 MG) BY MOUTH DAILY 06/14/22   Ganta, Anupa, DO  HYDROcodone-acetaminophen (NORCO) 10-325 MG tablet Take 1 tablet by mouth every 6 (six) hours as needed for moderate pain. 06/07/22 07/07/22  McDiarmid, Blane Ohara, MD  losartan (COZAAR) 25 MG  tablet TAKE 2 TABLETS(50 MG) BY MOUTH AT BEDTIME 06/18/22   McDiarmid, Blane Ohara, MD  predniSONE (DELTASONE) 20 MG tablet Take 2 tablets (40 mg total) by mouth daily for 5 days. Take for 7 days 06/21/22 06/26/22  McDiarmid, Blane Ohara, MD      Allergies    Ciprofloxacin, Nitrofurantoin, and Keflex [cephalexin]    Review of Systems   Review of Systems  Physical Exam Updated Vital Signs BP 126/73   Pulse 84   Temp 97.9 F (36.6 C) (Oral)   Resp (!) 22   LMP 12/17/1962   SpO2 93%  Physical Exam Vitals and nursing note reviewed.  HENT:     Head: Normocephalic.  Cardiovascular:     Rate and Rhythm: Tachycardia present. Rhythm irregular.  Pulmonary:     Breath sounds: No wheezing or rhonchi.  Abdominal:     Tenderness: There is no abdominal tenderness.  Musculoskeletal:     Comments: Mild tenderness of right calf.  Skin:    General: Skin is warm.     Capillary Refill: Capillary refill takes less than 2 seconds.  Neurological:     Mental Status: She is alert. Mental status is at baseline.     ED Results / Procedures / Treatments   Labs (all labs ordered are listed, but only abnormal results are displayed) Labs Reviewed  BASIC METABOLIC PANEL - Abnormal; Notable for the following components:  Result Value   Sodium 133 (*)    Chloride 97 (*)    Glucose, Bld 125 (*)    BUN 30 (*)    GFR, Estimated 57 (*)    All other components within normal limits  CBC - Abnormal; Notable for the following components:   RBC 3.18 (*)    Hemoglobin 9.1 (*)    HCT 28.4 (*)    Platelets 439 (*)    All other components within normal limits  CBG MONITORING, ED - Abnormal; Notable for the following components:   Glucose-Capillary 131 (*)    All other components within normal limits  RESP PANEL BY RT-PCR (FLU A&B, COVID) ARPGX2  URINALYSIS, ROUTINE W REFLEX MICROSCOPIC  TROPONIN I (HIGH SENSITIVITY)    EKG EKG Interpretation  Date/Time:  Sunday June 24 2022 18:24:11 EDT Ventricular Rate:   117 PR Interval:    QRS Duration: 66 QT Interval:  288 QTC Calculation: 401 R Axis:   23 Text Interpretation: Atrial fibrillation with rapid ventricular response Abnormal ECG When compared with ECG of 01-May-2017 10:31, Atrial fibrillation has replaced Junctional rhythm Vent. rate has increased BY  69 BPM Nonspecific T wave abnormality now evident in Anterior leads Confirmed by Davonna Belling [phone]) on 06/24/2022 6:48:34 PM  Radiology No results found.  Procedures Procedures    Medications Ordered in ED Medications  iohexol (OMNIPAQUE) 350 MG/ML injection 100 mL (has no administration in time range)    ED Course/ Medical Decision Making/ A&P                           Medical Decision Making Amount and/or Complexity of Data Reviewed Labs: ordered. Radiology: ordered.  Risk Prescription drug management.   Patient with fatigue shortness of breath.  Recently diagnosed with DVT and also treated for exacerbation of COPD with steroids and antibiotics.  Continued shortness of breath.  Found to be in new A-fib with mild RVR.  CHA2DS2-VASc of at least 5.  Already on anticoagulation.  Hemoglobin is decreased to 9.1 and appears baseline is higher than this.  Will check Hemoccult.  Will get CT angiography to evaluate for pulmonary embolism with worsening shortness of breath and recent DVT  CT angiography does not show pulmonary embolism.  Does have some nodules could be infectious versus less likely malignancy.  Will need follow-up.  However hemoglobin his decreased.  With A-fib with some mild RVR on anticoagulation I feel he should benefit from mission the hospital for maximizing of her heart rate control and following the hemoglobin.  Will discuss with hospitalist. Rectal exam showed brown stool and Hemoccult pending  Hemoccult was positive.  Will give some IV Protonix.  Reportedly has had some lower GI bleeding in the past.        Final Clinical Impression(s) / ED  Diagnoses Final diagnoses:  None    Rx / DC Orders ED Discharge Orders     None         Davonna Belling, MD 06/24/22 2246

## 2022-06-24 NOTE — ED Notes (Signed)
Patient transported to CT via stretcher (late entry)  

## 2022-06-24 NOTE — Plan of Care (Signed)
[facility] will assume care on arrival to accepting facility. Until arrival, care as per EDP. However, [facility] available 24/7 for questions and assistance.  Nursing staff, please page [facility] Admits and Consults ([phone]) as soon as the patient arrives to the hospital.   

## 2022-06-24 NOTE — ED Triage Notes (Signed)
Patient here POV from Home.  Endorses having URI Symptoms for a few weeks but more recently the Patient has been Fatigued and has had Decreased PO Intake.  No Known Fevers. No N/V/D. No Pain.   Recently placed on Anticoagulants (Eliquis) for DVT in RLE.   NAD Noted during Triage. A&Ox4. GCS 15. BIB Wheelchair.

## 2022-06-25 ENCOUNTER — Ambulatory Visit: Payer: Medicare Other | Admitting: Family Medicine

## 2022-06-25 ENCOUNTER — Encounter (HOSPITAL_COMMUNITY): Payer: Self-pay | Admitting: Family Medicine

## 2022-06-25 ENCOUNTER — Telehealth: Payer: Self-pay | Admitting: Family Medicine

## 2022-06-25 DIAGNOSIS — I480 Paroxysmal atrial fibrillation: Secondary | ICD-10-CM | POA: Diagnosis not present

## 2022-06-25 DIAGNOSIS — Z20822 Contact with and (suspected) exposure to covid-19: Secondary | ICD-10-CM | POA: Diagnosis not present

## 2022-06-25 DIAGNOSIS — J439 Emphysema, unspecified: Secondary | ICD-10-CM | POA: Diagnosis not present

## 2022-06-25 DIAGNOSIS — Z803 Family history of malignant neoplasm of breast: Secondary | ICD-10-CM | POA: Diagnosis not present

## 2022-06-25 DIAGNOSIS — Z66 Do not resuscitate: Secondary | ICD-10-CM | POA: Diagnosis not present

## 2022-06-25 DIAGNOSIS — Z8719 Personal history of other diseases of the digestive system: Secondary | ICD-10-CM | POA: Diagnosis not present

## 2022-06-25 DIAGNOSIS — R918 Other nonspecific abnormal finding of lung field: Secondary | ICD-10-CM

## 2022-06-25 DIAGNOSIS — N179 Acute kidney failure, unspecified: Secondary | ICD-10-CM | POA: Diagnosis not present

## 2022-06-25 DIAGNOSIS — D649 Anemia, unspecified: Secondary | ICD-10-CM | POA: Diagnosis present

## 2022-06-25 DIAGNOSIS — T45515A Adverse effect of anticoagulants, initial encounter: Secondary | ICD-10-CM | POA: Diagnosis not present

## 2022-06-25 DIAGNOSIS — D6832 Hemorrhagic disorder due to extrinsic circulating anticoagulants: Secondary | ICD-10-CM | POA: Diagnosis not present

## 2022-06-25 DIAGNOSIS — Z79899 Other long term (current) drug therapy: Secondary | ICD-10-CM | POA: Diagnosis not present

## 2022-06-25 DIAGNOSIS — I4891 Unspecified atrial fibrillation: Secondary | ICD-10-CM | POA: Diagnosis not present

## 2022-06-25 DIAGNOSIS — K31811 Angiodysplasia of stomach and duodenum with bleeding: Secondary | ICD-10-CM | POA: Diagnosis not present

## 2022-06-25 DIAGNOSIS — I129 Hypertensive chronic kidney disease with stage 1 through stage 4 chronic kidney disease, or unspecified chronic kidney disease: Secondary | ICD-10-CM | POA: Diagnosis not present

## 2022-06-25 DIAGNOSIS — D62 Acute posthemorrhagic anemia: Secondary | ICD-10-CM | POA: Diagnosis not present

## 2022-06-25 DIAGNOSIS — I7123 Aneurysm of the descending thoracic aorta, without rupture: Secondary | ICD-10-CM | POA: Diagnosis not present

## 2022-06-25 DIAGNOSIS — Z832 Family history of diseases of the blood and blood-forming organs and certain disorders involving the immune mechanism: Secondary | ICD-10-CM | POA: Diagnosis not present

## 2022-06-25 DIAGNOSIS — Z823 Family history of stroke: Secondary | ICD-10-CM | POA: Diagnosis not present

## 2022-06-25 DIAGNOSIS — G63 Polyneuropathy in diseases classified elsewhere: Secondary | ICD-10-CM | POA: Diagnosis not present

## 2022-06-25 DIAGNOSIS — Z8701 Personal history of pneumonia (recurrent): Secondary | ICD-10-CM | POA: Diagnosis not present

## 2022-06-25 DIAGNOSIS — N183 Chronic kidney disease, stage 3 unspecified: Secondary | ICD-10-CM | POA: Diagnosis not present

## 2022-06-25 DIAGNOSIS — N319 Neuromuscular dysfunction of bladder, unspecified: Secondary | ICD-10-CM | POA: Diagnosis not present

## 2022-06-25 DIAGNOSIS — Z8249 Family history of ischemic heart disease and other diseases of the circulatory system: Secondary | ICD-10-CM | POA: Diagnosis not present

## 2022-06-25 DIAGNOSIS — Z86718 Personal history of other venous thrombosis and embolism: Secondary | ICD-10-CM | POA: Diagnosis not present

## 2022-06-25 DIAGNOSIS — Z7901 Long term (current) use of anticoagulants: Secondary | ICD-10-CM | POA: Diagnosis not present

## 2022-06-25 DIAGNOSIS — Z87891 Personal history of nicotine dependence: Secondary | ICD-10-CM | POA: Diagnosis not present

## 2022-06-25 HISTORY — DX: Other nonspecific abnormal finding of lung field: R91.8

## 2022-06-25 LAB — CBC
HCT: 26.4 % — ABNORMAL LOW (ref 36.0–46.0)
Hemoglobin: 8.5 g/dL — ABNORMAL LOW (ref 12.0–15.0)
MCH: 28.5 pg (ref 26.0–34.0)
MCHC: 32.2 g/dL (ref 30.0–36.0)
MCV: 88.6 fL (ref 80.0–100.0)
Platelets: 445 10*3/uL — ABNORMAL HIGH (ref 150–400)
RBC: 2.98 MIL/uL — ABNORMAL LOW (ref 3.87–5.11)
RDW: 13.2 % (ref 11.5–15.5)
WBC: 5.6 10*3/uL (ref 4.0–10.5)
nRBC: 0 % (ref 0.0–0.2)

## 2022-06-25 LAB — URINALYSIS, ROUTINE W REFLEX MICROSCOPIC
Bilirubin Urine: NEGATIVE
Glucose, UA: NEGATIVE mg/dL
Hgb urine dipstick: NEGATIVE
Ketones, ur: NEGATIVE mg/dL
Leukocytes,Ua: NEGATIVE
Nitrite: NEGATIVE
Protein, ur: NEGATIVE mg/dL
Specific Gravity, Urine: 1.019 (ref 1.005–1.030)
pH: 6.5 (ref 5.0–8.0)

## 2022-06-25 LAB — MRSA NEXT GEN BY PCR, NASAL: MRSA by PCR Next Gen: NOT DETECTED

## 2022-06-25 MED ORDER — GABAPENTIN 300 MG PO CAPS
300.0000 mg | ORAL_CAPSULE | Freq: Every day | ORAL | Status: DC | PRN
  Administered 2022-06-26 – 2022-06-28 (×2): 300 mg via ORAL
  Filled 2022-06-25 (×2): qty 1

## 2022-06-25 MED ORDER — METOPROLOL TARTRATE 12.5 MG HALF TABLET
12.5000 mg | ORAL_TABLET | Freq: Two times a day (BID) | ORAL | Status: DC
  Administered 2022-06-25 – 2022-06-28 (×5): 12.5 mg via ORAL
  Filled 2022-06-25 (×4): qty 1

## 2022-06-25 MED ORDER — PANTOPRAZOLE SODIUM 40 MG PO TBEC
40.0000 mg | DELAYED_RELEASE_TABLET | Freq: Every day | ORAL | Status: DC
  Administered 2022-06-26 – 2022-06-28 (×3): 40 mg via ORAL
  Filled 2022-06-25 (×3): qty 1

## 2022-06-25 MED ORDER — HYDROCODONE-ACETAMINOPHEN 10-325 MG PO TABS
1.0000 | ORAL_TABLET | Freq: Four times a day (QID) | ORAL | Status: DC | PRN
  Administered 2022-06-25 – 2022-06-26 (×2): 1 via ORAL
  Filled 2022-06-25 (×2): qty 1

## 2022-06-25 MED ORDER — SODIUM CHLORIDE 0.9% FLUSH
3.0000 mL | Freq: Two times a day (BID) | INTRAVENOUS | Status: DC
  Administered 2022-06-25 – 2022-06-28 (×5): 3 mL via INTRAVENOUS

## 2022-06-25 MED ORDER — PREDNISONE 20 MG PO TABS
40.0000 mg | ORAL_TABLET | Freq: Every day | ORAL | Status: AC
Start: 2022-06-26 — End: 2022-06-26
  Administered 2022-06-26: 40 mg via ORAL
  Filled 2022-06-25: qty 2

## 2022-06-25 MED ORDER — ACETAMINOPHEN 500 MG PO TABS
1000.0000 mg | ORAL_TABLET | Freq: Four times a day (QID) | ORAL | Status: DC | PRN
  Administered 2022-06-25 (×2): 1000 mg via ORAL
  Filled 2022-06-25 (×2): qty 2

## 2022-06-25 MED ORDER — SODIUM CHLORIDE 0.9% FLUSH
3.0000 mL | INTRAVENOUS | Status: DC | PRN
Start: 2022-06-25 — End: 2022-06-28

## 2022-06-25 MED ORDER — AMOXICILLIN-POT CLAVULANATE 875-125 MG PO TABS
1.0000 | ORAL_TABLET | Freq: Two times a day (BID) | ORAL | Status: AC
  Administered 2022-06-25 – 2022-06-26 (×2): 1 via ORAL
  Filled 2022-06-25 (×2): qty 1

## 2022-06-25 MED ORDER — DARIFENACIN HYDROBROMIDE ER 7.5 MG PO TB24
7.5000 mg | ORAL_TABLET | Freq: Every day | ORAL | Status: DC
  Administered 2022-06-25 – 2022-06-28 (×3): 7.5 mg via ORAL
  Filled 2022-06-25 (×4): qty 1

## 2022-06-25 MED ORDER — SODIUM CHLORIDE 0.9 % IV SOLN
250.0000 mL | INTRAVENOUS | Status: DC | PRN
Start: 2022-06-25 — End: 2022-06-28

## 2022-06-25 MED ORDER — GABAPENTIN 300 MG PO CAPS
300.0000 mg | ORAL_CAPSULE | Freq: Three times a day (TID) | ORAL | Status: AC
  Administered 2022-06-25: 300 mg via ORAL
  Filled 2022-06-25: qty 1

## 2022-06-25 MED ORDER — METOPROLOL TARTRATE 12.5 MG HALF TABLET
12.5000 mg | ORAL_TABLET | Freq: Two times a day (BID) | ORAL | Status: DC
  Filled 2022-06-25: qty 1

## 2022-06-25 NOTE — Plan of Care (Signed)
I spoke with Dr. Alvino Chapel about this patient and accepted her for admission at [facility].  Bed request was placed around midnight.  At that time it was noted that the patient was with family medicine teaching service and I had discussed this with Dr. Alvino Chapel who informed me that family medicine teaching service does not accept patients from [facility] and that the patient would have to be admitted by hospitalist service.  Signout per ED physician: 88 year old female with multiple medical comorbidities including CKD, hypertension, hyperlipidemia, history of GI bleed presenting to the ED with complaints of URI symptoms and fatigue.  She was recently started on Eliquis for DVT and also treated for COPD exacerbation with Augmentin and prednisone.  In the ED, found to be in A-fib with mild RVR.  Blood pressure stable.  Family reported patient having history of A-fib but it is not documented in the chart.  CT angiogram negative for PE but does show pulmonary nodules.  Also her hemoglobin is 9.1 which is below her baseline and FOBT positive with brown stool.  She was given Protonix.  No rate control agent given.   Patient is still at [facility] ED waiting for a bed at [facility].  I have spoken to family medicine teaching service night resident Dr. Precious Haws who discussed this with her attending Dr. Erin Hearing and I was informed that their service accepts their clinic patients from outside [facility] facilities such as [facility] and [facility] ED as long as the patient is coming to [facility] teaching service will be admitting the patient upon arrival to [facility]

## 2022-06-25 NOTE — H&P (View-Only) (Signed)
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 Transfusions: See HPI. Neuro:  No headaches, no peripheral tingling or numbness.  No dizziness, no syncope. MS: Complains of leg pain. Derm:  No itching, no rash or sores.  Endocrine:  No sweats or chills.  No polyuria or dysuria Immunization:   Travel: Queried.   PHYSICAL EXAM: Vital signs in last 24 hours: Vitals:   06/25/22 1500 06/25/22 1608  BP: 127/77 123/82  Pulse:  (!) 103  Resp: 19 15  Temp:  98.3 F (36.8 C)  SpO2:     Wt Readings from Last 3 Encounters:  06/21/22 65.3 kg  06/14/22 65.3 kg  04/11/22 68.9 kg    General: Patient looks aged, frail.  She is hard of  hearing. Head: No facial asymmetry or swelling.  No signs of head trauma. Eyes: Conjunctiva pink. Ears: Hard of hearing but able to understand if you speak loud, slowly, clearly. Nose: No congestion or discharge. Mouth: Tongue is midline.  Mucosa is pink, moist, clear. Neck: JVD, no masses, no thyromegaly. Lungs: Active cough with clear sputum.  Diminished breath sounds Heart: RRR.  No MRG.  S1, S2 present Abdomen: Soft.  Not tender or distended.  No HSM, masses, bruits, hernias.   Rectal: Deferred. Musc/Skeltl: No joint redness, swelling or gross deformity. Extremities: No CCE Neurologic: Hard of hearing.  Appropriate.  Not confused.  Moves all 4 limbs.  Weakness on right lower extremity. Skin: No rash, no sores, no significant bruising.  No telangiectasia. Nodes: No cervical adenopathy Psych: Calm, cooperative.  Intake/Output from previous day: No intake/output data recorded. Intake/Output this shift: No intake/output data recorded.  LAB RESULTS: Recent Labs    06/24/22 1820 06/25/22 0958  WBC 7.4 5.6  HGB 9.1* 8.5*  HCT 28.4* 26.4*  PLT 439* 445*   BMET Lab Results  Component Value Date   NA 133 (L) 06/24/2022   NA 141 06/21/2022   NA 140 05/08/2022   K 4.1 06/24/2022   K 3.9 06/21/2022   K 3.1 (L) 05/08/2022   CL 97 (L) 06/24/2022   CL 100 06/21/2022   CL 105 05/08/2022   CO2 27 06/24/2022   CO2 23 06/21/2022   CO2 27 05/08/2022   GLUCOSE 125 (H) 06/24/2022   GLUCOSE 115 (H) 06/21/2022   GLUCOSE 104 (H) 05/08/2022   BUN 30 (H) 06/24/2022   BUN 39 (H) 06/21/2022   BUN 33 (H) 05/08/2022   CREATININE 0.96 06/24/2022   CREATININE 1.43 (H) 06/21/2022   CREATININE 1.12 (H) 05/08/2022   CALCIUM 9.5 06/24/2022   CALCIUM 9.0 06/21/2022   CALCIUM 9.6 05/08/2022   LFT No results for input(s): "PROT", "ALBUMIN", "AST", "ALT", "ALKPHOS", "BILITOT", "BILIDIR", "IBILI" in the last 72 hours. PT/INR Lab Results  Component Value Date   INR 1.16 05/01/2016   INR  1.14 04/30/2016   INR 1.10 04/25/2016  Hepatitis Panel No results for input(s): "HEPBSAG", "HCVAB", "HEPAIGM", "HEPBIGM" in the last 72 hours. C-Diff No components found for: "CDIFF" Lipase  No results found for: "LIPASE"  Drugs of Abuse  No results found for: "LABOPIA", "COCAINSCRNUR", "LABBENZ", "AMPHETMU", "THCU", "LABBARB"   RADIOLOGY STUDIES: CT Angio Chest PE W and/or Wo Contrast  Result Date: 06/24/2022 CLINICAL DATA:  Pulmonary embolism suspected, high probability. Fatigue and decreased p.o. intake. History of blood clots. EXAM: CT ANGIOGRAPHY CHEST WITH CONTRAST TECHNIQUE: Multidetector CT imaging of the chest was performed using the standard protocol during bolus administration of intravenous contrast. Multiplanar CT image reconstructions and MIPs were obtained to evaluate the vascular anatomy. RADIATION DOSE REDUCTION: This exam was performed according to the departmental dose-optimization program which includes automated exposure control, adjustment of the mA and/or kV according to patient size and/or use of iterative reconstruction technique. CONTRAST:  179m OMNIPAQUE IOHEXOL 350 MG/ML SOLN COMPARISON:  06/06/2020. FINDINGS: Cardiovascular: The heart is normal in size and there is no pericardial effusion. Multi-vessel coronary artery calcifications are noted. There is atherosclerotic calcification of the aorta with aneurysmal dilatation of the proximal descending aorta measuring 4.1 x 4.3 cm. The distal descending aorta measures 2.9 x 3.3 cm. The pulmonary trunk is normal in caliber. No large central pulmonary embolus is identified. Evaluation of the segmental and subsegmental pulmonary arteries is limited due to mixing artifact. Mediastinum/Nodes: Enlarged lymph nodes are present in the mediastinum measuring up to 1 cm in short axis diameter. A prominent lymph node is noted at the right hilum measuring 1.3 cm. No left hilar or axillary lymphadenopathy by size criteria. The thyroid  gland, trachea, and esophagus are within normal limits. Lungs/Pleura: Emphysematous changes are present in the lungs. Scattered pulmonary nodules are present bilaterally. The largest nodule on the right is in the posterior segment of the right upper lobe measuring 1 cm and is ill-defined, axial image 27. The largest nodule in the left measures 1.3 cm and is ill-defined, axial image 31. No consolidation, effusion, or pneumothorax. Upper Abdomen: No acute abnormality. Musculoskeletal: Degenerative changes are present in the thoracic spine. No acute osseous abnormality. Review of the MIP images confirms the above findings. IMPRESSION: 1. No large central pulmonary embolus. There is limited evaluation of the distal pulmonary arteries due to mixing artifact. 2. Scattered pulmonary nodules bilaterally. While findings may be infectious or inflammatory, the possibility of underlying neoplasm can not be excluded. Consider one of the following in 3 months for both low-risk and high-risk individuals: (a) repeat chest CT, (b) follow-up PET-CT, or (c) tissue sampling. This recommendation follows the consensus statement: Guidelines for Management of Incidental Pulmonary Nodules Detected on CT Images: From the Fleischner Society 2017; Radiology 2017; 284:228-243. 3. Aortic atherosclerosis with aneurysmal dilatation of the descending thoracic aorta measuring 4.3 cm. Recommend semi-annual imaging followup by CTA or MRA and referral to cardiothoracic surgery if not already obtained. This recommendation follows 2010 ACCF/AHA/AATS/ACR/ASA/SCA/SCAI/SIR/STS/SVM Guidelines for the Diagnosis and Management of Patients With Thoracic Aortic Disease. Circulation. 2010; 121:: H038-U82 Aortic aneurysm NOS (ICD10-I71.9) 4. Emphysema. 5. Coronary artery calcifications. Electronically Signed   By: LBrett FairyM.D.   On: 06/24/2022 21:50    \ IMPRESSION:     FOBT + anemia.  Suspect recurrent bleeding from AVMs.  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  and ultimately underwent embolization of pancreaticoduodenal branch artery at and spiral enteroscopy with clipping of jejunal lesion.  Pt not on PPI etc.    Eliquis  for DVT, started 2 m ago.  Last dose was 7/9.  No PE on yesterday's CT scan.  Acute respiratory illness.  MD suspected COPD flare and started Augmentin last week.  Also prescribed prednisone but pt did not take it because she does not like the way it makes her feel.  CT chest negative for PE.  Confirms scattered pulmonary nodules, mediastinal lymphadenopathy, emphysema.    Adenomatous and HP colon polyps.  Last colonoscopy 03/2015 w SSA and TA polyps.    PLAN:     EGD versus SBE when patient's respiratory status improves.  Liberalize diet to soft type.  Protonix 40 mg p.o. daily though this is not likely to affect bleeding from suspected AVMs it is judicious.  CBC in AM.   Azucena Freed  06/25/2022, 4:50 PM Phone [phone]

## 2022-06-25 NOTE — Assessment & Plan Note (Addendum)
Diagnosed in ED 05/08/2022, started on full dose Eliquis.  Followed by vascular, Dr. Randie Heinz. LE ultrasound negative for DVT yesterday (7/12) - Hold anticoag, use SCDs for prophylaxis

## 2022-06-25 NOTE — Assessment & Plan Note (Signed)
Stable. Self-catheterizes. - Continue in/out cath - Continue use of Depends

## 2022-06-25 NOTE — Assessment & Plan Note (Signed)
On Norco 10-'325mg'$  q6h PRN for pain. - Continue home meds as needed for chronic pain

## 2022-06-25 NOTE — Hospital Course (Signed)
    Items for follow-up: Repeat chest CT in 3 months to monitor pulmonary nodules Semi-annual CTA or MRA follow-up and referral to cardiothoracic surgery for aortic atherosclerosis with aneurysmal dilatation of descending thoracic aorta

## 2022-06-25 NOTE — Assessment & Plan Note (Signed)
On Gabapentin '300mg'$  TID. - Continue home med

## 2022-06-25 NOTE — H&P (Cosign Needed Addendum)
Hospital Admission History and Physical Service Pager: [phone]  Patient name: Nicole Perez Medical record number: [id number] Date of Birth: Feb 13, 1933 Age: 88 y.o. Gender: female  Primary Care Provider: McDiarmid, Blane Ohara, MD Consultants: GI Code Status: Partial code - intubation only, no cardiac resuscitation  Preferred Emergency Contact: [name] (primary, daughter): [phone] Kym Groom (primary, daughter): [phone]  Chief Complaint: Fatigue, shortness of breath   Assessment and Plan: Nicole Perez is a 88 y.o. female presenting with fatigue and shortness of breath, was found to be anemic and in A-fib with RVR (HR low 100s).  She has been admitted for 3 problems: Symptomatic anemia, A-fib with RVR, and shortness of breath with sputum production.  Symptomatic anemia most concerning for GI bleed given pertinent history and lack of other bleeding nidus.  Shortness of breath differential includes COPD exacerbation, viral URI, and PE.  Lingering COPD exacerbation ` most likely, given recent treatment not yet finished.  Viral URI or secondary pneumonia less likely given lack of fever, chills, other systemic symptoms.  PE considered, but CTA PE study negative.  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  Patient currently symptomatic with fatigue and subjective shortness of breath. Given pertinent history and current anemia of unknown etiology, will admit to FMTS with Dr. Erin Hearing. - Trend Hgb, next Tuesday morning - GI consulted for potential GI bleed - Hold anticoag given risk of bleeding - Continue protonix - Clear liquid diet for now - Transfusion threshold 7 given no CAD  Atrial fibrillation with RVR ([place]) Stable and asymptomatic.   Family seems to believe this is been previously diagnosed, however cannot find records in chart and does not follow-up with cardiology. - Continuous  cardiac Monitoring - Hold anticoag - Start metoprolol 12.5 mg for rate control - Continue to monitor cardiac function - Consider outpatient cards referral  Emphysema of lung ([place]) Recently treated for COPD exacerbation on 7/6 with Augmentin and Prednisone x5 days. Currently subjectively SOB but able to speak and breathe comfortably on RA. - Continue Augmentin (7/11 last dose) - Continue prednisone (7/11 last dose) - Supplemental O2 as needed  Bladder neurogenous Stable. Self-catheterizes. - Continue in/out cath - Continue use of Depends   Right leg DVT [facility]), uncertain age Diagnosed in ED 05/08/2022, started on full dose Eliquis.  Followed by vascular, Dr. Donzetta Matters. - If worsening leg pain or signs of recurrent DVT, will obtain DVT ultrasound - Hold anticoag, use SCDs for prophylaxis  Polyneuropathy associated with underlying disease ([place]) On Gabapentin '300mg'$  TID. - Continue home med  Chronic back pain On Norco 10-'325mg'$  q6h PRN for pain. - Continue home meds as needed for chronic pain  Essential hypertension, benign At home on Losartan '50mg'$  QHS, HCTZ '25mg'$  daily. Hold home meds given normal blood pressure and new metoprolol for concurrent A-fib  Pulmonary nodules Incidental.  Noted on CT angiography, infectious vs malignancy. Patient is dyspneic, but it has improved with o/p COPD exacerbation treatment. - Recommend repeat chest CT in 3 months   FEN/GI: Clear liquids given GI likely scope VTE Prophylaxis: SCDs  Disposition: Med-tele   History of Present Illness:  Nicole Perez is a 88 y.o. female presenting with fatigue and SOB who was found to be anemic to 9.1 from her baseline of 70 (confirmed in May).   Two daughters and one great grand daughter in room at bedside Fatigue, SOB for a week.  Some productive cough with a lot of greenish phlegm being treated with azithro and prednisone o/p for COPD exacerbation Reports night sweats over last week as well No blood noticed in  urine or stool. No blood in phlegm. No fevers, chills, n/v   Hospitalized in May with DVT, started on Eliquis Does have remote history of A. Fib, diagnosed a long time ago, no medications currently Last saw cardiology in 2015 per Epic, Dr. Debara Pickett Hospitalized in [place] with GI bleed back in 2017. Transferred to [facility] for clipping of AV malformation in jejunum.  Had recurrent bleed at [facility] shortly after first clipping where she needed transfusion and enteroscopy where a briskly bleeding Dieulafoy lesion was found in the jejunum with prior clips not present. 7 clips were then placed.   In the ED, she received CTA with no evidence of PE but did find pulmonary nodules. FOBT was also positive and given IV protonix.   Review Of Systems: Per HPI.   Pertinent Past Medical History: Right leg DVT, Hx of lower GI bleed, Emphysema, Chronic pain, Severe spinal stenosis, HTN, aortic aneurysm, polyneuropathy, neurogenic bladder. Remainder reviewed in history tab.    Pertinent Past Surgical History: Colonic embolization, bladder suspension, laminectomy   2017 GI performed clipping x2 of actively bleeding Dieulafoy lesion of jejunum Remainder reviewed in history tab.    Pertinent Social History: Tobacco use: Former 0.25 PPD for 20 years Alcohol use: None Other Substance use: None   Pertinent Family History: Reviewed in history tab.    Important Outpatient Medications: Eliquis '5mg'$  BID Gabapentin '300mg'$  TID Docusate '100mg'$  BID HCTZ '25mg'$  daily Norco 10-'325mg'$  q6h PRN Losartan '25mg'$  QHS Prednisone '40mg'$  daily (end date 7/11) Augmentin 875-125 BID (end date 7/11) Remainder reviewed in medication history.    Objective: BP 123/82 (BP Location: Right Arm)   Pulse (!) 103   Temp 98.3 F (36.8 C) (Oral)   Resp 15   LMP 12/17/1962   SpO2 98%  Exam: General: Lying in bed, in NAD, conversant, hard of hearing with hearing aid Eyes: Anicteric, EOM grossly intact ENT: Seb Ks present over face and  neck Cardiovascular: Irregularly irregular rate and rhythm, no m/r/g Respiratory: CTAB Gastrointestinal: Non-distended, soft, nontender, normoactive bowel sounds MSK: Moves all extremities grossly equally Derm: No other noticeable lesions except as above Neuro: Alert and oriented, hard of hearing Psych: Euthymic mood and affect  Labs:  CBC BMET  Recent Labs  Lab 06/25/22 0958  WBC 5.6  HGB 8.5*  HCT 26.4*  PLT 445*   Recent Labs  Lab 06/24/22 1820  NA 133*  K 4.1  CL 97*  CO2 27  BUN 30*  CREATININE 0.96  GLUCOSE 125*  CALCIUM 9.5     Troponins negative x2, UA negative, FOBT positive   EKG: A. fib with RVR, rate 117, normal Qtc of 401, no ST elevation   Imaging Studies Performed: CT ANGIOGRAPHY CHEST WITH CONTRAST 06/24/22 IMPRESSION: 1. No large central pulmonary embolus. There is limited evaluation of the distal pulmonary arteries due to mixing artifact. 2. Scattered pulmonary nodules bilaterally. While findings may be infectious or inflammatory, the possibility of underlying neoplasm can not be excluded. Consider one of the following in 3 months for both low-risk and high-risk individuals: (a) repeat chest CT, (b) follow-up PET-CT, or (c) tissue sampling. This recommendation follows the consensus statement: Guidelines for Management of Incidental Pulmonary Nodules Detected on CT Images: From the Fleischner Society 2017; Radiology 2017; 284:228-243. 3. Aortic atherosclerosis with aneurysmal dilatation of  the descending thoracic aorta measuring 4.3 cm. Recommend semi-annual imaging followup by CTA or MRA and referral to cardiothoracic surgery if not already obtained. This recommendation follows 2010 ACCF/AHA/AATS/ACR/ASA/SCA/SCAI/SIR/STS/SVM Guidelines for the Diagnosis and Management of Patients With Thoracic Aortic Disease. Circulation. 2010; 121: U122-U41. Aortic aneurysm NOS (ICD10-I71.9) 4. Emphysema. 5. Coronary artery calcifications.   CHEST - 2 VIEW  06/22/2022 No focal consolidations or signs of infection  Ethelene Hal, MD 06/25/2022, 4:59 PM PGY-1, [place] Intern pager: [phone], text pages welcome Secure chat group [facility] Teaching Service   Upper Level Addendum: I have seen and evaluated this patient along with Dr. Marcha Dutton and reviewed the above note, making necessary revisions as appropriate. I agree with the medical decision making and physical exam as noted above. Ezequiel Essex, MD PGY-3 [facility]

## 2022-06-25 NOTE — ED Notes (Signed)
Handoff report given to Parks Ranger on 2C at [facility]

## 2022-06-25 NOTE — Progress Notes (Signed)
FMTS Brief Progress Note  S: In to check on patient. HR on tele 130s initially. She denies any chest pain, palpitations. Does feel a little short of breath. She notes that she spoke with her grandsons who had a "fit" when they saw the sign on the door that she was a partial code. She would like to be changed to DNR/DNI. This was confirmed with her a couple times.    O: BP 119/81 (BP Location: Right Arm)   Pulse (!) 113   Temp 98.2 F (36.8 C) (Oral)   Resp (!) 22   LMP 12/17/1962   SpO2 98%   Gen: NAD, pleasant, conversant  Resp: CTAB, normal work of breathing Cards: A-fib w/ RVR, HR 110s-130s during encounter  A/P: A-fib with RVR Asymptomatic but will try to obtain better rate control. Will give PO Metoprolol dose early. If no improvement and HR persistently over 110, will consider dilt gtt. D/w RN regarding HR, will let us know if persistently elevated.   Anemia Eliquis is being held. Transfusion threshold 7. Will follow up AM CBC.   SOB Normal work of breathing. Lung exam unremarkable. Continue abx for COPD.   - Orders reviewed. Labs for AM ordered, which was adjusted as needed.  - If condition changes, plan includes dilt gtt, repeat CBC if evidence of worsening bleed.   Sharion Settler, DO 06/25/2022, 8:46 PM PGY-3, [facility] Night Resident  Please page [phone] with questions.

## 2022-06-25 NOTE — Assessment & Plan Note (Addendum)
Stable and asymptomatic. Rate controlled with metoprolol. Family seems to believe this is been previously diagnosed, however cannot find records in chart and does not follow-up with cardiology. TSH 4.043, echo overall unremarkable. - Continuous cardiac Monitoring - Hold anticoag - Continue metoprolol 12.5 mg for rate control - Continue to monitor cardiac function - Consider outpatient cards referral

## 2022-06-25 NOTE — Telephone Encounter (Signed)
I spoke with Ms Quentin Cornwall (patient's daughter).   She is at ED with Ms Burgueno. Her fatigue did not improve.  Her hgb has declined further and her heart rhythm is irregular.  She has been admitted to hospital

## 2022-06-25 NOTE — ED Notes (Signed)
Family member to desk - per family member patient self-catherizes at home - educated that staff can perform In and Out caths as needed -- family member also asking if she needs adult briefs brought in from home; educated family member that we do carry adult briefs however if they do not fit properly we have chux that can be placed underneath patient; also educated family member that inpatient side may not allow briefs however if they are allowed and patient prefers her adult briefs then she can use her own.

## 2022-06-25 NOTE — Assessment & Plan Note (Addendum)
Incidental.  Noted on CT angiography, infectious vs malignancy. Patient is dyspneic, but it has improved with o/p COPD exacerbation treatment. - Recommend repeat chest CT in 3 months

## 2022-06-25 NOTE — Assessment & Plan Note (Addendum)
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  Patient currently symptomatic with fatigue and subjective shortness of breath. Given pertinent history and current anemia of unknown etiology, will admit to FMTS with Dr. Erin Hearing. - Trend Hgb, next Tuesday morning - GI consulted for potential GI bleed - Hold anticoag given risk of bleeding - Continue protonix - Clear liquid diet for now - Transfusion threshold 7 given no CAD

## 2022-06-25 NOTE — Assessment & Plan Note (Addendum)
At home on Losartan '50mg'$  QHS, HCTZ '25mg'$  daily. Hold home meds given normal blood pressure and new metoprolol for concurrent A-fib

## 2022-06-25 NOTE — H&P (Shared)
Hospital Admission History and Physical Service Pager: [phone]  Patient name: Nicole Perez Medical record number: [id number] Date of Birth: 05-09-1933 Age: 88 y.o. Gender: female  Primary Care Provider: McDiarmid, Blane Ohara, MD Consultants: GI Code Status: Partial code - intubation only, no cardiac resuscitation   Preferred Emergency Contact: [name] (primary, daughter): [phone] Kym Groom (primary, daughter): [phone]  Chief Complaint: Fatigue, shortness of breath  Assessment and Plan: Nicole Perez is a 88 y.o. female presenting with fatigue and shortness of breath. Differential for this patient's presentation of this includes Afib, GI bleed (FOBT positive), COPD exacerbation, PE (unlikely d/t negative CTA).  Anemia  Hemoccult positive Hx of lower GI bleed, currently asymptomatic. Hgb below baseline (11) to 8.5, but stable. Given continued decline in Hgb, will admit to FMT[facility] with Dr. Erin Hearing. - Trend Hgb - GI consulted for potential GI bleed - Hold anticoag given risk of bleeding - Continue protonix  Afib with RVR Stable and asymptomatic. HR in the low 100s, not on rate control at home. Currently in afib with mild RVR. Does not seem to follow with cards outpatient. - Cardiac Monitoring - Hold anticoag - Metoprolol 12.5 mg for rate control, hold home losartan given BP effects - Continue to monitor cardiac function - Consider outpatient cards referral  COPD, recent acute exacerbation Emphysematous changes on chest CT in 2021. Recently treated for exacerbation on 7/6 with Augmentin and Prednisone x5 days. Currently subjectively SOB but able to speak and breathe comfortably on RA. - Continue Augmentin (7/11 last dose) - Continue prednisone (7/11 last dose) - Supplemental O2 as needed  Recent DVT of RLE Tibial veins Seen by vascular surgery on 5/23, being treated with anticoagulation. - If worsening leg pain, get ABI's  - Hold anticoag, use SCDs for  prophylaxis  Pulmonary nodules Noted on CT angiography, infectious vs malignancy. Patient is dyspneic, but it has improved with o/p COPD exacerbation treatment. - Recommend repeat chest CT in 3 months  Chronic conditions Neurogenic bladder - self-catheterizes HTN- Losartan '50mg'$  QHS, HCTZ '25mg'$  daily Chronic back pain/Severe spinal stenosis - Norco 10-'325mg'$  q6h PRN Neuropathy - Gabapentin '300mg'$  TID  FEN/GI: Clear liquids given GI likely scope VTE Prophylaxis: SCDs  Disposition: Med-tele  History of Present Illness:  Nicole Perez is a 88 y.o. female presenting with fatigue and SOB who was found to be anemic to 9.1 from her baseline of 11.  Two daughters and one great grand daughter in room at bedside Fatigue, SOB for a week. Some productive cough with a lot of greenish phlegm being treated with azithro and prednisone o/p for COPD exacerbation Reports night sweats over last week as well No blood noticed in urine or stool. No blood in phlegm. No fevers, chills, n/v  Hospitalized in May with DVT, started on Eliquis Does have remote history of A. Fib, diagnosed a long time ago, no medications currently Last saw cardiology in 2015 per Epic, Dr. Debara Pickett Hospitalized in [place] with GI bleed back in 2017. 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 7 clips were then placed.  In the ED, she received CTA with no evidence of PE but did find pulmonary nodules. FOBT was also positive and given IV protonix.  Review Of Systems: Per HPI.  Pertinent Past Medical  History: Right leg DVT, Hx of lower GI bleed, Emphysema, Chronic pain, Severe spinal stenosis, HTN, aortic aneurysm, polyneuropathy, neurogenic bladder. Remainder reviewed in history tab.   Pertinent Past Surgical  History: Colonic embolization, bladder suspension, laminectomy   Remainder reviewed in history tab.   Pertinent Social History: Tobacco use: Former 0.25 PPD for 20 years Alcohol use: None Other Substance use: None  Pertinent Family History: Reviewed in history tab.   Important Outpatient Medications: Eliquis '5mg'$  BID Gabapentin '300mg'$  TID Docusate '100mg'$  BID HCTZ '25mg'$  daily Norco 10-'325mg'$  q6h PRN Losartan '25mg'$  QHS Prednisone '40mg'$  daily (end date 7/11) Augmentin 875-125 BID (end date 7/11) Remainder reviewed in medication history.   Objective: BP 123/82 (BP Location: Right Arm)   Pulse (!) 103   Temp 98.3 F (36.8 C) (Oral)   Resp 15   LMP 12/17/1962   SpO2 98%  Exam: General: Lying in bed, in NAD, conversant Eyes: Anicteric, EOM grossly intact ENT: Seb Ks present over face and neck Cardiovascular: Irregularly irregular rate and rhythm, no m/r/g Respiratory: CTAB Gastrointestinal: Non-distended, soft, nontender, normoactive bowel sounds MSK: Moves all extremities grossly equally Derm: No other noticeable lesions except as above Neuro: Alert and oriented, hard of hearing Psych: Euthymic mood and affect  Labs:  CBC BMET  Recent Labs  Lab 06/25/22 0958  WBC 5.6  HGB 8.5*  HCT 26.4*  PLT 445*   Recent Labs  Lab 06/24/22 1820  NA 133*  K 4.1  CL 97*  CO2 27  BUN 30*  CREATININE 0.96  GLUCOSE 125*  CALCIUM 9.5     Troponins negative, UA negative, FOBT positive  EKG: My own interpretation (not copied from electronic read): afib with RVR   Imaging Studies Performed:  IMPRESSION: 1. No large central pulmonary embolus. There is limited evaluation of the distal pulmonary arteries due to mixing artifact. 2. Scattered pulmonary nodules bilaterally. While findings may be infectious or inflammatory, the possibility of underlying neoplasm can not be excluded. Consider one of the following in 3 months for both low-risk and high-risk individuals: (a) repeat  chest CT, (b) follow-up PET-CT, or (c) tissue sampling. This recommendation follows the consensus statement: Guidelines for Management of Incidental Pulmonary Nodules Detected on CT Images: From the Fleischner Society 2017; Radiology 2017; 284:228-243. 3. Aortic atherosclerosis with aneurysmal dilatation of the descending thoracic aorta measuring 4.3 cm. Recommend semi-annual imaging followup by CTA or MRA and referral to cardiothoracic surgery if not already obtained. This recommendation follows 2010 ACCF/AHA/AATS/ACR/ASA/SCA/SCAI/SIR/STS/SVM Guidelines for the Diagnosis and Management of Patients With Thoracic Aortic Disease. Circulation. 2010; 121: A630-Z60. Aortic aneurysm NOS (ICD10-I71.9) 4. Emphysema. 5. Coronary artery calcifications.  CXR - No focal consolidations or signs of infection  Jacelyn Grip, MD 06/25/2022, 4:28 PM PGY-1, [facility] pager: [phone], text pages welcome Secure chat group [place]

## 2022-06-25 NOTE — Assessment & Plan Note (Addendum)
Recently treated for COPD exacerbation on 7/6 with Augmentin and Prednisone x5 days. Currently subjectively SOB but able to speak and breathe comfortably on RA. - Continue Augmentin (7/11 last dose) - Continue prednisone (7/11 last dose) - Supplemental O2 as needed

## 2022-06-25 NOTE — Consult Note (Cosign Needed)
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 unusual bleeding or bruising Transfusions: See HPI. Neuro:  No headaches, no peripheral tingling or numbness.  No dizziness, no syncope. MS: Complains of leg pain. Derm:  No itching, no rash or sores.  Endocrine:  No sweats or chills.  No polyuria or dysuria Immunization:   Travel: Queried.   PHYSICAL EXAM: Vital signs in last 24 hours: Vitals:   06/25/22 1500 06/25/22 1608  BP: 127/77 123/82  Pulse:  (!) 103  Resp: 19 15  Temp:  98.3 F (36.8 C)  SpO2:     Wt Readings from Last 3 Encounters:  06/21/22 65.3 kg  06/14/22 65.3 kg  04/11/22 68.9 kg    General: Patient looks aged, frail.  She is hard of  hearing. Head: No facial asymmetry or swelling.  No signs of head trauma. Eyes: Conjunctiva pink. Ears: Hard of hearing but able to understand if you speak loud, slowly, clearly. Nose: No congestion or discharge. Mouth: Tongue is midline.  Mucosa is pink, moist, clear. Neck: JVD, no masses, no thyromegaly. Lungs: Active cough with clear sputum.  Diminished breath sounds Heart: RRR.  No MRG.  S1, S2 present Abdomen: Soft.  Not tender or distended.  No HSM, masses, bruits, hernias.   Rectal: Deferred. Musc/Skeltl: No joint redness, swelling or gross deformity. Extremities: No CCE Neurologic: Hard of hearing.  Appropriate.  Not confused.  Moves all 4 limbs.  Weakness on right lower extremity. Skin: No rash, no sores, no significant bruising.  No telangiectasia. Nodes: No cervical adenopathy Psych: Calm, cooperative.  Intake/Output from previous day: No intake/output data recorded. Intake/Output this shift: No intake/output data recorded.  LAB RESULTS: Recent Labs    06/24/22 1820 06/25/22 0958  WBC 7.4 5.6  HGB 9.1* 8.5*  HCT 28.4* 26.4*  PLT 439* 445*   BMET Lab Results  Component Value Date   NA 133 (L) 06/24/2022   NA 141 06/21/2022   NA 140 05/08/2022   K 4.1 06/24/2022   K 3.9 06/21/2022   K 3.1 (L) 05/08/2022   CL 97 (L) 06/24/2022   CL 100 06/21/2022   CL 105 05/08/2022   CO2 27 06/24/2022   CO2 23 06/21/2022   CO2 27 05/08/2022   GLUCOSE 125 (H) 06/24/2022   GLUCOSE 115 (H) 06/21/2022   GLUCOSE 104 (H) 05/08/2022   BUN 30 (H) 06/24/2022   BUN 39 (H) 06/21/2022   BUN 33 (H) 05/08/2022   CREATININE 0.96 06/24/2022   CREATININE 1.43 (H) 06/21/2022   CREATININE 1.12 (H) 05/08/2022   CALCIUM 9.5 06/24/2022   CALCIUM 9.0 06/21/2022   CALCIUM 9.6 05/08/2022   LFT No results for input(s): "PROT", "ALBUMIN", "AST", "ALT", "ALKPHOS", "BILITOT", "BILIDIR", "IBILI" in the last 72 hours. PT/INR Lab Results  Component Value Date   INR 1.16 05/01/2016   INR  1.14 04/30/2016   INR 1.10 04/25/2016  Hepatitis Panel No results for input(s): "HEPBSAG", "HCVAB", "HEPAIGM", "HEPBIGM" in the last 72 hours. C-Diff No components found for: "CDIFF" Lipase  No results found for: "LIPASE"  Drugs of Abuse  No results found for: "LABOPIA", "COCAINSCRNUR", "LABBENZ", "AMPHETMU", "THCU", "LABBARB"   RADIOLOGY STUDIES: CT Angio Chest PE W and/or Wo Contrast  Result Date: 06/24/2022 CLINICAL DATA:  Pulmonary embolism suspected, high probability. Fatigue and decreased p.o. intake. History of blood clots. EXAM: CT ANGIOGRAPHY CHEST WITH CONTRAST TECHNIQUE: Multidetector CT imaging of the chest was performed using the standard protocol during bolus administration of intravenous contrast. Multiplanar CT image reconstructions and MIPs were obtained to evaluate the vascular anatomy. RADIATION DOSE REDUCTION: This exam was performed according to the departmental dose-optimization program which includes automated exposure control, adjustment of the mA and/or kV according to patient size and/or use of iterative reconstruction technique. CONTRAST:  189m OMNIPAQUE IOHEXOL 350 MG/ML SOLN COMPARISON:  06/06/2020. FINDINGS: Cardiovascular: The heart is normal in size and there is no pericardial effusion. Multi-vessel coronary artery calcifications are noted. There is atherosclerotic calcification of the aorta with aneurysmal dilatation of the proximal descending aorta measuring 4.1 x 4.3 cm. The distal descending aorta measures 2.9 x 3.3 cm. The pulmonary trunk is normal in caliber. No large central pulmonary embolus is identified. Evaluation of the segmental and subsegmental pulmonary arteries is limited due to mixing artifact. Mediastinum/Nodes: Enlarged lymph nodes are present in the mediastinum measuring up to 1 cm in short axis diameter. A prominent lymph node is noted at the right hilum measuring 1.3 cm. No left hilar or axillary lymphadenopathy by size criteria. The thyroid  gland, trachea, and esophagus are within normal limits. Lungs/Pleura: Emphysematous changes are present in the lungs. Scattered pulmonary nodules are present bilaterally. The largest nodule on the right is in the posterior segment of the right upper lobe measuring 1 cm and is ill-defined, axial image 27. The largest nodule in the left measures 1.3 cm and is ill-defined, axial image 31. No consolidation, effusion, or pneumothorax. Upper Abdomen: No acute abnormality. Musculoskeletal: Degenerative changes are present in the thoracic spine. No acute osseous abnormality. Review of the MIP images confirms the above findings. IMPRESSION: 1. No large central pulmonary embolus. There is limited evaluation of the distal pulmonary arteries due to mixing artifact. 2. Scattered pulmonary nodules bilaterally. While findings may be infectious or inflammatory, the possibility of underlying neoplasm can not be excluded. Consider one of the following in 3 months for both low-risk and high-risk individuals: (a) repeat chest CT, (b) follow-up PET-CT, or (c) tissue sampling. This recommendation follows the consensus statement: Guidelines for Management of Incidental Pulmonary Nodules Detected on CT Images: From the Fleischner Society 2017; Radiology 2017; 284:228-243. 3. Aortic atherosclerosis with aneurysmal dilatation of the descending thoracic aorta measuring 4.3 cm. Recommend semi-annual imaging followup by CTA or MRA and referral to cardiothoracic surgery if not already obtained. This recommendation follows 2010 ACCF/AHA/AATS/ACR/ASA/SCA/SCAI/SIR/STS/SVM Guidelines for the Diagnosis and Management of Patients With Thoracic Aortic Disease. Circulation. 2010; 121:: J009-F81 Aortic aneurysm NOS (ICD10-I71.9) 4. Emphysema. 5. Coronary artery calcifications. Electronically Signed   By: LBrett FairyM.D.   On: 06/24/2022 21:50    \ IMPRESSION:     FOBT + anemia.  Suspect recurrent bleeding from AVMs.  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  and ultimately underwent embolization of pancreaticoduodenal branch artery at and spiral enteroscopy with clipping of jejunal lesion.  Pt not on PPI etc.    Eliquis  for DVT, started 2 m ago.  Last dose was 7/9.  No PE on yesterday's CT scan.  Acute respiratory illness.  MD suspected COPD flare and started Augmentin last week.  Also prescribed prednisone but pt did not take it because she does not like the way it makes her feel.  CT chest negative for PE.  Confirms scattered pulmonary nodules, mediastinal lymphadenopathy, emphysema.    Adenomatous and HP colon polyps.  Last colonoscopy 03/2015 w SSA and TA polyps.    PLAN:     EGD versus SBE when patient's respiratory status improves.  Liberalize diet to soft type.  Protonix 40 mg p.o. daily though this is not likely to affect bleeding from suspected AVMs it is judicious.  CBC in AM.   Azucena Freed  06/25/2022, 4:50 PM Phone [phone]

## 2022-06-25 NOTE — Progress Notes (Signed)
Family medicine teaching service will be admitting this patient. Our pager information can be located in the physician sticky notes, treatment team sticky notes, and the headers of all our official daily progress notes.   FAMILY MEDICINE TEACHING SERVICE Patient - Please contact intern pager [phone] or text page via website [email or web address] (login: [email or web address]) for questions regarding care. DO NOT page listed attending provider unless there is no answer from the number above.   Nattie Lazenby, MD PGY-3, [facility] pager [phone]   

## 2022-06-26 ENCOUNTER — Observation Stay (HOSPITAL_COMMUNITY): Payer: Medicare Other

## 2022-06-26 ENCOUNTER — Inpatient Hospital Stay (HOSPITAL_COMMUNITY): Payer: Medicare Other

## 2022-06-26 DIAGNOSIS — Z79899 Other long term (current) drug therapy: Secondary | ICD-10-CM | POA: Diagnosis not present

## 2022-06-26 DIAGNOSIS — N1832 Chronic kidney disease, stage 3b: Secondary | ICD-10-CM | POA: Insufficient documentation

## 2022-06-26 DIAGNOSIS — D649 Anemia, unspecified: Secondary | ICD-10-CM | POA: Diagnosis not present

## 2022-06-26 DIAGNOSIS — N319 Neuromuscular dysfunction of bladder, unspecified: Secondary | ICD-10-CM | POA: Diagnosis present

## 2022-06-26 DIAGNOSIS — N1831 Chronic kidney disease, stage 3a: Secondary | ICD-10-CM | POA: Insufficient documentation

## 2022-06-26 DIAGNOSIS — J441 Chronic obstructive pulmonary disease with (acute) exacerbation: Secondary | ICD-10-CM | POA: Diagnosis not present

## 2022-06-26 DIAGNOSIS — I4891 Unspecified atrial fibrillation: Secondary | ICD-10-CM

## 2022-06-26 DIAGNOSIS — I82409 Acute embolism and thrombosis of unspecified deep veins of unspecified lower extremity: Secondary | ICD-10-CM

## 2022-06-26 DIAGNOSIS — F1190 Opioid use, unspecified, uncomplicated: Secondary | ICD-10-CM

## 2022-06-26 DIAGNOSIS — R918 Other nonspecific abnormal finding of lung field: Secondary | ICD-10-CM

## 2022-06-26 DIAGNOSIS — I480 Paroxysmal atrial fibrillation: Secondary | ICD-10-CM | POA: Diagnosis present

## 2022-06-26 DIAGNOSIS — K6381 Dieulafoy lesion of intestine: Secondary | ICD-10-CM | POA: Diagnosis not present

## 2022-06-26 DIAGNOSIS — K922 Gastrointestinal hemorrhage, unspecified: Secondary | ICD-10-CM

## 2022-06-26 DIAGNOSIS — I129 Hypertensive chronic kidney disease with stage 1 through stage 4 chronic kidney disease, or unspecified chronic kidney disease: Secondary | ICD-10-CM | POA: Diagnosis present

## 2022-06-26 DIAGNOSIS — I10 Essential (primary) hypertension: Secondary | ICD-10-CM | POA: Diagnosis not present

## 2022-06-26 DIAGNOSIS — N183 Chronic kidney disease, stage 3 unspecified: Secondary | ICD-10-CM

## 2022-06-26 DIAGNOSIS — D6832 Hemorrhagic disorder due to extrinsic circulating anticoagulants: Secondary | ICD-10-CM | POA: Diagnosis present

## 2022-06-26 DIAGNOSIS — Z8701 Personal history of pneumonia (recurrent): Secondary | ICD-10-CM | POA: Diagnosis not present

## 2022-06-26 DIAGNOSIS — I7123 Aneurysm of the descending thoracic aorta, without rupture: Secondary | ICD-10-CM | POA: Diagnosis present

## 2022-06-26 DIAGNOSIS — Z8719 Personal history of other diseases of the digestive system: Secondary | ICD-10-CM | POA: Diagnosis not present

## 2022-06-26 DIAGNOSIS — Z86718 Personal history of other venous thrombosis and embolism: Secondary | ICD-10-CM | POA: Diagnosis not present

## 2022-06-26 DIAGNOSIS — J449 Chronic obstructive pulmonary disease, unspecified: Secondary | ICD-10-CM | POA: Diagnosis not present

## 2022-06-26 DIAGNOSIS — G63 Polyneuropathy in diseases classified elsewhere: Secondary | ICD-10-CM | POA: Diagnosis present

## 2022-06-26 DIAGNOSIS — Z66 Do not resuscitate: Secondary | ICD-10-CM | POA: Diagnosis not present

## 2022-06-26 DIAGNOSIS — Z823 Family history of stroke: Secondary | ICD-10-CM | POA: Diagnosis not present

## 2022-06-26 DIAGNOSIS — J439 Emphysema, unspecified: Secondary | ICD-10-CM | POA: Diagnosis present

## 2022-06-26 DIAGNOSIS — N179 Acute kidney failure, unspecified: Secondary | ICD-10-CM | POA: Diagnosis present

## 2022-06-26 DIAGNOSIS — K31811 Angiodysplasia of stomach and duodenum with bleeding: Secondary | ICD-10-CM | POA: Diagnosis present

## 2022-06-26 DIAGNOSIS — D62 Acute posthemorrhagic anemia: Secondary | ICD-10-CM | POA: Diagnosis present

## 2022-06-26 DIAGNOSIS — K31819 Angiodysplasia of stomach and duodenum without bleeding: Secondary | ICD-10-CM | POA: Diagnosis not present

## 2022-06-26 DIAGNOSIS — Z832 Family history of diseases of the blood and blood-forming organs and certain disorders involving the immune mechanism: Secondary | ICD-10-CM | POA: Diagnosis not present

## 2022-06-26 DIAGNOSIS — Z8249 Family history of ischemic heart disease and other diseases of the circulatory system: Secondary | ICD-10-CM | POA: Diagnosis not present

## 2022-06-26 DIAGNOSIS — Z7901 Long term (current) use of anticoagulants: Secondary | ICD-10-CM | POA: Diagnosis not present

## 2022-06-26 DIAGNOSIS — Z87891 Personal history of nicotine dependence: Secondary | ICD-10-CM | POA: Diagnosis not present

## 2022-06-26 DIAGNOSIS — T45515A Adverse effect of anticoagulants, initial encounter: Secondary | ICD-10-CM | POA: Diagnosis present

## 2022-06-26 DIAGNOSIS — Z803 Family history of malignant neoplasm of breast: Secondary | ICD-10-CM | POA: Diagnosis not present

## 2022-06-26 DIAGNOSIS — Z20822 Contact with and (suspected) exposure to covid-19: Secondary | ICD-10-CM | POA: Diagnosis present

## 2022-06-26 LAB — CBC
HCT: 27.6 % — ABNORMAL LOW (ref 36.0–46.0)
Hemoglobin: 8.9 g/dL — ABNORMAL LOW (ref 12.0–15.0)
MCH: 28.3 pg (ref 26.0–34.0)
MCHC: 32.2 g/dL (ref 30.0–36.0)
MCV: 87.9 fL (ref 80.0–100.0)
Platelets: 428 10*3/uL — ABNORMAL HIGH (ref 150–400)
RBC: 3.14 MIL/uL — ABNORMAL LOW (ref 3.87–5.11)
RDW: 13.4 % (ref 11.5–15.5)
WBC: 5.8 10*3/uL (ref 4.0–10.5)
nRBC: 0 % (ref 0.0–0.2)

## 2022-06-26 LAB — ECHOCARDIOGRAM COMPLETE
Height: 59 in
S' Lateral: 1.8 cm

## 2022-06-26 LAB — BASIC METABOLIC PANEL
Anion gap: 16 — ABNORMAL HIGH (ref 5–15)
BUN: 26 mg/dL — ABNORMAL HIGH (ref 8–23)
CO2: 22 mmol/L (ref 22–32)
Calcium: 8.9 mg/dL (ref 8.9–10.3)
Chloride: 102 mmol/L (ref 98–111)
Creatinine, Ser: 1.18 mg/dL — ABNORMAL HIGH (ref 0.44–1.00)
GFR, Estimated: 45 mL/min — ABNORMAL LOW (ref >60)
Glucose, Bld: 103 mg/dL — ABNORMAL HIGH (ref 70–99)
Potassium: 4.7 mmol/L (ref 3.5–5.1)
Sodium: 140 mmol/L (ref 135–145)

## 2022-06-26 LAB — TSH: TSH: 4.043 u[IU]/mL (ref 0.350–4.500)

## 2022-06-26 MED ORDER — ENSURE ENLIVE PO LIQD
237.0000 mL | Freq: Two times a day (BID) | ORAL | Status: DC
  Administered 2022-06-26 – 2022-06-28 (×3): 237 mL via ORAL

## 2022-06-26 NOTE — Evaluation (Addendum)
Physical Therapy Evaluation Patient Details Name: Nicole Perez MRN: [id number] DOB: 1933-01-28 Today's Date: 06/26/2022  History of Present Illness  Pt is an 88 y.o. female admitted 06/24/22 with anemia, afib with RVR, presumed COPD exacerbation (recently treated 7/6). CTA with incidental findings of pulmonary nodules, infectious vs malignancy. PMH includes HTN, CKD 3, DVT, aortic aneurysm, chronic pain.   Clinical Impression  Pt presents with an overall decrease in functional mobility secondary to above. PTA, pt mod indep ambulating with RW, managing her ADLs and medications; lives with daughter who assists with household tasks. Today, pt moving well with RW and min guard; pt happy to finally be out of bed and walking. Pt with multiple family members available to assist upon return home; recommend HHPT services to maximize functional mobility and independence. Will follow acutely to address established goals.  HR 115 with ambulation; SpO2 98% via portable pulse ox     Recommendations for follow up therapy are one component of a multi-disciplinary discharge planning process, led by the attending physician.  Recommendations may be updated based on patient status, additional functional criteria and insurance authorization.  Follow Up Recommendations Home health PT      Assistance Recommended at Discharge Frequent or constant Supervision/Assistance  Patient can return home with the following  A little help with walking and/or transfers;A little help with bathing/dressing/bathroom;Assistance with cooking/housework;Assist for transportation;Help with stairs or ramp for entrance    Equipment Recommendations None recommended by PT  Recommendations for Other Services   Mobility Specialist   Functional Status Assessment Patient has had a recent decline in their functional status and demonstrates the ability to make significant improvements in function in a reasonable and predictable amount of time.      Precautions / Restrictions Precautions Precautions: Fall;Other (comment) Precaution Comments: urine incontinence (wears Depends at home) Restrictions Weight Bearing Restrictions: No      Mobility  Bed Mobility Overal bed mobility: Modified Independent                  Transfers Overall transfer level: Needs assistance Equipment used: Rolling walker (2 wheels) Transfers: Sit to/from Stand Sit to Stand: Min guard           General transfer comment: multiple sit<>stands from EOB and recliner to RW, min guard    Ambulation/Gait Ambulation/Gait assistance: Min guard Gait Distance (Feet): 120 Feet Assistive device: Rolling walker (2 wheels) Gait Pattern/deviations: Step-through pattern, Decreased stride length, Trunk flexed Gait velocity: Decreased     General Gait Details: slow, fatigued gait with RW and min guard for balance; pt with good awareness of activity pacing; further distance limited by fatigue  Stairs            Wheelchair Mobility    Modified Rankin (Stroke Patients Only)       Balance Overall balance assessment: Needs assistance   Sitting balance-Leahy Scale: Fair     Standing balance support: No upper extremity supported Standing balance-Leahy Scale: Fair Standing balance comment: can static stand without UE support; preference for RW                             Pertinent Vitals/Pain Pain Assessment Pain Assessment: Faces Faces Pain Scale: Hurts little more Pain Location: bilateral feet Pain Descriptors / Indicators: Discomfort Pain Intervention(s): Monitored during session, Patient requesting pain meds-RN notified    Home Living Family/patient expects to be discharged to:: Private residence Living Arrangements: Children (daughter) Available  Help at Discharge: Family;Available PRN/intermittently Type of Home: House Home Access: Ramped entrance       Home Layout: One level Home Equipment: Conservation officer, nature  (2 wheels);Shower seat;Grab bars - tub/shower Additional Comments: lives with daughter; multiple other family members nearby to assist as needed    Prior Function Prior Level of Function : Independent/Modified Independent             Mobility Comments: typically mod indep ambulating with RW ("she can walk a long ways" per daughter). does not drive ADLs Comments: Mod indep with bathing with use of shower seat. Manages her own medications. Daughter assists with household tasks (cooking, cleaning)     Journalist, newspaper        Extremity/Trunk Assessment   Upper Extremity Assessment Upper Extremity Assessment: Generalized weakness    Lower Extremity Assessment Lower Extremity Assessment: Generalized weakness       Communication   Communication: HOH  Cognition Arousal/Alertness: Awake/alert Behavior During Therapy: WFL for tasks assessed/performed Overall Cognitive Status: Difficult to assess                                 General Comments: WFL for simple tasks with verbal and gestural commands; pt very HOH without hearing aids in        General Comments General comments (skin integrity, edema, etc.): HR 115, SpO2 98% on RA via portable finger probe. multiple daughters and son present, supportive; agreeable to HHPT services    Exercises     Assessment/Plan    PT Assessment Patient needs continued PT services  PT Problem List Decreased strength;Decreased activity tolerance;Decreased balance;Decreased mobility;Cardiopulmonary status limiting activity       PT Treatment Interventions DME instruction;Gait training;Functional mobility training;Therapeutic activities;Therapeutic exercise;Patient/family education    PT Goals (Current goals can be found in the Care Plan section)  Acute Rehab PT Goals Patient Stated Goal: return home PT Goal Formulation: With patient Time For Goal Achievement: 07/10/22 Potential to Achieve Goals: Good    Frequency Min  3X/week     Co-evaluation               AM-PAC PT "6 Clicks" Mobility  Outcome Measure Help needed turning from your back to your side while in a flat bed without using bedrails?: A Little Help needed moving from lying on your back to sitting on the side of a flat bed without using bedrails?: A Little Help needed moving to and from a bed to a chair (including a wheelchair)?: A Little Help needed standing up from a chair using your arms (e.g., wheelchair or bedside chair)?: A Little Help needed to walk in hospital room?: A Little Help needed climbing 3-5 steps with a railing? : A Little 6 Click Score: 18    End of Session Equipment Utilized During Treatment: Gait belt Activity Tolerance: Patient tolerated treatment well Patient left: in chair;with call bell/phone within reach;with family/visitor present Nurse Communication: Mobility status PT Visit Diagnosis: Other abnormalities of gait and mobility (R26.89);Muscle weakness (generalized) (M62.81)    Time: 4081-4481 PT Time Calculation (min) (ACUTE ONLY): 18 min   Charges:   PT Evaluation $PT Eval Moderate Complexity: [place], PT, DPT Acute Rehabilitation Services  Personal: [place] [facility] Office: [place] 06/26/2022, 11:56 AM

## 2022-06-26 NOTE — Plan of Care (Signed)
  Problem: Clinical Measurements: Goal: Respiratory complications will improve Outcome: Progressing   Problem: Coping: Goal: Level of anxiety will decrease Outcome: Progressing   Problem: Elimination: Goal: Will not experience complications related to bowel motility Outcome: Progressing Goal: Will not experience complications related to urinary retention Outcome: Progressing   Problem: Pain Managment: Goal: General experience of comfort will improve Outcome: Progressing   Problem: Clinical Measurements: Goal: Cardiovascular complication will be avoided Outcome: Not Progressing   Problem: Activity: Goal: Risk for activity intolerance will decrease Outcome: Not Progressing

## 2022-06-26 NOTE — Progress Notes (Signed)
  Echocardiogram 2D Echocardiogram has been performed.  Nicole Perez 06/26/2022, 3:07 PM

## 2022-06-26 NOTE — Progress Notes (Signed)
Bilateral LE venous duplex study completed. Please see CV Proc for preliminary results.  Coy Rochford BS, RVT 06/26/2022 10:49 AM

## 2022-06-26 NOTE — Assessment & Plan Note (Signed)
Cr 1.18, GFR 45, at baseline. - Continue PO intake

## 2022-06-26 NOTE — Plan of Care (Signed)
  Problem: Clinical Measurements: Goal: Diagnostic test results will improve Outcome: Progressing Goal: Respiratory complications will improve Outcome: Progressing Goal: Cardiovascular complication will be avoided Outcome: Progressing   Problem: Activity: Goal: Risk for activity intolerance will decrease Outcome: Progressing   Problem: Coping: Goal: Level of anxiety will decrease Outcome: Progressing   Problem: Elimination: Goal: Will not experience complications related to bowel motility Outcome: Progressing Goal: Will not experience complications related to urinary retention Outcome: Progressing   Problem: Pain Managment: Goal: General experience of comfort will improve Outcome: Progressing   Problem: Safety: Goal: Ability to remain free from injury will improve Outcome: Progressing

## 2022-06-26 NOTE — Progress Notes (Addendum)
Daily Progress Note Intern Pager: [phone]  Patient name: Nicole Perez Medical record number: [id number] Date of birth: 03-Jun-1933 Age: 88 y.o. Gender: female  Primary Care Provider: McDiarmid, Blane Ohara, MD Consultants: GI Code Status: Now DNR per night team (7/10)  Pt Overview and Major Events to Date:  7/10 - admitted  Assessment and Plan:  Nicole Perez is a 88 y.o. female who was found to be anemic to 9.1, in afib with RVR to low 100s, and SOB with cough and sputum production on meds for COPD exacerbation. 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  * Symptomatic anemia Hgb stable at 8.9 today (7/11). 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 Patient remains symptomatic with fatigue and subjective shortness of breath **. - Trend Hgb - GI following for EGD  - Hold anticoag given risk of bleeding - Continue protonix - Transfusion threshold 7 given no CAD  Atrial fibrillation with RVR (HCC) Stable and asymptomatic. Rate better with metoprolol. Family seems to believe this is been previously diagnosed, however cannot find records in chart and does not follow-up with cardiology. Will obtain TSH, echo for further evaluation. - F/u TSH, echo results - Continuous cardiac Monitoring - Hold anticoag - Continue metoprolol 12.5 mg for rate control - Continue to monitor cardiac function - Consider outpatient cards referral  Emphysema of lung ([place]) Recently treated for COPD exacerbation on 7/6 with Augmentin and Prednisone x5 days. Currently subjectively SOB but able to speak and breathe comfortably on RA. - Continue Augmentin (7/11 last dose) - Continue prednisone (7/11 last dose) - Supplemental O2 as needed  Bladder neurogenous Stable. Self-catheterizes. - Continue in/out cath - Continue use of Depends   Right leg DVT [facility]), uncertain  age Diagnosed in ED 05/08/2022, started on full dose Eliquis.  Followed by vascular, Dr. Donzetta Matters. - If worsening leg pain or signs of recurrent DVT, will obtain DVT ultrasound - Hold anticoag, use SCDs for prophylaxis - Serial DVTs to monitor since off anticoag  Polyneuropathy associated with underlying disease ([place]) On Gabapentin '300mg'$  TID. - Continue home med  Chronic back pain On Norco 10-'325mg'$  q6h PRN for pain. - Continue home meds as needed for chronic pain  Essential hypertension, benign At home on Losartan '50mg'$  QHS, HCTZ '25mg'$  daily.  - Hold home meds given normal blood pressure and new metoprolol for concurrent A-fib  CKD Stage 3 Cr 1.18, GFR 45, at baseline. - Continue PO intake  Pulmonary nodules Incidental.  Noted on CT angiography, infectious vs malignancy. Patient is dyspneic, but it has improved with o/p COPD exacerbation treatment. - Recommend repeat chest CT in 3 months   FEN/GI: NPO for EGD PPx: SCDs given bleeding Dispo:Pending PT recommendations  pending clinical improvement . Barriers include management of suspected active bleed.   Subjective:  Nicole Perez is feeling much better this morning, as she is having less SOB and not feeling as fatigued. She continues to not have palpitations or dizziness.  Objective: Temp:  [98.1 F (36.7 C)-98.8 F (37.1 C)] 98.6 F (37 C) (07/11 0700) Pulse Rate:  [88-113] 92 (07/11 0700) Resp:  [15-22] 21 (07/11 0700) BP: (119-135)/(65-99) 124/65 (07/11 0700) SpO2:  [96 %-99 %] 96 % (07/11 0700) Physical Exam: General: Sitting up in bed, conversant, in NAD Cardiovascular: Irregularly irregular rhythm, slightly tachycardic Respiratory: Breathing and speaking comfortably on RA Abdomen: Non-distended Extremities:  Moves all extremities grossly equally  Laboratory: Most recent CBC Lab Results  Component Value Date   WBC 5.8 06/26/2022   HGB 8.9 (L) 06/26/2022   HCT 27.6 (L) 06/26/2022   MCV 87.9 06/26/2022   PLT 428 (H)  06/26/2022   Most recent BMP    Latest Ref Rng & Units 06/26/2022    2:06 AM  BMP  Glucose 70 - 99 mg/dL 103   BUN 8 - 23 mg/dL 26   Creatinine 0.44 - 1.00 mg/dL 1.18   Sodium 135 - 145 mmol/L 140   Potassium 3.5 - 5.1 mmol/L 4.7   Chloride 98 - 111 mmol/L 102   CO2 22 - 32 mmol/L 22   Calcium 8.9 - 10.3 mg/dL 8.9    Hgb 9.1>8.5>8.9 Plts 938>182>993  Ethelene Hal, MD 06/26/2022, 10:29 AM PGY-1, [place] Intern pager: [phone], text pages welcome Secure chat group [place]

## 2022-06-27 ENCOUNTER — Inpatient Hospital Stay (HOSPITAL_COMMUNITY): Payer: Medicare Other | Admitting: Anesthesiology

## 2022-06-27 ENCOUNTER — Encounter (HOSPITAL_COMMUNITY)
Admission: EM | Disposition: A | Discharge status: Home Health | Payer: Self-pay | Source: Home / Self Care | Attending: Family Medicine

## 2022-06-27 ENCOUNTER — Encounter (HOSPITAL_COMMUNITY): Payer: Self-pay | Admitting: Family Medicine

## 2022-06-27 DIAGNOSIS — I10 Essential (primary) hypertension: Secondary | ICD-10-CM

## 2022-06-27 DIAGNOSIS — J449 Chronic obstructive pulmonary disease, unspecified: Secondary | ICD-10-CM

## 2022-06-27 DIAGNOSIS — K31819 Angiodysplasia of stomach and duodenum without bleeding: Secondary | ICD-10-CM

## 2022-06-27 DIAGNOSIS — D62 Acute posthemorrhagic anemia: Secondary | ICD-10-CM

## 2022-06-27 DIAGNOSIS — K5520 Angiodysplasia of colon without hemorrhage: Secondary | ICD-10-CM

## 2022-06-27 DIAGNOSIS — D649 Anemia, unspecified: Secondary | ICD-10-CM | POA: Diagnosis not present

## 2022-06-27 DIAGNOSIS — Z87891 Personal history of nicotine dependence: Secondary | ICD-10-CM

## 2022-06-27 HISTORY — PX: HOT HEMOSTASIS: SHX5433

## 2022-06-27 HISTORY — PX: ENTEROSCOPY: SHX5533

## 2022-06-27 LAB — CBC
HCT: 25 % — ABNORMAL LOW (ref 36.0–46.0)
HCT: 28.3 % — ABNORMAL LOW (ref 36.0–46.0)
Hemoglobin: 8.2 g/dL — ABNORMAL LOW (ref 12.0–15.0)
Hemoglobin: 8.6 g/dL — ABNORMAL LOW (ref 12.0–15.0)
MCH: 28.6 pg (ref 26.0–34.0)
MCH: 27.8 pg (ref 26.0–34.0)
MCHC: 32.8 g/dL (ref 30.0–36.0)
MCHC: 30.4 g/dL (ref 30.0–36.0)
MCV: 87.1 fL (ref 80.0–100.0)
MCV: 91.6 fL (ref 80.0–100.0)
Platelets: 387 10*3/uL (ref 150–400)
Platelets: 397 10*3/uL (ref 150–400)
RBC: 2.87 MIL/uL — ABNORMAL LOW (ref 3.87–5.11)
RBC: 3.09 MIL/uL — ABNORMAL LOW (ref 3.87–5.11)
RDW: 13.2 % (ref 11.5–15.5)
RDW: 13.4 % (ref 11.5–15.5)
WBC: 6.8 10*3/uL (ref 4.0–10.5)
WBC: 6.4 10*3/uL (ref 4.0–10.5)
nRBC: 0 % (ref 0.0–0.2)
nRBC: 0 % (ref 0.0–0.2)

## 2022-06-27 LAB — IRON AND TIBC
Iron: 27 ug/dL — ABNORMAL LOW (ref 28–170)
Saturation Ratios: 12 % (ref 10.4–31.8)
TIBC: 234 ug/dL — ABNORMAL LOW (ref 250–450)
UIBC: 207 ug/dL

## 2022-06-27 SURGERY — ENTEROSCOPY
Anesthesia: Monitor Anesthesia Care

## 2022-06-27 MED ORDER — PROPOFOL 10 MG/ML IV BOLUS
INTRAVENOUS | Status: DC | PRN
  Administered 2022-06-27 (×3): 20 mg via INTRAVENOUS
  Administered 2022-06-27: 15 mg via INTRAVENOUS
  Administered 2022-06-27: 30 mg via INTRAVENOUS

## 2022-06-27 MED ORDER — OXYCODONE HCL 5 MG PO TABS: 5.0000 mg | ORAL_TABLET | Freq: Once | ORAL | Status: DC | PRN

## 2022-06-27 MED ORDER — OXYCODONE HCL 5 MG/5ML PO SOLN: 5.0000 mg | Freq: Once | ORAL | Status: DC | PRN

## 2022-06-27 MED ORDER — HYDROMORPHONE HCL 1 MG/ML IJ SOLN: 0.2500 mg | INTRAMUSCULAR | Status: DC | PRN

## 2022-06-27 MED ORDER — PROMETHAZINE HCL 25 MG/ML IJ SOLN: 6.2500 mg | INTRAMUSCULAR | Status: DC | PRN

## 2022-06-27 MED ORDER — PHENYLEPHRINE 80 MCG/ML (10ML) SYRINGE FOR IV PUSH (FOR BLOOD PRESSURE SUPPORT)
PREFILLED_SYRINGE | INTRAVENOUS | Status: DC | PRN
  Administered 2022-06-27: 80 ug via INTRAVENOUS
  Administered 2022-06-27 (×2): 160 ug via INTRAVENOUS
  Administered 2022-06-27: 80 ug via INTRAVENOUS

## 2022-06-27 MED ORDER — HYDROCODONE-ACETAMINOPHEN 10-325 MG PO TABS
1.0000 | ORAL_TABLET | Freq: Two times a day (BID) | ORAL | Status: DC
  Administered 2022-06-27 – 2022-06-28 (×3): 1 via ORAL
  Filled 2022-06-27 (×3): qty 1

## 2022-06-27 MED ORDER — PROPOFOL 500 MG/50ML IV EMUL
INTRAVENOUS | Status: DC | PRN
  Administered 2022-06-27: 100 ug/kg/min via INTRAVENOUS

## 2022-06-27 MED ORDER — LIDOCAINE 2% (20 MG/ML) 5 ML SYRINGE
INTRAMUSCULAR | Status: DC | PRN
  Administered 2022-06-27 (×2): 40 mg via INTRAVENOUS

## 2022-06-27 NOTE — Progress Notes (Signed)
  Progress Note   Date: 06/27/2022  Patient Name: Nicole Perez        MRN#: [id number]  Clarification of the diagnosis of AKI:   AKI resolved

## 2022-06-27 NOTE — Anesthesia Procedure Notes (Signed)
Procedure Name: MAC Date/Time: 06/27/2022 10:25 AM  Performed by: Janace Litten, CRNAPre-anesthesia Checklist: Patient identified, Emergency Drugs available, Suction available and Patient being monitored Oxygen Delivery Method: Nasal cannula

## 2022-06-27 NOTE — Transfer of Care (Signed)
Immediate Anesthesia Transfer of Care Note  Patient: Nicole Perez  Procedure(s) Performed: ENTEROSCOPY HOT HEMOSTASIS (ARGON PLASMA COAGULATION/BICAP)  Patient Location: PACU  Anesthesia Type:MAC  Level of Consciousness: drowsy and responds to stimulation  Airway & Oxygen Therapy: Patient Spontanous Breathing  Post-op Assessment: Report given to RN and Post -op Vital signs reviewed and stable  Post vital signs: Reviewed and stable  Last Vitals:  Vitals Value Taken Time  BP 83/53 06/27/22 1106  Temp    Pulse 88 06/27/22 1107  Resp 25 06/27/22 1107  SpO2 95 % 06/27/22 1107  Vitals shown include unvalidated device data.  Last Pain:  Vitals:   06/27/22 0944  TempSrc: Temporal  PainSc: 7          Complications: No notable events documented.

## 2022-06-27 NOTE — Interval H&P Note (Signed)
History and Physical Interval Note:  No acute events overnight.  H/H slight downtrend at 8.2/25 today (hemoglobin 8.9 yesterday).  BUN slowly improving at 26 (from 30 yesterday and 39 the day prior).  Per notes from primary inpatient team, no plan to restart anticoagulation after hospital discharge.  I again discussed plan for push enteroscopy today with the patient's daughter by phone per patient request.  As below, also discussed with patient in detail and she wishes to proceed.  06/27/2022 10:11 AM  [place]  has presented today for surgery, with the diagnosis of evaluate anemia (low blood counts) and blood detected on stool testing..  The various methods of treatment have been discussed with the patient and family. After consideration of risks, benefits and other options for treatment, the patient has consented to  Procedure(s): ENTEROSCOPY (N/A) as a surgical intervention.  The patient's history has been reviewed, patient examined, no change in status, stable for surgery.  I have reviewed the patient's chart and labs.  Questions were answered to the patient's satisfaction.     Dominic Pea Aroura Vasudevan

## 2022-06-27 NOTE — Plan of Care (Signed)
  Problem: Clinical Measurements: Goal: Will remain free from infection Outcome: Progressing Goal: Diagnostic test results will improve Outcome: Progressing Goal: Respiratory complications will improve Outcome: Progressing Goal: Cardiovascular complication will be avoided Outcome: Progressing   Problem: Activity: Goal: Risk for activity intolerance will decrease Outcome: Progressing   Problem: Coping: Goal: Level of anxiety will decrease Outcome: Progressing   Problem: Elimination: Goal: Will not experience complications related to urinary retention Outcome: Progressing   Problem: Pain Managment: Goal: General experience of comfort will improve Outcome: Progressing   Problem: Safety: Goal: Ability to remain free from injury will improve Outcome: Progressing

## 2022-06-27 NOTE — Anesthesia Preprocedure Evaluation (Signed)
Anesthesia Evaluation  Patient identified by MRN, date of birth, ID band Patient awake    Reviewed: Allergy & Precautions, NPO status , Patient's Chart, lab work & pertinent test results  Airway Mallampati: I  TM Distance: >3 FB Neck ROM: Full    Dental  (+) Edentulous Upper, Edentulous Lower   Pulmonary COPD, former smoker,    breath sounds clear to auscultation       Cardiovascular hypertension, Pt. on medications + Peripheral Vascular Disease   Rhythm:Regular Rate:Normal     Neuro/Psych  Neuromuscular disease negative psych ROS   GI/Hepatic hiatal hernia, GERD  Medicated,  Endo/Other  negative endocrine ROS  Renal/GU Renal disease  negative genitourinary   Musculoskeletal  (+) Arthritis ,   Abdominal   Peds negative pediatric ROS (+)  Hematology  (+) Blood dyscrasia, anemia ,   Anesthesia Other Findings   Reproductive/Obstetrics negative OB ROS                             Anesthesia Physical  Anesthesia Plan  ASA: III  Anesthesia Plan: MAC   Post-op Pain Management: Minimal or no pain anticipated   Induction: Intravenous  PONV Risk Score and Plan: 2 and Ondansetron, Midazolam and Treatment may vary due to age or medical condition  Airway Management Planned: Simple Face Mask  Additional Equipment:   Intra-op Plan:   Post-operative Plan:   Informed Consent: I have reviewed the patients History and Physical, chart, labs and discussed the procedure including the risks, benefits and alternatives for the proposed anesthesia with the patient or authorized representative who has indicated his/her understanding and acceptance.       Plan Discussed with: CRNA  Anesthesia Plan Comments:         Anesthesia Quick Evaluation

## 2022-06-27 NOTE — Progress Notes (Signed)
FMTS Brief Progress Note  S:In to check on patient with Dr. Ruben Im. Patient was resting in bed, lights were off. She states that she is on a soft diet and feels hungry. She thinks she could handle advancement in diet. No other concerns apart from feeling cold.  O: BP 138/82 (BP Location: Right Arm)   Pulse 94   Temp 98 F (36.7 C) (Oral)   Resp 20   Ht '4\' 11"'$  (1.499 m)   Wt 65.3 kg   LMP 12/17/1962   SpO2 100%   BMI 29.08 kg/m   Gen: NAD, pleasant Card: A-fib, rate 70s on tele  Resp: Normal respiratory effort, on RA   A/P: Anemia S/p EGD today with non-bleeding angioectasias that were treated with argon plasma coagulation. Patient is stable from procedure, requesting advancement in diet.  -Advance diet to Regular  A-fib Rate has been controlled. Did not receive morning Metoprolol due to [facility] hold for procedure. Scheduled for night-time dose. Off of Eliquis d/t anemia.  -Continue to monitor HR, keep <110  - Orders reviewed. Labs for AM , which was adjusted as needed.   Sharion Settler, DO 06/27/2022, 9:23 PM PGY-3, [facility] Night Resident  Please page [phone] with questions.

## 2022-06-27 NOTE — Progress Notes (Signed)
Initial Nutrition Assessment  DOCUMENTATION CODES:   Not applicable  INTERVENTION:  Encourage PO intake Ensure Enlive po BID, each supplement provides 350 kcal and 20 grams of protein.  NUTRITION DIAGNOSIS:   Increased nutrient needs related to acute illness as evidenced by estimated needs.  GOAL:   Patient will meet greater than or equal to 90% of their needs  MONITOR:   PO intake, Supplement acceptance, Diet advancement, Labs, Weight trends  REASON FOR ASSESSMENT:   Malnutrition Screening Tool    ASSESSMENT:   Pt admitted with symptomatic anemia, afib with RVR and SOB with sputum production. PMH significant for  DVT, lower GIB, emphysema, chronic pain, severe spinal stenosis, HTN, aortic aneurysm, polyneuropathy, neurogenic bladder.  Pt just returning back to room from small bowel endoscopy. Multiple family members present at bedside. Unable to obtain detailed nutrition/wt history as pt was quite tearful during assessment, in addition she was experiencing a wet cough. RN present at time of visit, addressing needs.  Pt's family reports that she has been eating 3 meals per day PTA and denies changes to intake or appetite recently. Pt's diet advanced to full liquid diet and requesting a meal. Ordered soup, jello and juice at pt request.   Meal completions: 07/11: 75%-breakfast, 50%-lunch, 50%-dinner  Reviewed wt history. Pt is noted to have had a 7.4% wt loss from 03/23-06/29 which is concerning but not quite significant for time frame. Last 3 documented wt's noted to be 65.3 kg. Uncertain if this is stated or actual wt. Will continue to monitor throughout admission.   Medications: protonix  Labs: BUN 26, Cr 1.18, anion gap 16, GFR 45  UOP: 10101m x12 hours  I/O's: -2874msince admit  NUTRITION - FOCUSED PHYSICAL EXAM: Deferred to follow up.   Diet Order:   Diet Order             Diet full liquid Room service appropriate? Yes; Fluid consistency: Thin  Diet  effective now                   EDUCATION NEEDS:   No education needs have been identified at this time  Skin:  Skin Assessment: Reviewed RN Assessment  Last BM:  PTA  Height:   Ht Readings from Last 1 Encounters:  06/27/22 '4\' 11"'$  (1.499 m)    Weight:   Wt Readings from Last 1 Encounters:  06/27/22 65.3 kg    Ideal Body Weight:  44.7 kg  BMI:  Body mass index is 29.08 kg/m.  Estimated Nutritional Needs:   Kcal:  1300-1500  Protein:  70-85g  Fluid:  >/=1.5L  AlClayborne DanaRDN, LDN Clinical Nutrition

## 2022-06-27 NOTE — Anesthesia Postprocedure Evaluation (Signed)
Anesthesia Post Note  Patient: MONTIA HASLIP  Procedure(s) Performed: ENTEROSCOPY HOT HEMOSTASIS (ARGON PLASMA COAGULATION/BICAP)     Patient location during evaluation: PACU Anesthesia Type: MAC Level of consciousness: awake and alert Pain management: pain level controlled Vital Signs Assessment: post-procedure vital signs reviewed and stable Respiratory status: spontaneous breathing, nonlabored ventilation and respiratory function stable Cardiovascular status: blood pressure returned to baseline and stable Postop Assessment: no apparent nausea or vomiting Anesthetic complications: no   No notable events documented.  Last Vitals:  Vitals:   06/27/22 1133 06/27/22 1200  BP: (!) 113/94 (!) 123/91  Pulse: 97   Resp: 19 20  Temp: 36.8 C   SpO2: 96%     Last Pain:  Vitals:   06/27/22 1133  TempSrc: Oral  PainSc: 5                  Lynda Rainwater

## 2022-06-27 NOTE — Op Note (Signed)
[facility] Patient Name: Nicole Perez Procedure Date : 06/27/2022 MRN: [id number] Attending MD: Gerrit Heck , MD Date of Birth: 12-05-1933 CSN: [id number] Age: 88 Admit Type: Inpatient Procedure:                Small bowel enteroscopy Indications:              Acute post hemorrhagic anemia, Hx of AVMs, FOBT+                            stool                           88 year old female with history of GI bleed in the                            past admitted with anemia and heme positive stool.                            Known history of large hiatal hernia and erosive                            gastritis on EGD in 03/2015, duodenal AVMs treated                            with APC in 03/2016 (c/b post APC DU with VV treated                            in 04/2016), jejunal Dieulafoy treated with                            embolization of pancreaticoduodenal branch arteries                            in 04/2016 followed by spiral endoscopy at [facility]                            treated with endoclips in 04/2016.                           H/H stable at 8.9/27.6, from baseline Hgb ~11.                            Eliquis stopped on admission. Providers:                Gerrit Heck, MD, Doristine Johns, RN, Frazier Richards, Technician Referring MD:              Medicines:                Monitored Anesthesia Care Complications:            No immediate complications. Estimated Blood Loss:     Estimated blood loss was minimal. Procedure:  Pre-Anesthesia Assessment:                           - Prior to the procedure, a History and Physical                            was performed, and patient medications and                            allergies were reviewed. The patient's tolerance of                            previous anesthesia was also reviewed. The risks                            and benefits of the procedure and the sedation                             options and risks were discussed with the patient.                            All questions were answered, and informed consent                            was obtained. Prior Anticoagulants: The patient has                            taken Eliquis (apixaban), last dose was 2 days                            prior to procedure. ASA Grade Assessment: III - A                            patient with severe systemic disease. After                            reviewing the risks and benefits, the patient was                            deemed in satisfactory condition to undergo the                            procedure.                           After obtaining informed consent, the endoscope was                            passed under direct vision. Throughout the                            procedure, the patient's blood pressure, pulse, and  oxygen saturations were monitored continuously. The                            [id number]-HQ190L ([id number] Olympus colonoscope was                            introduced through the mouth and advanced to the                            proximal jejunum. The small bowel enteroscopy was                            accomplished without difficulty. The patient                            tolerated the procedure well. Scope In: Scope Out: Findings:      The examined esophagus was normal.      Five small angioectasias with no bleeding were found in the gastric       fundus, in the gastric body and in the gastric antrum. Coagulation for       hemostasis using argon plasma was successful. Estimated blood loss was       minimal.      A single angioectasia with no bleeding was found in the second portion       of the duodenum. Coagulation for hemostasis using argon plasma was       successful. Estimated blood loss was minimal.      There was no evidence of significant pathology in the proximal jejunum. Impression:               - Normal  esophagus.                           - Five non-bleeding angioectasias in the stomach.                            Treated with argon plasma coagulation (APC).                           - A single non-bleeding angioectasia in the                            duodenum. Treated with argon plasma coagulation                            (APC).                           - The examined portion of the jejunum was normal.                           - No specimens collected. Recommendation:           - Return patient to hospital ward for ongoing care.                           - Advance diet as tolerated.                           -  Repeat the small bowel enteroscopy PRN for                            retreatment.                           - Continue serial CBC checks while inpatient.                           - No plan to restart anticoagulation per primary                            inpatient service.                           - Repeat CBC 7-10 days after discharge. Can be                            coordinated through her Primary Care office.                           - I discussed results with the patient along with                            family members by phone per patient request.                           - Inpatient GI service will sign off at this time.                            Please do not hesitate to contact inpatient team                            with additional questions or concerns. Procedure Code(s):        --- Professional ---                           [phone], Small intestinal endoscopy, enteroscopy                            beyond second portion of duodenum, not including                            ileum; with control of bleeding (eg, injection,                            bipolar cautery, unipolar cautery, laser, heater                            probe, stapler, plasma coagulator) Diagnosis Code(s):        --- Professional ---                           G99.242, Angiodysplasia  of stomach and duodenum  without bleeding                           D62, Acute posthemorrhagic anemia CPT copyright 2019 American Medical Association. All rights reserved. The codes documented in this report are preliminary and upon coder review may  be revised to meet current compliance requirements. Gerrit Heck, MD 06/27/2022 11:20:36 AM Number of Addenda: 0

## 2022-06-27 NOTE — Progress Notes (Signed)
Mobility Specialist Progress Note    06/27/22 1520  Mobility  Activity Ambulated with assistance in hallway  Level of Assistance Minimal assist, patient does 75% or more  Assistive Device Front wheel walker  Distance Ambulated (ft) 115 ft  Activity Response Tolerated well  $Mobility charge 1 Mobility   Post-Mobility: 112 HR  Pt received in bed and agreeable. C/o being a little dizzy. Returned to chair with call bell in reach.    Hildred Alamin Mobility Specialist

## 2022-06-27 NOTE — Progress Notes (Signed)
OT Cancellation Note  Patient Details Name: Nicole Perez MRN: [id number] DOB: 1933/03/24   Cancelled Treatment:    Reason Eval/Treat Not Completed: Patient at procedure or test/ unavailable  Pt is currently OTF on Ot's arrival to room, OT will continue with efforts schedule permitting   Alayziah Tangeman OTR/L acute rehab services Office: [place] 06/27/2022, 9:38 AM

## 2022-06-27 NOTE — Progress Notes (Signed)
Daily Progress Note Intern Pager: [phone]  Patient name: ICYSS SKOG Medical record number: [id number] Date of birth: July 11, 1933 Age: 88 y.o. Gender: female  Primary Care Provider: McDiarmid, Blane Ohara, MD Consultants: GI Code Status: DNR  Pt Overview and Major Events to Date:  7/10 - admitted  Assessment and Plan:  TALANA SLATTEN is a 88 y.o. female who was found to be anemic to 9.1, in afib with RVR to low 100s, and SOB with cough and sputum production on meds for COPD exacerbation. 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  * Symptomatic anemia Hgb stable at 8.9>8.2 today (7/12). Pertinent history of lower GI bleed from jejunal Dieulafoy lesion in 2017, requiring clipping x2 at [facility]. EGD scheduled today to investigate cause of bleeding. - Trend Hgb - GI following for EGD today (7/12) - Hold anticoag given risk of bleeding - Continue protonix - Transfusion threshold 7 given no CAD  Atrial fibrillation (HCC) Stable and asymptomatic. Rate controlled with metoprolol. Family seems to believe this is been previously diagnosed, however cannot find records in chart and does not follow-up with cardiology. TSH 4.043, echo overall unremarkable. - Continuous cardiac Monitoring - Hold anticoag - Continue metoprolol 12.5 mg for rate control - Continue to monitor cardiac function - Consider outpatient cards referral  Emphysema of lung ([place]) Recently treated for COPD exacerbation on 7/6 with Augmentin and Prednisone x5 days. Currently doing well. - Supplemental O2 as needed  Right leg DVT ([place]), uncertain age Diagnosed in ED 05/08/2022, started on full dose Eliquis.  Followed by vascular, Dr. Donzetta Matters. LE ultrasound negative for DVT yesterday (7/12) - Hold anticoag, use SCDs for prophylaxis  Polyneuropathy associated with underlying disease ([place]) On Gabapentin '300mg'$  TID. - Continue home med  Chronic back  pain On Norco 10-'325mg'$  q6h PRN for pain. - Continue home meds as needed for chronic pain  Essential hypertension, benign At home on Losartan '50mg'$  QHS, HCTZ '25mg'$  daily.  - Hold home meds given normal blood pressure and new metoprolol for concurrent A-fib  Bladder neurogenous Stable. Self-catheterizes. - Continue in/out cath - Continue use of Depends   CKD Stage 3 Cr 1.18, GFR 45, at baseline. - Continue PO intake  Pulmonary nodules Incidental.  Noted on CT angiography, infectious vs malignancy. Patient is dyspneic, but it has improved with o/p COPD exacerbation treatment. - Recommend repeat chest CT in 3 months   FEN/GI: NPO for EGD PPx: SCDs given bleeding Dispo:Pending GI recommendations, likely home tomorrow if bleed found and controlled with continued Hgb improvement  Subjective:  Doing well this morning without symptoms. Eager to eat after EGD.  Objective: Temp:  [97.3 F (36.3 C)-99.1 F (37.3 C)] 98.2 F (36.8 C) (07/12 1133) Pulse Rate:  [87-109] 97 (07/12 1133) Resp:  [19-27] 19 (07/12 1133) BP: (83-136)/(53-94) 113/94 (07/12 1133) SpO2:  [96 %-99 %] 96 % (07/12 1133) Weight:  [65.3 kg] 65.3 kg (07/12 0944) Physical Exam: General: Lying in bed, conversant, in NAD Cardiovascular: Irregularly irregular rhythm, normal rate Respiratory: Breathing and speaking comfortably on RA Abdomen: Non-distended Extremities: Moves all extremities grossly equally  Laboratory: Most recent CBC Lab Results  Component Value Date   WBC 6.8 06/27/2022   HGB 8.2 (L) 06/27/2022   HCT 25.0 (L) 06/27/2022   MCV 87.1 06/27/2022   PLT 387 06/27/2022   Most recent BMP    Latest Ref Rng & Units 06/26/2022    2:06  AM  BMP  Glucose 70 - 99 mg/dL 103   BUN 8 - 23 mg/dL 26   Creatinine 0.44 - 1.00 mg/dL 1.18   Sodium 135 - 145 mmol/L 140   Potassium 3.5 - 5.1 mmol/L 4.7   Chloride 98 - 111 mmol/L 102   CO2 22 - 32 mmol/L 22   Calcium 8.9 - 10.3 mg/dL 8.9    Imaging/Diagnostic  Tests: ECHO IMPRESSIONS   1. Left ventricular ejection fraction, by estimation, is 60 to 65%. The  left ventricle has normal function. The left ventricle has no regional  wall motion abnormalities. Left ventricular diastolic function could not  be evaluated.   2. Right ventricular systolic function is normal. The right ventricular  size is normal.   3. The mitral valve is normal in structure. Mild mitral valve  regurgitation. No evidence of mitral stenosis.   4. The aortic valve is normal in structure. Aortic valve regurgitation is  not visualized. No aortic stenosis is present.   5. The inferior vena cava is normal in size with greater than 50%  respiratory variability, suggesting right atrial pressure of 3 mmHg.   Ethelene Hal, MD 06/27/2022, 12:09 PM PGY-1, [place] Intern pager: [phone], text pages welcome Secure chat group [place]

## 2022-06-28 ENCOUNTER — Other Ambulatory Visit (HOSPITAL_COMMUNITY): Payer: Self-pay

## 2022-06-28 ENCOUNTER — Encounter (HOSPITAL_COMMUNITY): Payer: Self-pay | Admitting: Gastroenterology

## 2022-06-28 DIAGNOSIS — D649 Anemia, unspecified: Secondary | ICD-10-CM | POA: Diagnosis not present

## 2022-06-28 DIAGNOSIS — I4891 Unspecified atrial fibrillation: Secondary | ICD-10-CM | POA: Diagnosis not present

## 2022-06-28 DIAGNOSIS — K5520 Angiodysplasia of colon without hemorrhage: Secondary | ICD-10-CM

## 2022-06-28 DIAGNOSIS — G63 Polyneuropathy in diseases classified elsewhere: Secondary | ICD-10-CM

## 2022-06-28 DIAGNOSIS — K922 Gastrointestinal hemorrhage, unspecified: Secondary | ICD-10-CM | POA: Diagnosis not present

## 2022-06-28 LAB — CBC
HCT: 25.3 % — ABNORMAL LOW (ref 36.0–46.0)
Hemoglobin: 8.2 g/dL — ABNORMAL LOW (ref 12.0–15.0)
MCH: 28.7 pg (ref 26.0–34.0)
MCHC: 32.4 g/dL (ref 30.0–36.0)
MCV: 88.5 fL (ref 80.0–100.0)
Platelets: 396 10*3/uL (ref 150–400)
RBC: 2.86 MIL/uL — ABNORMAL LOW (ref 3.87–5.11)
RDW: 13.3 % (ref 11.5–15.5)
WBC: 7.3 10*3/uL (ref 4.0–10.5)
nRBC: 0 % (ref 0.0–0.2)

## 2022-06-28 MED ORDER — METOPROLOL SUCCINATE ER 25 MG PO TB24
25.0000 mg | ORAL_TABLET | Freq: Every day | ORAL | 1 refills | Status: DC
  Filled 2022-06-28: qty 30, 30d supply, fill #0

## 2022-06-28 MED ORDER — ENSURE ENLIVE PO LIQD: 237.0000 mL | Freq: Two times a day (BID) | ORAL | 12 refills | Status: DC

## 2022-06-28 MED ORDER — PANTOPRAZOLE SODIUM 40 MG PO TBEC
40.0000 mg | DELAYED_RELEASE_TABLET | Freq: Every day | ORAL | 0 refills | Status: DC
  Filled 2022-06-28: qty 7, 7d supply, fill #0

## 2022-06-28 NOTE — Discharge Summary (Addendum)
[facility] Discharge Summary  Patient name: Nicole Perez Medical record number: [id number] Date of birth: 07-Mar-1933 Age: 88 y.o. Gender: female Date of Admission: 06/24/2022  Date of Discharge: 06/28/2022 Admitting Physician: Lind Covert, MD  Primary Care Provider: McDiarmid, Blane Ohara, MD Consultants: GI  Indication for Hospitalization: Symptomatic anemia 2/2 GI bleed  Brief Hospital Course:  Nicole Perez is a 88 y.o. 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 Hospital course outlined by problem below:  Symptomatic anemia likely 2/2 gastric and duodenal angioectasia Patient presented with fatigue, SOB for a week. No blood loss known to patient.  On admission, Hgb 9.1 and FOBT positive. Protonix started, GI consulted. EGD performed which showed non-bleeding angioectasias that were treated with argon plasma coagulation. Hgb remained stable just over 8 over admission. At discharge, patient remained improved, hemoglobin 8.2  Paroxysmal atrial fibrillation Patient presented with fatigue, SOB and was found to be in afib with RVR. Suspected paroxysmal given history of remote diagnosis but not on rate or rhythm control. Eliquis for prior DVT recently started in 04/2022 was stopped given acute bleeding. DVT doppler rechecked and unremarkable, did not demonstrate a DVT in either leg. She was started on metoprolol 12.5 mg BID with rate control to 70s-80s at discharge.  At the time of discharge, she was not on any anticoagulation given GI bleed.  Both family and patient amenable to no further Eliquis use during her lifetime.  COPD exacerbation Patient had SOB, cough, and sputum production on admission. She had been evaluated by her PCP several days earlier and was  already started on five days of azithro and prednisone for exacerbation. She stopped taking prednisone at home (due to side effects) but finished her last dose of azithro in the hospital.  During hospitalization, she remained stable on room air.  By discharge, had no complaints of dyspnea or cough.  Incidental findings On 7/9 we obtained CTA PE study to rule out pulmonary embolus.  While the study was negative for a PE, it did find the following incidentals: - Scattered pulmonary nodules bilaterally, unsure if infectious versus inflammatory versus neoplasm.  Radiology recommends one of the following in 3 months for both low-risk and high-risk individuals: (a) repeat chest CT, (b) follow-up PET-CT, or (c) tissue sampling. -Aneurysm of descending thoracic aorta, measures 4.3 cm.  Radiology recommends semiannual imaging by CTA or MRA.  Alternatively, may refer to CT surgery.  Chronic back pain, polyneuropathy, HTN, bladder neuropathy all chronic and stable over admission.  Items for follow-up: Discuss long term use of anticoagulants for afib in context of [place]. Both family and patient amenable to no further Eliquis use during her lifetime. Repeat chest CT in 3 months to monitor pulmonary nodules seen incidentally on CTA PE Semi-annual CTA or MRA follow-up and referral to cardiothoracic surgery for aortic atherosclerosis with aneurysmal dilatation of descending thoracic aorta.  See above for more information. Plan to repeat CBC checks 7-10 days after hospital discharge to ensure returning to baseline. Tolerated metoprolol tartrate 12.'5mg'$  BID while inpatient, discharged with metoprolol succinate 25 mg daily.  On admission, home Losartan and HCTZ were held due to low blood pressures.  Even after anemia stabilization, blood pressures remained low-normal.  She was not started back on these at discharge.  PCP to  evaluate need for restart, if any.  Discharge Diagnoses/Problem List:  Principal Problem for  Admission: symptomatic anemia 2/2 GI bleed Other Problems addressed during stay:  Present on Admission:  Symptomatic anemia  Emphysema of lung ([place])  Right leg DVT ([place]), uncertain age  Bladder neurogenous  Essential hypertension, benign  Chronic back pain  Polyneuropathy associated with underlying disease ([place])  Acute exacerbation of chronic obstructive pulmonary disease (COPD) ([facility])  Chronic, continuous use of opioids  Disposition: Home  Discharge Condition: Stable  Discharge Exam: Blood pressure (!) 105/91, pulse 93, temperature 98.1 F (36.7 C), temperature source Oral, resp. rate 20, height '4\' 11"'$  (1.499 m), weight 65.3 kg, last menstrual period 12/17/1962, SpO2 96 %.  GEN: Lying in bed, conversant, in NAD CAR: Regular rate, irregular rhythm RES: Breathing and speaking comfortably on RA ABD: Non-distended PSY: Alert and oriented EXT: Moves all extremities equally  Significant Procedures: EGD with results as below  Significant Labs and Imaging:  Recent Labs  Lab 06/27/22 0523 06/27/22 1821 06/28/22 0158  WBC 6.8 6.4 7.3  HGB 8.2* 8.6* 8.2*  HCT 25.0* 28.3* 25.3*  PLT 387 397 396      Latest Ref Rng & Units 06/26/2022    2:06 AM 06/24/2022    6:20 PM 06/21/2022   12:04 PM  BMP  Glucose 70 - 99 mg/dL 103  125  115   BUN 8 - 23 mg/dL 26  30  39   Creatinine 0.44 - 1.00 mg/dL 1.18  0.96  1.43   BUN/Creat Ratio 12 - 28   27   Sodium 135 - 145 mmol/L 140  133  141   Potassium 3.5 - 5.1 mmol/L 4.7  4.1  3.9   Chloride 98 - 111 mmol/L 102  97  100   CO2 22 - 32 mmol/L '22  27  23   '$ Calcium 8.9 - 10.3 mg/dL 8.9  9.5  9.0     CHEST - 2 VIEW 06/22/2022 IMPRESSION: 1.  No active cardiopulmonary disease. 2.  Stable aneurysmal dilatation of the descending thoracic aorta.  CT ANGIOGRAPHY CHEST WITH CONTRAST 06/24/2022 IMPRESSION: 1. No large central pulmonary embolus. There is limited evaluation of the distal pulmonary arteries due to mixing artifact. 2. Scattered  pulmonary nodules bilaterally. While findings may be infectious or inflammatory, the possibility of underlying neoplasm can not be excluded. Consider one of the following in 3 months for both low-risk and high-risk individuals: (a) repeat chest CT, (b) follow-up PET-CT, or (c) tissue sampling. This recommendation follows the consensus statement: Guidelines for Management of Incidental Pulmonary Nodules Detected on CT Images: From the Fleischner Society 2017; Radiology 2017; 284:228-243.  3. Aortic atherosclerosis with aneurysmal dilatation of the descending thoracic aorta measuring 4.3 cm. Recommend semi-annual imaging followup by CTA or MRA and referral to cardiothoracic surgery if not already obtained. This recommendation follows 2010 ACCF/AHA/AATS/ACR/ASA/SCA/SCAI/SIR/STS/SVM Guidelines for the Diagnosis and Management of Patients With Thoracic Aortic Disease. Circulation. 2010; 121: P809-X83. Aortic aneurysm NOS (ICD10-I71.9) 4. Emphysema. 5. Coronary artery calcifications.  EGD Impression: - Normal esophagus. - Five non-bleeding angioectasias in the stomach. Treated with argon plasma coagulation (APC). - A single non-bleeding angioectasia in the duodenum. Treated with argon plasma coagulation (APC). - The examined portion of the jejunum was normal. - No specimens collected.  Results/Tests Pending at Time of Discharge: None  Discharge Medications:  Allergies as of 06/28/2022       Reactions   Ciprofloxacin Other (See Comments)   Fluoroquinolones associated with increase risk  of aortic aneurysm reupture   Nitrofurantoin Other (See Comments)   Malaise and profound fatigue   Keflex [cephalexin] Nausea Only        Medication List     STOP taking these medications    amoxicillin-clavulanate 875-125 MG tablet Commonly known as: AUGMENTIN   hydrochlorothiazide 25 MG tablet Commonly known as: HYDRODIURIL   losartan 25 MG tablet Commonly known as: COZAAR   predniSONE 20 MG  tablet Commonly known as: DELTASONE       TAKE these medications    bisacodyl 5 MG EC tablet Generic drug: bisacodyl Take 2 tablets (10 mg total) by mouth daily. What changed:  when to take this reasons to take this   feeding supplement Liqd Take 237 mLs by mouth 2 (two) times daily between meals.   gabapentin 300 MG capsule Commonly known as: NEURONTIN Take 1 capsule (300 mg total) by mouth 3 (three) times daily. What changed:  when to take this reasons to take this   HYDROcodone-acetaminophen 10-325 MG tablet Commonly known as: NORCO Take 1 tablet by mouth every 6 (six) hours as needed for moderate pain.   metoprolol succinate 25 MG 24 hr tablet Commonly known as: Toprol XL Take 1 tablet (25 mg total) by mouth daily.   pantoprazole 40 MG tablet Commonly known as: PROTONIX Take 1 tablet (40 mg total) by mouth daily at 6 (six) AM. Start taking on: June 29, 2022   solifenacin 5 MG tablet Commonly known as: VESICARE Take 5 mg by mouth daily as needed (overactive bladder).       Discharge Instructions: Please refer to Patient Instructions section of EMR for full details.  Patient was counseled important signs and symptoms that should prompt return to medical care, changes in medications, dietary instructions, activity restrictions, and follow up appointments.   Follow-Up Appointments:  Follow-up Information     Lattie Haw, MD. Go on 07/03/2022.   Specialty: Family Medicine Why: Please arrive 15 minutes early for your appointment at 11:10am for your hospital follow-up visit. Contact information: [address] [place] [address] [phone]         Care, [facility] Follow up.   Specialty: Home Health Services Why: Your home health has been set up with [facility]. The office will call you with start of care ionformation. If you have questions please call the number listed above. Contact information: [place]  [address] [phone]                Ethelene Hal, MD 06/28/2022, 12:58 PM PGY-1, [facility]   Upper Level Addendum: I have seen and evaluated this patient along with Dr. Marcha Dutton and reviewed the above note, making necessary revisions as appropriate. I agree with the medical decision making and physical exam as noted above. Ezequiel Essex, MD PGY-3 Head of the [facility]

## 2022-06-28 NOTE — Discharge Instructions (Addendum)
Dear Nicole Perez,   Thank you for letting us care for you. You were admitted for fatigue, shortness of breath due to anemia (low blood counts), atrial fibrillation (abnormal heart rhythm) and COPD exacerbation. We treated you with a blood transfusion, antibiotics and diuretics for your COPD, and our gastroenterologists performed an EGD (esophagogastroduodenectomy) and treated the source of bleeding in your stomach.   POST-HOSPITAL & CARE INSTRUCTIONS STOP taking your Eliquis because this can cause further bleeding episodes in the future which can be dangerous for your health. START taking Metoprolol which helps control your heart rate while in an irregular rhythm such as atrial fibrillation. START taking pantoprazole once per day for 7 days. Continue taking your other medications as prescribed Please follow-up with Dr. Lattie Haw in our clinic on Tuesday, July 18th at 11:10am. Please arrive 15 minutes early.  DOCTOR'S APPOINTMENTS & FOLLOW UP Future Appointments  Date Time Provider [place]  08/01/2022  3:00 PM [facility] 4 - SS [facility]  08/01/2022  3:30 PM [facility] 4 - [facility]  08/01/2022  4:00 PM Waynetta Sandy, MD [facility]     Thank you for choosing [facility]! Take care and be well!  [facility]  [place],  [address] [phone]

## 2022-06-28 NOTE — TOC Transition Note (Signed)
Transition of Care [facility]) - CM/SW Discharge Note   Patient Details  Name: Nicole Perez MRN: [id number] Date of Birth: [birth year]  Transition of Care [facility] CM/SW Contact:  Angelita Ingles, RN Phone [phone]  06/28/2022, 12:33 PM   Clinical Narrative:    [facility] consulted Home health referral. CM at bedside with patient and daughters to offer choice. Daughter and patient wish to have [place]. Referral accepted by [facility] with [name]. AVS updated. No other needs noted at this time. TOC will sign off.          Patient Goals and CMS Choice        Discharge Placement                       Discharge Plan and Services                                     Social Determinants of Health (SDOH) Interventions     Readmission Risk Interventions     No data to display

## 2022-07-03 ENCOUNTER — Ambulatory Visit: Payer: Medicare Other | Admitting: Family Medicine

## 2022-07-05 ENCOUNTER — Encounter: Payer: Self-pay | Admitting: Family Medicine

## 2022-07-05 ENCOUNTER — Ambulatory Visit: Payer: Medicare Other | Admitting: Family Medicine

## 2022-07-05 DIAGNOSIS — M170 Bilateral primary osteoarthritis of knee: Secondary | ICD-10-CM | POA: Diagnosis not present

## 2022-07-05 DIAGNOSIS — M5441 Lumbago with sciatica, right side: Secondary | ICD-10-CM

## 2022-07-05 DIAGNOSIS — M47812 Spondylosis without myelopathy or radiculopathy, cervical region: Secondary | ICD-10-CM | POA: Diagnosis not present

## 2022-07-05 DIAGNOSIS — D62 Acute posthemorrhagic anemia: Secondary | ICD-10-CM

## 2022-07-05 DIAGNOSIS — G959 Disease of spinal cord, unspecified: Secondary | ICD-10-CM | POA: Diagnosis not present

## 2022-07-05 DIAGNOSIS — I10 Essential (primary) hypertension: Secondary | ICD-10-CM

## 2022-07-05 DIAGNOSIS — M4804 Spinal stenosis, thoracic region: Secondary | ICD-10-CM

## 2022-07-05 DIAGNOSIS — M159 Polyosteoarthritis, unspecified: Secondary | ICD-10-CM | POA: Diagnosis not present

## 2022-07-05 DIAGNOSIS — F1190 Opioid use, unspecified, uncomplicated: Secondary | ICD-10-CM

## 2022-07-05 DIAGNOSIS — I480 Paroxysmal atrial fibrillation: Secondary | ICD-10-CM

## 2022-07-05 DIAGNOSIS — M48062 Spinal stenosis, lumbar region with neurogenic claudication: Secondary | ICD-10-CM | POA: Diagnosis not present

## 2022-07-05 DIAGNOSIS — M461 Sacroiliitis, not elsewhere classified: Secondary | ICD-10-CM

## 2022-07-05 DIAGNOSIS — M4715 Other spondylosis with myelopathy, thoracolumbar region: Secondary | ICD-10-CM | POA: Diagnosis not present

## 2022-07-05 DIAGNOSIS — G894 Chronic pain syndrome: Secondary | ICD-10-CM | POA: Diagnosis not present

## 2022-07-05 DIAGNOSIS — I82461 Acute embolism and thrombosis of right calf muscular vein: Secondary | ICD-10-CM

## 2022-07-05 DIAGNOSIS — M160 Bilateral primary osteoarthritis of hip: Secondary | ICD-10-CM | POA: Diagnosis not present

## 2022-07-05 DIAGNOSIS — G8929 Other chronic pain: Secondary | ICD-10-CM

## 2022-07-05 DIAGNOSIS — K2971 Gastritis, unspecified, with bleeding: Secondary | ICD-10-CM

## 2022-07-05 MED ORDER — PANTOPRAZOLE SODIUM 40 MG PO TBEC: 40.0000 mg | DELAYED_RELEASE_TABLET | Freq: Every day | ORAL | 3 refills | Status: DC

## 2022-07-05 MED ORDER — METOPROLOL SUCCINATE ER 25 MG PO TB24: 25.0000 mg | ORAL_TABLET | Freq: Every day | ORAL | 3 refills | Status: DC

## 2022-07-05 MED ORDER — HYDROCODONE-ACETAMINOPHEN 10-325 MG PO TABS: 1.0000 | ORAL_TABLET | Freq: Four times a day (QID) | ORAL | 0 refills | Status: DC | PRN

## 2022-07-05 NOTE — Patient Instructions (Addendum)
I will call with blood test results. Stay on the same dose of the gabapentin. Cut back on the salt/sodium to help swelling in your feet.   No more blood thinner.  I think the risk outweighs the benefit.   I refilled the metoprolol and protonix for 90 days.   Recheck with Dr. McDiarmid in 1 month.

## 2022-07-05 NOTE — Assessment & Plan Note (Addendum)
Stop HCTZ and Losartan given hypotension in hospital. Continue Metoprolol Succinate '25mg'$  qD.  Continue to monitor BP

## 2022-07-05 NOTE — Progress Notes (Signed)
  Date of Visit: 07/05/2022   HPI:  Nicole Perez is a 88 y.o. female here with her daughter for follow-up of hospital admission 06/24/22-06/28/22 for symptomatic anemia 2/2 angioectasia, COPD exacerbation, and paroxysmal atrial fibrillation.   Anemia: Patient denies fatigue and SOB since hospital discharge. She requests Protonix refill.  A fib: Patient is taking the metoprolol succinate '25mg'$  daily. Patient and daughter agree to stop further anticoagulation due to GI bleed.  COPD exacerbation: Patient denies cough. Daughter and patient report patient is doing better since hospital admission. She reports x1 SOB this morning getting ready for the visit. She finished her antibiotics.  Leg swelling: Patient reports leg swelling was absent during hospitalization. Since discharge she has swelling of the right foot and leg. She is unable to put on her shoes due to swelling. Daughter reports it could be correlated with taking Gabapentin.  Pain: Patient has chronic arthritis and takes on average 2 hydrocodone tablets for pain daily. Requesting refills.  PHYSICAL EXAM: BP (!) 141/83   Pulse 87   Ht '4\' 11"'$  (1.499 m)   Wt 152 lb 12.8 oz (69.3 kg)   LMP 12/17/1962   SpO2 96%   BMI 30.86 kg/m  Gen: Well appearing female in no distress. CV: Pitting edema of right leg +2. Palms are red. No conjunctival pallor. Pulm: No increased work of breathing. Neuro: Alert.  ASSESSMENT/PLAN:  Paroxysmal atrial fibrillation [facility]) Discussed with patient and family given increased bleeding risk and angioectasias on endoscopy, stop Eliquis. Continue Metoprolol Succinate '25mg'$  qD for rate control. Refilled Metoprolol.  Gastrointestinal hemorrhage Secondary to angioectasias. Continue Pantoprazole '40mg'$  qD. Refilled medication. CBC today.  Essential hypertension, benign Stop HCTZ and Losartan given hypotension in hospital. Continue Metoprolol Succinate '25mg'$  qD.   Osteoarthritis, multiple sites Stable. Refilled  hydrocodone.  R leg edema Likely venous insufficiency. Continue Gabapentin.  Leonette Nutting, [place]

## 2022-07-05 NOTE — Assessment & Plan Note (Addendum)
Discussed with patient and family given increased bleeding risk and angioectasias on endoscopy, stop Eliquis. Continue Metoprolol Succinate '25mg'$  qD for rate control. Refilled Metoprolol. Continued low Hgb reinforces my belief that she should stay off the [place]

## 2022-07-05 NOTE — Assessment & Plan Note (Deleted)
Refilled Hydrocodone.

## 2022-07-05 NOTE — Assessment & Plan Note (Addendum)
Secondary to angioectasias. Continue Pantoprazole '40mg'$  qD. Refilled medication. CBC today. No evidence of ongoing hemorrhage.

## 2022-07-05 NOTE — Assessment & Plan Note (Signed)
Stable. Refilled hydrocodone.

## 2022-07-06 ENCOUNTER — Encounter: Payer: Self-pay | Admitting: Family Medicine

## 2022-07-06 ENCOUNTER — Telehealth: Payer: Self-pay

## 2022-07-06 LAB — CBC
Hematocrit: 26 % — ABNORMAL LOW (ref 34.0–46.6)
Hemoglobin: 8.5 g/dL — ABNORMAL LOW (ref 11.1–15.9)
MCH: 28.1 pg (ref 26.6–33.0)
MCHC: 32.7 g/dL (ref 31.5–35.7)
MCV: 86 fL (ref 79–97)
Platelets: 286 10*3/uL (ref 150–450)
RBC: 3.02 x10E6/uL — ABNORMAL LOW (ref 3.77–5.28)
RDW: 13.7 % (ref 11.7–15.4)
WBC: 3.7 10*3/uL (ref 3.4–10.8)

## 2022-07-06 MED ORDER — FERROUS SULFATE 324 (65 FE) MG PO TBEC: 1.0000 | DELAYED_RELEASE_TABLET | Freq: Every day | ORAL | 1 refills | Status: DC

## 2022-07-06 NOTE — Telephone Encounter (Signed)
Patient's daughter, Shirlean Mylar, calls nurse line regarding questions with BP medications. Daughter reports that when she picked up medications yesterday, they also gave her HCTZ.   Advised that per note from Dr. Andria Frames, patient was supposed to stop HCTZ and losartan and only take metoprolol.  Daughter voices understanding.   Answered all questions.   Talbot Grumbling, RN

## 2022-07-06 NOTE — Assessment & Plan Note (Signed)
I called patient and left voicemail.  I repeated that message on MyChart.  Hgb still low.  Reviewed in hospital irons studies.  Percent sat was a bit low.  I added iron to her regimen.

## 2022-07-06 NOTE — Progress Notes (Signed)
Hospital follow up:  Multiple issues: Anemia.  No further bleeding noted.  Feels better.  Needs refill on protonix.  I support ongoing therapy since gastric AVMs will continue to be there Afib.  No complaints of tachycardia.  Off DOAC due to GI bleed.  I advised to remain off DOAC.  There was some concern about a DVT but I cannot find any confirmation.  I doubt acute DVT.   Some DOE.  Likely multifactorial.  COPD seems controled, no wheeze. Anemia can contribute.  At age 88, she will have limited reserve. Leg edema: Mild on exam.  HCTZ DCed recently.  Again, likely multifactorial with anemia and venous insufficiency being likely. Hypertension.  BPs low in the hospital.  Now only on metoprolol for the dual purpose of rate control and antihypertensive Rx.  BP is acceptable today. Goals of care: Patient and daughter realize that she is aging and has a heavy burden of chronic disease.  They may start declining things like repeat imaging for her aortic aneurism.  No decisions today.  I reminded them that they have the power to say "No, thanks" to our active treatments.

## 2022-07-09 ENCOUNTER — Telehealth: Payer: Self-pay

## 2022-07-09 NOTE — Telephone Encounter (Signed)
Nicole Perez calls nurse line reporting SOB.   Nicole Perez reports she believes the SOB is coming from the iron pills.   Daughter reports first dose was Friday and patient has experienced SOB everyday since. SOB only lasts a few minutes and passes.   Nicole Perez reports she read where iron supplements can cause SOB. Nicole Perez Patient reports she thinks her mother is taking them on an empty stomach.   Will forward to MD to advise.   Red flags discussed with Nicole Perez in the meantime.

## 2022-07-10 NOTE — Telephone Encounter (Signed)
Spoke with patients daughter. Made appointment for 7/27 at 1:30p Salvatore Marvel, [place]

## 2022-07-10 NOTE — Telephone Encounter (Signed)
Patient's daughter returns call to nurse line regarding message from yesterday. Reports that she is still having intermittent SHOB. Denies chest pain and patient is not currently having difficulty breathing.   Daughter is requesting to speak with Dr. McDiarmid regarding concern. Advised of ED precautions. Daughter reports that she does not believe they need to go to the ED at this time.   Please return call to [phone].  Talbot Grumbling, RN

## 2022-07-10 NOTE — Telephone Encounter (Signed)
I spoke with Nicole Perez Patient about her mother. Starting 2 days ago Nicole Perez began complaining of shortness of breath, worse with lying down. The feeling is transient, and is releived with sitting up.  She is also experiencing DOE.  She is currently not shortness of breath.  I asked to see Nicole Perez in my clinic on 07/12/22.    I will ask nursing to contact Nicole Perez Patient to schedule this appointment.

## 2022-07-11 ENCOUNTER — Emergency Department (HOSPITAL_COMMUNITY): Payer: Medicare Other

## 2022-07-11 ENCOUNTER — Encounter (HOSPITAL_COMMUNITY): Payer: Self-pay | Admitting: *Deleted

## 2022-07-11 ENCOUNTER — Observation Stay (HOSPITAL_COMMUNITY)
Admission: EM | Admit: 2022-07-11 | Discharge: 2022-07-13 | Disposition: A | Discharge status: Home Health | Payer: Medicare Other | Attending: Family Medicine | Admitting: Family Medicine

## 2022-07-11 ENCOUNTER — Other Ambulatory Visit: Payer: Self-pay

## 2022-07-11 DIAGNOSIS — Z743 Need for continuous supervision: Secondary | ICD-10-CM | POA: Diagnosis not present

## 2022-07-11 DIAGNOSIS — Z86718 Personal history of other venous thrombosis and embolism: Secondary | ICD-10-CM | POA: Diagnosis not present

## 2022-07-11 DIAGNOSIS — R0902 Hypoxemia: Secondary | ICD-10-CM | POA: Diagnosis present

## 2022-07-11 DIAGNOSIS — J449 Chronic obstructive pulmonary disease, unspecified: Secondary | ICD-10-CM

## 2022-07-11 DIAGNOSIS — J439 Emphysema, unspecified: Secondary | ICD-10-CM | POA: Insufficient documentation

## 2022-07-11 DIAGNOSIS — I5020 Unspecified systolic (congestive) heart failure: Secondary | ICD-10-CM | POA: Diagnosis not present

## 2022-07-11 DIAGNOSIS — R0602 Shortness of breath: Secondary | ICD-10-CM | POA: Diagnosis not present

## 2022-07-11 DIAGNOSIS — Z79899 Other long term (current) drug therapy: Secondary | ICD-10-CM | POA: Diagnosis not present

## 2022-07-11 DIAGNOSIS — J441 Chronic obstructive pulmonary disease with (acute) exacerbation: Secondary | ICD-10-CM | POA: Insufficient documentation

## 2022-07-11 DIAGNOSIS — Z20822 Contact with and (suspected) exposure to covid-19: Secondary | ICD-10-CM | POA: Insufficient documentation

## 2022-07-11 DIAGNOSIS — R609 Edema, unspecified: Secondary | ICD-10-CM | POA: Diagnosis not present

## 2022-07-11 DIAGNOSIS — R601 Generalized edema: Secondary | ICD-10-CM | POA: Diagnosis not present

## 2022-07-11 DIAGNOSIS — R0689 Other abnormalities of breathing: Secondary | ICD-10-CM | POA: Diagnosis not present

## 2022-07-11 DIAGNOSIS — I480 Paroxysmal atrial fibrillation: Secondary | ICD-10-CM | POA: Diagnosis present

## 2022-07-11 DIAGNOSIS — Z87891 Personal history of nicotine dependence: Secondary | ICD-10-CM | POA: Insufficient documentation

## 2022-07-11 DIAGNOSIS — I499 Cardiac arrhythmia, unspecified: Secondary | ICD-10-CM | POA: Diagnosis not present

## 2022-07-11 DIAGNOSIS — I10 Essential (primary) hypertension: Secondary | ICD-10-CM | POA: Diagnosis present

## 2022-07-11 DIAGNOSIS — I110 Hypertensive heart disease with heart failure: Secondary | ICD-10-CM | POA: Diagnosis not present

## 2022-07-11 DIAGNOSIS — R6889 Other general symptoms and signs: Secondary | ICD-10-CM | POA: Diagnosis not present

## 2022-07-11 DIAGNOSIS — I509 Heart failure, unspecified: Secondary | ICD-10-CM

## 2022-07-11 DIAGNOSIS — I4811 Longstanding persistent atrial fibrillation: Secondary | ICD-10-CM | POA: Insufficient documentation

## 2022-07-11 DIAGNOSIS — G894 Chronic pain syndrome: Secondary | ICD-10-CM | POA: Diagnosis present

## 2022-07-11 LAB — CBC WITH DIFFERENTIAL/PLATELET
Abs Immature Granulocytes: 0.01 10*3/uL (ref 0.00–0.07)
Basophils Absolute: 0 10*3/uL (ref 0.0–0.1)
Basophils Relative: 1 %
Eosinophils Absolute: 0.2 10*3/uL (ref 0.0–0.5)
Eosinophils Relative: 4 %
HCT: 29.3 % — ABNORMAL LOW (ref 36.0–46.0)
Hemoglobin: 9.2 g/dL — ABNORMAL LOW (ref 12.0–15.0)
Immature Granulocytes: 0 %
Lymphocytes Relative: 30 %
Lymphs Abs: 1.3 10*3/uL (ref 0.7–4.0)
MCH: 28.2 pg (ref 26.0–34.0)
MCHC: 31.4 g/dL (ref 30.0–36.0)
MCV: 89.9 fL (ref 80.0–100.0)
Monocytes Absolute: 0.3 10*3/uL (ref 0.1–1.0)
Monocytes Relative: 8 %
Neutro Abs: 2.6 10*3/uL (ref 1.7–7.7)
Neutrophils Relative %: 57 %
Platelets: 236 10*3/uL (ref 150–400)
RBC: 3.26 MIL/uL — ABNORMAL LOW (ref 3.87–5.11)
RDW: 15.3 % (ref 11.5–15.5)
WBC: 4.5 10*3/uL (ref 4.0–10.5)
nRBC: 0 % (ref 0.0–0.2)

## 2022-07-11 LAB — BASIC METABOLIC PANEL
Anion gap: 9 (ref 5–15)
BUN: 21 mg/dL (ref 8–23)
CO2: 22 mmol/L (ref 22–32)
Calcium: 9.1 mg/dL (ref 8.9–10.3)
Chloride: 108 mmol/L (ref 98–111)
Creatinine, Ser: 1.11 mg/dL — ABNORMAL HIGH (ref 0.44–1.00)
GFR, Estimated: 48 mL/min — ABNORMAL LOW (ref >60)
Glucose, Bld: 111 mg/dL — ABNORMAL HIGH (ref 70–99)
Potassium: 4 mmol/L (ref 3.5–5.1)
Sodium: 139 mmol/L (ref 135–145)

## 2022-07-11 LAB — SARS CORONAVIRUS 2 BY RT PCR: SARS Coronavirus 2 by RT PCR: NEGATIVE

## 2022-07-11 LAB — BRAIN NATRIURETIC PEPTIDE: B Natriuretic Peptide: 360.5 pg/mL — ABNORMAL HIGH (ref 0.0–100.0)

## 2022-07-11 LAB — TROPONIN I (HIGH SENSITIVITY): Troponin I (High Sensitivity): 20 ng/L — ABNORMAL HIGH (ref <18)

## 2022-07-11 MED ORDER — IPRATROPIUM-ALBUTEROL 0.5-2.5 (3) MG/3ML IN SOLN
3.0000 mL | Freq: Once | RESPIRATORY_TRACT | Status: AC
  Administered 2022-07-11: 3 mL via RESPIRATORY_TRACT
  Filled 2022-07-11: qty 3

## 2022-07-11 MED ORDER — METHYLPREDNISOLONE SODIUM SUCC 125 MG IJ SOLR
125.0000 mg | Freq: Once | INTRAMUSCULAR | Status: AC
  Administered 2022-07-12: 125 mg via INTRAVENOUS
  Filled 2022-07-11: qty 2

## 2022-07-11 NOTE — ED Provider Notes (Signed)
Patient signed out pending CT scan PE study.  In brief presented with shortness of breath.  Somewhat improved after DuoNeb.  CT scan reviewed.  Negative for blood clots.  Overall improved appearance of opacities from prior CT.  She does have small bilateral pleural effusions.  Echocardiogram reviewed from prior admission without evidence of systolic failure.  No mention of diastolic failure.  On my evaluation she states she feels somewhat better.  O2 sats low 90s on room air and while resting.  She does have some peripheral lower extremity edema which she states is new and is bothersome to her.  She was previously on HCTZ but was taken off of her blood pressure medications secondary to soft pressures while in the hospital previously.  Upon ambulation, patient got winded and dropped her O2 sats in the high 70s.  Likely multifactorial in nature although some element of pulmonary edema in addition to COPD may be culprit.  She was given 20 mg of IV Lasix.  She is in rate controlled atrial fibrillation.  Troponins flat at 20 and 19.  Feel she wants admission given ongoing shortness of breath and hypoxia with ambulation.  We will consult with family medicine.   Merryl Hacker, MD 07/12/22 [phone]

## 2022-07-11 NOTE — ED Triage Notes (Signed)
The pt arrived by gems from home with difficulty breathing since yesterday worse today  no 02 at home   non-smoker  nonrebreather mask removed when the pt arrived  sats now and then ok  c/o swelling in her lt elg  she has had for awhile

## 2022-07-11 NOTE — ED Notes (Signed)
The pt is alert  coughing intermittently

## 2022-07-12 ENCOUNTER — Emergency Department (HOSPITAL_COMMUNITY): Payer: Medicare Other

## 2022-07-12 ENCOUNTER — Ambulatory Visit: Payer: Medicare Other | Admitting: Family Medicine

## 2022-07-12 DIAGNOSIS — I5033 Acute on chronic diastolic (congestive) heart failure: Secondary | ICD-10-CM | POA: Diagnosis not present

## 2022-07-12 DIAGNOSIS — J441 Chronic obstructive pulmonary disease with (acute) exacerbation: Secondary | ICD-10-CM | POA: Diagnosis not present

## 2022-07-12 DIAGNOSIS — R0902 Hypoxemia: Secondary | ICD-10-CM | POA: Diagnosis present

## 2022-07-12 DIAGNOSIS — J439 Emphysema, unspecified: Secondary | ICD-10-CM

## 2022-07-12 DIAGNOSIS — I509 Heart failure, unspecified: Secondary | ICD-10-CM

## 2022-07-12 DIAGNOSIS — R0602 Shortness of breath: Secondary | ICD-10-CM | POA: Diagnosis not present

## 2022-07-12 HISTORY — DX: Hypoxemia: R09.02

## 2022-07-12 HISTORY — DX: Heart failure, unspecified: I50.9

## 2022-07-12 LAB — CBC
HCT: 29.3 % — ABNORMAL LOW (ref 36.0–46.0)
Hemoglobin: 9.5 g/dL — ABNORMAL LOW (ref 12.0–15.0)
MCH: 28.7 pg (ref 26.0–34.0)
MCHC: 32.4 g/dL (ref 30.0–36.0)
MCV: 88.5 fL (ref 80.0–100.0)
Platelets: 235 10*3/uL (ref 150–400)
RBC: 3.31 MIL/uL — ABNORMAL LOW (ref 3.87–5.11)
RDW: 15.4 % (ref 11.5–15.5)
WBC: 5.3 10*3/uL (ref 4.0–10.5)
nRBC: 0 % (ref 0.0–0.2)

## 2022-07-12 LAB — BASIC METABOLIC PANEL
Anion gap: 11 (ref 5–15)
BUN: 22 mg/dL (ref 8–23)
CO2: 21 mmol/L — ABNORMAL LOW (ref 22–32)
Calcium: 9.3 mg/dL (ref 8.9–10.3)
Chloride: 106 mmol/L (ref 98–111)
Creatinine, Ser: 0.99 mg/dL (ref 0.44–1.00)
GFR, Estimated: 55 mL/min — ABNORMAL LOW (ref >60)
Glucose, Bld: 154 mg/dL — ABNORMAL HIGH (ref 70–99)
Potassium: 3.9 mmol/L (ref 3.5–5.1)
Sodium: 138 mmol/L (ref 135–145)

## 2022-07-12 LAB — TROPONIN I (HIGH SENSITIVITY): Troponin I (High Sensitivity): 19 ng/L — ABNORMAL HIGH (ref <18)

## 2022-07-12 MED ORDER — GABAPENTIN 300 MG PO CAPS: 300.0000 mg | ORAL_CAPSULE | Freq: Every day | ORAL | Status: DC | PRN

## 2022-07-12 MED ORDER — PREDNISONE 20 MG PO TABS
40.0000 mg | ORAL_TABLET | Freq: Every day | ORAL | Status: DC
Start: 2022-07-12 — End: 2022-07-13
  Administered 2022-07-12 – 2022-07-13 (×2): 40 mg via ORAL
  Filled 2022-07-12 (×2): qty 2

## 2022-07-12 MED ORDER — ENOXAPARIN SODIUM 40 MG/0.4ML IJ SOSY: 40.0000 mg | PREFILLED_SYRINGE | INTRAMUSCULAR | Status: DC

## 2022-07-12 MED ORDER — PANTOPRAZOLE SODIUM 40 MG PO TBEC
40.0000 mg | DELAYED_RELEASE_TABLET | Freq: Every day | ORAL | Status: DC
  Administered 2022-07-12 – 2022-07-13 (×2): 40 mg via ORAL
  Filled 2022-07-12 (×2): qty 1

## 2022-07-12 MED ORDER — METOPROLOL SUCCINATE ER 50 MG PO TB24
50.0000 mg | ORAL_TABLET | Freq: Every day | ORAL | Status: DC
  Administered 2022-07-13: 50 mg via ORAL
  Filled 2022-07-12: qty 1

## 2022-07-12 MED ORDER — GABAPENTIN 100 MG PO CAPS
100.0000 mg | ORAL_CAPSULE | Freq: Three times a day (TID) | ORAL | Status: DC
  Administered 2022-07-12 – 2022-07-13 (×4): 100 mg via ORAL
  Filled 2022-07-12 (×4): qty 1

## 2022-07-12 MED ORDER — FUROSEMIDE 10 MG/ML IJ SOLN
20.0000 mg | Freq: Once | INTRAMUSCULAR | Status: AC
  Administered 2022-07-12: 20 mg via INTRAVENOUS
  Filled 2022-07-12: qty 2

## 2022-07-12 MED ORDER — HYDROCODONE-ACETAMINOPHEN 5-325 MG PO TABS
1.0000 | ORAL_TABLET | Freq: Once | ORAL | Status: AC
  Administered 2022-07-12: 1 via ORAL
  Filled 2022-07-12: qty 1

## 2022-07-12 MED ORDER — METOPROLOL SUCCINATE ER 25 MG PO TB24
25.0000 mg | ORAL_TABLET | Freq: Once | ORAL | Status: AC
  Administered 2022-07-12: 25 mg via ORAL
  Filled 2022-07-12: qty 1

## 2022-07-12 MED ORDER — FERROUS SULFATE 325 (65 FE) MG PO TABS
325.0000 mg | ORAL_TABLET | Freq: Every day | ORAL | Status: DC
  Administered 2022-07-12 – 2022-07-13 (×2): 325 mg via ORAL
  Filled 2022-07-12 (×2): qty 1

## 2022-07-12 MED ORDER — HYDROCODONE-ACETAMINOPHEN 10-325 MG PO TABS
1.0000 | ORAL_TABLET | Freq: Four times a day (QID) | ORAL | Status: DC | PRN
  Administered 2022-07-12: 1 via ORAL
  Filled 2022-07-12: qty 1

## 2022-07-12 MED ORDER — METOPROLOL SUCCINATE ER 25 MG PO TB24
25.0000 mg | ORAL_TABLET | Freq: Every day | ORAL | Status: DC
  Administered 2022-07-12: 25 mg via ORAL
  Filled 2022-07-12: qty 1

## 2022-07-12 MED ORDER — IPRATROPIUM-ALBUTEROL 0.5-2.5 (3) MG/3ML IN SOLN
3.0000 mL | RESPIRATORY_TRACT | Status: AC
  Administered 2022-07-12 (×2): 3 mL via RESPIRATORY_TRACT
  Filled 2022-07-12 (×2): qty 3

## 2022-07-12 MED ORDER — FESOTERODINE FUMARATE ER 4 MG PO TB24
4.0000 mg | ORAL_TABLET | Freq: Every day | ORAL | Status: DC
  Administered 2022-07-12 – 2022-07-13 (×2): 4 mg via ORAL
  Filled 2022-07-12 (×3): qty 1

## 2022-07-12 MED ORDER — ALBUTEROL SULFATE (2.5 MG/3ML) 0.083% IN NEBU
2.5000 mg | INHALATION_SOLUTION | Freq: Once | RESPIRATORY_TRACT | Status: AC
  Administered 2022-07-12: 2.5 mg via RESPIRATORY_TRACT
  Filled 2022-07-12: qty 3

## 2022-07-12 MED ORDER — BISACODYL 5 MG PO TBEC: 10.0000 mg | DELAYED_RELEASE_TABLET | Freq: Every day | ORAL | Status: DC | PRN

## 2022-07-12 MED ORDER — POLYETHYLENE GLYCOL 3350 17 G PO PACK
17.0000 g | PACK | Freq: Every day | ORAL | Status: DC
  Administered 2022-07-12 – 2022-07-13 (×2): 17 g via ORAL
  Filled 2022-07-12 (×2): qty 1

## 2022-07-12 MED ORDER — IOHEXOL 350 MG/ML SOLN
60.0000 mL | Freq: Once | INTRAVENOUS | Status: AC | PRN
  Administered 2022-07-12: 60 mL via INTRAVENOUS

## 2022-07-12 NOTE — H&P (Addendum)
Hospital Admission History and Physical Service Pager: [phone]  Patient name: Nicole Perez Medical record number: [id number] Date of Birth: 08-21-1933 Age: 88 y.o. Gender: female  Primary Care Provider: McDiarmid, Blane Ohara, MD Consultants:  Code Status: Full Preferred Emergency Contact:   Name Relation Home Work Mobile   Robinson,Velna Daughter   [phone]   Neal,Della Daughter [phone]  [phone]   Kreitzer,Leddie Daughter   [phone]   Mcbride,Robin Daughter   [phone]    Chief Complaint:   Assessment and Plan: Nicole Perez is a 88 y.o. female presenting with dyspnea. Differential includes COPD, fluid overload, PE (unlikely given negative CTA), pneumonia.  Hypoxia Nicole Perez is a 88 year old female presents today with a 3-day history of dyspnea.  She reports orthopnea and significant dyspnea on minimal exertion. Of note patient was admitted to [facility] on 7/10 to 7/13 for symptomatic anemia.  Vitals on admission: Bp 152/96, sats 97% on air, desatted to 70s on ambulation, HR 86, RR 30. Labs: BNP 360, trop 20>19, Hg 9, gluc 154, GFR 55. CXR: Bibasilar atelectasis with possible infiltrate, CTA negative for PE, trace bilaterally pleural effusions.  Most recent echo was done on 7/11, EF 60 to 65%, unable to assess diastolic function at this time.  Patient most likely has CHF exacerbation with mild COPD symptoms too.  In the ED received DuoNebs, Solu-Medrol, albuterol and IV Lasix 20 mg with improvement in symptoms. Will admit for further medical management given that patient was hypoxic on ambulation - Admit to FMTS, attending Dr. Gwendlyn Deutscher - Continuous cardiac and O2 monitoring - Vitals per routine -Daily weights -Strict I's and O's -Further Lasix dose in 6 hours -SCDs for DVT prophylaxis -AM CBC and BMP -PT OT  Emphysema of lung (Nicole Perez) Upon presentation in the ER, was treated as a COPD exacerbation and received Nebulizer treatments and steroids. Aats currently 97% on  RA -Duobnebs Q4H x 3 doses - Prednisone '40mg'$  x 5 days - Supplemental O2 as needed - Continuous O2 monitoring -Goal O2 88-92%  Paroxysmal atrial fibrillation (HCC) EKG: A fib. Patient not on anticoagulation due to recent symptomatic anemia with acute bleed.  - Continuous cardiac monitoring - Continue metoprolol '25mg'$  daily   Chronic pain syndrome Has controlled prescriptions for chronic back pain.  - Continue Nocro 10-'325mg'$  q6h PRN for pain  Essential hypertension, benign Blood pressure elevated to 156/92 on admission. Patient recently discharged from hospital and home HCTZ was discontinued at that time. Will likely need lasix as an outpatient. - Vitals per routine   FEN/GI: Regular diet VTE Prophylaxis: SCDs  Disposition: Medical telemetry  History of Present Illness:  Nicole Perez is a 88 y.o. female presenting with dyspnea.  Reports 3-day history of dyspnea worse on exertion and laying flat.  Whenever she tried to get up she would finally stop catching her breath.  She also had worsening peripheral edema and dizziness associated with this.  She denies having the symptoms before.  Denies chest pain, cough, palpitations or fevers.  Denies sick contacts.  Does not take Lasix at home.  Compliant with all of her other medications.  Self catheterizes at home.  Eating and drinking well. Lives alone but daughters live nearby.  In the ED, initially treated for COPD and had CTA PE that was negative. Lasix 20 mg IV, albuterol, DuoNebs and Methylpred with some improvement in her symptoms.  Patient desaturated to 70s on ambulation therefore required admission.   Review Of Systems: Per HPI with the  following additions:   Pertinent Past Medical History: Right leg DVT, Hx of lower GI bleed, emphysema, chronic pain, severe spinal stenosis, HTN, aortic aneurysm, polyneuropathy, neurogenic bladder Remainder reviewed in history tab.   Pertinent Past Surgical History: Colonic embolization,  bladder suspension, laminectomy, Clipping of Jejunal GI bleed (2017)  Remainder reviewed in history tab.  Pertinent Social History: Tobacco use: Former 0.25 PPD x20 years Alcohol use: Non Other Substance use: Non Lives alone   Pertinent Family History: Remainder reviewed in history tab.   Important Outpatient Medications: Bisacodyl '10mg'$  daily Ferrous sulfate '325mg'$  daily Gabapentin '300mg'$  TID Hydrocodone-acetaminophen 10-'325mg'$  q6h PRN Metoprolol '25mg'$  daily Pantoprazole '40mg'$  daily Solifenacin '5mg'$  daily PRN Remainder reviewed in medication history.   Objective: BP (!) 168/118   Pulse (!) 103   Temp 98.8 F (37.1 C)   Resp (!) 23   Ht '4\' 11"'$  (1.499 m)   Wt 69.3 kg   LMP 12/17/1962   SpO2 97%   BMI 30.86 kg/m   Exam: General: 88 yr old female, appears stated age, no acute distress Eyes: Normal extraocular eye movements ENTM: poor dentition, No pharyngeal edema, no thyromegaly no lymphadenopathy Neck: Supple Cardiovascular: Irregular rhythm Respiratory: Bilateral wheezing crackles Gastrointestinal: Abdomen soft nontender MSK: 1+ pedal edema bilaterally Derm: Warm and dry Neuro: Cranial nerves grossly intact Psych: Normal mood, normal affect  Labs:  CBC BMET  Recent Labs  Lab 07/12/22 0508  WBC 5.3  HGB 9.5*  HCT 29.3*  PLT 235   Recent Labs  Lab 07/12/22 0508  NA 138  K 3.9  CL 106  CO2 21*  BUN 22  CREATININE 0.99  GLUCOSE 154*  CALCIUM 9.3    Pertinent additional labs .  EKG: My own interpretation Afib    Imaging Studies Performed:  CXR IMPRESSION: Low lung volumes with mild bibasilar atelectasis and/or infiltrate.  CTA PE IMPRESSION: No evidence of pulmonary embolism. 4.2 cm descending thoracic aortic aneurysm, unchanged. Improving nodular opacities in the medial right upper lobe and left lower lobe, likely infectious/inflammatory. Given short interval improvement, no follow-up is recommended per Fleischner Society guidelines. Small  bilateral pleural effusions, right greater than left, new. Aortic Atherosclerosis (ICD10-I70.0) and Emphysema (ICD10-J43.9).  My Interpretation: trace pleural effusions bilaterally    Lattie Haw, MD 07/12/2022, 6:30 AM PGY-3, [place] [facility] pager: [phone], text pages welcome Secure chat group [place]

## 2022-07-12 NOTE — Assessment & Plan Note (Addendum)
Acute, resolving. Patient presented with orthopnea and significant dyspnea on minimal exertion.  Exam some fluid overload.  Most recent echo was done on 7/11, EF 60 to 65%, unable to assess diastolic function at this time.  On admission discussion between COPD exacerbation versus heart failure however clinical symptoms most consistent with heart failure with decreased dyspnea on diuresis. - Ambulatory pulse ox before discharge - Home on PRN '20mg'$  Lasix - Vitals per routine - Daily weights -Strict I's and O's -Stop lasix -SCDs for DVT prophylaxis -AM CBC and BMP -PT OT

## 2022-07-12 NOTE — ED Notes (Signed)
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 upper lobe and left lower lobe, likely infectious/inflammatory. Given short interval improvement, no follow-up is recommended per Fleischner Society guidelines. Small bilateral pleural effusions, right greater than left, new. Aortic Atherosclerosis (ICD10-I70.0) and Emphysema (ICD10-J43.9). Electronically Signed   By: Julian Hy M.D.   On: 07/12/2022 01:30   DG Chest Port 1 View  Result Date: 07/11/2022 CLINICAL DATA:  Shortness of breath. EXAM: PORTABLE CHEST 1 VIEW  COMPARISON:  June 22, 2022 FINDINGS: The heart size and mediastinal contours are within normal limits. Aneurysmal dilatation of the descending thoracic aorta is again seen. Low lung volumes are noted with mild atelectasis and/or infiltrate seen within the bilateral lung bases. There is no evidence of a pleural effusion or pneumothorax. Multilevel degenerative changes seen throughout the thoracic spine. IMPRESSION: 1. Low lung volumes with mild bibasilar atelectasis and/or infiltrate. Electronically Signed   By: Virgina Norfolk M.D.   On: 07/11/2022 22:06    Pending Labs Unresulted Labs (From admission, onward)    None       Vitals/Pain Today's Vitals   07/12/22 0910 07/12/22 0911 07/12/22 0915 07/12/22 1100  BP:  (!) 149/128 (!) 142/112 (!) 175/78  Pulse:  (!) 137 (!) 112 91  Resp:   (!) 21 (!) 6  Temp:      TempSrc:      SpO2:   98% 95%  Weight:      Height:      PainSc: 7        Isolation Precautions No active isolations  Medications Medications  bisacodyl (DULCOLAX) EC tablet 10 mg (has no administration in time range)  pantoprazole (PROTONIX) EC tablet 40 mg (40 mg Oral Given 07/12/22 0807)  ferrous sulfate tablet 325 mg (325 mg Oral Given 07/12/22 0807)  HYDROcodone-acetaminophen (NORCO) 10-325 MG per tablet 1 tablet (has no administration in time range)  metoprolol succinate (TOPROL-XL) 24 hr tablet 25 mg (25 mg Oral Given 07/12/22 0911)  fesoterodine (TOVIAZ) tablet 4 mg (4 mg Oral Given 07/12/22 1015)  ipratropium-albuterol (DUONEB) 0.5-2.5 (3) MG/3ML nebulizer solution 3 mL (3 mLs Nebulization Given 07/12/22 0835)  predniSONE (DELTASONE) tablet 40 mg (40 mg Oral Given 07/12/22 0807)  gabapentin (NEURONTIN) capsule 100 mg (100 mg Oral Given 07/12/22 0911)  ipratropium-albuterol (DUONEB) 0.5-2.5 (3) MG/3ML nebulizer solution 3 mL (3 mLs Nebulization Given 07/11/22 2159)  methylPREDNISolone sodium succinate (SOLU-MEDROL) 125 mg/2 mL injection 125 mg (125 mg Intravenous Given  07/12/22 0009)  albuterol (PROVENTIL) (2.5 MG/3ML) 0.083% nebulizer solution 2.5 mg (2.5 mg Nebulization Given 07/12/22 0202)  iohexol (OMNIPAQUE) 350 MG/ML injection 60 mL (60 mLs Intravenous Contrast Given 07/12/22 0117)  furosemide (LASIX) injection 20 mg (20 mg Intravenous Given 07/12/22 0437)  HYDROcodone-acetaminophen (NORCO/VICODIN) 5-325 MG per tablet 1 tablet (1 tablet Oral Given 07/12/22 0437)    Mobility walks with device Low fall risk   Focused Assessments   R Recommendations: See Admitting Provider Note  Report given to:  Additional Notes:  Pt is not on oxygen, and uses a Purewick currently. Pt self-cath's 3 times daily, continues to leak urine throughout the day but will cath when has full bladder feeling. Daughter at bedside.

## 2022-07-12 NOTE — ED Notes (Signed)
Pt self caths at home but still makes some urine without  cathing. This RN cathed pt and got approx 223m out. Pt had also saturated her brief.

## 2022-07-12 NOTE — Progress Notes (Signed)
Daily Progress Note Intern Pager: [phone]  Patient name: Nicole Perez Medical record number: [id number] Date of birth: 01-10-1933 Age: 88 y.o. Gender: female  Primary Care Provider: McDiarmid, Blane Ohara, MD Consultants: None Code Status: Full  Pt Overview and Major Events to Date:  -07/12/22  Assessment and Plan:  [name]. 88 y.o female presenting with dyspnea, admitted for likely mixed picture acute heart failure and COPD exacerbation. Pertinent PMH/PSH includes Right leg DVT, Hx of lower GI bleed, emphysema, chronic pain, severe spinal stenosis, HTN, aortic aneurysm, polyneuropathy, neurogenic bladder.  Hypoxia Acute, resolving. Patient presented with orthopnea and significant dyspnea on minimal exertion.  Exam some fluid overload.  Most recent echo was done on 7/11, EF 60 to 65%, unable to assess diastolic function at this time.  On admission discussion between COPD exacerbation versus heart failure however clinical symptoms most consistent with heart failure with decreased dyspnea on diuresis. - Vitals per routine - Daily weights -Strict I's and O's -20 mg IV Lasix this afternoon -SCDs for DVT prophylaxis -AM CBC and BMP -PT OT  Emphysema of lung ([place] [facility] [place]) Upon presentation in the ER, was treated as a COPD exacerbation and received Nebulizer treatments and steroids. Aats currently 97% on RA -Duobnebs Q4H x 3 doses - Prednisone '40mg'$  x 5 days - Supplemental O2 as needed - Continuous O2 monitoring -Goal O2 88-92%  Paroxysmal atrial fibrillation (HCC) EKG: A fib. Patient not on anticoagulation due to recent symptomatic anemia with acute bleed.  - Continuous cardiac monitoring - Continue metoprolol '25mg'$  daily   Chronic pain syndrome Has controlled prescriptions for chronic back pain.  - Continue Nocro 10-'325mg'$  q6h PRN for pain -Lower gabapentin dose to 100 mg 3 times daily  Essential hypertension, benign Blood pressure elevated to 156/92 on admission. Patient recently  discharged from hospital and home HCTZ was discontinued at that time. Will likely need lasix as an outpatient. - Vitals per routine       FEN/GI: Regular PPx: SCDs, previous bleed risk Dispo:Pending clinical improvement  Subjective:  No acute events since admission.  That her breathing is better.  No new chest pain.  No nausea or vomiting  Objective: Temp:  [97.7 F (36.5 C)-99.2 F (37.3 C)] 98.1 F (36.7 C) (07/27 1245) Pulse Rate:  [52-152] 83 (07/27 1245) Resp:  [6-25] 22 (07/27 1245) BP: (122-175)/(66-129) 141/66 (07/27 1245) SpO2:  [94 %-100 %] 95 % (07/27 1245) Weight:  [69.3 kg-71.2 kg] 71.2 kg (07/27 1245) Physical Exam: General: NAD, lying comfortably in hospital bed Cardiovascular: Irregular rate and rhythm, no murmurs, 1+ pitting edema to midshin bilaterally Respiratory: normal WOB on room air, CTAB, no wheezes, ronchi or rales Abdomen: soft, NTTP, no rebound or guarding Extremities: Moving all 4 extremities equally   Laboratory: Most recent CBC Lab Results  Component Value Date   WBC 5.3 07/12/2022   HGB 9.5 (L) 07/12/2022   HCT 29.3 (L) 07/12/2022   MCV 88.5 07/12/2022   PLT 235 07/12/2022   Most recent BMP    Latest Ref Rng & Units 07/12/2022    5:08 AM  BMP  Glucose 70 - 99 mg/dL 154   BUN 8 - 23 mg/dL 22   Creatinine 0.44 - 1.00 mg/dL 0.99   Sodium 135 - 145 mmol/L 138   Potassium 3.5 - 5.1 mmol/L 3.9   Chloride 98 - 111 mmol/L 106   CO2 22 - 32 mmol/L 21   Calcium 8.9 - 10.3 mg/dL 9.3  Other pertinent labs: Trops 20>19, BNP 360.5  Imaging/Diagnostic Tests: Radiologist Impression:   DG Chest 07/12/22 IMPRESSION: 1. Low lung volumes with mild bibasilar atelectasis and/or infiltrate. My interpretation: Bilateral left and right lower lung zone opacities  CT Angio Chest PE W and/or Wo Contrast IMPRESSION: No evidence of pulmonary embolism.   4.2 cm descending thoracic aortic aneurysm, unchanged.   Improving nodular opacities in  the medial right upper lobe and left lower lobe, likely infectious/inflammatory. Given short interval improvement, no follow-up is recommended per Fleischner Society guidelines.   Small bilateral pleural effusions, right greater than left, new.  Salvadore Oxford, MD 07/12/2022, 1:50 PM  PGY-1, [place] Intern pager: [phone], text pages welcome Secure chat group [place]

## 2022-07-12 NOTE — Assessment & Plan Note (Addendum)
Blood pressure elevated to 156/92 on admission. Patient recently discharged from hospital and home HCTZ was discontinued at that time. Will likely need lasix as an outpatient. - Vitals per routine

## 2022-07-12 NOTE — TOC Initial Note (Signed)
Transition of Care [facility]) - Initial/Assessment Note    Patient Details  Name: JOYCELYN Perez MRN: [id number] Date of Birth: [birth year]  Transition of Care [facility]) CM/SW Contact:    Ninfa Meeker, RN Phone Number: 07/12/2022, 3:03 PM  Clinical Narrative:                 Transition of Care Screening Note:  Transition of Care Department [facility]) has reviewed patient and no TOC needs have been identified at this time. We will continue to monitor patient advancement through Interdisciplinary progressions. If new patient transition needs arise, please place a consult.          Patient Goals and CMS Choice        Expected Discharge Plan and Services                                                Prior Living Arrangements/Services                       Activities of Daily Living      Permission Sought/Granted                  Emotional Assessment              Admission diagnosis:  Peripheral edema [R60.9] COPD exacerbation ([place]) [J44.1] CHF exacerbation ([place]) [I50.9] Longstanding persistent atrial fibrillation ([place]) [I48.11] Patient Active Problem List   Diagnosis Date Noted   Hypoxia 07/12/2022   CHF exacerbation ([place]) 07/12/2022   AVM (arteriovenous malformation) of stomach, acquired    AVM (arteriovenous malformation) of small bowel, acquired    Stage 3a chronic kidney disease (CKD) ([place]) 06/26/2022   Acute blood loss anemia 06/25/2022   Pulmonary nodules 06/25/2022   Paroxysmal atrial fibrillation ([place]) 06/24/2022   Primary osteoarthritis, right ankle and foot 06/21/2022   Acute exacerbation of chronic obstructive pulmonary disease (COPD) ([place]) 06/21/2022   Right leg DVT ([place]), uncertain age 08/65/7846   Mild PAD (peripheral artery disease) ([place]) 03/30/2022    Class: Question of   Arthritis, midfoot 03/30/2022   Contracture of toes and great toes of left foot 03/30/2022   Venous insufficiency 10/26/2021   Abnormal posture  07/25/2021   Abnormality of gait due to impairment of balance 03/03/2021   Abdominal aortic atherosclerosis ([place]) 06/08/2020   Aortic aneurysm without rupture ([place]) 06/08/2020   Cholelithiasis 06/08/2020   Emphysema of lung ([place]) 06/06/2020   Right flank pain, chronic 05/31/2020   Chronic, continuous use of opioids 08/14/2019   Chronic cystitis 06/29/2019   Chronic pain syndrome 02/27/2019   Recurrent UTI 04/24/2018   Osteoarthritis of both sacroiliac joints ([place]) 08/17/2017   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[place]) 05/30/2017   Sinoatrial block, History of 04/30/2017    Class: History of   Impaired functional mobility, balance, gait, and endurance 03/25/2017   Hemorrhoid prolapse 11/16/2016   History of Dieulafoy lesion of jejunum w hemorrhage 05/04/2016   Dry eye syndrome 11/16/2014   Glaucoma suspect, bilateral 11/16/2014   Peroneal neuropathy 09/30/2014   Cervical spondylosis without myelopathy 08/07/2014   At risk for falls 08/06/2014   Memory impairment 08/06/2014   Polyneuropathy associated with underlying disease ([place]) 08/06/2014   Bladder neurogenous 02/09/2014   Osteoarthritis, multiple sites 08/11/2013   Cystocele with prolapse 08/11/2013   Osteoarthritis of both knees 04/22/2013  Vitamin D deficiency 09/30/2012   Hearing loss sensory, bilateral 07/26/2011   Severe Spinal Thoracic Stenosis 07/26/2011   Severe Lumbar Spinal Stenosis  07/19/2335   Lichen sclerosus et atrophicus of the vulva    Pure hypercholesterolemia 05/10/2010   Osteoarthritis of spine with myelopathy, thoracolumbar region 04/21/2010   Insomnia disorder, psychophysiologic type 04/18/2010   Essential hypertension, benign 04/18/2010   Chronic back pain 04/18/2010   Osteoarthritis of both hips 09/11/2007   PCP:  McDiarmid, Blane Ohara, MD Pharmacy:   [facility] [address], [place] [address] Phone: [phone] Fax: [phone]  [id number] - [place] [place] [address] Phone: [phone] Fax: [phone]  [facility] [address] [place] [address] Phone: [phone] Fax: [phone]     Social Determinants of Health (SDOH) Interventions    Readmission Risk Interventions     No data to display

## 2022-07-12 NOTE — Evaluation (Signed)
Occupational Therapy Evaluation Patient Details Name: Nicole Perez MRN: [id number] DOB: 1933/06/01 Today's Date: 07/12/2022   History of Present Illness pt is and 88 y/o female admitted 7/26 for SOB worsening over the past few days in conjunction with bil LE swelling.  Work up included treatment for COPD exacerbation and paraxysmal Afib.  PMH includes HTN, CKD 3, DVT, aortic aneurysm, chronic pain.   Clinical Impression   Pt admitted for concerns listed above. PTA pt reported that she was independent with all ADL's and IADL's, including catheterizing herself. At this time, pt presents with increased weakness, balance deficits, and decreased activity tolerance. She requires min guard to min A for safety with functional mobility and ADL's. This session she had 1 LoB in the bathroom, and required min A to correct. Recommending HHOT to maximize independence and safety at home. OT will follow acutely.      Recommendations for follow up therapy are one component of a multi-disciplinary discharge planning process, led by the attending physician.  Recommendations may be updated based on patient status, additional functional criteria and insurance authorization.   Follow Up Recommendations  Home health OT    Assistance Recommended at Discharge Frequent or constant Supervision/Assistance  Patient can return home with the following A little help with walking and/or transfers;A little help with bathing/dressing/bathroom;Assistance with cooking/housework;Help with stairs or ramp for entrance    Functional Status Assessment  Patient has had a recent decline in their functional status and demonstrates the ability to make significant improvements in function in a reasonable and predictable amount of time.  Equipment Recommendations  None recommended by OT    Recommendations for Other Services       Precautions / Restrictions Precautions Precautions: Fall Precaution Comments: urine incontinence  (wears Depends at home) Restrictions Weight Bearing Restrictions: No      Mobility Bed Mobility Overal bed mobility: Modified Independent                  Transfers Overall transfer level: Needs assistance Equipment used: Rolling walker (2 wheels) Transfers: Sit to/from Stand Sit to Stand: Min guard, Min assist           General transfer comment: Min gurad from recliner, min A from toilet with LoB      Balance Overall balance assessment: Needs assistance Sitting-balance support: No upper extremity supported Sitting balance-Leahy Scale: Good     Standing balance support: Single extremity supported, Bilateral upper extremity supported, No upper extremity supported, During functional activity Standing balance-Leahy Scale: Poor Standing balance comment: reliant on UE's or external support                           ADL either performed or assessed with clinical judgement   ADL Overall ADL's : Needs assistance/impaired Eating/Feeding: Set up;Sitting   Grooming: Min guard;Standing   Upper Body Bathing: Set up;Sitting   Lower Body Bathing: Minimal assistance;Sitting/lateral leans;Sit to/from stand   Upper Body Dressing : Set up;Sitting   Lower Body Dressing: Minimal assistance;Sitting/lateral leans;Sit to/from stand   Toilet Transfer: Minimal assistance;Rolling walker (2 wheels)   Toileting- Clothing Manipulation and Hygiene: Minimal assistance;Sitting/lateral lean;Sit to/from stand       Functional mobility during ADLs: Minimal assistance;Rolling walker (2 wheels) General ADL Comments: Limited due to balance deficits and weakness     Vision Baseline Vision/History: 1 Wears glasses Ability to See in Adequate Light: 0 Adequate Patient Visual Report: No change from baseline Vision Assessment?:  No apparent visual deficits     Perception     Praxis      Pertinent Vitals/Pain Pain Assessment Pain Assessment: Faces Faces Pain Scale: Hurts a  little bit Pain Location: bilateral feet Pain Descriptors / Indicators: Discomfort Pain Intervention(s): Monitored during session     Hand Dominance Right   Extremity/Trunk Assessment Upper Extremity Assessment Upper Extremity Assessment: Overall WFL for tasks assessed   Lower Extremity Assessment Lower Extremity Assessment: Defer to PT evaluation   Cervical / Trunk Assessment Cervical / Trunk Assessment: Normal   Communication Communication Communication: HOH   Cognition Arousal/Alertness: Awake/alert Behavior During Therapy: WFL for tasks assessed/performed Overall Cognitive Status: Within Functional Limits for tasks assessed                                       General Comments  highest HR noted returning from bathroom was 200 - RN notified    Exercises     Shoulder Instructions      Home Living Family/patient expects to be discharged to:: Private residence Living Arrangements: Children Available Help at Discharge: Family;Available PRN/intermittently Type of Home: House Home Access: Ramped entrance     Home Layout: One level     Bathroom Shower/Tub: Occupational psychologist: Handicapped height Bathroom Accessibility: Yes   Home Equipment: Conservation officer, nature (2 wheels);Shower seat;Grab bars - tub/shower   Additional Comments: lives with daughter; multiple other family members nearby to assist as needed      Prior Functioning/Environment Prior Level of Function : Independent/Modified Independent             Mobility Comments: typically mod indep ambulating with RW ("she can walk a long ways" per daughter). does not drive ADLs Comments: Mod indep with bathing with use of shower seat. Manages her own medications.  Self-caths without assist.   Daughter assists with household tasks (cooking, cleaning)        OT Problem List: Decreased strength;Decreased activity tolerance;Impaired balance (sitting and/or standing);Decreased safety  awareness;Cardiopulmonary status limiting activity      OT Treatment/Interventions: Self-care/ADL training;Therapeutic exercise;Energy conservation;DME and/or AE instruction;Therapeutic activities;Patient/family education;Balance training    OT Goals(Current goals can be found in the care plan section) Acute Rehab OT Goals Patient Stated Goal: To go home OT Goal Formulation: With patient Time For Goal Achievement: 07/26/22 Potential to Achieve Goals: Good ADL Goals Pt Will Perform Grooming: with modified independence;standing Pt Will Perform Lower Body Bathing: with modified independence;sitting/lateral leans;sit to/from stand Pt Will Perform Lower Body Dressing: with modified independence;sitting/lateral leans;sit to/from stand Pt Will Transfer to Toilet: with modified independence;ambulating Pt Will Perform Toileting - Clothing Manipulation and hygiene: with modified independence;sitting/lateral leans;sit to/from stand  OT Frequency: Min 2X/week    Co-evaluation              AM-PAC OT "6 Clicks" Daily Activity     Outcome Measure Help from another person eating meals?: A Little Help from another person taking care of personal grooming?: A Little Help from another person toileting, which includes using toliet, bedpan, or urinal?: A Little Help from another person bathing (including washing, rinsing, drying)?: A Little Help from another person to put on and taking off regular upper body clothing?: A Little Help from another person to put on and taking off regular lower body clothing?: A Little 6 Click Score: 18   End of Session Equipment Utilized During Treatment: Gait  belt;Rolling walker (2 wheels) Nurse Communication: Mobility status  Activity Tolerance: Patient tolerated treatment well Patient left: in bed;with call bell/phone within reach  OT Visit Diagnosis: Unsteadiness on feet (R26.81);Other abnormalities of gait and mobility (R26.89);Muscle weakness (generalized)  (M62.81)                Time: 7034-0352 OT Time Calculation (min): 42 min Charges:  OT General Charges $OT Visit: 1 Visit OT Evaluation $OT Eval Moderate Complexity: 1 Mod OT Treatments $Self Care/Home Management : 23-37 mins  Saara Kijowski H., OTR/L Acute Rehabilitation  Aviel Davalos Elane Yolanda Bonine 07/12/2022, 6:42 PM

## 2022-07-12 NOTE — Assessment & Plan Note (Addendum)
Has controlled prescriptions for chronic back pain.  - Continue Nocro 10-'325mg'$  q6h PRN for pain -Lower gabapentin dose to 100 mg 3 times daily

## 2022-07-12 NOTE — Assessment & Plan Note (Addendum)
EKG: A fib. Patient not on anticoagulation due to recent symptomatic anemia with acute bleed.  - Continuous cardiac monitoring - Increase to metoprolol '50mg'$  daily

## 2022-07-12 NOTE — Evaluation (Signed)
Physical Therapy Evaluation Patient Details Name: Nicole Perez MRN: [id number] DOB: Sep 09, 1933 Today's Date: 07/12/2022  History of Present Illness  pt is and 88 y/o female admitted 7/26 for SOB worsening over the past few days in conjunction with bil LE swelling.  Work up included treatment for COPD exacerbation and paraxysmal Afib.  PMH includes HTN, CKD 3, DVT, aortic aneurysm, chronic pain.  Clinical Impression  Pt admitted with/for SOB with COPD exac.  Pt is presently swollen and anxious about being up on her feet for transfers or gait needing minimal assist..  Pt currently limited functionally due to the problems listed below.  (see problems list.)  Pt will benefit from PT to maximize function and safety to be able to get home safely with available assist.        Recommendations for follow up therapy are one component of a multi-disciplinary discharge planning process, led by the attending physician.  Recommendations may be updated based on patient status, additional functional criteria and insurance authorization.  Follow Up Recommendations Home health PT      Assistance Recommended at Discharge Frequent or constant Supervision/Assistance  Patient can return home with the following  A little help with walking and/or transfers;A little help with bathing/dressing/bathroom;Assistance with cooking/housework;Assist for transportation;Help with stairs or ramp for entrance    Equipment Recommendations None recommended by PT  Recommendations for Other Services       Functional Status Assessment Patient has had a recent decline in their functional status and demonstrates the ability to make significant improvements in function in a reasonable and predictable amount of time.     Precautions / Restrictions Precautions Precautions: Fall Precaution Comments: urine incontinence (wears Depends at home)      Mobility  Bed Mobility Overal bed mobility: Modified Independent                   Transfers Overall transfer level: Needs assistance   Transfers: Sit to/from Stand, Bed to chair/wheelchair/BSC Sit to Stand: Min guard Stand pivot transfers: Min assist         General transfer comment: pt very guard during standing and transfer, moving slowly/tentatively and never letting go of stationary surfaces until confindent in not falling.    Ambulation/Gait               General Gait Details: NT today  Stairs            Wheelchair Mobility    Modified Rankin (Stroke Patients Only)       Balance Overall balance assessment: Needs assistance   Sitting balance-Leahy Scale: Fair     Standing balance support: Single extremity supported, Bilateral upper extremity supported, No upper extremity supported, During functional activity Standing balance-Leahy Scale: Poor Standing balance comment: reliant on UE's or external support                             Pertinent Vitals/Pain Pain Assessment Pain Assessment: Faces Faces Pain Scale: Hurts a little bit Pain Location: bilateral feet Pain Descriptors / Indicators: Discomfort Pain Intervention(s): Monitored during session    Home Living Family/patient expects to be discharged to:: Private residence Living Arrangements: Children Available Help at Discharge: Family;Available PRN/intermittently Type of Home: House Home Access: Ramped entrance       Home Layout: One level Home Equipment: Conservation officer, nature (2 wheels);Shower seat;Grab bars - tub/shower Additional Comments: lives with daughter; multiple other family members nearby to assist as needed  Prior Function Prior Level of Function : Independent/Modified Independent             Mobility Comments: typically mod indep ambulating with RW ("she can walk a long ways" per daughter). does not drive ADLs Comments: Mod indep with bathing with use of shower seat. Manages her own medications.  Self-caths without assist.   Daughter  assists with household tasks (cooking, cleaning)     Hand Dominance        Extremity/Trunk Assessment   Upper Extremity Assessment Upper Extremity Assessment: Defer to OT evaluation    Lower Extremity Assessment Lower Extremity Assessment: Generalized weakness    Cervical / Trunk Assessment Cervical / Trunk Assessment: Normal  Communication   Communication: HOH  Cognition Arousal/Alertness: Awake/alert Behavior During Therapy: WFL for tasks assessed/performed Overall Cognitive Status: Within Functional Limits for tasks assessed (NT formally)                                          General Comments General comments (skin integrity, edema, etc.): HR irregular, in afib and consistently in the mid 120's with spike up to low 150's.  Activity stopped.    Exercises     Assessment/Plan    PT Assessment Patient needs continued PT services  PT Problem List Decreased strength;Decreased activity tolerance;Decreased balance;Decreased mobility;Cardiopulmonary status limiting activity       PT Treatment Interventions DME instruction;Gait training;Functional mobility training;Therapeutic activities;Therapeutic exercise;Patient/family education    PT Goals (Current goals can be found in the Care Plan section)  Acute Rehab PT Goals Patient Stated Goal: return home PT Goal Formulation: With patient Time For Goal Achievement: 07/10/22 Potential to Achieve Goals: Good    Frequency Min 3X/week     Co-evaluation               AM-PAC PT "6 Clicks" Mobility  Outcome Measure Help needed turning from your back to your side while in a flat bed without using bedrails?: A Little Help needed moving from lying on your back to sitting on the side of a flat bed without using bedrails?: A Little Help needed moving to and from a bed to a chair (including a wheelchair)?: A Little Help needed standing up from a chair using your arms (e.g., wheelchair or bedside chair)?:  A Little Help needed to walk in hospital room?: A Little Help needed climbing 3-5 steps with a railing? : A Little 6 Click Score: 18    End of Session   Activity Tolerance: Patient tolerated treatment well Patient left: in chair;with call bell/phone within reach;with family/visitor present Nurse Communication: Mobility status PT Visit Diagnosis: Other abnormalities of gait and mobility (R26.89);Muscle weakness (generalized) (M62.81)    Time: 3875-6433 PT Time Calculation (min) (ACUTE ONLY): 25 min   Charges:   PT Evaluation $PT Eval Moderate Complexity: 1 Mod PT Treatments $Therapeutic Activity: 8-22 mins        07/12/2022  Ginger Carne., PT Acute Rehabilitation Services [phone]  (pager) [phone]  (office)  Tessie Fass Jerline Linzy 07/12/2022, 6:11 PM

## 2022-07-12 NOTE — Assessment & Plan Note (Addendum)
Upon presentation in the ER, was treated as a COPD exacerbation and received Nebulizer treatments and steroids. Aats currently 97% on RA -Duobnebs Q4H x 3 doses - Prednisone '40mg'$  x 5 days - Supplemental O2 as needed - Continuous O2 monitoring -Goal O2 88-92%

## 2022-07-12 NOTE — ED Provider Notes (Signed)
[facility] EMERGENCY DEPARTMENT Provider Note   CSN: [id number] Arrival date & time: 07/11/22  2106     History  Chief Complaint  Patient presents with   Shortness of Breath    Nicole Perez is a 88 y.o. female.  Pt is an 88 yo female with a pmhx significant for HTN, DVT, Chronic pain, OA, HLD, GI AVMs, afib (not on thinners due to gi bleeds) with rvr, anemia, copd, and hoh (hears better out of left ear).  Pt was admitted from 7/9-7/13 for symptomatic anemia, afib with rvr, and copd exac.  She did have a ct chest while admitted whidh shoed no pe, but it did show some pulm nodules.  Pt was given abx and steroids.  Pt said she has been having worsening sob since d/c.  It is worse with ambulation.          Home Medications Prior to Admission medications   Medication Sig Start Date End Date Taking? Authorizing Provider  bisacodyl 5 MG EC tablet Take 2 tablets (10 mg total) by mouth daily. Patient taking differently: Take 10 mg by mouth daily as needed for moderate constipation. 06/22/21   McDiarmid, Blane Ohara, MD  feeding supplement (ENSURE ENLIVE / ENSURE PLUS) LIQD Take 237 mLs by mouth 2 (two) times daily between meals. 06/28/22   Ezequiel Essex, MD  ferrous sulfate 324 (65 Fe) MG TBEC Take 1 tablet (325 mg total) by mouth daily with breakfast. 07/06/22   Zenia Resides, MD  gabapentin (NEURONTIN) 300 MG capsule Take 1 capsule (300 mg total) by mouth 3 (three) times daily. Patient taking differently: Take 300 mg by mouth daily as needed (pain). 03/29/22   McDiarmid, Blane Ohara, MD  HYDROcodone-acetaminophen (NORCO) 10-325 MG tablet Take 1 tablet by mouth every 6 (six) hours as needed for moderate pain. 07/05/22 08/04/22  Zenia Resides, MD  metoprolol succinate (TOPROL XL) 25 MG 24 hr tablet Take 1 tablet (25 mg total) by mouth daily. 07/05/22 07/05/23  Zenia Resides, MD  pantoprazole (PROTONIX) 40 MG tablet Take 1 tablet (40 mg total) by mouth daily at 6 (six) AM.  07/05/22   Hensel, Jamal Collin, MD  solifenacin (VESICARE) 5 MG tablet Take 5 mg by mouth daily as needed (overactive bladder). 06/20/22   [provider]      Allergies    Ciprofloxacin, Nitrofurantoin, and Keflex [cephalexin]    Review of Systems   Review of Systems  Respiratory:  Positive for cough and shortness of breath.   All other systems reviewed and are negative.   Physical Exam Updated Vital Signs BP (!) 147/96   Pulse (!) 52   Temp 99.2 F (37.3 C)   Resp 19   Ht '4\' 11"'$  (1.499 m)   Wt 69.3 kg   LMP 12/17/1962   SpO2 97%   BMI 30.86 kg/m  Physical Exam Vitals and nursing note reviewed.  Constitutional:      Appearance: She is well-developed.  HENT:     Head: Normocephalic and atraumatic.     Mouth/Throat:     Mouth: Mucous membranes are moist.     Pharynx: Oropharynx is clear.  Eyes:     Extraocular Movements: Extraocular movements intact.     Pupils: Pupils are equal, round, and reactive to light.  Cardiovascular:     Rate and Rhythm: Bradycardia present. Rhythm irregular.  Pulmonary:     Breath sounds: Wheezing present.  Abdominal:     General: Bowel  sounds are normal.     Palpations: Abdomen is soft.  Musculoskeletal:        General: Normal range of motion.     Cervical back: Normal range of motion and neck supple.     Left lower leg: Edema present.  Skin:    General: Skin is warm.     Capillary Refill: Capillary refill takes less than 2 seconds.  Neurological:     General: No focal deficit present.     Mental Status: She is alert and oriented to person, place, and time.  Psychiatric:        Mood and Affect: Mood normal.        Behavior: Behavior normal.     ED Results / Procedures / Treatments   Labs (all labs ordered are listed, but only abnormal results are displayed) Labs Reviewed  BASIC METABOLIC PANEL - Abnormal; Notable for the following components:      Result Value   Glucose, Bld 111 (*)    Creatinine, Ser 1.11 (*)    GFR,  Estimated 48 (*)    All other components within normal limits  CBC WITH DIFFERENTIAL/PLATELET - Abnormal; Notable for the following components:   RBC 3.26 (*)    Hemoglobin 9.2 (*)    HCT 29.3 (*)    All other components within normal limits  BRAIN NATRIURETIC PEPTIDE - Abnormal; Notable for the following components:   B Natriuretic Peptide 360.5 (*)    All other components within normal limits  TROPONIN I (HIGH SENSITIVITY) - Abnormal; Notable for the following components:   Troponin I (High Sensitivity) 20 (*)    All other components within normal limits  SARS CORONAVIRUS 2 BY RT PCR  TROPONIN I (HIGH SENSITIVITY)    EKG EKG Interpretation  Date/Time:  Wednesday July 11 2022 21:07:23 EDT Ventricular Rate:  90 PR Interval:    QRS Duration: 83 QT Interval:  376 QTC Calculation: 461 R Axis:   17 Text Interpretation: Atrial fibrillation Borderline repolarization abnormality Since last tracing rate slower Confirmed by Isla Pence [phone]) on 07/12/2022 12:21:09 AM  Radiology DG Chest Port 1 View  Result Date: 07/11/2022 CLINICAL DATA:  Shortness of breath. EXAM: PORTABLE CHEST 1 VIEW COMPARISON:  June 22, 2022 FINDINGS: The heart size and mediastinal contours are within normal limits. Aneurysmal dilatation of the descending thoracic aorta is again seen. Low lung volumes are noted with mild atelectasis and/or infiltrate seen within the bilateral lung bases. There is no evidence of a pleural effusion or pneumothorax. Multilevel degenerative changes seen throughout the thoracic spine. IMPRESSION: 1. Low lung volumes with mild bibasilar atelectasis and/or infiltrate. Electronically Signed   By: Virgina Norfolk M.D.   On: 07/11/2022 22:06    Procedures Procedures    Medications Ordered in ED Medications  albuterol (PROVENTIL) (2.5 MG/3ML) 0.083% nebulizer solution 2.5 mg (has no administration in time range)  ipratropium-albuterol (DUONEB) 0.5-2.5 (3) MG/3ML nebulizer solution 3  mL (3 mLs Nebulization Given 07/11/22 2159)  methylPREDNISolone sodium succinate (SOLU-MEDROL) 125 mg/2 mL injection 125 mg (125 mg Intravenous Given 07/12/22 0009)    ED Course/ Medical Decision Making/ A&P                           Medical Decision Making Amount and/or Complexity of Data Reviewed Labs: ordered. Radiology: ordered.  Risk Prescription drug management.   This patient presents to the ED for concern of sob, this involves an extensive number of  treatment options, and is a complaint that carries with it a high risk of complications and morbidity.  The differential diagnosis includes copd exac, pna, uri, covid, pt   Co morbidities that complicate the patient evaluation  HTN, DVT, Chronic pain, OA, HLD, GI AVMs, afib (not on thinners due to gi bleeds) with rvr, anemia, copd, and hoh   Additional history obtained:  Additional history obtained from epic chart review External records from outside source obtained and reviewed including family   Lab Tests:  I Ordered, and personally interpreted labs.  The pertinent results include:  cbc with hgb 9.2 (8.5 on 7/20), covid neg, bmp nl, bnp 360.5   Imaging Studies ordered:  I ordered imaging studies including cxr  I independently visualized and interpreted imaging which showed  IMPRESSION:  1. Low lung volumes with mild bibasilar atelectasis and/or  infiltrate.   I agree with the radiologist interpretation   Cardiac Monitoring:  The patient was maintained on a cardiac monitor.  I personally viewed and interpreted the cardiac monitored which showed an underlying rhythm of: afib   Medicines ordered and prescription drug management:  I ordered medication including nebs and steroids  for copd exac  Reevaluation of the patient after these medicines showed that the patient improved I have reviewed the patients home medicines and have made adjustments as needed   Test Considered:  US DVT    Critical  Interventions:  Nebs/steroids   Problem List / ED Course:  SOB:  COPD exac tx with steroids and nebs.  CT chest pending\ L leg swelling:  hx R DVT, but not left.  She is not on blood thinners, but had a recent GI B.   Reevaluation:  After the interventions noted above, I reevaluated the patient and found that they have :improved   Social Determinants of Health:  Lives at home   Dispostion:  Pending CT chest.  Pt signed out to Dr. Dina Rich.        Final Clinical Impression(s) / ED Diagnoses Final diagnoses:  COPD exacerbation ([place])  Longstanding persistent atrial fibrillation [facility])    Rx / DC Orders ED Discharge Orders     None         Isla Pence, MD 07/12/22 0025

## 2022-07-13 ENCOUNTER — Other Ambulatory Visit (HOSPITAL_COMMUNITY): Payer: Self-pay

## 2022-07-13 DIAGNOSIS — J441 Chronic obstructive pulmonary disease with (acute) exacerbation: Secondary | ICD-10-CM | POA: Diagnosis not present

## 2022-07-13 DIAGNOSIS — I5033 Acute on chronic diastolic (congestive) heart failure: Secondary | ICD-10-CM | POA: Diagnosis not present

## 2022-07-13 DIAGNOSIS — I509 Heart failure, unspecified: Secondary | ICD-10-CM | POA: Diagnosis not present

## 2022-07-13 LAB — CBC
HCT: 25 % — ABNORMAL LOW (ref 36.0–46.0)
Hemoglobin: 8 g/dL — ABNORMAL LOW (ref 12.0–15.0)
MCH: 28.5 pg (ref 26.0–34.0)
MCHC: 32 g/dL (ref 30.0–36.0)
MCV: 89 fL (ref 80.0–100.0)
Platelets: 216 10*3/uL (ref 150–400)
RBC: 2.81 MIL/uL — ABNORMAL LOW (ref 3.87–5.11)
RDW: 15.6 % — ABNORMAL HIGH (ref 11.5–15.5)
WBC: 5.8 10*3/uL (ref 4.0–10.5)
nRBC: 0 % (ref 0.0–0.2)

## 2022-07-13 LAB — BASIC METABOLIC PANEL
Anion gap: 11 (ref 5–15)
BUN: 28 mg/dL — ABNORMAL HIGH (ref 8–23)
CO2: 23 mmol/L (ref 22–32)
Calcium: 8.8 mg/dL — ABNORMAL LOW (ref 8.9–10.3)
Chloride: 105 mmol/L (ref 98–111)
Creatinine, Ser: 1.26 mg/dL — ABNORMAL HIGH (ref 0.44–1.00)
GFR, Estimated: 41 mL/min — ABNORMAL LOW (ref >60)
Glucose, Bld: 141 mg/dL — ABNORMAL HIGH (ref 70–99)
Potassium: 4 mmol/L (ref 3.5–5.1)
Sodium: 139 mmol/L (ref 135–145)

## 2022-07-13 LAB — HEMOGLOBIN AND HEMATOCRIT, BLOOD
HCT: 27.8 % — ABNORMAL LOW (ref 36.0–46.0)
Hemoglobin: 8.7 g/dL — ABNORMAL LOW (ref 12.0–15.0)

## 2022-07-13 MED ORDER — ALBUTEROL SULFATE (2.5 MG/3ML) 0.083% IN NEBU: 2.5000 mg | INHALATION_SOLUTION | Freq: Four times a day (QID) | RESPIRATORY_TRACT | Status: DC | PRN

## 2022-07-13 MED ORDER — METOPROLOL SUCCINATE ER 50 MG PO TB24
50.0000 mg | ORAL_TABLET | Freq: Every day | ORAL | 0 refills | Status: DC
  Filled 2022-07-13: qty 30, 30d supply, fill #0

## 2022-07-13 MED ORDER — PREDNISONE 20 MG PO TABS
40.0000 mg | ORAL_TABLET | Freq: Every day | ORAL | 0 refills | Status: DC
  Filled 2022-07-13: qty 4, 2d supply, fill #0

## 2022-07-13 MED ORDER — FUROSEMIDE 20 MG PO TABS
20.0000 mg | ORAL_TABLET | Freq: Every day | ORAL | 11 refills | Status: DC | PRN
  Filled 2022-07-13: qty 30, 30d supply, fill #0

## 2022-07-13 MED ORDER — ALBUTEROL SULFATE HFA 108 (90 BASE) MCG/ACT IN AERS
2.0000 | INHALATION_SPRAY | Freq: Four times a day (QID) | RESPIRATORY_TRACT | 2 refills | Status: DC | PRN
  Filled 2022-07-13: qty 8.5, 25d supply, fill #0

## 2022-07-13 NOTE — Consult Note (Signed)
   [facility] Inpatient Consult   07/13/2022  Nicole Perez 03-Jan-1933 [id number]  [facility] [AC[facility]] Patient: [name]  Patient is a less than 30 days rehospitalization noted  Primary Care Provider:  McDiarmid, Blane Ohara, MD, [place], is an embedded provider with a Chronic Care Management team and program, and is listed for the transition of care follow up and appointments.  Patient was screened for Embedded practice service needs for care coordination needs. Discussion in unit progression meeting and review.  Met with patient and [name] at the bedside to explain reason for rounding visit.  Patient is smiling HOH speak on left side. SDOH for food insecurity, transportation, medication needs. Patient states, "all is good and thank you." Gave patient a 24 hour nurse advise line magnet and a reminder of appointment card for post hospital. Explained that patient could have assess for care coordination needs from Embedded primary care team if needed.  Patient and [name] expresses gratitude for information. No needs noted at this time.  Plan: A referral can be sent for the [place] Management services if needs arises. Provider listed to the [facility] follow up.  Please contact for further questions,  Natividad Brood, RN BSN [facility] Liaison  [phone] business mobile phone Toll free office [phone]  Fax number: [phone] [email or web address]

## 2022-07-13 NOTE — Progress Notes (Signed)
Daily Progress Note Intern Pager: [phone]  Patient name: Nicole Perez Medical record number: [id number] Date of birth: [birth year] Age: 88 y.o. Gender: female  Primary Care Provider: McDiarmid, Blane Ohara, MD Consultants: None Code Status: Full  Pt Overview and Major Events to Date:  -07/12/22 Admitted  Assessment and Plan:  Nicole Perez. 88 y.o female presenting with dyspnea, admitted for likely mixed picture acute heart failure and COPD exacerbation. Pertinent PMH/PSH includes Right leg DVT, Hx of lower GI bleed, emphysema, chronic pain, severe spinal stenosis, HTN, aortic aneurysm, polyneuropathy, neurogenic bladder.  Hypoxia Acute, resolving. Patient presented with orthopnea and significant dyspnea on minimal exertion.  Exam some fluid overload.  Most recent echo was done on 7/11, EF 60 to 65%, unable to assess diastolic function at this time.  On admission discussion between COPD exacerbation versus heart failure however clinical symptoms most consistent with heart failure with decreased dyspnea on diuresis. - Ambulatory pulse ox before discharge - Home on PRN '20mg'$  Lasix - Vitals per routine - Daily weights -Strict I's and O's -Stop lasix -SCDs for DVT prophylaxis -AM CBC and BMP -PT OT  Emphysema of lung (Nicole Perez) Upon presentation in the ER, was treated as a COPD exacerbation and received Nebulizer treatments and steroids. Aats currently 97% on RA -Duobnebs Q4H x 3 doses - Prednisone '40mg'$  x 5 days - Supplemental O2 as needed - Continuous O2 monitoring -Goal O2 88-92%  Paroxysmal atrial fibrillation (H[facility]) EKG: A fib. Patient not on anticoagulation due to recent symptomatic anemia with acute bleed.  - Continuous cardiac monitoring - Increase to metoprolol '50mg'$  daily   Chronic pain syndrome Has controlled prescriptions for chronic back pain.  - Continue Nocro 10-'325mg'$  q6h PRN for pain -Lower gabapentin dose to 100 mg 3 times daily  Essential hypertension, benign Blood  pressure elevated to 156/92 on admission. Patient recently discharged from hospital and home HCTZ was discontinued at that time. Will likely need lasix as an outpatient. - Vitals per routine     FEN/GI: Regular PPx: SCDs, previous bleed risk Dispo:PT home health  Subjective:  NAEO. Patient states she is ready to go home. No new chest pain, shortness or breath. Nausea or vomiting.   Objective: Temp:  [97.6 F (36.4 C)-98.6 F (37 C)] 98.5 F (36.9 C) (07/28 1147) Pulse Rate:  [81-121] 81 (07/28 1147) Resp:  [18-22] 19 (07/28 1147) BP: (129-157)/(66-89) 157/85 (07/28 1147) SpO2:  [94 %-97 %] 97 % (07/28 1147) Weight:  [71.2 kg] 71.2 kg (07/27 1245) Physical Exam: General: NAD, lying comfortably in hospital bed Cardiovascular: Irrregularly irregular, no murmurs, no peripheral edema Respiratory: normal WOB on RA, CTAB, mild expiratory wheezes b/l Abdomen: soft, NTTP, no rebound or guarding Extremities: Moving all 4 extremities equally   Laboratory: Most recent CBC Lab Results  Component Value Date   WBC 5.8 07/13/2022   HGB 8.0 (L) 07/13/2022   HCT 25.0 (L) 07/13/2022   MCV 89.0 07/13/2022   PLT 216 07/13/2022   Most recent BMP    Latest Ref Rng & Units 07/13/2022    2:23 AM  BMP  Glucose 70 - 99 mg/dL 141   BUN 8 - 23 mg/dL 28   Creatinine 0.44 - 1.00 mg/dL 1.26   Sodium 135 - 145 mmol/L 139   Potassium 3.5 - 5.1 mmol/L 4.0   Chloride 98 - 111 mmol/L 105   CO2 22 - 32 mmol/L 23   Calcium 8.9 - 10.3 mg/dL 8.8     Other  pertinent labs: none   Imaging/Diagnostic Tests: No new imaging. Nicole Oxford, MD 07/13/2022, 12:23 PM  PGY-1, [place] Intern pager: [phone], text pages welcome Secure chat group [place]

## 2022-07-13 NOTE — Care Management Obs Status (Signed)
[place] NOTIFICATION   Patient Details  Name: Nicole Perez MRN: [id number] Date of Birth: 07-02-1933   Medicare Observation Status Notification Given:  Yes    Angelita Ingles, RN 07/13/2022, 12:57 PM

## 2022-07-13 NOTE — Hospital Course (Addendum)
Nicole Perez. Boan is 88 y.o. female who presented with shortness of breath. Pertinent PMH/PSH includes Right leg DVT, Hx of lower GI bleed, emphysema, chronic pain, severe spinal stenosis, HTN, aortic aneurysm, polyneuropathy, neurogenic bladder. Patient admitted to [facility] for CHF exacerbation. Please see problem based Hospital Course below:  Hypoxia  HFpEF exacerbation In the ED, patient presented with shortness of breath, and ambulatory pulse ox desaturated to the 70s. Vitals were stable in the ED, BNP was elevated to 360.5, patient demonstrated peripheral edema.  Patient appeared to respond well to 1 dose IV Lasix 20 mg.  Recent echo from a couple weeks ago demonstrated EF of 60-65% without gross abnormalities.  Patient has poorly documented I/Os but was still net negative 43m throughout hospital admission, and Cr increased to 1.26 on day of discharge. Patient's symptoms resolved with diuresis. Patient was discharged on '20mg'$  oral Lasix as needed with instructions to use for swelling or increased weight.   Emphysema  In the ED, patient was treated for COPD exacerbation in addition to concomitant acute heart failure. Patient had some increased coughing. She was given Duonebs x1 in the ED. She was administered 1 dose IV methylprednisolone and the transition to oral prednisolone for a 5 day course. Patient comfortable on room air with both ambulation and rest.  She was discharged with 2 days of prednisone course left and home inhaler regimen. Patient shortness of breath resolved prior to discharge.  Paroxysmal Atrial Fibrillation Patient is not anticoagulation due to recent symptomatic anemia with acute GI bleed. Patient was noted to be in asymptomatic rate controlled A-fib on admission. Patient was continued on home metoprolol dose. Patient had noted heart to 160s with ambulation for which we increased metoprolol to '50mg'$  daily.  She tolerated this well and was discharged on  metoprolol 50 mg.   Issues for PCP Follow-Up Given concern for new heart failure, started patient on Lasix 20 prn. PCP to assess continued need for this. Recheck BMP on follow up to assess potassium.  HTN - recheck and consider adding back Losartan and/or HCTZ (both of which held during admission)  Anemia - Recheck CBC at follow up A. Fib - PCP to assess response and tolerance to increased metoprolol dose of 50 mg.  COPD - recommend PFTs as outpatient to evaluate COPD severity. Provided script for PRN albuterol inhaler on discharge.

## 2022-07-13 NOTE — Discharge Instructions (Addendum)
Dear Nicole Perez,  Thank you for letting us participate in your care. You were hospitalized for a heart failure exacerbation. Your heart scan (echocardiogram) looked overall re-assuring, but you exhibited signs and symptoms of heart failure. You were treated with a diuretic ("water pill"), to help you urinate extra fluid off of your body.  This improves your symptoms.  By the day of discharge, you do not need any extra oxygen from the nasal tubing.  POST-HOSPITAL & CARE INSTRUCTIONS New medication: '20mg'$  Oral of Lasix as needed if you have increased swelling in your legs or weight increases over short period of time. We increased your dose of Metoprolol to '50mg'$  daily, please continue taking this.  Follow-up with primary care provider. Appointment details below.   DOCTOR'S APPOINTMENT   Future Appointments  Date Time Provider [place]  07/18/2022 11:25 AM Erskine Emery, MD [facility]  08/01/2022  3:00 PM [facility] HS VASC 4 - SS [facility]  08/01/2022  3:30 PM [facility]-CV HS VASC 4 - SS [facility]  08/01/2022  4:00 PM Waynetta Sandy, MD [facility]  08/02/2022 11:00 AM McDiarmid, Blane Ohara, MD [facility] [place]    Follow-up Information     Erskine Emery, MD. Go on 07/18/2022.   Specialty: Family Medicine Why: At 11:25 am. Please arrive by 11:10 am. This is your hospital follow up appointment with the family medicine clinic. You will see Dr. Zigmund Daniel. She will collect blood work on you. If this day and time does not work well for you, please call the clinic directly to reschedule. Contact information: [place] [address] [phone]         McDiarmid, Blane Ohara, MD. Go on 08/02/2022.   Specialty: Family Medicine Why: At 11 am. Please arrive by 10:45 am. This is an appointment with your PCP, Dr. McDiarmid, to check in on your blood pressure and how you are doing with the as-needed lasix. He may do blood work at this visit. If you need to reschedule, please call the  clinic directly. Contact information: [place] [address] [phone]               Take care and be well!  [facility]  [place], [place] [address] [phone]

## 2022-07-13 NOTE — TOC Progression Note (Addendum)
Transition of Care [facility]) - Progression Note    Patient Details  Name: Nicole Perez MRN: [id number] Date of Birth: December 24, 1932  Transition of Care [facility] CM/SW [place], RN Phone Number:[phone]  07/13/2022, 11:22 AM  Clinical Narrative:    CM at bedside to offer choice for home health services. Daughter at bedside and acknowledges that patient was set up with [facility] for home health on previous discharge. Daughter states that she  or the patient  may not have answered for Home health agency if they did not know the number. Daughter is open to having [facility] for home health again. CM will reach out to [facility] for referral.   [place] referral has been sent to [facility] with [place]. Acceptance pending.  [place] referral accepted by [facility]. MD made aware that the agency can not see her before Tuesday/ Wednesday next week. No other needs noted. AVS has been updated        Expected Discharge Plan and Services                                                 Social Determinants of Health (SDOH) Interventions    Readmission Risk Interventions     No data to display

## 2022-07-13 NOTE — Discharge Summary (Signed)
[facility] Discharge Summary  Patient name: Nicole Perez Medical record number: [id number] Date of birth: [birth year] Age: 88 y.o. Gender: female Date of Admission: 07/11/2022  Date of Discharge: 07/13/22 Admitting Physician: Kinnie Feil, MD  Primary Care Provider: McDiarmid, Blane Ohara, MD Consultants: none  Indication for Hospitalization: shortness of breath  Brief Hospital Course:  Nicole Laine. Perez is 88 y.o. female who presented with shortness of breath. Pertinent PMH/PSH includes Right leg DVT, Hx of lower GI bleed, emphysema, chronic pain, severe spinal stenosis, HTN, aortic aneurysm, polyneuropathy, neurogenic bladder. Patient admitted to [facility] for CHF exacerbation. Please see problem based Hospital Course below:  Hypoxia  HFpEF exacerbation In the ED, patient presented with shortness of breath, and ambulatory pulse ox desaturated to the 70s. Vitals were stable in the ED, BNP was elevated to 360.5, patient demonstrated peripheral edema.  Patient appeared to respond well to 1 dose IV Lasix 20 mg.  Recent echo from a couple weeks ago demonstrated EF of 60-65% without gross abnormalities.  Patient has poorly documented I/Os but was still net negative 472m throughout hospital admission, and Cr increased to 1.26 on day of discharge. Patient's symptoms resolved with diuresis. Patient was discharged on '20mg'$  oral Lasix as needed with instructions to use for swelling or increased weight.   Emphysema  In the ED, patient was treated for COPD exacerbation in addition to concomitant acute heart failure. Patient had some increased coughing. She was given Duonebs x1 in the ED. She was administered 1 dose IV methylprednisolone and the transition to oral prednisolone for a 5 day course. Patient comfortable on room air with both ambulation and rest.  She was discharged with 2 days of prednisone course left and home inhaler regimen. Patient shortness  of breath resolved prior to discharge.  Paroxysmal Atrial Fibrillation Patient is not anticoagulation due to recent symptomatic anemia with acute GI bleed. Patient was noted to be in asymptomatic rate controlled A-fib on admission. Patient was continued on home metoprolol dose. Patient had noted heart to 160s with ambulation for which we increased metoprolol to '50mg'$  daily.  She tolerated this well and was discharged on metoprolol 50 mg.   Issues for PCP Follow-Up Given concern for new heart failure, started patient on Lasix 20 prn. PCP to assess continued need for this. Recheck BMP on follow up to assess potassium.  HTN - recheck and consider adding back Losartan and/or HCTZ (both of which held during admission)  Anemia - Recheck CBC at follow up A. Fib - PCP to assess response and tolerance to increased metoprolol dose of 50 mg.  COPD - recommend PFTs as outpatient to evaluate COPD severity. Provided script for PRN albuterol inhaler on discharge.   Disposition: home with home health  Discharge Condition: stable  Discharge Exam:  Vitals:   07/13/22 0743 07/13/22 1147  BP: (!) 152/76 (!) 157/85  Pulse: 85 81  Resp: 19 19  Temp: 97.6 F (36.4 C) 98.5 F (36.9 C)  SpO2: 96% 97%   Physical Exam: General: NAD, lying comfortably in hospital bed Cardiovascular: Irrregularly irregular, no murmurs, no peripheral edema Respiratory: normal WOB on RA, CTAB, mild expiratory wheezes b/l Abdomen: soft, NTTP, no rebound or guarding Extremities: Moving all 4 extremities equally  Significant Procedures: none  Significant Labs and Imaging:  Recent Labs  Lab 07/11/22 2114 07/12/22 0508 07/13/22 0223  WBC 4.5 5.3 5.8  HGB 9.2* 9.5* 8.0*  HCT 29.3* 29.3* 25.0*  PLT  236 235 216   Recent Labs  Lab 07/11/22 2114 07/12/22 0508 07/13/22 0223  NA 139 138 139  K 4.0 3.9 4.0  CL 108 106 105  CO2 22 21* 23  GLUCOSE 111* 154* 141*  BUN 21 22 28*  CREATININE 1.11* 0.99 1.26*  CALCIUM 9.1  9.3 8.8*    Pertinent Imaging  CT Angio Chest PE W and/or Wo Contrast IMPRESSION: No evidence of pulmonary embolism.   4.2 cm descending thoracic aortic aneurysm, unchanged.   Improving nodular opacities in the medial right upper lobe and left lower lobe, likely infectious/inflammatory. Given short interval improvement, no follow-up is recommended per Fleischner Society guidelines.   Small bilateral pleural effusions, right greater than left, new.   Results/Tests Pending at Time of Discharge: none  Discharge Medications:  Allergies as of 07/13/2022       Reactions   Ciprofloxacin Other (See Comments)   Fluoroquinolones associated with increase risk of aortic aneurysm reupture   Nitrofurantoin Other (See Comments)   Malaise and profound fatigue   Keflex [cephalexin] Nausea Only        Medication List     TAKE these medications    albuterol 108 (90 Base) MCG/ACT inhaler Commonly known as: VENTOLIN HFA Inhale 2 puffs into the lungs every 6 (six) hours as needed for wheezing or shortness of breath.   bisacodyl 5 MG EC tablet Generic drug: bisacodyl Take 2 tablets (10 mg total) by mouth daily. What changed:  when to take this reasons to take this   ferrous sulfate 324 (65 Fe) MG Tbec Take 1 tablet (325 mg total) by mouth daily with breakfast.   furosemide 20 MG tablet Commonly known as: Lasix Take 1 tablet (20 mg total) by mouth daily as needed. Take if your weight increases by 3 lbs in 24 hours or 5 lbs in one week. Or if you develop worsening leg swelling. Call clinic if you need to use.   gabapentin 300 MG capsule Commonly known as: NEURONTIN Take 1 capsule (300 mg total) by mouth 3 (three) times daily. What changed:  when to take this reasons to take this   HYDROcodone-acetaminophen 10-325 MG tablet Commonly known as: NORCO Take 1 tablet by mouth every 6 (six) hours as needed for moderate pain.   metoprolol succinate 50 MG 24 hr tablet Commonly known  as: TOPROL-XL Take 1 tablet (50 mg total) by mouth daily. Take with or immediately following a meal. Start taking on: July 14, 2022 What changed:  medication strength how much to take additional instructions   pantoprazole 40 MG tablet Commonly known as: PROTONIX Take 1 tablet (40 mg total) by mouth daily at 6 (six) AM.   predniSONE 20 MG tablet Commonly known as: DELTASONE Take 2 tablets (40 mg total) by mouth daily with breakfast. Start taking on: July 14, 2022   solifenacin 5 MG tablet Commonly known as: VESICARE Take 5 mg by mouth daily as needed (overactive bladder).        Discharge Instructions: Please refer to Patient Instructions section of EMR for full details.  Patient was counseled important signs and symptoms that should prompt return to medical care, changes in medications, dietary instructions, activity restrictions, and follow up appointments.   Follow-Up Appointments:  Follow-up Information     Erskine Emery, MD. Go on 07/18/2022.   Specialty: Family Medicine Why: At 11:25 am. Please arrive by 11:10 am. This is your hospital follow up appointment with the family medicine clinic. You will see  Dr. Zigmund Daniel. She will collect blood work on you. If this day and time does not work well for you, please call the clinic directly to reschedule. Contact information: [place] [address] [phone]         McDiarmid, Blane Ohara, MD. Go on 08/02/2022.   Specialty: Family Medicine Why: At 11 am. Please arrive by 10:45 am. This is an appointment with your PCP, Dr. McDiarmid, to check in on your blood pressure and how you are doing with the as-needed lasix. He may do blood work at this visit. If you need to reschedule, please call the clinic directly. Contact information: [place] [address] [place], [facility] Follow up.   Specialty: Home Health Services Why: Your home health has been sey up with  [facility]. The office will call you with start of service information. If you have any questions please call the number listed above. Contact information: [address] [place] [address] [phone]                 Salvadore Oxford, MD 07/13/2022, 12:54 PM PGY-1, [facility]  Upper Level Addendum: I have reviewed the above note, making necessary revisions as appropriate. I agree with the medical decision making and physical exam as noted above. Ezequiel Essex, MD PGY-3 [facility]

## 2022-07-13 NOTE — Progress Notes (Signed)
Mobility Specialist Progress Note:   07/13/22 1052  Mobility  Activity Ambulated with assistance in room  Level of Assistance Standby assist, set-up cues, supervision of patient - no hands on  Assistive Device Front wheel walker  Distance Ambulated (ft) 30 ft  Activity Response Tolerated well  $Mobility charge 1 Mobility   Pt received EOB with bed alarm going off. No complaints of pain. Left in chair with call bell in reach and all needs met.   [facility] Vaishali Baise Mobility Specialist

## 2022-07-13 NOTE — Progress Notes (Signed)
Physical Therapy Treatment Patient Details Name: Nicole Perez MRN: [id number] DOB: 06-Oct-1933 Today's Date: 07/13/2022   History of Present Illness pt is and 88 y/o female admitted 7/26 for SOB worsening over the past few days in conjunction with bil LE swelling.  Work up included treatment for COPD exacerbation and paraxysmal Afib.  PMH includes HTN, CKD 3, DVT, aortic aneurysm, chronic pain.    PT Comments    Pt tolerates treatment well, ambulating for increased distances on room air. Pt O2 sats not reading reliably during ambulation likely due to grip on walker, however sats read reliably soon after releasing walker and are in 90s. PT provides education on the need for energy conservation and pursed lip breathing in an effort to improve activity tolerance. PT recommends HHPT in an effort to progress activity tolerance and to restore the patient's PLOF. Pt will benefit from attempts at ambulation with rollator in further therapy sessions.  Recommendations for follow up therapy are one component of a multi-disciplinary discharge planning process, led by the attending physician.  Recommendations may be updated based on patient status, additional functional criteria and insurance authorization.  Follow Up Recommendations  Home health PT     Assistance Recommended at Discharge Frequent or constant Supervision/Assistance  Patient can return home with the following A little help with walking and/or transfers;A little help with bathing/dressing/bathroom;Assistance with cooking/housework;Assist for transportation;Help with stairs or ramp for entrance   Equipment Recommendations  None recommended by PT    Recommendations for Other Services       Precautions / Restrictions Precautions Precautions: Fall Precaution Comments: urine incontinence (wears Depends at home) Restrictions Weight Bearing Restrictions: No     Mobility  Bed Mobility                    Transfers Overall  transfer level: Needs assistance Equipment used: Rolling walker (2 wheels) Transfers: Sit to/from Stand Sit to Stand: Supervision                Ambulation/Gait Ambulation/Gait assistance: Min guard (for patient comfort due to fear of falling) Gait Distance (Feet): 150 Feet Assistive device: Rolling walker (2 wheels) Gait Pattern/deviations: Step-through pattern Gait velocity: reduced Gait velocity interpretation: <1.31 ft/sec, indicative of household ambulator   General Gait Details: pt with slowed step-through gait, reduced stride length   Stairs             Wheelchair Mobility    Modified Rankin (Stroke Patients Only)       Balance Overall balance assessment: Needs assistance Sitting-balance support: No upper extremity supported, Feet supported Sitting balance-Leahy Scale: Good     Standing balance support: Single extremity supported, Bilateral upper extremity supported, Reliant on assistive device for balance Standing balance-Leahy Scale: Poor                              Cognition Arousal/Alertness: Awake/alert Behavior During Therapy: WFL for tasks assessed/performed Overall Cognitive Status: Within Functional Limits for tasks assessed                                          Exercises      General Comments General comments (skin integrity, edema, etc.): tachycardia up to 160 when mobilizing, recovers quickly to 120s when seated and resting. Pt sats with unreliable reading when ambulating however recovers  within 10 seconds of seated rest break and without RW to 92%. Pt denies SOB when ambulating, although does still present with wheezing with deep breathing.      Pertinent Vitals/Pain Pain Assessment Pain Assessment: No/denies pain    Home Living                          Prior Function            PT Goals (current goals can now be found in the care plan section) Acute Rehab PT Goals Patient Stated  Goal: return home PT Goal Formulation: With patient/family Time For Goal Achievement: 07/27/22 Potential to Achieve Goals: Good Progress towards PT goals: Progressing toward goals    Frequency    Min 3X/week      PT Plan Current plan remains appropriate    Co-evaluation              AM-PAC PT "6 Clicks" Mobility   Outcome Measure  Help needed turning from your back to your side while in a flat bed without using bedrails?: A Little Help needed moving from lying on your back to sitting on the side of a flat bed without using bedrails?: A Little Help needed moving to and from a bed to a chair (including a wheelchair)?: A Little Help needed standing up from a chair using your arms (e.g., wheelchair or bedside chair)?: A Little Help needed to walk in hospital room?: A Little Help needed climbing 3-5 steps with a railing? : A Little 6 Click Score: 18    End of Session   Activity Tolerance: Patient tolerated treatment well Patient left: in chair;with call bell/phone within reach;with family/visitor present Nurse Communication: Mobility status PT Visit Diagnosis: Other abnormalities of gait and mobility (R26.89);Muscle weakness (generalized) (M62.81)     Time: 3016-0109 PT Time Calculation (min) (ACUTE ONLY): 28 min  Charges:  $Gait Training: 8-22 mins $Self Care/Home Management: [place], PT, DPT Acute Rehabilitation Office [place] Tilia Faso 07/13/2022, 11:51 AM

## 2022-07-18 ENCOUNTER — Encounter: Payer: Self-pay | Admitting: Student

## 2022-07-18 ENCOUNTER — Ambulatory Visit (INDEPENDENT_AMBULATORY_CARE_PROVIDER_SITE_OTHER): Payer: Medicare Other | Admitting: Student

## 2022-07-18 VITALS — BP 138/91 | HR 79 | Wt 154.4 lb

## 2022-07-18 DIAGNOSIS — D649 Anemia, unspecified: Secondary | ICD-10-CM | POA: Diagnosis not present

## 2022-07-18 DIAGNOSIS — Z9181 History of falling: Secondary | ICD-10-CM

## 2022-07-18 DIAGNOSIS — I5033 Acute on chronic diastolic (congestive) heart failure: Secondary | ICD-10-CM

## 2022-07-18 DIAGNOSIS — I480 Paroxysmal atrial fibrillation: Secondary | ICD-10-CM | POA: Diagnosis not present

## 2022-07-18 DIAGNOSIS — I10 Essential (primary) hypertension: Secondary | ICD-10-CM

## 2022-07-18 DIAGNOSIS — J449 Chronic obstructive pulmonary disease, unspecified: Secondary | ICD-10-CM

## 2022-07-18 NOTE — Assessment & Plan Note (Signed)
Found to have anemia during hospitalization Recheck CBC today

## 2022-07-18 NOTE — Assessment & Plan Note (Signed)
Discussed with the patient to only take her Lasix medication if she notices volume overloaded Patient is euvolemic on exam today Checking kidney function with BMP today

## 2022-07-18 NOTE — Patient Instructions (Signed)
It was great to see you today! Thank you for choosing [facility] for your primary care. Nicole Perez was seen for hospital follow up.  Today we addressed: Continuing with lung function testing to evaluate the severity of your COPD Continue with Lasix 20 mg when you notice swelling in the legs  We will keep you on the medications your are on, finish the steroid until you are done with the medication  I will call you with the results of your labs Continue with metoprolol 50 mg Use compression stocking and elevate your legs  I placed another order to the physical therapist  I have also scheduled a visit with Dr. Valentina Lucks to assess your lung function   If you haven't already, sign up for My Chart to have easy access to your labs results, and communication with your primary care physician.  We are checking some labs today. If they are abnormal, I will call you. If they are normal, I will send you a MyChart message (if it is active) or a letter in the mail. If you do not hear about your labs in the next 2 weeks, please call the office.   You should return to our clinic Return for With Dr. Wendy Poet .  I recommend that you always bring your medications to each appointment as this makes it easy to ensure you are on the correct medications and helps [place] not miss refills when you need them.  Please arrive 15 minutes before your appointment to ensure smooth check in process.  We appreciate your efforts in making this happen.  Please call the clinic at [phone] if your symptoms worsen or you have any concerns.  Thank you for allowing me to participate in your care, Erskine Emery, MD 07/18/2022, 12:02 PM PGY-2, [place]

## 2022-07-18 NOTE — Assessment & Plan Note (Addendum)
Irregular today but rate controlled, tolerating metoprolol. Continue current dosage of 50 mg

## 2022-07-18 NOTE — Progress Notes (Signed)
SUBJECTIVE:   CHIEF COMPLAINT / HPI:   Hospital Follow up:  Nicole Laine. Remillard is 88 y.o. female who presented with shortness of breath to [facility]. Pertinent PMH/PSH includes Right leg DVT, Hx of lower GI bleed, emphysema, chronic pain, severe spinal stenosis, HTN, aortic aneurysm, polyneuropathy, neurogenic bladder. Patient admitted to [place] for CHF exacerbation and was discharged 07/13/22.   During hospitalization the patient had hypoxemia into the 70s via pulse ox and an elevated BNP which prompted an echocardiogram review from a couple weeks ago that showed EF of 60 to 65% without gross abnormalities.  She was started on 20 mg of oral Lasix for volume overload.  Patient was concomitantly treated for COPD exacerbation in the emergency room in addition to acute heart failure.  She was given DuoNebs, IV Methylpred, was transitioned to oral prednisone.  She was discharged with an albuterol inhaler to use as needed.  Paroxysmal A-fib without anticoagulation due to recent symptomatic anemia with acute GI bleed.  Increased metoprolol dose while in the hospital to 50 mg daily.  Since then, has been feeling better overall  Has been taking her prednisone and metoprolol as instructed Only needed her albuterol yesterday one time with good benefit Still intermittently dizzy, relates this to the prednisone  Ambulates with walker  No evidence of a bleed anywhere    Issues for PCP Follow-Up Given concern for new heart failure, started patient on Lasix 20 prn. PCP to assess continued need for this. Recheck BMP on follow up to assess potassium.  HTN - recheck and consider adding back Losartan and/or HCTZ (both of which held during admission)  Anemia - Recheck CBC at follow up A. Fib - PCP to assess response and tolerance to increased metoprolol dose of 50 mg.  COPD - recommend PFTs as outpatient to evaluate COPD severity. Provided script for PRN albuterol inhaler on  discharge.   PERTINENT  PMH / PSH:    Hypertension, aortic aneurysm, polyneuropathy, neurogenic bladder, history of right leg DVT and lower GI bleed, emphysema, chronic pain, severe spinal stenosis    OBJECTIVE:  BP (!) 138/91   Pulse 79   Wt 154 lb 6.4 oz (70 kg)   LMP 12/17/1962   SpO2 97%   BMI 31.19 kg/m   General: NAD, pleasant, able to participate in exam; sitting in wheelchair with daughter in the room  Cardiac: regular rate, irregular rhythm, no murmurs auscultated Respiratory: CTAB, normal WOB, no wheeze or focal diminishment  Abdomen: soft, non-tender, non-distended, normoactive bowel sounds Extremities: warm and well perfused, no edema or cyanosis Skin: warm and dry, no rashes noted Neuro: alert, no obvious focal deficits, speech normal Psych: Normal affect and mood  ASSESSMENT/PLAN:  Essential hypertension, benign Continue to hold losartan and HCTZ, repeat blood pressure today normal Check BMP today We will recheck blood pressure at next visit Continue metoprolol 50 mg   CHF exacerbation (HCC) Discussed with the patient to only take her Lasix medication if she notices volume overloaded Patient is euvolemic on exam today Checking kidney function with BMP today  Chronic obstructive pulmonary disease ([place]) When patient presented with hypoxemia, she was treated concomitantly for COPD exacerbation and CHF.  Completed steroid burst, has albuterol inhaler as needed. Continue with formal PFTs with Dr. Valentina Lucks, appointment scheduled at the end of August Breathing well on exam today without significant wheeze  Anemia Found to have anemia during hospitalization Recheck CBC today  Paroxysmal atrial fibrillation (HCC) Irregular today but  rate controlled, tolerating metoprolol. Continue current dosage of 50 mg    Reordered HHPT for patient today.    Orders Placed This Encounter  Procedures   Basic Metabolic Panel   CBC   Home Health    Order Specific Question:    To provide the following care/treatments    Answer:   PT   Face-to-face encounter (required for Medicare/Medicaid patients)    I Carnel Stegman certify that this patient is under my care and that I, or a nurse practitioner or physician's assistant working with me, had a face-to-face encounter that meets the physician face-to-face encounter requirements with this patient on 07/18/2022. The encounter with the patient was in whole, or in part for the following medical condition(s) which is the primary reason for home health care (List medical condition): imbalance, CHF, deconditioning    Order Specific Question:   The encounter with the patient was in whole, or in part, for the following medical condition, which is the primary reason for home health care    Answer:   imbalance, CHF, deconditioning    Order Specific Question:   I certify that, based on my findings, the following services are medically necessary home health services    Answer:   Physical therapy    Order Specific Question:   Reason for Medically Necessary Home Health Services    Answer:   Therapy- Instruction on Safe use of Assistive Devices for ADLs    Order Specific Question:   Reason for Medically Necessary Home Health Services    Answer:   Therapy- Personnel officer, Adult nurse Specific Question:   My clinical findings support the need for the above services    Answer:   Unable to leave home safely without assistance and/or assistive device    Order Specific Question:   My clinical findings support the need for the above services    Answer:   Unsafe ambulation due to balance issues    Order Specific Question:   Further, I certify that my clinical findings support that this patient is homebound due to:    Answer:   Ambulates short distances less than 300 feet    Order Specific Question:   Further, I certify that my clinical findings support that this patient is homebound due to:    Answer:   Unsafe  ambulation due to balance issues   No orders of the defined types were placed in this encounter.  Return for With Dr. Wendy Poet . Erskine Emery, MD 07/18/2022, 12:15 PM PGY-2, [place]

## 2022-07-18 NOTE — Assessment & Plan Note (Addendum)
Continue to hold losartan and HCTZ, repeat blood pressure today normal Check BMP today We will recheck blood pressure at next visit Continue metoprolol 50 mg

## 2022-07-18 NOTE — Assessment & Plan Note (Signed)
When patient presented with hypoxemia, she was treated concomitantly for COPD exacerbation and CHF.  Completed steroid burst, has albuterol inhaler as needed. Continue with formal PFTs with Dr. Valentina Lucks, appointment scheduled at the end of August Breathing well on exam today without significant wheeze

## 2022-07-19 LAB — CBC
Hematocrit: 32.4 % — ABNORMAL LOW (ref 34.0–46.6)
Hemoglobin: 10.3 g/dL — ABNORMAL LOW (ref 11.1–15.9)
MCH: 28.1 pg (ref 26.6–33.0)
MCHC: 31.8 g/dL (ref 31.5–35.7)
MCV: 89 fL (ref 79–97)
Platelets: 246 10*3/uL (ref 150–450)
RBC: 3.66 x10E6/uL — ABNORMAL LOW (ref 3.77–5.28)
RDW: 14.1 % (ref 11.7–15.4)
WBC: 7.5 10*3/uL (ref 3.4–10.8)

## 2022-07-19 LAB — BASIC METABOLIC PANEL
BUN/Creatinine Ratio: 21 (ref 12–28)
BUN: 27 mg/dL (ref 8–27)
CO2: 23 mmol/L (ref 20–29)
Calcium: 9.2 mg/dL (ref 8.7–10.3)
Chloride: 103 mmol/L (ref 96–106)
Creatinine, Ser: 1.28 mg/dL — ABNORMAL HIGH (ref 0.57–1.00)
Glucose: 128 mg/dL — ABNORMAL HIGH (ref 70–99)
Potassium: 4.5 mmol/L (ref 3.5–5.2)
Sodium: 143 mmol/L (ref 134–144)
eGFR: 41 mL/min/{1.73_m2} — ABNORMAL LOW (ref >59)

## 2022-07-24 ENCOUNTER — Other Ambulatory Visit: Payer: Self-pay

## 2022-07-24 DIAGNOSIS — J439 Emphysema, unspecified: Secondary | ICD-10-CM | POA: Diagnosis not present

## 2022-07-24 DIAGNOSIS — K5903 Drug induced constipation: Secondary | ICD-10-CM

## 2022-07-24 DIAGNOSIS — D649 Anemia, unspecified: Secondary | ICD-10-CM | POA: Diagnosis not present

## 2022-07-24 DIAGNOSIS — I110 Hypertensive heart disease with heart failure: Secondary | ICD-10-CM | POA: Diagnosis not present

## 2022-07-24 DIAGNOSIS — M4800 Spinal stenosis, site unspecified: Secondary | ICD-10-CM | POA: Diagnosis not present

## 2022-07-24 DIAGNOSIS — I5031 Acute diastolic (congestive) heart failure: Secondary | ICD-10-CM | POA: Diagnosis not present

## 2022-07-24 NOTE — Telephone Encounter (Signed)
Sree calling for PT verbal orders as follows:  1 time(s) weekly for 5 week(s)  Also requesting skilled nursing evaluation.   Verbal orders given per [facility] protocol   Patient is also needing refills on bisacodyl and vesicare. See pended medication refills.   Talbot Grumbling, RN

## 2022-07-25 DIAGNOSIS — I110 Hypertensive heart disease with heart failure: Secondary | ICD-10-CM | POA: Diagnosis not present

## 2022-07-25 DIAGNOSIS — D649 Anemia, unspecified: Secondary | ICD-10-CM | POA: Diagnosis not present

## 2022-07-25 DIAGNOSIS — J439 Emphysema, unspecified: Secondary | ICD-10-CM | POA: Diagnosis not present

## 2022-07-25 DIAGNOSIS — I5031 Acute diastolic (congestive) heart failure: Secondary | ICD-10-CM | POA: Diagnosis not present

## 2022-07-25 DIAGNOSIS — M4800 Spinal stenosis, site unspecified: Secondary | ICD-10-CM | POA: Diagnosis not present

## 2022-07-25 MED ORDER — SOLIFENACIN SUCCINATE 5 MG PO TABS: 5.0000 mg | ORAL_TABLET | Freq: Every day | ORAL | 2 refills | Status: DC | PRN

## 2022-07-25 MED ORDER — BISACODYL 5 MG PO TBEC: 10.0000 mg | DELAYED_RELEASE_TABLET | Freq: Every day | ORAL | 3 refills | Status: DC

## 2022-07-26 ENCOUNTER — Other Ambulatory Visit: Payer: Self-pay

## 2022-07-26 DIAGNOSIS — M4715 Other spondylosis with myelopathy, thoracolumbar region: Secondary | ICD-10-CM

## 2022-07-26 DIAGNOSIS — M160 Bilateral primary osteoarthritis of hip: Secondary | ICD-10-CM

## 2022-07-26 DIAGNOSIS — M47812 Spondylosis without myelopathy or radiculopathy, cervical region: Secondary | ICD-10-CM

## 2022-07-26 DIAGNOSIS — M48062 Spinal stenosis, lumbar region with neurogenic claudication: Secondary | ICD-10-CM

## 2022-07-26 DIAGNOSIS — G894 Chronic pain syndrome: Secondary | ICD-10-CM

## 2022-07-26 DIAGNOSIS — F1190 Opioid use, unspecified, uncomplicated: Secondary | ICD-10-CM

## 2022-07-26 DIAGNOSIS — G8929 Other chronic pain: Secondary | ICD-10-CM

## 2022-07-26 DIAGNOSIS — M4804 Spinal stenosis, thoracic region: Secondary | ICD-10-CM

## 2022-07-26 DIAGNOSIS — M170 Bilateral primary osteoarthritis of knee: Secondary | ICD-10-CM

## 2022-07-26 DIAGNOSIS — M159 Polyosteoarthritis, unspecified: Secondary | ICD-10-CM

## 2022-07-26 DIAGNOSIS — G959 Disease of spinal cord, unspecified: Secondary | ICD-10-CM

## 2022-07-26 DIAGNOSIS — M461 Sacroiliitis, not elsewhere classified: Secondary | ICD-10-CM

## 2022-07-27 MED ORDER — HYDROCODONE-ACETAMINOPHEN 10-325 MG PO TABS
1.0000 | ORAL_TABLET | Freq: Four times a day (QID) | ORAL | 0 refills | Status: DC | PRN
Start: 2022-08-03 — End: 2022-08-02

## 2022-07-30 DIAGNOSIS — J439 Emphysema, unspecified: Secondary | ICD-10-CM | POA: Diagnosis not present

## 2022-07-30 DIAGNOSIS — I110 Hypertensive heart disease with heart failure: Secondary | ICD-10-CM | POA: Diagnosis not present

## 2022-07-30 DIAGNOSIS — I5031 Acute diastolic (congestive) heart failure: Secondary | ICD-10-CM | POA: Diagnosis not present

## 2022-07-30 DIAGNOSIS — M4800 Spinal stenosis, site unspecified: Secondary | ICD-10-CM | POA: Diagnosis not present

## 2022-07-30 DIAGNOSIS — D649 Anemia, unspecified: Secondary | ICD-10-CM | POA: Diagnosis not present

## 2022-08-01 ENCOUNTER — Ambulatory Visit (HOSPITAL_COMMUNITY)
Admission: RE | Admit: 2022-08-01 | Discharge: 2022-08-01 | Disposition: A | Discharge status: Home / Self Care | Payer: Medicare Other | Source: Ambulatory Visit | Attending: Vascular Surgery | Admitting: Vascular Surgery

## 2022-08-01 ENCOUNTER — Ambulatory Visit: Payer: Medicare Other | Admitting: Vascular Surgery

## 2022-08-01 ENCOUNTER — Telehealth: Payer: Self-pay

## 2022-08-01 ENCOUNTER — Encounter: Payer: Self-pay | Admitting: Vascular Surgery

## 2022-08-01 ENCOUNTER — Ambulatory Visit (INDEPENDENT_AMBULATORY_CARE_PROVIDER_SITE_OTHER)
Admission: RE | Admit: 2022-08-01 | Discharge: 2022-08-01 | Disposition: A | Discharge status: Home / Self Care | Payer: Medicare Other | Source: Ambulatory Visit | Attending: Vascular Surgery | Admitting: Vascular Surgery

## 2022-08-01 VITALS — BP 150/101 | HR 77 | Temp 97.9°F | Resp 20 | Ht 59.0 in | Wt 154.0 lb

## 2022-08-01 DIAGNOSIS — D649 Anemia, unspecified: Secondary | ICD-10-CM | POA: Diagnosis not present

## 2022-08-01 DIAGNOSIS — I110 Hypertensive heart disease with heart failure: Secondary | ICD-10-CM | POA: Diagnosis not present

## 2022-08-01 DIAGNOSIS — J439 Emphysema, unspecified: Secondary | ICD-10-CM | POA: Diagnosis not present

## 2022-08-01 DIAGNOSIS — I739 Peripheral vascular disease, unspecified: Secondary | ICD-10-CM

## 2022-08-01 DIAGNOSIS — I5031 Acute diastolic (congestive) heart failure: Secondary | ICD-10-CM | POA: Diagnosis not present

## 2022-08-01 DIAGNOSIS — M4800 Spinal stenosis, site unspecified: Secondary | ICD-10-CM | POA: Diagnosis not present
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
## 2022-08-01 NOTE — Telephone Encounter (Signed)
Sree-PT with [facility] LVM on nurse line to report elevated BP reading at today's visit. Reports BP of 170/100.   Attempted to return call to [facility]- no answer, LVM.   Called patient's daughter to discuss BP further. Patient denies chest pain, headaches, blurred vision or SHOB.   Patient to follow up with Dr. McDiarmid tomorrow at 38.  Talbot Grumbling, RN

## 2022-08-02 ENCOUNTER — Encounter: Payer: Self-pay | Admitting: Family Medicine

## 2022-08-02 ENCOUNTER — Ambulatory Visit (INDEPENDENT_AMBULATORY_CARE_PROVIDER_SITE_OTHER): Payer: Medicare Other | Admitting: Family Medicine

## 2022-08-02 VITALS — BP 148/86 | HR 66 | Ht 59.0 in | Wt 149.0 lb

## 2022-08-02 DIAGNOSIS — M160 Bilateral primary osteoarthritis of hip: Secondary | ICD-10-CM | POA: Diagnosis not present

## 2022-08-02 DIAGNOSIS — G959 Disease of spinal cord, unspecified: Secondary | ICD-10-CM | POA: Diagnosis not present

## 2022-08-02 DIAGNOSIS — D631 Anemia in chronic kidney disease: Secondary | ICD-10-CM

## 2022-08-02 DIAGNOSIS — Z79899 Other long term (current) drug therapy: Secondary | ICD-10-CM | POA: Diagnosis not present

## 2022-08-02 DIAGNOSIS — G894 Chronic pain syndrome: Secondary | ICD-10-CM | POA: Diagnosis not present

## 2022-08-02 DIAGNOSIS — M5441 Lumbago with sciatica, right side: Secondary | ICD-10-CM

## 2022-08-02 DIAGNOSIS — N1831 Chronic kidney disease, stage 3a: Secondary | ICD-10-CM | POA: Diagnosis not present

## 2022-08-02 DIAGNOSIS — I10 Essential (primary) hypertension: Secondary | ICD-10-CM | POA: Diagnosis not present

## 2022-08-02 DIAGNOSIS — M150 Primary generalized (osteo)arthritis: Secondary | ICD-10-CM

## 2022-08-02 DIAGNOSIS — M461 Sacroiliitis, not elsewhere classified: Secondary | ICD-10-CM

## 2022-08-02 DIAGNOSIS — J439 Emphysema, unspecified: Secondary | ICD-10-CM

## 2022-08-02 DIAGNOSIS — M47812 Spondylosis without myelopathy or radiculopathy, cervical region: Secondary | ICD-10-CM | POA: Diagnosis not present

## 2022-08-02 DIAGNOSIS — M170 Bilateral primary osteoarthritis of knee: Secondary | ICD-10-CM | POA: Diagnosis not present

## 2022-08-02 DIAGNOSIS — F1190 Opioid use, unspecified, uncomplicated: Secondary | ICD-10-CM

## 2022-08-02 DIAGNOSIS — I480 Paroxysmal atrial fibrillation: Secondary | ICD-10-CM

## 2022-08-02 DIAGNOSIS — M4804 Spinal stenosis, thoracic region: Secondary | ICD-10-CM

## 2022-08-02 DIAGNOSIS — M4715 Other spondylosis with myelopathy, thoracolumbar region: Secondary | ICD-10-CM

## 2022-08-02 DIAGNOSIS — I5032 Chronic diastolic (congestive) heart failure: Secondary | ICD-10-CM

## 2022-08-02 DIAGNOSIS — I739 Peripheral vascular disease, unspecified: Secondary | ICD-10-CM

## 2022-08-02 DIAGNOSIS — R918 Other nonspecific abnormal finding of lung field: Secondary | ICD-10-CM

## 2022-08-02 DIAGNOSIS — M159 Polyosteoarthritis, unspecified: Secondary | ICD-10-CM

## 2022-08-02 DIAGNOSIS — M48062 Spinal stenosis, lumbar region with neurogenic claudication: Secondary | ICD-10-CM | POA: Diagnosis not present

## 2022-08-02 DIAGNOSIS — G8929 Other chronic pain: Secondary | ICD-10-CM

## 2022-08-02 MED ORDER — HYDROCODONE-ACETAMINOPHEN 10-325 MG PO TABS: 1.0000 | ORAL_TABLET | Freq: Four times a day (QID) | ORAL | 0 refills | Status: AC | PRN

## 2022-08-02 MED ORDER — LOSARTAN POTASSIUM 25 MG PO TABS: 25.0000 mg | ORAL_TABLET | Freq: Every day | ORAL | 3 refills | Status: DC

## 2022-08-02 MED ORDER — METOPROLOL SUCCINATE ER 50 MG PO TB24: 50.0000 mg | ORAL_TABLET | Freq: Every day | ORAL | 3 refills | Status: DC

## 2022-08-02 NOTE — Patient Instructions (Addendum)
To improve your blood pressure control, please restart your Losartan 25 mg tablet, one tablet a day  Please continue your metoprolol XL 50 mg tablet, one tablet a day  Take your water pill, furosemide (Lasix) 20 mg tablet, one tablet as need for leg swellling or shortness of breath.   Dr Jahshua Bonito would like to see you again in a month to recheck your blood pressure.    We are checking your hemoglobin and kidneys today.

## 2022-08-03 ENCOUNTER — Encounter: Payer: Self-pay | Admitting: Family Medicine

## 2022-08-03 ENCOUNTER — Other Ambulatory Visit: Payer: Self-pay

## 2022-08-03 DIAGNOSIS — I5032 Chronic diastolic (congestive) heart failure: Secondary | ICD-10-CM | POA: Insufficient documentation

## 2022-08-03 DIAGNOSIS — I739 Peripheral vascular disease, unspecified: Secondary | ICD-10-CM

## 2022-08-03 LAB — BASIC METABOLIC PANEL
BUN/Creatinine Ratio: 25 (ref 12–28)
BUN: 27 mg/dL (ref 8–27)
CO2: 23 mmol/L (ref 20–29)
Calcium: 9.4 mg/dL (ref 8.7–10.3)
Chloride: 101 mmol/L (ref 96–106)
Creatinine, Ser: 1.09 mg/dL — ABNORMAL HIGH (ref 0.57–1.00)
Glucose: 105 mg/dL — ABNORMAL HIGH (ref 70–99)
Potassium: 4.1 mmol/L (ref 3.5–5.2)
Sodium: 141 mmol/L (ref 134–144)
eGFR: 49 mL/min/{1.73_m2} — ABNORMAL LOW (ref >59)

## 2022-08-03 LAB — CBC
Hematocrit: 32.4 % — ABNORMAL LOW (ref 34.0–46.6)
Hemoglobin: 10.5 g/dL — ABNORMAL LOW (ref 11.1–15.9)
MCH: 27.9 pg (ref 26.6–33.0)
MCHC: 32.4 g/dL (ref 31.5–35.7)
MCV: 86 fL (ref 79–97)
Platelets: 248 10*3/uL (ref 150–450)
RBC: 3.76 x10E6/uL — ABNORMAL LOW (ref 3.77–5.28)
RDW: 14.1 % (ref 11.7–15.4)
WBC: 4 10*3/uL (ref 3.4–10.8)

## 2022-08-03 NOTE — Assessment & Plan Note (Signed)
Repeat CT Chest 09/25/22 f/u Chest CTA pulm nodules infectious vs malignancy

## 2022-08-03 NOTE — Assessment & Plan Note (Signed)
Established problem  Recurrent issue with acute and chronic blood loss as presumed causes No longer on Apixaban CBC:    Component Value Date/Time   WBC 4.0 08/02/2022 1457   WBC 5.8 07/13/2022 0223   HGB 10.5 (L) 08/02/2022 1457   HCT 32.4 (L) 08/02/2022 1457   PLT 248 08/02/2022 1457   MCV 86 08/02/2022 1457   NEUTROABS 2.6 07/11/2022 2114   NEUTROABS 6.0 06/21/2022 1204   LYMPHSABS 1.3 07/11/2022 2114   LYMPHSABS 1.3 06/21/2022 1204   MONOABS 0.3 07/11/2022 2114   EOSABS 0.2 07/11/2022 2114   EOSABS 0.1 06/21/2022 1204   BASOSABS 0.0 07/11/2022 2114   BASOSABS 0.1 06/21/2022 1204    Stable condition.  Continue on daily iron tablet.

## 2022-08-03 NOTE — Progress Notes (Signed)
Nicole Perez is accompanied by daughter, Nicole Perez Sources of clinical information for visit is/are patient, relative(s), and past medical records. Nursing assessment for this office visit was reviewed with the patient for accuracy and revision.     Previous Report(s) Reviewed: ABI and visit with Nicole Perez (Vasc) yesterday, and hospital admission     08/02/2022   11:04 AM  Depression screen PHQ 2/9  Decreased Interest 1  Down, Depressed, Hopeless 0  PHQ - 2 Score 1  Altered sleeping 0  Tired, decreased energy 1  Change in appetite 0  Feeling bad or failure about yourself  0  Trouble concentrating 0  Moving slowly or fidgety/restless 0  Suicidal thoughts 0  PHQ-9 Score 2  Difficult doing work/chores Somewhat difficult   [place] Visit from 08/02/2022 in [place] Office Visit from 07/18/2022 in [place] Office Visit from 07/05/2022 in [place]  Thoughts that you would be better off dead, or of hurting yourself in some way Not at all Not at all Not at all  PHQ-9 Total Score 2 2 0          06/14/2022    3:52 PM 03/08/2022    9:41 AM 03/08/2022    9:33 AM 10/26/2021    3:32 PM 06/22/2021   11:03 AM  [place] in the past year? 0 0 0 0 0  Number falls in past yr: 0 0 0 0 0  Injury with Fall? 0 0 0 0 0       08/02/2022   11:04 AM 07/18/2022   11:28 AM 07/05/2022    8:58 AM  PHQ9 SCORE ONLY  PHQ-9 Total Score 2 2 0    Adult vaccines due  Topic Date Due   TETANUS/TDAP  07/10/2020    Health Maintenance Due  Topic Date Due   TETANUS/TDAP  07/10/2020   COVID-19 Vaccine (5 - Pfizer risk series) 12/21/2021   Zoster Vaccines- Shingrix (2 of 2) 04/03/2022   INFLUENZA VACCINE  07/17/2022      History/P.E. limitations: Hard of hearing and dementia  Adult vaccines due  Topic Date Due   TETANUS/TDAP  07/10/2020   There are no preventive care reminders to display for this patient.   Health Maintenance Due  Topic Date Due   TETANUS/TDAP  07/10/2020   COVID-19 Vaccine (5 - Pfizer risk series) 12/21/2021   Zoster Vaccines- Shingrix (2 of 2) 04/03/2022   INFLUENZA VACCINE  07/17/2022     Chief Complaint  Patient presents with   Follow-up     --------------------------------------------------------------------------------------------------------------------------------------------- Visit Problem List with A/P  No problem-specific Assessment & Plan notes found for this encounter.

## 2022-08-03 NOTE — Assessment & Plan Note (Addendum)
Established problem Well Controlled and is at goal of minimal peripheral edema and minimal DOE. Has been taking lasix 20 mg daily.    Wt Readings from Last 3 Encounters:  08/02/22 149 lb (67.6 kg)  08/01/22 154 lb (69.9 kg)  07/18/22 154 lb 6.4 oz (70 kg)    Gen: no acute disease Cor: distant, no gallup, reg reg rhythe Lungs: BCTA no acc mm use Ext: trance ankle edema bilaterally  A/ No signs of complications, medication side effects, or red flags. Change Lasix to 20 mg prn daily when leg edema or DOE/SHOB Continue metoprolol XL 50 mg daily  Next office visit recommend start SGLT2i to reduce hospitalization chances for patient

## 2022-08-03 NOTE — Assessment & Plan Note (Addendum)
Established problem Fair pain control. Nicole Perez is able to complete her ADLs independently with aid of opioid therapy. No aberrant behaviors Constipatiion under control PDMP withour red flags  Refilled her Hysdrocodone-apap 10-325 # 120

## 2022-08-03 NOTE — Assessment & Plan Note (Addendum)
Established problem Patient evaluated by Dr Donzetta Matters (Vasc) yesterday. 08/01/22 ABI (Vascular Surgery, Dr Donzetta Matters) Left ABI = 0.71 and left Toe TBI 0.71 Right ABI = 0.68 and Right Toe TBI 0.57  Dr Donzetta Matters did not believe patient's left leg pain comes from symptomatic PAD.   Patient is no longer on Apixaban

## 2022-08-03 NOTE — Assessment & Plan Note (Signed)
Basic Metabolic Panel:    Component Value Date/Time   NA 141 08/02/2022 1457   K 4.1 08/02/2022 1457   CL 101 08/02/2022 1457   CO2 23 08/02/2022 1457   BUN 27 08/02/2022 1457   CREATININE 1.09 (H) 08/02/2022 1457   CREATININE 0.78 04/16/2016 1649   GLUCOSE 105 (H) 08/02/2022 1457   GLUCOSE 141 (H) 07/13/2022 0223   CALCIUM 9.4 08/02/2022 1457   Relatively stable. Would recommend start of SGLT2i next visit, like Farxiga 10 mg daily for renal protection and for diastolic heart failure

## 2022-08-03 NOTE — Assessment & Plan Note (Signed)
Established problem. Stable. Patient not currently symptomatic Not using albuterol meter dosed inhaler since hospital DC No signs of complications, medication side effects, or red flags. Continue current medications  PFTs with Dr Valentina Lucks at end of month.

## 2022-08-08 ENCOUNTER — Ambulatory Visit: Payer: Medicare Other | Admitting: Pharmacist

## 2022-08-08 ENCOUNTER — Telehealth: Payer: Self-pay

## 2022-08-08 DIAGNOSIS — I110 Hypertensive heart disease with heart failure: Secondary | ICD-10-CM | POA: Diagnosis not present

## 2022-08-08 DIAGNOSIS — D649 Anemia, unspecified: Secondary | ICD-10-CM | POA: Diagnosis not present

## 2022-08-08 DIAGNOSIS — M4800 Spinal stenosis, site unspecified: Secondary | ICD-10-CM | POA: Diagnosis not present

## 2022-08-08 DIAGNOSIS — I5031 Acute diastolic (congestive) heart failure: Secondary | ICD-10-CM | POA: Diagnosis not present

## 2022-08-08 DIAGNOSIS — J439 Emphysema, unspecified: Secondary | ICD-10-CM | POA: Diagnosis not present

## 2022-08-08 NOTE — Telephone Encounter (Signed)
Received phone call from [place], [place] with [facility] regarding patient having elevated BP this morning.   Reports that at start of visit BP was 178/100. Rechecked after activity, elevated to 182/110. Patient has been taking BP medications as prescribed.   Patient is asymptomatic at this time. HH RN will have visit tomorrow and will recheck BP at this time.  Please advise if BP medication needs adjustment.   ED precautions given.   Talbot Grumbling, RN

## 2022-08-09 DIAGNOSIS — I110 Hypertensive heart disease with heart failure: Secondary | ICD-10-CM | POA: Diagnosis not present

## 2022-08-09 DIAGNOSIS — M4800 Spinal stenosis, site unspecified: Secondary | ICD-10-CM | POA: Diagnosis not present

## 2022-08-09 DIAGNOSIS — J439 Emphysema, unspecified: Secondary | ICD-10-CM | POA: Diagnosis not present

## 2022-08-09 DIAGNOSIS — I5031 Acute diastolic (congestive) heart failure: Secondary | ICD-10-CM | POA: Diagnosis not present

## 2022-08-09 DIAGNOSIS — D649 Anemia, unspecified: Secondary | ICD-10-CM | POA: Diagnosis not present

## 2022-08-09 NOTE — Telephone Encounter (Signed)
Nicole Guiles, RN with [facility] calls nurse line with patient's blood pressure readings.   Reports that BP remained elevated at 170/100 and 160/90. She continues to be asymptomatic.  Please advise next steps.   *She has a visit with Dr. Valentina Lucks on 8/31 and PCP follow up on 9/28.   Talbot Grumbling, RN

## 2022-08-14 DIAGNOSIS — M4800 Spinal stenosis, site unspecified: Secondary | ICD-10-CM | POA: Diagnosis not present

## 2022-08-14 DIAGNOSIS — I5031 Acute diastolic (congestive) heart failure: Secondary | ICD-10-CM | POA: Diagnosis not present

## 2022-08-14 DIAGNOSIS — J439 Emphysema, unspecified: Secondary | ICD-10-CM | POA: Diagnosis not present

## 2022-08-14 DIAGNOSIS — I110 Hypertensive heart disease with heart failure: Secondary | ICD-10-CM | POA: Diagnosis not present

## 2022-08-14 DIAGNOSIS — D649 Anemia, unspecified: Secondary | ICD-10-CM | POA: Diagnosis not present

## 2022-08-16 ENCOUNTER — Encounter: Payer: Self-pay | Admitting: Pharmacist

## 2022-08-16 ENCOUNTER — Ambulatory Visit (INDEPENDENT_AMBULATORY_CARE_PROVIDER_SITE_OTHER): Payer: Medicare Other | Admitting: Pharmacist

## 2022-08-16 DIAGNOSIS — J439 Emphysema, unspecified: Secondary | ICD-10-CM | POA: Diagnosis not present

## 2022-08-16 NOTE — Assessment & Plan Note (Signed)
Patient has been experiencing dyspnea/breathing difficulties for about 2 months and taking Ventolin HFA as needed for rescue. Reports using rescue inhaler is helpful for relieving breathing. Spirometry evaluation with pre-bronchodilator reveals normal lung function.  Spirometry [facility] based on CAT score and recent exacerbation with hospitalization.   Patient medication adherence is good, reported taking all medication as prescribed except for gabapentin, this makes her dizzy so she takes it as needed.  Subjectively improving in her breathing and exercise tolerance with P/T.  -no change continue taking all medication as instructed - continue albuterol 2 puffs PRN (each morning is fine) -Reviewed results of pulmonary function tests.  Pt verbalized understanding of results and education.

## 2022-08-16 NOTE — Patient Instructions (Signed)
It was nice to see you today! There are no changes to your medication today, continue to take all of your medication as instructed. The results of your breathing test were good, we are not concerned about your lung function at this time. Take care!

## 2022-08-16 NOTE — Progress Notes (Signed)
Reviewed: I agree with Dr. Koval's documentation and management. 

## 2022-08-16 NOTE — Progress Notes (Signed)
   S:     Chief Complaint  Patient presents with   Medication Management    PFT/Spirometry   Nicole Perez is a 88 y.o. female who presents for lung function evaluation.  PMH is significant for CKD, COPD.  Patient was referred and last seen by Primary Care Provider, Dr. McDiarmid, on 08/02/2022.   Patient reports breathing has been bad lately, she went to the ED on 07/11/2022 for a COPD exacerbation. Patient reports her breathing quality was decreased for about 3 weeks before that.   Medication adherence reported good. Takes gabapentin as needed because it makes her dizzy, does report that it helps with her feet though. Is not taking the solifenacin (Vesicare) right now, has not needed it lately. Takes hydrocodone/acetaminophen in the morning usually, feels that it is good for pain management.  Patient reports last dose of COPD medications was this morning. Used ventolin inhaler when she woke up. Current COPD medications: Ventolin HFA  Rescue inhaler use frequency: Daily in the morning Patient exacerbation hx: Went to the ED 07/11/2022 for an exacerbation, breathing has been bad since then.  O: Review of Systems  Respiratory:  Positive for cough and shortness of breath.     Physical Exam Constitutional:      Appearance: She is obese.  Cardiovascular:     Rate and Rhythm: Tachycardia present.  Pulmonary:     Effort: Pulmonary effort is normal.  Musculoskeletal:     Right lower leg: No edema.     Left lower leg: No edema.  Neurological:     Mental Status: Mental status is at baseline.  Psychiatric:        Mood and Affect: Mood normal.        Thought Content: Thought content normal.     Vitals:   08/16/22 1111 08/16/22 1129  BP: (!) 155/106 (!) 155/98  Pulse: 98   SpO2: 98%      CAT score= 10 See Documentation Flowsheet - CAT/COPD for complete symptom scoring.  See "scanned report" or Documentation Flowsheet (discrete results - PFTs) for Spirometry results. Patient  provided good effort while attempting spirometry.   Lung Age = 89   A/P: Patient has been experiencing dyspnea/breathing difficulties for about 2 months and taking Ventolin HFA as needed for rescue. Reports using rescue inhaler is helpful for relieving breathing. Spirometry evaluation with pre-bronchodilator reveals normal lung function.  Spirometry [facility] based on CAT score and recent exacerbation with hospitalization.   Patient medication adherence is good, reported taking all medication as prescribed except for gabapentin, this makes her dizzy so she takes it as needed.  Subjectively improving in her breathing and exercise tolerance with P/T.  -no change continue taking all medication as instructed - continue albuterol 2 puffs PRN (each morning is fine) -Reviewed results of pulmonary function tests.  Pt verbalized understanding of results and education.    Written patient instructions provided.   Total time in face to face counseling 30 minutes.    Follow-up:  Pharmacist Dr. Valentina Lucks. PCP clinic visit with Dr. Wendy Poet on 09/13/2022.  Patient seen with Martina Sinner, PharmD Candidate and Titus Dubin, PharmD PGY-1 Resident.

## 2022-08-17 DIAGNOSIS — D649 Anemia, unspecified: Secondary | ICD-10-CM | POA: Diagnosis not present

## 2022-08-17 DIAGNOSIS — I110 Hypertensive heart disease with heart failure: Secondary | ICD-10-CM | POA: Diagnosis not present

## 2022-08-17 DIAGNOSIS — M4800 Spinal stenosis, site unspecified: Secondary | ICD-10-CM | POA: Diagnosis not present

## 2022-08-17 DIAGNOSIS — J439 Emphysema, unspecified: Secondary | ICD-10-CM | POA: Diagnosis not present

## 2022-08-17 DIAGNOSIS — I5031 Acute diastolic (congestive) heart failure: Secondary | ICD-10-CM | POA: Diagnosis not present

## 2022-08-23 DIAGNOSIS — I5031 Acute diastolic (congestive) heart failure: Secondary | ICD-10-CM | POA: Diagnosis not present

## 2022-08-23 DIAGNOSIS — I110 Hypertensive heart disease with heart failure: Secondary | ICD-10-CM | POA: Diagnosis not present

## 2022-08-23 DIAGNOSIS — D649 Anemia, unspecified: Secondary | ICD-10-CM | POA: Diagnosis not present

## 2022-08-23 DIAGNOSIS — M4800 Spinal stenosis, site unspecified: Secondary | ICD-10-CM | POA: Diagnosis not present

## 2022-08-23 DIAGNOSIS — J439 Emphysema, unspecified: Secondary | ICD-10-CM | POA: Diagnosis not present

## 2022-08-24 DIAGNOSIS — M4800 Spinal stenosis, site unspecified: Secondary | ICD-10-CM | POA: Diagnosis not present

## 2022-08-24 DIAGNOSIS — I5031 Acute diastolic (congestive) heart failure: Secondary | ICD-10-CM | POA: Diagnosis not present

## 2022-08-24 DIAGNOSIS — I110 Hypertensive heart disease with heart failure: Secondary | ICD-10-CM | POA: Diagnosis not present

## 2022-08-24 DIAGNOSIS — J439 Emphysema, unspecified: Secondary | ICD-10-CM | POA: Diagnosis not present

## 2022-08-24 DIAGNOSIS — D649 Anemia, unspecified: Secondary | ICD-10-CM | POA: Diagnosis not present

## 2022-08-30 DIAGNOSIS — R339 Retention of urine, unspecified: Secondary | ICD-10-CM | POA: Diagnosis not present

## 2022-09-03 ENCOUNTER — Telehealth: Payer: Self-pay

## 2022-09-03 ENCOUNTER — Other Ambulatory Visit: Payer: Self-pay

## 2022-09-03 DIAGNOSIS — M19079 Primary osteoarthritis, unspecified ankle and foot: Secondary | ICD-10-CM

## 2022-09-03 DIAGNOSIS — M48062 Spinal stenosis, lumbar region with neurogenic claudication: Secondary | ICD-10-CM

## 2022-09-03 DIAGNOSIS — M4804 Spinal stenosis, thoracic region: Secondary | ICD-10-CM

## 2022-09-03 DIAGNOSIS — G959 Disease of spinal cord, unspecified: Secondary | ICD-10-CM

## 2022-09-03 DIAGNOSIS — M4715 Other spondylosis with myelopathy, thoracolumbar region: Secondary | ICD-10-CM

## 2022-09-03 DIAGNOSIS — G894 Chronic pain syndrome: Secondary | ICD-10-CM

## 2022-09-03 DIAGNOSIS — G63 Polyneuropathy in diseases classified elsewhere: Secondary | ICD-10-CM

## 2022-09-03 DIAGNOSIS — M461 Sacroiliitis, not elsewhere classified: Secondary | ICD-10-CM

## 2022-09-03 DIAGNOSIS — G8929 Other chronic pain: Secondary | ICD-10-CM

## 2022-09-03 DIAGNOSIS — M47812 Spondylosis without myelopathy or radiculopathy, cervical region: Secondary | ICD-10-CM

## 2022-09-03 DIAGNOSIS — M160 Bilateral primary osteoarthritis of hip: Secondary | ICD-10-CM

## 2022-09-03 DIAGNOSIS — M19071 Primary osteoarthritis, right ankle and foot: Secondary | ICD-10-CM

## 2022-09-03 DIAGNOSIS — M170 Bilateral primary osteoarthritis of knee: Secondary | ICD-10-CM

## 2022-09-03 DIAGNOSIS — M159 Polyosteoarthritis, unspecified: Secondary | ICD-10-CM

## 2022-09-03 MED ORDER — ALBUTEROL SULFATE HFA 108 (90 BASE) MCG/ACT IN AERS
2.0000 | INHALATION_SPRAY | Freq: Four times a day (QID) | RESPIRATORY_TRACT | 2 refills | Status: DC | PRN
Start: 2022-09-03 — End: 2023-12-19

## 2022-09-03 MED ORDER — HYDROCODONE-ACETAMINOPHEN 10-325 MG PO TABS: 1.0000 | ORAL_TABLET | Freq: Four times a day (QID) | ORAL | 0 refills | Status: DC | PRN

## 2022-09-03 NOTE — Telephone Encounter (Signed)
Velna calls nurse line requesting a refill on patients pain medication.   I do not see any on patients current medication list.   Will forward to PCP.

## 2022-09-04 ENCOUNTER — Telehealth: Payer: Self-pay

## 2022-09-04 DIAGNOSIS — G8929 Other chronic pain: Secondary | ICD-10-CM

## 2022-09-04 DIAGNOSIS — M461 Sacroiliitis, not elsewhere classified: Secondary | ICD-10-CM

## 2022-09-04 DIAGNOSIS — F1190 Opioid use, unspecified, uncomplicated: Secondary | ICD-10-CM

## 2022-09-04 DIAGNOSIS — G63 Polyneuropathy in diseases classified elsewhere: Secondary | ICD-10-CM

## 2022-09-04 DIAGNOSIS — G894 Chronic pain syndrome: Secondary | ICD-10-CM

## 2022-09-04 DIAGNOSIS — M48062 Spinal stenosis, lumbar region with neurogenic claudication: Secondary | ICD-10-CM

## 2022-09-04 DIAGNOSIS — G959 Disease of spinal cord, unspecified: Secondary | ICD-10-CM

## 2022-09-04 DIAGNOSIS — M160 Bilateral primary osteoarthritis of hip: Secondary | ICD-10-CM

## 2022-09-04 DIAGNOSIS — M19079 Primary osteoarthritis, unspecified ankle and foot: Secondary | ICD-10-CM

## 2022-09-04 DIAGNOSIS — M47812 Spondylosis without myelopathy or radiculopathy, cervical region: Secondary | ICD-10-CM

## 2022-09-04 DIAGNOSIS — M4715 Other spondylosis with myelopathy, thoracolumbar region: Secondary | ICD-10-CM

## 2022-09-04 DIAGNOSIS — M159 Polyosteoarthritis, unspecified: Secondary | ICD-10-CM

## 2022-09-04 DIAGNOSIS — M4804 Spinal stenosis, thoracic region: Secondary | ICD-10-CM

## 2022-09-04 DIAGNOSIS — M170 Bilateral primary osteoarthritis of knee: Secondary | ICD-10-CM

## 2022-09-04 NOTE — Telephone Encounter (Signed)
Patients daughter calls nurse line in regards to recent Hydrocodone prescription.   [facility] has no stock of Hydrocodone 10-'325mg'$ .  I called the pharmacy and they can do the 5-'325mg'$ . I cancelled out the 10-'325mg'$ .   Will forward to PCP.

## 2022-09-05 MED ORDER — HYDROCODONE-ACETAMINOPHEN 5-325 MG PO TABS: 2.0000 | ORAL_TABLET | Freq: Four times a day (QID) | ORAL | 0 refills | Status: DC | PRN

## 2022-09-05 NOTE — Telephone Encounter (Signed)
Substitution of hydrocodone-APAP 5/325, 2 tab q6h prn pain #240 RF-0 Prescription sent given non-availability of hydrocodne-APAP 10-325 tablets at patient's pharamcy

## 2022-09-13 ENCOUNTER — Encounter: Payer: Self-pay | Admitting: Family Medicine

## 2022-09-13 ENCOUNTER — Ambulatory Visit (INDEPENDENT_AMBULATORY_CARE_PROVIDER_SITE_OTHER): Payer: Medicare Other | Admitting: Family Medicine

## 2022-09-13 ENCOUNTER — Ambulatory Visit (HOSPITAL_COMMUNITY)
Admission: RE | Admit: 2022-09-13 | Discharge: 2022-09-13 | Disposition: A | Discharge status: Home / Self Care | Payer: Medicare Other | Source: Ambulatory Visit | Attending: Family Medicine | Admitting: Family Medicine

## 2022-09-13 VITALS — BP 155/95 | HR 84 | Ht 59.0 in | Wt 147.4 lb

## 2022-09-13 DIAGNOSIS — I4819 Other persistent atrial fibrillation: Secondary | ICD-10-CM | POA: Insufficient documentation

## 2022-09-13 DIAGNOSIS — J439 Emphysema, unspecified: Secondary | ICD-10-CM | POA: Diagnosis not present

## 2022-09-13 DIAGNOSIS — I10 Essential (primary) hypertension: Secondary | ICD-10-CM

## 2022-09-13 DIAGNOSIS — I482 Chronic atrial fibrillation, unspecified: Secondary | ICD-10-CM

## 2022-09-13 DIAGNOSIS — I5032 Chronic diastolic (congestive) heart failure: Secondary | ICD-10-CM

## 2022-09-13 DIAGNOSIS — D638 Anemia in other chronic diseases classified elsewhere: Secondary | ICD-10-CM | POA: Diagnosis not present

## 2022-09-13 DIAGNOSIS — I712 Thoracic aortic aneurysm, without rupture, unspecified: Secondary | ICD-10-CM

## 2022-09-13 DIAGNOSIS — D62 Acute posthemorrhagic anemia: Secondary | ICD-10-CM

## 2022-09-13 DIAGNOSIS — Z23 Encounter for immunization: Secondary | ICD-10-CM

## 2022-09-13 NOTE — Patient Instructions (Addendum)
Your heart is in an irregular rhythm called atrial fibrillation.  We will talk more about it when I see you back in two weeks.   Please start taking your iron (ferrous sulfate) tablet every other day instead of every day.  Your blood pressure is a little high, please increase your Losartan 25 mg tablets to two tablets every day instead of one every day.    Continue taking your metoprolol 50 mg tablet, one tablet a day.  You can take it with the Losartan.   Continue taking the Pantoprazole medication, one pill a day.  It prevents you from bleeding in your stomach.   We are checking your hemoglobin, electrolytes, kidneys, liver and thyroid today.

## 2022-09-14 ENCOUNTER — Encounter: Payer: Self-pay | Admitting: Family Medicine

## 2022-09-14 DIAGNOSIS — I4819 Other persistent atrial fibrillation: Secondary | ICD-10-CM | POA: Insufficient documentation

## 2022-09-14 DIAGNOSIS — I4891 Unspecified atrial fibrillation: Secondary | ICD-10-CM | POA: Insufficient documentation

## 2022-09-14 LAB — TSH: TSH: 2.71 u[IU]/mL (ref 0.450–4.500)

## 2022-09-14 LAB — CMP14+EGFR
ALT: 9 IU/L (ref 0–32)
AST: 16 IU/L (ref 0–40)
Albumin/Globulin Ratio: 1.6 (ref 1.2–2.2)
Albumin: 4.2 g/dL (ref 3.7–4.7)
Alkaline Phosphatase: 61 IU/L (ref 44–121)
BUN/Creatinine Ratio: 17 (ref 12–28)
BUN: 19 mg/dL (ref 8–27)
Bilirubin Total: 0.4 mg/dL (ref 0.0–1.2)
CO2: 21 mmol/L (ref 20–29)
Calcium: 9.3 mg/dL (ref 8.7–10.3)
Chloride: 103 mmol/L (ref 96–106)
Creatinine, Ser: 1.12 mg/dL — ABNORMAL HIGH (ref 0.57–1.00)
Globulin, Total: 2.6 g/dL (ref 1.5–4.5)
Glucose: 85 mg/dL (ref 70–99)
Potassium: 3.9 mmol/L (ref 3.5–5.2)
Sodium: 141 mmol/L (ref 134–144)
Total Protein: 6.8 g/dL (ref 6.0–8.5)
eGFR: 48 mL/min/{1.73_m2} — ABNORMAL LOW (ref >59)

## 2022-09-14 LAB — CBC
Hematocrit: 31.8 % — ABNORMAL LOW (ref 34.0–46.6)
Hemoglobin: 10.5 g/dL — ABNORMAL LOW (ref 11.1–15.9)
MCH: 27.3 pg (ref 26.6–33.0)
MCHC: 33 g/dL (ref 31.5–35.7)
MCV: 83 fL (ref 79–97)
Platelets: 234 10*3/uL (ref 150–450)
RBC: 3.84 x10E6/uL (ref 3.77–5.28)
RDW: 14 % (ref 11.7–15.4)
WBC: 3.8 10*3/uL (ref 3.4–10.8)

## 2022-09-14 NOTE — Assessment & Plan Note (Addendum)
Established problem. Of note, last echo in July did not mention diastolic dysfunction (though no E/e' measurement, no est PAP), but experienced rapid improvement in dyspnea during July hospitalization with aggressive diuretic therapy. Stable. Patient is at goal of being asymptomatic without peripheral edema. Only uses Lasix once a week or less if there is leg edema.   No signs of complications, medication side effects, or red flags. Continue current medications and other regiments. I am hesitant to add SGLT2i or spironolactone.   Plan: Increase Losartan 25 mg tablet from one a day to two tab a day.

## 2022-09-14 NOTE — Progress Notes (Signed)
ALAISHA Perez is accompanied by daughter, [name] Sources of clinical information for visit is/are patient, relative(s), and past medical records. Nursing assessment for this office visit was reviewed with the patient for accuracy and revision.     Previous Report(s) Reviewed: 07/13/22 DC summary; Spirometry (08/16/22) which was normal    09/13/2022   11:08 AM  Depression screen PHQ 2/9  Decreased Interest 0  Down, Depressed, Hopeless 0  PHQ - 2 Score 0  Altered sleeping 0  Tired, decreased energy 2  Change in appetite 0  Feeling bad or failure about yourself  0  Trouble concentrating 0  Moving slowly or fidgety/restless 0  Suicidal thoughts 0  PHQ-9 Score 2   [place] Office Visit from 09/13/2022 in [place] Office Visit from 08/02/2022 in [place] Office Visit from 07/18/2022 in [place]  Thoughts that you would be better off dead, or of hurting yourself in some way Not at all Not at all Not at all  PHQ-9 Total Score '2 2 2          '$ 09/13/2022   11:08 AM 06/14/2022    3:52 PM 03/08/2022    9:41 AM 03/08/2022    9:33 AM 10/26/2021    3:32 PM  [place] in the past year? 0 0 0 0 0  Number falls in past yr: 0 0 0 0 0  Injury with Fall? 0 0 0 0 0       09/13/2022   11:08 AM 08/02/2022   11:04 AM 07/18/2022   11:28 AM  PHQ9 SCORE ONLY  PHQ-9 Total Score '2 2 2    '$ Adult vaccines due  Topic Date Due   TETANUS/TDAP  07/10/2020    Health Maintenance Due  Topic Date Due   TETANUS/TDAP  07/10/2020   COVID-19 Vaccine (5 - Pfizer risk series) 12/21/2021   Zoster Vaccines- Shingrix (2 of 2) 04/03/2022      History/P.E. limitations: HOH, cognitive impairment  Adult vaccines due  Topic Date Due   TETANUS/TDAP  07/10/2020   There are no preventive care reminders to display for this patient.  Health Maintenance Due  Topic Date Due   TETANUS/TDAP  07/10/2020   COVID-19 Vaccine (5 -  Pfizer risk series) 12/21/2021   Zoster Vaccines- Shingrix (2 of 2) 04/03/2022     Chief Complaint  Patient presents with   Follow-up     --------------------------------------------------------------------------------------------------------------------------------------------- Visit Problem List with A/P  Chronic obstructive pulmonary disease ([place]) Established problem While [name] has emphysematous lung changes on CTA Chest, her recent spiromety with Dr Valentina Lucks found normal lung function.  [name] tends to use her albuterol meter dosed inhaler before she is going to exert herself above usual, such as coming into the doctor's office.  [name] says she uses the albuterol several times a day.    If applied GOLD COPD criteria, despite normal spirometry measurements), she would meet class B because of her infrequent exacerbations and her perceived, signifiant DOE.   I suspect anxiety and deconditioning with contribution from persistent A-fib, diastolic dysfunction, and albuterol tachyphylaxis may contribute to the feeling of DOE.  Options could include Pulmonary Rehab, trial of LAMA , SSRI. Will discuss with patient at next visit in two weeks.    Will work on this  Chronic diastolic heart failure ([place]) Established problem. Of note, last echo in July did not mention diastolic dysfunction (though no E/e'  measurement, no est PAP), but experienced rapid improvement in dyspnea during July hospitalization with aggressive diuretic therapy. Stable. Patient is at goal of being asymptomatic without peripheral edema. Only uses Lasix once a week or less if there is leg edema.   No signs of complications, medication side effects, or red flags. Continue current medications and other regiments. I am hesitant to add SGLT2i or spironolactone.   Plan: Increase Losartan 25 mg tablet from one a day to two tab a day.   Atrial fibrillation, persistent ([place]) Established problem Well Controlled and  is at goal of rate control.  BPM 60 -80s today.  She is not anticoag candidate because history of major GI bleeds.  No signs of complications, medication side effects, or red flags. Continue current medications and other regiments.   Aortic aneurysm without rupture (HCC) Need to refer to vascular surgery to monitor next ov  Essential hypertension, benign Established problem Uncontrolled.  Patient is not at goal of <140/90. Increase losartan 25 mg tab to two tab daily Continue: metoprolol succ 50 mg daily  Recheck two weeks.  Likely add back HCTZ   Anemia of chronic disease Established problem. CBC:    Component Value Date/Time   WBC 3.8 09/13/2022 1446   WBC 5.8 07/13/2022 0223   HGB 10.5 (L) 09/13/2022 1446   HCT 31.8 (L) 09/13/2022 1446   PLT 234 09/13/2022 1446   MCV 83 09/13/2022 1446   NEUTROABS 2.6 07/11/2022 2114   NEUTROABS 6.0 06/21/2022 1204   LYMPHSABS 1.3 07/11/2022 2114   LYMPHSABS 1.3 06/21/2022 1204   MONOABS 0.3 07/11/2022 2114   EOSABS 0.2 07/11/2022 2114   EOSABS 0.1 06/21/2022 1204   BASOSABS 0.0 07/11/2022 2114   BASOSABS 0.1 06/21/2022 1204     Stable.  Continue surveillance given history of major GI bleeds

## 2022-09-14 NOTE — Assessment & Plan Note (Signed)
Established problem Uncontrolled.  Patient is not at goal of <140/90. Increase losartan 25 mg tab to two tab daily Continue: metoprolol succ 50 mg daily  Recheck two weeks.  Likely add back HCTZ

## 2022-09-14 NOTE — Assessment & Plan Note (Signed)
Need to refer to vascular surgery to monitor next ov

## 2022-09-14 NOTE — Assessment & Plan Note (Signed)
Established problem Well Controlled and is at goal of rate control.  BPM 60 -80s today.  She is not anticoag candidate because history of major GI bleeds.  No signs of complications, medication side effects, or red flags. Continue current medications and other regiments.

## 2022-09-14 NOTE — Assessment & Plan Note (Addendum)
Established problem While [name] has emphysematous lung changes on CTA Chest, her recent spiromety with Dr Valentina Lucks found normal lung function.  [name] tends to use her albuterol meter dosed inhaler before she is going to exert herself above usual, such as coming into the doctor's office.  Nicole Perez Patient says she uses the albuterol several times a day.    If applied GOLD COPD criteria, despite normal spirometry measurements), she would meet class B because of her infrequent exacerbations and her perceived, signifiant DOE.   I suspect anxiety and deconditioning with contribution from persistent A-fib, diastolic dysfunction, and albuterol tachyphylaxis may contribute to the feeling of DOE.  Options could include Pulmonary Rehab, trial of LAMA , SSRI. Will discuss with patient at next visit in two weeks.    Will work on this

## 2022-09-14 NOTE — Assessment & Plan Note (Signed)
Established problem. CBC:    Component Value Date/Time   WBC 3.8 09/13/2022 1446   WBC 5.8 07/13/2022 0223   HGB 10.5 (L) 09/13/2022 1446   HCT 31.8 (L) 09/13/2022 1446   PLT 234 09/13/2022 1446   MCV 83 09/13/2022 1446   NEUTROABS 2.6 07/11/2022 2114   NEUTROABS 6.0 06/21/2022 1204   LYMPHSABS 1.3 07/11/2022 2114   LYMPHSABS 1.3 06/21/2022 1204   MONOABS 0.3 07/11/2022 2114   EOSABS 0.2 07/11/2022 2114   EOSABS 0.1 06/21/2022 1204   BASOSABS 0.0 07/11/2022 2114   BASOSABS 0.1 06/21/2022 1204     Stable.  Continue surveillance given history of major GI bleeds

## 2022-10-04 ENCOUNTER — Ambulatory Visit (INDEPENDENT_AMBULATORY_CARE_PROVIDER_SITE_OTHER): Payer: Medicare Other | Admitting: Family Medicine

## 2022-10-04 ENCOUNTER — Encounter: Payer: Self-pay | Admitting: Family Medicine

## 2022-10-04 VITALS — BP 150/100 | HR 80 | Ht 59.0 in | Wt 145.2 lb

## 2022-10-04 DIAGNOSIS — I10 Essential (primary) hypertension: Secondary | ICD-10-CM

## 2022-10-04 DIAGNOSIS — I5032 Chronic diastolic (congestive) heart failure: Secondary | ICD-10-CM | POA: Diagnosis not present

## 2022-10-04 DIAGNOSIS — I480 Paroxysmal atrial fibrillation: Secondary | ICD-10-CM | POA: Diagnosis not present

## 2022-10-04 NOTE — Patient Instructions (Addendum)
Please stop taking the Losartan pill (little white pill).  Start taking Hyzaar 100-12.5 mg tablet, one tablet a day.  Continue taking the Metoprolol succinate (Big pill) tablet, one tablet a day.  Dr Shulem Mader would like to check how your blood pressure is in two weeks.      CoViD-19 Vaccine The CDC recommends the COVID-19 monovalent 2023-24 vaccination for everyone ages 20 months and older in the [place] for the prevention of COVID-19. There is no preferential recommendation for the use of any one COVID-19 vaccine over another when more than one recommended and age-appropriate vaccine is available.

## 2022-10-05 ENCOUNTER — Encounter: Payer: Self-pay | Admitting: Family Medicine

## 2022-10-05 MED ORDER — LOSARTAN POTASSIUM-HCTZ 100-12.5 MG PO TABS: 1.0000 | ORAL_TABLET | Freq: Every day | ORAL | 3 refills | Status: DC

## 2022-10-05 MED ORDER — METOPROLOL SUCCINATE ER 50 MG PO TB24: 50.0000 mg | ORAL_TABLET | Freq: Every day | ORAL | 3 refills | Status: DC

## 2022-10-05 NOTE — Assessment & Plan Note (Signed)
Established problem Ms Brester did not increase her losartan dose as recommended at her recnet office visit with me.  She seems tohave interpreted so advice from a home health visitor that she was to take only one losartan tab a day.  Uncontrolled.  Patient is not at goal of <130/80.  Start: Hyzaar 100/12.5 tablet, one tab daily Stop: Stop losartan tablets Continue: metoprolol XL 50 mg tab, one tab daily.

## 2022-10-05 NOTE — Progress Notes (Signed)
Nicole Perez is accompanied by daughter Sources of clinical information for visit is/are patient and relative(s). Nursing assessment for this office visit was reviewed with the patient for accuracy and revision.     Previous Report(s) Reviewed: none     09/13/2022   11:08 AM  Depression screen PHQ 2/9  Decreased Interest 0  Down, Depressed, Hopeless 0  PHQ - 2 Score 0  Altered sleeping 0  Tired, decreased energy 2  Change in appetite 0  Feeling bad or failure about yourself  0  Trouble concentrating 0  Moving slowly or fidgety/restless 0  Suicidal thoughts 0  PHQ-9 Score 2   [place] Office Visit from 09/13/2022 in [place] Office Visit from 08/02/2022 in [place] Office Visit from 07/18/2022 in [place]  Thoughts that you would be better off dead, or of hurting yourself in some way Not at all Not at all Not at all  PHQ-9 Total Score '2 2 2          '$ 10/04/2022    3:00 PM 09/13/2022   11:08 AM 06/14/2022    3:52 PM 03/08/2022    9:41 AM 03/08/2022    9:33 AM  [place] in the past year? 0 0 0 0 0  Number falls in past yr: 0 0 0 0 0  Injury with Fall? 0 0 0 0 0       09/13/2022   11:08 AM 08/02/2022   11:04 AM 07/18/2022   11:28 AM  PHQ9 SCORE ONLY  PHQ-9 Total Score '2 2 2    '$ Adult vaccines due  Topic Date Due   TETANUS/TDAP  07/10/2020    Health Maintenance Due  Topic Date Due   TETANUS/TDAP  07/10/2020   COVID-19 Vaccine (5 - Pfizer risk series) 12/21/2021   Zoster Vaccines- Shingrix (2 of 2) 04/03/2022      History/P.E. limitations: none  Adult vaccines due  Topic Date Due   TETANUS/TDAP  07/10/2020   There are no preventive care reminders to display for this patient.  Health Maintenance Due  Topic Date Due   TETANUS/TDAP  07/10/2020   COVID-19 Vaccine (5 - Pfizer risk series) 12/21/2021   Zoster Vaccines- Shingrix (2 of 2) 04/03/2022     Chief Complaint  Patient  presents with   Follow-up    LOV     --------------------------------------------------------------------------------------------------------------------------------------------- Visit Problem List with A/P  No problem-specific Assessment & Plan notes found for this encounter.

## 2022-10-09 ENCOUNTER — Telehealth: Payer: Self-pay

## 2022-10-09 DIAGNOSIS — M4804 Spinal stenosis, thoracic region: Secondary | ICD-10-CM

## 2022-10-09 DIAGNOSIS — M170 Bilateral primary osteoarthritis of knee: Secondary | ICD-10-CM

## 2022-10-09 DIAGNOSIS — M461 Sacroiliitis, not elsewhere classified: Secondary | ICD-10-CM

## 2022-10-09 DIAGNOSIS — M19079 Primary osteoarthritis, unspecified ankle and foot: Secondary | ICD-10-CM

## 2022-10-09 DIAGNOSIS — G63 Polyneuropathy in diseases classified elsewhere: Secondary | ICD-10-CM

## 2022-10-09 DIAGNOSIS — G894 Chronic pain syndrome: Secondary | ICD-10-CM

## 2022-10-09 DIAGNOSIS — M47812 Spondylosis without myelopathy or radiculopathy, cervical region: Secondary | ICD-10-CM

## 2022-10-09 DIAGNOSIS — G959 Disease of spinal cord, unspecified: Secondary | ICD-10-CM

## 2022-10-09 DIAGNOSIS — F1190 Opioid use, unspecified, uncomplicated: Secondary | ICD-10-CM

## 2022-10-09 DIAGNOSIS — M159 Polyosteoarthritis, unspecified: Secondary | ICD-10-CM

## 2022-10-09 DIAGNOSIS — M48062 Spinal stenosis, lumbar region with neurogenic claudication: Secondary | ICD-10-CM

## 2022-10-09 DIAGNOSIS — G8929 Other chronic pain: Secondary | ICD-10-CM

## 2022-10-09 DIAGNOSIS — M160 Bilateral primary osteoarthritis of hip: Secondary | ICD-10-CM

## 2022-10-09 DIAGNOSIS — M4715 Other spondylosis with myelopathy, thoracolumbar region: Secondary | ICD-10-CM

## 2022-10-09 NOTE — Telephone Encounter (Signed)
Patient's daughter calls nurse line requesting medication refill on hydrocodone-acetaminophen.   This is no longer on medication list.   Last refill on 09/05/22 was for hydrocodone acetaminophen 5-325 mg. 10-325 mg is still on nationwide back order.   If appropriate, please send prescription for 5-325 mg to [facility] on [address].   Thanks.    Talbot Grumbling, RN

## 2022-10-10 MED ORDER — HYDROCODONE-ACETAMINOPHEN 5-325 MG PO TABS: 2.0000 | ORAL_TABLET | Freq: Four times a day (QID) | ORAL | 0 refills | Status: AC | PRN

## 2022-10-10 NOTE — Telephone Encounter (Signed)
Refill HC-APAP 5/325 tab, #240, 1 month supply. Prescribed in lieu of HC-APAP 10/325 tabs which seem to be in a [facility].

## 2022-10-25 ENCOUNTER — Encounter: Payer: Self-pay | Admitting: Family Medicine

## 2022-10-25 ENCOUNTER — Ambulatory Visit (INDEPENDENT_AMBULATORY_CARE_PROVIDER_SITE_OTHER): Payer: Medicare Other | Admitting: Family Medicine

## 2022-10-25 VITALS — BP 152/111 | HR 84 | Ht 59.0 in | Wt 142.0 lb

## 2022-10-25 DIAGNOSIS — I10 Essential (primary) hypertension: Secondary | ICD-10-CM | POA: Diagnosis not present

## 2022-10-25 DIAGNOSIS — Z79899 Other long term (current) drug therapy: Secondary | ICD-10-CM | POA: Diagnosis not present

## 2022-10-25 DIAGNOSIS — D638 Anemia in other chronic diseases classified elsewhere: Secondary | ICD-10-CM | POA: Diagnosis not present

## 2022-10-25 DIAGNOSIS — D500 Iron deficiency anemia secondary to blood loss (chronic): Secondary | ICD-10-CM

## 2022-10-25 DIAGNOSIS — D229 Melanocytic nevi, unspecified: Secondary | ICD-10-CM

## 2022-10-25 NOTE — Patient Instructions (Signed)
Your blood pressure is better.  I would like you to keep taking the Losartan with HCTZ  Dr Kennie Karapetian would like to see you before Christmas.    Dr Vera Wishart will look into a brace for your right foot.

## 2022-10-26 DIAGNOSIS — D229 Melanocytic nevi, unspecified: Secondary | ICD-10-CM | POA: Insufficient documentation

## 2022-10-26 LAB — BASIC METABOLIC PANEL
BUN/Creatinine Ratio: 24 (ref 12–28)
BUN: 28 mg/dL — ABNORMAL HIGH (ref 8–27)
CO2: 21 mmol/L (ref 20–29)
Calcium: 9.3 mg/dL (ref 8.7–10.3)
Chloride: 100 mmol/L (ref 96–106)
Creatinine, Ser: 1.16 mg/dL — ABNORMAL HIGH (ref 0.57–1.00)
Glucose: 101 mg/dL — ABNORMAL HIGH (ref 70–99)
Potassium: 4 mmol/L (ref 3.5–5.2)
Sodium: 140 mmol/L (ref 134–144)
eGFR: 46 mL/min/{1.73_m2} — ABNORMAL LOW (ref >59)

## 2022-10-26 LAB — CBC
Hematocrit: 34.3 % (ref 34.0–46.6)
Hemoglobin: 11.1 g/dL (ref 11.1–15.9)
MCH: 26.9 pg (ref 26.6–33.0)
MCHC: 32.4 g/dL (ref 31.5–35.7)
MCV: 83 fL (ref 79–97)
Platelets: 278 10*3/uL (ref 150–450)
RBC: 4.12 x10E6/uL (ref 3.77–5.28)
RDW: 14.3 % (ref 11.7–15.4)
WBC: 3.9 10*3/uL (ref 3.4–10.8)

## 2022-10-26 NOTE — Progress Notes (Signed)
Nicole Perez is accompanied by daughter Sources of clinical information for visit is/are patient and relative(s). Nursing assessment for this office visit was reviewed with the patient for accuracy and revision.     Previous Report(s) Reviewed: none     10/25/2022   11:03 AM  Depression screen PHQ 2/9  Decreased Interest 0  Down, Depressed, Hopeless 0  PHQ - 2 Score 0  Altered sleeping 0  Tired, decreased energy 0  Change in appetite 0  Feeling bad or failure about yourself  0  Trouble concentrating 0  Moving slowly or fidgety/restless 0  Suicidal thoughts 0  PHQ-9 Score 0   Flowsheet Row Office Visit from 10/25/2022 in [place] Office Visit from 09/13/2022 in [place] Office Visit from 08/02/2022 in [place]  Thoughts that you would be better off dead, or of hurting yourself in some way Not at all Not at all Not at all  PHQ-9 Total Score 0 2 2          10/25/2022   11:03 AM 10/04/2022    3:00 PM 09/13/2022   11:08 AM 06/14/2022    3:52 PM 03/08/2022    9:41 AM  Fall Risk   Falls in the past year? 0 0 0 0 0  Number falls in past yr: 0 0 0 0 0  Injury with Fall? 0 0 0 0 0       10/25/2022   11:03 AM 09/13/2022   11:08 AM 08/02/2022   11:04 AM  PHQ9 SCORE ONLY  PHQ-9 Total Score 0 2 2    Adult vaccines due  Topic Date Due   TETANUS/TDAP  07/10/2020    Health Maintenance Due  Topic Date Due   Medicare Annual Wellness (AWV)  10/02/2018   TETANUS/TDAP  07/10/2020   COVID-19 Vaccine (5 - Pfizer risk series) 12/21/2021   Zoster Vaccines- Shingrix (2 of 2) 04/03/2022      History/P.E. limitations: cognitive impairment and HOH  Adult vaccines due  Topic Date Due   TETANUS/TDAP  07/10/2020   There are no preventive care reminders to display for this patient.  Health Maintenance Due  Topic Date Due   Medicare Annual Wellness (AWV)  10/02/2018   TETANUS/TDAP  07/10/2020   COVID-19 Vaccine  (5 - Pfizer risk series) 12/21/2021   Zoster Vaccines- Shingrix (2 of 2) 04/03/2022     Chief Complaint  Patient presents with   Follow-up     --------------------------------------------------------------------------------------------------------------------------------------------- Visit Problem List with A/P  Essential hypertension, benign Established problem that has improved and has meet goal of SBP < 140.  No evidence of new end organ damage. No rash or swelling Continue current medication regiment. Checking BMET  Benign skin mole Established problem Present for years may have grwon very slowly Right anterior neck with pedunculated uniformly brown soft tissue mass with verrucoid surface Suspect benign dermal nevus.  Patient requesting referral to dermatologist for removal.  Referral placed.   Anemia due to blood loss, chronic No report of melena or hematochezia Given history of chronic blood loss anemia - will get surveillance cbc.  Hgb 11.1 No concern Continu periodic surveillance.

## 2022-10-26 NOTE — Assessment & Plan Note (Signed)
Established problem Present for years may have grwon very slowly Right anterior neck with pedunculated uniformly brown soft tissue mass with verrucoid surface Suspect benign dermal nevus.  Patient requesting referral to dermatologist for removal.  Referral placed.

## 2022-10-26 NOTE — Assessment & Plan Note (Addendum)
No report of melena or hematochezia Given history of chronic blood loss anemia - will get surveillance cbc.  Hgb 11.1 No concern Continu periodic surveillance.

## 2022-10-26 NOTE — Assessment & Plan Note (Addendum)
Established problem that has improved and has meet goal of SBP < 140.  No evidence of new end organ damage. No rash or swelling Continue current medication regiment. Checking BMET

## 2022-10-29 ENCOUNTER — Telehealth: Payer: Self-pay

## 2022-10-29 NOTE — Patient Outreach (Signed)
  Care Coordination   Initial Visit Note   10/29/2022 Name: Nicole Perez MRN: [id number] DOB: [birth year]  SHAHED Perez is a 88 y.o. year old female who sees Nicole Perez, Nicole Ohara, MD for primary care. I spoke with  patients daughter Nicole Perez by phone today.  What matters to the patients health and wellness today?  Patient needs some assistance at home.  Patients daughter works 12 hour shifts and they are having issues managing her Moms needs.    Goals Addressed             This Visit's Progress    Find assistance for patient to help get meals and some personal care       Care Coordination Interventions: Social Work referral for finding options for family in caring for patient. Discussed plans with patient for ongoing care management follow up and provided patient with direct contact information for care management team Assessed social determinant of health barriers          SDOH assessments and interventions completed:  Yes  SDOH Interventions Today    Flowsheet Row Most Recent Value  SDOH Interventions   Housing Interventions Intervention Not Indicated  Transportation Interventions Intervention Not Indicated        Care Coordination Interventions Activated:  Yes  Care Coordination Interventions:  Yes, provided   Follow up plan: Follow up call scheduled for 10/30/22 with Nicole Perez MSW    Encounter Outcome:  Pt. Visit Completed

## 2022-10-30 ENCOUNTER — Ambulatory Visit: Payer: Self-pay | Admitting: Licensed Clinical Social Worker

## 2022-10-30 NOTE — Patient Outreach (Signed)
  Care Coordination   Initial Visit Note   10/30/2022 Name: MAURIE MUSCO MRN: [id number] DOB: 05/31/1933  ANELA BENSMAN is a 88 y.o. year old female who sees McDiarmid, Blane Ohara, MD for primary care. I spoke with  [place] by phone today.  What matters to the patients health and wellness today?  Medicaid    Goals Addressed               This Visit's Progress     Care Coordination Activities- Medicaid (pt-stated)        Care Coordination Interventions: Social Work referral for [employer] Assessed social determinant of health barriers SW educated patient on [place]  Motivational Interviewing employed Solution-Focused Strategies employed:  Active listening / Reflection utilized  Emotional Support Provided Problem [place] strategies reviewed         SDOH assessments and interventions completed:  Yes     Care Coordination Interventions Activated:  Yes  Care Coordination Interventions:  Yes, provided   Follow up plan: No further intervention required.   Encounter Outcome:  Pt. Visit Completed

## 2022-10-30 NOTE — Patient Instructions (Signed)
Visit Information  Thank you for taking time to visit with me today. Please don't hesitate to contact me if I can be of assistance to you.   Following are the goals we discussed today:   Goals Addressed               This Visit's Progress     Care Coordination Activities- Medicaid (pt-stated)        Care Coordination Interventions: Social Work referral for [employer] Assessed social determinant of health barriers SW educated patient on [place]  Motivational Interviewing employed Solution-Focused Strategies employed:  Active listening / Reflection utilized  Emotional Support Provided Problem [place] strategies reviewed           Patient verbalizes understanding of instructions and care plan provided today and agrees to view in [place]. Active MyChart status and patient understanding of how to access instructions and care plan via MyChart confirmed with patient.     No further follow up required: .  Milus Height, Arita Miss, MSW, [place]  Social Worker [facility] Care Management  [phone]

## 2022-11-12 ENCOUNTER — Other Ambulatory Visit: Payer: Self-pay

## 2022-11-12 DIAGNOSIS — G8929 Other chronic pain: Secondary | ICD-10-CM

## 2022-11-12 DIAGNOSIS — I10 Essential (primary) hypertension: Secondary | ICD-10-CM

## 2022-11-12 DIAGNOSIS — G959 Disease of spinal cord, unspecified: Secondary | ICD-10-CM

## 2022-11-12 DIAGNOSIS — M4804 Spinal stenosis, thoracic region: Secondary | ICD-10-CM

## 2022-11-12 DIAGNOSIS — M48062 Spinal stenosis, lumbar region with neurogenic claudication: Secondary | ICD-10-CM

## 2022-11-12 DIAGNOSIS — M47812 Spondylosis without myelopathy or radiculopathy, cervical region: Secondary | ICD-10-CM

## 2022-11-12 DIAGNOSIS — G5731 Lesion of lateral popliteal nerve, right lower limb: Secondary | ICD-10-CM

## 2022-11-12 DIAGNOSIS — M159 Polyosteoarthritis, unspecified: Secondary | ICD-10-CM

## 2022-11-12 DIAGNOSIS — M160 Bilateral primary osteoarthritis of hip: Secondary | ICD-10-CM

## 2022-11-12 DIAGNOSIS — G629 Polyneuropathy, unspecified: Secondary | ICD-10-CM

## 2022-11-12 DIAGNOSIS — M170 Bilateral primary osteoarthritis of knee: Secondary | ICD-10-CM

## 2022-11-12 NOTE — Telephone Encounter (Signed)
Patient's daughter calls nurse line regarding rx refill on hydrocodone-acetaminophen.   Called pharmacy. Verified that they do have stock of hydrocodone-acetaminophen 10-325 mg.   Forwarding request to PCP.   Talbot Grumbling, RN

## 2022-11-13 MED ORDER — HYDROCODONE-ACETAMINOPHEN 10-325 MG PO TABS
1.0000 | ORAL_TABLET | Freq: Four times a day (QID) | ORAL | 0 refills | Status: DC | PRN
Start: 2022-11-13 — End: 2022-12-12

## 2022-11-13 MED ORDER — FUROSEMIDE 20 MG PO TABS
20.0000 mg | ORAL_TABLET | Freq: Every day | ORAL | 3 refills | Status: DC | PRN
Start: 2022-11-13 — End: 2024-08-27

## 2022-11-13 NOTE — Addendum Note (Signed)
Addended by: Lissa Morales D on: 11/13/2022 09:35 AM   Modules accepted: Orders

## 2022-11-29 ENCOUNTER — Encounter: Payer: Self-pay | Admitting: Family Medicine

## 2022-11-29 ENCOUNTER — Ambulatory Visit (INDEPENDENT_AMBULATORY_CARE_PROVIDER_SITE_OTHER): Payer: Medicare Other | Admitting: Family Medicine

## 2022-11-29 VITALS — BP 128/74 | HR 62 | Ht 59.0 in | Wt 144.8 lb

## 2022-11-29 DIAGNOSIS — I10 Essential (primary) hypertension: Secondary | ICD-10-CM

## 2022-11-29 DIAGNOSIS — I4819 Other persistent atrial fibrillation: Secondary | ICD-10-CM

## 2022-11-29 DIAGNOSIS — R42 Dizziness and giddiness: Secondary | ICD-10-CM | POA: Diagnosis not present

## 2022-11-29 NOTE — Patient Instructions (Signed)
Your dizziness may be because your heart rate is low from the metoprolol medication.    Lets try taking just a half a tablet of metoprolol daily and see if the dizziness decreases.   We will need to make sure your heart rate does not go too high and that your blood pressure does not go too high, too.    Keep taking the pill that is a combination of lorsartan 100 mg with HCTZ 25 mg. Continue taking ne of these pills a day.    I would like to see you on 12/20 to see how you are doing.

## 2022-11-30 ENCOUNTER — Encounter: Payer: Self-pay | Admitting: Family Medicine

## 2022-11-30 LAB — CBC
Hematocrit: 32.1 % — ABNORMAL LOW (ref 34.0–46.6)
Hemoglobin: 10.6 g/dL — ABNORMAL LOW (ref 11.1–15.9)
MCH: 27.7 pg (ref 26.6–33.0)
MCHC: 33 g/dL (ref 31.5–35.7)
MCV: 84 fL (ref 79–97)
Platelets: 206 10*3/uL (ref 150–450)
RBC: 3.83 x10E6/uL (ref 3.77–5.28)
RDW: 15.4 % (ref 11.7–15.4)
WBC: 4.1 10*3/uL (ref 3.4–10.8)

## 2022-11-30 NOTE — Assessment & Plan Note (Signed)
Established problem that has improved and has meet goal of <140/90.  Patient having symptoms of dizziness (lightheadedness) with ambulation.  No syncope/near syncope/vertigo>  Recent increase of losartan-HCTZ to two tablet daily from one daily.   Recommend  Decrease metoprolol XL 50 mg tablet, one daily, to half-tablet (25 mg) daily. Continue Losartan-HCTZ two tablet daily RTC one week.

## 2022-11-30 NOTE — Progress Notes (Signed)
Nicole Perez is accompanied by daughter, [name] Sources of clinical information for visit is/are patient and relative(s). Nursing assessment for this office visit was reviewed with the patient for accuracy and revision.     Previous Report(s) Reviewed: none    11/29/2022   10:52 AM  Depression screen PHQ 2/9  Decreased Interest 0  Down, Depressed, Hopeless 0  PHQ - 2 Score 0  Altered sleeping 0  Tired, decreased energy 0  Change in appetite 0  Feeling bad or failure about yourself  0  Trouble concentrating 0  Moving slowly or fidgety/restless 0  Suicidal thoughts 0  PHQ-9 Score 0  Difficult doing work/chores Not difficult at all   [place] Visit from 11/29/2022 in [place] Office Visit from 10/25/2022 in [place] Office Visit from 09/13/2022 in [place]  Thoughts that you would be better off dead, or of hurting yourself in some way Not at all Not at all Not at all  PHQ-9 Total Score 0 0 2          11/29/2022   10:53 AM 11/29/2022   10:51 AM 10/25/2022   11:03 AM 10/04/2022    3:00 PM 09/13/2022   11:08 AM  [place] in the past year? 0 0 0 0 0  Number falls in past yr: 0 0 0 0 0  Injury with Fall? 0 0 0 0 0       11/29/2022   10:52 AM 10/25/2022   11:03 AM 09/13/2022   11:08 AM  PHQ9 SCORE ONLY  PHQ-9 Total Score 0 0 2    There are no preventive care reminders to display for this patient.  Health Maintenance Due  Topic Date Due   Medicare Annual Wellness (AWV)  10/02/2018   DTaP/Tdap/Td (2 - Tdap) 07/10/2020   COVID-19 Vaccine (5 - 2023-24 season) 08/17/2022      History/P.E. limitations: hard of hearing  There are no preventive care reminders to display for this patient. There are no preventive care reminders to display for this patient.  Health Maintenance Due  Topic Date Due   Medicare Annual Wellness (AWV)  10/02/2018   DTaP/Tdap/Td (2 - Tdap)  07/10/2020   COVID-19 Vaccine (5 - 2023-24 season) 08/17/2022     Chief Complaint  Patient presents with   Dizziness   Hypertension     --------------------------------------------------------------------------------------------------------------------------------------------- Visit Problem List with A/P  No problem-specific Assessment & Plan notes found for this encounter.

## 2022-11-30 NOTE — Assessment & Plan Note (Signed)
Established problem. Stable.  Not anticoagulated because of history severe GI bleeding requiring transfer to [facility].  No lung crackles, no new leg edema Concern that Heart rate may be suppressed to point of causing dizziness with ambulation and fatigue at rest.   Decrease Metoprolol XL 50 tablet to 1/2 tablet (25 mg) daily.  RTC next Wednesday to assess heart rate and BP

## 2022-12-05 ENCOUNTER — Ambulatory Visit (INDEPENDENT_AMBULATORY_CARE_PROVIDER_SITE_OTHER): Payer: Medicare Other | Admitting: Family Medicine

## 2022-12-05 VITALS — BP 120/80 | HR 84

## 2022-12-05 DIAGNOSIS — G63 Polyneuropathy in diseases classified elsewhere: Secondary | ICD-10-CM

## 2022-12-05 DIAGNOSIS — M25361 Other instability, right knee: Secondary | ICD-10-CM

## 2022-12-05 DIAGNOSIS — M48062 Spinal stenosis, lumbar region with neurogenic claudication: Secondary | ICD-10-CM | POA: Diagnosis not present

## 2022-12-05 DIAGNOSIS — G894 Chronic pain syndrome: Secondary | ICD-10-CM | POA: Diagnosis not present

## 2022-12-05 DIAGNOSIS — M21371 Foot drop, right foot: Secondary | ICD-10-CM | POA: Diagnosis not present

## 2022-12-05 DIAGNOSIS — R2681 Unsteadiness on feet: Secondary | ICD-10-CM | POA: Diagnosis not present

## 2022-12-05 NOTE — Assessment & Plan Note (Addendum)
Established problem Uncontrolled.   After further clarification, Ms Camero complaint is not dizziness as usually interpreted in clinical medicine, but it is a sense of standing "wobbliness," or "loss of balance." When she stands, her right foot "rolls under" and her right knee "gives way."  She has to hold onto stable objects to keep from falling when standing.   She reports inability to move her right foot toes up or down and inability to move her right ankle up or down.   She not infrequently must lift her right leg with her hands to help the foot clear the floor.   Exam Right knee: Overt genu varum, visible anterior tibial subluxation with flexion/extension of knee. Palpable pronounce snap over knee joint with Flex/Ext.  Able to translate tibia posteriorly with posterior force applied to proximal tibia  Right ankle: overt excess supination at rest,  able to actively place ankle into neutral position, but patient unable to maintain in this postion, ankle reverting to the excess inversion position despite patient's active attempt to maintain the neutral position.   Right ankle/foot ext neuro: 3/5 ankle inversion/eversion, 3/5 ankle flex/ext, 3/5 Grt toe flex/ext, 4/5 monofilament sensation intact, decreased muscle tone extrinsic muscles of foot/ankle.   Right knee neuro: 4/5 knee flexion/extension.  Posture: eratic full body motion when transitioning sitting to standing, with falling of body to right.  Patient grasping at furniture and walker druing transition.    PM/SH:  S/P L4-5 Laminectomy (1987) for decompression of spinal stenosis  Right foot drop present since at least 2014 along with burning pain and aching pain in right foot. She had EMG and nerve conduction studies in Dr. Debroah Loop office (Ortho) on 10/20/2013 which showed and decreased right peroneal nerve function and evidence of superimposed chronic lumbar radiculopathy affecting L4, L5 and potentially S1 on the right.  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 Dr Rexene Alberts (Neuro) (11/2013) thought gait difficulty and back pain is a result from her severe degenerative back disease   Patient seen in hospital in April 2018 by her orthopedist who had previously seen her for her back disorder, Dr Jerilynn Mages. Dumonski (Ortho).  He thought the patient was not a candidate for surgery.        Lumbar Spine MRI 06/2021 (compared to 04/2017 LS MRI) showed  T11-T12: severe canal stenosis with mass effect on the cord, which appears progressed from the prior Multilevel severe canal stenosis,progressed at T11-T12, T12-L1 and L1-L2.  Severe canal stenosis at L2-L3 and L3-L4 (particularly severe at L3-L4) appears similar.  Mass effect on the cord and cauda equina at multiple levels.  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  2.   Unstable Right knee with knee actions like a severe cruciate ligament tear 3.   Flaccid, partial, neurogenic paresis of the right ankle and right knee ligamentous instability combining to cause impaired standing and ambulatory balance.   P/ Referral to Physical Therapy for consideration of right knee and right ankle bracing and accompanying right leg rehabilitation.

## 2022-12-05 NOTE — Patient Instructions (Signed)
I am concerned that your feeling of wobbliness is coming from your unstable right ankle and knee.   This is very likely from your compressed nerves in your back spine.    I recommend that we get you braces for yor right leg and ankle so you can get up without feeling wobbly.  Physical Therapy will help [place] decide if a brace would help and then fit you for a brace.    Think about whether or not you would want to try a long-acting morphine medication called MS Contin.  You would take it twice a day to help control the chronic pain in your leg, then use the hydrocodone when you have breakthrough pain needing immediate relief.

## 2022-12-05 NOTE — Progress Notes (Signed)
Nicole Perez is accompanied by daughter, Nicole Perez Sources of clinical information for Perez is/are patient, relative(s), and past medical records. Nursing assessment for this office Perez was reviewed with the patient for accuracy and revision.     Previous Report(s) Reviewed: Review of DC summary from  05/01/22 to read Nicole Bob, MD (Ortho) inpt consult note and review of  07/06/22 Lumbar spine MRI report    11/29/2022   10:52 AM  Depression screen PHQ 2/9  Decreased Interest 0  Down, Depressed, Hopeless 0  PHQ - 2 Score 0  Altered sleeping 0  Tired, decreased energy 0  Change in appetite 0  Feeling bad or failure about yourself  0  Trouble concentrating 0  Moving slowly or fidgety/restless 0  Suicidal thoughts 0  PHQ-9 Score 0  Difficult doing work/chores Not difficult at all   Nicole Perez from 11/29/2022 in [place] [facility] Perez from 10/25/2022 in [place] [facility] Perez from 09/13/2022 in [place]  Thoughts that you would be better off dead, or of hurting yourself in some way Not at all Not at all Not at all  PHQ-9 Total Score 0 0 2          11/29/2022   10:53 AM 11/29/2022   10:51 AM 10/25/2022   11:03 AM 10/04/2022    3:00 PM 09/13/2022   11:08 AM  Nicole Perez in the past year? 0 0 0 0 0  Number falls in past yr: 0 0 0 0 0  Injury with Fall? 0 0 0 0 0       11/29/2022   10:52 AM 10/25/2022   11:03 AM 09/13/2022   11:08 AM  PHQ9 SCORE ONLY  PHQ-9 Total Score 0 0 2    There are no preventive care reminders to display for this patient.  Health Maintenance Due  Topic Date Due   Medicare Annual Wellness (AWV)  10/02/2018   DTaP/Tdap/Td (2 - Tdap) 07/10/2020   COVID-19 Vaccine (5 - 2023-24 season) 08/17/2022      History/P.E. limitations: HOH  There are no preventive care reminders to display for this patient. There are no preventive care reminders to  display for this patient.  Health Maintenance Due  Topic Date Due   Medicare Annual Wellness (AWV)  10/02/2018   DTaP/Tdap/Td (2 - Tdap) 07/10/2020   COVID-19 Vaccine (5 - 2023-24 season) 08/17/2022     Chief Complaint  Patient presents with   Dizziness    Sit BP 120/80 P84 irr Standing immediate BP 180/115 P108 irr --------------------------------------------------------------------------------------------------------------------------------------------- Perez Problem List with A/P  Unsteady Established problem Uncontrolled.   After further clarification, Nicole Perez complaint is not dizziness as usually interpreted in clinical medicine, but it is a sense of standing "wobbliness," or "loss of balance." When she stands, her right foot "rolls under" and her right knee "gives way."  She has to hold onto stable objects to keep from falling when standing.   She reports inability to move her right foot toes up or down and inability to move her right ankle up or down.   She not infrequently must lift her right leg with her hands to help the foot clear the floor.   Exam Right knee: Overt genu varum, visible anterior tibial subluxation with flexion/extension of knee. Palpable pronounce snap over knee joint with Flex/Ext.  Able to translate tibia posteriorly with posterior force applied to proximal tibia  Right ankle: overt excess supination at rest,  able to actively place ankle into neutral position, but patient unable to maintain in this postion, ankle reverting to the excess inversion position despite patient's active attempt to maintain the neutral position.   Right ankle/foot ext neuro: 3/5 ankle inversion/eversion, 3/5 ankle flex/ext, 3/5 Grt toe flex/ext, 4/5 monofilament sensation intact, decreased muscle tone extrinsic muscles of foot/ankle.   Right knee neuro: 4/5 knee flexion/extension.  Posture: eratic full body motion when transitioning sitting to standing, with falling of body to  right.  Patient grasping at furniture and walker druing transition.    PM/SH:  S/P L4-5 Laminectomy (1987) for decompression of spinal stenosis  Right foot drop present since at least 2014 along with burning pain and aching pain in right foot. She had EMG and nerve conduction studies in Dr. Debroah Perez office (Ortho) on 10/20/2013 which showed and decreased right peroneal nerve function and evidence of superimposed chronic lumbar radiculopathy affecting L4, L5 and potentially S1 on the right.  Known neurogenic bladder due length-dependent axonal sensorimotor polyneuropathy bilaterally on 11/2013 ENG/EMG [facility]). Dr Nicole Perez (Neuro) (11/2013) thought gait difficulty and back pain is a result from her severe degenerative back disease   Patient seen in hospital in April 2018 by her orthopedist who had previously seen her for her back disorder, Dr Nicole Perez. Nicole Perez (Ortho).  He thought the patient was not a candidate for surgery.        Lumbar Spine MRI 06/2021 (compared to 04/2017 LS MRI) showed  T11-T12: severe canal stenosis with mass effect on the cord, which appears progressed from the prior Multilevel severe canal stenosis,progressed at T11-T12, T12-L1 and L1-L2.  Severe canal stenosis at L2-L3 and L3-L4 (particularly severe at L3-L4) appears similar.  Mass effect on the cord and cauda equina at multiple levels.  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  2.   Unstable Right knee with knee actions like a severe cruciate ligament tear 3.   Flaccid, partial, neurogenic paresis of the right ankle and right knee ligamentous instability combining to cause impaired standing and ambulatory balance.   P/ Referral to Physical Therapy for consideration of right knee and right ankle bracing and accompanying right leg rehabilitation.

## 2022-12-07 ENCOUNTER — Encounter: Payer: Self-pay | Admitting: Family Medicine

## 2022-12-07 DIAGNOSIS — M25361 Other instability, right knee: Secondary | ICD-10-CM | POA: Insufficient documentation

## 2022-12-07 NOTE — Assessment & Plan Note (Signed)
Established problem worsened.  Patient is not at goal of ability to perform ADLs due to pain Patient taking her 10 mg HC just about every 6 hours We discussed use of a long-acting opioid with HC 5 mg as her breakthrough. 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   Patient sent to see if bracing and Physical Therapy will help remediate some of the pain.  We will discuss use of long-acting opioid at next office visit.

## 2022-12-12 ENCOUNTER — Other Ambulatory Visit: Payer: Self-pay

## 2022-12-12 DIAGNOSIS — G959 Disease of spinal cord, unspecified: Secondary | ICD-10-CM

## 2022-12-12 DIAGNOSIS — M4804 Spinal stenosis, thoracic region: Secondary | ICD-10-CM

## 2022-12-12 DIAGNOSIS — M47812 Spondylosis without myelopathy or radiculopathy, cervical region: Secondary | ICD-10-CM

## 2022-12-12 DIAGNOSIS — G5731 Lesion of lateral popliteal nerve, right lower limb: Secondary | ICD-10-CM

## 2022-12-12 DIAGNOSIS — G629 Polyneuropathy, unspecified: Secondary | ICD-10-CM

## 2022-12-12 DIAGNOSIS — M170 Bilateral primary osteoarthritis of knee: Secondary | ICD-10-CM

## 2022-12-12 DIAGNOSIS — M159 Polyosteoarthritis, unspecified: Secondary | ICD-10-CM

## 2022-12-12 DIAGNOSIS — M160 Bilateral primary osteoarthritis of hip: Secondary | ICD-10-CM

## 2022-12-12 DIAGNOSIS — M48062 Spinal stenosis, lumbar region with neurogenic claudication: Secondary | ICD-10-CM

## 2022-12-12 DIAGNOSIS — G8929 Other chronic pain: Secondary | ICD-10-CM

## 2022-12-12 MED ORDER — HYDROCODONE-ACETAMINOPHEN 10-325 MG PO TABS
1.0000 | ORAL_TABLET | Freq: Four times a day (QID) | ORAL | 0 refills | Status: DC | PRN
Start: 2022-12-12 — End: 2023-01-08

## 2022-12-13 ENCOUNTER — Other Ambulatory Visit: Payer: Self-pay | Admitting: Family Medicine

## 2022-12-13 DIAGNOSIS — I712 Thoracic aortic aneurysm, without rupture, unspecified: Secondary | ICD-10-CM

## 2022-12-13 DIAGNOSIS — I10 Essential (primary) hypertension: Secondary | ICD-10-CM

## 2023-01-02 ENCOUNTER — Other Ambulatory Visit: Payer: Self-pay | Admitting: Family Medicine

## 2023-01-02 ENCOUNTER — Ambulatory Visit (INDEPENDENT_AMBULATORY_CARE_PROVIDER_SITE_OTHER): Payer: Medicare Other | Admitting: Physical Therapy

## 2023-01-02 DIAGNOSIS — M79604 Pain in right leg: Secondary | ICD-10-CM

## 2023-01-02 DIAGNOSIS — N3020 Other chronic cystitis without hematuria: Secondary | ICD-10-CM

## 2023-01-02 DIAGNOSIS — M6281 Muscle weakness (generalized): Secondary | ICD-10-CM

## 2023-01-02 DIAGNOSIS — R2689 Other abnormalities of gait and mobility: Secondary | ICD-10-CM | POA: Diagnosis not present
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
## 2023-01-03 DIAGNOSIS — R339 Retention of urine, unspecified: Secondary | ICD-10-CM | POA: Diagnosis not present

## 2023-01-04 ENCOUNTER — Encounter: Payer: Self-pay | Admitting: Physical Therapy

## 2023-01-08 ENCOUNTER — Other Ambulatory Visit: Payer: Self-pay

## 2023-01-08 DIAGNOSIS — G8929 Other chronic pain: Secondary | ICD-10-CM

## 2023-01-08 DIAGNOSIS — G629 Polyneuropathy, unspecified: Secondary | ICD-10-CM

## 2023-01-08 DIAGNOSIS — G959 Disease of spinal cord, unspecified: Secondary | ICD-10-CM

## 2023-01-08 DIAGNOSIS — G5731 Lesion of lateral popliteal nerve, right lower limb: Secondary | ICD-10-CM

## 2023-01-08 DIAGNOSIS — M160 Bilateral primary osteoarthritis of hip: Secondary | ICD-10-CM

## 2023-01-08 DIAGNOSIS — M170 Bilateral primary osteoarthritis of knee: Secondary | ICD-10-CM

## 2023-01-08 DIAGNOSIS — M159 Polyosteoarthritis, unspecified: Secondary | ICD-10-CM

## 2023-01-08 DIAGNOSIS — M48062 Spinal stenosis, lumbar region with neurogenic claudication: Secondary | ICD-10-CM

## 2023-01-08 DIAGNOSIS — M47812 Spondylosis without myelopathy or radiculopathy, cervical region: Secondary | ICD-10-CM

## 2023-01-08 DIAGNOSIS — M4804 Spinal stenosis, thoracic region: Secondary | ICD-10-CM

## 2023-01-08 MED ORDER — HYDROCODONE-ACETAMINOPHEN 10-325 MG PO TABS: 1.0000 | ORAL_TABLET | Freq: Four times a day (QID) | ORAL | 0 refills | Status: DC | PRN

## 2023-01-17 ENCOUNTER — Encounter: Payer: Self-pay | Admitting: Physical Therapy

## 2023-01-17 ENCOUNTER — Ambulatory Visit: Payer: Medicare Other | Admitting: Physical Therapy

## 2023-01-17 DIAGNOSIS — M79604 Pain in right leg: Secondary | ICD-10-CM | POA: Diagnosis not present

## 2023-01-17 DIAGNOSIS — R2689 Other abnormalities of gait and mobility: Secondary | ICD-10-CM | POA: Diagnosis not present

## 2023-01-17 DIAGNOSIS — M6281 Muscle weakness (generalized): Secondary | ICD-10-CM | POA: Diagnosis not present

## 2023-01-17 NOTE — Therapy (Signed)
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 Cryotherapy, Moist heat, Taping, Traction, Ultrasound, Ionotophoresis '4mg'$ /ml Dexamethasone, and Manual therapy  PLAN FOR NEXT SESSION:    Lyndee Hensen, PT, DPT 4:15 PM  01/17/23

## 2023-01-18 ENCOUNTER — Other Ambulatory Visit: Payer: Self-pay | Admitting: Family Medicine

## 2023-01-18 DIAGNOSIS — M21371 Foot drop, right foot: Secondary | ICD-10-CM

## 2023-01-18 NOTE — Progress Notes (Signed)
Order for AFO sent.

## 2023-01-30 ENCOUNTER — Ambulatory Visit (INDEPENDENT_AMBULATORY_CARE_PROVIDER_SITE_OTHER): Payer: Medicare Other | Admitting: Physical Therapy

## 2023-01-30 ENCOUNTER — Encounter: Payer: Self-pay | Admitting: Physical Therapy

## 2023-01-30 DIAGNOSIS — M79604 Pain in right leg: Secondary | ICD-10-CM | POA: Diagnosis not present

## 2023-01-30 DIAGNOSIS — M6281 Muscle weakness (generalized): Secondary | ICD-10-CM | POA: Diagnosis not present

## 2023-01-30 DIAGNOSIS — R2689 Other abnormalities of gait and mobility: Secondary | ICD-10-CM | POA: Diagnosis not present
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
## 2023-01-31 ENCOUNTER — Telehealth: Payer: Self-pay

## 2023-01-31 DIAGNOSIS — N3020 Other chronic cystitis without hematuria: Secondary | ICD-10-CM

## 2023-01-31 DIAGNOSIS — N390 Urinary tract infection, site not specified: Secondary | ICD-10-CM

## 2023-01-31 MED ORDER — DOXYCYCLINE HYCLATE 100 MG PO TABS: 100.0000 mg | ORAL_TABLET | Freq: Two times a day (BID) | ORAL | 3 refills | Status: DC

## 2023-01-31 NOTE — Telephone Encounter (Signed)
Please let daughter know antibiotic was called into pharmacy.  Nicole Perez should be seen byn a provider if she fails to improve within three days of starting antibiotic or if her condition worsens.,

## 2023-01-31 NOTE — Telephone Encounter (Signed)
Informed patient daughter that the patient antibiotic was called into the pharmacy and also informed her mother does not improve within 3 days then please schedule a doctors visit.

## 2023-01-31 NOTE — Addendum Note (Signed)
Addended byLissa Morales D on: 01/31/2023 12:32 PM   Modules accepted: Orders

## 2023-01-31 NOTE — Telephone Encounter (Signed)
Daughter calls nurse line requesting antibiotic for UTI.   Daughter catheterizes patient. Daughter reports dark urine and slight odor.   Denies fever, chills or abdominal pain.    Advised that PCP had sent prescription with three refills on 01/03/23.  Daughter will contact pharmacy.   Red flags discussed.   Talbot Grumbling, RN

## 2023-02-06 ENCOUNTER — Other Ambulatory Visit: Payer: Self-pay

## 2023-02-06 DIAGNOSIS — M48062 Spinal stenosis, lumbar region with neurogenic claudication: Secondary | ICD-10-CM

## 2023-02-06 DIAGNOSIS — M170 Bilateral primary osteoarthritis of knee: Secondary | ICD-10-CM

## 2023-02-06 DIAGNOSIS — G5731 Lesion of lateral popliteal nerve, right lower limb: Secondary | ICD-10-CM

## 2023-02-06 DIAGNOSIS — M159 Polyosteoarthritis, unspecified: Secondary | ICD-10-CM

## 2023-02-06 DIAGNOSIS — M150 Primary generalized (osteo)arthritis: Secondary | ICD-10-CM

## 2023-02-06 DIAGNOSIS — M4804 Spinal stenosis, thoracic region: Secondary | ICD-10-CM

## 2023-02-06 DIAGNOSIS — G959 Disease of spinal cord, unspecified: Secondary | ICD-10-CM

## 2023-02-06 DIAGNOSIS — G8929 Other chronic pain: Secondary | ICD-10-CM

## 2023-02-06 DIAGNOSIS — M160 Bilateral primary osteoarthritis of hip: Secondary | ICD-10-CM

## 2023-02-06 DIAGNOSIS — M47812 Spondylosis without myelopathy or radiculopathy, cervical region: Secondary | ICD-10-CM

## 2023-02-06 DIAGNOSIS — G629 Polyneuropathy, unspecified: Secondary | ICD-10-CM

## 2023-02-07 MED ORDER — HYDROCODONE-ACETAMINOPHEN 10-325 MG PO TABS: 1.0000 | ORAL_TABLET | Freq: Four times a day (QID) | ORAL | 0 refills | Status: DC | PRN

## 2023-02-22 ENCOUNTER — Telehealth: Payer: Self-pay | Admitting: Family Medicine

## 2023-02-22 NOTE — Telephone Encounter (Signed)
Called patient to schedule Medicare Annual Wellness Visit (AWV). Left message for patient to call back and schedule Medicare Annual Wellness Visit (AWV).  Last date of AWV: AWVI eligible as of 12/17/2009   Please schedule an appointment at any time with Kelita, NHA.  If any questions, please contact me at [phone].    Thank you,  Carrie  [facility] Group Direct dial  [phone]  

## 2023-02-27 ENCOUNTER — Ambulatory Visit: Payer: Medicare Other | Admitting: Physical Therapy

## 2023-02-27 ENCOUNTER — Encounter: Payer: Self-pay | Admitting: Physical Therapy

## 2023-02-27 DIAGNOSIS — M6281 Muscle weakness (generalized): Secondary | ICD-10-CM | POA: Diagnosis not present

## 2023-02-27 DIAGNOSIS — M79604 Pain in right leg: Secondary | ICD-10-CM | POA: Diagnosis not present

## 2023-02-27 DIAGNOSIS — R2689 Other abnormalities of gait and mobility: Secondary | ICD-10-CM | POA: Diagnosis not present

## 2023-02-27 NOTE — Therapy (Addendum)
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 therapy  PLAN FOR NEXT SESSION:    Leotis Shames  Noralyn Pick, PT, DPT 12:58 PM  02/27/23  PHYSICAL THERAPY DISCHARGE SUMMARY  Visits from Start of Care: 4   Plan: Patient agrees to discharge.  Patient goals were partially met. Patient is being discharged due to  - Pt did not return after last visit, pt returned to MD.     Sedalia Muta, PT, DPT 1:47 PM  07/18/23

## 2023-02-28 ENCOUNTER — Encounter: Payer: Self-pay | Admitting: Orthopedic Surgery

## 2023-02-28 ENCOUNTER — Ambulatory Visit: Payer: Medicare Other | Admitting: Orthopedic Surgery

## 2023-02-28 DIAGNOSIS — M25561 Pain in right knee: Secondary | ICD-10-CM

## 2023-02-28 DIAGNOSIS — G8929 Other chronic pain: Secondary | ICD-10-CM | POA: Diagnosis not present

## 2023-02-28 DIAGNOSIS — I739 Peripheral vascular disease, unspecified: Secondary | ICD-10-CM | POA: Diagnosis not present

## 2023-02-28 DIAGNOSIS — M21371 Foot drop, right foot: Secondary | ICD-10-CM
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
## 2023-03-07 ENCOUNTER — Other Ambulatory Visit: Payer: Self-pay

## 2023-03-07 DIAGNOSIS — M159 Polyosteoarthritis, unspecified: Secondary | ICD-10-CM

## 2023-03-07 DIAGNOSIS — G629 Polyneuropathy, unspecified: Secondary | ICD-10-CM

## 2023-03-07 DIAGNOSIS — M4804 Spinal stenosis, thoracic region: Secondary | ICD-10-CM

## 2023-03-07 DIAGNOSIS — M47812 Spondylosis without myelopathy or radiculopathy, cervical region: Secondary | ICD-10-CM

## 2023-03-07 DIAGNOSIS — G5731 Lesion of lateral popliteal nerve, right lower limb: Secondary | ICD-10-CM

## 2023-03-07 DIAGNOSIS — G8929 Other chronic pain: Secondary | ICD-10-CM

## 2023-03-07 DIAGNOSIS — G959 Disease of spinal cord, unspecified: Secondary | ICD-10-CM

## 2023-03-07 DIAGNOSIS — M170 Bilateral primary osteoarthritis of knee: Secondary | ICD-10-CM

## 2023-03-07 DIAGNOSIS — M150 Primary generalized (osteo)arthritis: Secondary | ICD-10-CM

## 2023-03-07 DIAGNOSIS — M48062 Spinal stenosis, lumbar region with neurogenic claudication: Secondary | ICD-10-CM

## 2023-03-07 DIAGNOSIS — M160 Bilateral primary osteoarthritis of hip: Secondary | ICD-10-CM

## 2023-03-07 MED ORDER — HYDROCODONE-ACETAMINOPHEN 10-325 MG PO TABS: 1.0000 | ORAL_TABLET | Freq: Four times a day (QID) | ORAL | 0 refills | Status: DC | PRN

## 2023-03-13 ENCOUNTER — Encounter: Payer: Medicare Other | Admitting: Physical Therapy

## 2023-03-27 ENCOUNTER — Ambulatory Visit (HOSPITAL_COMMUNITY)
Admission: RE | Admit: 2023-03-27 | Discharge: 2023-03-27 | Disposition: A | Discharge status: Home / Self Care | Payer: Medicare Other | Source: Ambulatory Visit | Attending: Vascular Surgery | Admitting: Vascular Surgery

## 2023-03-27 ENCOUNTER — Ambulatory Visit: Payer: Medicare Other | Admitting: Physician Assistant

## 2023-03-27 VITALS — BP 154/99 | HR 101 | Temp 98.0°F | Wt 150.0 lb

## 2023-03-27 DIAGNOSIS — I739 Peripheral vascular disease, unspecified: Secondary | ICD-10-CM | POA: Insufficient documentation

## 2023-03-27 DIAGNOSIS — M21371 Foot drop, right foot: Secondary | ICD-10-CM

## 2023-03-27 DIAGNOSIS — I872 Venous insufficiency (chronic) (peripheral): Secondary | ICD-10-CM

## 2023-03-27 LAB — VAS US ABI WITH/WO TBI
Left ABI: 1.1
Right ABI: 1.04
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
## 2023-04-03 ENCOUNTER — Other Ambulatory Visit: Payer: Self-pay

## 2023-04-03 DIAGNOSIS — M150 Primary generalized (osteo)arthritis: Secondary | ICD-10-CM

## 2023-04-03 DIAGNOSIS — G5731 Lesion of lateral popliteal nerve, right lower limb: Secondary | ICD-10-CM

## 2023-04-03 DIAGNOSIS — M159 Polyosteoarthritis, unspecified: Secondary | ICD-10-CM

## 2023-04-03 DIAGNOSIS — G959 Disease of spinal cord, unspecified: Secondary | ICD-10-CM

## 2023-04-03 DIAGNOSIS — G629 Polyneuropathy, unspecified: Secondary | ICD-10-CM

## 2023-04-03 DIAGNOSIS — M47812 Spondylosis without myelopathy or radiculopathy, cervical region: Secondary | ICD-10-CM

## 2023-04-03 DIAGNOSIS — M48062 Spinal stenosis, lumbar region with neurogenic claudication: Secondary | ICD-10-CM

## 2023-04-03 DIAGNOSIS — G8929 Other chronic pain: Secondary | ICD-10-CM

## 2023-04-03 DIAGNOSIS — M160 Bilateral primary osteoarthritis of hip: Secondary | ICD-10-CM

## 2023-04-03 DIAGNOSIS — M4804 Spinal stenosis, thoracic region: Secondary | ICD-10-CM

## 2023-04-03 DIAGNOSIS — M170 Bilateral primary osteoarthritis of knee: Secondary | ICD-10-CM

## 2023-04-04 MED ORDER — HYDROCODONE-ACETAMINOPHEN 10-325 MG PO TABS: 1.0000 | ORAL_TABLET | Freq: Four times a day (QID) | ORAL | 0 refills | Status: DC | PRN

## 2023-04-15 DIAGNOSIS — M21371 Foot drop, right foot: Secondary | ICD-10-CM | POA: Diagnosis not present

## 2023-04-29 ENCOUNTER — Other Ambulatory Visit: Payer: Self-pay

## 2023-04-29 DIAGNOSIS — G959 Disease of spinal cord, unspecified: Secondary | ICD-10-CM

## 2023-04-29 DIAGNOSIS — M4804 Spinal stenosis, thoracic region: Secondary | ICD-10-CM

## 2023-04-29 DIAGNOSIS — M159 Polyosteoarthritis, unspecified: Secondary | ICD-10-CM

## 2023-04-29 DIAGNOSIS — G5731 Lesion of lateral popliteal nerve, right lower limb: Secondary | ICD-10-CM

## 2023-04-29 DIAGNOSIS — G629 Polyneuropathy, unspecified: Secondary | ICD-10-CM

## 2023-04-29 DIAGNOSIS — G8929 Other chronic pain: Secondary | ICD-10-CM

## 2023-04-29 DIAGNOSIS — M160 Bilateral primary osteoarthritis of hip: Secondary | ICD-10-CM

## 2023-04-29 DIAGNOSIS — M47812 Spondylosis without myelopathy or radiculopathy, cervical region: Secondary | ICD-10-CM

## 2023-04-29 DIAGNOSIS — M48062 Spinal stenosis, lumbar region with neurogenic claudication: Secondary | ICD-10-CM

## 2023-04-29 DIAGNOSIS — M170 Bilateral primary osteoarthritis of knee: Secondary | ICD-10-CM

## 2023-04-29 MED ORDER — HYDROCODONE-ACETAMINOPHEN 10-325 MG PO TABS: 1.0000 | ORAL_TABLET | Freq: Four times a day (QID) | ORAL | 0 refills | Status: AC | PRN

## 2023-05-30 ENCOUNTER — Ambulatory Visit (INDEPENDENT_AMBULATORY_CARE_PROVIDER_SITE_OTHER): Payer: Medicare Other | Admitting: Family Medicine

## 2023-05-30 ENCOUNTER — Encounter: Payer: Self-pay | Admitting: Family Medicine

## 2023-05-30 VITALS — BP 128/83 | HR 100 | Temp 98.2°F | Ht 59.0 in | Wt 145.0 lb

## 2023-05-30 DIAGNOSIS — M170 Bilateral primary osteoarthritis of knee: Secondary | ICD-10-CM | POA: Diagnosis not present

## 2023-05-30 DIAGNOSIS — I712 Thoracic aortic aneurysm, without rupture, unspecified: Secondary | ICD-10-CM

## 2023-05-30 DIAGNOSIS — M160 Bilateral primary osteoarthritis of hip: Secondary | ICD-10-CM | POA: Diagnosis not present

## 2023-05-30 DIAGNOSIS — I4819 Other persistent atrial fibrillation: Secondary | ICD-10-CM

## 2023-05-30 DIAGNOSIS — G63 Polyneuropathy in diseases classified elsewhere: Secondary | ICD-10-CM

## 2023-05-30 DIAGNOSIS — M5441 Lumbago with sciatica, right side: Secondary | ICD-10-CM

## 2023-05-30 DIAGNOSIS — I10 Essential (primary) hypertension: Secondary | ICD-10-CM | POA: Diagnosis not present

## 2023-05-30 DIAGNOSIS — Z79899 Other long term (current) drug therapy: Secondary | ICD-10-CM

## 2023-05-30 DIAGNOSIS — D638 Anemia in other chronic diseases classified elsewhere: Secondary | ICD-10-CM | POA: Diagnosis not present

## 2023-05-30 DIAGNOSIS — M47812 Spondylosis without myelopathy or radiculopathy, cervical region: Secondary | ICD-10-CM

## 2023-05-30 DIAGNOSIS — M4715 Other spondylosis with myelopathy, thoracolumbar region: Secondary | ICD-10-CM

## 2023-05-30 DIAGNOSIS — M461 Sacroiliitis, not elsewhere classified: Secondary | ICD-10-CM

## 2023-05-30 DIAGNOSIS — M48062 Spinal stenosis, lumbar region with neurogenic claudication: Secondary | ICD-10-CM | POA: Diagnosis not present

## 2023-05-30 DIAGNOSIS — M4804 Spinal stenosis, thoracic region: Secondary | ICD-10-CM | POA: Diagnosis not present

## 2023-05-30 DIAGNOSIS — M19071 Primary osteoarthritis, right ankle and foot: Secondary | ICD-10-CM | POA: Diagnosis not present

## 2023-05-30 DIAGNOSIS — J22 Unspecified acute lower respiratory infection: Secondary | ICD-10-CM

## 2023-05-30 DIAGNOSIS — G959 Disease of spinal cord, unspecified: Secondary | ICD-10-CM

## 2023-05-30 DIAGNOSIS — G8929 Other chronic pain: Secondary | ICD-10-CM

## 2023-05-30 DIAGNOSIS — N1831 Chronic kidney disease, stage 3a: Secondary | ICD-10-CM | POA: Diagnosis not present

## 2023-05-30 DIAGNOSIS — G629 Polyneuropathy, unspecified: Secondary | ICD-10-CM

## 2023-05-30 HISTORY — DX: Unspecified acute lower respiratory infection: J22

## 2023-05-30 MED ORDER — DOXYCYCLINE HYCLATE 100 MG PO TABS
100.0000 mg | ORAL_TABLET | Freq: Two times a day (BID) | ORAL | 0 refills | Status: AC
Start: 2023-05-30 — End: 2023-06-04

## 2023-05-30 MED ORDER — HYDROCODONE-ACETAMINOPHEN 10-325 MG PO TABS
1.0000 | ORAL_TABLET | ORAL | 0 refills | Status: AC | PRN
Start: 2023-05-30 — End: 2023-06-02

## 2023-05-30 MED ORDER — PREGABALIN 25 MG PO CAPS
25.0000 mg | ORAL_CAPSULE | Freq: Two times a day (BID) | ORAL | 0 refills | Status: DC
Start: 2023-05-30 — End: 2023-06-13

## 2023-05-30 NOTE — Assessment & Plan Note (Signed)
Established problem Plan Schedule the 1-year surveillance of known TAA (descending 4.2 cm) with Chest/Aorta CTA for July 2024 Checking serum creatinine today

## 2023-05-30 NOTE — Assessment & Plan Note (Signed)
Established problem Episodic flares of pain that prevent sleep.  She has had no significant adverse effects from [facility], She is able to complete her ADLs when she uses her HC-APAP. No aberrant behaviors, Adequate analgesia most ly, though will take an addition half-tablet of her HC-APAP occasionally for breakthrough pain.   She stopped gabapentin therapy because she thought it was causing her legs "to draw up".  She is willing to try a trial of Lyrica, which she took in past with success several years ago. Prescribed Lyrica 25 mg three times a day for a month.  Will follow up in a month.

## 2023-05-30 NOTE — Assessment & Plan Note (Signed)
New problem Cough productive of gray sputum, > a tbs  Onset 2-3 days ago Began in chest Increase DOE No fever, but having new night sweats.  Cor Irr irr HR 100 Lung faint bibasilar crackles, left > right  A/ Lower Respiratory Tract infection, possibly bacterial P/Doxycycline 100 mg twice a day x 5 days

## 2023-05-30 NOTE — Assessment & Plan Note (Signed)
Established problem. Adequate blood pressure control.  No evidence of new end organ damage.  Tolerating medication without significant adverse effects.  Plan to continue current blood pressure medication regiment.   

## 2023-05-30 NOTE — Assessment & Plan Note (Signed)
Established problem Irr Irr rhythm with HR 100 bpm History of severe UGI bleed with Dieulafoy lesion. No Anticoagulation  Plan Increase from metoprolol succ 25 daily to 50 mg daily RTC 3 weeks reassess rate control.

## 2023-05-30 NOTE — Progress Notes (Signed)
Nicole Perez is accompanied by daughter Sources of clinical information for visit is/are patient. Nursing assessment for this office visit was reviewed with the patient for accuracy and revision.     Previous Report(s) Reviewed: Chest CT radiology reports     11/29/2022   10:52 AM  Depression screen PHQ 2/9  Decreased Interest 0  Down, Depressed, Hopeless 0  PHQ - 2 Score 0  Altered sleeping 0  Tired, decreased energy 0  Change in appetite 0  Feeling bad or failure about yourself  0  Trouble concentrating 0  Moving slowly or fidgety/restless 0  Suicidal thoughts 0  PHQ-9 Score 0  Difficult doing work/chores Not difficult at all   [employer] Office Visit from 11/29/2022 in [facility] Office Visit from 10/25/2022 in [facility] Office Visit from 09/13/2022 in [facility]  Thoughts that you would be better off dead, or of hurting yourself in some way Not at all Not at all Not at all  PHQ-9 Total Score 0 0 2          05/30/2023    9:31 AM 11/29/2022   10:53 AM 11/29/2022   10:51 AM 10/25/2022   11:03 AM 10/04/2022    3:00 PM  Fall Risk   Falls in the past year? 0 0 0 0 0  Number falls in past yr: 0 0 0 0 0  Injury with Fall? 0 0 0 0 0       11/29/2022   10:52 AM 10/25/2022   11:03 AM 09/13/2022   11:08 AM  PHQ9 SCORE ONLY  PHQ-9 Total Score 0 0 2    There are no preventive care reminders to display for this patient.  Health Maintenance Due  Topic Date Due   Medicare Annual Wellness (AWV)  10/02/2018   DTaP/Tdap/Td (2 - Tdap) 07/10/2020      History/P.E. limitations: none  There are no preventive care reminders to display for this patient. There are no preventive care reminders to display for this patient.  Health Maintenance Due  Topic Date Due   Medicare Annual Wellness (AWV)  10/02/2018   DTaP/Tdap/Td (2 - Tdap) 07/10/2020     No chief complaint on file.     --------------------------------------------------------------------------------------------------------------------------------------------- Visit Problem List with A/P  No problem-specific Assessment & Plan notes found for this encounter.

## 2023-05-30 NOTE — Assessment & Plan Note (Addendum)
Established problem Checking CBC

## 2023-05-30 NOTE — Patient Instructions (Addendum)
Your will be contacted to schedule a Chest CT to follow up your aneurysm.   We refilled your pain medication.  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   Increase your Metoprolol to one whole tablet daily to decrease how fast your heart rate is going back to normal.   Take the Doxycycline antibiotic one tablet twice a day for 5 days to treat your chest infection.

## 2023-05-30 NOTE — Assessment & Plan Note (Signed)
Established problem Checking serum creatinine

## 2023-05-31 ENCOUNTER — Encounter: Payer: Self-pay | Admitting: Family Medicine

## 2023-05-31 LAB — CBC
Hematocrit: 34.2 % (ref 34.0–46.6)
Hemoglobin: 11 g/dL — ABNORMAL LOW (ref 11.1–15.9)
MCH: 28.6 pg (ref 26.6–33.0)
MCHC: 32.2 g/dL (ref 31.5–35.7)
MCV: 89 fL (ref 79–97)
Platelets: 258 10*3/uL (ref 150–450)
RBC: 3.84 x10E6/uL (ref 3.77–5.28)
RDW: 13.1 % (ref 11.7–15.4)
WBC: 5 10*3/uL (ref 3.4–10.8)

## 2023-05-31 LAB — BASIC METABOLIC PANEL
BUN/Creatinine Ratio: 29 — ABNORMAL HIGH (ref 12–28)
BUN: 34 mg/dL — ABNORMAL HIGH (ref 8–27)
CO2: 22 mmol/L (ref 20–29)
Calcium: 9.4 mg/dL (ref 8.7–10.3)
Chloride: 104 mmol/L (ref 96–106)
Creatinine, Ser: 1.18 mg/dL — ABNORMAL HIGH (ref 0.57–1.00)
Glucose: 126 mg/dL — ABNORMAL HIGH (ref 70–99)
Potassium: 4 mmol/L (ref 3.5–5.2)
Sodium: 142 mmol/L (ref 134–144)
eGFR: 44 mL/min/{1.73_m2} — ABNORMAL LOW (ref >59)

## 2023-06-02 DIAGNOSIS — R339 Retention of urine, unspecified: Secondary | ICD-10-CM | POA: Diagnosis not present

## 2023-06-12 ENCOUNTER — Encounter: Payer: Self-pay | Admitting: Family Medicine

## 2023-06-12 ENCOUNTER — Ambulatory Visit (INDEPENDENT_AMBULATORY_CARE_PROVIDER_SITE_OTHER): Payer: Medicare Other | Admitting: Family Medicine

## 2023-06-12 VITALS — BP 138/74 | HR 65 | Wt 150.0 lb

## 2023-06-12 DIAGNOSIS — G959 Disease of spinal cord, unspecified: Secondary | ICD-10-CM

## 2023-06-12 DIAGNOSIS — I10 Essential (primary) hypertension: Secondary | ICD-10-CM | POA: Diagnosis not present

## 2023-06-12 DIAGNOSIS — G63 Polyneuropathy in diseases classified elsewhere: Secondary | ICD-10-CM

## 2023-06-12 DIAGNOSIS — M4804 Spinal stenosis, thoracic region: Secondary | ICD-10-CM | POA: Diagnosis not present

## 2023-06-12 DIAGNOSIS — M48062 Spinal stenosis, lumbar region with neurogenic claudication: Secondary | ICD-10-CM | POA: Diagnosis not present

## 2023-06-12 DIAGNOSIS — I4819 Other persistent atrial fibrillation: Secondary | ICD-10-CM | POA: Diagnosis not present

## 2023-06-12 DIAGNOSIS — G629 Polyneuropathy, unspecified: Secondary | ICD-10-CM | POA: Diagnosis not present

## 2023-06-12 NOTE — Patient Instructions (Signed)
Please keep taking a half tablet of metoprolol every day   And keep taking a whole tablet of Losartan-hydrochlorothiazide tablet every day  Since the Pregabalin is making you sleepy, take just one capsule at bedtime.

## 2023-06-13 ENCOUNTER — Encounter: Payer: Self-pay | Admitting: Family Medicine

## 2023-06-13 NOTE — Assessment & Plan Note (Signed)
Established problem Irr, Irr.  Rate is 60 bpm Adequate rate control with Ms Gigante only taking half Toprol XL 50 mg daily. No anticoagulation given history of life-threatening GI bleed.   Continue current 25 mg dose of metoprolol succ daily

## 2023-06-13 NOTE — Progress Notes (Signed)
Nicole Perez is accompanied by daughter Sources of clinical information for visit is/are patient and relative(s). Nursing assessment for this office visit was reviewed with the patient for accuracy and revision.     Previous Report(s) Reviewed: none     06/12/2023    2:58 PM  Depression screen PHQ 2/9  Decreased Interest 0  Down, Depressed, Hopeless 0  PHQ - 2 Score 0  Altered sleeping 0  Tired, decreased energy 0  Change in appetite 0  Feeling bad or failure about yourself  0  Trouble concentrating 0  Moving slowly or fidgety/restless 0  Suicidal thoughts 0  PHQ-9 Score 0   Flowsheet Row Office Visit from 06/12/2023 in [facility] Office Visit from 11/29/2022 in [facility] Office Visit from 10/25/2022 in [facility]  Thoughts that you would be better off dead, or of hurting yourself in some way Not at all Not at all Not at all  PHQ-9 Total Score 0 0 0          06/12/2023    2:59 PM 05/30/2023    9:31 AM 11/29/2022   10:53 AM 11/29/2022   10:51 AM 10/25/2022   11:03 AM  Fall Risk   Falls in the past year? 0 0 0 0 0  Number falls in past yr: 0 0 0 0 0  Injury with Fall? 0 0 0 0 0  Risk for fall due to : Impaired mobility;Impaired balance/gait;Medication side effect      Follow up Falls prevention discussed           06/12/2023    2:58 PM 11/29/2022   10:52 AM 10/25/2022   11:03 AM  PHQ9 SCORE ONLY  PHQ-9 Total Score 0 0 0    There are no preventive care reminders to display for this patient.  Health Maintenance Due  Topic Date Due   Medicare Annual Wellness (AWV)  10/02/2018   DTaP/Tdap/Td (2 - Tdap) 07/10/2020      History/P.E. limitations: impaired hearing  There are no preventive care reminders to display for this patient. There are no preventive care reminders to display for this patient.  Health Maintenance Due  Topic Date Due   Medicare Annual Wellness (AWV)  10/02/2018   DTaP/Tdap/Td  (2 - Tdap) 07/10/2020     Chief Complaint  Patient presents with   Medication Management     --------------------------------------------------------------------------------------------------------------------------------------------- Visit Problem List with A/P  No problem-specific Assessment & Plan notes found for this encounter.

## 2023-06-13 NOTE — Assessment & Plan Note (Signed)
Established problem. Adequate blood pressure control.  No evidence of new end organ damage.  Tolerating medication without significant adverse effects.  Plan to continue current blood pressure medication regiment.   

## 2023-06-13 NOTE — Assessment & Plan Note (Signed)
Established problem Nicole Perez was sedated during the day with Lyrica 25 mg twice a day, so in last few days she has reduced dose to 25 mg at bedtime with reduction in daytime sleeepingess, and with improved pain reduction in legs/feet.  She is taking half tablet Norco prn breakthru pain.   Plan continue Lyrica at 25 mg at bedtime and 1/2 tab Norco daily prn breakthrough

## 2023-06-28 ENCOUNTER — Ambulatory Visit
Admission: RE | Admit: 2023-06-28 | Discharge: 2023-06-28 | Disposition: A | Discharge status: Home / Self Care | Payer: Medicare Other | Source: Ambulatory Visit | Attending: Family Medicine | Admitting: Family Medicine

## 2023-06-28 DIAGNOSIS — I2510 Atherosclerotic heart disease of native coronary artery without angina pectoris: Secondary | ICD-10-CM | POA: Diagnosis not present

## 2023-06-28 DIAGNOSIS — I7123 Aneurysm of the descending thoracic aorta, without rupture: Secondary | ICD-10-CM | POA: Diagnosis not present

## 2023-06-28 DIAGNOSIS — I712 Thoracic aortic aneurysm, without rupture, unspecified: Secondary | ICD-10-CM

## 2023-06-28 DIAGNOSIS — R590 Localized enlarged lymph nodes: Secondary | ICD-10-CM | POA: Diagnosis not present

## 2023-06-28 DIAGNOSIS — I700 Atherosclerosis of aorta: Secondary | ICD-10-CM | POA: Diagnosis not present

## 2023-06-28 DIAGNOSIS — J432 Centrilobular emphysema: Secondary | ICD-10-CM | POA: Diagnosis not present

## 2023-06-28 MED ORDER — IOPAMIDOL (ISOVUE-370) INJECTION 76%
75.0000 mL | Freq: Once | INTRAVENOUS | Status: AC | PRN
  Administered 2023-06-28: 60 mL via INTRAVENOUS

## 2023-07-01 ENCOUNTER — Telehealth: Payer: Self-pay

## 2023-07-01 ENCOUNTER — Other Ambulatory Visit: Payer: Self-pay | Admitting: Family Medicine

## 2023-07-01 ENCOUNTER — Ambulatory Visit: Payer: Medicare Other

## 2023-07-01 VITALS — Ht 59.0 in | Wt 150.0 lb

## 2023-07-01 DIAGNOSIS — F1190 Opioid use, unspecified, uncomplicated: Secondary | ICD-10-CM

## 2023-07-01 DIAGNOSIS — G894 Chronic pain syndrome: Secondary | ICD-10-CM

## 2023-07-01 DIAGNOSIS — Z0000 Encounter for general adult medical examination without abnormal findings: Secondary | ICD-10-CM | POA: Diagnosis not present

## 2023-07-01 DIAGNOSIS — G63 Polyneuropathy in diseases classified elsewhere: Secondary | ICD-10-CM

## 2023-07-01 DIAGNOSIS — M47812 Spondylosis without myelopathy or radiculopathy, cervical region: Secondary | ICD-10-CM

## 2023-07-01 DIAGNOSIS — M159 Polyosteoarthritis, unspecified: Secondary | ICD-10-CM

## 2023-07-01 DIAGNOSIS — M170 Bilateral primary osteoarthritis of knee: Secondary | ICD-10-CM

## 2023-07-01 DIAGNOSIS — M48062 Spinal stenosis, lumbar region with neurogenic claudication: Secondary | ICD-10-CM

## 2023-07-01 DIAGNOSIS — M4715 Other spondylosis with myelopathy, thoracolumbar region: Secondary | ICD-10-CM

## 2023-07-01 DIAGNOSIS — G629 Polyneuropathy, unspecified: Secondary | ICD-10-CM

## 2023-07-01 DIAGNOSIS — M160 Bilateral primary osteoarthritis of hip: Secondary | ICD-10-CM

## 2023-07-01 DIAGNOSIS — M19079 Primary osteoarthritis, unspecified ankle and foot: Secondary | ICD-10-CM

## 2023-07-01 DIAGNOSIS — G959 Disease of spinal cord, unspecified: Secondary | ICD-10-CM

## 2023-07-01 DIAGNOSIS — M4804 Spinal stenosis, thoracic region: Secondary | ICD-10-CM

## 2023-07-01 DIAGNOSIS — M19071 Primary osteoarthritis, right ankle and foot: Secondary | ICD-10-CM

## 2023-07-01 DIAGNOSIS — M461 Sacroiliitis, not elsewhere classified: Secondary | ICD-10-CM

## 2023-07-01 DIAGNOSIS — G8929 Other chronic pain: Secondary | ICD-10-CM

## 2023-07-01 MED ORDER — HYDROCODONE-ACETAMINOPHEN 10-325 MG PO TABS
1.0000 | ORAL_TABLET | Freq: Three times a day (TID) | ORAL | 0 refills | Status: DC | PRN
Start: 2023-07-01 — End: 2023-07-29

## 2023-07-01 NOTE — Patient Instructions (Addendum)
Nicole Perez , Thank you for taking time to come for your Medicare Wellness Visit. I appreciate your ongoing commitment to your health goals. Please review the following plan we discussed and let me know if I can assist you in the future.   These are the goals we discussed:  Goals      LDL CALC < 130     Remain active and independent        This is a list of the screening recommended for you and due dates:  Health Maintenance  Topic Date Due   DTaP/Tdap/Td vaccine (2 - Tdap) 07/10/2020   COVID-19 Vaccine (5 - 2023-24 season) 08/17/2022   Flu Shot  07/18/2023   Medicare Annual Wellness Visit  06/30/2024   Pneumonia Vaccine  Completed   Zoster (Shingles) Vaccine  Completed   HPV Vaccine  Aged Out    Advanced directives: Information on Advanced Care Planning can be found at [facility] Advance Health Care Directives Advance Health Care Directives ([email or web address] Please bring a copy of your health care power of attorney and living will to the office to be added to your chart at your convenience.  Conditions/risks identified: Aim for 30 minutes of exercise or brisk walking, 6-8 glasses of water, and 5 servings of fruits and vegetables each day.  Next appointment: Follow up in one year for your annual wellness visit    Preventive Care 65 Years and Older, Female Preventive care refers to lifestyle choices and visits with your health care provider that can promote health and wellness. What does preventive care include? A yearly physical exam. This is also called an annual well check. Dental exams once or twice a year. Routine eye exams. Ask your health care provider how often you should have your eyes checked. Personal lifestyle choices, including: Daily care of your teeth and gums. Regular physical activity. Eating a healthy diet. Avoiding tobacco and drug use. Limiting alcohol use. Practicing safe sex. Taking low-dose aspirin every day. Taking vitamin and mineral  supplements as recommended by your health care provider. What happens during an annual well check? The services and screenings done by your health care provider during your annual well check will depend on your age, overall health, lifestyle risk factors, and family history of disease. Counseling  Your health care provider may ask you questions about your: Alcohol use. Tobacco use. Drug use. Emotional well-being. Home and relationship well-being. Sexual activity. Eating habits. History of falls. Memory and ability to understand (cognition). Work and work Astronomer. Reproductive health. Screening  You may have the following tests or measurements: Height, weight, and BMI. Blood pressure. Lipid and cholesterol levels. These may be checked every 5 years, or more frequently if you are over 22 years old. Skin check. Lung cancer screening. You may have this screening every year starting at age 66 if you have a 30-pack-year history of smoking and currently smoke or have quit within the past 15 years. Fecal occult blood test (FOBT) of the stool. You may have this test every year starting at age 18. Flexible sigmoidoscopy or colonoscopy. You may have a sigmoidoscopy every 5 years or a colonoscopy every 10 years starting at age 70. Hepatitis C blood test. Hepatitis B blood test. Sexually transmitted disease (STD) testing. Diabetes screening. This is done by checking your blood sugar (glucose) after you have not eaten for a while (fasting). You may have this done every 1-3 years. Bone density scan. This is done to screen for osteoporosis.  You may have this done starting at age 22. Mammogram. This may be done every 1-2 years. Talk to your health care provider about how often you should have regular mammograms. Talk with your health care provider about your test results, treatment options, and if necessary, the need for more tests. Vaccines  Your health care provider may recommend certain  vaccines, such as: Influenza vaccine. This is recommended every year. Tetanus, diphtheria, and acellular pertussis (Tdap, Td) vaccine. You may need a Td booster every 10 years. Zoster vaccine. You may need this after age 12. Pneumococcal 13-valent conjugate (PCV13) vaccine. One dose is recommended after age 49. Pneumococcal polysaccharide (PPSV23) vaccine. One dose is recommended after age 65. Talk to your health care provider about which screenings and vaccines you need and how often you need them. This information is not intended to replace advice given to you by your health care provider. Make sure you discuss any questions you have with your health care provider. Document Released: 12/30/2015 Document Revised: 08/22/2016 Document Reviewed: 10/04/2015 Elsevier Interactive Patient Education  2017 [employer].  Fall Prevention in the Home Falls can cause injuries. They can happen to people of all ages. There are many things you can do to make your home safe and to help prevent falls. What can I do on the outside of my home? Regularly fix the edges of walkways and driveways and fix any cracks. Remove anything that might make you trip as you walk through a door, such as a raised step or threshold. Trim any bushes or trees on the path to your home. Use bright outdoor lighting. Clear any walking paths of anything that might make someone trip, such as rocks or tools. Regularly check to see if handrails are loose or broken. Make sure that both sides of any steps have handrails. Any raised decks and porches should have guardrails on the edges. Have any leaves, snow, or ice cleared regularly. Use sand or salt on walking paths during winter. Clean up any spills in your garage right away. This includes oil or grease spills. What can I do in the bathroom? Use night lights. Install grab bars by the toilet and in the tub and shower. Do not use towel bars as grab bars. Use non-skid mats or decals in  the tub or shower. If you need to sit down in the shower, use a plastic, non-slip stool. Keep the floor dry. Clean up any water that spills on the floor as soon as it happens. Remove soap buildup in the tub or shower regularly. Attach bath mats securely with double-sided non-slip rug tape. Do not have throw rugs and other things on the floor that can make you trip. What can I do in the bedroom? Use night lights. Make sure that you have a light by your bed that is easy to reach. Do not use any sheets or blankets that are too big for your bed. They should not hang down onto the floor. Have a firm chair that has side arms. You can use this for support while you get dressed. Do not have throw rugs and other things on the floor that can make you trip. What can I do in the kitchen? Clean up any spills right away. Avoid walking on wet floors. Keep items that you use a lot in easy-to-reach places. If you need to reach something above you, use a strong step stool that has a grab bar. Keep electrical cords out of the way. Do not use  floor polish or wax that makes floors slippery. If you must use wax, use non-skid floor wax. Do not have throw rugs and other things on the floor that can make you trip. What can I do with my stairs? Do not leave any items on the stairs. Make sure that there are handrails on both sides of the stairs and use them. Fix handrails that are broken or loose. Make sure that handrails are as long as the stairways. Check any carpeting to make sure that it is firmly attached to the stairs. Fix any carpet that is loose or worn. Avoid having throw rugs at the top or bottom of the stairs. If you do have throw rugs, attach them to the floor with carpet tape. Make sure that you have a light switch at the top of the stairs and the bottom of the stairs. If you do not have them, ask someone to add them for you. What else can I do to help prevent falls? Wear shoes that: Do not have high  heels. Have rubber bottoms. Are comfortable and fit you well. Are closed at the toe. Do not wear sandals. If you use a stepladder: Make sure that it is fully opened. Do not climb a closed stepladder. Make sure that both sides of the stepladder are locked into place. Ask someone to hold it for you, if possible. Clearly mark and make sure that you can see: Any grab bars or handrails. First and last steps. Where the edge of each step is. Use tools that help you move around (mobility aids) if they are needed. These include: Canes. Walkers. Scooters. Crutches. Turn on the lights when you go into a dark area. Replace any light bulbs as soon as they burn out. Set up your furniture so you have a clear path. Avoid moving your furniture around. If any of your floors are uneven, fix them. If there are any pets around you, be aware of where they are. Review your medicines with your doctor. Some medicines can make you feel dizzy. This can increase your chance of falling. Ask your doctor what other things that you can do to help prevent falls. This information is not intended to replace advice given to you by your health care provider. Make sure you discuss any questions you have with your health care provider. Document Released: 09/29/2009 Document Revised: 05/10/2016 Document Reviewed: 01/07/2015 Elsevier Interactive Patient Education  2017 [employer].

## 2023-07-01 NOTE — Telephone Encounter (Signed)
Patients daughter calls nurse line requesting a refill on pain medication.   I do not see this on her medication list anymore.   Will forward to PCP.

## 2023-07-01 NOTE — Progress Notes (Signed)
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 Score 0 0 0 0  0 1  PHQ- 9 Score 0 0 0 0  2 2  Exception Documentation     Patient refusal      Fall Risk    07/01/2023    5:13 PM 06/12/2023    2:59 PM 05/30/2023    9:31 AM 11/29/2022   10:53 AM 11/29/2022   10:51 AM  Fall Risk   Falls in the past year? 0 0 0 0 0  Number falls in past yr: 0 0 0 0 0  Injury with Fall? 0 0 0 0 0  Risk for fall due to : Impaired mobility;Impaired balance/gait Impaired mobility;Impaired balance/gait;Medication side effect     Follow up Falls prevention discussed;Education provided;Falls evaluation completed Falls prevention discussed       MEDICARE RISK AT HOME:  Medicare Risk at Home - 07/01/23 1713     Any stairs in or around the home? No    If so, are there any without handrails?  No    Home free of loose throw rugs in walkways, pet beds, electrical cords, etc? Yes    Adequate lighting in your home to reduce risk of falls? Yes    Life alert? No    Use of a cane, walker or w/c? Yes    Grab bars in the bathroom? Yes    Shower chair or bench in shower? No    Elevated toilet seat or a handicapped toilet? Yes             TIMED UP AND GO:  Was the test performed?  No    Cognitive Function:        07/01/2023    5:14 PM  6CIT Screen  What Year? 0 points  What month? 0 points  What time? 0 points  Count back from 20 0 points  Months in reverse 2 points  Repeat phrase 4 points  Total Score 6 points    Immunizations Immunization History  Administered Date(s) Administered   Fluad Quad(high Dose 65+) 10/29/2019, 09/29/2020, 10/26/2021, 09/13/2022   Influenza Split 09/30/2012   Influenza,inj,Quad PF,6+ Mos 08/10/2013, 08/31/2014, 11/17/2015, 09/13/2016, 08/22/2017, 02/12/2019   PFIZER(Purple Top)SARS-COV-2 Vaccination 02/06/2020, 03/01/2020, 09/29/2020   Pfizer Covid-19 Vaccine Bivalent Booster 21yrs & up 10/26/2021   Pneumococcal Conjugate-13 03/11/2014   Pneumococcal Polysaccharide-23 12/18/2003   Td 07/10/2010   Zoster Recombinant(Shingrix) 02/06/2022, 04/03/2022    TDAP status: Due, Education has been provided regarding the importance of  this vaccine. Advised may receive this vaccine at local pharmacy or Health Dept. Aware to provide a copy of the vaccination record if obtained from local pharmacy or Health Dept. Verbalized acceptance and understanding.  Pneumococcal vaccine status: Up to date  Covid-19 vaccine status: Information provided on how to obtain vaccines.   Qualifies for Shingles Vaccine? Yes   Zostavax completed No   Shingrix Completed?: Yes  Screening Tests Health Maintenance  Topic Date Due   DTaP/Tdap/Td (2 - Tdap) 07/10/2020   COVID-19 Vaccine (5 - 2023-24 season) 08/17/2022   INFLUENZA VACCINE  07/18/2023   Medicare Annual  Wellness (AWV)  06/30/2024   Pneumonia Vaccine 32+ Years old  Completed   Zoster Vaccines- Shingrix  Completed   HPV VACCINES  Aged Out    Health Maintenance  Health Maintenance Due  Topic Date Due   DTaP/Tdap/Td (2 - Tdap) 07/10/2020   COVID-19 Vaccine (5 - 2023-24 season) 08/17/2022    Colorectal cancer screening: No longer required.   Mammogram status: No longer required due to age and preference.  Lung Cancer Screening: (Low Dose CT Chest recommended if Age 48-80 years, 20 pack-year currently smoking OR have quit w/in 15years.) does not qualify.   Lung Cancer Screening Referral: n/a  Additional Screening:  Hepatitis C Screening: does not qualify  Vision Screening: Recommended annual ophthalmology exams for early detection of glaucoma and other disorders of the eye. Is the patient up to date with their annual eye exam?  No  Who is the provider or what is the name of the office in which the patient attends annual eye exams? none If pt is not established with a provider, would they like to be referred to a provider to establish care? No .   Dental Screening: Recommended annual dental exams for proper oral hygiene  Community Resource Referral / Chronic Care Management: CRR required this visit?  No   CCM required this visit?  No     Plan:     I have personally reviewed and noted the following in the patient's chart:   Medical and social history Use of alcohol, tobacco or illicit drugs  Current medications and supplements including opioid prescriptions. Patient is currently taking opioid prescriptions. Information provided to patient regarding non-opioid alternatives. Patient advised to discuss non-opioid treatment plan with their provider. Functional ability and status Nutritional status Physical activity Advanced directives List of other physicians Hospitalizations, surgeries, and ER visits in previous 12 months Vitals Screenings to include cognitive,  depression, and falls Referrals and appointments  In addition, I have reviewed and discussed with patient certain preventive protocols, quality metrics, and best practice recommendations. A written personalized care plan for preventive services as well as general preventive health recommendations were provided to patient.     Kandis Fantasia [place], [place]   4/78/2956   After Visit Summary: (Mail) Due to this being a telephonic visit, the after visit summary with patients personalized plan was offered to patient via mail   Nurse Notes: No concerns

## 2023-07-01 NOTE — Progress Notes (Unsigned)
Refill HC-APAP 10-325 #130 Refill Lyrica 25 mg caps #30 RF 5

## 2023-07-29 ENCOUNTER — Other Ambulatory Visit: Payer: Self-pay

## 2023-07-29 DIAGNOSIS — G894 Chronic pain syndrome: Secondary | ICD-10-CM

## 2023-07-29 DIAGNOSIS — M170 Bilateral primary osteoarthritis of knee: Secondary | ICD-10-CM

## 2023-07-29 DIAGNOSIS — M159 Polyosteoarthritis, unspecified: Secondary | ICD-10-CM

## 2023-07-29 DIAGNOSIS — M4804 Spinal stenosis, thoracic region: Secondary | ICD-10-CM

## 2023-07-29 DIAGNOSIS — M4715 Other spondylosis with myelopathy, thoracolumbar region: Secondary | ICD-10-CM

## 2023-07-29 DIAGNOSIS — M19079 Primary osteoarthritis, unspecified ankle and foot: Secondary | ICD-10-CM

## 2023-07-29 DIAGNOSIS — M461 Sacroiliitis, not elsewhere classified: Secondary | ICD-10-CM

## 2023-07-29 DIAGNOSIS — F1190 Opioid use, unspecified, uncomplicated: Secondary | ICD-10-CM

## 2023-07-29 DIAGNOSIS — M160 Bilateral primary osteoarthritis of hip: Secondary | ICD-10-CM

## 2023-07-29 DIAGNOSIS — M47812 Spondylosis without myelopathy or radiculopathy, cervical region: Secondary | ICD-10-CM

## 2023-07-29 DIAGNOSIS — G629 Polyneuropathy, unspecified: Secondary | ICD-10-CM

## 2023-07-29 DIAGNOSIS — G959 Disease of spinal cord, unspecified: Secondary | ICD-10-CM

## 2023-07-29 DIAGNOSIS — G8929 Other chronic pain: Secondary | ICD-10-CM

## 2023-07-29 DIAGNOSIS — M19071 Primary osteoarthritis, right ankle and foot: Secondary | ICD-10-CM

## 2023-07-29 DIAGNOSIS — G63 Polyneuropathy in diseases classified elsewhere: Secondary | ICD-10-CM

## 2023-07-29 DIAGNOSIS — M48062 Spinal stenosis, lumbar region with neurogenic claudication: Secondary | ICD-10-CM

## 2023-07-30 MED ORDER — HYDROCODONE-ACETAMINOPHEN 10-325 MG PO TABS
1.0000 | ORAL_TABLET | Freq: Three times a day (TID) | ORAL | 0 refills | Status: DC | PRN
Start: 2023-07-30 — End: 2023-08-22

## 2023-08-05 ENCOUNTER — Other Ambulatory Visit: Payer: Self-pay | Admitting: Family Medicine

## 2023-08-05 DIAGNOSIS — N3020 Other chronic cystitis without hematuria: Secondary | ICD-10-CM

## 2023-08-05 DIAGNOSIS — N390 Urinary tract infection, site not specified: Secondary | ICD-10-CM

## 2023-08-14 DIAGNOSIS — M79671 Pain in right foot: Secondary | ICD-10-CM | POA: Diagnosis not present

## 2023-08-14 DIAGNOSIS — M21371 Foot drop, right foot: Secondary | ICD-10-CM | POA: Diagnosis not present

## 2023-08-22 ENCOUNTER — Other Ambulatory Visit: Payer: Self-pay

## 2023-08-22 DIAGNOSIS — M170 Bilateral primary osteoarthritis of knee: Secondary | ICD-10-CM

## 2023-08-22 DIAGNOSIS — G959 Disease of spinal cord, unspecified: Secondary | ICD-10-CM

## 2023-08-22 DIAGNOSIS — G629 Polyneuropathy, unspecified: Secondary | ICD-10-CM

## 2023-08-22 DIAGNOSIS — G894 Chronic pain syndrome: Secondary | ICD-10-CM

## 2023-08-22 DIAGNOSIS — M461 Sacroiliitis, not elsewhere classified: Secondary | ICD-10-CM

## 2023-08-22 DIAGNOSIS — M4804 Spinal stenosis, thoracic region: Secondary | ICD-10-CM

## 2023-08-22 DIAGNOSIS — M19071 Primary osteoarthritis, right ankle and foot: Secondary | ICD-10-CM

## 2023-08-22 DIAGNOSIS — M4715 Other spondylosis with myelopathy, thoracolumbar region: Secondary | ICD-10-CM

## 2023-08-22 DIAGNOSIS — M48062 Spinal stenosis, lumbar region with neurogenic claudication: Secondary | ICD-10-CM

## 2023-08-22 DIAGNOSIS — M47812 Spondylosis without myelopathy or radiculopathy, cervical region: Secondary | ICD-10-CM

## 2023-08-22 DIAGNOSIS — F1190 Opioid use, unspecified, uncomplicated: Secondary | ICD-10-CM

## 2023-08-22 DIAGNOSIS — M159 Polyosteoarthritis, unspecified: Secondary | ICD-10-CM

## 2023-08-22 DIAGNOSIS — M160 Bilateral primary osteoarthritis of hip: Secondary | ICD-10-CM

## 2023-08-22 DIAGNOSIS — G63 Polyneuropathy in diseases classified elsewhere: Secondary | ICD-10-CM

## 2023-08-22 DIAGNOSIS — M19079 Primary osteoarthritis, unspecified ankle and foot: Secondary | ICD-10-CM

## 2023-08-22 DIAGNOSIS — G8929 Other chronic pain: Secondary | ICD-10-CM

## 2023-08-23 MED ORDER — HYDROCODONE-ACETAMINOPHEN 10-325 MG PO TABS
1.0000 | ORAL_TABLET | Freq: Three times a day (TID) | ORAL | 0 refills | Status: DC | PRN
Start: 2023-08-23 — End: 2023-09-20

## 2023-09-03 ENCOUNTER — Other Ambulatory Visit: Payer: Self-pay | Admitting: Family Medicine

## 2023-09-03 DIAGNOSIS — M4804 Spinal stenosis, thoracic region: Secondary | ICD-10-CM

## 2023-09-03 DIAGNOSIS — G63 Polyneuropathy in diseases classified elsewhere: Secondary | ICD-10-CM

## 2023-09-03 DIAGNOSIS — K5903 Drug induced constipation: Secondary | ICD-10-CM

## 2023-09-03 DIAGNOSIS — G629 Polyneuropathy, unspecified: Secondary | ICD-10-CM

## 2023-09-03 DIAGNOSIS — M48062 Spinal stenosis, lumbar region with neurogenic claudication: Secondary | ICD-10-CM

## 2023-09-03 DIAGNOSIS — G959 Disease of spinal cord, unspecified: Secondary | ICD-10-CM

## 2023-09-05 ENCOUNTER — Telehealth: Payer: Self-pay | Admitting: Family Medicine

## 2023-09-05 NOTE — Telephone Encounter (Signed)
Patient called [facility] office phone asking what services we provide. I let her know we provide services to patients in [facility] only and gave the patient the number to [facility]

## 2023-09-20 ENCOUNTER — Other Ambulatory Visit: Payer: Self-pay

## 2023-09-20 DIAGNOSIS — M160 Bilateral primary osteoarthritis of hip: Secondary | ICD-10-CM

## 2023-09-20 DIAGNOSIS — F1190 Opioid use, unspecified, uncomplicated: Secondary | ICD-10-CM

## 2023-09-20 DIAGNOSIS — M19079 Primary osteoarthritis, unspecified ankle and foot: Secondary | ICD-10-CM

## 2023-09-20 DIAGNOSIS — M150 Primary generalized (osteo)arthritis: Secondary | ICD-10-CM

## 2023-09-20 DIAGNOSIS — M4804 Spinal stenosis, thoracic region: Secondary | ICD-10-CM

## 2023-09-20 DIAGNOSIS — M19071 Primary osteoarthritis, right ankle and foot: Secondary | ICD-10-CM

## 2023-09-20 DIAGNOSIS — M48062 Spinal stenosis, lumbar region with neurogenic claudication: Secondary | ICD-10-CM

## 2023-09-20 DIAGNOSIS — M47812 Spondylosis without myelopathy or radiculopathy, cervical region: Secondary | ICD-10-CM

## 2023-09-20 DIAGNOSIS — G63 Polyneuropathy in diseases classified elsewhere: Secondary | ICD-10-CM

## 2023-09-20 DIAGNOSIS — G8929 Other chronic pain: Secondary | ICD-10-CM

## 2023-09-20 DIAGNOSIS — G959 Disease of spinal cord, unspecified: Secondary | ICD-10-CM

## 2023-09-20 DIAGNOSIS — M4715 Other spondylosis with myelopathy, thoracolumbar region: Secondary | ICD-10-CM

## 2023-09-20 DIAGNOSIS — G629 Polyneuropathy, unspecified: Secondary | ICD-10-CM

## 2023-09-20 DIAGNOSIS — M170 Bilateral primary osteoarthritis of knee: Secondary | ICD-10-CM

## 2023-09-20 DIAGNOSIS — G894 Chronic pain syndrome: Secondary | ICD-10-CM

## 2023-09-20 DIAGNOSIS — M461 Sacroiliitis, not elsewhere classified: Secondary | ICD-10-CM

## 2023-09-20 MED ORDER — HYDROCODONE-ACETAMINOPHEN 10-325 MG PO TABS
1.0000 | ORAL_TABLET | Freq: Three times a day (TID) | ORAL | 0 refills | Status: DC | PRN
Start: 2023-09-20 — End: 2023-10-24

## 2023-10-01 DIAGNOSIS — L821 Other seborrheic keratosis: Secondary | ICD-10-CM | POA: Diagnosis not present

## 2023-10-01 DIAGNOSIS — D485 Neoplasm of uncertain behavior of skin: Secondary | ICD-10-CM | POA: Diagnosis not present

## 2023-10-01 DIAGNOSIS — L820 Inflamed seborrheic keratosis: Secondary | ICD-10-CM | POA: Diagnosis not present

## 2023-10-04 ENCOUNTER — Other Ambulatory Visit: Payer: Self-pay | Admitting: Family Medicine

## 2023-10-04 DIAGNOSIS — I10 Essential (primary) hypertension: Secondary | ICD-10-CM

## 2023-10-24 ENCOUNTER — Other Ambulatory Visit: Payer: Self-pay

## 2023-10-24 DIAGNOSIS — G959 Disease of spinal cord, unspecified: Secondary | ICD-10-CM

## 2023-10-24 DIAGNOSIS — M47812 Spondylosis without myelopathy or radiculopathy, cervical region: Secondary | ICD-10-CM

## 2023-10-24 DIAGNOSIS — M160 Bilateral primary osteoarthritis of hip: Secondary | ICD-10-CM

## 2023-10-24 DIAGNOSIS — M461 Sacroiliitis, not elsewhere classified: Secondary | ICD-10-CM

## 2023-10-24 DIAGNOSIS — M19079 Primary osteoarthritis, unspecified ankle and foot: Secondary | ICD-10-CM

## 2023-10-24 DIAGNOSIS — F1190 Opioid use, unspecified, uncomplicated: Secondary | ICD-10-CM

## 2023-10-24 DIAGNOSIS — M4804 Spinal stenosis, thoracic region: Secondary | ICD-10-CM

## 2023-10-24 DIAGNOSIS — M170 Bilateral primary osteoarthritis of knee: Secondary | ICD-10-CM

## 2023-10-24 DIAGNOSIS — G629 Polyneuropathy, unspecified: Secondary | ICD-10-CM

## 2023-10-24 DIAGNOSIS — G63 Polyneuropathy in diseases classified elsewhere: Secondary | ICD-10-CM

## 2023-10-24 DIAGNOSIS — M48062 Spinal stenosis, lumbar region with neurogenic claudication: Secondary | ICD-10-CM

## 2023-10-24 DIAGNOSIS — G894 Chronic pain syndrome: Secondary | ICD-10-CM

## 2023-10-24 DIAGNOSIS — M19071 Primary osteoarthritis, right ankle and foot: Secondary | ICD-10-CM

## 2023-10-24 DIAGNOSIS — M4715 Other spondylosis with myelopathy, thoracolumbar region: Secondary | ICD-10-CM

## 2023-10-24 DIAGNOSIS — M150 Primary generalized (osteo)arthritis: Secondary | ICD-10-CM

## 2023-10-24 DIAGNOSIS — G8929 Other chronic pain: Secondary | ICD-10-CM

## 2023-10-25 MED ORDER — HYDROCODONE-ACETAMINOPHEN 10-325 MG PO TABS: 1.0000 | ORAL_TABLET | Freq: Three times a day (TID) | ORAL | 0 refills | Status: DC | PRN

## 2023-10-25 NOTE — Telephone Encounter (Signed)
Received multiple calls from patient's family regarding prescription refill.   They are concerned as they do not want her to go all weekend without pain medicine.   Spoke with Dr. Jennette Kettle who advised that I could send request to her as Dr. McDiarmid is out of the office today.   Veronda Prude, RN

## 2023-10-25 NOTE — Telephone Encounter (Signed)
Called daughter and informed.   Talbot Grumbling, RN

## 2023-11-21 ENCOUNTER — Other Ambulatory Visit: Payer: Self-pay

## 2023-11-21 DIAGNOSIS — M19079 Primary osteoarthritis, unspecified ankle and foot: Secondary | ICD-10-CM

## 2023-11-21 DIAGNOSIS — M4804 Spinal stenosis, thoracic region: Secondary | ICD-10-CM

## 2023-11-21 DIAGNOSIS — G63 Polyneuropathy in diseases classified elsewhere: Secondary | ICD-10-CM

## 2023-11-21 DIAGNOSIS — G894 Chronic pain syndrome: Secondary | ICD-10-CM

## 2023-11-21 DIAGNOSIS — M19071 Primary osteoarthritis, right ankle and foot: Secondary | ICD-10-CM

## 2023-11-21 DIAGNOSIS — M48062 Spinal stenosis, lumbar region with neurogenic claudication: Secondary | ICD-10-CM

## 2023-11-21 DIAGNOSIS — G959 Disease of spinal cord, unspecified: Secondary | ICD-10-CM

## 2023-11-21 DIAGNOSIS — M47812 Spondylosis without myelopathy or radiculopathy, cervical region: Secondary | ICD-10-CM

## 2023-11-21 DIAGNOSIS — M160 Bilateral primary osteoarthritis of hip: Secondary | ICD-10-CM

## 2023-11-21 DIAGNOSIS — F1190 Opioid use, unspecified, uncomplicated: Secondary | ICD-10-CM

## 2023-11-21 DIAGNOSIS — M461 Sacroiliitis, not elsewhere classified: Secondary | ICD-10-CM

## 2023-11-21 DIAGNOSIS — M170 Bilateral primary osteoarthritis of knee: Secondary | ICD-10-CM

## 2023-11-21 DIAGNOSIS — G629 Polyneuropathy, unspecified: Secondary | ICD-10-CM

## 2023-11-21 DIAGNOSIS — M150 Primary generalized (osteo)arthritis: Secondary | ICD-10-CM

## 2023-11-21 DIAGNOSIS — M4715 Other spondylosis with myelopathy, thoracolumbar region: Secondary | ICD-10-CM

## 2023-11-21 DIAGNOSIS — G8929 Other chronic pain: Secondary | ICD-10-CM

## 2023-11-22 MED ORDER — HYDROCODONE-ACETAMINOPHEN 10-325 MG PO TABS: 1.0000 | ORAL_TABLET | Freq: Three times a day (TID) | ORAL | 0 refills | Status: DC | PRN

## 2023-11-28 ENCOUNTER — Ambulatory Visit: Payer: Medicare Other | Admitting: Family Medicine

## 2023-11-28 ENCOUNTER — Encounter: Payer: Self-pay | Admitting: Family Medicine

## 2023-11-28 VITALS — BP 138/92 | HR 92 | Ht 59.0 in | Wt 154.0 lb

## 2023-11-28 DIAGNOSIS — M25361 Other instability, right knee: Secondary | ICD-10-CM | POA: Diagnosis not present

## 2023-11-28 DIAGNOSIS — D638 Anemia in other chronic diseases classified elsewhere: Secondary | ICD-10-CM | POA: Diagnosis not present

## 2023-11-28 DIAGNOSIS — Z23 Encounter for immunization: Secondary | ICD-10-CM

## 2023-11-28 MED ORDER — KNEE SLEEVE/MEDIUM MISC
1.0000 | Freq: Every day | 0 refills | Status: DC
Start: 2023-11-28 — End: 2024-01-30

## 2023-11-28 NOTE — Progress Notes (Signed)
I have seen and examined this patient, and reviewed their chart. I have discussed this patient with MS Hunt. I agree with the resident's findings, assessment and care plan.

## 2023-11-28 NOTE — Progress Notes (Signed)
Nicole Perez is accompanied by patient and daughter Sources of clinical information for visit is/are patient and relative(s). Nursing assessment for this office visit was reviewed with the patient for accuracy and revision.   Nicole Perez. Lone is a 89 year old who is presenting with intermittent dizziness episodes for the past three months.   She takes both her losartan-hydrochlorothiazide and her metoprolol XL at the same time in the morning. Recently, she has discontinued her losartan-hydrochlorothiazide for 3 days because she was concerned that her BP medications were causing her dizziness. She felt some relief in her dizziness while not taking her medications, but restarted them in order to keep her BP under control. She has not been measuring her BP at home.   Dizziness This is a chronic (3 months) problem. The current episode started more than 1 month ago. The problem occurs daily (when she takes her BP medication). The problem has been unchanged. Associated symptoms include a visual change and weakness. Pertinent negatives include no vertigo or vomiting. Exacerbated by: taking BP meds. She has tried nothing for the symptoms.  Insomnia Primary symptoms: fragmented sleep, difficulty falling asleep, premature morning awakening.   The current episode started more than one month. The onset quality is gradual. The problem occurs nightly. The problem is unchanged. How many beverages per day that contain caffeine: 0 - 1.  Types of beverages you drink: coffee. Nothing relieves the symptoms. Typical bedtime:  8-10 P.M..  How long after going to bed to you fall asleep: 30-60 minutes.      Foot Drop Patient is still experiencing "knee clicking." Was previously unable to get a knee brace due to financial difficulties.   Previous Report(s) Reviewed: none     07/01/2023    5:08 PM  Depression screen PHQ 2/9  Decreased Interest 0  Down, Depressed, Hopeless 0  PHQ - 2 Score 0  Altered sleeping 0  Tired,  decreased energy 0  Change in appetite 0  Feeling bad or failure about yourself  0  Trouble concentrating 0  Moving slowly or fidgety/restless 0  Suicidal thoughts 0  PHQ-9 Score 0   Flowsheet Row Clinical Support from 07/01/2023 in [facility]. [facility] Office Visit from 06/12/2023 in [facility]. [facility] Office Visit from 11/29/2022 in [facility]. [facility]  Thoughts that you would be better off dead, or of hurting yourself in some way Not at all Not at all Not at all  PHQ-9 Total Score 0 0 0          07/01/2023    5:13 PM 06/12/2023    2:59 PM 05/30/2023    9:31 AM 11/29/2022   10:53 AM 11/29/2022   10:51 AM  Fall Risk   Falls in the past year? 0 0 0 0 0  Number falls in past yr: 0 0 0 0 0  Injury with Fall? 0 0 0 0 0  Risk for fall due to : Impaired mobility;Impaired balance/gait Impaired mobility;Impaired balance/gait;Medication side effect     Follow up Falls prevention discussed;Education provided;Falls evaluation completed Falls prevention discussed          07/01/2023    5:08 PM 06/12/2023    2:58 PM 11/29/2022   10:52 AM  PHQ9 SCORE ONLY  PHQ-9 Total Score 0 0 0  There are no preventive care reminders to display for this patient.  Health Maintenance Due  Topic Date Due   DTaP/Tdap/Td (2 - Tdap) 07/10/2020   INFLUENZA VACCINE  07/18/2023   COVID-19 Vaccine (5 - 2024-25 season) 08/18/2023      History/P.E. limitations: none  There are no preventive care reminders to display for this patient. There are no preventive care reminders to display for this patient.  Health Maintenance Due  Topic Date Due   DTaP/Tdap/Td (2 - Tdap) 07/10/2020   INFLUENZA VACCINE  07/18/2023   COVID-19 Vaccine (5 - 2024-25 season) 08/18/2023     No chief complaint on file.   Physical Exam Constitutional:      Appearance: Normal  appearance.  Cardiovascular:     Rate and Rhythm: Normal rate and regular rhythm.     Pulses: Normal pulses.     Heart sounds: Normal heart sounds.  Pulmonary:     Effort: Pulmonary effort is normal.     Breath sounds: Normal breath sounds.  Abdominal:     General: There is no distension.     Palpations: Abdomen is soft.  Musculoskeletal:     Comments: Anterior translation of the patella on extension of right leg. Right-sided chronic foot drop.  Neurological:     Mental Status: She is alert.     --------------------------------------------------------------------------------------------------------------------------------------------- Visit Problem List with A/P  No problem-specific Assessment & Plan notes found for this encounter. Nicole Perez. Nicole Perez is a 89 year old who is presenting with intermittent dizziness episodes for the past three months.   Differential diagnosis for chronic dizziness includes medication side effects, orthostatic HTN, anemia, and Afib. Unable to obtain orthostatic vitals in office today due to patient's difficulty with standing for a prolonged period of time. Anemia is included on the differential as a cause of dizziness, and will order a CBC to check this. Afib or cardiac arrhythmia is a cause of dizziness, but less likely due to normal vital signs and regular heartbeat on physical exam. Most likely etiology is medication adverse effect given the resolution of her symptoms after discontinuing her BP medication. It is possible that her BP was becoming hypotensive, but unknown for sure because she does not check her BP at home. Will advise that patient takes losartan-hydrochlorothiazide in AM and Metoprolol XL at night. Will recommend checking BP at home. Will follow up on this issue in 4 weeks.  - Recommend taking losartan-hydrochlorothiazide in the AM  - Metoprolol XL at night  - f/u CBC results  Insomnia Patient endorses daily sleep disturbances including  difficulty falling asleep and frequent nighttime awakening. Ddx for insomnia includes normal aging, and depression. My leading etiology is normal aging process. Depression is less likely, given her PHQ9 screening score of 0. Will recommend good sleep hygiene (turning off lights, limiting electronic use 30 mins before bed, limiting caffeine use). She may benefit from a low dose of OTC melatonin (5 mg), but want to avoid sedating her too much due to age. Will follow up on this issue in 4 weeks.   Foot Drop:  This is an ongoing problem that is stable. Ordered a knee sleeve at [facility] to alleviate the cost barrier and provide patient with some physical relief.   Health Maintenance: Received Flu vaccine today.

## 2023-11-28 NOTE — Patient Instructions (Addendum)
Take the Losartan-hydrochlorothiazide 1 tablet in the morning Take the metoprolol XL half-tablet at bedtime.    [facility] to get the knee sleeve.  We are checking you for anemia today.

## 2023-11-29 ENCOUNTER — Encounter: Payer: Self-pay | Admitting: Family Medicine

## 2023-11-29 LAB — CBC
Hematocrit: 32.7 % — ABNORMAL LOW (ref 34.0–46.6)
Hemoglobin: 10.6 g/dL — ABNORMAL LOW (ref 11.1–15.9)
MCH: 29.4 pg (ref 26.6–33.0)
MCHC: 32.4 g/dL (ref 31.5–35.7)
MCV: 91 fL (ref 79–97)
Platelets: 215 10*3/uL (ref 150–450)
RBC: 3.61 x10E6/uL — ABNORMAL LOW (ref 3.77–5.28)
RDW: 13.3 % (ref 11.7–15.4)
WBC: 5 10*3/uL (ref 3.4–10.8)

## 2023-12-16 ENCOUNTER — Other Ambulatory Visit: Payer: Self-pay | Admitting: Family Medicine

## 2023-12-23 ENCOUNTER — Other Ambulatory Visit: Payer: Self-pay

## 2023-12-23 DIAGNOSIS — M150 Primary generalized (osteo)arthritis: Secondary | ICD-10-CM

## 2023-12-23 DIAGNOSIS — M160 Bilateral primary osteoarthritis of hip: Secondary | ICD-10-CM

## 2023-12-23 DIAGNOSIS — M47812 Spondylosis without myelopathy or radiculopathy, cervical region: Secondary | ICD-10-CM

## 2023-12-23 DIAGNOSIS — M19071 Primary osteoarthritis, right ankle and foot: Secondary | ICD-10-CM

## 2023-12-23 DIAGNOSIS — M4804 Spinal stenosis, thoracic region: Secondary | ICD-10-CM

## 2023-12-23 DIAGNOSIS — M170 Bilateral primary osteoarthritis of knee: Secondary | ICD-10-CM

## 2023-12-23 DIAGNOSIS — G894 Chronic pain syndrome: Secondary | ICD-10-CM

## 2023-12-23 DIAGNOSIS — M461 Sacroiliitis, not elsewhere classified: Secondary | ICD-10-CM

## 2023-12-23 DIAGNOSIS — F1190 Opioid use, unspecified, uncomplicated: Secondary | ICD-10-CM

## 2023-12-23 DIAGNOSIS — G629 Polyneuropathy, unspecified: Secondary | ICD-10-CM

## 2023-12-23 DIAGNOSIS — M19079 Primary osteoarthritis, unspecified ankle and foot: Secondary | ICD-10-CM

## 2023-12-23 DIAGNOSIS — G959 Disease of spinal cord, unspecified: Secondary | ICD-10-CM

## 2023-12-23 DIAGNOSIS — G8929 Other chronic pain: Secondary | ICD-10-CM

## 2023-12-23 DIAGNOSIS — M48062 Spinal stenosis, lumbar region with neurogenic claudication: Secondary | ICD-10-CM

## 2023-12-23 DIAGNOSIS — M4715 Other spondylosis with myelopathy, thoracolumbar region: Secondary | ICD-10-CM

## 2023-12-23 DIAGNOSIS — G63 Polyneuropathy in diseases classified elsewhere: Secondary | ICD-10-CM

## 2023-12-23 MED ORDER — HYDROCODONE-ACETAMINOPHEN 10-325 MG PO TABS: 1.0000 | ORAL_TABLET | Freq: Three times a day (TID) | ORAL | 0 refills | Status: DC | PRN

## 2024-01-02 ENCOUNTER — Other Ambulatory Visit: Payer: Self-pay

## 2024-01-02 DIAGNOSIS — N390 Urinary tract infection, site not specified: Secondary | ICD-10-CM

## 2024-01-02 DIAGNOSIS — N3020 Other chronic cystitis without hematuria: Secondary | ICD-10-CM

## 2024-01-02 NOTE — Telephone Encounter (Signed)
Patients daughter calls nurse line requesting a refill on Doxycycline.   She reports she called the pharmacy and there are no more refills on file. She reports Layelle threw the last bottle away and therefore she can not reference how many refills were left.   She reports she has been complaining of dysuria with catheter.   Denies any fevers or chills.   Advised will send to PCP.

## 2024-01-06 MED ORDER — DOXYCYCLINE HYCLATE 100 MG PO TABS: 100.0000 mg | ORAL_TABLET | Freq: Two times a day (BID) | ORAL | 3 refills | Status: DC

## 2024-01-17 ENCOUNTER — Other Ambulatory Visit: Payer: Self-pay

## 2024-01-17 DIAGNOSIS — M160 Bilateral primary osteoarthritis of hip: Secondary | ICD-10-CM

## 2024-01-17 DIAGNOSIS — M47812 Spondylosis without myelopathy or radiculopathy, cervical region: Secondary | ICD-10-CM

## 2024-01-17 DIAGNOSIS — M19079 Primary osteoarthritis, unspecified ankle and foot: Secondary | ICD-10-CM

## 2024-01-17 DIAGNOSIS — M170 Bilateral primary osteoarthritis of knee: Secondary | ICD-10-CM

## 2024-01-17 DIAGNOSIS — G959 Disease of spinal cord, unspecified: Secondary | ICD-10-CM

## 2024-01-17 DIAGNOSIS — M4804 Spinal stenosis, thoracic region: Secondary | ICD-10-CM

## 2024-01-17 DIAGNOSIS — G629 Polyneuropathy, unspecified: Secondary | ICD-10-CM

## 2024-01-17 DIAGNOSIS — M19071 Primary osteoarthritis, right ankle and foot: Secondary | ICD-10-CM

## 2024-01-17 DIAGNOSIS — F1190 Opioid use, unspecified, uncomplicated: Secondary | ICD-10-CM

## 2024-01-17 DIAGNOSIS — M4715 Other spondylosis with myelopathy, thoracolumbar region: Secondary | ICD-10-CM

## 2024-01-17 DIAGNOSIS — G894 Chronic pain syndrome: Secondary | ICD-10-CM

## 2024-01-17 DIAGNOSIS — G8929 Other chronic pain: Secondary | ICD-10-CM

## 2024-01-17 DIAGNOSIS — M48062 Spinal stenosis, lumbar region with neurogenic claudication: Secondary | ICD-10-CM

## 2024-01-17 DIAGNOSIS — G63 Polyneuropathy in diseases classified elsewhere: Secondary | ICD-10-CM

## 2024-01-17 DIAGNOSIS — M461 Sacroiliitis, not elsewhere classified: Secondary | ICD-10-CM

## 2024-01-17 DIAGNOSIS — M150 Primary generalized (osteo)arthritis: Secondary | ICD-10-CM

## 2024-01-17 MED ORDER — HYDROCODONE-ACETAMINOPHEN 10-325 MG PO TABS: 1.0000 | ORAL_TABLET | Freq: Three times a day (TID) | ORAL | 0 refills | Status: DC | PRN

## 2024-01-30 ENCOUNTER — Emergency Department (HOSPITAL_COMMUNITY): Payer: Medicare Other

## 2024-01-30 ENCOUNTER — Other Ambulatory Visit: Payer: Self-pay

## 2024-01-30 ENCOUNTER — Inpatient Hospital Stay (HOSPITAL_COMMUNITY)
Admission: EM | Admit: 2024-01-30 | Discharge: 2024-02-01 | DRG: 065 | Disposition: A | Discharge status: Home / Self Care | Payer: Medicare Other | Attending: Family Medicine | Admitting: Family Medicine

## 2024-01-30 ENCOUNTER — Encounter (HOSPITAL_COMMUNITY): Payer: Self-pay

## 2024-01-30 DIAGNOSIS — E1122 Type 2 diabetes mellitus with diabetic chronic kidney disease: Secondary | ICD-10-CM | POA: Diagnosis present

## 2024-01-30 DIAGNOSIS — M19079 Primary osteoarthritis, unspecified ankle and foot: Secondary | ICD-10-CM

## 2024-01-30 DIAGNOSIS — J439 Emphysema, unspecified: Secondary | ICD-10-CM | POA: Diagnosis present

## 2024-01-30 DIAGNOSIS — I2510 Atherosclerotic heart disease of native coronary artery without angina pectoris: Secondary | ICD-10-CM | POA: Diagnosis not present

## 2024-01-30 DIAGNOSIS — Z8673 Personal history of transient ischemic attack (TIA), and cerebral infarction without residual deficits: Secondary | ICD-10-CM | POA: Diagnosis not present

## 2024-01-30 DIAGNOSIS — Z90722 Acquired absence of ovaries, bilateral: Secondary | ICD-10-CM

## 2024-01-30 DIAGNOSIS — Z86718 Personal history of other venous thrombosis and embolism: Secondary | ICD-10-CM

## 2024-01-30 DIAGNOSIS — G43109 Migraine with aura, not intractable, without status migrainosus: Secondary | ICD-10-CM | POA: Diagnosis not present

## 2024-01-30 DIAGNOSIS — R1310 Dysphagia, unspecified: Secondary | ICD-10-CM | POA: Diagnosis not present

## 2024-01-30 DIAGNOSIS — I4891 Unspecified atrial fibrillation: Secondary | ICD-10-CM

## 2024-01-30 DIAGNOSIS — I130 Hypertensive heart and chronic kidney disease with heart failure and stage 1 through stage 4 chronic kidney disease, or unspecified chronic kidney disease: Secondary | ICD-10-CM | POA: Diagnosis not present

## 2024-01-30 DIAGNOSIS — Z90710 Acquired absence of both cervix and uterus: Secondary | ICD-10-CM

## 2024-01-30 DIAGNOSIS — M4715 Other spondylosis with myelopathy, thoracolumbar region: Secondary | ICD-10-CM

## 2024-01-30 DIAGNOSIS — R29706 NIHSS score 6: Secondary | ICD-10-CM | POA: Diagnosis not present

## 2024-01-30 DIAGNOSIS — R29818 Other symptoms and signs involving the nervous system: Secondary | ICD-10-CM | POA: Diagnosis not present

## 2024-01-30 DIAGNOSIS — R471 Dysarthria and anarthria: Secondary | ICD-10-CM | POA: Diagnosis not present

## 2024-01-30 DIAGNOSIS — I6300 Cerebral infarction due to thrombosis of unspecified precerebral artery: Secondary | ICD-10-CM | POA: Diagnosis not present

## 2024-01-30 DIAGNOSIS — Z833 Family history of diabetes mellitus: Secondary | ICD-10-CM | POA: Diagnosis not present

## 2024-01-30 DIAGNOSIS — I6523 Occlusion and stenosis of bilateral carotid arteries: Secondary | ICD-10-CM | POA: Diagnosis not present

## 2024-01-30 DIAGNOSIS — N3020 Other chronic cystitis without hematuria: Secondary | ICD-10-CM | POA: Diagnosis not present

## 2024-01-30 DIAGNOSIS — M160 Bilateral primary osteoarthritis of hip: Secondary | ICD-10-CM

## 2024-01-30 DIAGNOSIS — M47812 Spondylosis without myelopathy or radiculopathy, cervical region: Secondary | ICD-10-CM

## 2024-01-30 DIAGNOSIS — G894 Chronic pain syndrome: Secondary | ICD-10-CM

## 2024-01-30 DIAGNOSIS — G629 Polyneuropathy, unspecified: Secondary | ICD-10-CM

## 2024-01-30 DIAGNOSIS — I712 Thoracic aortic aneurysm, without rupture, unspecified: Secondary | ICD-10-CM | POA: Diagnosis not present

## 2024-01-30 DIAGNOSIS — Z7982 Long term (current) use of aspirin: Secondary | ICD-10-CM

## 2024-01-30 DIAGNOSIS — G8194 Hemiplegia, unspecified affecting left nondominant side: Secondary | ICD-10-CM | POA: Diagnosis present

## 2024-01-30 DIAGNOSIS — I6381 Other cerebral infarction due to occlusion or stenosis of small artery: Secondary | ICD-10-CM | POA: Diagnosis not present

## 2024-01-30 DIAGNOSIS — I10 Essential (primary) hypertension: Secondary | ICD-10-CM

## 2024-01-30 DIAGNOSIS — Z789 Other specified health status: Secondary | ICD-10-CM | POA: Diagnosis not present

## 2024-01-30 DIAGNOSIS — I6389 Other cerebral infarction: Secondary | ICD-10-CM | POA: Diagnosis not present

## 2024-01-30 DIAGNOSIS — G959 Disease of spinal cord, unspecified: Secondary | ICD-10-CM

## 2024-01-30 DIAGNOSIS — Z743 Need for continuous supervision: Secondary | ICD-10-CM | POA: Diagnosis not present

## 2024-01-30 DIAGNOSIS — M4804 Spinal stenosis, thoracic region: Secondary | ICD-10-CM

## 2024-01-30 DIAGNOSIS — Z7902 Long term (current) use of antithrombotics/antiplatelets: Secondary | ICD-10-CM

## 2024-01-30 DIAGNOSIS — R4781 Slurred speech: Secondary | ICD-10-CM | POA: Diagnosis not present

## 2024-01-30 DIAGNOSIS — I639 Cerebral infarction, unspecified: Secondary | ICD-10-CM | POA: Diagnosis not present

## 2024-01-30 DIAGNOSIS — K118 Other diseases of salivary glands: Secondary | ICD-10-CM | POA: Diagnosis not present

## 2024-01-30 DIAGNOSIS — H903 Sensorineural hearing loss, bilateral: Secondary | ICD-10-CM | POA: Diagnosis present

## 2024-01-30 DIAGNOSIS — E7800 Pure hypercholesterolemia, unspecified: Secondary | ICD-10-CM | POA: Diagnosis present

## 2024-01-30 DIAGNOSIS — I63411 Cerebral infarction due to embolism of right middle cerebral artery: Secondary | ICD-10-CM | POA: Diagnosis not present

## 2024-01-30 DIAGNOSIS — N1831 Chronic kidney disease, stage 3a: Secondary | ICD-10-CM | POA: Diagnosis not present

## 2024-01-30 DIAGNOSIS — E1151 Type 2 diabetes mellitus with diabetic peripheral angiopathy without gangrene: Secondary | ICD-10-CM | POA: Diagnosis present

## 2024-01-30 DIAGNOSIS — Z961 Presence of intraocular lens: Secondary | ICD-10-CM | POA: Diagnosis present

## 2024-01-30 DIAGNOSIS — Z888 Allergy status to other drugs, medicaments and biological substances status: Secondary | ICD-10-CM

## 2024-01-30 DIAGNOSIS — Z79899 Other long term (current) drug therapy: Secondary | ICD-10-CM | POA: Diagnosis not present

## 2024-01-30 DIAGNOSIS — M48062 Spinal stenosis, lumbar region with neurogenic claudication: Secondary | ICD-10-CM

## 2024-01-30 DIAGNOSIS — R29810 Facial weakness: Secondary | ICD-10-CM | POA: Diagnosis not present

## 2024-01-30 DIAGNOSIS — R6889 Other general symptoms and signs: Secondary | ICD-10-CM | POA: Diagnosis not present

## 2024-01-30 DIAGNOSIS — E669 Obesity, unspecified: Secondary | ICD-10-CM | POA: Diagnosis present

## 2024-01-30 DIAGNOSIS — Z881 Allergy status to other antibiotic agents status: Secondary | ICD-10-CM

## 2024-01-30 DIAGNOSIS — M170 Bilateral primary osteoarthritis of knee: Secondary | ICD-10-CM

## 2024-01-30 DIAGNOSIS — I63511 Cerebral infarction due to unspecified occlusion or stenosis of right middle cerebral artery: Principal | ICD-10-CM

## 2024-01-30 DIAGNOSIS — I6930 Unspecified sequelae of cerebral infarction: Secondary | ICD-10-CM | POA: Diagnosis present

## 2024-01-30 DIAGNOSIS — I480 Paroxysmal atrial fibrillation: Secondary | ICD-10-CM | POA: Diagnosis present

## 2024-01-30 DIAGNOSIS — Z6830 Body mass index (BMI) 30.0-30.9, adult: Secondary | ICD-10-CM

## 2024-01-30 DIAGNOSIS — I6603 Occlusion and stenosis of bilateral middle cerebral arteries: Secondary | ICD-10-CM | POA: Diagnosis not present

## 2024-01-30 DIAGNOSIS — G63 Polyneuropathy in diseases classified elsewhere: Secondary | ICD-10-CM

## 2024-01-30 DIAGNOSIS — Z8249 Family history of ischemic heart disease and other diseases of the circulatory system: Secondary | ICD-10-CM | POA: Diagnosis not present

## 2024-01-30 DIAGNOSIS — I6782 Cerebral ischemia: Secondary | ICD-10-CM | POA: Diagnosis not present

## 2024-01-30 DIAGNOSIS — M150 Primary generalized (osteo)arthritis: Secondary | ICD-10-CM

## 2024-01-30 DIAGNOSIS — Z860101 Personal history of adenomatous and serrated colon polyps: Secondary | ICD-10-CM

## 2024-01-30 DIAGNOSIS — G8929 Other chronic pain: Secondary | ICD-10-CM

## 2024-01-30 DIAGNOSIS — Z9849 Cataract extraction status, unspecified eye: Secondary | ICD-10-CM

## 2024-01-30 DIAGNOSIS — M19071 Primary osteoarthritis, right ankle and foot: Secondary | ICD-10-CM

## 2024-01-30 DIAGNOSIS — F1190 Opioid use, unspecified, uncomplicated: Secondary | ICD-10-CM

## 2024-01-30 DIAGNOSIS — M461 Sacroiliitis, not elsewhere classified: Secondary | ICD-10-CM

## 2024-01-30 DIAGNOSIS — Z8711 Personal history of peptic ulcer disease: Secondary | ICD-10-CM

## 2024-01-30 DIAGNOSIS — Z87891 Personal history of nicotine dependence: Secondary | ICD-10-CM

## 2024-01-30 HISTORY — DX: Cerebral infarction, unspecified: I63.9

## 2024-01-30 LAB — CBC
HCT: 34.3 % — ABNORMAL LOW (ref 36.0–46.0)
Hemoglobin: 11 g/dL — ABNORMAL LOW (ref 12.0–15.0)
MCH: 28.6 pg (ref 26.0–34.0)
MCHC: 32.1 g/dL (ref 30.0–36.0)
MCV: 89.1 fL (ref 80.0–100.0)
Platelets: 211 10*3/uL (ref 150–400)
RBC: 3.85 MIL/uL — ABNORMAL LOW (ref 3.87–5.11)
RDW: 13.6 % (ref 11.5–15.5)
WBC: 3.9 10*3/uL — ABNORMAL LOW (ref 4.0–10.5)
nRBC: 0 % (ref 0.0–0.2)

## 2024-01-30 LAB — COMPREHENSIVE METABOLIC PANEL
ALT: 13 U/L (ref 0–44)
AST: 21 U/L (ref 15–41)
Albumin: 3.6 g/dL (ref 3.5–5.0)
Alkaline Phosphatase: 48 U/L (ref 38–126)
Anion gap: 13 (ref 5–15)
BUN: 21 mg/dL (ref 8–23)
CO2: 23 mmol/L (ref 22–32)
Calcium: 9.4 mg/dL (ref 8.9–10.3)
Chloride: 105 mmol/L (ref 98–111)
Creatinine, Ser: 1.22 mg/dL — ABNORMAL HIGH (ref 0.44–1.00)
GFR, Estimated: 43 mL/min — ABNORMAL LOW (ref >60)
Glucose, Bld: 100 mg/dL — ABNORMAL HIGH (ref 70–99)
Potassium: 3.7 mmol/L (ref 3.5–5.1)
Sodium: 141 mmol/L (ref 135–145)
Total Bilirubin: 0.8 mg/dL (ref 0.0–1.2)
Total Protein: 7.3 g/dL (ref 6.5–8.1)

## 2024-01-30 LAB — I-STAT CHEM 8, ED
BUN: 23 mg/dL (ref 8–23)
Calcium, Ion: 1.11 mmol/L — ABNORMAL LOW (ref 1.15–1.40)
Chloride: 105 mmol/L (ref 98–111)
Creatinine, Ser: 1.3 mg/dL — ABNORMAL HIGH (ref 0.44–1.00)
Glucose, Bld: 95 mg/dL (ref 70–99)
HCT: 34 % — ABNORMAL LOW (ref 36.0–46.0)
Hemoglobin: 11.6 g/dL — ABNORMAL LOW (ref 12.0–15.0)
Potassium: 3.6 mmol/L (ref 3.5–5.1)
Sodium: 141 mmol/L (ref 135–145)
TCO2: 24 mmol/L (ref 22–32)

## 2024-01-30 LAB — CBG MONITORING, ED: Glucose-Capillary: 91 mg/dL (ref 70–99)

## 2024-01-30 LAB — DIFFERENTIAL
Abs Immature Granulocytes: 0 10*3/uL (ref 0.00–0.07)
Basophils Absolute: 0 10*3/uL (ref 0.0–0.1)
Basophils Relative: 1 %
Eosinophils Absolute: 0.1 10*3/uL (ref 0.0–0.5)
Eosinophils Relative: 2 %
Immature Granulocytes: 0 %
Lymphocytes Relative: 26 %
Lymphs Abs: 1 10*3/uL (ref 0.7–4.0)
Monocytes Absolute: 0.3 10*3/uL (ref 0.1–1.0)
Monocytes Relative: 8 %
Neutro Abs: 2.5 10*3/uL (ref 1.7–7.7)
Neutrophils Relative %: 63 %

## 2024-01-30 LAB — PROTIME-INR
INR: 1.2 (ref 0.8–1.2)
Prothrombin Time: 14.9 s (ref 11.4–15.2)

## 2024-01-30 LAB — APTT: aPTT: 46 s — ABNORMAL HIGH (ref 24–36)

## 2024-01-30 LAB — ETHANOL: Alcohol, Ethyl (B): <10 mg/dL (ref <10)

## 2024-01-30 MED ORDER — LORAZEPAM 2 MG/ML IJ SOLN
1.0000 mg | INTRAMUSCULAR | Status: DC | PRN
  Administered 2024-01-30: 1 mg via INTRAVENOUS

## 2024-01-30 MED ORDER — HYDROMORPHONE HCL 1 MG/ML IJ SOLN
1.0000 mg | Freq: Once | INTRAMUSCULAR | Status: DC
  Filled 2024-01-30: qty 1

## 2024-01-30 MED ORDER — LABETALOL HCL 5 MG/ML IV SOLN
INTRAVENOUS | Status: AC
Start: 2024-01-30 — End: ?
  Filled 2024-01-30: qty 4

## 2024-01-30 MED ORDER — LORAZEPAM 2 MG/ML IJ SOLN
2.0000 mg | Freq: Once | INTRAMUSCULAR | Status: AC
  Administered 2024-01-30: 2 mg via INTRAVENOUS

## 2024-01-30 MED ORDER — HYDROMORPHONE HCL 1 MG/ML IJ SOLN: 1.0000 mg | Freq: Once | INTRAMUSCULAR | Status: AC

## 2024-01-30 MED ORDER — IOHEXOL 350 MG/ML SOLN
75.0000 mL | Freq: Once | INTRAVENOUS | Status: AC | PRN
  Administered 2024-01-30: 75 mL via INTRAVENOUS

## 2024-01-30 MED ORDER — LORAZEPAM 2 MG/ML IJ SOLN
INTRAMUSCULAR | Status: AC
  Filled 2024-01-30: qty 1

## 2024-01-30 MED ORDER — ENOXAPARIN SODIUM 40 MG/0.4ML IJ SOSY
40.0000 mg | PREFILLED_SYRINGE | INTRAMUSCULAR | Status: DC
  Filled 2024-01-30: qty 0.4

## 2024-01-30 MED ORDER — LORAZEPAM 2 MG/ML IJ SOLN: 1.0000 mg | Freq: Once | INTRAMUSCULAR | Status: DC

## 2024-01-30 MED ORDER — SODIUM CHLORIDE 0.9% FLUSH
3.0000 mL | Freq: Once | INTRAVENOUS | Status: AC
  Administered 2024-01-30: 3 mL via INTRAVENOUS

## 2024-01-30 MED ORDER — HYDROMORPHONE HCL 1 MG/ML IJ SOLN
INTRAMUSCULAR | Status: AC
  Administered 2024-01-30: 1 mg via INTRAVENOUS
  Filled 2024-01-30: qty 1

## 2024-01-30 NOTE — Plan of Care (Signed)
FMTS Brief Progress Note  S: Patient asleep on exam this evening.  Family at bedside reports that she was more awake but has since fallen back asleep.  Per their report she was able to talk with them and follow some commands earlier in the evening.  Dr. Sherrilee Gilles was able to discuss with the family the pros and cons of clot prevention versus preventing bleeds and brought up the idea of goals of care discussion.  The family has elected at this time to discuss with each other before coming to a decision on whether or not they would like palliative to be involved and how they would like to proceed.  O: BP (!) 136/112 (BP Location: Left Arm)   Pulse (!) 106   Temp 99.8 F (37.7 C) (Axillary)   Resp 18   Wt 68.6 kg   LMP 12/17/1962   SpO2 99%   BMI 30.55 kg/m   General: Somnolent female, no distress Cardiac: RRR no M/R/G Respiratory: CTAB, no increased work of breathing Abdomen: Flat, soft, nontender  A/P: Patient is admitted for CVA.  Family will have meeting with invested parties and try to come to a consensus on how to move forward.  They were encouraged to include Ms. Valladares as much as possible once she is more awake. - Orders reviewed. Labs for AM ordered, which was adjusted as needed.  - If condition changes, plan includes rapid reassessment and intervention based on patient condition at time of assessment.   Gerrit Heck, DO 01/30/2024, 9:19 PM PGY-1, [facility] Night Resident  Please page [phone] with questions.

## 2024-01-30 NOTE — Code Documentation (Signed)
Stroke Response Nurse Documentation Code Documentation  Nicole Perez is a 89 y.o. female arriving to [facility]  via [facility] on 01/30/2024 with past medical hx significant of essential hypertension, acute renal failure, impaired memory, CHF, paroxysmal atrial fibrillation, UTI, and hyperlipidemia. On No antithrombotic. Code stroke was activated by EMS.   Patient from home where she was LKW at [phone] and now complaining of left facial droop and slurred speech. En route EMS noted right gaze preference.    Stroke team at the bedside on patient arrival. Labs drawn and patient cleared for CT by EDP. Patient to CT with team. NIHSS 9, see documentation for details and code stroke times. Patient with right gaze preference , left hemianopia, left facial droop, dysarthria , and Visual  neglect on exam. The following imaging was completed:  CT Head, CTA, and MRI. Oat very agitated during CT. Given Dilaudid for hip pain and 2mg  ativan for agitation. Unable to obtain IV for CTP. Patient taken to MRI. Based on MRI results patient is a candidate for IV Thrombolytic however declined due to concern for hemorrhage. Patient is not a candidate for IR due to no LVO.   Care Plan: admit for stroke work up; q2 VS and NIHSS.   Bedside handoff with ED RN.    Ferman Hamming Stroke Response RN

## 2024-01-30 NOTE — Consult Note (Signed)
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 up around 0900AM and was wobbly on her walker and had a L facial droop. She as not a candidate for thrombectomy due to no LVO. I spoke with her daughters in detail and offered tnkase given DWI/FLAIR mismatch and they declined tnkase citing risk of ICH.  She has a hx of pafibb and seems like she is not on AC due to prior hx of GI bleed. May need to consider weighing in risks and benefits of AC again with her new stroke.  RECOMMENDATIONS  - Frequent Neuro checks per stroke unit protocol - Recommend obtaining TTE - Recommend obtaining Lipid panel with LDL - Please start statin if LDL > 70 - Recommend HbA1c to evaluate for diabetes and how well it is controlled. - Antithrombotic - consider using Aspirin 81mg  daily for now. Will need to wait a couple days and consider getting clearance from GI and discuss with family before consider  anticoagulation. - Recommend DVT ppx - SBP goal - permissive hypertension first 24 h < 220/110. Held home meds.  - Recommend Telemetry monitoring for arrythmia - Recommend bedside swallow screen prior to PO intake. - Stroke education booklet - Recommend PT/OT/SLP consult  ______________________________________________________________________   This patient is critically ill and at significant risk of neurological worsening, death and care requires constant monitoring of vital signs, hemodynamics,respiratory and cardiac monitoring, neurological assessment, discussion with family, other specialists and medical decision making of high complexity. I spent 105 minutes of neurocritical care time  in the care of  this patient. This was time spent independent of any time provided by nurse practitioner or PA.  Erick Blinks [facility] 01/30/2024  1:30 PM    Signed, Erick Blinks, MD [facility]

## 2024-01-30 NOTE — ED Notes (Addendum)
Pt moving the entire time a bladder scan was being done & after much assistance holding pt still the scanner read 0 as the results. Family at bedside very helpful in keeping pt safely in bed but refused hand mitts when offered d/t pt removing her clothing & pulling all cords and equipment leads. She does have grippy socks on over the pulse ox on her toe & a gown on backwards.

## 2024-01-30 NOTE — ED Provider Notes (Signed)
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 at [facility]). There is moderate stenosis at T12-L1 and multilevel foraminal stenosis. There has been progression of degeenrative changes compared to 10/18/09 MRI  EMG/NCS 10/20/13 showed decreased peroneal nerve function and chronic lumbar radiculopathy affecting L4 &L5 on the right and possibly affecting S1 on the right.  - Dr Frances Furbish though right foot decrease in sensation and right foot drop could be multifactorial icnluding traumatic injury to right foot and degenerative back disease.   - Cervical MRI 06/23/14 showed mulilevel cervical spondylosis that has progressed compared to MRI over 10 years prior.  Spinal Stenosis, Thoracic, maximal at  T10 -T11 by MRI 11/10 Spondylolisthesis, L4-5 by MRI 11/10.  Foraminal stenosis, bilaterally at L4 and at L5 by MRI 11/   Spinal stenosis of thoracic region 07/26/2011   Spinal Stenosis, Lumbar, diffuse thruoughout lumbar spine, maximal at L2-3 by MRI 11/10 (followed by Dr Estill Bamberg at [facility]) Spinal Stenosis, Thoracic, maximal at  T10 -T11 by MRI 11/10 Spondylolisthesis, L4-5 by MRI 11/10.  Foraminal stenosis, bilaterally at L4 and at L5 by MRI 11/10 Foraminal stenosis, right, T11 by MRI 11/10 S/P L3-4, L4-5 facet joint intra-articular injection, Dr Claria [facility])    ST segment depression 04/17/2016   Symptomatic anemia 04/09/2016   Thoracic aortic aneurysm without rupture (HCC) 06/08/2020   Chest CT 06/07/20 4.2 cm descending thoracic aortic aneurysm.  Recommend semi-annual imaging followup by CTA or MRA and referral to cardiothoracic surgery if not already obtained. This recommendation follows 2010 ACCF/AHA/AATS/ACR/ASA/SCA/SCAI/SIR/STS/SVM Guidelines for the Diagnosis and Management of Patients With Thoracic Aortic Disease. Circulation.    Urethral polyp 11/17/2015   Urinary retention 06/29/2019   UTI (urinary tract infection) 02/22/2014   Vitamin D deficiency 09/30/2012      Dispostion: Disposition decision including need for hospitalization was considered, and patient admitted to the hospital.    Final Clinical Impression(s) / ED Diagnoses Final diagnoses:  Acute ischemic stroke [facility])     This chart was dictated using voice recognition software.  Despite best efforts to proofread,  errors can occur which can change the documentation meaning.    Lonell Grandchild, MD 01/30/24 1345

## 2024-01-30 NOTE — Assessment & Plan Note (Addendum)
History of in and out caths at home for which her daughter assists in the setting of neurogenic bladder.  Will need routine care throughout hospitalization.  - Bladder scans q8h - Cath for >300cc on scan

## 2024-01-30 NOTE — H&P (Addendum)
Hospital Admission History and Physical Service Pager: [phone]  Patient name: Nicole Perez Medical record number: [id number] Date of Birth: 11-19-1933 Age: 89 y.o. Gender: female  Primary Care Provider: McDiarmid, Leighton Roach, MD Consultants: Neurology Code Status: Full code per daughter at bedside. Will discuss further when patient more awake Preferred Emergency Contact: Preferred contact is Raynaldo Opitz Information     Name Relation Home Work Mobile   Perez,Nicole Daughter   [phone]   Perez,Nicole Daughter [phone]  [phone]   Perez,Nicole Daughter   [phone]   Perez,Nicole Daughter   [phone]   Perez, Nicole   [phone]      Other Contacts   None on File      Chief Complaint: Left-sided facial droop and slurred speech  Assessment and Plan: LYLIAN Perez is a 89 y.o. female  with a PMHx of  HLD, HTN, DM CKD, CHF, Afib (Not on [facility]) presenting with slurred speech and left sided facial droop. Differential for presentation of this includes:   Cerebral vascular accident - most likely etiology given presentation of symptoms, imaging findings, underlying co-morbidities for chronic small vessel disease, and afib with the inability to begin anticoagulant therapy.  Complex migraine - complained of headache prior to onset of symptoms, unlikely given imaging findings of vascular disease and underlying co-morbidities.  HSV reactivation - unlikely given absence of skin findings Bell palsy - unlikely given imaging finding and sparing of CN III on initial exam   Assessment & Plan CVA (cerebral vascular accident) (HCC) Pt initially presented with dysarthria and left-sided facial droop. Head imaging revealed acute infarct involving the right MCA territory within the right frontal lobe/operculum. Additional findings of moderate-severe stenosis involving several areas throughout the rPCA, r/l ICAs, r/l MCAs, as well as chronic small vessel disease.  Unfortunately, given her history of significant GI bleed, she is unlikely to be a candidate for long-term anticoagulation therapy and is well outside the TNK window. Favor embolism versus small-vessel disease given underlying history, especially since she is unable to be on chronic anticoagulation for her atrial fibrillation.  Patient's somnolent upon exam likely in the setting of medication effect from receiving Ativan and Dilaudid in the ED.  Will keep NPO and add back diet and home meds as mentation improves. -Admit to FMTS, Attending: Dr. Luanne Bras  -Neurology consulted, appreciate recs -TTE ordered -Workup including lipid panel, A1c  -Consider ASA 81mg  pending discuss further with family given h/o severe GI bleed -Permissive HTN, SBP goal <220/110 in the first 24hr  -Neuroprotection measures: euglycemia, normothermia, normoxia, euvolemia -NPO pending improvement in mentation -PT/OT/SLP consults  -AM BMP -Vital signs per floor -Telemetry monitoring   Intermittent self-catheterization of bladder History of in and out caths at home for which her daughter assists in the setting of neurogenic bladder. Will need routine care throughout hospitalization.  -Bladder scans q8h -Cath for >300cc on scan   Chronic and Stable Problems:  CHF: Holding home lasix 20 mg as needed, patient appears euvolemic on exam  CKD3: Admission Cr 1.30,baseline 1.1-1.4, CTM AM BMP, avoid nephrotoxic agents as able  CAD/PAD: not currently on anti-lipid therapy, pending small-vessel workup  COPD/emphysema: albuterol inhaler prn  HTN: Allow permissive HTN, Holding home Losartan-HCTZ 100-12.5 daily, Metoprolol 25 mg nightly Afib: In A-fib upon presentation, rate controlled.  Not on anticoagulation, given history of GI bleeds   FEN/GI: NPO, pending SLP evaluation  VTE Prophylaxis: Lovenox   Disposition: Med tele  History of Present Illness:  Nicole Anschutz  Perez is a 89 y.o. female presenting following onset of left-sided  facial droop and slurred speech noticed around ~9:00am. LKW 6:45AM. Per family, the patient was with her daughter this morning who takes care of her cath when she was last seen normal. Around two hours later they noticed that she was complaining of being unable to hear from her hearing aid when they noticed her speech and facial droop. They report that she was also complaining of a headache at that time. Prior to this, she had been complaining of an intermittent headache in the three weeks leading up to this event. They did not notice any specific extremity weakness; however, they state that she was unable to focus her eyes forward and they continued to deviate to the right.   In the ED, she underwent imaging including CTH, CTA head, and MRI head w/ contrast which revealed an acute rMCA territory infarct involving the frontal lobe/operculum. PT/INT wnl, PTT slightly elevated. CMP, CBC unremarkable for her baseline.   Review Of Systems: Per HPI  Pertinent Past Medical History: CHF CKD 3 CAD COPD/emphysema HTN Glaucoma HLD PAD PUD Neuropathy History of DVT Thoracic aortic aneurysm Urinary retention Remainder reviewed in history tab.   Pertinent Past Surgical History: Bladder surgery Bilateral lumpectomy Laminectomy Parotidectomy Hysterectomy Remainder reviewed in history tab.   Pertinent Social History: Tobacco use: Never Alcohol use: Never Other Substance use: None Lives with daughter Zella Ball)  Pertinent Family History: Father: Stroke Remainder reviewed in history tab.   Important Outpatient Medications: *Last took meds yesterday  Albuterol as needed (using more recently) Bisacodyl 10 mg daily (occasionally) Doxycycline 100 mg twice daily (due to cathing, about 3 times per day) Lasix 20 mg as needed (unsure when she last took it) Hydrocodone-acetaminophen 10-3 25 every 8 hours as needed (taking on schedule) Losartan-HCTZ 100-12.5 daily Metoprolol 25 mg  nightly Pantoprazole 40 mg daily (occasionally) Pregabalin 25 mg twice daily (not taking)  Remainder reviewed in medication history.   Objective: BP (!) 164/62   Pulse 90   Temp (!) 97 F (36.1 C) (Temporal)   Resp 13   Wt 68.6 kg   LMP 12/17/1962   SpO2 100%   BMI 30.55 kg/m  Exam: General: Elderly appearing female with sonorous respirations in bed. NAD.  Cardiovascular: Irregularly, irregular rhythm. No appreciable murmurs, rubs, or gallops.  Respiratory: Breathing comfortably on RA. No wheezes, rales, or rhonchi on anterior lung exam.  Gastrointestinal: Soft, NTND. No guarding.  MSK/Extremities: Warm, well perfused. No edema.  Derm: No rashes on clothed exam.  Neuro: Moves all four extremities independently. Grip strength equal bilaterally. Exam limited due to significant drowsiness secondary to sedation from Ativan/Dilaudid given in ED.  Psych: Initially sleeping on exam. Arousable to external stimuli but remains drowsy.   Labs:  CBC BMET  Recent Labs  Lab 01/30/24 1109 01/30/24 1115  WBC 3.9*  --   HGB 11.0* 11.6*  HCT 34.3* 34.0*  PLT 211  --    Recent Labs  Lab 01/30/24 1109 01/30/24 1115  NA 141 141  K 3.7 3.6  CL 105 105  CO2 23  --   BUN 21 23  CREATININE 1.22* 1.30*  GLUCOSE 100* 95  CALCIUM 9.4  --     Pertinent additional labs: iCal: 1.11 Glucose: 95 Alcohol: Negative   EKG: Atrial fibrillation. Regular rate.   Imaging Studies Performed: CT ANGIO HEAD NECK W WO CM (CODE STROKE) Result Date: 01/30/2024 IMPRESSION: CTA neck: 1. The common carotid and internal  carotid arteries are patent within the neck without hemodynamically significant stenosis. Mild atherosclerotic plaque bilaterally, as described. 2. Vertebral arteries patent within the neck without stenosis or significant atherosclerotic disease. 3. Aortic Atherosclerosis (ICD10-I70.0) and Emphysema (ICD10-J43.9). CTA head: 1. Intracranial atherosclerotic disease with multifocal stenoses,  most notably as follows. 2. Severe near-occlusive stenosis of the right posterior cerebral artery P2 segment. 3. Up to moderate stenosis of the cavernous right internal carotid artery. 4. Moderate/severe stenosis of the cavernous left internal carotid artery. 5. Moderate stenosis of the right middle cerebral artery proximal M1 segment. 6. Severe stenosis of the left middle cerebral artery proximal M1 segment.   CT HEAD CODE STROKE WO CONTRAST Result Date: 01/30/2024 IMPRESSION: 1. 3 cm cortical/subcortical right PCA territory infarct within the right temporo-occipital lobes. This is new from the prior brain MRI of 08/12/2014 and appears chronic. However, consider a brain MRI to exclude acute on chronic ischemia at this site. 2. Additional small infarcts within the right caudate nucleus and right thalamus which are new from the prior brain MRI, but otherwise age-indeterminate. Consider a brain MRI for further evaluation. 3. Background parenchymal atrophy and chronic small vessel ischemic disease.   MRI Brain wo contrast:   IMPRESSION: 1. 3.9 x 4.2 cm acute cortical/subcortical right middle cerebral artery territory infarct within the mid right frontal lobe/frontal operculum. 2. Background parenchymal atrophy, chronic small vessel ischemic disease and chronic infarcts, as described. 3. Postoperative changes and multiple cystic/nodular lesions again noted within the right parotid space, not significantly changed from the prior brain MRI of 08/12/2014. Correlate with the surgical history. 4. Minor right ethmoid sinusitis   Lucia Estelle, Medical Student 01/30/2024, 3:16 PM [facility] FPTS Intern pager: [phone], text pages welcome Secure chat group [facility] Teaching Service

## 2024-01-30 NOTE — Assessment & Plan Note (Deleted)
Pt presented with slurred speech and left sided facial droop. LKN 1610 when seen by daughter this morning. Worsening symptoms and EMS transported. Code stroke activated. Imaging showed *** . Neurology following  - Neuro recs  - ECHO - SLP - PT/OT

## 2024-01-30 NOTE — ED Triage Notes (Addendum)
Ems reported family called 911 at 1040, last known normal at approximately 0700. Ems noted L sided facial drop and slurred speed. According to family Pt mentation otherwise at baseline. Pt had previously been recently complaining of Headaches  BP 210/108 Hr 92 Spo2 97% Cbg 124  ECG unremarkable but with many artifacts according to ems.   Pt then [facility] ED at 1107, CBG on arrival 70.

## 2024-01-30 NOTE — Assessment & Plan Note (Addendum)
Pt initially presented with dysarthria and left-sided facial droop. Head imaging revealed acute infarct involving the right MCA territory within the right frontal lobe/operculum. Additional findings of moderate-severe stenosis involving several areas throughout the rPCA, r/l ICAs, r/l MCAs, as well as chronic small vessel disease. Unfortunately, given her history of significant GI bleed, she is unlikely to be a candidate for long-term anticoagulation therapy and is well outside the TNK window. Favor embolism versus small-vessel disease given underlying history, especially since she is unable to be on chronic anticoagulation for her atrial fibrillation.  Patient's somnolent upon exam likely in the setting of medication effect from receiving Ativan and Dilaudid in the ED.  Will keep NPO and add back diet and home meds as mentation improves. -Admit to FMTS, Attending: Dr. Luanne Bras  -Neurology consulted, appreciate recs -TTE ordered -Workup including lipid panel, A1c  -Consider ASA 81mg  pending discuss further with family given h/o severe GI bleed -Permissive HTN, SBP goal <220/110 in the first 24hr  -Neuroprotection measures: euglycemia, normothermia, normoxia, euvolemia -NPO pending improvement in mentation -PT/OT/SLP consults  -AM BMP -Vital signs per floor -Telemetry monitoring

## 2024-01-31 ENCOUNTER — Encounter: Payer: Self-pay | Admitting: Family Medicine

## 2024-01-31 ENCOUNTER — Inpatient Hospital Stay (HOSPITAL_COMMUNITY): Payer: Medicare Other

## 2024-01-31 DIAGNOSIS — Z789 Other specified health status: Secondary | ICD-10-CM | POA: Diagnosis not present

## 2024-01-31 DIAGNOSIS — I6389 Other cerebral infarction: Secondary | ICD-10-CM

## 2024-01-31 DIAGNOSIS — I4891 Unspecified atrial fibrillation: Secondary | ICD-10-CM | POA: Diagnosis not present

## 2024-01-31 DIAGNOSIS — I63411 Cerebral infarction due to embolism of right middle cerebral artery: Secondary | ICD-10-CM | POA: Diagnosis not present

## 2024-01-31 DIAGNOSIS — I639 Cerebral infarction, unspecified: Secondary | ICD-10-CM | POA: Diagnosis not present

## 2024-01-31 LAB — BASIC METABOLIC PANEL
Anion gap: 13 (ref 5–15)
BUN: 16 mg/dL (ref 8–23)
CO2: 22 mmol/L (ref 22–32)
Calcium: 9 mg/dL (ref 8.9–10.3)
Chloride: 102 mmol/L (ref 98–111)
Creatinine, Ser: 1.12 mg/dL — ABNORMAL HIGH (ref 0.44–1.00)
GFR, Estimated: 47 mL/min — ABNORMAL LOW (ref >60)
Glucose, Bld: 88 mg/dL (ref 70–99)
Potassium: 3.3 mmol/L — ABNORMAL LOW (ref 3.5–5.1)
Sodium: 137 mmol/L (ref 135–145)

## 2024-01-31 LAB — LIPID PANEL
Cholesterol: 129 mg/dL (ref 0–200)
HDL: 37 mg/dL — ABNORMAL LOW (ref >40)
LDL Cholesterol: 78 mg/dL (ref 0–99)
Total CHOL/HDL Ratio: 3.5 {ratio}
Triglycerides: 68 mg/dL (ref <150)
VLDL: 14 mg/dL (ref 0–40)

## 2024-01-31 LAB — ECHOCARDIOGRAM COMPLETE
AR max vel: 1.78 cm2
AV Area VTI: 1.65 cm2
AV Area mean vel: 1.63 cm2
AV Mean grad: 3 mm[Hg]
AV Peak grad: 4.6 mm[Hg]
Ao pk vel: 1.07 m/s
Area-P 1/2: 4.41 cm2
S' Lateral: 2.5 cm
Weight: 2419.77 [oz_av]

## 2024-01-31 LAB — CBC
HCT: 32.5 % — ABNORMAL LOW (ref 36.0–46.0)
Hemoglobin: 10.6 g/dL — ABNORMAL LOW (ref 12.0–15.0)
MCH: 28.2 pg (ref 26.0–34.0)
MCHC: 32.6 g/dL (ref 30.0–36.0)
MCV: 86.4 fL (ref 80.0–100.0)
Platelets: 237 10*3/uL (ref 150–400)
RBC: 3.76 MIL/uL — ABNORMAL LOW (ref 3.87–5.11)
RDW: 13.5 % (ref 11.5–15.5)
WBC: 4.7 10*3/uL (ref 4.0–10.5)
nRBC: 0 % (ref 0.0–0.2)

## 2024-01-31 LAB — HEMOGLOBIN A1C
Hgb A1c MFr Bld: 5.4 % (ref 4.8–5.6)
Mean Plasma Glucose: 108.28 mg/dL

## 2024-01-31 LAB — GLUCOSE, CAPILLARY: Glucose-Capillary: 83 mg/dL (ref 70–99)

## 2024-01-31 MED ORDER — CLOPIDOGREL BISULFATE 75 MG PO TABS
75.0000 mg | ORAL_TABLET | Freq: Every day | ORAL | Status: DC
  Administered 2024-01-31 – 2024-02-01 (×2): 75 mg via ORAL
  Filled 2024-01-31 (×2): qty 1

## 2024-01-31 MED ORDER — ASPIRIN 81 MG PO TBEC
81.0000 mg | DELAYED_RELEASE_TABLET | Freq: Every day | ORAL | Status: DC
  Administered 2024-01-31 – 2024-02-01 (×2): 81 mg via ORAL
  Filled 2024-01-31 (×2): qty 1

## 2024-01-31 MED ORDER — ACETAMINOPHEN 325 MG PO TABS
650.0000 mg | ORAL_TABLET | Freq: Four times a day (QID) | ORAL | Status: DC | PRN
  Administered 2024-01-31 – 2024-02-01 (×3): 650 mg via ORAL
  Filled 2024-01-31 (×3): qty 2

## 2024-01-31 MED ORDER — POTASSIUM CHLORIDE CRYS ER 20 MEQ PO TBCR
40.0000 meq | EXTENDED_RELEASE_TABLET | Freq: Once | ORAL | Status: AC
  Administered 2024-01-31: 40 meq via ORAL
  Filled 2024-01-31: qty 2

## 2024-01-31 MED ORDER — ENOXAPARIN SODIUM 30 MG/0.3ML IJ SOSY
30.0000 mg | PREFILLED_SYRINGE | INTRAMUSCULAR | Status: DC
Start: 2024-01-31 — End: 2024-02-01
  Administered 2024-01-31: 30 mg via SUBCUTANEOUS
  Filled 2024-01-31: qty 0.3

## 2024-01-31 MED ORDER — ATORVASTATIN CALCIUM 40 MG PO TABS
40.0000 mg | ORAL_TABLET | Freq: Every day | ORAL | Status: DC
  Administered 2024-01-31 – 2024-02-01 (×2): 40 mg via ORAL
  Filled 2024-01-31 (×2): qty 1

## 2024-01-31 NOTE — Assessment & Plan Note (Addendum)
Not on anticoagulation secondary to having GI bleeds in the past.  Neurology recommended starting ASA 81 mg and Lovenox.  Family is amenable this AM. - ASA 81 mg daily - Palliative consult placed to discuss GOC with family regarding medication management

## 2024-01-31 NOTE — Evaluation (Signed)
Occupational Therapy Evaluation Patient Details Name: Nicole Perez MRN: [id number] DOB: 31-Mar-1933 Today's Date: 01/31/2024   History of Present Illness   pt is and 89 yo female presenting to [facility] ED on 2/13 with slurred speech and L facial droop. MRI positive for R MCA infarct, family refused TNK. PMH includes HTN, CKD 3, DVT, aortic aneurysm, COPD, chronic pain.     Clinical Impressions PTA pt lived with her daughter, has periods of the day where she is home alone, per family she is not very active but ambulates with RW and overall ind for ADLs with exception of supervision for showers. Pt currently able to stand and transfer with CGA, displays decreased safety awareness but overall cognition intact. No visual deficits noted upon assessment, Max A to setup for ADLs. Educated family on [employer] stroke education, pt would benefit from increased supervision at home upon DC. OT to continue following pt acutely to address listed deficits and help transition to next level of care. Patient would benefit from post acute Home OT services to help maximize functional independence in natural environment      If plan is discharge home, recommend the following:   Supervision due to cognitive status;Direct supervision/assist for medications management;Direct supervision/assist for financial management;Assistance with cooking/housework;A little help with bathing/dressing/bathroom     Functional Status Assessment   Patient has had a recent decline in their functional status and demonstrates the ability to make significant improvements in function in a reasonable and predictable amount of time.     Equipment Recommendations   None recommended by OT (pt has rec DME. Issued gait belt)     Recommendations for Other Services         Precautions/Restrictions   Precautions Precautions: Fall Restrictions Weight Bearing Restrictions Per Provider Order: No     Mobility Bed Mobility Overal bed  mobility: Needs Assistance Bed Mobility: Supine to Sit     Supine to sit: Contact guard, Used rails, HOB elevated          Transfers Overall transfer level: Needs assistance Equipment used: Rolling walker (2 wheels) Transfers: Sit to/from Stand, Bed to chair/wheelchair/BSC Sit to Stand: Contact guard assist Stand pivot transfers: Contact guard assist         General transfer comment: lack of safety awareness, body positioning and hand placement with STS transitions      Balance Overall balance assessment: Needs assistance Sitting-balance support: No upper extremity supported, Feet supported Sitting balance-Leahy Scale: Fair     Standing balance support: Bilateral upper extremity supported, During functional activity Standing balance-Leahy Scale: Poor Standing balance comment: RW support                           ADL either performed or assessed with clinical judgement   ADL Overall ADL's : Needs assistance/impaired Eating/Feeding: Set up;Sitting   Grooming: Set up;Sitting   Upper Body Bathing: Set up;Sitting   Lower Body Bathing: Minimal assistance;Sitting/lateral leans   Upper Body Dressing : Sitting;Set up   Lower Body Dressing: Maximal assistance;Sitting/lateral leans   Toilet Transfer: Contact guard assist;Rolling walker (2 wheels);Stand-pivot   Toileting- Architect and Hygiene: Sit to/from stand;Maximal assistance       Functional mobility during ADLs: Contact guard assist;Rolling walker (2 wheels) (pivot transfers) General ADL Comments: Limited assessment of pt independence due to being soiled and neededing to be efficiently cleaned. Pt making efforts to assist with wiping herself off     Vision Baseline  Vision/History: 1 Wears glasses Additional Comments: Occular ROM, tracking/scanning intact, pt able to read clock and sign on wall with glasses     Perception         Praxis         Pertinent Vitals/Pain Pain  Assessment Pain Assessment: No/denies pain     Extremity/Trunk Assessment Upper Extremity Assessment Upper Extremity Assessment: Overall WFL for tasks assessed (ROM WFL, MMT difficult to assess as pt HOH and not understanding her holding against resistance. Gross grip was weak but functoinal)   Lower Extremity Assessment Lower Extremity Assessment: Defer to PT evaluation       Communication Communication Communication: Impaired Factors Affecting Communication: Hearing impaired   Cognition Arousal: Alert Behavior During Therapy: WFL for tasks assessed/performed Cognition: Difficult to assess Difficult to assess due to: Hard of hearing/deaf (very HOH)           OT - Cognition Comments: Pt daughter reports she is not far from baseline- does not having hearing aides which are making it harder for her to comprehend. Online awareness intact                 Following commands: Intact (HOH so hard to assess)       Cueing  General Comments   Cueing Techniques: Verbal cues;Gestural cues;Tactile cues      Exercises     Shoulder Instructions      Home Living Family/patient expects to be discharged to:: Private residence Living Arrangements: Children (daughter stays there) Available Help at Discharge: Family;Available PRN/intermittently Type of Home: House Home Access: Ramped entrance     Home Layout: One level     Bathroom Shower/Tub: Sponge bathes at baseline;Walk-in shower   Bathroom Toilet: Handicapped height Bathroom Accessibility: Yes   Home Equipment: Agricultural consultant (2 wheels);Shower seat;Grab bars - tub/shower;Transport chair   Additional Comments: other family members help out      Prior Functioning/Environment Prior Level of Function : Independent/Modified Independent               ADLs Comments: supervision for showers    OT Problem List: Impaired balance (sitting and/or standing);Decreased safety awareness;Obesity   OT  Treatment/Interventions: Self-care/ADL training;DME and/or AE instruction;Therapeutic activities;Balance training;Therapeutic exercise;Patient/family education      OT Goals(Current goals can be found in the care plan section)   Acute Rehab OT Goals Patient Stated Goal: To get better; go home OT Goal Formulation: With family Time For Goal Achievement: 02/14/24 Potential to Achieve Goals: Good ADL Goals Pt Will Perform Grooming: with supervision;standing Pt Will Perform Lower Body Dressing: with supervision;sitting/lateral leans Pt Will Transfer to Toilet: with supervision;ambulating;bedside commode Additional ADL Goal #3: Pt will use proper hand placement during transfers without cueing to demonstrate improved safety awareness   OT Frequency:  Min 1X/week    Co-evaluation              AM-PAC OT "6 Clicks" Daily Activity     Outcome Measure Help from another person eating meals?: A Little Help from another person taking care of personal grooming?: A Little Help from another person toileting, which includes using toliet, bedpan, or urinal?: A Lot Help from another person bathing (including washing, rinsing, drying)?: A Little Help from another person to put on and taking off regular upper body clothing?: A Little Help from another person to put on and taking off regular lower body clothing?: A Lot 6 Click Score: 16   End of Session Equipment Utilized During Treatment: Gait belt;Rolling walker (  2 wheels) Nurse Communication: Mobility status  Activity Tolerance: Patient tolerated treatment well Patient left: in chair;with call bell/phone within reach;with family/visitor present;with chair alarm set  OT Visit Diagnosis: Cognitive communication deficit (R41.841);Unsteadiness on feet (R26.81) Symptoms and signs involving cognitive functions: Cerebral infarction (R MCA)                Time: 4098-1191 OT Time Calculation (min): 31 min Charges:  OT General Charges $OT Visit: 1  Visit OT Evaluation $OT Eval Low Complexity: 1 Low OT Treatments $Self Care/Home Management : 8-22 mins  01/31/2024  AB, OTR/L  Acute Rehabilitation Services  Office: [phone]   Tristan Schroeder 01/31/2024, 1:41 PM

## 2024-01-31 NOTE — Plan of Care (Signed)
  Problem: SLP Dysphagia Goals Goal: Misc Dysphagia Goal Flowsheets (Taken 01/31/2024 1059) Misc Dysphagia Goal: Pt will tolerate a mechanical soft diet and thin liquids without overt concerns for aspiration.
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
## 2024-01-31 NOTE — Hospital Course (Addendum)
Nicole Perez is a 89 y.o. female who was admitted to the [facility] Medicine Teaching Service at [facility] for CVA. Hospital course is outlined below by problem.   CVA  Patient presented with dysarthria and L sided facial droop. Imaging revealed an acute infarct involving the R MCA territory within the R frontal lobe/operculum along with moderate-severe stenoses involving several areas.  Unfortunately due to her history of significant GI bleeds, Eliquis was previously discontinued and family refused TNK upon arrival.  Her neuro exam was notable for slight left-sided facial droop and mild LUE weakness.  Echo revealed EF 60-65%, no RWMA, LA mildly dilated, AVR trivial, > 50% IVC respiratory variability.  Upon multiple discussions regarding risks vs benefits of DAPT vs restarting Eliquis vs not treating due to risk of GI bleeding.  Family decided that they would like to restart Eliquis 5 mg BID to prevent strokes in the future and hold off on DAPT.  Lipitor 40 mg daily was also initiated prior to discharge.  Atrial Fibrillation Was previously off of Eliquis due to having GI bleeds in the past.  Restarted Eliquis upon discharge.  Recommend discussing risk versus benefits further with family if desired.  Could also consider lowering Eliquis to 2.5 mg BID d/t risk.  Intermittent Self Catheterization of Bladder History of in and out caths at home for which her daughter assists in the setting of neurogenic bladder.  Care continued during hospitalization with bladder scans.  Hypertension Lowered her Losartan-HCTZ dose to 25-12.5 mg daily due to lower blood pressures in the hospital.  Recommend titration as needed outpatient.  Other conditions that were chronic and stable were managed with home medications or formulary alternatives (CHF, CKD3, CAD/PAD, COPD/emphysema,  Issues for follow up: Discussions about Eliquis with family.  Consider lowering dose to 2.5 mg due to history and increased risk of GI  bleeds. Titrate BP medications as needed. Recheck CBC and BMP outpatient. Reassess neurological deficits.

## 2024-01-31 NOTE — Plan of Care (Signed)
  Problem: Clinical Measurements: Goal: Ability to maintain clinical measurements within normal limits will improve Outcome: Progressing Goal: Will remain free from infection Outcome: Progressing Goal: Diagnostic test results will improve Outcome: Progressing Goal: Respiratory complications will improve Outcome: Progressing Goal: Cardiovascular complication will be avoided Outcome: Progressing   Problem: Safety: Goal: Ability to remain free from injury will improve Outcome: Progressing   Problem: Skin Integrity: Goal: Risk for impaired skin integrity will decrease Outcome: Progressing   Problem: Pain Managment: Goal: General experience of comfort will improve and/or be controlled Outcome: Progressing   Problem: Elimination: Goal: Will not experience complications related to bowel motility Outcome: Progressing Goal: Will not experience complications related to urinary retention Outcome: Progressing

## 2024-01-31 NOTE — Evaluation (Signed)
Physical Therapy Evaluation Patient Details Name: Nicole Perez MRN: [id number] DOB: 1932/12/28 Today's Date: 01/31/2024  History of Present Illness  pt is and 89 yo female presenting to [facility] ED on 2/13 with slurred speech and L facial droop. MRI positive for R MCA infarct, family refused TNK. PMH includes HTN, CKD 3, DVT, aortic aneurysm, COPD, chronic pain.  Clinical Impression  PTA daughter living with pt in single story home with ramped entrance. Daughter reports pt can have someone with her at all times. Pt was ambulating at home with RW and able to perform ADLs, has supervision for showers. Pt is currently limited in safe mobility by decreased safety awareness using RW in novel location, in presence of generalized weakness and mild balance deficits. Pt is min A for bed mobility, contact guard for transfers and min A for ambulation in room with RW. PT recommending HHPT at discharge. PT will continue to follow acutely.        If plan is discharge home, recommend the following: A little help with walking and/or transfers;A little help with bathing/dressing/bathroom;Assistance with cooking/housework;Direct supervision/assist for medications management;Direct supervision/assist for financial management;Assist for transportation;Help with stairs or ramp for entrance;Supervision due to cognitive status   Can travel by private vehicle    Yes    Equipment Recommendations None recommended by PT     Functional Status Assessment Patient has had a recent decline in their functional status and demonstrates the ability to make significant improvements in function in a reasonable and predictable amount of time.     Precautions / Restrictions Precautions Precautions: Fall Restrictions Weight Bearing Restrictions Per Provider Order: No      Mobility  Bed Mobility Overal bed mobility: Needs Assistance Bed Mobility: Supine to Sit, Sit to Supine     Supine to sit: HOB elevated, Used rails, Min  assist Sit to supine: Min assist   General bed mobility comments: min A for bringing trunk to upright, with increased time and effort and use of bed rail able to scoot hips to EoB, daughter provides min A for pt to return LE to bed, discussed allowing pt to perform as she could have done it with a little increased time    Transfers Overall transfer level: Needs assistance Equipment used: Rolling walker (2 wheels) Transfers: Sit to/from Stand Sit to Stand: Contact guard assist           General transfer comment: able to stand with contact guard assisst, had pt stand still and assess, no dizziness or lightheadedness as pt was determined to walk to door    Ambulation/Gait Ambulation/Gait assistance: Min assist Gait Distance (Feet): 20 Feet   Gait Pattern/deviations: Step-through pattern, Trunk flexed, Shuffle Gait velocity: slowed Gait velocity interpretation: <1.31 ft/sec, indicative of household ambulator   General Gait Details: min A for management of RW around obstacles in room, unable to hear any cues, and decreased acknowledgement of tactile and gestural cues. slowed, shuffling, gait, with trunk flexed        Balance Overall balance assessment: Needs assistance Sitting-balance support: No upper extremity supported, Feet supported Sitting balance-Leahy Scale: Fair     Standing balance support: Bilateral upper extremity supported, During functional activity Standing balance-Leahy Scale: Poor Standing balance comment: RW support                             Pertinent Vitals/Pain  No denies pain     Home Living Family/patient expects to  be discharged to:: Private residence Living Arrangements: Children (daughter stays there) Available Help at Discharge: Family;Available PRN/intermittently Type of Home: House Home Access: Ramped entrance       Home Layout: One level Home Equipment: Agricultural consultant (2 wheels);Shower seat;Grab bars - tub/shower;Transport  chair Additional Comments: other family members help out    Prior Function Prior Level of Function : Independent/Modified Independent             Mobility Comments: ambulates modI with RW in home ADLs Comments: supervision for showers     Extremity/Trunk Assessment   Upper Extremity Assessment Upper Extremity Assessment: Defer to OT evaluation    Lower Extremity Assessment Lower Extremity Assessment: Overall WFL for tasks assessed;Generalized weakness (lack of hearing barrier to standardized testing,)       Communication   Communication Communication: Impaired Factors Affecting Communication: Hearing impaired    Cognition Arousal: Alert Behavior During Therapy: WFL for tasks assessed/performed                             Following commands: Intact (HOH so hard to assess)       Cueing Cueing Techniques: Verbal cues, Gestural cues, Tactile cues     General Comments General comments (skin integrity, edema, etc.): increased family members in room, pt with difficulty to focus on therapist, pt able to stand as family provided removal of soiled brief and pericare before return to bed.        Assessment/Plan    PT Assessment Patient needs continued PT services  PT Problem List Decreased strength;Decreased balance;Decreased coordination;Decreased knowledge of use of DME       PT Treatment Interventions DME instruction;Gait training;Functional mobility training;Therapeutic activities;Therapeutic exercise;Balance training;Patient/family education    PT Goals (Current goals can be found in the Care Plan section)  Acute Rehab PT Goals PT Goal Formulation: With patient/family Time For Goal Achievement: 02/14/24 Potential to Achieve Goals: Fair    Frequency Min 1X/week        AM-PAC PT "6 Clicks" Mobility  Outcome Measure Help needed turning from your back to your side while in a flat bed without using bedrails?: None Help needed moving from lying on  your back to sitting on the side of a flat bed without using bedrails?: A Little Help needed moving to and from a bed to a chair (including a wheelchair)?: A Little Help needed standing up from a chair using your arms (e.g., wheelchair or bedside chair)?: A Little Help needed to walk in hospital room?: A Little Help needed climbing 3-5 steps with a railing? : A Lot 6 Click Score: 18    End of Session Equipment Utilized During Treatment: Gait belt Activity Tolerance: Patient tolerated treatment well Patient left: in bed;with call bell/phone within reach;with family/visitor present;with nursing/sitter in room Nurse Communication: Mobility status PT Visit Diagnosis: Other abnormalities of gait and mobility (R26.89);Muscle weakness (generalized) (M62.81)    Time: 1610-9604 PT Time Calculation (min) (ACUTE ONLY): 31 min   Charges:   PT Evaluation $PT Eval Moderate Complexity: 1 Mod PT Treatments $Gait Training: 8-22 mins PT General Charges $$ ACUTE PT VISIT: 1 Visit         Ishaaq Penna B. Beverely Risen PT, DPT Acute Rehabilitation Services Please use secure chat or  Call Office [phone]   Elon Alas [facility] 01/31/2024, 4:50 PM

## 2024-01-31 NOTE — Progress Notes (Addendum)
STROKE TEAM PROGRESS NOTE   INTERIM HISTORY/SUBJECTIVE She had has remained hemodynamically stable and afebrile overnight.  She is hard of hearing but is able to communicate easily with her family at the bedside. Patient and daughter feel her speech and facial droop are improving but not back to baseline MRI scan shows a small frontal MCA branch embolic infarct.  CT angiogram shows moderate right M1, severe left M1 and right PCA stenosis patient has remote history of GI hemorrhage on Eliquis and has been off anticoagulation or antiplatelet therapy now for several years OBJECTIVE  CBC    Component Value Date/Time   WBC 4.7 01/31/2024 0743   RBC 3.76 (L) 01/31/2024 0743   HGB 10.6 (L) 01/31/2024 0743   HGB 10.6 (L) 11/28/2023 1623   HCT 32.5 (L) 01/31/2024 0743   HCT 32.7 (L) 11/28/2023 1623   PLT 237 01/31/2024 0743   PLT 215 11/28/2023 1623   MCV 86.4 01/31/2024 0743   MCV 91 11/28/2023 1623   MCH 28.2 01/31/2024 0743   MCHC 32.6 01/31/2024 0743   RDW 13.5 01/31/2024 0743   RDW 13.3 11/28/2023 1623   LYMPHSABS 1.0 01/30/2024 1109   LYMPHSABS 1.3 06/21/2022 1204   MONOABS 0.3 01/30/2024 1109   EOSABS 0.1 01/30/2024 1109   EOSABS 0.1 06/21/2022 1204   BASOSABS 0.0 01/30/2024 1109   BASOSABS 0.1 06/21/2022 1204    BMET    Component Value Date/Time   NA 137 01/31/2024 0743   NA 142 05/30/2023 1031   K 3.3 (L) 01/31/2024 0743   CL 102 01/31/2024 0743   CO2 22 01/31/2024 0743   GLUCOSE 88 01/31/2024 0743   BUN 16 01/31/2024 0743   BUN 34 (H) 05/30/2023 1031   CREATININE 1.12 (H) 01/31/2024 0743   CREATININE 0.78 04/16/2016 1649   CALCIUM 9.0 01/31/2024 0743   EGFR 44 (L) 05/30/2023 1031   GFRNONAA 47 (L) 01/31/2024 0743   GFRNONAA 72 04/16/2016 1649    IMAGING past 24 hours MR BRAIN WO CONTRAST Result Date: 01/30/2024 CLINICAL DATA:  Provided history: Neuro deficit, acute, stroke suspected. EXAM: MRI HEAD WITHOUT CONTRAST TECHNIQUE: Multiplanar, multiecho pulse  sequences of the brain and surrounding structures were obtained without intravenous contrast. COMPARISON:  Non-contrast head CT performed earlier today 01/30/2024. Brain MRI 08/12/2014. FINDINGS: Brain: Mild-to-moderate generalized cerebral atrophy. Acute cortical/subcortical right MCA territory infarct within the mid right frontal lobe/frontal operculum, measuring 3.9 x 1.8 x 4.2 cm in transaxial dimensions. Moderate-sized chronic cortical/subcortical infarct again demonstrated within the right temporo-occipital lobes, new from the prior brain MRI of 08/12/2014 (PCA vascular territory). Unchanged chronic lacunar infarct within the left caudate nucleus. Chronic lacunar infarcts within the right caudate nucleus and bilateral thalami, new from the prior MRI. Background moderate multifocal T2 FLAIR hyperintense signal abnormality within the cerebral white matter, nonspecific but compatible with chronic small vessel ischemic disease. Mild chronic small vessel ischemic changes also present within the pons. These findings have progressed from the prior MRI. No evidence of an intracranial mass. No extra-axial fluid collection. No midline shift. Vascular: Please see CT angiogram head/neck performed earlier today. Skull and upper cervical spine: No focal worrisome marrow lesion. Incompletely assessed cervical spondylosis. Sinuses/Orbits: No mass or acute finding within the imaged orbits. Prior bilateral ocular lens replacement. T2 hyperintense opacification of an anterior right ethmoid air cell. Postsurgical appearance of the paranasal sinuses and passages. Other: Trace Fluid within the left mastoid air cells. Postoperative changes and multiple cystic/nodular lesions again noted within the right parotid  space, not significantly changed from the prior brain MRI of 08/12/2014. Impression #1 called by telephone at the time of interpretation on 01/30/2024 at 11:30 am to provider [facility] , who verbally acknowledged these  results. IMPRESSION: 1. 3.9 x 4.2 cm acute cortical/subcortical right middle cerebral artery territory infarct within the mid right frontal lobe/frontal operculum. 2. Background parenchymal atrophy, chronic small vessel ischemic disease and chronic infarcts, as described. 3. Postoperative changes and multiple cystic/nodular lesions again noted within the right parotid space, not significantly changed from the prior brain MRI of 08/12/2014. Correlate with the surgical history. 4. Minor right ethmoid sinusitis. Electronically Signed   By: Jackey Loge D.O.   On: 01/30/2024 13:38    Vitals:   01/30/24 2045 01/30/24 2238 01/31/24 0410 01/31/24 0800  BP:  (!) 125/91 (!) 149/112 (!) 164/97  Pulse: (!) 106 (!) 105 (!) 106 91  Resp: 18 19 19 18   Temp: 99.8 F (37.7 C) 99.4 F (37.4 C) 98.1 F (36.7 C) 99.3 F (37.4 C)  TempSrc: Axillary Axillary Axillary Oral  SpO2: 99% 99% 98% 100%  Weight:         PHYSICAL EXAM General:  Alert, well-nourished, well-developed pleasant elderly African-American lady in no acute distress Psych:  Mood and affect appropriate for situation CV: Regular rate and rhythm on monitor Respiratory:  Regular, unlabored respirations on room air GI: Abdomen soft and nontender   NEURO:  Mental Status: Responds to name but is unable to state place time and situation Speech/Language: speech is with moderate dysarthria, difficult to communicate with patient as she is hard of hearing  Cranial Nerves:  II: PERRL.  III, IV, VI: EOMI. Eyelids elevate symmetrically.  VII: Mild left facial droop VIII: hearing intact to voice. IX, X: Voice is dysarthric XII: tongue is midline without fasciculations. Motor: Able to move all 4 extremities with antigravity strength, but some weakness of left arm and orbiting of right arm around left arm Tone: is normal and bulk is normal Sensation- Intact to light touch bilaterally.  Coordination: FTN intact bilaterally Gait- deferred  Most  Recent NIH  1a Level of Conscious.: 0 1b LOC Questions: 2 1c LOC Commands: 0 2 Best Gaze: 0 3 Visual: 0 4 Facial Palsy: 1 5a Motor Arm - left: 0 5b Motor Arm - Right: 0 6a Motor Leg - Left: 1 6b Motor Leg - Right: 1 7 Limb Ataxia: 0 8 Sensory: 0 9 Best Language: 0 10 Dysarthria: 1 11 Extinct. and Inatten.: 0 TOTAL: 6   ASSESSMENT/PLAN  Ms. JANASHA BARKALOW is a 89 y.o. female with history of hypertension, hyperlipidemia and A-fib not on anticoagulation due to history of GI bleed admitted for acute onset left facial droop and slurred speech with right gaze preference.  MRI reveals right MCA territory stroke.  Discussed anticoagulation with patient's family, but they have opted not to initiate anticoagulation due to history of GI bleed on Eliquis.  NIH on Admission 7  Acute Ischemic Infarct:  right MCA territory stroke Etiology: Likely cardioembolic in the setting of A-fib without anticoagulation Code Stroke CT head chronic right PCA infarct, additional small infarcts in right caudate nucleus and right thalamus, age-indeterminate Small vessel disease. Atrophy. ASPECTS 9 CTA head & neck no LVO, severe stenosis of right PCA P2 stenotic bed, moderate stenosis of cavernous right ICA, moderate to severe stenosis of cavernous left ICA, moderate stenosis of right MCA M1 segment, severe stenosis of left MCA M1 segment MRI acute cortical and subcortical right  MCA territory infarct within mid right frontal lobe 2D Echo pending LDL 78 HgbA1c 5.4 VTE prophylaxis -Lovenox No antithrombotic prior to admission, now on aspirin 81 mg daily and clopidogrel 75 mg daily for months and then aspirin alone. Therapy recommendations:  Pending Disposition: Pending  Hx of Stroke/TIA Patient has several old infarcts seen on CT  Atrial fibrillation Home Meds: Metoprolol 25 mg daily Continue telemetry monitoring Discussed anticoagulation with patient's family, and due to risk of GI bleed, decision was made  to proceed with DAPT  Hypertension Home meds: None Stable Blood Pressure Goal: BP less than 220/110   Hyperlipidemia Home meds: None LDL 78, goal < 70 Add atorvastatin 40 mg daily Continue statin at discharge  Dysphagia Patient has post-stroke dysphagia, SLP consulted    Diet   DIET DYS 3 Fluid consistency: Thin   Advance diet as tolerated  Other Stroke Risk Factors Obesity, Body mass index is 30.55 kg/m., BMI >/= 30 associated with increased stroke risk, recommend weight loss, diet and exercise as appropriate    Other Active Problems None  Hospital day # 1  Patient seen by NP with MD, MD to edit note as needed. Cortney E Ernestina Columbia , MSN, AGACNP-BC [facility] See Amion for schedule and pager information 01/31/2024 12:33 PM   STROKE MD NOTE :  I have personally obtained history,examined this patient, reviewed notes, independently viewed imaging studies, participated in medical decision making and plan of care.ROS completed by me personally and pertinent positives fully documented  I have made any additions or clarifications directly to the above note. Agree with note above.  Patient with A-fib not on anticoagulation due to remote history of GI bleed on Eliquis presents with slurred speech and left facial droop due to right MCA frontal branch infarct likely of cardioembolic etiology even though she does have multivessel moderate to severe stenosis and anterior and posterior circulation.  Long discussion with the patient, daughter and granddaughter about risk-benefit of anticoagulation with Eliquis or alternative medications and family seems clean to avoid strong blood thinners and so recommend aspirin 81 mg and Plavix 75 mg daily for 3 months followed by aspirin alone.  Patient is not a candidate for Watchman device due to advanced age and medical comorbidities at this time.  Continue ongoing stroke workup.  Aggressive risk factor modification.  Mobilize out of bed.   Therapy consults.  Greater than 50% time during this 50-minute visit was spent in counseling and coordination of care about embolic stroke and A-fib and discussion about risk-benefit of anticoagulation in the setting of GI hemorrhage history and answering questions  Delia Heady, MD Medical Director [facility] Pager: [phone] 01/31/2024 2:29 PM  To contact Stroke Continuity provider, please refer to [email or web address]. After hours, contact General Neurology

## 2024-01-31 NOTE — Progress Notes (Signed)
Daily Progress Note Intern Pager: [phone]  Patient name: Nicole Perez Medical record number: [id number] Date of birth: 10-26-1933 Age: 89 y.o. Gender: female  Primary Care Provider: McDiarmid, Leighton Roach, MD Consultants: Neurology Code Status: Full Code  Pt Overview and Major Events to Date:  2/13: Admitted  Assessment and Plan: Nicole Perez is a 89 y.o. female  with a PMHx of  HLD, HTN, DM CKD, CHF, Afib (Not on [facility]) presenting with slurred speech and left sided facial droop admitted for stroke workup.  Imaging revealed an acute infarct involving the R MCA territory within the right frontal lobe with numerous areas of stenosis. Assessment & Plan CVA (cerebral vascular accident) (HCC) Pt initially presented with dysarthria and left-sided facial droop.  Head imaging revealed acute infarct involving the right MCA territory within the right frontal lobe/operculum.  Additional findings of moderate-severe stenosis involving several areas throughout the rPCA, r/l ICAs, r/l MCAs, as well as chronic small vessel disease.  Unfortunately, given her history of significant GI bleed, she is unlikely to be a candidate for long-term AC therapy and family refused TNK due to bleeding risk.  Favor embolism vs small-vessel disease given underlying history, especially since she is unable to be on chronic AC for her A-fib.  Somnolence s/p ativan and dilaudid has improved and patient is back to baseline mentation at this time.  She is hard of hearing, but otherwise neuro exam was notable for slight L-sided facial droop and mild LUE weakness.  Passed swallow screen, SLP recommended Dysphagia 3 diet.  LDL slightly elevated to 78 with hx of T2DM, A1c stable at 5.4%.  - Cardiac monitoring - Neurology consulted, appreciate recs - Echo pending - Neurology recommended starting ASA 81 mg daily - Started Lipitor 40 mg daily - BP permitting could consider restarting one BP med this PM - Neuroprotection measures:  euglycemia, normothermia, normoxia, euvolemia - PT/OT/SLP consulted, appreciate recommendations Atrial fibrillation (HCC) Not on anticoagulation secondary to having GI bleeds in the past.  Neurology recommended starting ASA 81 mg and Lovenox.  Family is amenable this AM. - ASA 81 mg daily - Palliative consult placed to discuss GOC with family regarding medication management Intermittent self-catheterization of bladder History of in and out caths at home for which her daughter assists in the setting of neurogenic bladder.  Will need routine care throughout hospitalization.  - Bladder scans q8h - Cath for >300cc on scan   Chronic and Stable Problems:  CHF: Holding home lasix 20 mg as needed, patient appears euvolemic on exam  CKD3: Admission Cr 1.30,baseline 1.1-1.4, CTM AM BMP, avoid nephrotoxic agents as able  CAD/PAD: not currently on anti-lipid therapy, pending small-vessel workup  COPD/emphysema: albuterol inhaler prn  HTN: Allow permissive HTN, Holding home Losartan-HCTZ 100-12.5 daily, Metoprolol 25 mg nightly Afib: In A-fib upon presentation, rate controlled.  Not on anticoagulation, given history of GI bleeds   FEN/GI: Dysphagia 3 diet PPx: Lovenox Dispo: Pending workup and clinical improvement  Subjective:  Patient has returned to baseline this warning, confirmed with daughter.  She is hard of hearing but is able to follow commands and perform physical exam.  Her main concern was a headache this morning.  Objective: Temp:  [97 F (36.1 C)-99.8 F (37.7 C)] 97.7 F (36.5 C) (02/14 1227) Pulse Rate:  [47-108] 104 (02/14 1227) Resp:  [13-19] 18 (02/14 1227) BP: (125-199)/(62-121) 163/121 (02/14 1227) SpO2:  [95 %-100 %] 97 % (02/14 1227) Physical Exam: General: Elderly appearing female in  NAD Cardiovascular: Irregularly irregular rhythm.  No appreciable M/R/G. Respiratory: CTAB.  Normal WOB on RA. Abdomen: Soft, nontender, nondistended.  Normoactive bowel  sounds. Extremities: No BLE edema. Neuro: Alert and oriented to name and birthday. CN II: PERRL CN III, IV,VI: EOMI CV V: Normal sensation in V1, V2, V3 CVII: Mild left-sided droop CN VIII: Has difficulty hearing at baseline CN IX,X: Symmetric palate raise  CN XI: 4/5 shoulder shrug L worse than R CN XII: Symmetric tongue protrusion  UE mild decrease in strength on L, but LE strength 5/5 Normal sensation in UE and LE bilaterally   Laboratory: Most recent CBC Lab Results  Component Value Date   WBC 4.7 01/31/2024   HGB 10.6 (L) 01/31/2024   HCT 32.5 (L) 01/31/2024   MCV 86.4 01/31/2024   PLT 237 01/31/2024   Most recent BMP    Latest Ref Rng & Units 01/31/2024    7:43 AM  BMP  Glucose 70 - 99 mg/dL 88   BUN 8 - 23 mg/dL 16   Creatinine 3.66 - 1.00 mg/dL 4.40   Sodium 347 - 425 mmol/L 137   Potassium 3.5 - 5.1 mmol/L 3.3   Chloride 98 - 111 mmol/L 102   CO2 22 - 32 mmol/L 22   Calcium 8.9 - 10.3 mg/dL 9.0    Imaging/Diagnostic Tests: No new imaging.  Fortunato Curling, DO 01/31/2024, 12:28 PM  PGY-1, [facility] FPTS Intern pager: [phone], text pages welcome Secure chat group [facility] Teaching Service

## 2024-01-31 NOTE — Progress Notes (Signed)
PT Cancellation Note  Patient Details Name: Nicole Perez MRN: [id number] DOB: 1933-06-04   Cancelled Treatment:    Reason Eval/Treat Not Completed: (P) Patient at procedure or test/unavailable ECHO tech getting ready for procedure. PT will follow back later this afternoon.   Alexanderia Gorby B. Beverely Risen PT, DPT Acute Rehabilitation Services Please use secure chat or  Call Office [phone]   Elon Alas [facility] 01/31/2024, 10:27 AM

## 2024-01-31 NOTE — Assessment & Plan Note (Addendum)
Pt initially presented with dysarthria and left-sided facial droop.  Head imaging revealed acute infarct involving the right MCA territory within the right frontal lobe/operculum.  Additional findings of moderate-severe stenosis involving several areas throughout the rPCA, r/l ICAs, r/l MCAs, as well as chronic small vessel disease.  Unfortunately, given her history of significant GI bleed, she is unlikely to be a candidate for long-term AC therapy and family refused TNK due to bleeding risk.  Favor embolism vs small-vessel disease given underlying history, especially since she is unable to be on chronic AC for her A-fib.  Somnolence s/p ativan and dilaudid has improved and patient is back to baseline mentation at this time.  She is hard of hearing, but otherwise neuro exam was notable for slight L-sided facial droop and mild LUE weakness.  Passed swallow screen, SLP recommended Dysphagia 3 diet.  LDL slightly elevated to 78 with hx of T2DM, A1c stable at 5.4%.  - Cardiac monitoring - Neurology consulted, appreciate recs - Echo pending - Neurology recommended starting ASA 81 mg daily - Started Lipitor 40 mg daily - BP permitting could consider restarting one BP med this PM - Neuroprotection measures: euglycemia, normothermia, normoxia, euvolemia - PT/OT/SLP consulted, appreciate recommendations

## 2024-01-31 NOTE — Assessment & Plan Note (Signed)
History of in and out caths at home for which her daughter assists in the setting of neurogenic bladder.  Will need routine care throughout hospitalization.  - Bladder scans q8h - Cath for >300cc on scan

## 2024-02-01 ENCOUNTER — Other Ambulatory Visit (HOSPITAL_COMMUNITY): Payer: Self-pay

## 2024-02-01 DIAGNOSIS — I6300 Cerebral infarction due to thrombosis of unspecified precerebral artery: Secondary | ICD-10-CM | POA: Diagnosis not present

## 2024-02-01 LAB — BASIC METABOLIC PANEL
Anion gap: 11 (ref 5–15)
BUN: 14 mg/dL (ref 8–23)
CO2: 24 mmol/L (ref 22–32)
Calcium: 9.4 mg/dL (ref 8.9–10.3)
Chloride: 105 mmol/L (ref 98–111)
Creatinine, Ser: 1.11 mg/dL — ABNORMAL HIGH (ref 0.44–1.00)
GFR, Estimated: 48 mL/min — ABNORMAL LOW (ref >60)
Glucose, Bld: 95 mg/dL (ref 70–99)
Potassium: 3.7 mmol/L (ref 3.5–5.1)
Sodium: 140 mmol/L (ref 135–145)

## 2024-02-01 LAB — MAGNESIUM: Magnesium: 1.8 mg/dL (ref 1.7–2.4)

## 2024-02-01 MED ORDER — ATORVASTATIN CALCIUM 40 MG PO TABS
40.0000 mg | ORAL_TABLET | Freq: Every day | ORAL | 0 refills | Status: DC
  Filled 2024-02-01: qty 30, 30d supply, fill #0

## 2024-02-01 MED ORDER — HYDROCODONE-ACETAMINOPHEN 10-325 MG PO TABS
1.0000 | ORAL_TABLET | Freq: Three times a day (TID) | ORAL | 0 refills | Status: DC | PRN
  Filled 2024-02-01: qty 90, 30d supply, fill #0

## 2024-02-01 MED ORDER — LOSARTAN POTASSIUM-HCTZ 50-12.5 MG PO TABS
1.0000 | ORAL_TABLET | Freq: Every day | ORAL | 0 refills | Status: DC
  Filled 2024-02-01: qty 30, 30d supply, fill #0

## 2024-02-01 MED ORDER — APIXABAN 5 MG PO TABS
5.0000 mg | ORAL_TABLET | Freq: Two times a day (BID) | ORAL | 0 refills | Status: DC
  Filled 2024-02-01: qty 60, 30d supply, fill #0

## 2024-02-01 MED ORDER — APIXABAN 5 MG PO TABS
5.0000 mg | ORAL_TABLET | Freq: Two times a day (BID) | ORAL | Status: DC
  Administered 2024-02-01: 5 mg via ORAL
  Filled 2024-02-01: qty 1

## 2024-02-01 NOTE — Assessment & Plan Note (Deleted)
 History of in and out caths at home for which her daughter assists in the setting of neurogenic bladder.  Will need routine care throughout hospitalization.  - Bladder scans q8h - Cath for >300cc on scan

## 2024-02-01 NOTE — Progress Notes (Signed)
Discharge instructions, med list, stroke after care reviewed with pt and 2 daughters who verbalized understanding.  IV and tele monitor removed.  Will pick up TOC meds at the pharmacy.  Pt discharged via wheelchair without incident

## 2024-02-01 NOTE — Progress Notes (Signed)
STROKE TEAM PROGRESS NOTE   INTERIM HISTORY/SUBJECTIVE She had has remained hemodynamically stable and afebrile overnight.  She is hard of hearing but is able to communicate easily with her family at the bedside. Patient and daughter feel her speech and facial droop are improving   Long discussion with the patient and her 2 daughters at the bedside along with Dr. Randye Lobo about risk benefits of anticoagulation resumption in patients with previous history of GI bleed.  In 2017 she had lower GI bleed due to angiodysplasia and Eliquis had been stopped.  Discussed with family lack of definitive data suggesting whether switching to Pradaxa or Xarelto is necessarily better with less bleeding risk has not been proven.  Antegrade dilation family agreed to going back on Eliquis and understands she is not a candidate for Watchman device due to advanced age and medical comorbidities. OBJECTIVE  CBC    Component Value Date/Time   WBC 4.7 01/31/2024 0743   RBC 3.76 (L) 01/31/2024 0743   HGB 10.6 (L) 01/31/2024 0743   HGB 10.6 (L) 11/28/2023 1623   HCT 32.5 (L) 01/31/2024 0743   HCT 32.7 (L) 11/28/2023 1623   PLT 237 01/31/2024 0743   PLT 215 11/28/2023 1623   MCV 86.4 01/31/2024 0743   MCV 91 11/28/2023 1623   MCH 28.2 01/31/2024 0743   MCHC 32.6 01/31/2024 0743   RDW 13.5 01/31/2024 0743   RDW 13.3 11/28/2023 1623   LYMPHSABS 1.0 01/30/2024 1109   LYMPHSABS 1.3 06/21/2022 1204   MONOABS 0.3 01/30/2024 1109   EOSABS 0.1 01/30/2024 1109   EOSABS 0.1 06/21/2022 1204   BASOSABS 0.0 01/30/2024 1109   BASOSABS 0.1 06/21/2022 1204    BMET    Component Value Date/Time   NA 140 02/01/2024 0602   NA 142 05/30/2023 1031   K 3.7 02/01/2024 0602   CL 105 02/01/2024 0602   CO2 24 02/01/2024 0602   GLUCOSE 95 02/01/2024 0602   BUN 14 02/01/2024 0602   BUN 34 (H) 05/30/2023 1031   CREATININE 1.11 (H) 02/01/2024 0602   CREATININE 0.78 04/16/2016 1649   CALCIUM 9.4 02/01/2024 0602   EGFR 44  (L) 05/30/2023 1031   GFRNONAA 48 (L) 02/01/2024 0602   GFRNONAA 72 04/16/2016 1649    IMAGING past 24 hours ECHOCARDIOGRAM COMPLETE Result Date: 01/31/2024    ECHOCARDIOGRAM REPORT   Patient Name:   Nicole Perez Date of Exam: 01/31/2024 Medical Rec #:  [id number]     Height:       59.0 in Accession #:    [id number]    Weight:       151.2 lb Date of Birth:  03-16-1933     BSA:          1.638 m Patient Age:    89 years      BP:           149/112 mmHg Patient Gender: F             HR:           86 bpm. Exam Location:  Inpatient Procedure: 2D Echo, Cardiac Doppler, Color Doppler and Saline Contrast Bubble            Study (Both Spectral and Color Flow Doppler were utilized during            procedure). Indications:    Stroke  History:        Patient has prior history of Echocardiogram examinations, most  recent 06/26/2022. CHF; Risk Factors:Hypertension.  Sonographer:    Darlys Gales Referring Phys: Elberta Fortis IMPRESSIONS  1. Left ventricular ejection fraction, by estimation, is 60 to 65%. The left ventricle has normal function. The left ventricle has no regional wall motion abnormalities. Left ventricular diastolic function could not be evaluated. Elevated left ventricular end-diastolic pressure.  2. Right ventricular systolic function is normal. The right ventricular size is normal. There is normal pulmonary artery systolic pressure.  3. Left atrial size was mildly dilated.  4. The mitral valve is normal in structure. Trivial mitral valve regurgitation. No evidence of mitral stenosis.  5. The aortic valve is normal in structure. Aortic valve regurgitation is trivial. No aortic stenosis is present.  6. The inferior vena cava is normal in size with greater than 50% respiratory variability, suggesting right atrial pressure of 3 mmHg. FINDINGS  Left Ventricle: Left ventricular ejection fraction, by estimation, is 60 to 65%. The left ventricle has normal function. The left ventricle has no  regional wall motion abnormalities. Strain imaging was not performed. The left ventricular internal cavity  size was normal in size. There is no left ventricular hypertrophy. Left ventricular diastolic function could not be evaluated due to atrial fibrillation. Left ventricular diastolic function could not be evaluated. Elevated left ventricular end-diastolic  pressure. Right Ventricle: The right ventricular size is normal. No increase in right ventricular wall thickness. Right ventricular systolic function is normal. There is normal pulmonary artery systolic pressure. The tricuspid regurgitant velocity is 2.48 m/s, and  with an assumed right atrial pressure of 3 mmHg, the estimated right ventricular systolic pressure is 27.6 mmHg. Left Atrium: Left atrial size was mildly dilated. Right Atrium: Right atrial size was normal in size. Pericardium: There is no evidence of pericardial effusion. Mitral Valve: The mitral valve is normal in structure. Trivial mitral valve regurgitation. No evidence of mitral valve stenosis. Tricuspid Valve: The tricuspid valve is normal in structure. Tricuspid valve regurgitation is trivial. No evidence of tricuspid stenosis. Aortic Valve: The aortic valve is normal in structure. Aortic valve regurgitation is trivial. No aortic stenosis is present. Aortic valve mean gradient measures 3.0 mmHg. Aortic valve peak gradient measures 4.6 mmHg. Aortic valve area, by VTI measures 1.65 cm. Pulmonic Valve: The pulmonic valve was normal in structure. Pulmonic valve regurgitation is not visualized. No evidence of pulmonic stenosis. Aorta: The aortic root is normal in size and structure. Venous: The inferior vena cava is normal in size with greater than 50% respiratory variability, suggesting right atrial pressure of 3 mmHg. IAS/Shunts: No atrial level shunt detected by color flow Doppler. Agitated saline contrast was given intravenously to evaluate for intracardiac shunting. Additional Comments: 3D  imaging was not performed.  LEFT VENTRICLE PLAX 2D LVIDd:         3.40 cm   Diastology LVIDs:         2.50 cm   LV e' medial:    6.96 cm/s LV PW:         0.90 cm   LV E/e' medial:  17.1 LV IVS:        1.00 cm   LV e' lateral:   8.81 cm/s LVOT diam:     1.70 cm   LV E/e' lateral: 13.5 LV SV:         32 LV SV Index:   19 LVOT Area:     2.27 cm  RIGHT VENTRICLE RV S prime:     13.40 cm/s TAPSE (M-mode): 1.4 cm LEFT ATRIUM  Index        RIGHT ATRIUM           Index LA Vol (A2C):   64.7 ml 39.50 ml/m  RA Area:     10.90 cm LA Vol (A4C):   32.1 ml 19.60 ml/m  RA Volume:   20.40 ml  12.46 ml/m LA Biplane Vol: 48.6 ml 29.67 ml/m  AORTIC VALVE AV Area (Vmax):    1.78 cm AV Area (Vmean):   1.63 cm AV Area (VTI):     1.65 cm AV Vmax:           107.00 cm/s AV Vmean:          83.500 cm/s AV VTI:            0.193 m AV Peak Grad:      4.6 mmHg AV Mean Grad:      3.0 mmHg LVOT Vmax:         83.80 cm/s LVOT Vmean:        60.000 cm/s LVOT VTI:          0.140 m LVOT/AV VTI ratio: 0.73  AORTA Ao Root diam: 2.90 cm MITRAL VALVE                TRICUSPID VALVE MV Area (PHT): 4.41 cm     TR Peak grad:   24.6 mmHg MV Decel Time: 172 msec     TR Vmax:        248.00 cm/s MV E velocity: 119.00 cm/s                             SHUNTS                             Systemic VTI:  0.14 m                             Systemic Diam: 1.70 cm Chilton Si MD Electronically signed by Chilton Si MD Signature Date/Time: 01/31/2024/6:22:37 PM    Final     Vitals:   01/31/24 2318 02/01/24 0319 02/01/24 0739 02/01/24 1105  BP: (!) 158/91 118/84 (!) 111/98 (!) 136/101  Pulse: 91 95 69 (!) 102  Resp: 18 18 17 17   Temp: (!) 97.5 F (36.4 C) 98.1 F (36.7 C) 98 F (36.7 C) 98.3 F (36.8 C)  TempSrc: Oral Oral Oral Oral  SpO2: 99% 97% 98% 99%  Weight:         PHYSICAL EXAM General:  Alert, well-nourished, well-developed pleasant elderly African-American lady in no acute distress Psych:  Mood and affect appropriate  for situation CV: Regular rate and rhythm on monitor Respiratory:  Regular, unlabored respirations on room air GI: Abdomen soft and nontender   NEURO:  Mental Status: Responds to name but is unable to state place time and situation Speech/Language: speech is with moderate dysarthria, difficult to communicate with patient as she is hard of hearing  Cranial Nerves:  II: PERRL.  III, IV, VI: EOMI. Eyelids elevate symmetrically.  VII: Mild left facial droop VIII: hearing intact to voice. IX, X: Voice is dysarthric XII: tongue is midline without fasciculations. Motor: Able to move all 4 extremities with antigravity strength, but some weakness of left arm and orbiting of right arm around left arm Tone: is normal and bulk is normal Sensation- Intact to light  touch bilaterally.  Coordination: FTN intact bilaterally Gait- deferred  Most Recent NIH  1a Level of Conscious.: 0 1b LOC Questions: 2 1c LOC Commands: 0 2 Best Gaze: 0 3 Visual: 0 4 Facial Palsy: 1 5a Motor Arm - left: 0 5b Motor Arm - Right: 0 6a Motor Leg - Left: 1 6b Motor Leg - Right: 1 7 Limb Ataxia: 0 8 Sensory: 0 9 Best Language: 0 10 Dysarthria: 1 11 Extinct. and Inatten.: 0 TOTAL: 6   ASSESSMENT/PLAN  [name] is a 89 y.o. female with history of hypertension, hyperlipidemia and A-fib not on anticoagulation due to history of GI bleed admitted for acute onset left facial droop and slurred speech with right gaze preference.  MRI reveals right MCA territory stroke.  Discussed anticoagulation with patient's family, but they have opted not to initiate anticoagulation due to history of GI bleed on Eliquis.  NIH on Admission 7  Acute Ischemic Infarct:  right MCA territory stroke Etiology: Likely cardioembolic in the setting of A-fib without anticoagulation Code Stroke CT head chronic right PCA infarct, additional small infarcts in right caudate nucleus and right thalamus, age-indeterminate Small vessel  disease. Atrophy. ASPECTS 9 CTA head & neck no LVO, severe stenosis of right PCA P2 stenotic bed, moderate stenosis of cavernous right ICA, moderate to severe stenosis of cavernous left ICA, moderate stenosis of right MCA M1 segment, severe stenosis of left MCA M1 segment MRI acute cortical and subcortical right MCA territory infarct within mid right frontal lobe 2D Echo ejection fraction 60 to 65%.  Left atrial dilatation.   LDL 78 HgbA1c 5.4 VTE prophylaxis -Lovenox No antithrombotic prior to admission, now on aspirin 81 mg daily and clopidogrel 75 mg daily for months and then aspirin alone. Therapy recommendations:  Pending Disposition: Pending  Hx of Stroke/TIA Patient has several old infarcts seen on CT  Atrial fibrillation Home Meds: Metoprolol 25 mg daily Continue telemetry monitoring Discussed anticoagulation with patient's family, and due to risk of GI bleed, decision was made to proceed with DAPT  Hypertension Home meds: None Stable Blood Pressure Goal: BP less than 220/110   Hyperlipidemia Home meds: None LDL 78, goal < 70 Add atorvastatin 40 mg daily Continue statin at discharge  Dysphagia Patient has post-stroke dysphagia, SLP consulted    Diet   DIET DYS 3 Fluid consistency: Thin   Advance diet as tolerated  Other Stroke Risk Factors Obesity, Body mass index is 30.55 kg/m., BMI >/= 30 associated with increased stroke risk, recommend weight loss, diet and exercise as appropriate    Other Active Problems None  Hospital day # 2  Patient with A-fib not on anticoagulation due to remote history of GI bleed on Eliquis presents with slurred speech and left facial droop due to right MCA frontal branch infarct likely of cardioembolic etiology even though she does have multivessel moderate to severe stenosis and anterior and posterior circulation.  Long discussion with the patient, daughters x 2 and Dr Linwood Dibbles about risk-benefit of anticoagulation with Eliquis or  alternative medications and family understands as risk of recurrent more disabling stroke is higher than presumed risk of recurrent bleeding and would like to go back on Eliquis.  Patient will need close monitoring of her hematocrit and bleeding as outpatient.  Patient is not a candidate for Watchman device due to advanced age and medical comorbidities at this time.  Discontinue aspirin and Plavix..  Aggressive risk factor modification.  Mobilize out of bed.  Therapy consults.  Greater than 50% time during this 35-minute visit was spent in counseling and coordination of care about embolic stroke and A-fib and discussion about risk-benefit of anticoagulation in the setting of GI hemorrhage history and answering questions Stroke team will sign off.  Follow-up as an outpatient stroke clinic in 2 months Delia Heady, MD Medical Director [facility] Pager: [phone] 02/01/2024 12:06 PM  To contact Stroke Continuity provider, please refer to [email or web address]. After hours, contact General Neurology

## 2024-02-01 NOTE — Discharge Summary (Cosign Needed Addendum)
Family Medicine Teaching [facility] Discharge Summary  Patient name: Nicole Perez Medical record number: [id number] Date of birth: 1933/09/16 Age: 89 y.o. Gender: female Date of Admission: 01/30/2024  Date of Discharge: 02/01/24  Admitting Physician: Caro Laroche, DO  Primary Care Provider: McDiarmid, Leighton Roach, MD Consultants: Neurology  Indication for Hospitalization: CVA  Discharge Diagnoses/Problem List:  Principal Problem for Admission: CVA Other Problems addressed during stay:  Principal Problem:   CVA (cerebral vascular accident) [facility]) Active Problems:   Atrial fibrillation [facility])   Intermittent self-catheterization of bladder  Brief Hospital Course:  Nicole Perez is a 89 y.o. female who was admitted to the [facility] Medicine Teaching Service at [facility] for CVA. Hospital course is outlined below by problem.   CVA  Patient presented with dysarthria and L sided facial droop. Imaging revealed an acute infarct involving the R MCA territory within the R frontal lobe/operculum along with moderate-severe stenoses involving several areas.  Unfortunately due to her history of significant GI bleeds, Eliquis was previously discontinued and family refused TNK upon arrival.  Her neuro exam was notable for slight left-sided facial droop and mild LUE weakness.  Echo revealed EF 60-65%, no RWMA, LA mildly dilated, AVR trivial, > 50% IVC respiratory variability.  Upon multiple discussions regarding risks vs benefits of DAPT vs restarting Eliquis vs not treating due to risk of GI bleeding.  Family decided that they would like to restart Eliquis 5 mg BID to prevent strokes in the future and hold off on DAPT.  Lipitor 40 mg daily was also initiated prior to discharge.  Atrial Fibrillation Was previously off of Eliquis due to having GI bleeds in the past.  Restarted Eliquis upon discharge.  Recommend discussing risk versus benefits further with family if desired.  Could also consider lowering  Eliquis to 2.5 mg BID d/t risk.  Intermittent Self Catheterization of Bladder History of in and out caths at home for which her daughter assists in the setting of neurogenic bladder.  Care continued during hospitalization with bladder scans.  Hypertension Lowered her Losartan-HCTZ dose to 25-12.5 mg daily due to lower blood pressures in the hospital.  Recommend titration as needed outpatient.  Other conditions that were chronic and stable were managed with home medications or formulary alternatives (CHF, CKD3, CAD/PAD, COPD/emphysema,  Issues for follow up: Discussions about Eliquis with family.  Consider lowering dose to 2.5 mg due to history and increased risk of GI bleeds. Titrate BP medications as needed. Recheck CBC and BMP outpatient. Reassess neurological deficits.  Disposition: Home  Discharge Condition: Stable  Discharge Exam:  Vitals:   02/01/24 0739 02/01/24 1105  BP: (!) 111/98 (!) 136/101  Pulse: 69 (!) 102  Resp: 17 17  Temp: 98 F (36.7 C) 98.3 F (36.8 C)  SpO2: 98% 99%   General: Elderly appearing female in NAD Cardiovascular: Irregularly irregular rhythm.  No appreciable M/R/G. Respiratory: CTAB.  Normal WOB on RA. Abdomen: Soft, nontender, nondistended.  Normoactive bowel sounds. Extremities: No BLE edema. Neuro: Alert and oriented.  Mild L sided facial droop and mild LUE weakness, chronic baseline hearing loss, otherwise no deficits  Significant Procedures: None  Significant Labs and Imaging:  Recent Labs  Lab 01/31/24 0743  WBC 4.7  HGB 10.6*  HCT 32.5*  PLT 237   Recent Labs  Lab 01/31/24 0743 02/01/24 0602  NA 137 140  K 3.3* 3.7  CL 102 105  CO2 22 24  GLUCOSE 88 95  BUN 16 14  CREATININE 1.12* 1.11*  CALCIUM 9.0 9.4  MG  --  1.8    CT Head w/o contrast 1. 3 cm cortical/subcortical right PCA territory infarct within the right temporo-occipital lobes. This is new from the prior brain MRI of 08/12/2014 and appears chronic.  However, consider a brain MRI to exclude acute on chronic ischemia at this site. 2. Additional small infarcts within the right caudate nucleus and right thalamus which are new from the prior brain MRI, but otherwise age-indeterminate. Consider a brain MRI for further evaluation. 3. Background parenchymal atrophy and chronic small vessel ischemic disease.  CTA Head & Neck CTA neck: 1. The common carotid and internal carotid arteries are patent within the neck without hemodynamically significant stenosis. Mild atherosclerotic plaque bilaterally, as described. 2. Vertebral arteries patent within the neck without stenosis or significant atherosclerotic disease. 3. Aortic Atherosclerosis (ICD10-I70.0) and Emphysema (ICD10-J43.9).   CTA head:  1. Intracranial atherosclerotic disease with multifocal stenoses, most notably as follows. 2. Severe near-occlusive stenosis of the right posterior cerebral artery P2 segment. 3. Up to moderate stenosis of the cavernous right internal carotid artery. 4. Moderate/severe stenosis of the cavernous left internal carotid artery. 5. Moderate stenosis of the right middle cerebral artery proximal M1 segment. 6. Severe stenosis of the left middle cerebral artery proximal M1 segment.  MR Brain w/o contrast 1. 3.9 x 4.2 cm acute cortical/subcortical right middle cerebral artery territory infarct within the mid right frontal lobe/frontal operculum. 2. Background parenchymal atrophy, chronic small vessel ischemic disease and chronic infarcts, as described. 3. Postoperative changes and multiple cystic/nodular lesions again noted within the right parotid space, not significantly changed from the prior brain MRI of 08/12/2014. Correlate with the surgical history. 4. Minor right ethmoid sinusitis  Results/Tests Pending at Time of Discharge: None  Discharge Medications:  Allergies as of 02/01/2024       Reactions   Macrobid [nitrofurantoin] Other (See  Comments)   Malaise and profound fatigue   Cipro [ciprofloxacin Hcl] Other (See Comments)   Fluoroquinolones associated with increase risk of aortic aneurysm rupture   Lyrica [pregabalin] Other (See Comments)   Dizziness    Neurontin [gabapentin] Other (See Comments)   Dizziness    Keflex [cephalexin] Nausea Only        Medication List     STOP taking these medications    doxycycline 100 MG tablet Commonly known as: VIBRA-TABS   losartan-hydrochlorothiazide 100-12.5 MG tablet Commonly known as: HYZAAR Replaced by: losartan-hydrochlorothiazide 50-12.5 MG tablet       TAKE these medications    acetaminophen 500 MG tablet Commonly known as: TYLENOL Take 500 mg by mouth 2 (two) times daily as needed for headache or moderate pain (pain score 4-6).   albuterol 108 (90 Base) MCG/ACT inhaler Commonly known as: VENTOLIN HFA INHALE 2 PUFFS INTO THE LUNGS EVERY 6 HOURS AS NEEDED FOR WHEEZING OR SHORTNESS OF BREATH   apixaban 5 MG Tabs tablet Commonly known as: ELIQUIS Take 1 tablet (5 mg total) by mouth 2 (two) times daily.   atorvastatin 40 MG tablet Commonly known as: LIPITOR Take 1 tablet (40 mg total) by mouth daily. Start taking on: February 02, 2024   bisacodyl 5 MG EC tablet Generic drug: bisacodyl TAKE 2 TABLETS(10 MG) BY MOUTH DAILY What changed: See the new instructions.   furosemide 20 MG tablet Commonly known as: Lasix Take 1 tablet (20 mg total) by mouth daily as needed. Take if your weight increases by 3 lbs in 24 hours or 5  lbs in one week. Or if you develop worsening leg swelling. Call clinic if you need to use.   HYDROcodone-acetaminophen 10-325 MG tablet Commonly known as: NORCO Take 1 tablet by mouth every 8 (eight) hours as needed. What changed:  when to take this reasons to take this   losartan-hydrochlorothiazide 50-12.5 MG tablet Commonly known as: Hyzaar Take 1 tablet by mouth daily. Replaces: losartan-hydrochlorothiazide 100-12.5 MG  tablet   metoprolol succinate 50 MG 24 hr tablet Commonly known as: TOPROL-XL Take 25 mg by mouth daily. Take with or immediately following a meal.   pantoprazole 40 MG tablet Commonly known as: PROTONIX Take 1 tablet (40 mg total) by mouth daily at 6 (six) AM. What changed:  when to take this reasons to take this        Discharge Instructions: Please refer to Patient Instructions section of EMR for full details.  Patient was counseled important signs and symptoms that should prompt return to medical care, changes in medications, dietary instructions, activity restrictions, and follow up appointments.   Follow-Up Appointments:  Follow-up Information     Fortunato Curling, DO Follow up on 02/06/2024.   Specialty: Family Medicine Why: at 2:50PM Contact information: [address] [place] [address] [phone]                 Fortunato Curling, DO 02/01/2024, 12:41 PM PGY-1, [facility] Family Medicine

## 2024-02-01 NOTE — Plan of Care (Signed)
  Problem: Clinical Measurements: Goal: Ability to maintain clinical measurements within normal limits will improve Outcome: Progressing Goal: Will remain free from infection Outcome: Progressing Goal: Diagnostic test results will improve Outcome: Progressing Goal: Respiratory complications will improve Outcome: Progressing Goal: Cardiovascular complication will be avoided Outcome: Progressing   Problem: Activity: Goal: Risk for activity intolerance will decrease Outcome: Progressing   Problem: Safety: Goal: Ability to remain free from injury will improve Outcome: Progressing   Problem: Elimination: Goal: Will not experience complications related to bowel motility Outcome: Progressing Goal: Will not experience complications related to urinary retention Outcome: Progressing   Problem: Pain Managment: Goal: General experience of comfort will improve and/or be controlled Outcome: Progressing   Problem: Skin Integrity: Goal: Risk for impaired skin integrity will decrease Outcome: Progressing

## 2024-02-01 NOTE — Discharge Instructions (Addendum)
Dear Nicole Perez,  Thank you for letting us participate in your care. You were hospitalized for L sided facial droop and impaired speech and was diagnosed with CVA (cerebral vascular accident) (HCC). After discussing the risks and benefits with neurology we will discharge you on Eliquis, without another antiplatelet therapy with your stroke, history of A-fib, and GI bleeds. Please continue taking this medication, there is an increased risk of bleeding with this medication. You can continue to have discussions with Dr. McDiarmid about these medications as well.   Your blood pressure medication dosing was lowered slightly due to having lower blood pressures in the hospital. This can be titrated as needed once you follow up with if BP is elevated.  Your stroke symptoms may take some time to resolve or remain, this is something we will have to continue to watch and follow up.  POST-HOSPITAL & CARE INSTRUCTIONS Please take your medications as prescribed.  Please contact your doctor or come to the ED if you have signs of excessive bleeding with your history of GI bleeds on blood thinners.  Go to your follow up appointments (listed below)   DOCTOR'S APPOINTMENT   Future Appointments  Date Time Provider Department Center  07/06/2024  3:40 PM [facility] ANNUAL WELLNESS VISIT [facility]     Take care and be well!  Family Medicine Teaching Service Inpatient Team [place]  [facility]  [address] [place], [place] [address] [phone]  Information on my medicine - ELIQUIS (apixaban)   Why was Eliquis prescribed for you? Eliquis was prescribed for you to reduce the risk of a blood clot forming that can cause a stroke if you have a medical condition called atrial fibrillation (a type of irregular heartbeat).  What do You need to know about Eliquis ? Take your Eliquis TWICE DAILY - one tablet in the morning and one tablet in the evening with or without food. If  you have difficulty swallowing the tablet whole please discuss with your pharmacist how to take the medication safely.  Take Eliquis exactly as prescribed by your doctor and DO NOT stop taking Eliquis without talking to the doctor who prescribed the medication.  Stopping may increase your risk of developing a stroke.  Refill your prescription before you run out.  After discharge, you should have regular check-up appointments with your healthcare provider that is prescribing your Eliquis.  In the future your dose may need to be changed if your kidney function or weight changes by a significant amount or as you get older.  What do you do if you miss a dose? If you miss a dose, take it as soon as you remember on the same day and resume taking twice daily.  Do not take more than one dose of ELIQUIS at the same time to make up a missed dose.  Important Safety Information A possible side effect of Eliquis is bleeding. You should call your healthcare provider right away if you experience any of the following: Bleeding from an injury or your nose that does not stop. Unusual colored urine (red or dark brown) or unusual colored stools (red or black). Unusual bruising for unknown reasons. A serious fall or if you hit your head (even if there is no bleeding).  Some medicines may interact with Eliquis and might increase your risk of bleeding or clotting while on Eliquis. To help avoid this, consult your healthcare provider or pharmacist prior to using any new prescription or non-prescription  medications, including herbals, vitamins, non-steroidal anti-inflammatory drugs (NSAIDs) and supplements.  This website has more information on Eliquis (apixaban): http://www.eliquis.com/eliquis/home

## 2024-02-01 NOTE — Assessment & Plan Note (Deleted)
 Not on anticoagulation secondary to having GI bleeds in the past.  Neurology recommended starting ASA 81 mg and Lovenox.  Family is amenable this AM. - ASA 81 mg daily - Palliative consult placed to discuss GOC with family regarding medication management

## 2024-02-01 NOTE — Assessment & Plan Note (Deleted)
Pt initially presented with dysarthria and left-sided facial droop.  Head imaging revealed acute infarct involving the right MCA territory within the right frontal lobe/operculum.  Additional findings of moderate-severe stenosis involving several areas throughout the rPCA, r/l ICAs, r/l MCAs, as well as chronic small vessel disease.  Unfortunately, given her history of significant GI bleed, she is unlikely to be a candidate for long-term AC therapy and family refused TNK due to bleeding risk.  Favor embolism vs small-vessel disease given underlying history, especially since she is unable to be on chronic AC for her A-fib.  Somnolence s/p ativan and dilaudid has improved and patient is back to baseline mentation at this time.  She is hard of hearing, but otherwise neuro exam was notable for slight L-sided facial droop and mild LUE weakness.  Passed swallow screen, SLP recommended Dysphagia 3 diet.  LDL slightly elevated to 78 with hx of T2DM, A1c stable at 5.4%.  - Cardiac monitoring - Neurology consulted, appreciate recs - Neurology recommended starting ASA 81 mg daily - Continue Lipitor 40 mg daily - Neuroprotection measures: euglycemia, normothermia, normoxia, euvolemia - PT/OT/SLP consulted, appreciate recommendations

## 2024-02-03 ENCOUNTER — Telehealth: Payer: Self-pay

## 2024-02-03 NOTE — Transitions of Care (Post Inpatient/ED Visit) (Signed)
02/03/2024  Name: Nicole Perez MRN: [id number] DOB: Jan 09, 1933  Today's TOC FU Call Status: Today's TOC FU Call Status:: Successful TOC FU Call Completed TOC FU Call Complete Date: 02/03/24 Patient's Name and Date of Birth confirmed.  Transition Care Management Follow-up Telephone Call Date of Discharge: 02/01/24 Discharge Facility: [facility]) Type of Discharge: Inpatient Admission Primary Inpatient Discharge Diagnosis:: CVA (cerebral vascular accident) How have you been since you were released from the hospital?: Better Any questions or concerns?: No  Items Reviewed: Did you receive and understand the discharge instructions provided?: Yes Medications obtained,verified, and reconciled?: Yes (Medications Reviewed) Any new allergies since your discharge?: No Dietary orders reviewed?: Yes Type of Diet Ordered:: low sodium heart healthy - cut in small pieces Do you have support at home?: Yes People in Home: child(ren), adult Name of Support/Comfort Primary Source: One daugther lives her, son sits with her in the morning, another daugther takes her to appts.  Medications Reviewed Today: Medications Reviewed Today     Reviewed by Jessy Oto, RN (Registered Nurse) on 02/03/24 at 0919  Med List Status: <None>   Medication Order Taking? Sig Documenting Provider Last Dose Status Informant  acetaminophen (TYLENOL) 500 MG tablet [id number] Yes Take 500 mg by mouth 2 (two) times daily as needed for headache or moderate pain (pain score 4-6). [provider] Taking Active Child, Pharmacy Records  albuterol (VENTOLIN HFA) 108 (90 Base) MCG/ACT inhaler [id number] Yes INHALE 2 PUFFS INTO THE LUNGS EVERY 6 HOURS AS NEEDED FOR WHEEZING OR SHORTNESS OF BREATH Chambliss, Estill Batten, MD Taking Active Child, Pharmacy Records  apixaban (ELIQUIS) 5 MG TABS tablet [id number] Yes Take 1 tablet (5 mg total) by mouth 2 (two) times daily. Elberta Fortis, MD Taking Active   atorvastatin  (LIPITOR) 40 MG tablet [id number] Yes Take 1 tablet (40 mg total) by mouth daily. Elberta Fortis, MD Taking Active   BISACODYL 5 MG EC tablet [id number] Yes TAKE 2 TABLETS(10 MG) BY MOUTH DAILY  Patient taking differently: Take 10 mg by mouth daily as needed for severe constipation.   McDiarmid, Leighton Roach, MD Taking Active Child, Pharmacy Records  furosemide (LASIX) 20 MG tablet [id number] Yes Take 1 tablet (20 mg total) by mouth daily as needed. Take if your weight increases by 3 lbs in 24 hours or 5 lbs in one week. Or if you develop worsening leg swelling. Call clinic if you need to use. McDiarmid, Leighton Roach, MD Taking Active Child, Pharmacy Records  HYDROcodone-acetaminophen [facility]) 10-325 MG tablet [id number] Yes Take 1 tablet by mouth every 8 (eight) hours as needed. Elberta Fortis, MD Taking Active   losartan-hydrochlorothiazide [facility]) 50-12.5 MG tablet [id number] Yes Take 1 tablet by mouth daily. Elberta Fortis, MD Taking Active   metoprolol succinate (TOPROL-XL) 50 MG 24 hr tablet [id number] Yes Take 25 mg by mouth daily. Take with or immediately following a meal. McDiarmid, Leighton Roach, MD Taking Active Child, Pharmacy Records           Med Note (COFFELL, Marzella Schlein   Thu Jan 30, 2024  3:11 PM) Family at beside state pt is still taking this medication, daily. Per dispense report, LF 06/29/2023 #85, 85 DS.  pantoprazole (PROTONIX) 40 MG tablet [id number] Yes Take 1 tablet (40 mg total) by mouth daily at 6 (six) AM.  Patient taking differently: Take 40 mg by mouth daily as needed (acid reflux, heartburn).   M[place] Manners, MD Taking Active Child, Pharmacy Records  Med Note (COFFELL, Marzella Schlein   Thu Jan 30, 2024  3:07 PM) Has available at home, rarely used. Unknown last dose.            Home Care and Equipment/Supplies: Were Home Health Services Ordered?: No Any new equipment or medical supplies ordered?: No (Patient has walker and transport chair not new.)  Functional  Questionnaire: Do you need assistance with bathing/showering or dressing?: Yes (family assists - only showers with family present - has walk in shower) Do you need assistance with meal preparation?: Yes (family prepares meals) Do you need assistance with eating?: No Do you have difficulty maintaining continence: No Do you need assistance with getting out of bed/getting out of a chair/moving?: Yes (since hospitalization, family is staying by her side when she gets up) Do you have difficulty managing or taking your medications?: Yes (family assisting with medications currently - patient is usually independent)  Follow up appointments reviewed: PCP Follow-up appointment confirmed?: Yes Date of PCP follow-up appointment?: 02/06/24 Follow-up Provider: Fortunato Curling, DO Specialist Hospital Follow-up appointment confirmed?: NA Do you need transportation to your follow-up appointment?: No (daughter takes patient to all MD appts) Do you understand care options if your condition(s) worsen?: Yes-patient verbalized understanding  SDOH Interventions Today    Flowsheet Row Most Recent Value  SDOH Interventions   Food Insecurity Interventions Intervention Not Indicated  Housing Interventions Intervention Not Indicated  Transportation Interventions Intervention Not Indicated  Utilities Interventions Intervention Not Indicated       Goals Addressed               This Visit's Progress     Patient will report no readmissions in the next 30 days (pt-stated)        Current Barriers:  Patient and Family Knowledge Deficit related to signs/symptoms of CVA. Son was with patient and didn't recognize stroke symptoms Knowledge Deficit related to importance of Eliquis and monitoring for GI bleed related to patient history. Daughter reports patient is currently taking Eliquis. Knowledge deficit related to infection prevention related to self cath due to neurogenic bladder. Recently treated with Doxycycline  and history of UTI  RNCM Clinical Goal(s):  Patient will work with the Care Management team over the next 30 days to address Transition of Care Barriers: Medication Management verbalize basic understanding of  signs and symptoms of CVA disease process and self health management plan as evidenced by patient/family report take all medications exactly as prescribed and will call provider for medication related questions as evidenced by patient/family report and EMR attend all scheduled medical appointments: with PCP and any future specialist as evidenced by patient report and EMR  through collaboration with RN Care manager, provider, and care team.  Patient/Family will use proper technique for in and out urinary cath to avoid UTI   Interventions: Evaluation of current treatment plan related to  self management and patient's adherence to plan as established by provider  Transitions of Care:  New goal. Durable Medical Equipment (DME) needs assessed with patient/caregiver. Reviewed with daughter who asked about bedside commode and states she plans to discuss with MD at appointment.  Doctor Visits  - discussed the importance of doctor visits. Daughter may need to cancel current PCP appt. Educated to attempt to get appointment within 7-10 days.  Reviewed calling RN or MD with any new issues concerns  Educated daughter on TOC 30 day program and daughter agreed to participate  Stroke:  (Status:New goal.) Short Term Goal Reviewed Importance of taking  all medications as prescribed Reviewed Importance of attending all scheduled provider appointments Assessed for signs and symptoms of stroke. Reviewed with daughter stroke s/s and when to call 911 and ensure her siblings are aware of s/s.  Assessed for fall status and safety in the home Education provided on management of blood pressure and checking daily with recent decrease of BP med  Education provided to seek help immediately with signs/symptoms of  stroke Education provided regarding signs /symptoms of GI bleed related to Eliquis and patient history  Educated daughter on s/s UTI and importance of cleaning front to back prior to In and Out cath and UTI prevention  Patient Goals/Self-Care Activities: Participate in Transition of Care Program/Attend [facility] scheduled calls Notify RN Care Manager of TOC call rescheduling needs Take all medications as prescribed Attend all scheduled provider appointments Call provider office for new concerns or questions  Use clean technique when performing In and Out catheter to help prevent UTI Seek medical treatment for any fall resulting in hitting head or bleeding related to Eliquis Check BP daily and notify RN or MD with elevated BP related to Lorsartan dose being decreased Discuss with PCP if MD feels patient would benefit from bedside commode. Discuss activity recommendations with PCP Patient will use walker at all times when ambulating.  Family will walk with patient    Follow Up Plan:  Telephone follow up appointment with care management team member scheduled for:  02/11/24 9am The patient has been provided with contact information for the care management team and has been advised to call with any health related questions or concerns.          CM spoke with daughter, Darden Palmer, who states patient is doing better. States patient has support for another sister and a brother and someone is with patient at all times. Reinforced education on stroke signs and symptoms and getting help immediately. Reinforced education on signs and symptoms of GI bleed with Eliquis.  Provided education on cleaning front to back with catheter in and out and to always clean prior to inserting cath related to patient history of UTI. Provided education on checking BP related Losartan dose decreased. Daughter, Westley Hummer takes patient to MD appts and states she may change date of scheduled appt. Educated to attempt to  schedule within 7-10 days after discharge.   Hilbert Odor RN, CCM [employer]  VBCI-Population Health RN Care Manager [phone]

## 2024-02-03 NOTE — Patient Outreach (Deleted)
{  VBCI TOC PROGRAM NOTES:30402}

## 2024-02-06 ENCOUNTER — Ambulatory Visit: Payer: Self-pay | Admitting: Family Medicine

## 2024-02-07 ENCOUNTER — Encounter: Payer: Self-pay | Admitting: Student

## 2024-02-07 ENCOUNTER — Ambulatory Visit (INDEPENDENT_AMBULATORY_CARE_PROVIDER_SITE_OTHER): Payer: Medicare Other | Admitting: Student

## 2024-02-07 VITALS — BP 139/83 | HR 109 | Ht 59.0 in

## 2024-02-07 DIAGNOSIS — I4891 Unspecified atrial fibrillation: Secondary | ICD-10-CM

## 2024-02-07 DIAGNOSIS — Z09 Encounter for follow-up examination after completed treatment for conditions other than malignant neoplasm: Secondary | ICD-10-CM | POA: Diagnosis not present

## 2024-02-07 MED ORDER — METOPROLOL SUCCINATE ER 50 MG PO TB24: 25.0000 mg | ORAL_TABLET | Freq: Every day | ORAL | 3 refills | Status: AC

## 2024-02-07 MED ORDER — DOXEPIN HCL 10 MG PO CAPS: 10.0000 mg | ORAL_CAPSULE | Freq: Every day | ORAL | 0 refills | Status: DC

## 2024-02-07 MED ORDER — APIXABAN 2.5 MG PO TABS: 2.5000 mg | ORAL_TABLET | Freq: Two times a day (BID) | ORAL | 1 refills | Status: DC

## 2024-02-07 NOTE — Assessment & Plan Note (Addendum)
Patient restarted on Eliquis, during hospitalization.  Patient unhappy about being on blood thinner, given history of GI bleeds, but does want protection from potential strokes.  Through shared decision making offered reduced dose Eliquis. - Eliquis 2.5 mg twice daily

## 2024-02-07 NOTE — Assessment & Plan Note (Addendum)
Patient comes in for hospital follow-up.  Patient reports she is doing well, no complaints.  Patient was unhappy to be taking Eliquis, given her history of GI bleed, however did want something to help protect her from another stroke.  Through shared decision making she elected for the reduced dose Eliquis to help reduce the risk of stroke, but also reduce risk of bleeding.  Patient with normal neuroexam, could not appreciate any deficits. Patient also notes that she is not sleeping well, has problems falling asleep, or will wake up in the middle of the night.  Will trial sleep medicine. - Doxepin 10 mg nightly - Eliquis 2.5 mg twice daily - Follow-up as needed

## 2024-02-07 NOTE — Progress Notes (Signed)
  SUBJECTIVE:   CHIEF COMPLAINT / HPI:   Hospital F/u -2/13 for CVA > No Tx > Left sided weakness and facial droop, restarted eliquis -Consider reduced dose eliquis given hx of GI bleed  Today: Doing well, just not sleeping well, often waking up late in the night. And also has problems falling asleep.   PERTINENT  PMH / PSH:   OBJECTIVE:  LMP 12/17/1960  Physical Exam Constitutional:      General: She is not in acute distress.    Appearance: She is not ill-appearing.  Neurological:     Mental Status: She is alert.     Cranial Nerves: Cranial nerves 2-12 are intact. No cranial nerve deficit, dysarthria or facial asymmetry.     Sensory: Sensation is intact. No sensory deficit.     Motor: Motor function is intact. No weakness or tremor.      ASSESSMENT/PLAN:   Assessment & Plan Hospital discharge follow-up Patient comes in for hospital follow-up.  Patient reports she is doing well, no complaints.  Patient was unhappy to be taking Eliquis, given her history of GI bleed, however did want something to help protect her from another stroke.  Through shared decision making she elected for the reduced dose Eliquis to help reduce the risk of stroke, but also reduce risk of bleeding.  Patient with normal neuroexam, could not appreciate any deficits. Patient also notes that she is not sleeping well, has problems falling asleep, or will wake up in the middle of the night.  Will trial sleep medicine. - Doxepin 10 mg nightly - Eliquis 2.5 mg twice daily - Follow-up as needed Atrial fibrillation, unspecified type [facility]) Patient restarted on Eliquis, during hospitalization.  Patient unhappy about being on blood thinner, given history of GI bleeds, but does want protection from potential strokes.  Through shared decision making offered reduced dose Eliquis. - Eliquis 2.5 mg twice daily No follow-ups on file. Bess Kinds, MD 02/07/2024, 7:23 AM PGY-3, [facility]

## 2024-02-07 NOTE — Patient Instructions (Addendum)
It was great to see you! Thank you for allowing me to participate in your care!  I recommend that you always bring your medications to each appointment as this makes it easy to ensure we are on the correct medications and helps [place] not miss when refills are needed.  Our plans for today:  [facility] Follow Up Visit Today we saw you to follow up your hospitalization for a stroke. You are doing GREAT!   Start Eliquis 2.5 mg twice a day  Checking some blood work   -Sleep  Take doxepin 10 mg before bed, to help you sleep  We are checking some labs today, I will call you if they are abnormal will send you a MyChart message or a letter if they are normal.  If you do not hear about your labs in the next 2 weeks please let us know.  Take care and seek immediate care sooner if you develop any concerns.   Dr. Bess Kinds, MD [facility]

## 2024-02-08 DIAGNOSIS — I69398 Other sequelae of cerebral infarction: Secondary | ICD-10-CM | POA: Diagnosis not present

## 2024-02-08 DIAGNOSIS — I69322 Dysarthria following cerebral infarction: Secondary | ICD-10-CM | POA: Diagnosis not present

## 2024-02-08 DIAGNOSIS — I69328 Other speech and language deficits following cerebral infarction: Secondary | ICD-10-CM | POA: Diagnosis not present

## 2024-02-08 DIAGNOSIS — R29898 Other symptoms and signs involving the musculoskeletal system: Secondary | ICD-10-CM | POA: Diagnosis not present

## 2024-02-08 DIAGNOSIS — I69392 Facial weakness following cerebral infarction: Secondary | ICD-10-CM | POA: Diagnosis not present

## 2024-02-08 LAB — BASIC METABOLIC PANEL
BUN/Creatinine Ratio: 22 (ref 12–28)
BUN: 30 mg/dL — ABNORMAL HIGH (ref 8–27)
CO2: 22 mmol/L (ref 20–29)
Calcium: 9.1 mg/dL (ref 8.7–10.3)
Chloride: 103 mmol/L (ref 96–106)
Creatinine, Ser: 1.37 mg/dL — ABNORMAL HIGH (ref 0.57–1.00)
Glucose: 129 mg/dL — ABNORMAL HIGH (ref 70–99)
Potassium: 3.7 mmol/L (ref 3.5–5.2)
Sodium: 141 mmol/L (ref 134–144)
eGFR: 37 mL/min/{1.73_m2} — ABNORMAL LOW (ref >59)

## 2024-02-08 LAB — CBC
Hematocrit: 37.7 % (ref 34.0–46.6)
Hemoglobin: 12.3 g/dL (ref 11.1–15.9)
MCH: 28.4 pg (ref 26.6–33.0)
MCHC: 32.6 g/dL (ref 31.5–35.7)
MCV: 87 fL (ref 79–97)
Platelets: 312 10*3/uL (ref 150–450)
RBC: 4.33 x10E6/uL (ref 3.77–5.28)
RDW: 13.3 % (ref 11.7–15.4)
WBC: 4.8 10*3/uL (ref 3.4–10.8)

## 2024-02-11 ENCOUNTER — Other Ambulatory Visit: Payer: Self-pay

## 2024-02-11 ENCOUNTER — Telehealth: Payer: Self-pay

## 2024-02-11 NOTE — Patient Outreach (Signed)
 Care Management  Transitions of Care Program Transitions of Care Post-discharge week 2  02/11/2024 Name: [name] MRN: [id number] DOB: [birth year]  Subjective: Nicole Perez is a 89 y.o. year old female who is a primary care patient of McDiarmid, Leighton Roach, MD. The Care Management team was unable to reach the patient by phone to assess and address transitions of care needs.   Plan: Additional outreach attempts will be made to reach the patient enrolled in the [facility] Program (Post Inpatient/ED Visit).  Hilbert Odor RN, CCM [place]  VBCI-Population Health RN Care Manager [phone]

## 2024-02-12 ENCOUNTER — Telehealth: Payer: Self-pay

## 2024-02-12 DIAGNOSIS — I69328 Other speech and language deficits following cerebral infarction: Secondary | ICD-10-CM | POA: Diagnosis not present

## 2024-02-12 DIAGNOSIS — R29898 Other symptoms and signs involving the musculoskeletal system: Secondary | ICD-10-CM | POA: Diagnosis not present

## 2024-02-12 DIAGNOSIS — I69322 Dysarthria following cerebral infarction: Secondary | ICD-10-CM | POA: Diagnosis not present

## 2024-02-12 DIAGNOSIS — I69392 Facial weakness following cerebral infarction: Secondary | ICD-10-CM | POA: Diagnosis not present

## 2024-02-12 DIAGNOSIS — I69398 Other sequelae of cerebral infarction: Secondary | ICD-10-CM | POA: Diagnosis not present

## 2024-02-12 NOTE — Patient Instructions (Signed)
 Visit Information  Thank you for taking time to visit with me today. Please don't hesitate to contact me if I can be of assistance to you.     Following is a copy of your care plan:   Goals Addressed               This Visit's Progress     COMPLETED: Patient will report no readmissions in the next 30 days (pt-stated)        Current Barriers:  Patient and Family Knowledge Deficit related to signs/symptoms of CVA. Son was with patient and didn't recognize stroke symptoms 02/12/24 Daughter reports family is involved and providing care and Home Health is involved as well and denied need for additional TOC Follow up calls.  Knowledge Deficit related to importance of Eliquis and monitoring for GI bleed related to patient history. Daughter reports patient is currently taking Eliquis.02/12/24 Daughter reports they feel better with the decreased dose of Eliquis of MD appointment and states family is involved and providing care and Home Health is involved as well and denied need for additional TOC Follow up calls.  Knowledge deficit related to infection prevention related to self cath due to neurogenic bladder. Recently treated with Doxycycline and history of UTI 02/12/24 Daughter denies signs/symptoms of UTI and  reports family is involved and providing care and Home Health is involved as well and denied need for additional TOC Follow up calls.   RNCM Clinical Goal(s):  02/12/24 Daughter reports family is involved and providing care and Home Health is involved as well and denied need for additional TOC Follow up calls.    Interventions: Evaluation of current treatment plan related to  self management and patient's adherence to plan as established by provider  Transitions of Care:  Patient declined further engagement on this goal. 02/12/24 Daughter reports family is involved and providing care and Home Health is involved as well and denied need for additional TOC Follow up calls.   Stroke:   (Status:Patient declined further engagement on this goal.) Short Term Goal 02/12/24 Daughter reports family is involved and providing care and Home Health is involved as well and denied need for additional TOC Follow up calls.   Patient Goals/Self-Care Activities: 02/12/24 Daughter reports family is involved and providing care and Home Health is involved as well and denied need for additional TOC Follow up calls.    Follow Up Plan:  02/12/24 Daughter reports family is involved and providing care and Home Health is involved as well and denied need for additional TOC Follow up calls.  The patient has been provided with contact information for the care management team and has been advised to call with any health related questions or concerns.         Daughter reports family is involved and providing care and Home Health is involved as well and denied need for additional TOC Follow up calls.   Daughter verbalizes understanding of instructions and care plan provided today and agrees to view in MyChart. Active MyChart status and patient understanding of how to access instructions and care plan via MyChart confirmed with patient.     The daughter has been provided with contact information for the care management team and has been advised to call with any health related questions or concerns.   Please call the care guide team at [phone] if you have any new questions or concerns.   You can also call me directly at [phone].   Please call the Suicide and  Crisis Lifeline: 988 call the [place] National Suicide Prevention Lifeline: (207)592-2676 or TTY: (602)140-9036 TTY 815-485-7639) to talk to a trained counselor call 1-800-273-TALK (toll free, 24 hour hotline) call 911 if you are experiencing a Mental Health or Behavioral Health Crisis or need someone to talk to.  Hilbert Odor RN, CCM [place]  VBCI-Population Health RN Care Manager [phone]

## 2024-02-12 NOTE — Patient Outreach (Signed)
 Care Management  Transitions of Care Program Transitions of Care Post-discharge week 2   02/12/2024 Name: Nicole Perez MRN: [id number] DOB: 1933/05/03  Subjective: Nicole Perez is a 89 y.o. year old female who is a primary care patient of McDiarmid, Leighton Roach, MD. The Care Management team Engaged with patient Engaged with patient by telephone to assess and address transitions of care needs.   Consent to Services:  Patient was given information about care management services, agreed to services, and gave verbal consent to participate.   Assessment:   Daughter reports family is involved and providing care and Home Health is involved as well and denied need for additional TOC Follow up calls.         SDOH Interventions    Flowsheet Row Telephone from 02/03/2024 in [place] POPULATION HE[facility] DEPARTMENT Clinical Support from 07/01/2023 in [facility] [place] Telephone from 10/29/2022 in [facility] Community Care Coordination  SDOH Interventions     Food Insecurity Interventions Intervention Not Indicated Intervention Not Indicated --  Housing Interventions Intervention Not Indicated Intervention Not Indicated Intervention Not Indicated  Transportation Interventions Intervention Not Indicated Intervention Not Indicated Intervention Not Indicated  Utilities Interventions Intervention Not Indicated Intervention Not Indicated --  Alcohol Usage Interventions -- Intervention Not Indicated (Score <7) --  Financial Strain Interventions -- Intervention Not Indicated --  Physical Activity Interventions -- Intervention Not Indicated --  Stress Interventions -- Intervention Not Indicated --  Social Connections Interventions -- Intervention Not Indicated --  Health Literacy Interventions -- Intervention Not Indicated --        Goals Addressed               This Visit's Progress     COMPLETED: Patient will report no readmissions  in the next 30 days (pt-stated)        Current Barriers:  Patient and Family Knowledge Deficit related to signs/symptoms of CVA. Son was with patient and didn't recognize stroke symptoms 02/12/24 Daughter reports family is involved and providing care and Home Health is involved as well and denied need for additional TOC Follow up calls.  Knowledge Deficit related to importance of Eliquis and monitoring for GI bleed related to patient history. Daughter reports patient is currently taking Eliquis.02/12/24 Daughter reports they feel better with the decreased dose of Eliquis of MD appointment and states family is involved and providing care and Home Health is involved as well and denied need for additional TOC Follow up calls.  Knowledge deficit related to infection prevention related to self cath due to neurogenic bladder. Recently treated with Doxycycline and history of UTI 02/12/24 Daughter denies signs/symptoms of UTI and  reports family is involved and providing care and Home Health is involved as well and denied need for additional TOC Follow up calls.   RNCM Clinical Goal(s):  02/12/24 Daughter reports family is involved and providing care and Home Health is involved as well and denied need for additional TOC Follow up calls.    Interventions: Evaluation of current treatment plan related to  self management and patient's adherence to plan as established by provider  Transitions of Care:  Patient declined further engagement on this goal. 02/12/24 Daughter reports family is involved and providing care and Home Health is involved as well and denied need for additional TOC Follow up calls.   Stroke:  (Status:Patient declined further engagement on this goal.) Short Term Goal 02/12/24 Daughter reports family  is involved and providing care and Home Health is involved as well and denied need for additional TOC Follow up calls.   Patient Goals/Self-Care Activities: 02/12/24 Daughter reports family is involved  and providing care and Home Health is involved as well and denied need for additional TOC Follow up calls.    Follow Up Plan:  02/12/24 Daughter reports family is involved and providing care and Home Health is involved as well and denied need for additional TOC Follow up calls.  The patient has been provided with contact information for the care management team and has been advised to call with any health related questions or concerns.          Plan: 02/12/24 Daughter reports family is involved and providing care and Home Health is involved as well and denied need for additional TOC Follow up calls.   The daughter, Westley Hummer has been provided with contact information for the care management team and has been advised to call with any health related questions or concerns.   Hilbert Odor RN, CCM [place]  VBCI-Population Health RN Care Manager [phone]

## 2024-02-13 DIAGNOSIS — I69398 Other sequelae of cerebral infarction: Secondary | ICD-10-CM | POA: Diagnosis not present

## 2024-02-13 DIAGNOSIS — R29898 Other symptoms and signs involving the musculoskeletal system: Secondary | ICD-10-CM | POA: Diagnosis not present

## 2024-02-13 DIAGNOSIS — I69322 Dysarthria following cerebral infarction: Secondary | ICD-10-CM | POA: Diagnosis not present

## 2024-02-13 DIAGNOSIS — I69328 Other speech and language deficits following cerebral infarction: Secondary | ICD-10-CM | POA: Diagnosis not present

## 2024-02-13 DIAGNOSIS — I69392 Facial weakness following cerebral infarction: Secondary | ICD-10-CM | POA: Diagnosis not present

## 2024-02-14 ENCOUNTER — Encounter: Payer: Self-pay | Admitting: Student

## 2024-02-14 DIAGNOSIS — I69328 Other speech and language deficits following cerebral infarction: Secondary | ICD-10-CM | POA: Diagnosis not present

## 2024-02-14 DIAGNOSIS — I69392 Facial weakness following cerebral infarction: Secondary | ICD-10-CM | POA: Diagnosis not present

## 2024-02-14 DIAGNOSIS — I69322 Dysarthria following cerebral infarction: Secondary | ICD-10-CM | POA: Diagnosis not present

## 2024-02-14 DIAGNOSIS — R29898 Other symptoms and signs involving the musculoskeletal system: Secondary | ICD-10-CM | POA: Diagnosis not present

## 2024-02-14 DIAGNOSIS — I69398 Other sequelae of cerebral infarction: Secondary | ICD-10-CM | POA: Diagnosis not present

## 2024-02-18 ENCOUNTER — Encounter: Payer: Self-pay | Admitting: Family Medicine

## 2024-02-18 DIAGNOSIS — R29898 Other symptoms and signs involving the musculoskeletal system: Secondary | ICD-10-CM | POA: Diagnosis not present

## 2024-02-18 DIAGNOSIS — G319 Degenerative disease of nervous system, unspecified: Secondary | ICD-10-CM | POA: Insufficient documentation

## 2024-02-18 DIAGNOSIS — I672 Cerebral atherosclerosis: Secondary | ICD-10-CM | POA: Insufficient documentation

## 2024-02-18 DIAGNOSIS — I69392 Facial weakness following cerebral infarction: Secondary | ICD-10-CM | POA: Diagnosis not present

## 2024-02-18 DIAGNOSIS — I69322 Dysarthria following cerebral infarction: Secondary | ICD-10-CM | POA: Diagnosis not present

## 2024-02-18 DIAGNOSIS — R471 Dysarthria and anarthria: Secondary | ICD-10-CM | POA: Insufficient documentation

## 2024-02-18 DIAGNOSIS — I69398 Other sequelae of cerebral infarction: Secondary | ICD-10-CM | POA: Diagnosis not present

## 2024-02-18 DIAGNOSIS — I69328 Other speech and language deficits following cerebral infarction: Secondary | ICD-10-CM | POA: Diagnosis not present

## 2024-02-18 HISTORY — DX: Dysarthria and anarthria: R47.1

## 2024-02-19 ENCOUNTER — Encounter: Payer: Self-pay | Admitting: Family Medicine

## 2024-02-19 ENCOUNTER — Ambulatory Visit: Admitting: Family Medicine

## 2024-02-19 VITALS — BP 115/78 | HR 82 | Ht 59.0 in | Wt 143.0 lb

## 2024-02-19 DIAGNOSIS — M48062 Spinal stenosis, lumbar region with neurogenic claudication: Secondary | ICD-10-CM | POA: Diagnosis not present

## 2024-02-19 DIAGNOSIS — M461 Sacroiliitis, not elsewhere classified: Secondary | ICD-10-CM

## 2024-02-19 DIAGNOSIS — G894 Chronic pain syndrome: Secondary | ICD-10-CM

## 2024-02-19 DIAGNOSIS — M19071 Primary osteoarthritis, right ankle and foot: Secondary | ICD-10-CM

## 2024-02-19 DIAGNOSIS — G309 Alzheimer's disease, unspecified: Secondary | ICD-10-CM | POA: Diagnosis not present

## 2024-02-19 DIAGNOSIS — M150 Primary generalized (osteo)arthritis: Secondary | ICD-10-CM

## 2024-02-19 DIAGNOSIS — I6930 Unspecified sequelae of cerebral infarction: Secondary | ICD-10-CM

## 2024-02-19 DIAGNOSIS — R471 Dysarthria and anarthria: Secondary | ICD-10-CM | POA: Diagnosis not present

## 2024-02-19 DIAGNOSIS — N3000 Acute cystitis without hematuria: Secondary | ICD-10-CM

## 2024-02-19 DIAGNOSIS — G629 Polyneuropathy, unspecified: Secondary | ICD-10-CM

## 2024-02-19 DIAGNOSIS — M170 Bilateral primary osteoarthritis of knee: Secondary | ICD-10-CM

## 2024-02-19 DIAGNOSIS — M5441 Lumbago with sciatica, right side: Secondary | ICD-10-CM | POA: Diagnosis not present

## 2024-02-19 DIAGNOSIS — R2689 Other abnormalities of gait and mobility: Secondary | ICD-10-CM

## 2024-02-19 DIAGNOSIS — Z789 Other specified health status: Secondary | ICD-10-CM

## 2024-02-19 DIAGNOSIS — G319 Degenerative disease of nervous system, unspecified: Secondary | ICD-10-CM

## 2024-02-19 DIAGNOSIS — G63 Polyneuropathy in diseases classified elsewhere: Secondary | ICD-10-CM

## 2024-02-19 DIAGNOSIS — M160 Bilateral primary osteoarthritis of hip: Secondary | ICD-10-CM

## 2024-02-19 DIAGNOSIS — G8929 Other chronic pain: Secondary | ICD-10-CM

## 2024-02-19 DIAGNOSIS — E7800 Pure hypercholesterolemia, unspecified: Secondary | ICD-10-CM

## 2024-02-19 DIAGNOSIS — G959 Disease of spinal cord, unspecified: Secondary | ICD-10-CM

## 2024-02-19 DIAGNOSIS — F1190 Opioid use, unspecified, uncomplicated: Secondary | ICD-10-CM

## 2024-02-19 DIAGNOSIS — M4715 Other spondylosis with myelopathy, thoracolumbar region: Secondary | ICD-10-CM

## 2024-02-19 DIAGNOSIS — I672 Cerebral atherosclerosis: Secondary | ICD-10-CM

## 2024-02-19 DIAGNOSIS — Z79899 Other long term (current) drug therapy: Secondary | ICD-10-CM

## 2024-02-19 DIAGNOSIS — F0150 Vascular dementia without behavioral disturbance: Secondary | ICD-10-CM

## 2024-02-19 DIAGNOSIS — M19079 Primary osteoarthritis, unspecified ankle and foot: Secondary | ICD-10-CM

## 2024-02-19 DIAGNOSIS — Z9189 Other specified personal risk factors, not elsewhere classified: Secondary | ICD-10-CM | POA: Insufficient documentation

## 2024-02-19 DIAGNOSIS — F0280 Dementia in other diseases classified elsewhere without behavioral disturbance: Secondary | ICD-10-CM

## 2024-02-19 DIAGNOSIS — M47812 Spondylosis without myelopathy or radiculopathy, cervical region: Secondary | ICD-10-CM

## 2024-02-19 DIAGNOSIS — R54 Age-related physical debility: Secondary | ICD-10-CM | POA: Insufficient documentation

## 2024-02-19 DIAGNOSIS — M4804 Spinal stenosis, thoracic region: Secondary | ICD-10-CM

## 2024-02-19 LAB — POCT HEMOGLOBIN: Hemoglobin: 11.5 g/dL (ref 11–14.6)

## 2024-02-19 MED ORDER — ROSUVASTATIN CALCIUM 20 MG PO TABS: 20.0000 mg | ORAL_TABLET | Freq: Every day | ORAL | 3 refills | Status: AC

## 2024-02-19 MED ORDER — DOXYCYCLINE HYCLATE 100 MG PO TABS: 100.0000 mg | ORAL_TABLET | Freq: Two times a day (BID) | ORAL | 3 refills | Status: AC

## 2024-02-19 MED ORDER — HYDROCODONE-ACETAMINOPHEN 10-325 MG PO TABS: 1.0000 | ORAL_TABLET | Freq: Three times a day (TID) | ORAL | 0 refills | Status: DC | PRN

## 2024-02-19 NOTE — Assessment & Plan Note (Signed)
 Established problem. Stable. Patient is at goal of no dysuria.  Restarted home supply of Doxycycline for family-initiation of 5 day courses for onset of dysuria.

## 2024-02-19 NOTE — Assessment & Plan Note (Signed)
 Progression in Nicole Perez baseline cognitive dementia based on greater dependence in iADLs.  This may improve overtime. Currently mild stage without behavioral or psychological symptoms of dementia Will watch for depression related to medical condition.  Discussed role of activity (physical and social) in slowing decline in memory/cognition.  We discussed role of antidementia medications, benefits and risks.  They declined a trial.   Will need to see if patient and family are ready to discuss ACP at next office visit next month.

## 2024-02-19 NOTE — Assessment & Plan Note (Signed)
 Patient on Apixabn about 3 weeks. No bleeding noted. No new dizziness, near-syncope, palpitations.  POC Hgb 11.5 g/dl Stable Hgb Continue Apixaban 2.5 mg bid for systemic embolism in AF

## 2024-02-19 NOTE — Progress Notes (Signed)
 Patient ID: Nicole Perez, female   DOB: [birth year], 89 y.o.   MRN: [id number] Follow up outpatient visit after Hospitalization  Nicole Perez is accompanied by two daughter, including Nicole Perez.  Sources of clinical information for visit is/are patient and relative(s). The Discharge Summary for the hospitalization from 01/29/24 to 02/01/24 was reviewed.  Nursing assessment for this office visit was reviewed with the patient for accuracy and revision.   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 Imaging revealed an acute infarct involving the R MCA territory within the R frontal lobe/operculum along with moderate-severe stenoses involving several areas.  Unfortunately due to her history of significant GI bleeds, Eliquis was previously discontinued and family refused TNK upon arrival.  Her neuro exam was notable for slight left-sided facial droop and mild LUE weakness.  Echo revealed EF 60-65%, no RWMA, LA mildly dilated, AVR trivial, > 50% IVC respiratory variability.  Upon multiple discussions regarding risks vs benefits of DAPT vs restarting Eliquis vs not treating due to risk of GI bleeding.  Family decided that they would like to restart Eliquis 5 mg BID to prevent strokes in the future and hold off on DAPT.  Lipitor 40 mg daily was also initiated prior to discharge.   Atrial Fibrillation Was previously off of Eliquis due to having GI bleeds in the past.  Restarted Eliquis upon discharge.  Recommend discussing risk versus benefits further with family if desired.  Could also consider lowering Eliquis to 2.5 mg BID d/t risk.   Intermittent Self Catheterization of Bladder History of in and out caths at home for which her daughter assists in the setting of neurogenic bladder.  Care continued during  hospitalization with bladder scans.   Hypertension Lowered her Losartan-HCTZ dose to 25-12.5 mg daily due to lower blood pressures in the hospital.  Recommend titration as needed outpatient.  Hospital Physical Therapy evaluation 01/31/24    PTA daughter living with pt in single story home with ramped entrance. Daughter reports pt can have someone with her at all times. Pt was ambulating at home with RW and able to perform ADLs, has supervision for showers. Pt is currently limited in safe mobility by decreased safety awareness using RW in novel location, in presence of generalized weakness and mild balance deficits. Pt is min A for bed mobility, contact guard for transfers and min A for ambulation in room with RW. PT recommending HHPT at discharge. PT will continue to follow acutely.          If plan is discharge home, recommend the following: A little help with walking and/or transfers; A little help with baththing/dressing/bathroom; Assistance with cooking/housework;Direct supervision/assist for medications management;Direct supervision/assist for financial management;Assist for transportation;Help with stairs or ramp for entrance;Supervision due to cognitive status    Hospital Occupational Therapy assessment 01/31/24 ADL Overall ADL's : Needs assistance/impaired Eating/Feeding: Set up;Sitting Grooming: Set up;Sitting Upper Body Bathing: Set up;Sitting Lower Body Bathing: Minimal assistance;Sitting/lateral leans Upper Body Dressing : Sitting;Set up Lower Body Dressing: Maximal assistance;Sitting/lateral leans Toilet Transfer: Contact guard assist;Rolling walker (2 wheels);Stand-pivot Toileting- Architect and Hygiene: Sit to/from stand;Maximal assistance Functional mobility during ADLs: Contact guard assist;Rolling walker (2 wheels) (pivot transfers) General ADL Comments: Limited assessment of pt independence due to being soiled and neededing to be efficiently cleaned. Pt making  efforts to assist with wiping herself off  Cognition was difficult to assess   ---------------------------------------------------------------------------------------------------------------------- Problems since hospital discharge:  Decreased appetite Will drink nutrition supplements.  Eating small portions of meals No pain/n/v with eating. No dysgeusia  (+) weight loss perceived by dgts    ---------------------------------------------------------------------------------------------------------------------- Follow up appointments with specialists:  Pending: Neurology ---------------------------------------------------------------------------------------------------------------------- New medications started during hospitalization: Apixaban 5 mg twice a day decreased to 2.5 mg twice a day 02/07/24; Losartan-hydrochlorothiazide 50-12.5 mg daily; Atorvastatin 40 mg daily Chronic medications stopped during hospitalization: Losartan-hydrochlorothiazide 100-12.5 md daily Patient's Medication List was updated in the EMR: yes --------------------------------------------------------------------------------------------------------------------- Home Health Services: Physical Therapy/Occupational Therapy.  Speech Therapy did not recommend further therapy.  Durable Medical Equipment: RW ---------------------------------------------------------------------------------------------------------------------    CPT E&M Office Visit Time Before Visit; reviewing medical records (e.g. recent visits, labs, studies): 5 minutes During Visit (F2F time): 20 minutes After Visit (discussion with family or HCP, prescribing, ordering, referring, calling result/recommendations or documenting on same day): 5 minutes Total Visit Time: 30 minutes

## 2024-02-19 NOTE — Assessment & Plan Note (Signed)
 Established problem Well Controlled. Patient is at goal of adequate pain control. No signs of complications, medication side effects, or red flags. PDMP reviewed.  Continue current hydrocodone/APAP 10/325. Refilled #120 tablets today.

## 2024-02-19 NOTE — Assessment & Plan Note (Signed)
 Established problem Nicole Perez started on atorvastatin 40 mg during hospitalization  Daughter concerned that some of the fatigue her mother is experiencing is due to the atorvastatin because this was her experience with atorvastatin.  Her symptoms resolved after switching to rosuvastatin.  Will stop atorvastatin and switch to rosuvastin 20 mg daily to see if there is any improvement with patient's fatigue.

## 2024-02-19 NOTE — Patient Instructions (Signed)
 Stop the Atorvastatin.  Replace it with rosuvastatin 20 mg tablet, one tablet daily.   Your hemoglobin is 11.5 g which is very good.  No sign of bleeding on the blood thinner.

## 2024-02-19 NOTE — Assessment & Plan Note (Signed)
 Established problem Longstanding issue forr Nicole Perez only further complicated by her recent embolic stroke.  Nicole Perez is participating with [facility] Physical Therapy and Occupational Therapy

## 2024-02-20 DIAGNOSIS — R29898 Other symptoms and signs involving the musculoskeletal system: Secondary | ICD-10-CM | POA: Diagnosis not present

## 2024-02-20 DIAGNOSIS — I69392 Facial weakness following cerebral infarction: Secondary | ICD-10-CM | POA: Diagnosis not present

## 2024-02-20 DIAGNOSIS — I69328 Other speech and language deficits following cerebral infarction: Secondary | ICD-10-CM | POA: Diagnosis not present

## 2024-02-20 DIAGNOSIS — I69322 Dysarthria following cerebral infarction: Secondary | ICD-10-CM | POA: Diagnosis not present

## 2024-02-20 DIAGNOSIS — I69398 Other sequelae of cerebral infarction: Secondary | ICD-10-CM | POA: Diagnosis not present

## 2024-02-25 DIAGNOSIS — I69322 Dysarthria following cerebral infarction: Secondary | ICD-10-CM | POA: Diagnosis not present

## 2024-02-25 DIAGNOSIS — I69398 Other sequelae of cerebral infarction: Secondary | ICD-10-CM | POA: Diagnosis not present

## 2024-02-25 DIAGNOSIS — I69328 Other speech and language deficits following cerebral infarction: Secondary | ICD-10-CM | POA: Diagnosis not present

## 2024-02-25 DIAGNOSIS — R29898 Other symptoms and signs involving the musculoskeletal system: Secondary | ICD-10-CM | POA: Diagnosis not present

## 2024-02-25 DIAGNOSIS — I69392 Facial weakness following cerebral infarction: Secondary | ICD-10-CM | POA: Diagnosis not present

## 2024-02-27 DIAGNOSIS — I69398 Other sequelae of cerebral infarction: Secondary | ICD-10-CM | POA: Diagnosis not present

## 2024-02-27 DIAGNOSIS — I69328 Other speech and language deficits following cerebral infarction: Secondary | ICD-10-CM | POA: Diagnosis not present

## 2024-02-27 DIAGNOSIS — R29898 Other symptoms and signs involving the musculoskeletal system: Secondary | ICD-10-CM | POA: Diagnosis not present

## 2024-02-27 DIAGNOSIS — I69392 Facial weakness following cerebral infarction: Secondary | ICD-10-CM | POA: Diagnosis not present

## 2024-02-27 DIAGNOSIS — I69322 Dysarthria following cerebral infarction: Secondary | ICD-10-CM | POA: Diagnosis not present

## 2024-03-03 ENCOUNTER — Encounter: Payer: Self-pay | Admitting: Vascular Surgery

## 2024-03-03 DIAGNOSIS — I69328 Other speech and language deficits following cerebral infarction: Secondary | ICD-10-CM | POA: Diagnosis not present

## 2024-03-03 DIAGNOSIS — I69322 Dysarthria following cerebral infarction: Secondary | ICD-10-CM | POA: Diagnosis not present

## 2024-03-03 DIAGNOSIS — I69398 Other sequelae of cerebral infarction: Secondary | ICD-10-CM | POA: Diagnosis not present

## 2024-03-03 DIAGNOSIS — R29898 Other symptoms and signs involving the musculoskeletal system: Secondary | ICD-10-CM | POA: Diagnosis not present

## 2024-03-03 DIAGNOSIS — I69392 Facial weakness following cerebral infarction: Secondary | ICD-10-CM | POA: Diagnosis not present

## 2024-03-04 ENCOUNTER — Other Ambulatory Visit: Payer: Self-pay

## 2024-03-04 NOTE — Telephone Encounter (Signed)
 Patient's daughter calls nurse line requesting refill on BP medication. Daughter reports that patient is now out of BP medication.   Forwarding request to PCP.   Veronda Prude, RN

## 2024-03-05 ENCOUNTER — Telehealth: Payer: Self-pay

## 2024-03-05 DIAGNOSIS — I69322 Dysarthria following cerebral infarction: Secondary | ICD-10-CM | POA: Diagnosis not present

## 2024-03-05 DIAGNOSIS — I69328 Other speech and language deficits following cerebral infarction: Secondary | ICD-10-CM | POA: Diagnosis not present

## 2024-03-05 DIAGNOSIS — R29898 Other symptoms and signs involving the musculoskeletal system: Secondary | ICD-10-CM | POA: Diagnosis not present

## 2024-03-05 DIAGNOSIS — I69398 Other sequelae of cerebral infarction: Secondary | ICD-10-CM | POA: Diagnosis not present

## 2024-03-05 DIAGNOSIS — I69392 Facial weakness following cerebral infarction: Secondary | ICD-10-CM | POA: Diagnosis not present

## 2024-03-05 MED ORDER — LOSARTAN POTASSIUM-HCTZ 50-12.5 MG PO TABS
1.0000 | ORAL_TABLET | Freq: Every day | ORAL | 3 refills | Status: AC
Start: 2024-03-05 — End: ?

## 2024-03-05 NOTE — Telephone Encounter (Signed)
Onalee Hua from [facility] calling for PT verbal orders as follows:  1 time(s) weekly for 2 week(s).   Verbal orders given per [facility] protocol  Veronda Prude, RN

## 2024-03-06 ENCOUNTER — Other Ambulatory Visit: Payer: Self-pay

## 2024-03-07 ENCOUNTER — Other Ambulatory Visit: Payer: Self-pay | Admitting: Family Medicine

## 2024-03-09 MED ORDER — APIXABAN 2.5 MG PO TABS: 2.5000 mg | ORAL_TABLET | Freq: Two times a day (BID) | ORAL | 1 refills | Status: DC

## 2024-03-11 DIAGNOSIS — I69328 Other speech and language deficits following cerebral infarction: Secondary | ICD-10-CM | POA: Diagnosis not present

## 2024-03-11 DIAGNOSIS — I69322 Dysarthria following cerebral infarction: Secondary | ICD-10-CM | POA: Diagnosis not present

## 2024-03-11 DIAGNOSIS — R29898 Other symptoms and signs involving the musculoskeletal system: Secondary | ICD-10-CM | POA: Diagnosis not present

## 2024-03-11 DIAGNOSIS — I69392 Facial weakness following cerebral infarction: Secondary | ICD-10-CM | POA: Diagnosis not present

## 2024-03-11 DIAGNOSIS — I69398 Other sequelae of cerebral infarction: Secondary | ICD-10-CM | POA: Diagnosis not present

## 2024-03-12 ENCOUNTER — Encounter: Payer: Self-pay | Admitting: Family Medicine

## 2024-03-12 ENCOUNTER — Ambulatory Visit (INDEPENDENT_AMBULATORY_CARE_PROVIDER_SITE_OTHER): Admitting: Family Medicine

## 2024-03-12 VITALS — BP 114/88 | HR 60 | Ht 59.0 in | Wt 144.6 lb

## 2024-03-12 DIAGNOSIS — M170 Bilateral primary osteoarthritis of knee: Secondary | ICD-10-CM

## 2024-03-12 DIAGNOSIS — G629 Polyneuropathy, unspecified: Secondary | ICD-10-CM

## 2024-03-12 DIAGNOSIS — M150 Primary generalized (osteo)arthritis: Secondary | ICD-10-CM

## 2024-03-12 DIAGNOSIS — I6930 Unspecified sequelae of cerebral infarction: Secondary | ICD-10-CM | POA: Diagnosis not present

## 2024-03-12 DIAGNOSIS — G959 Disease of spinal cord, unspecified: Secondary | ICD-10-CM

## 2024-03-12 DIAGNOSIS — M4804 Spinal stenosis, thoracic region: Secondary | ICD-10-CM

## 2024-03-12 DIAGNOSIS — M5441 Lumbago with sciatica, right side: Secondary | ICD-10-CM | POA: Diagnosis not present

## 2024-03-12 DIAGNOSIS — I4891 Unspecified atrial fibrillation: Secondary | ICD-10-CM

## 2024-03-12 DIAGNOSIS — G63 Polyneuropathy in diseases classified elsewhere: Secondary | ICD-10-CM

## 2024-03-12 DIAGNOSIS — G894 Chronic pain syndrome: Secondary | ICD-10-CM

## 2024-03-12 DIAGNOSIS — F1190 Opioid use, unspecified, uncomplicated: Secondary | ICD-10-CM

## 2024-03-12 DIAGNOSIS — G8929 Other chronic pain: Secondary | ICD-10-CM

## 2024-03-12 DIAGNOSIS — M461 Sacroiliitis, not elsewhere classified: Secondary | ICD-10-CM

## 2024-03-12 DIAGNOSIS — M19079 Primary osteoarthritis, unspecified ankle and foot: Secondary | ICD-10-CM

## 2024-03-12 DIAGNOSIS — M19071 Primary osteoarthritis, right ankle and foot: Secondary | ICD-10-CM

## 2024-03-12 DIAGNOSIS — R42 Dizziness and giddiness: Secondary | ICD-10-CM

## 2024-03-12 DIAGNOSIS — M4715 Other spondylosis with myelopathy, thoracolumbar region: Secondary | ICD-10-CM

## 2024-03-12 DIAGNOSIS — M48062 Spinal stenosis, lumbar region with neurogenic claudication: Secondary | ICD-10-CM

## 2024-03-12 DIAGNOSIS — M47812 Spondylosis without myelopathy or radiculopathy, cervical region: Secondary | ICD-10-CM

## 2024-03-12 DIAGNOSIS — M160 Bilateral primary osteoarthritis of hip: Secondary | ICD-10-CM

## 2024-03-12 MED ORDER — HYDROCODONE-ACETAMINOPHEN 10-325 MG PO TABS: 1.0000 | ORAL_TABLET | Freq: Three times a day (TID) | ORAL | 0 refills | Status: DC | PRN

## 2024-03-12 MED ORDER — APIXABAN 2.5 MG PO TABS: 2.5000 mg | ORAL_TABLET | Freq: Two times a day (BID) | ORAL | 1 refills | Status: DC

## 2024-03-12 NOTE — Progress Notes (Signed)
    SUBJECTIVE:   CHIEF COMPLAINT / HPI:   Dizziness Began after her stroke on 01/30/2024.  Now has persistent symptoms worse with sitting and position changes but happens frequently.  Denies N/VD.  Limited mobility at baseline.  Seeing PT/OT at home for rehab, had "head turning" that they did to try to help with symptoms but it did not work.  PERTINENT  PMH / PSH: CVA, A-fib, neurogenic bladder, HTN, HLD  OBJECTIVE:   BP 114/88   Pulse 60   Ht 4\' 11"  (1.499 m)   Wt 144 lb 9.6 oz (65.6 kg)   LMP 12/17/1962   SpO2 98%   BMI 29.21 kg/m    General: Sitting up in wheelchair.  Speaking at loud volume given deafness in right ear. Cardiac: RRR, no murmurs. Respiratory: CTAB, normal effort, No wheezes, rales or rhonchi Extremities: no edema or cyanosis. Skin: warm and dry, no rashes noted Neuro: Wheelchair-bound.  Moves all extremities spontaneously. Psych: Normal affect and mood  ASSESSMENT/PLAN:   Assessment & Plan Dizziness Most likely secondary to CVA given symptoms started right after.  Extensive discussion with patient and daughter about risks with medications including possible fall that could be life-threatening given she is on anticoagulation. Ultimately opted for targeted vestibular rehab to assess for symptomatic improvement.  Very limited mobility and difficulty leaving the home, therefore will initiate home health therapy if possible. -Vestibular rehab Chronic bilateral low back pain with right-sided sciatica Chronic, utilizing Norco given pain with PT/OT.  PDMP reviewed and appropriate. -Refill Norco 10-325 every 8 hours as needed Atrial fibrillation, unspecified type (HCC) Refill apixaban 2.5 mg twice daily   Dr. Elberta Fortis, DO [facility]

## 2024-03-12 NOTE — Patient Instructions (Signed)
 It was wonderful to see you today! Thank you for choosing [facility].   Please bring ALL of your medications with you to every visit.   Today we talked about:  As we discussed any medication for dizziness has the risk of falls and other issues for the safest route is to get you into vestibular rehab as it could potentially help. I refilled your blood thinner and pain medication.  If you have any issues or concerns please let us know.  Please follow up with PCP as scheduled  Call the clinic at [phone] if your symptoms worsen or you have any concerns.  Please be sure to schedule follow up at the front desk before you leave today.   Elberta Fortis, DO Family Medicine

## 2024-03-15 NOTE — Assessment & Plan Note (Signed)
 Refill apixaban 2.5 mg twice daily

## 2024-03-15 NOTE — Assessment & Plan Note (Signed)
 Chronic, utilizing Norco given pain with PT/OT.  PDMP reviewed and appropriate. -Refill Norco 10-325 every 8 hours as needed

## 2024-03-17 ENCOUNTER — Telehealth: Payer: Self-pay

## 2024-03-17 NOTE — Telephone Encounter (Signed)
 Received VM from [facility] health regarding patient's catheter supplies.   Representative states that they need a written order and relevant office notes to be faxed back to them in order to process order.   She reports that paperwork was faxed on 03/12/24.  Checked provider's box, paperwork not in PCP box.   Including Dr. Perley Jain and admin team to determine if paperwork was received or if we need to call and ask them to re-fax.   Phone number for Emi [place] is [phone]  Veronda Prude, RN

## 2024-03-18 DIAGNOSIS — I69392 Facial weakness following cerebral infarction: Secondary | ICD-10-CM | POA: Diagnosis not present

## 2024-03-18 DIAGNOSIS — I69322 Dysarthria following cerebral infarction: Secondary | ICD-10-CM | POA: Diagnosis not present

## 2024-03-18 DIAGNOSIS — I69328 Other speech and language deficits following cerebral infarction: Secondary | ICD-10-CM | POA: Diagnosis not present

## 2024-03-18 DIAGNOSIS — R29898 Other symptoms and signs involving the musculoskeletal system: Secondary | ICD-10-CM | POA: Diagnosis not present

## 2024-03-18 DIAGNOSIS — I69398 Other sequelae of cerebral infarction: Secondary | ICD-10-CM | POA: Diagnosis not present

## 2024-03-20 ENCOUNTER — Telehealth: Payer: Self-pay

## 2024-03-20 NOTE — Telephone Encounter (Signed)
Form signed and placed in fax pile. 

## 2024-03-20 NOTE — Telephone Encounter (Signed)
 Patient's daughter calls nurse line regarding status for vestibular rehab. Appears that per chart review, provider wanted to see if home health vestibular therapy would be possible.   Will forward to Dr. Ardyth Harps and Melvenia Beam for update.   Veronda Prude, RN

## 2024-03-23 NOTE — Telephone Encounter (Signed)
 I can not find an office that takes her insurance that offers this service. The daughter can call the pt insurance to get a list of [facility] agencies that offer PT at home and call around. If she finds one that will work out. I will be glad to send over the referral. Nicole Perez

## 2024-03-23 NOTE — Telephone Encounter (Signed)
 Daughter calls nurse line again in regards to Vestibular Rehab in the home.  If this is an option, please let me know the phone number so she can call them directly to set up.   She reports she does not want them calling the patient (her mother.)

## 2024-03-25 DIAGNOSIS — Z961 Presence of intraocular lens: Secondary | ICD-10-CM | POA: Diagnosis not present

## 2024-03-25 DIAGNOSIS — H26491 Other secondary cataract, right eye: Secondary | ICD-10-CM | POA: Diagnosis not present

## 2024-03-30 ENCOUNTER — Telehealth: Payer: Self-pay | Admitting: Family Medicine

## 2024-03-30 DIAGNOSIS — R42 Dizziness and giddiness: Secondary | ICD-10-CM

## 2024-03-30 NOTE — Telephone Encounter (Signed)
 Patient's daughter came in asking if Dr McDiarmid could call her please.She was asking about the in home rehab.

## 2024-03-30 NOTE — Telephone Encounter (Signed)
 Did the daughter identify herself?  Ms Wands has several daughters.  Could you find out which one is asking for a call?  Thank you, Ena Harries

## 2024-03-30 NOTE — Telephone Encounter (Signed)
 Contacted the patient daughter Corbin Dess) back to see if it was her that contacted us . She did not answer I left her a voicemail to give us  a call back.

## 2024-03-30 NOTE — Telephone Encounter (Signed)
 Patient's daughter, Nicole Perez, returns call to nurse line. Advised of message from [place] regarding in home rehab.   They will call insurance company and see which agencies are covered within her plan.   She will call back when she has an update.   Elsie Halo, RN

## 2024-03-30 NOTE — Telephone Encounter (Signed)
 I believe it was Corbin Dess, she had originally called the nurse line last week about the in home therapy.

## 2024-03-31 NOTE — Telephone Encounter (Signed)
 Patients daughter advised to to contact insurance for Vestibular Rehab options.

## 2024-04-02 ENCOUNTER — Telehealth: Payer: Self-pay

## 2024-04-02 NOTE — Telephone Encounter (Signed)
 [place] returns call to nurse line. She is requesting that referral for vestibular rehab be sent to [facility]- [address] Fax number is [phone].  Forwarding to PCP and referral coordinator.   Elsie Halo, RN

## 2024-04-02 NOTE — Addendum Note (Signed)
 Addended byArita Belch, Marlies Ligman D on: 04/02/2024 02:20 PM   Modules accepted: Orders

## 2024-04-02 NOTE — Telephone Encounter (Signed)
 Received a call from one of Nicole Perez daughter. No name or phone number was left on VM for a return call. The daughter asked for a physical therapy referral be sent to  [facility]  Fax number [phone] I have printed and faxed the referral.  Rosanna Comment, CMA

## 2024-04-02 NOTE — Telephone Encounter (Signed)
 Referral made for vestibvular rehab at [facility]- [address] Fax number is [phone].

## 2024-04-06 DIAGNOSIS — R42 Dizziness and giddiness: Secondary | ICD-10-CM | POA: Diagnosis not present

## 2024-04-07 ENCOUNTER — Telehealth: Payer: Self-pay | Admitting: Family Medicine

## 2024-04-07 NOTE — Telephone Encounter (Signed)
 Patient's daughter Urban [place] came in stating that they were told by physical therapy that her problem was not vertigo and that she needed a referral for neurology. Asks if a referral can be sent to [facility], fax # [phone].

## 2024-04-08 ENCOUNTER — Other Ambulatory Visit: Payer: Self-pay | Admitting: Family Medicine

## 2024-04-08 DIAGNOSIS — R42 Dizziness and giddiness: Secondary | ICD-10-CM

## 2024-04-13 ENCOUNTER — Other Ambulatory Visit: Payer: Self-pay

## 2024-04-13 DIAGNOSIS — G8929 Other chronic pain: Secondary | ICD-10-CM

## 2024-04-13 MED ORDER — HYDROCODONE-ACETAMINOPHEN 10-325 MG PO TABS: 1.0000 | ORAL_TABLET | Freq: Three times a day (TID) | ORAL | 0 refills | Status: DC | PRN

## 2024-05-12 ENCOUNTER — Other Ambulatory Visit: Payer: Self-pay

## 2024-05-12 DIAGNOSIS — G8929 Other chronic pain: Secondary | ICD-10-CM

## 2024-05-12 MED ORDER — HYDROCODONE-ACETAMINOPHEN 10-325 MG PO TABS
1.0000 | ORAL_TABLET | Freq: Three times a day (TID) | ORAL | 0 refills | Status: DC | PRN
Start: 2024-05-12 — End: 2024-06-08

## 2024-05-21 ENCOUNTER — Encounter: Payer: Self-pay | Admitting: Neurology

## 2024-05-21 ENCOUNTER — Ambulatory Visit: Payer: Self-pay | Admitting: Neurology

## 2024-05-21 VITALS — BP 128/80 | HR 68 | Ht 59.0 in

## 2024-05-21 DIAGNOSIS — I63411 Cerebral infarction due to embolism of right middle cerebral artery: Secondary | ICD-10-CM

## 2024-05-21 DIAGNOSIS — R42 Dizziness and giddiness: Secondary | ICD-10-CM

## 2024-05-21 NOTE — Progress Notes (Signed)
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 Family: More than three times a week    Frequency of Social Gatherings with Friends and Family: Three times a week    Attends Religious Services: More than 4 times per year    Active Member of Clubs or Organizations: No    Attends Banker Meetings: Never    Marital Status: Widowed  Intimate Partner Violence: Patient Unable To Answer (02/03/2024)   Humiliation, Afraid, Rape, and Kick questionnaire    Fear of Current or Ex-Partner: Patient unable to answer    Emotionally Abused: Patient unable to answer    Physically Abused: Patient unable to answer    Sexually Abused: Patient unable to answer     PHYSICAL EXAM  GENERAL EXAM/CONSTITUTIONAL: Vitals:  Vitals:   05/21/24 1421  BP: 128/80  Pulse: 68  Height: 4\' 11"  (1.499 m)   Body mass index is 29.21 kg/m. Wt  Readings from Last 3 Encounters:  03/12/24 144 lb 9.6 oz (65.6 kg)  02/19/24 143 lb (64.9 kg)  01/30/24 151 lb 3.8 oz (68.6 kg)   Patient is in no distress; well developed, nourished and groomed; neck is supple, hard of hearing   MUSCULOSKELETAL: Gait, strength, tone, movements noted in Neurologic exam below  NEUROLOGIC: MENTAL STATUS:      No data to display         awake, alert, oriented to person, place and time normal attention and concentration language fluent, comprehension intact, naming intact fund of knowledge appropriate  CRANIAL NERVE:  2nd, 3rd, 4th, 6th - Visual fields full to confrontation, extraocular muscles intact, no nystagmus 5th - facial sensation symmetric 7th - facial strength symmetric 8th - hearing intact 9th - palate elevates symmetrically, uvula midline 11th - shoulder shrug symmetric 12th - tongue protrusion midline  MOTOR:  normal bulk and tone, full strength in the BUE, BLE  SENSORY:  normal and symmetric to light touch  COORDINATION:  finger-nose-finger, fine finger movements normal  GAIT/STATION:  Deferred, using a wheelchair   DIAGNOSTIC DATA (LABS, IMAGING, TESTING) - I reviewed patient records, labs, notes, testing and imaging myself where available.  Lab Results  Component Value Date   WBC 4.8 02/07/2024   HGB 11.5 02/19/2024   HCT 37.7 02/07/2024   MCV 87 02/07/2024   PLT 312 02/07/2024      Component Value Date/Time   NA 141 02/07/2024 1621   K 3.7 02/07/2024 1621   CL 103 02/07/2024 1621   CO2 22 02/07/2024 1621   GLUCOSE 129 (H) 02/07/2024 1621   GLUCOSE 95 02/01/2024 0602   BUN 30 (H) 02/07/2024 1621   CREATININE 1.37 (H) 02/07/2024 1621   CREATININE 0.78 04/16/2016 1649   CALCIUM  9.1 02/07/2024 1621   PROT 7.3 01/30/2024 1109  PROT 6.8 09/13/2022 1446   ALBUMIN 3.6 01/30/2024 1109   ALBUMIN 4.2 09/13/2022 1446   AST 21 01/30/2024 1109   ALT 13 01/30/2024 1109   ALKPHOS 48 01/30/2024 1109   BILITOT 0.8  01/30/2024 1109   BILITOT 0.4 09/13/2022 1446   GFRNONAA 48 (L) 02/01/2024 0602   GFRNONAA 72 04/16/2016 1649   GFRAA 55 (L) 09/29/2020 1038   GFRAA 83 04/16/2016 1649   Lab Results  Component Value Date   CHOL 129 01/31/2024   HDL 37 (L) 01/31/2024   LDLCALC 78 01/31/2024   LDLDIRECT 106 04/26/2009   TRIG 68 01/31/2024   CHOLHDL 3.5 01/31/2024   Lab Results  Component Value Date   HGBA1C 5.4 01/31/2024   Lab Results  Component Value Date   VITAMINB12 291 10/26/2021   Lab Results  Component Value Date   TSH 2.710 09/13/2022    MRI Brain 01/30/2024 1. 3.9 x 4.2 cm acute cortical/subcortical right middle cerebral artery territory infarct within the mid right frontal lobe/frontal operculum. 2. Background parenchymal atrophy, chronic small vessel ischemic disease and chronic infarcts, as described. 3. Postoperative changes and multiple cystic/nodular lesions again noted within the right parotid space, not significantly changed from the prior brain MRI of 08/12/2014. Correlate with the surgical history.  CTA neck 01/30/2024: 1. The common carotid and internal carotid arteries are patent within the neck without hemodynamically significant stenosis. Mild atherosclerotic plaque bilaterally, as described. 2. Vertebral arteries patent within the neck without stenosis or significant atherosclerotic disease. 3. Aortic Atherosclerosis (ICD10-I70.0) and Emphysema (ICD10-J43.9).   CTA head 01/30/2024: 1. Intracranial atherosclerotic disease with multifocal stenoses, most notably as follows. 2. Severe near-occlusive stenosis of the right posterior cerebral artery P2 segment. 3. Up to moderate stenosis of the cavernous right internal carotid artery. 4. Moderate/severe stenosis of the cavernous left internal carotid artery. 5. Moderate stenosis of the right middle cerebral artery proximal M1 segment. 6. Severe stenosis of the left middle cerebral artery proximal M1 segment  ASSESSMENT AND  PLAN  89 y.o. year old female with history of atrial fibrillation on Eliquis , right hemispheric stroke, hypertension, hyperlipidemia who is presenting with dizziness following a stroke.  Dizziness is most likely sequela from the stroke and dizziness is improving per patient.  Advised her to continue her current medications and to increase her fluid intake at over time her symptoms should get better. In terms of the stroke, etiology is likely cardioembolic as patient was not anticoagulated at that time.  She is currently on Eliquis  2.5 mg twice daily, advised her to continue current medications, continue to follow with PCP and return as needed.  She voiced understanding.   1. Cerebrovascular accident (CVA) due to embolism of right middle cerebral artery (HCC)   2. Dizziness      Patient Instructions  Continue current medication including Eliquis  2.5 mg twice daily Encourage fluid intake, drink plenty of water Continue to follow with your PCP Return as needed  No orders of the defined types were placed in this encounter.   No orders of the defined types were placed in this encounter.   Return if symptoms worsen or fail to improve.  I personally spent a total of 50 minutes in the care of the patient today including preparing to see the patient, getting/reviewing separately obtained history, performing a medically appropriate exam/evaluation, counseling and educating, documenting clinical information in the EHR, independently interpreting results, and communicating results.   Cassandra Cleveland, MD 05/21/2024, 4:49 PM  [facility]  [address] [place], [place] [address] [phone]

## 2024-05-21 NOTE — Patient Instructions (Signed)
 Continue current medication including Eliquis  2.5 mg twice daily Encourage fluid intake, drink plenty of water Continue to follow with your PCP Return as needed

## 2024-06-08 ENCOUNTER — Other Ambulatory Visit: Payer: Self-pay

## 2024-06-08 DIAGNOSIS — G8929 Other chronic pain: Secondary | ICD-10-CM

## 2024-06-08 MED ORDER — HYDROCODONE-ACETAMINOPHEN 10-325 MG PO TABS: 1.0000 | ORAL_TABLET | Freq: Three times a day (TID) | ORAL | 0 refills | Status: DC | PRN

## 2024-06-15 ENCOUNTER — Other Ambulatory Visit: Payer: Self-pay

## 2024-06-15 DIAGNOSIS — I4891 Unspecified atrial fibrillation: Secondary | ICD-10-CM

## 2024-06-16 HISTORY — PX: EYE SURGERY: SHX253

## 2024-06-17 DIAGNOSIS — H5213 Myopia, bilateral: Secondary | ICD-10-CM | POA: Diagnosis not present

## 2024-06-17 DIAGNOSIS — H1789 Other corneal scars and opacities: Secondary | ICD-10-CM | POA: Diagnosis not present

## 2024-06-17 DIAGNOSIS — H26493 Other secondary cataract, bilateral: Secondary | ICD-10-CM | POA: Diagnosis not present

## 2024-06-17 DIAGNOSIS — Z961 Presence of intraocular lens: Secondary | ICD-10-CM | POA: Diagnosis not present

## 2024-06-17 DIAGNOSIS — H43813 Vitreous degeneration, bilateral: Secondary | ICD-10-CM | POA: Diagnosis not present

## 2024-06-17 DIAGNOSIS — H524 Presbyopia: Secondary | ICD-10-CM | POA: Diagnosis not present

## 2024-06-17 DIAGNOSIS — H52203 Unspecified astigmatism, bilateral: Secondary | ICD-10-CM | POA: Diagnosis not present

## 2024-06-17 MED ORDER — APIXABAN 2.5 MG PO TABS: 2.5000 mg | ORAL_TABLET | Freq: Two times a day (BID) | ORAL | 0 refills | Status: DC

## 2024-06-23 DIAGNOSIS — H26491 Other secondary cataract, right eye: Secondary | ICD-10-CM | POA: Diagnosis not present

## 2024-06-30 ENCOUNTER — Ambulatory Visit: Payer: Self-pay | Admitting: Neurology

## 2024-07-06 ENCOUNTER — Ambulatory Visit: Payer: Medicare Other | Admitting: *Deleted

## 2024-07-06 VITALS — Ht 59.0 in | Wt 145.0 lb

## 2024-07-06 DIAGNOSIS — Z0000 Encounter for general adult medical examination without abnormal findings: Secondary | ICD-10-CM | POA: Diagnosis not present

## 2024-07-06 NOTE — Progress Notes (Signed)
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 Rehabilitation) Janifer Duwaine CROME, [place] (Inactive) as Physician Assistant (Urology) Lauro Shona LABOR, Nicole Perez as Registered Nurse  I have updated your Care Teams any recent Medical Services you may have received from other providers in the past year.     Assessment:   This is a routine wellness examination for McKittrick.  Hearing/Vision screen Hearing Screening - Comments:: Wears aids Vision Screening - Comments:: glasses   Goals Addressed             This Visit's Progress    Patient Stated       Wants to get her balance right       Depression Screen     07/06/2024    4:00 PM 03/12/2024    3:04 PM 02/19/2024    9:38 AM 02/07/2024    3:00 PM 11/28/2023    3:08 PM 07/01/2023     5:08 PM 06/12/2023    2:58 PM  PHQ 2/9 Scores  PHQ - 2 Score 0 0 0 0 0 0 0  PHQ- 9 Score 0 1 3 4 2  0 0    Fall Risk     07/06/2024    3:56 PM 03/12/2024    3:04 PM 02/19/2024    9:38 AM 02/07/2024    3:00 PM 11/28/2023    3:08 PM  Fall Risk   Falls in the past year? 0 0 0 0 0  Number falls in past yr: 0 0 0 0 0  Injury with Fall? 0 0 0 0 0  Risk for fall due to : No Fall Risks   History of fall(s)   Follow up Falls evaluation completed;Falls prevention discussed   Falls evaluation completed     MEDICARE RISK AT HOME:  Medicare Risk at Home Any Perez in or around the home?: No If so, are there any without handrails?: No Home free of loose throw rugs in walkways, pet beds, electrical cords, etc?: Yes Adequate lighting in your home to reduce risk of falls?: Yes Life alert?: No Use of a cane, walker or w/c?: Yes Grab bars in the bathroom?: No Shower chair or bench in shower?: Yes Elevated toilet seat or a handicapped toilet?: Yes  TIMED UP AND GO:  Was the test performed?  No  Cognitive Function: 6CIT completed        07/06/2024    4:07 PM 07/01/2023    5:14 PM  6CIT Screen  What Year? 0 points 0 points  What month? 0 points 0 points  What time? 0 points 0 points  Count back from 20 2 points 0 points  Months in reverse 4 points 2 points  Repeat phrase 0 points 4 points  Total Score 6 points 6 points    Immunizations Immunization History  Administered Date(s) Administered   Fluad Quad(high Dose 65+) 10/29/2019, 09/29/2020, 10/26/2021, 09/13/2022   Fluad Trivalent(High Dose 65+) 11/28/2023   Influenza Split 09/30/2012   Influenza,inj,Quad PF,6+ Mos 08/10/2013, 08/31/2014, 11/17/2015, 09/13/2016, 08/22/2017, 02/12/2019   PFIZER(Purple Top)SARS-COV-2 Vaccination 02/06/2020, 03/01/2020, 09/29/2020   Pfizer Covid-19 Vaccine Bivalent Booster 26yrs & up 10/26/2021   Pneumococcal Conjugate-13 03/11/2014   Pneumococcal Polysaccharide-23 12/18/2003   Td 07/10/2010    Zoster Recombinant(Shingrix ) 02/06/2022, 04/03/2022    Screening Tests Health Maintenance  Topic Date Due   DTaP/Tdap/Td (2 - Tdap) 07/10/2020   COVID-19 Vaccine (5 - 2024-25 season) 08/18/2023   Medicare Annual Wellness (AWV)  06/30/2024   INFLUENZA VACCINE  07/17/2024   Pneumococcal Vaccine: 50+ Years  Completed   Zoster Vaccines- Shingrix   Completed   Hepatitis B Vaccines  Aged Out   HPV VACCINES  Aged Out   Meningococcal B Vaccine  Aged Out    Health Maintenance  Health Maintenance Due  Topic Date Due   DTaP/Tdap/Td (2 - Tdap) 07/10/2020   COVID-19 Vaccine (5 - 2024-25 season) 08/18/2023   Medicare Annual Wellness (AWV)  06/30/2024   Health Maintenance Items Addressed: Discussed the need to update tetanus (Tdap) and covid vaccines  Additional Screening:  Vision Screening: Recommended annual ophthalmology exams for early detection of glaucoma and other disorders of the eye.  Up to date  [facility] Would you like a referral to an eye doctor? No    Dental Screening: Recommended annual dental exams for proper oral hygiene  Community Resource Referral / Chronic Care Management: CRR required this visit?  No   CCM required this visit?  No   Plan:    I have personally reviewed and noted the following in the patient's chart:   Medical and social history Use of alcohol, tobacco or illicit drugs  Current medications and supplements including opioid prescriptions. Patient is currently taking opioid prescriptions. Information provided to patient regarding non-opioid alternatives. Patient advised to discuss non-opioid treatment plan with their provider. Functional ability and status Nutritional status Physical activity Advanced directives List of other physicians Hospitalizations, surgeries, and ER visits in previous 12 months Vitals Screenings to include cognitive, depression, and falls Referrals and appointments  In addition, I have reviewed and discussed with  patient certain preventive protocols, quality metrics, and best practice recommendations. A written personalized care plan for preventive services as well as general preventive health recommendations were provided to patient.   Angeline Fredericks, LPN   [id number]/7974   After Visit Summary: (MyChart) Due to this being a telephonic visit, the after visit summary with patients personalized plan was offered to patient via MyChart   Notes: Nothing significant to report at this time.

## 2024-07-06 NOTE — Patient Instructions (Addendum)
 Nicole Perez , Thank you for taking time out of your busy schedule to complete your Annual Wellness Visit with me. I enjoyed our conversation and look forward to speaking with you again next year. I, as well as your care team,  appreciate your ongoing commitment to your health goals. Please review the following plan we discussed and let me know if I can assist you in the future. Your Game plan/ To Do List    Referrals: If you haven't heard from the office you've been referred to, please reach out to them at the phone provided.  Remember to update your Tetanus (Tdap) Follow up Visits: Next Medicare AWV with our clinical staff: 07/12/25 @ 3:40   Have you seen your provider in the last 6 months (3 months if uncontrolled diabetes)? Yes Next Office Visit with your provider: Your daughter will be calling the office to schedule a follow-up  Clinician Recommendations:  Aim for 30 minutes of exercise or brisk walking, 6-8 glasses of water, and 5 servings of fruits and vegetables each day.       This is a list of the screening recommended for you and due dates:  Health Maintenance  Topic Date Due   DTaP/Tdap/Td vaccine (2 - Tdap) 07/10/2020   COVID-19 Vaccine (5 - 2024-25 season) 08/18/2023   Flu Shot  07/17/2024   Medicare Annual Wellness Visit  07/06/2025   Pneumococcal Vaccine for age over 38  Completed   Zoster (Shingles) Vaccine  Completed   Hepatitis B Vaccine  Aged Out   HPV Vaccine  Aged Out   Meningitis B Vaccine  Aged Out    Advanced directives: (Declined) Advance directive discussed with you today. Even though you declined this today, please call our office should you change your mind, and we can give you the proper paperwork for you to fill out. Advance Care Planning is important because it:  [x]  Makes sure you receive the medical care that is consistent with your values, goals, and preferences  [x]  It provides guidance to your family and loved ones and reduces their decisional burden  about whether or not they are making the right decisions based on your wishes. Managing Pain Without Opioids Opioids are strong medicines used to treat moderate to severe pain. For some people, especially those who have long-term (chronic) pain, opioids may not be the best choice for pain management due to: Side effects like nausea, constipation, and sleepiness. The risk of addiction (opioid use disorder). The longer you take opioids, the greater your risk of addiction. Pain that lasts for more than 3 months is called chronic pain. Managing chronic pain usually requires more than one approach and is often provided by a team of health care providers working together (multidisciplinary approach). Pain management may be done at a pain management center or pain clinic. How to manage pain without the use of opioids Use non-opioid medicines Non-opioid medicines for pain may include: Over-the-counter or prescription non-steroidal anti-inflammatory drugs (NSAIDs). These may be the first medicines used for pain. They work well for muscle and bone pain, and they reduce swelling. Acetaminophen . This over-the-counter medicine may work well for milder pain but not swelling. Antidepressants. These may be used to treat chronic pain. A certain type of antidepressant (tricyclics) is often used. These medicines are given in lower doses for pain than when used for depression. Anticonvulsants. These are usually used to treat seizures but may also reduce nerve (neuropathic) pain. Muscle relaxants. These relieve pain caused by sudden muscle  tightening (spasms). You may also use a pain medicine that is applied to the skin as a patch, cream, or gel (topical analgesic), such as a numbing medicine. These may cause fewer side effects than medicines taken by mouth. Do certain therapies as directed Some therapies can help with pain management. They include: Physical therapy. You will do exercises to gain strength and  flexibility. A physical therapist may teach you exercises to move and stretch parts of your body that are weak, stiff, or painful. You can learn these exercises at physical therapy visits and practice them at home. Physical therapy may also involve: Massage. Heat wraps or applying heat or cold to affected areas. Electrical signals that interrupt pain signals (transcutaneous electrical nerve stimulation, TENS). Weak lasers that reduce pain and swelling (low-level laser therapy). Signals from your body that help you learn to regulate pain (biofeedback). Occupational therapy. This helps you to learn ways to function at home and work with less pain. Recreational therapy. This involves trying new activities or hobbies, such as a physical activity or drawing. Mental health therapy, including: Cognitive behavioral therapy (CBT). This helps you learn coping skills for dealing with pain. Acceptance and commitment therapy (ACT) to change the way you think and react to pain. Relaxation therapies, including muscle relaxation exercises and mindfulness-based stress reduction. Pain management counseling. This may be individual, family, or group counseling.  Receive medical treatments Medical treatments for pain management include: Nerve block injections. These may include a pain blocker and anti-inflammatory medicines. You may have injections: Near the spine to relieve chronic back or neck pain. Into joints to relieve back or joint pain. Into nerve areas that supply a painful area to relieve body pain. Into muscles (trigger point injections) to relieve some painful muscle conditions. A medical device placed near your spine to help block pain signals and relieve nerve pain or chronic back pain (spinal cord stimulation device). Acupuncture. Follow these instructions at home Medicines Take over-the-counter and prescription medicines only as told by your health care provider. If you are taking pain medicine,  ask your health care providers about possible side effects to watch out for. Do not drive or use heavy machinery while taking prescription opioid pain medicine. Lifestyle  Do not use drugs or alcohol to reduce pain. If you drink alcohol, limit how much you have to: 0-1 drink a day for women who are not pregnant. 0-2 drinks a day for men. Know how much alcohol is in a drink. In the [place] one drink equals one 12 oz bottle of beer (355 mL), one 5 oz glass of wine (148 mL), or one 1 oz glass of hard liquor (44 mL). Do not use any products that contain nicotine or tobacco. These products include cigarettes, chewing tobacco, and vaping devices, such as e-cigarettes. If you need help quitting, ask your health care provider. Eat a healthy diet and maintain a healthy weight. Poor diet and excess weight may make pain worse. Eat foods that are high in fiber. These include fresh fruits and vegetables, whole grains, and beans. Limit foods that are high in fat and processed sugars, such as fried and sweet foods. Exercise regularly. Exercise lowers stress and may help relieve pain. Ask your health care provider what activities and exercises are safe for you. If your health care provider approves, join an exercise class that combines movement and stress reduction. Examples include yoga and tai chi. Get enough sleep. Lack of sleep may make pain worse. Lower stress as much as  possible. Practice stress reduction techniques as told by your therapist. General instructions Work with all your pain management providers to find the treatments that work best for you. You are an important member of your pain management team. There are many things you can do to reduce pain on your own. Consider joining an online or in-person support group for people who have chronic pain. Keep all follow-up visits. This is important. Where to find more information You can find more information about managing pain without opioids  from: American Academy of Pain Medicine: painmed.org Institute for Chronic Pain: instituteforchronicpain.org American Chronic Pain Association: theacpa.org Contact a health care provider if: You have side effects from pain medicine. Your pain gets worse or does not get better with treatments or home therapy. You are struggling with anxiety or depression. Summary Many types of pain can be managed without opioids. Chronic pain may respond better to pain management without opioids. Pain is best managed when you and a team of health care providers work together. Pain management without opioids may include non-opioid medicines, medical treatments, physical therapy, mental health therapy, and lifestyle changes. Tell your health care providers if your pain gets worse or is not being managed well enough. This information is not intended to replace advice given to you by your health care provider. Make sure you discuss any questions you have with your health care provider. Document Revised: 03/15/2021 Document Reviewed: 03/15/2021 Elsevier Patient Education  2024 [employer].

## 2024-07-08 ENCOUNTER — Other Ambulatory Visit: Payer: Self-pay

## 2024-07-08 DIAGNOSIS — G8929 Other chronic pain: Secondary | ICD-10-CM

## 2024-07-08 MED ORDER — HYDROCODONE-ACETAMINOPHEN 10-325 MG PO TABS: 1.0000 | ORAL_TABLET | Freq: Three times a day (TID) | ORAL | 0 refills | Status: DC | PRN

## 2024-07-31 ENCOUNTER — Other Ambulatory Visit: Payer: Self-pay

## 2024-07-31 DIAGNOSIS — K2971 Gastritis, unspecified, with bleeding: Secondary | ICD-10-CM

## 2024-07-31 DIAGNOSIS — G8929 Other chronic pain: Secondary | ICD-10-CM

## 2024-07-31 MED ORDER — PANTOPRAZOLE SODIUM 40 MG PO TBEC: 40.0000 mg | DELAYED_RELEASE_TABLET | Freq: Every day | ORAL | 3 refills | Status: AC

## 2024-07-31 MED ORDER — HYDROCODONE-ACETAMINOPHEN 10-325 MG PO TABS: 1.0000 | ORAL_TABLET | Freq: Three times a day (TID) | ORAL | 0 refills | Status: DC | PRN

## 2024-07-31 NOTE — Telephone Encounter (Signed)
 Fax from [facility] request from DOXEPIN  10mg . If you want patient to take this medication. Please send Rx to pharmacy with instructions, duration and dosage.Nelson Land, CMA

## 2024-08-06 DIAGNOSIS — H0100A Unspecified blepharitis right eye, upper and lower eyelids: Secondary | ICD-10-CM | POA: Diagnosis not present

## 2024-08-06 DIAGNOSIS — H0100B Unspecified blepharitis left eye, upper and lower eyelids: Secondary | ICD-10-CM | POA: Diagnosis not present

## 2024-08-27 ENCOUNTER — Encounter: Payer: Self-pay | Admitting: Family Medicine

## 2024-08-27 ENCOUNTER — Ambulatory Visit (INDEPENDENT_AMBULATORY_CARE_PROVIDER_SITE_OTHER): Admitting: Family Medicine

## 2024-08-27 VITALS — BP 170/139 | HR 120 | Ht 59.0 in | Wt 148.2 lb

## 2024-08-27 DIAGNOSIS — Z23 Encounter for immunization: Secondary | ICD-10-CM | POA: Diagnosis not present

## 2024-08-27 DIAGNOSIS — M4715 Other spondylosis with myelopathy, thoracolumbar region: Secondary | ICD-10-CM

## 2024-08-27 DIAGNOSIS — I10 Essential (primary) hypertension: Secondary | ICD-10-CM | POA: Diagnosis not present

## 2024-08-27 DIAGNOSIS — I739 Peripheral vascular disease, unspecified: Secondary | ICD-10-CM | POA: Diagnosis not present

## 2024-08-27 DIAGNOSIS — N1832 Chronic kidney disease, stage 3b: Secondary | ICD-10-CM | POA: Diagnosis not present

## 2024-08-27 DIAGNOSIS — I5032 Chronic diastolic (congestive) heart failure: Secondary | ICD-10-CM | POA: Diagnosis not present

## 2024-08-27 DIAGNOSIS — N1831 Chronic kidney disease, stage 3a: Secondary | ICD-10-CM

## 2024-08-27 DIAGNOSIS — M2042 Other hammer toe(s) (acquired), left foot: Secondary | ICD-10-CM | POA: Insufficient documentation

## 2024-08-27 DIAGNOSIS — Z79899 Other long term (current) drug therapy: Secondary | ICD-10-CM

## 2024-08-27 DIAGNOSIS — I7123 Aneurysm of the descending thoracic aorta, without rupture: Secondary | ICD-10-CM | POA: Diagnosis not present

## 2024-08-27 DIAGNOSIS — M5441 Lumbago with sciatica, right side: Secondary | ICD-10-CM | POA: Diagnosis not present

## 2024-08-27 DIAGNOSIS — M19079 Primary osteoarthritis, unspecified ankle and foot: Secondary | ICD-10-CM | POA: Insufficient documentation

## 2024-08-27 DIAGNOSIS — G8929 Other chronic pain: Secondary | ICD-10-CM

## 2024-08-27 DIAGNOSIS — Z862 Personal history of diseases of the blood and blood-forming organs and certain disorders involving the immune mechanism: Secondary | ICD-10-CM

## 2024-08-27 DIAGNOSIS — I4891 Unspecified atrial fibrillation: Secondary | ICD-10-CM

## 2024-08-27 DIAGNOSIS — J439 Emphysema, unspecified: Secondary | ICD-10-CM

## 2024-08-27 DIAGNOSIS — I700 Atherosclerosis of aorta: Secondary | ICD-10-CM | POA: Diagnosis not present

## 2024-08-27 MED ORDER — FUROSEMIDE 20 MG PO TABS: 20.0000 mg | ORAL_TABLET | Freq: Every day | ORAL | 3 refills | Status: AC | PRN

## 2024-08-27 MED ORDER — HYDROCODONE-ACETAMINOPHEN 10-325 MG PO TABS: 1.0000 | ORAL_TABLET | Freq: Four times a day (QID) | ORAL | 0 refills | Status: DC | PRN

## 2024-08-27 NOTE — Patient Instructions (Addendum)
 The hydrocodone -acetaminophen  tablets were increased to 120 tablets a month.   A referral was sent to get a CT of your chest to make sure your aorta is dilation is not getting bigger.

## 2024-08-28 DIAGNOSIS — Z862 Personal history of diseases of the blood and blood-forming organs and certain disorders involving the immune mechanism: Secondary | ICD-10-CM | POA: Insufficient documentation

## 2024-08-28 LAB — CBC
Hematocrit: 33.9 % — ABNORMAL LOW (ref 34.0–46.6)
Hemoglobin: 10.9 g/dL — ABNORMAL LOW (ref 11.1–15.9)
MCH: 29.4 pg (ref 26.6–33.0)
MCHC: 32.2 g/dL (ref 31.5–35.7)
MCV: 91 fL (ref 79–97)
Platelets: 250 x10E3/uL (ref 150–450)
RBC: 3.71 x10E6/uL — ABNORMAL LOW (ref 3.77–5.28)
RDW: 14.5 % (ref 11.7–15.4)
WBC: 3.8 x10E3/uL (ref 3.4–10.8)

## 2024-08-28 LAB — BASIC METABOLIC PANEL WITH GFR
BUN/Creatinine Ratio: 30 — ABNORMAL HIGH (ref 12–28)
BUN: 36 mg/dL — ABNORMAL HIGH (ref 8–27)
CO2: 23 mmol/L (ref 20–29)
Calcium: 9.4 mg/dL (ref 8.7–10.3)
Chloride: 104 mmol/L (ref 96–106)
Creatinine, Ser: 1.2 mg/dL — ABNORMAL HIGH (ref 0.57–1.00)
Glucose: 90 mg/dL (ref 70–99)
Potassium: 4.3 mmol/L (ref 3.5–5.2)
Sodium: 141 mmol/L (ref 134–144)
eGFR: 43 mL/min/1.73 — ABNORMAL LOW (ref >59)

## 2024-08-28 NOTE — Assessment & Plan Note (Signed)
 Established problem Well Controlled. Patient is at goal of rate control and anticoagulation. No signs of complications, medication side effects, or red flags. Continue current medications and other regiments.

## 2024-08-28 NOTE — Progress Notes (Signed)
 Nicole Perez is accompanied by daughter, Nicole Perez Sources of clinical information for visit is/are patient, relative(s), and PDMP. Nursing assessment for this office visit was reviewed with the patient for accuracy and revision.   Previous Report(s) Reviewed: none     07/06/2024    4:00 PM  Depression screen PHQ 2/9  Decreased Interest 0  Down, Depressed, Hopeless 0  PHQ - 2 Score 0  Altered sleeping 0  Tired, decreased energy 0  Change in appetite 0  Feeling bad or failure about yourself  0  Trouble concentrating 0  Moving slowly or fidgety/restless 0  Suicidal thoughts 0  PHQ-9 Score 0  Difficult doing work/chores Extremely dIfficult   Flowsheet Row Clinical Support from 07/06/2024 in [facility] - A Dept Of [place] Office Visit from 03/12/2024 in [facility] - A Dept Of [facility] Office Visit from 02[facility]19/2024 in [facility] - A Dept Of [place]  Thoughts that you would be better off dead, or of hurting yourself in some way Not at all Not at all Not at all  PHQ-9 Total Score 0 1 3       08/27/2024    2:39 PM 07/06/2024    3:56 PM 03/12/2024    3:04 PM 02/19/2024    9:38 AM 02/07/2024    3:00 PM  Fall Risk   Falls in the past year? 1 0 0 0 0  Number falls in past yr: 0 0 0 0 0  Injury with Fall? 0 0 0 0 0  Risk for fall due to :  No Fall Risks   History of fall(s)  Follow up  Falls evaluation completed;Falls prevention discussed   Falls evaluation completed       07/06/2024    4:00 PM 03/12/2024    3:04 PM 02/19/2024    9:38 AM  PHQ9 SCORE ONLY  PHQ-9 Total Score 0  1  3      Data saved with a previous flowsheet row definition    There are no preventive care reminders to display for this patient.  Health Maintenance Due  Topic Date Due   DTaP/Tdap/Td (2 - Tdap) 07/10/2020   COVID-19 Vaccine (5 - 2025-26 season) 08/17/2024      History/P.E.  limitations: HOH  There are no preventive care reminders to display for this patient. There are no preventive care reminders to display for this patient.  Health Maintenance Due  Topic Date Due   DTaP/Tdap/Td (2 - Tdap) 07/10/2020   COVID-19 Vaccine (5 - 2025-26 season) 08/17/2024     Chief Complaint  Patient presents with   Annual Exam     Discussed the use of AI scribe software for clinical note transcription with the patient, who gave verbal consent to proceed.  History of Present Illness      SDOH Screenings   Food Insecurity: No Food Insecurity (07/06/2024)  Housing: Unknown (07/06/2024)  Transportation Needs: No Transportation Needs (07/06/2024)  Utilities: Not At Risk (07/06/2024)  Alcohol Screen: Low Risk  (07/06/2024)  Depression (PHQ2-9): Low Risk  (07/06/2024)  Financial Resource Strain: Low Risk  (07/06/2024)  Physical Activity: Inactive (07/06/2024)  Social Connections: Moderately Isolated (07/06/2024)  Stress: No Stress Concern Present (07/06/2024)  Tobacco Use: Medium Risk (08/27/2024)  Health Literacy: Adequate Health Literacy (07/06/2024)   --------------------------------------------------------------------------------------------------------------------------------------------- Visit Problem List with Assessment and Plan   Assessment and Plan Assessment &  Plan      Aortic aneurysm without rupture, descending (HCC) Surveillance CT Chest with Contrast ordered.  No symptoms of dissection  Atrial fibrillation (HCC) Established problem Well Controlled. Patient is at goal of rate control and anticoagulation. No signs of complications, medication side effects, or red flags. Continue current medications and other regiments.   Osteoarthritis of spine with myelopathy, thoracolumbar region Established problem Uncontrolled.  Patient is not at goal of adequate pain control. Ms Heiser is waking at night with pain for which she was used to taking a fourth dose of  hydrocodone -APAP 10-325. Somewhere in refilling Ms Shatz monthly hydrocodone -APAP 10-325 tablets, her usual #120 tablets per month was reduced to 390 per month.  PDMP reviewed. No high-risk behaviors noted.  No adverse effects reported.  Start: Return to hydrocodone -APAP 10-325 tablet, #120 tab per month. This will allow Ms Koeneman to have supply adequate for her to take a tablet, prn in night.     Chronic kidney disease, stage 3b (HCC)    Latest Ref Rng & Units 08/27/2024    3:30 PM 02/07/2024    4:21 PM 02/01/2024    6:02 AM  BMP  Glucose 70 - 99 mg/dL 90  870  95   BUN 8 - 27 mg/dL 36  30  14   Creatinine 0.57 - 1.00 mg/dL 8.79  8.62  8.88   BUN/Creat Ratio 12 - 28 30  22     Sodium 134 - 144 mmol/L 141  141  140   Potassium 3.5 - 5.2 mmol/L 4.3  3.7  3.7   Chloride 96 - 106 mmol/L 104  103  105   CO2 20 - 29 mmol/L 23  22  24    Calcium  8.7 - 10.3 mg/dL 9.4  9.1  9.4   Estimated Creatinine Clearance: 26.5 mL/min (A) (by C-G formula based on SCr of 1.2 mg/dL (H)).  Established problem. Stable. Patient is at goal of relkatively stable eGFR.  Progression primarily due to aging effect on renal function. No signs of complications, medication side effects, or red flags. Not a candidate for SGLT2i   Essential hypertension, benign Established problem Uncontrolled.  Patient is not at goal of SBP < 140. Ms Kaffenberger is currently in pain.  Will reassess when pain under better control  Continue current medications in the meantime.

## 2024-08-28 NOTE — Assessment & Plan Note (Addendum)
    Latest Ref Rng & Units 08/27/2024    3:30 PM 02/07/2024    4:21 PM 02/01/2024    6:02 AM  BMP  Glucose 70 - 99 mg/dL 90  870  95   BUN 8 - 27 mg/dL 36  30  14   Creatinine 0.57 - 1.00 mg/dL 8.79  8.62  8.88   BUN/Creat Ratio 12 - 28 30  22     Sodium 134 - 144 mmol/L 141  141  140   Potassium 3.5 - 5.2 mmol/L 4.3  3.7  3.7   Chloride 96 - 106 mmol/L 104  103  105   CO2 20 - 29 mmol/L 23  22  24    Calcium  8.7 - 10.3 mg/dL 9.4  9.1  9.4   Estimated Creatinine Clearance: 26.5 mL/min (A) (by C-G formula based on SCr of 1.2 mg/dL (H)).  Established problem. Stable. Patient is at goal of relkatively stable eGFR.  Progression primarily due to aging effect on renal function. No signs of complications, medication side effects, or red flags. Not a candidate for SGLT2i

## 2024-08-28 NOTE — Assessment & Plan Note (Signed)
 Established problem    Latest Ref Rng & Units 08/27/2024    3:30 PM 02/19/2024    9:46 AM 02/07/2024    4:21 PM  CBC  WBC 3.4 - 10.8 x10E3/uL 3.8   4.8   Hemoglobin 11.1 - 15.9 g/dL 89.0  88.4  87.6   Hematocrit 34.0 - 46.6 % 33.9   37.7   Platelets 150 - 450 x10E3/uL 250   312    Stable.  No iron supplementation indicated.

## 2024-08-28 NOTE — Assessment & Plan Note (Signed)
 Established problem Uncontrolled.  Patient is not at goal of SBP < 140. Nicole Perez is currently in pain.  Will reassess when pain under better control  Continue current medications in the meantime.

## 2024-08-28 NOTE — Assessment & Plan Note (Signed)
 Surveillance CT Chest with Contrast ordered.  No symptoms of dissection

## 2024-08-28 NOTE — Assessment & Plan Note (Signed)
 Established problem Uncontrolled.  Patient is not at goal of adequate pain control. Nicole Perez is waking at night with pain for which she was used to taking a fourth dose of hydrocodone -APAP 10-325. Somewhere in refilling Nicole Perez monthly hydrocodone -APAP 10-325 tablets, her usual #120 tablets per month was reduced to 390 per month.  PDMP reviewed. No high-risk behaviors noted.  No adverse effects reported.  Start: Return to hydrocodone -APAP 10-325 tablet, #120 tab per month. This will allow Nicole Perez to have supply adequate for her to take a tablet, prn in night.

## 2024-09-02 ENCOUNTER — Other Ambulatory Visit: Payer: Self-pay | Admitting: Family Medicine

## 2024-09-02 DIAGNOSIS — I7123 Aneurysm of the descending thoracic aorta, without rupture: Secondary | ICD-10-CM

## 2024-09-03 ENCOUNTER — Telehealth: Payer: Self-pay

## 2024-09-03 NOTE — Telephone Encounter (Signed)
 Leslie from [facility] called and LVM asking we change the CT Angio Chest order to CT Angio Chest Aorta.  Request for change has been sent to Dr McDiarmid.  Margit Dimes, CMA

## 2024-09-04 ENCOUNTER — Other Ambulatory Visit: Payer: Self-pay | Admitting: Family Medicine

## 2024-09-04 DIAGNOSIS — I7123 Aneurysm of the descending thoracic aorta, without rupture: Secondary | ICD-10-CM

## 2024-09-04 NOTE — Progress Notes (Deleted)
 Nicole Perez is {Pc accompanied by:5710} Sources of clinical information for visit is/are {Information source:60032}. Nursing assessment for this office visit was reviewed with the patient for accuracy and revision.   Previous Report(s) Reviewed: {Outside review:15817}     07/06/2024    4:00 PM  Depression screen PHQ 2/9  Decreased Interest 0  Down, Depressed, Hopeless 0  PHQ - 2 Score 0  Altered sleeping 0  Tired, decreased energy 0  Change in appetite 0  Feeling bad or failure about yourself  0  Trouble concentrating 0  Moving slowly or fidgety/restless 0  Suicidal thoughts 0  PHQ-9 Score 0  Difficult doing work/chores Extremely dIfficult   Flowsheet Row Clinical Support from 07/06/2024 in [facility] [place] Office Visit from 03/12/2024 in [facility] [place] [facility]. [facility] Office Visit from 02/19/2024 in [facility] Of . [facility]  Thoughts that you would be better off dead, or of hurting yourself in some way Not at all Not at all Not at all  PHQ-9 Total Score 0 1 3       08/27/2024    2:39 PM 07/06/2024    3:56 PM 03/12/2024    3:04 PM 02/19/2024    9:38 AM 02/07/2024    3:00 PM  Fall Risk   Falls in the past year? 1 0 0 0 0  Number falls in past yr: 0 0 0 0 0  Injury with Fall? 0 0 0 0 0  Risk for fall due to :  No Fall Risks   History of fall(s)  Follow up  Falls evaluation completed;Falls prevention discussed   Falls evaluation completed       07/06/2024    4:00 PM 03/12/2024    3:04 PM 02/19/2024    9:38 AM  PHQ9 SCORE ONLY  PHQ-9 Total Score 0  1  3      Data saved with a previous flowsheet row definition    There are no preventive care reminders to display for this patient.  Health Maintenance Due  Topic Date Due   DTaP/Tdap/Td (2 - Tdap) 07/10/2020   COVID-19 Vaccine (5 - 2025-26 season) 08/17/2024      History/P.E. limitations:  {exam; limitations ed:60112}  There are no preventive care reminders to display for this patient. There are no preventive care reminders to display for this patient.  Health Maintenance Due  Topic Date Due   DTaP/Tdap/Td (2 - Tdap) 07/10/2020   COVID-19 Vaccine (5 - 2025-26 season) 08/17/2024     No chief complaint on file.    Discussed the use of AI scribe software for clinical note transcription with the patient, who gave verbal consent to proceed.  History of Present Illness      SDOH Screenings   Food Insecurity: No Food Insecurity (07/06/2024)  Housing: Unknown (07/06/2024)  Transportation Needs: No Transportation Needs (07/06/2024)  Utilities: Not At Risk (07/06/2024)  Alcohol Screen: Low Risk  (07/06/2024)  Depression (PHQ2-9): Low Risk  (07/06/2024)  Financial Resource Strain: Low Risk  (07/06/2024)  Physical Activity: Inactive (07/06/2024)  Social Connections: Moderately Isolated (07/06/2024)  Stress: No Stress Concern Present (07/06/2024)  Tobacco Use: Medium Risk (08/27/2024)  Health Literacy: Adequate Health Literacy (07/06/2024)   --------------------------------------------------------------------------------------------------------------------------------------------- Visit Problem List with Assessment and Plan   Assessment and Plan Assessment & Plan  No problem-specific Assessment & Plan notes found for this encounter.

## 2024-09-28 ENCOUNTER — Other Ambulatory Visit: Payer: Self-pay

## 2024-09-28 DIAGNOSIS — G8929 Other chronic pain: Secondary | ICD-10-CM

## 2024-09-28 MED ORDER — HYDROCODONE-ACETAMINOPHEN 10-325 MG PO TABS
1.0000 | ORAL_TABLET | Freq: Four times a day (QID) | ORAL | 0 refills | Status: DC | PRN
Start: 2024-09-28 — End: 2024-10-27

## 2024-09-29 ENCOUNTER — Ambulatory Visit
Admission: RE | Admit: 2024-09-29 | Discharge: 2024-09-29 | Disposition: A | Discharge status: Home / Self Care | Source: Ambulatory Visit | Attending: Family Medicine | Admitting: Family Medicine

## 2024-09-29 DIAGNOSIS — I7123 Aneurysm of the descending thoracic aorta, without rupture: Secondary | ICD-10-CM

## 2024-09-29 DIAGNOSIS — I77810 Thoracic aortic ectasia: Secondary | ICD-10-CM | POA: Diagnosis not present

## 2024-09-29 MED ORDER — IOPAMIDOL (ISOVUE-370) INJECTION 76%
80.0000 mL | Freq: Once | INTRAVENOUS | Status: AC | PRN
  Administered 2024-09-29: 80 mL via INTRAVENOUS

## 2024-10-02 ENCOUNTER — Ambulatory Visit: Payer: Self-pay | Admitting: Family Medicine

## 2024-10-02 NOTE — Assessment & Plan Note (Signed)
 Addendum I discussed the CTA Chest results with Ms Zeiders' daughter, Allena. She was comfortable with the [facility] continuing to be the physicians who monitor the aneurysm with CTA Chest every 6 months.

## 2024-10-27 ENCOUNTER — Other Ambulatory Visit: Payer: Self-pay

## 2024-10-27 DIAGNOSIS — G8929 Other chronic pain: Secondary | ICD-10-CM

## 2024-10-28 MED ORDER — HYDROCODONE-ACETAMINOPHEN 10-325 MG PO TABS: 1.0000 | ORAL_TABLET | Freq: Four times a day (QID) | ORAL | 0 refills | Status: DC | PRN

## 2024-11-06 ENCOUNTER — Other Ambulatory Visit: Payer: Self-pay

## 2024-11-06 DIAGNOSIS — I4891 Unspecified atrial fibrillation: Secondary | ICD-10-CM

## 2024-11-06 MED ORDER — APIXABAN 2.5 MG PO TABS: 2.5000 mg | ORAL_TABLET | Freq: Two times a day (BID) | ORAL | 3 refills | Status: AC

## 2024-11-25 ENCOUNTER — Other Ambulatory Visit: Payer: Self-pay

## 2024-11-25 DIAGNOSIS — G8929 Other chronic pain: Secondary | ICD-10-CM

## 2024-11-25 MED ORDER — HYDROCODONE-ACETAMINOPHEN 10-325 MG PO TABS: 1.0000 | ORAL_TABLET | Freq: Four times a day (QID) | ORAL | 0 refills | Status: DC | PRN

## 2024-12-21 ENCOUNTER — Ambulatory Visit: Admitting: Family Medicine

## 2024-12-21 ENCOUNTER — Telehealth: Payer: Self-pay | Admitting: Family Medicine

## 2024-12-21 ENCOUNTER — Encounter: Payer: Self-pay | Admitting: Family Medicine

## 2024-12-21 VITALS — BP 124/81 | HR 87 | Ht 59.0 in | Wt 150.0 lb

## 2024-12-21 DIAGNOSIS — J439 Emphysema, unspecified: Secondary | ICD-10-CM | POA: Diagnosis not present

## 2024-12-21 DIAGNOSIS — G8929 Other chronic pain: Secondary | ICD-10-CM

## 2024-12-21 DIAGNOSIS — I5032 Chronic diastolic (congestive) heart failure: Secondary | ICD-10-CM | POA: Diagnosis not present

## 2024-12-21 MED ORDER — ALBUTEROL SULFATE HFA 108 (90 BASE) MCG/ACT IN AERS: 2.0000 | INHALATION_SPRAY | Freq: Four times a day (QID) | RESPIRATORY_TRACT | 2 refills | Status: DC | PRN

## 2024-12-21 MED ORDER — HYDROCODONE-ACETAMINOPHEN 10-325 MG PO TABS: 1.0000 | ORAL_TABLET | Freq: Four times a day (QID) | ORAL | 0 refills | Status: DC | PRN

## 2024-12-21 MED ORDER — ALBUTEROL SULFATE HFA 108 (90 BASE) MCG/ACT IN AERS: 2.0000 | INHALATION_SPRAY | Freq: Four times a day (QID) | RESPIRATORY_TRACT | 2 refills | Status: AC | PRN

## 2024-12-21 NOTE — Progress Notes (Signed)
" ° ° °  SUBJECTIVE:   CHIEF COMPLAINT / HPI: Shortness of breath  Discussed the use of AI scribe software for clinical note transcription with the patient, who gave verbal consent to proceed.  History of Present Illness Nicole Perez is an 89 year old female with atrial fibrillation and a history of stroke who presents with worsening shortness of breath and dizziness.  Dyspnea and orthopnea - Worsening shortness of breath over the past two weeks - Symptoms are most pronounced when lying flat at night (orthopnea) - Heaviness in the chest and difficulty breathing, partially relieved by sitting up - Sensation of something stuck in the throat that cannot be cleared - No improvement with inhaler use; inhaler may be empty - No fever, cough, congestion, or change in cough - Chronic foot swelling managed with as-needed Lasix  - Lasix  reduces swelling but does not improve breathing - No increase in leg swelling beyond baseline foot swelling - Weight gain of approximately five pounds since last visit  Urinary retention and catheter use - Requires catheterization for urinary retention - Granddaughter assists with catheterization; timing is sometimes inconsistent due to work schedule - Fluid management is complicated by catheterization schedule  Atrial fibrillation and cerebrovascular disease - History of atrial fibrillation - History of stroke    PERTINENT  PMH / PSH: Hypertension, dementia, PAD, CHF, A-fib, COPD, peripheral neuropathy, CKD 3B, lumbar stenosis  OBJECTIVE:   BP (!) 140/66   Pulse 87   Ht 4' 11 (1.499 m)   Wt 150 lb (68 kg)   LMP 12/17/1962   SpO2 96%   BMI 30.30 kg/m   Physical Exam General: NAD, well appearing Neuro: A&O Cardiovascular: irregular irregular rhythm, no murmurs  Respiratory: normal WOB on RA, CTAB, no wheezes, mild basilar crackles Abdomen: soft, NTTP, no rebound or guarding Extremities: Moving all 4 extremities equally, trace peripheral  edema   ASSESSMENT/PLAN:   Assessment & Plan Chronic diastolic heart failure (HCC) Most likely cause of dyspnea is increased volume in the setting of HFpEF given orthopnea.  No sick symptoms consistent with viral illness or pneumonia.  No red flags for PE.  Does not meet criteria for COPD exacerbation. - Take Lasix  20mg  once daily for 1 week - Measure weight at home - Take Lasix  before going to bed so daughters can help self cath while home - Follow-up 4 days for weight check and reevaluation - BMP at that time Chronic obstructive pulmonary disease with emphysema, unspecified emphysema type (HCC) Refilled patient's albuterol  inhaler per request.   Return in about 4 days (around 12/25/2024).  Ozell Provencal, MD, PGY-3  Family Medicine 3:12 PM 12/21/2024  [facility]   "

## 2024-12-21 NOTE — Telephone Encounter (Signed)
 Patient request refill of:  Name of Medication(s):  HYDROcodone -acetaminophen  Last date of OV:  12/21/2024 Pharmacy:  [facility], [place], [place]. [address]  Will route refill request to Team CMA.  Discussed with patient policy to call pharmacy for future refills.  Also, discussed refills may take up to 48 hours to approve or deny.  Nicole Perez

## 2024-12-21 NOTE — Patient Instructions (Addendum)
 It was great to see you! Thank you for allowing me to participate in your care!  Our plans for today:   VISIT SUMMARY: Today, we addressed your worsening shortness of breath, dizziness, and other related symptoms. We discussed your chronic conditions, including heart failure, COPD, atrial fibrillation, and the effects of your previous stroke.  YOUR PLAN: CHRONIC DIASTOLIC HEART FAILURE: Your worsening shortness of breath is likely due to fluid retention and pulmonary congestion. -Take Lasix  20mg  daily for one week to manage fluid retention. -Monitor your weight daily to assess fluid status. -Consider a chest x-ray and labs if your symptoms do not improve. -If you have any worsening difficulty breathing please seek immediate care -I have refilled your inhaler.     Please arrive 15 minutes PRIOR to your next scheduled appointment time! If you do not, this affects OTHER patients' care.  Take care and seek immediate care sooner if you develop any concerns.   Ozell Provencal, MD, PGY-3 [facility] 3:20 PM 12/21/2024  [facility]

## 2024-12-21 NOTE — Assessment & Plan Note (Signed)
 Refilled patient's albuterol  inhaler per request.

## 2024-12-21 NOTE — Telephone Encounter (Signed)
 Please let patient know refill sent to her pharmacy this morning

## 2024-12-21 NOTE — Telephone Encounter (Signed)
 Informed the patient's daughter that the medication was sent to the pharmacy

## 2024-12-21 NOTE — Assessment & Plan Note (Addendum)
 Most likely cause of dyspnea is increased volume in the setting of HFpEF given orthopnea.  No sick symptoms consistent with viral illness or pneumonia.  No red flags for PE.  Does not meet criteria for COPD exacerbation. - Take Lasix  20mg  once daily for 1 week - Measure weight at home - Take Lasix  before going to bed so daughters can help self cath while home - Follow-up 4 days for weight check and reevaluation - BMP at that time

## 2024-12-25 ENCOUNTER — Encounter: Payer: Self-pay | Admitting: Family Medicine

## 2024-12-25 ENCOUNTER — Ambulatory Visit: Payer: Self-pay | Admitting: Family Medicine

## 2024-12-25 VITALS — BP 162/85 | Temp 98.0°F | Ht 59.0 in | Wt 144.4 lb

## 2024-12-25 DIAGNOSIS — M150 Primary generalized (osteo)arthritis: Secondary | ICD-10-CM

## 2024-12-25 DIAGNOSIS — I5032 Chronic diastolic (congestive) heart failure: Secondary | ICD-10-CM

## 2024-12-25 NOTE — Progress Notes (Signed)
" ° ° °  SUBJECTIVE:   CHIEF COMPLAINT / HPI: dyspnea f/u  Discussed the use of AI scribe software for clinical note transcription with the patient, who gave verbal consent to proceed.  History of Present Illness Nicole Perez is an 89 year old female with arthritis who presents with difficulty sleeping due to pain.  Arthritis pain - Severe, constant pain attributed to arthritis - Pain has worsened since discontinuation of prior pain medication - Chronic pain present since a past fall that resulted in internal bleeding requiring treatment at [facility]  Insomnia - Difficulty sleeping due to pain - Wide awake all night secondary to pain - Recalls a prior small pill that helped her sleep well but cannot remember the name  Dyspnea - Swelling and breathing have improved since starting Lasix  regimen four days ago - Feels very drained of energy - No current breathing difficulties per sister    PERTINENT  PMH / PSH: Hypertension, dementia, PAD, CHF, A-fib, COPD, peripheral neuropathy, CKD 3B, lumbar stenosis   OBJECTIVE:   BP (!) 162/85   Temp 98 F (36.7 C)   Ht 4' 11 (1.499 m)   Wt 144 lb 6.4 oz (65.5 kg)   LMP 12/17/1962   SpO2 100%   BMI 29.17 kg/m   Physical Exam General: NAD, in wheelchair, well appearing Neuro: A&O Cardiovascular: RRR, no murmurs,  Respiratory: normal WOB on RA, CTAB, no wheezes, ronchi or rales Extremities: Moving all 4 extremities equally, trace peripheral edema   ASSESSMENT/PLAN:   Assessment & Plan Chronic diastolic heart failure (HCC) Volume status euvolemic today. Subjectively breathing has improved. Weight improved today. - Continue prn Lasix  20mg  for orthopnea, lower extremity edema, or rapid weight gain - BMP today Primary osteoarthritis involving multiple joints Causing sleep disturbance due to pain. Okay to try melatonin. Discussed Dr. McDiarmid refilled Norco, and can be picked up tomorrow.     Return in about 2 months (around  02/22/2025) for check in.  Nicole Provencal, MD, PGY-3 [facility] 3:44 PM 12/25/2024  [facility]   "

## 2024-12-25 NOTE — Assessment & Plan Note (Addendum)
 Causing sleep disturbance due to pain. Okay to try melatonin. Discussed Dr. McDiarmid refilled Norco, and can be picked up tomorrow.

## 2024-12-25 NOTE — Patient Instructions (Addendum)
 It was great to see you! Thank you for allowing me to participate in your care!  Our plans for today:   VISIT SUMMARY: Today we discussed your arthritis pain, difficulty sleeping, and swelling in your feet. We also reviewed your chronic heart failure and COPD management.  YOUR PLAN: CHRONIC DIASTOLIC HEART FAILURE: Your breathing has improved and there is no foot swelling, indicating effective fluid management. -We checked your electrolytes and kidney function. -We weighed you to monitor your fluid status. -Take Lasix  if you experience difficulty breathing when lying down or if swelling occurs. -Continue your other heart medicines  PRIMARY OSTEOARTHRITIS INVOLVING MULTIPLE JOINTS: You have severe pain affecting your sleep, and your pain medication was discontinued due to a fall and bladder bleeding. -We refilled your pain medication as prescribed by Dr. Consuela.  CHRONIC OBSTRUCTIVE PULMONARY DISEASE WITH EMPHYSEMA: Your breathing has improved with Lasix  and there is no respiratory distress. -Continue your current management and monitor your respiratory status.     Please arrive 15 minutes PRIOR to your next scheduled appointment time! If you do not, this affects OTHER patients' care.  Take care and seek immediate care sooner if you develop any concerns.   Ozell Provencal, MD, PGY-3 [facility] 3:23 PM 12/25/2024  [facility]

## 2024-12-25 NOTE — Assessment & Plan Note (Addendum)
 Volume status euvolemic today. Subjectively breathing has improved. Weight improved today. - Continue prn Lasix  20mg  for orthopnea, lower extremity edema, or rapid weight gain - BMP today

## 2024-12-26 LAB — BASIC METABOLIC PANEL WITH GFR
BUN/Creatinine Ratio: 27 (ref 12–28)
BUN: 41 mg/dL — ABNORMAL HIGH (ref 8–27)
CO2: 22 mmol/L (ref 20–29)
Calcium: 9.4 mg/dL (ref 8.7–10.3)
Chloride: 101 mmol/L (ref 96–106)
Creatinine, Ser: 1.51 mg/dL — ABNORMAL HIGH (ref 0.57–1.00)
Glucose: 94 mg/dL (ref 70–99)
Potassium: 3.8 mmol/L (ref 3.5–5.2)
Sodium: 140 mmol/L (ref 134–144)
eGFR: 33 mL/min/1.73 — ABNORMAL LOW

## 2024-12-28 ENCOUNTER — Ambulatory Visit: Payer: Self-pay | Admitting: Family Medicine

## 2024-12-28 DIAGNOSIS — I5032 Chronic diastolic (congestive) heart failure: Secondary | ICD-10-CM

## 2025-01-01 NOTE — Addendum Note (Signed)
 Addended by: ALBA SHARPER on: 01/01/2025 09:21 AM   Modules accepted: Orders

## 2025-01-01 NOTE — Telephone Encounter (Signed)
 Nicole Perez leaves VM on nurse line returning call to provider.   I called her back to discuss making a lab apt, however no answer.   Will await her return call.

## 2025-01-11 ENCOUNTER — Telehealth: Payer: Self-pay | Admitting: Pharmacist

## 2025-01-11 NOTE — Telephone Encounter (Signed)
 This patient is appearing on a report for being at risk of failing the adherence measure for cholesterol (statin) medications this calendar year.   Medication: rosuvastatin  Last fill date: 04/2024 for 90 day supply  Reviewed medication indication, dosing, and goals of therapy.    Phone call to daughter who was unsure of current medication list.  She was asked to check if patient taking, had intolerance or wished to restart.  Asked to contact our office to request refill if desired by patient.  I informed her that Dr. McDiarmid had documented her use at last contact and wished to continue.   She verbalized understanding of treatment plan.

## 2025-01-12 NOTE — Telephone Encounter (Signed)
 Reviewed and agree with Dr Rennis plan.

## 2025-01-19 ENCOUNTER — Other Ambulatory Visit: Payer: Self-pay

## 2025-01-19 DIAGNOSIS — G8929 Other chronic pain: Secondary | ICD-10-CM

## 2025-01-20 MED ORDER — HYDROCODONE-ACETAMINOPHEN 10-325 MG PO TABS: 1.0000 | ORAL_TABLET | Freq: Four times a day (QID) | ORAL | 0 refills | Status: AC | PRN

## 2025-02-25 ENCOUNTER — Ambulatory Visit: Admitting: Family Medicine

## 2025-07-12 ENCOUNTER — Encounter
